# Patient Record
Sex: Female | Born: 1940
Health system: Southern US, Community
[De-identification: ages and names within clinical notes are randomized; demographics above are authoritative.]

## PROBLEM LIST (undated history)

## (undated) DIAGNOSIS — I10 Essential (primary) hypertension: Secondary | ICD-10-CM

## (undated) DIAGNOSIS — K922 Gastrointestinal hemorrhage, unspecified: Secondary | ICD-10-CM

## (undated) DIAGNOSIS — J189 Pneumonia, unspecified organism: Secondary | ICD-10-CM

## (undated) DIAGNOSIS — S42309A Unspecified fracture of shaft of humerus, unspecified arm, initial encounter for closed fracture: Secondary | ICD-10-CM

## (undated) DIAGNOSIS — I209 Angina pectoris, unspecified: Secondary | ICD-10-CM

## (undated) DIAGNOSIS — M1990 Unspecified osteoarthritis, unspecified site: Secondary | ICD-10-CM

## (undated) DIAGNOSIS — K449 Diaphragmatic hernia without obstruction or gangrene: Secondary | ICD-10-CM

## (undated) DIAGNOSIS — Z87442 Personal history of urinary calculi: Secondary | ICD-10-CM

## (undated) DIAGNOSIS — F419 Anxiety disorder, unspecified: Secondary | ICD-10-CM

## (undated) DIAGNOSIS — E039 Hypothyroidism, unspecified: Secondary | ICD-10-CM

## (undated) DIAGNOSIS — R7303 Prediabetes: Secondary | ICD-10-CM

## (undated) DIAGNOSIS — D649 Anemia, unspecified: Secondary | ICD-10-CM

## (undated) DIAGNOSIS — F32A Depression, unspecified: Secondary | ICD-10-CM

## (undated) DIAGNOSIS — E119 Type 2 diabetes mellitus without complications: Secondary | ICD-10-CM

## (undated) DIAGNOSIS — I2510 Atherosclerotic heart disease of native coronary artery without angina pectoris: Secondary | ICD-10-CM

## (undated) HISTORY — PX: EYE SURGERY: SHX253

## (undated) HISTORY — PX: TONSILLECTOMY: SUR1361

## (undated) HISTORY — PX: PARATHYROIDECTOMY: SHX19

## (undated) HISTORY — PX: ABDOMINAL HYSTERECTOMY: SHX81

---

## 1999-05-02 ENCOUNTER — Other Ambulatory Visit: Admission: RE | Admit: 1999-05-02 | Discharge: 1999-05-02 | Payer: Self-pay | Admitting: Obstetrics and Gynecology

## 2000-04-30 ENCOUNTER — Other Ambulatory Visit: Admission: RE | Admit: 2000-04-30 | Discharge: 2000-04-30 | Payer: Self-pay | Admitting: Obstetrics and Gynecology

## 2001-05-12 ENCOUNTER — Other Ambulatory Visit: Admission: RE | Admit: 2001-05-12 | Discharge: 2001-05-12 | Payer: Self-pay | Admitting: Obstetrics and Gynecology

## 2002-05-18 ENCOUNTER — Other Ambulatory Visit: Admission: RE | Admit: 2002-05-18 | Discharge: 2002-05-18 | Payer: Self-pay | Admitting: Obstetrics and Gynecology

## 2002-06-20 ENCOUNTER — Encounter: Payer: Self-pay | Admitting: Endocrinology

## 2002-06-20 ENCOUNTER — Ambulatory Visit (HOSPITAL_COMMUNITY): Admission: RE | Admit: 2002-06-20 | Discharge: 2002-06-20 | Payer: Self-pay | Admitting: Endocrinology

## 2003-07-06 ENCOUNTER — Other Ambulatory Visit: Admission: RE | Admit: 2003-07-06 | Discharge: 2003-07-06 | Payer: Self-pay | Admitting: Obstetrics and Gynecology

## 2003-12-31 ENCOUNTER — Ambulatory Visit (HOSPITAL_COMMUNITY): Admission: RE | Admit: 2003-12-31 | Discharge: 2003-12-31 | Payer: Self-pay | Admitting: Endocrinology

## 2004-01-17 ENCOUNTER — Ambulatory Visit (HOSPITAL_COMMUNITY): Admission: RE | Admit: 2004-01-17 | Discharge: 2004-01-17 | Payer: Self-pay | Admitting: Endocrinology

## 2004-03-13 ENCOUNTER — Encounter (INDEPENDENT_AMBULATORY_CARE_PROVIDER_SITE_OTHER): Payer: Self-pay | Admitting: *Deleted

## 2004-03-13 ENCOUNTER — Ambulatory Visit (HOSPITAL_COMMUNITY): Admission: RE | Admit: 2004-03-13 | Discharge: 2004-03-14 | Payer: Self-pay | Admitting: Surgery

## 2004-07-09 ENCOUNTER — Other Ambulatory Visit: Admission: RE | Admit: 2004-07-09 | Discharge: 2004-07-09 | Payer: Self-pay | Admitting: Obstetrics and Gynecology

## 2007-02-28 ENCOUNTER — Encounter: Admission: RE | Admit: 2007-02-28 | Discharge: 2007-02-28 | Payer: Self-pay | Admitting: Specialist

## 2007-07-18 ENCOUNTER — Other Ambulatory Visit: Admission: RE | Admit: 2007-07-18 | Discharge: 2007-07-18 | Payer: Self-pay | Admitting: Obstetrics and Gynecology

## 2008-05-15 ENCOUNTER — Encounter
Admission: RE | Admit: 2008-05-15 | Discharge: 2008-05-15 | Payer: Self-pay | Admitting: Physical Medicine and Rehabilitation

## 2010-12-19 NOTE — Op Note (Signed)
Monique [name], Monique [name]                       ACCOUNT NO.:  [id number]   MEDICAL RECORD NO.:  [id number]                   PATIENT TYPE:  OIB   LOCATION:  5713                                 FACILITY:  [facility]   PHYSICIAN:  Velora Heckler, M.D.                DATE OF BIRTH:  09-24-1940   DATE OF PROCEDURE:  DATE OF DISCHARGE:                                 OPERATIVE REPORT   DATE OF SURGERY:  March 13, 2004.   PREOPERATIVE DIAGNOSIS:  Primary hyperparathyroidism.   POSTOPERATIVE DIAGNOSIS:  Primary hyperparathyroidism.   PROCEDURE:  Minimally invasive parathyroidectomy (right inferior gland).   SURGEON:  Velora Heckler, MD.   ASSISTANT:  Anselm Pancoast. Zachery Dakins, MD.   ANESTHESIA:  Maren Beach, MD,  General.   ESTIMATED BLOOD LOSS:  Minimal.   PREPARATION:  Betadine.   COMPLICATIONS:  None.   INDICATIONS:  The patient is a 70 year old white female referred by Dr. Maryelizabeth Kaufmann with primary hyperparathyroidism.  The patient has had elevated intact  PTH level and serum calcium levels.  She has developed bone and joint pain.  She is on Actonel for osteoporosis.  She has elevated urine calcium levels.  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  MRI scan  confirms a 9-mm mass in the region of the thoracic inlet.  The patient now  comes to surgery for exploration.   The procedure is done in OR #16 at the [facility]  The  patient is brought to the operating room, placed in the supine position on  the operating room table.  Following the administration of general  anesthesia, the patient is positioned and then prepped and draped in the  usual strict aseptic fashion.  After ascertaining that an adequate level of  anesthesia had been obtained, an incision was made in the right inferior  neck.  Dissection was carried down through subcutaneous tissues and  platysma.  Hemostasis is obtained with the electrocautery.  Skin flaps are  developed  cephalad and caudad, and a Weitlaner retractor is placed for  exposure, strap muscles are incised in the midline and reflected laterally.  Inferior pole of the thyroid gland is identified.  The Neo-Probe is used for  detection of radioactivity; however, it does not appear to be terribly  directive in this instance.  Therefore, exploration is performed in the  tracheoesophageal groove.  A parathyroid adenoma is identified in the  thyrothymic tract.  The tract is divided superiorly.  It is then followed  beneath the right head of the clavicle into the anterior mediastinum.  It is  mobilized and transected with the electrocautery below the level of the  adenoma.  On excision, the parathyroid gland measures 1.9 x 0.9 x 0.9 cm in  size.  Frozen section biopsy confirms parathyroid tissue.   Good hemostasis is obtained in the right neck.  Surgicel is placed in the  area of resection.  Strap muscles are reapproximated in the midline with  interrupted 3-0 Vicryl sutures, the platysma closed with interrupted 3-0  Vicryl sutures.  The skin is anesthetized with local anesthetic.  The skin  is closed with a running 4-0 Vicryl subcuticular suture.  Wound is washed  and dried and Steri-Strips are applied.  Sterile dressings are applied.  The  patient is awakened from anesthesia and brought to the recovery room in good  condition.  The patient tolerated the procedure well.                                               Velora Heckler, M.D.    TMG/MEDQ  D:  03/13/2004  T:  03/13/2004  Job:  [id number]   cc:   Reather Littler, M.D.  [address], [address]  [place]  [place] [address]  Fax: [phone]   Maryelizabeth Rowan, M.D.  [facility]  [place], [place] [address]  Fax: [phone]

## 2011-04-02 ENCOUNTER — Other Ambulatory Visit: Payer: Self-pay | Admitting: Orthopedic Surgery

## 2011-04-02 ENCOUNTER — Ambulatory Visit
Admission: RE | Admit: 2011-04-02 | Discharge: 2011-04-02 | Disposition: A | Discharge status: Home / Self Care | Payer: Medicare Other | Source: Ambulatory Visit | Attending: Orthopedic Surgery | Admitting: Orthopedic Surgery

## 2011-04-02 DIAGNOSIS — T148XXA Other injury of unspecified body region, initial encounter: Secondary | ICD-10-CM

## 2011-06-29 ENCOUNTER — Encounter (HOSPITAL_COMMUNITY): Payer: Self-pay | Admitting: Pharmacy Technician

## 2011-06-30 ENCOUNTER — Other Ambulatory Visit: Payer: Self-pay | Admitting: Orthopedic Surgery

## 2011-06-30 NOTE — H&P (Signed)
Monique [name]  DOB: 08/19/1940  Date of Admission:  07/08/2011  Chief complaint:  Right Hip Pain  History of Present Illness The patient is a 70 year old female who comes in today for a preoperative History and Physical. The patient is scheduled for a right total hip arthroplasty to be performed by Dr. Frank V. Aluisio, MD at [facility] on 07/08/2011.  Allergies PENICILLIN. 02/17/2007  Intolerance  Lyrica - Did not help  Medication History Simvastatin (40MG Tablet, Oral at bedtime) Active. Lisinopril (20MG Tablet, Oral every morning) Active. ZyrTEC Allergy (10MG Tablet, Oral daily) Active. Norco (10-325MG Tablet, Oral every six hours, as needed) Active. Flector (1.3% Patch, Transdermal daily) Active. MiraLax ( Oral as needed) Active  Problem List/Past Medical Asthma Hypercholesterolemia Osteoarthritis Ulcer disease Osteoarthrosis shoulder Osteoarthritis Hip Hypertension  Past Surgical History Hysterectomy. complete (non-cancerous) Tonsillectomy Parathyroidectomy Colonoscpy Eyelid Surgery  Family History Cancer. mother Heart Disease. father Heart disease in female family member before age 55 Osteoporosis. Maternal Aunt.  Social History Alcohol use. current drinker; drinks beer, wine and hard liquor; only occasionally per week Children. 2 Current work status. working part time Living situation. live with spouse Marital status. married Tobacco use. never smoker  Review of Systems General:Not Present- Chills, Fever, Night Sweats, Fatigue, Weight Gain, Weight Loss and Memory Loss. Skin:Not Present- Hives, Itching, Rash, Eczema and Lesions. HEENT:Not Present- Tinnitus, Headache, Double Vision, Visual Loss, Hearing Loss and Dentures. Respiratory:Not Present- Shortness of breath with exertion, Shortness of breath at rest, Allergies, Coughing up blood and Chronic Cough. Cardiovascular:Not Present- Chest Pain,  Racing/skipping heartbeats, Difficulty Breathing Lying Down, Murmur, Swelling and Palpitations. Gastrointestinal:Not Present- Bloody Stool, Heartburn, Abdominal Pain, Vomiting, Nausea, Constipation, Diarrhea, Difficulty Swallowing, Jaundice and Loss of appetitie. Female Genitourinary:Present- Incontinence (Mild). Not Present- Blood in Urine, Urinary frequency, Weak urinary stream, Discharge, Flank Pain, Painful Urination, Urgency, Urinary Retention and Urinating at Night. Musculoskeletal:Not Present- Muscle Weakness, Muscle Pain, Joint Swelling, Joint Pain, Back Pain, Morning Stiffness and Spasms. Neurological:Not Present- Tremor, Dizziness, Blackout spells, Paralysis, Difficulty with balance and Weakness. Psychiatric:Not Present- Insomnia.  Vitals Weight: 167 lb Height: 67 in Body Surface Area: 1.89 m Body Mass Index: 26.16 kg/m Pulse: 88 (Regular) Resp.: 16 (Unlabored) BP: 126/82 (Sitting, Right Arm, Standard)  Physical Exam The physical exam findings are as follows:  General Mental Status - Alert, cooperative and good historian. General Appearance- pleasant. Not in acute distress. Orientation- Oriented X3. Build & Nutrition- Well nourished and Well developed.  Head and Neck Head- normocephalic, atraumatic . Neck Global Assessment- supple. no bruit auscultated on the right and no bruit auscultated on the left.  Eye Pupil- Bilateral- Regular and Round. Motion- Bilateral- EOMI.  Chest and Lung Exam Auscultation: Breath sounds:- clear at anterior chest wall and - clear at posterior chest wall. Adventitious sounds:- No Adventitious sounds.  Cardiovascular Auscultation:Rhythm- Regular rate and rhythm. Heart Sounds- S1 WNL and S2 WNL. Murmurs & Other Heart Sounds:Auscultation of the heart reveals - No Murmurs.  Abdomen Palpation/Percussion:Tenderness- Abdomen is non-tender to palpation. Rigidity (guarding)-   Abdomen is  soft. Auscultation:Auscultation of the abdomen reveals - Bowel sounds normal. Note: Slightly round, soft  Female Genitourinary Note: Not done, not pertinent to present illness  Musculoskeletal Note: Well-developed female, alert and oriented, in no apparent distress. She has a significantly antalgic gait pattern with a significant abductor lurch on the right. Right hip can be flexed to 90. No internal or external rotation. No abduction or adduction. Left hip has normal ROM. Right knee exam   is unremarkable. Pulses, sensation and motor are intact, both lower extremities.  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 She has excellent bone stock. She has a rather small acetabulum. She has protrusio, but it is not all the way into the pelvis.  Assessment & Plan Osteoarthritis Right Hip  Note: Plan is for a Right Total Hip Replacement by Dr. Aluisio. Plan is to go to home following surgey.  Drew Burlin Mcnair, PA-C   

## 2011-07-02 ENCOUNTER — Ambulatory Visit (HOSPITAL_COMMUNITY)
Admission: RE | Admit: 2011-07-02 | Discharge: 2011-07-02 | Disposition: A | Discharge status: Home / Self Care | Payer: Medicare Other | Source: Ambulatory Visit | Attending: Orthopedic Surgery | Admitting: Orthopedic Surgery

## 2011-07-02 ENCOUNTER — Encounter (HOSPITAL_COMMUNITY)
Admission: RE | Admit: 2011-07-02 | Discharge: 2011-07-02 | Disposition: A | Discharge status: Home / Self Care | Payer: Medicare Other | Source: Ambulatory Visit | Attending: Orthopedic Surgery | Admitting: Orthopedic Surgery

## 2011-07-02 ENCOUNTER — Encounter (HOSPITAL_COMMUNITY): Payer: Self-pay

## 2011-07-02 DIAGNOSIS — M25559 Pain in unspecified hip: Secondary | ICD-10-CM | POA: Insufficient documentation

## 2011-07-02 DIAGNOSIS — M1610 Unilateral primary osteoarthritis, unspecified hip: Secondary | ICD-10-CM | POA: Insufficient documentation

## 2011-07-02 DIAGNOSIS — Z01818 Encounter for other preprocedural examination: Secondary | ICD-10-CM | POA: Insufficient documentation

## 2011-07-02 DIAGNOSIS — Z01812 Encounter for preprocedural laboratory examination: Secondary | ICD-10-CM | POA: Insufficient documentation

## 2011-07-02 DIAGNOSIS — M169 Osteoarthritis of hip, unspecified: Secondary | ICD-10-CM | POA: Insufficient documentation

## 2011-07-02 DIAGNOSIS — M948X9 Other specified disorders of cartilage, unspecified sites: Secondary | ICD-10-CM | POA: Insufficient documentation

## 2011-07-02 HISTORY — DX: Anemia, unspecified: D64.9

## 2011-07-02 HISTORY — DX: Unspecified osteoarthritis, unspecified site: M19.90

## 2011-07-02 HISTORY — DX: Essential (primary) hypertension: I10

## 2011-07-02 HISTORY — DX: Pneumonia, unspecified organism: J18.9

## 2011-07-02 HISTORY — DX: Diaphragmatic hernia without obstruction or gangrene: K44.9

## 2011-07-02 LAB — APTT: aPTT: 32 seconds (ref 24–37)

## 2011-07-02 LAB — SURGICAL PCR SCREEN: MRSA, PCR: NEGATIVE

## 2011-07-02 MED ORDER — CHLORHEXIDINE GLUCONATE 4 % EX LIQD: 60.0000 mL | Freq: Once | CUTANEOUS | Status: DC

## 2011-07-02 NOTE — Pre-Procedure Instructions (Addendum)
Labs from 06/18/11 PCP and EKG on chart

## 2011-07-02 NOTE — Patient Instructions (Signed)
20 Monique [name]  07/02/2011   Your procedure is scheduled on: 07/08/11  Wednesday  Surgery [id number]  Report to [facility] at  0600 AM.  Call this number if you have problems the morning of surgery: [phone]              PST  [name] [id number]  Remember:   Do not eat food:After Midnight.  Tuesday night  May have clear liquids:until Midnight   Tuesday NIGHT.  Clear liquids include soda, tea, black coffee, apple or grape juice, broth.  Take these medicines the morning of surgery with A SIP OF WATER:  Norco if needed with sip water   Do not wear jewelry, make-up or nail polish.  Do not wear lotions, powders, or perfumes. You may wear deodorant.  Do not shave 48 hours prior to surgery.  Do not bring valuables to the hospital.  Contacts, dentures or bridgework may not be worn into surgery.  Leave suitcase in the car. After surgery it may be brought to your room.  For patients admitted to the hospital, checkout time is 11:00 AM the day of discharge.   Patients discharged the day of surgery will not be allowed to drive home.  Name and phone number of your driver: husband  Special Instructions: CHG Shower Use Special Wash: 1/2 bottle night before surgery and 1/2 bottle morning of surgery.Regular soap face or privates  No shaving x 48 hrs   Please read over the following fact sheets that you were given: MRSA Information

## 2011-07-07 MED ORDER — BUPIVACAINE 0.25 % ON-Q PUMP SINGLE CATH 300ML
300.0000 mL | INJECTION | Status: DC
  Filled 2011-07-07: qty 300

## 2011-07-07 NOTE — Anesthesia Preprocedure Evaluation (Addendum)
Anesthesia Evaluation  Patient identified by MRN, date of birth, ID band Patient awake    Reviewed: Allergy & Precautions, H&P , NPO status , Patient's Chart, lab work & pertinent test results  Airway Mallampati: II TM Distance: >3 FB Neck ROM: full    Dental No notable dental hx. (+) Teeth Intact and Dental Advisory Given   Pulmonary neg pulmonary ROS, asthma ,  Asthma very controlled clear to auscultation  Pulmonary exam normal       Cardiovascular Exercise Tolerance: Good hypertension, On Medications neg cardio ROS regular Normal    Neuro/Psych Negative Neurological ROS  Negative Psych ROS   GI/Hepatic negative GI ROS, Neg liver ROS, hiatal hernia,   Endo/Other  Negative Endocrine ROS  Renal/GU negative Renal ROS  Genitourinary negative   Musculoskeletal   Abdominal   Peds  Hematology negative hematology ROS (+)   Anesthesia Other Findings   Reproductive/Obstetrics negative OB ROS                          Anesthesia Physical Anesthesia Plan  ASA: III  Anesthesia Plan: General   Post-op Pain Management:    Induction: Intravenous  Airway Management Planned: Oral ETT  Additional Equipment:   Intra-op Plan:   Post-operative Plan: Extubation in OR  Informed Consent: I have reviewed the patients History and Physical, chart, labs and discussed the procedure including the risks, benefits and alternatives for the proposed anesthesia with the patient or authorized representative who has indicated his/her understanding and acceptance.   Dental Advisory Given  Plan Discussed with: CRNA and Surgeon  Anesthesia Plan Comments:        Anesthesia Quick Evaluation

## 2011-07-08 ENCOUNTER — Inpatient Hospital Stay (HOSPITAL_COMMUNITY): Payer: Medicare Other

## 2011-07-08 ENCOUNTER — Encounter (HOSPITAL_COMMUNITY)
Admission: RE | Disposition: A | Discharge status: Home Health | Payer: Self-pay | Source: Ambulatory Visit | Attending: Orthopedic Surgery

## 2011-07-08 ENCOUNTER — Encounter (HOSPITAL_COMMUNITY): Payer: Self-pay | Admitting: Anesthesiology

## 2011-07-08 ENCOUNTER — Encounter (HOSPITAL_COMMUNITY): Payer: Self-pay | Admitting: Orthopedic Surgery

## 2011-07-08 ENCOUNTER — Inpatient Hospital Stay (HOSPITAL_COMMUNITY)
Admission: RE | Admit: 2011-07-08 | Discharge: 2011-07-11 | DRG: 470 | Disposition: A | Discharge status: Home Health | Payer: Medicare Other | Source: Ambulatory Visit | Attending: Orthopedic Surgery | Admitting: Orthopedic Surgery

## 2011-07-08 ENCOUNTER — Inpatient Hospital Stay (HOSPITAL_COMMUNITY): Payer: Medicare Other | Admitting: Anesthesiology

## 2011-07-08 DIAGNOSIS — K449 Diaphragmatic hernia without obstruction or gangrene: Secondary | ICD-10-CM | POA: Diagnosis present

## 2011-07-08 DIAGNOSIS — M1611 Unilateral primary osteoarthritis, right hip: Secondary | ICD-10-CM | POA: Diagnosis present

## 2011-07-08 DIAGNOSIS — M169 Osteoarthritis of hip, unspecified: Principal | ICD-10-CM | POA: Diagnosis present

## 2011-07-08 DIAGNOSIS — I10 Essential (primary) hypertension: Secondary | ICD-10-CM | POA: Diagnosis present

## 2011-07-08 DIAGNOSIS — E876 Hypokalemia: Secondary | ICD-10-CM | POA: Diagnosis not present

## 2011-07-08 DIAGNOSIS — M1610 Unilateral primary osteoarthritis, unspecified hip: Principal | ICD-10-CM | POA: Diagnosis present

## 2011-07-08 HISTORY — PX: TOTAL HIP ARTHROPLASTY: SHX124

## 2011-07-08 LAB — ABO/RH: ABO/RH(D): A POS

## 2011-07-08 LAB — TYPE AND SCREEN

## 2011-07-08 SURGERY — ARTHROPLASTY, HIP, TOTAL,POSTERIOR APPROACH
Anesthesia: General | Site: Hip | Laterality: Right | Wound class: Clean

## 2011-07-08 MED ORDER — LACTATED RINGERS IV SOLN
INTRAVENOUS | Status: DC | PRN
  Administered 2011-07-08: 1000 mL
  Administered 2011-07-08: 09:00:00 via INTRAVENOUS

## 2011-07-08 MED ORDER — MENTHOL 3 MG MT LOZG: 1.0000 | LOZENGE | OROMUCOSAL | Status: DC | PRN

## 2011-07-08 MED ORDER — DOCUSATE SODIUM 100 MG PO CAPS
100.0000 mg | ORAL_CAPSULE | Freq: Two times a day (BID) | ORAL | Status: DC
  Administered 2011-07-09 – 2011-07-11 (×6): 100 mg via ORAL
  Filled 2011-07-08 (×8): qty 1

## 2011-07-08 MED ORDER — ALUM & MAG HYDROXIDE-SIMETH 200-200-20 MG/5ML PO SUSP
30.0000 mL | Freq: Four times a day (QID) | ORAL | Status: DC | PRN
  Administered 2011-07-08: 30 mL via ORAL
  Filled 2011-07-08: qty 30

## 2011-07-08 MED ORDER — SUCCINYLCHOLINE CHLORIDE 20 MG/ML IJ SOLN
INTRAMUSCULAR | Status: DC | PRN
  Administered 2011-07-08: 100 mg via INTRAVENOUS

## 2011-07-08 MED ORDER — FENTANYL CITRATE 0.05 MG/ML IJ SOLN
50.0000 ug | INTRAMUSCULAR | Status: DC | PRN
  Administered 2011-07-08: 25 ug via INTRAVENOUS

## 2011-07-08 MED ORDER — ONDANSETRON HCL 4 MG PO TABS
4.0000 mg | ORAL_TABLET | Freq: Four times a day (QID) | ORAL | Status: DC | PRN
  Administered 2011-07-08 – 2011-07-09 (×2): 4 mg via ORAL
  Filled 2011-07-08 (×2): qty 1

## 2011-07-08 MED ORDER — FENTANYL CITRATE 0.05 MG/ML IJ SOLN
INTRAMUSCULAR | Status: DC | PRN
Start: 2011-07-08 — End: 2011-07-08
  Administered 2011-07-08 (×3): 50 ug via INTRAVENOUS
  Administered 2011-07-08 (×3): 25 ug via INTRAVENOUS
  Administered 2011-07-08: 50 ug via INTRAVENOUS
  Administered 2011-07-08: 75 ug via INTRAVENOUS
  Administered 2011-07-08 (×2): 50 ug via INTRAVENOUS
  Administered 2011-07-08: 25 ug
  Administered 2011-07-08: 50 ug via INTRAVENOUS
  Administered 2011-07-08: 75 ug via INTRAVENOUS

## 2011-07-08 MED ORDER — MIDAZOLAM HCL 5 MG/5ML IJ SOLN
INTRAMUSCULAR | Status: DC | PRN
  Administered 2011-07-08: 1 mg via INTRAVENOUS

## 2011-07-08 MED ORDER — ACETAMINOPHEN 325 MG PO TABS: 650.0000 mg | ORAL_TABLET | Freq: Four times a day (QID) | ORAL | Status: DC | PRN

## 2011-07-08 MED ORDER — METOCLOPRAMIDE HCL 10 MG PO TABS: 5.0000 mg | ORAL_TABLET | Freq: Three times a day (TID) | ORAL | Status: DC | PRN

## 2011-07-08 MED ORDER — METOCLOPRAMIDE HCL 5 MG/ML IJ SOLN
5.0000 mg | Freq: Three times a day (TID) | INTRAMUSCULAR | Status: DC | PRN
  Administered 2011-07-08: 10 mg via INTRAVENOUS
  Filled 2011-07-08: qty 2

## 2011-07-08 MED ORDER — RIVAROXABAN 10 MG PO TABS
10.0000 mg | ORAL_TABLET | Freq: Every day | ORAL | Status: DC
  Administered 2011-07-09 – 2011-07-11 (×3): 10 mg via ORAL
  Filled 2011-07-08 (×3): qty 1

## 2011-07-08 MED ORDER — METOPROLOL TARTRATE 1 MG/ML IV SOLN
2.5000 mg | INTRAVENOUS | Status: AC
  Administered 2011-07-08: 2.5 mg via INTRAVENOUS

## 2011-07-08 MED ORDER — LIDOCAINE HCL (CARDIAC) 20 MG/ML IV SOLN
INTRAVENOUS | Status: DC | PRN
  Administered 2011-07-08: 20 mg via INTRAVENOUS

## 2011-07-08 MED ORDER — PROMETHAZINE HCL 25 MG/ML IJ SOLN: 6.2500 mg | INTRAMUSCULAR | Status: DC | PRN

## 2011-07-08 MED ORDER — METHOCARBAMOL 100 MG/ML IJ SOLN
500.0000 mg | Freq: Four times a day (QID) | INTRAVENOUS | Status: DC | PRN
  Administered 2011-07-08: 500 mg via INTRAVENOUS
  Filled 2011-07-08: qty 5

## 2011-07-08 MED ORDER — KETAMINE HCL 10 MG/ML IJ SOLN
INTRAMUSCULAR | Status: DC | PRN
  Administered 2011-07-08 (×4): 5 mg via INTRAVENOUS

## 2011-07-08 MED ORDER — PROPOFOL 10 MG/ML IV EMUL
INTRAVENOUS | Status: DC | PRN
  Administered 2011-07-08: 150 mg via INTRAVENOUS

## 2011-07-08 MED ORDER — NEOSTIGMINE METHYLSULFATE 1 MG/ML IJ SOLN
INTRAMUSCULAR | Status: DC | PRN
  Administered 2011-07-08: 3 mg via INTRAVENOUS

## 2011-07-08 MED ORDER — TEMAZEPAM 15 MG PO CAPS: 15.0000 mg | ORAL_CAPSULE | Freq: Every evening | ORAL | Status: DC | PRN

## 2011-07-08 MED ORDER — ESMOLOL HCL 10 MG/ML IV SOLN
INTRAVENOUS | Status: DC | PRN
  Administered 2011-07-08: 20 mg via INTRAVENOUS
  Administered 2011-07-08 (×2): 5 mg via INTRAVENOUS
  Administered 2011-07-08: 20 mg via INTRAVENOUS
  Administered 2011-07-08: 5 mg via INTRAVENOUS

## 2011-07-08 MED ORDER — HYDROMORPHONE HCL PF 1 MG/ML IJ SOLN
0.2500 mg | INTRAMUSCULAR | Status: DC | PRN
  Administered 2011-07-08 (×3): 0.5 mg via INTRAVENOUS

## 2011-07-08 MED ORDER — LORATADINE 10 MG PO TABS
10.0000 mg | ORAL_TABLET | Freq: Every day | ORAL | Status: DC
  Administered 2011-07-09 – 2011-07-11 (×3): 10 mg via ORAL
  Filled 2011-07-08 (×4): qty 1

## 2011-07-08 MED ORDER — ACETAMINOPHEN 10 MG/ML IV SOLN
INTRAVENOUS | Status: DC | PRN
  Administered 2011-07-08: 1000 mg via INTRAVENOUS

## 2011-07-08 MED ORDER — ACETAMINOPHEN 10 MG/ML IV SOLN
1000.0000 mg | Freq: Four times a day (QID) | INTRAVENOUS | Status: AC
  Administered 2011-07-08 – 2011-07-09 (×3): 1000 mg via INTRAVENOUS
  Filled 2011-07-08 (×3): qty 100

## 2011-07-08 MED ORDER — VANCOMYCIN HCL IN DEXTROSE 1-5 GM/200ML-% IV SOLN
1000.0000 mg | Freq: Two times a day (BID) | INTRAVENOUS | Status: AC
  Administered 2011-07-08: 1000 mg via INTRAVENOUS
  Filled 2011-07-08: qty 200

## 2011-07-08 MED ORDER — LACTATED RINGERS IV SOLN: INTRAVENOUS | Status: DC

## 2011-07-08 MED ORDER — METHOCARBAMOL 500 MG PO TABS
500.0000 mg | ORAL_TABLET | Freq: Four times a day (QID) | ORAL | Status: DC | PRN
  Administered 2011-07-09: 500 mg via ORAL
  Filled 2011-07-08: qty 1

## 2011-07-08 MED ORDER — HYDROMORPHONE HCL 2 MG PO TABS
2.0000 mg | ORAL_TABLET | ORAL | Status: DC | PRN
  Administered 2011-07-08: 2 mg via ORAL
  Administered 2011-07-09: 4 mg via ORAL
  Administered 2011-07-09: 2 mg via ORAL
  Administered 2011-07-09 (×2): 4 mg via ORAL
  Administered 2011-07-09: 2 mg via ORAL
  Administered 2011-07-10 – 2011-07-11 (×9): 4 mg via ORAL
  Filled 2011-07-08: qty 2
  Filled 2011-07-08: qty 1
  Filled 2011-07-08 (×2): qty 2
  Filled 2011-07-08: qty 1
  Filled 2011-07-08 (×4): qty 2
  Filled 2011-07-08: qty 1
  Filled 2011-07-08 (×6): qty 2

## 2011-07-08 MED ORDER — ACETAMINOPHEN 650 MG RE SUPP: 650.0000 mg | Freq: Four times a day (QID) | RECTAL | Status: DC | PRN

## 2011-07-08 MED ORDER — CISATRACURIUM BESYLATE 2 MG/ML IV SOLN
INTRAVENOUS | Status: DC | PRN
  Administered 2011-07-08: 5 mg via INTRAVENOUS

## 2011-07-08 MED ORDER — LACTATED RINGERS IV SOLN
INTRAVENOUS | Status: DC | PRN
  Administered 2011-07-08: 08:00:00 via INTRAVENOUS

## 2011-07-08 MED ORDER — DIPHENHYDRAMINE HCL 12.5 MG/5ML PO ELIX: 12.5000 mg | ORAL_SOLUTION | ORAL | Status: DC | PRN

## 2011-07-08 MED ORDER — SIMVASTATIN 40 MG PO TABS
40.0000 mg | ORAL_TABLET | Freq: Every day | ORAL | Status: DC
  Administered 2011-07-09 – 2011-07-10 (×2): 40 mg via ORAL
  Filled 2011-07-08 (×4): qty 1

## 2011-07-08 MED ORDER — SODIUM CHLORIDE 0.9 % IR SOLN
Status: DC | PRN
  Administered 2011-07-08: 1000 mL

## 2011-07-08 MED ORDER — HETASTARCH-ELECTROLYTES 6 % IV SOLN
INTRAVENOUS | Status: DC | PRN
  Administered 2011-07-08: 09:00:00 via INTRAVENOUS

## 2011-07-08 MED ORDER — MORPHINE SULFATE 2 MG/ML IJ SOLN
1.0000 mg | INTRAMUSCULAR | Status: DC | PRN
  Administered 2011-07-08 – 2011-07-09 (×4): 2 mg via INTRAVENOUS
  Filled 2011-07-08 (×4): qty 1

## 2011-07-08 MED ORDER — GLYCOPYRROLATE 0.2 MG/ML IJ SOLN
INTRAMUSCULAR | Status: DC | PRN
  Administered 2011-07-08: .4 mg via INTRAVENOUS

## 2011-07-08 MED ORDER — ONDANSETRON HCL 4 MG/2ML IJ SOLN
4.0000 mg | Freq: Four times a day (QID) | INTRAMUSCULAR | Status: DC | PRN
  Administered 2011-07-08: 4 mg via INTRAVENOUS
  Filled 2011-07-08: qty 2

## 2011-07-08 MED ORDER — SODIUM CHLORIDE 0.9 % IV SOLN
INTRAVENOUS | Status: DC
  Administered 2011-07-08 – 2011-07-09 (×4): via INTRAVENOUS

## 2011-07-08 MED ORDER — HYDROMORPHONE HCL PF 1 MG/ML IJ SOLN
0.2500 mg | INTRAMUSCULAR | Status: DC | PRN
  Administered 2011-07-08: 0.5 mg via INTRAVENOUS

## 2011-07-08 MED ORDER — VANCOMYCIN HCL IN DEXTROSE 1-5 GM/200ML-% IV SOLN
1000.0000 mg | Freq: Once | INTRAVENOUS | Status: AC
  Administered 2011-07-08: 1000 mg via INTRAVENOUS
  Filled 2011-07-08: qty 200

## 2011-07-08 MED ORDER — PHENOL 1.4 % MT LIQD: 1.0000 | OROMUCOSAL | Status: DC | PRN

## 2011-07-08 MED ORDER — BUPIVACAINE LIPOSOME 1.3 % IJ SUSP
20.0000 mL | INTRAMUSCULAR | Status: AC
  Administered 2011-07-08: 20 mL
  Filled 2011-07-08: qty 20

## 2011-07-08 SURGICAL SUPPLY — 50 items
BAG ZIPLOCK 12X15 (MISCELLANEOUS) ×2 IMPLANT
BIT DRILL 2.8X128 (BIT) ×2 IMPLANT
BLADE EXTENDED COATED 6.5IN (ELECTRODE) ×2 IMPLANT
BLADE SAW SAG 73X25 THK (BLADE) ×1
BLADE SAW SGTL 73X25 THK (BLADE) ×1 IMPLANT
CLOSURE STERI STRIP 1/2 X4 (GAUZE/BANDAGES/DRESSINGS) ×2 IMPLANT
CLOTH BEACON ORANGE TIMEOUT ST (SAFETY) ×2 IMPLANT
DRAPE INCISE IOBAN 66X45 STRL (DRAPES) ×2 IMPLANT
DRAPE ORTHO SPLIT 77X108 STRL (DRAPES) ×2
DRAPE POUCH INSTRU U-SHP 10X18 (DRAPES) ×2 IMPLANT
DRAPE SURG ORHT 6 SPLT 77X108 (DRAPES) ×2 IMPLANT
DRAPE U-SHAPE 47X51 STRL (DRAPES) ×2 IMPLANT
DRSG ADAPTIC 3X8 NADH LF (GAUZE/BANDAGES/DRESSINGS) ×2 IMPLANT
DRSG MEPILEX BORDER 4X4 (GAUZE/BANDAGES/DRESSINGS) ×2 IMPLANT
DRSG MEPILEX BORDER 4X8 (GAUZE/BANDAGES/DRESSINGS) ×2 IMPLANT
DURAPREP 26ML APPLICATOR (WOUND CARE) ×2 IMPLANT
ELECT REM PT RETURN 9FT ADLT (ELECTROSURGICAL) ×2
ELECTRODE REM PT RTRN 9FT ADLT (ELECTROSURGICAL) ×1 IMPLANT
EVACUATOR 1/8 PVC DRAIN (DRAIN) ×2 IMPLANT
FACESHIELD LNG OPTICON STERILE (SAFETY) ×8 IMPLANT
GLOVE BIO SURGEON STRL SZ7.5 (GLOVE) ×2 IMPLANT
GLOVE BIO SURGEON STRL SZ8 (GLOVE) ×2 IMPLANT
GLOVE BIOGEL PI IND STRL 8 (GLOVE) ×2 IMPLANT
GLOVE BIOGEL PI INDICATOR 8 (GLOVE) ×2
GOWN STRL NON-REIN LRG LVL3 (GOWN DISPOSABLE) ×2 IMPLANT
GOWN STRL REIN XL XLG (GOWN DISPOSABLE) ×2 IMPLANT
IMMOBILIZER KNEE 20 (SOFTGOODS) ×2
IMMOBILIZER KNEE 20 THIGH 36 (SOFTGOODS) ×1 IMPLANT
KIT BASIN OR (CUSTOM PROCEDURE TRAY) ×2 IMPLANT
MANIFOLD NEPTUNE II (INSTRUMENTS) ×2 IMPLANT
NDL SAFETY ECLIPSE 18X1.5 (NEEDLE) ×1 IMPLANT
NEEDLE HYPO 18GX1.5 SHARP (NEEDLE) ×1
NS IRRIG 1000ML POUR BTL (IV SOLUTION) ×2 IMPLANT
PACK TOTAL JOINT (CUSTOM PROCEDURE TRAY) ×2 IMPLANT
PASSER SUT SWANSON 36MM LOOP (INSTRUMENTS) ×2 IMPLANT
POSITIONER SURGICAL ARM (MISCELLANEOUS) ×2 IMPLANT
SPONGE GAUZE 4X4 12PLY (GAUZE/BANDAGES/DRESSINGS) ×2 IMPLANT
SPONGE GAUZE 4X4 FOR O.R. (GAUZE/BANDAGES/DRESSINGS) ×2 IMPLANT
STRIP CLOSURE SKIN 1/2X4 (GAUZE/BANDAGES/DRESSINGS) ×4 IMPLANT
SUT ETHIBOND NAB CT1 #1 30IN (SUTURE) ×4 IMPLANT
SUT MNCRL AB 4-0 PS2 18 (SUTURE) ×2 IMPLANT
SUT VIC AB 1 CT1 27 (SUTURE) ×3
SUT VIC AB 1 CT1 27XBRD ANTBC (SUTURE) ×3 IMPLANT
SUT VIC AB 2-0 CT1 27 (SUTURE) ×3
SUT VIC AB 2-0 CT1 TAPERPNT 27 (SUTURE) ×3 IMPLANT
SYR 50ML LL SCALE MARK (SYRINGE) ×2 IMPLANT
TOWEL OR 17X26 10 PK STRL BLUE (TOWEL DISPOSABLE) ×4 IMPLANT
TOWEL OR NON WOVEN STRL DISP B (DISPOSABLE) ×2 IMPLANT
TRAY FOLEY CATH 14FRSI W/METER (CATHETERS) ×2 IMPLANT
WATER STERILE IRR 1500ML POUR (IV SOLUTION) ×2 IMPLANT

## 2011-07-08 NOTE — Transfer of Care (Signed)
Immediate Anesthesia Transfer of Care Note  Patient: Monique [name]  Procedure(s) Performed:  TOTAL HIP ARTHROPLASTY  Patient Location: PACU  Anesthesia Type: General  Level of Consciousness: awake  Airway & Oxygen Therapy: Patient Spontanous Breathing and Patient connected to face mask  Post-op Assessment: Report given to PACU RN and Post -op Vital signs reviewed and stable  Post vital signs: Reviewed and stable  Complications: No apparent anesthesia complications

## 2011-07-08 NOTE — Op Note (Signed)
Pre-operative diagnosis- Osteoarthritis Right hip  Post-operative diagnosis- Osteoarthritis  Right hip  Procedure-  RightTotal Hip Arthroplasty  Surgeon- Monique Rankin. Malaya Cagley, MD  Assistant- Avel Peace, PA-C   Anesthesia  General  EBL- 400   Drain Hemovac   Complication- None  Condition-PACU - hemodynamically stable.   Brief Clinical Note-  Monique [name] is a 70 y.o. female with end stage arthritis of her right hip with progressively worsening pain and dysfunction. Pain occurs with activity and rest including pain at night. She has tried analgesics, protected weight bearing and rest without benefit. Pain is too severe to attempt physical therapy. Radiographs demonstrate bone on bone arthritis with subchondral cyst formation. She presents now for right THA.  Procedure in detail-   The patient is brought into the operating room and placed on the operating table. After successful administration of General  anesthesia, the patient is placed in the  Left lateral decubitus position with the  Right side up and held in place with the hip positioner. The lower extremity is isolated from the perineum with plastic drapes and time-out is performed by the surgical team. The lower extremity is then prepped and draped in the usual sterile fashion. A short posterolateral incision is made with a ten blade through the subcutaneous tissue to the level of the fascia lata which is incised in line with the skin incision. The sciatic nerve is palpated and protected and the short external rotators and capsule are isolated from the femur. The hip is then dislocated and the center of the femoral head is marked. A trial prosthesis is placed such that the trial head corresponds to the center of the patients' native femoral head. The resection level is marked on the femoral neck and the resection is made with an oscillating saw. The femoral head is removed and femoral retractors placed to gain access to the femoral  canal.      The canal finder is passed into the femoral canal and the canal is thoroughly irrigated with sterile saline to remove the fatty contents. Axial reaming is performed to 11.5  mm, proximal reaming to 16D  and the sleeve machined to a small. A 16D small trial sleeve is placed into the proximal femur.      The femur is then retracted anteriorly to gain acetabular exposure. Acetabular retractors are placed and the labrum and osteophytes are removed, Acetabular reaming is performed to 49  mm and a 50  mm Pinnacle acetabular shell is placed in anatomic position with excellent purchase. Additional dome screws were placed. An apex hole eliminator is placed and the permanent 32 mm neutral plus 4 Marathon liner is placed into the acetabular shell.      The trial femur is then placed into the femoral canal. The size is 16 x 11  stem with a 36 + 6  neck and a 32 + 0 head with the neck version matching  the patients' native anteversion. The hip is reduced with excellent stability with full extension and full external rotation, 70 degrees flexion with 40 degrees adduction and 90 degrees internal rotation and 90 degrees of flexion with 70 degrees of internal rotation. The operative leg is placed on top of the non-operative leg and the leg lengths are found to be equal. The trials are then removed and the permanent implant of the same size is impacted into the femoral canal. The ceramic femoral head of the same size as the trial is placed and the hip is  reduced with the same stability parameters. The operative leg is again placed on top of the non-operative leg and the leg lengths are found to be equal.      The wound is then copiously irrigated with saline solution and the capsule and short external rotators are re-attached to the femur through drill holes with Ethibond suture. The fascia lata is closed over a hemovac drain with #1 vicryl suture and the fascia lata, gluteal muscles and subcutaneous tissues are  injected with Exparel 20ml diluted with saline 50ml. The subcutaneous tissues are closed with #1 and2-0 vicryl and the subcuticular layer closed with running 4-0 Monocryl. The drain is hooked to suction, incision cleaned and dried, and steri-srips and a bulky sterile dressing applied. The limb is placed into a knee immobilizer and the patient is awakened and transported to recovery in stable condition.      Please note that a surgical assistant was a medical necessity for this procedure in order to perform it in a safe and expeditious manner. The assistant was necessary to provide retraction to the vital neurovascular structures and to retract and position the limb to allow for anatomic placement of the prosthetic components.  Monique Rankin Natale Thoma, MD    07/08/2011, 9:42 AM

## 2011-07-08 NOTE — Anesthesia Postprocedure Evaluation (Signed)
  Anesthesia Post-op Note  Patient: Monique [name]  Procedure(s) Performed:  TOTAL HIP ARTHROPLASTY  Patient Location: PACU  Anesthesia Type: General  Level of Consciousness: awake and alert   Airway and Oxygen Therapy: Patient Spontanous Breathing  Post-op Pain: mild  Post-op Assessment: Post-op Vital signs reviewed, Patient's Cardiovascular Status Stable, Respiratory Function Stable, Patent Airway and No signs of Nausea or vomiting  Post-op Vital Signs: stable  Complications: No apparent anesthesia complications

## 2011-07-08 NOTE — Progress Notes (Signed)
DR EWELL   CAME TO UNIT AFTER PAGE:  NOTIFIED OF HEART RATE  115-125  BP 175/70--NOT RELIEVED WITH PAIN MEDICATION.  PAIN RELIEF NOT ACHEIEVED WITH 2MG  DILAUDID AND 500 ROBAXIN-ORDERS RECEIVED.

## 2011-07-08 NOTE — Interval H&P Note (Signed)
History and Physical Interval Note:  07/08/2011 7:57 AM  Monique [name]  has presented today for surgery, with the diagnosis of Osteoarthritis Right Hip  The various methods of treatment have been discussed with the patient and family. After consideration of risks, benefits and other options for treatment, the patient has consented to  Procedure(s): TOTAL HIP ARTHROPLASTY as a surgical intervention .  The patients' history has been reviewed, patient examined, no change in status, stable for surgery.  I have reviewed the patients' chart and labs.  Questions were answered to the patient's satisfaction.     Loanne Drilling

## 2011-07-08 NOTE — H&P (View-Only) (Signed)
Monique [name]  DOB: 09-Aug-1940  Date of Admission:  07/08/2011  Chief complaint:  Right Hip Pain  History of Present Illness The patient is a 70 year old female who comes in today for a preoperative History and Physical. The patient is scheduled for a right total hip arthroplasty to be performed by Dr. Gus Rankin. Aluisio, MD at [facility] on 07/08/2011.  Allergies PENICILLIN. 02/17/2007  Intolerance  Lyrica - Did not help  Medication History Simvastatin (40MG  Tablet, Oral at bedtime) Active. Lisinopril (20MG  Tablet, Oral every morning) Active. ZyrTEC Allergy (10MG  Tablet, Oral daily) Active. Norco (10-325MG  Tablet, Oral every six hours, as needed) Active. Flector (1.3% Patch, Transdermal daily) Active. MiraLax ( Oral as needed) Active  Problem List/Past Medical Asthma Hypercholesterolemia Osteoarthritis Ulcer disease Osteoarthrosis shoulder Osteoarthritis Hip Hypertension  Past Surgical History Hysterectomy. complete (non-cancerous) Tonsillectomy Parathyroidectomy Colonoscpy Eyelid Surgery  Family History Cancer. mother Heart Disease. father Heart disease in female family member before age 23 Osteoporosis. Maternal Aunt.  Social History Alcohol use. current drinker; drinks beer, wine and hard liquor; only occasionally per week Children. 2 Current work status. working part time Living situation. live with spouse Marital status. married Tobacco use. never smoker  Review of Systems General:Not Present- Chills, Fever, Night Sweats, Fatigue, Weight Gain, Weight Loss and Memory Loss. Skin:Not Present- Hives, Itching, Rash, Eczema and Lesions. HEENT:Not Present- Tinnitus, Headache, Double Vision, Visual Loss, Hearing Loss and Dentures. Respiratory:Not Present- Shortness of breath with exertion, Shortness of breath at rest, Allergies, Coughing up blood and Chronic Cough. Cardiovascular:Not Present- Chest Pain,  Racing/skipping heartbeats, Difficulty Breathing Lying Down, Murmur, Swelling and Palpitations. Gastrointestinal:Not Present- Bloody Stool, Heartburn, Abdominal Pain, Vomiting, Nausea, Constipation, Diarrhea, Difficulty Swallowing, Jaundice and Loss of appetitie. Female Genitourinary:Present- Incontinence (Mild). Not Present- Blood in Urine, Urinary frequency, Weak urinary stream, Discharge, Flank Pain, Painful Urination, Urgency, Urinary Retention and Urinating at Night. Musculoskeletal:Not Present- Muscle Weakness, Muscle Pain, Joint Swelling, Joint Pain, Back Pain, Morning Stiffness and Spasms. Neurological:Not Present- Tremor, Dizziness, Blackout spells, Paralysis, Difficulty with balance and Weakness. Psychiatric:Not Present- Insomnia.  Vitals Weight: 167 lb Height: 67 in Body Surface Area: 1.89 m Body Mass Index: 26.16 kg/m Pulse: 88 (Regular) Resp.: 16 (Unlabored) BP: 126/82 (Sitting, Right Arm, Standard)  Physical Exam The physical exam findings are as follows:  General Mental Status - Alert, cooperative and good historian. General Appearance- pleasant. Not in acute distress. Orientation- Oriented X3. Build & Nutrition- Well nourished and Well developed.  Head and Neck Head- normocephalic, atraumatic . Neck Global Assessment- supple. no bruit auscultated on the right and no bruit auscultated on the left.  Eye Pupil- Bilateral- Regular and Round. Motion- Bilateral- EOMI.  Chest and Lung Exam Auscultation: Breath sounds:- clear at anterior chest wall and - clear at posterior chest wall. Adventitious sounds:- No Adventitious sounds.  Cardiovascular Auscultation:Rhythm- Regular rate and rhythm. Heart Sounds- S1 WNL and S2 WNL. Murmurs & Other Heart Sounds:Auscultation of the heart reveals - No Murmurs.  Abdomen Palpation/Percussion:Tenderness- Abdomen is non-tender to palpation. Rigidity (guarding)-   Abdomen is  soft. Auscultation:Auscultation of the abdomen reveals - Bowel sounds normal. Note: Slightly round, soft  Female Genitourinary Note: Not done, not pertinent to present illness  Musculoskeletal Note: Well-developed female, alert and oriented, in no apparent distress. She has a significantly antalgic gait pattern with a significant abductor lurch on the right. Right hip can be flexed to 90. No internal or external rotation. No abduction or adduction. Left hip has normal ROM. Right knee exam  is unremarkable. Pulses, sensation and motor are intact, both lower extremities.  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 She has excellent bone stock. She has a rather small acetabulum. She has protrusio, but it is not all the way into the pelvis.  Assessment & Plan Osteoarthritis Right Hip  Note: Plan is for a Right Total Hip Replacement by Dr. Lequita Halt. Plan is to go to home following surgey.  Avel Peace, PA-C

## 2011-07-09 ENCOUNTER — Encounter (HOSPITAL_COMMUNITY): Payer: Self-pay | Admitting: Orthopedic Surgery

## 2011-07-09 LAB — BASIC METABOLIC PANEL
BUN: 9 mg/dL (ref 6–23)
Chloride: 105 mEq/L (ref 96–112)
Creatinine, Ser: 0.55 mg/dL (ref 0.50–1.10)
Glucose, Bld: 145 mg/dL — ABNORMAL HIGH (ref 70–99)
Potassium: 3.9 mEq/L (ref 3.5–5.1)

## 2011-07-09 LAB — CBC
HCT: 31 % — ABNORMAL LOW (ref 36.0–46.0)
Hemoglobin: 10.2 g/dL — ABNORMAL LOW (ref 12.0–15.0)
MCV: 89.1 fL (ref 78.0–100.0)
WBC: 7.7 10*3/uL (ref 4.0–10.5)

## 2011-07-09 NOTE — Progress Notes (Signed)
Physical Therapy Evaluation Patient Details Name: Monique [name] MRN: [id number] DOB: June 02, 1941 Today's Date: 07/09/2011  PT Eval 2 8:25-9:00  Problem List:  Patient Active Problem List  Diagnoses  . Primary osteoarthritis of right hip    Past Medical History:  Past Medical History  Diagnosis Date  . Asthma   . Pneumonia   . Hiatal hernia   . Hypertension     hypercholesterolemia/  EKG on chart with clearance and note 06/18/11  Mazzocchi  . Anemia      mild as per PCP  . Arthritis     hip/ s/p recent shoulder fracture 8/12- RIGHT   Past Surgical History:  Past Surgical History  Procedure Date  . Parathyroidectomy     one lobe- benign per pt  . Abdominal hysterectomy   . Tonsillectomy   . Eye surgery     eyelid tuck bilateral    PT Assessment/Plan/Recommendation PT Assessment Clinical Impression Statement: Pt tolerated activity well for POD #1. Verbalizes good understanding of posterior hip precautions. Expect good progress.  PT Recommendation/Assessment: Patient will need skilled PT in the acute care venue PT Problem List: Decreased strength;Decreased activity tolerance;Decreased mobility;Decreased knowledge of use of DME;Decreased knowledge of precautions;Pain Barriers to Discharge: None PT Therapy Diagnosis : Difficulty walking;Acute pain PT Plan PT Frequency: 7X/week PT Treatment/Interventions: DME instruction;Gait training;Stair training;Functional mobility training;Therapeutic activities;Therapeutic exercise;Patient/family education PT Recommendation Recommendations for Other Services: OT consult Follow Up Recommendations: Home health PT Equipment Recommended: None recommended by PT PT Goals  Acute Rehab PT Goals PT Goal Formulation: With patient Time For Goal Achievement: 5 days Pt will go Supine/Side to Sit: with modified independence;with HOB 0 degrees PT Goal: Supine/Side to Sit - Progress: Progressing toward goal Pt will go Sit to Stand: with  modified independence PT Goal: Sit to Stand - Progress: Progressing toward goal Pt will Ambulate: >150 feet;with rolling walker;with modified independence PT Goal: Ambulate - Progress: Progressing toward goal Pt will Go Up / Down Stairs: 3-5 stairs;with min assist (with 2 rails) PT Goal: Up/Down Stairs - Progress: Not met Pt will Perform Home Exercise Program: with min assist PT Goal: Perform Home Exercise Program - Progress: Progressing toward goal  PT Evaluation Precautions/Restrictions  Precautions Precautions: Posterior Hip Precaution Booklet Issued: Yes (comment) Precaution Comments: instructed RN in posterior hip precautions & issued her a handout Required Braces or Orthoses: No Restrictions Weight Bearing Restrictions: Yes RLE Weight Bearing: Partial weight bearing RLE Partial Weight Bearing Percentage or Pounds: 25-50% Prior Functioning  Home Living Lives With: Spouse Receives Help From: Family Type of Home: House Home Layout: One level Home Access: Stairs to enter Entrance Stairs-Rails: Can reach both;Left;Right Entrance Stairs-Number of Steps: 4 Bathroom Toilet: Handicapped height Home Adaptive Equipment: Walker - rolling;Straight cane;Other (comment) (pt stated she has shower chair and grab bars in bathroom) Prior Function Level of Independence: Requires assistive device for independence (used SPC for ambulation PTA) Able to Take Stairs?: Yes Cognition Cognition Arousal/Alertness: Awake/alert Overall Cognitive Status: Appears within functional limits for tasks assessed Orientation Level: Oriented X4 Sensation/Coordination Sensation Light Touch: Appears Intact Extremity Assessment RUE Assessment RUE Assessment: Exceptions to [facility] RUE Strength RUE Overall Strength: Deficits;Due to premorbid status (shoulder fx August 2012) LUE Assessment LUE Assessment: Within Functional Limits RLE Assessment RLE Assessment: Exceptions to [facility] RLE Strength Right Hip Flexion:  2-/5 Right Hip ABduction: 2/5 Right Knee Extension: 3/5 LLE Assessment LLE Assessment: Within Functional Limits Mobility (including Balance) Bed Mobility Bed Mobility: Yes Supine to Sit: 2: Max assist  Supine to Sit Details (indicate cue type and reason): pt 50%, assist to elevate trunk and support RLE Sitting - Scoot to Edge of Bed: 4: Min assist Sitting - Scoot to Edge of Bed Details (indicate cue type and reason): pt 80%, assist for weight shift Transfers Transfers: Yes Sit to Stand: From bed;With upper extremity assist;3: Mod assist Sit to Stand Details (indicate cue type and reason): pt 60% Stand to Sit: 4: Min assist;With armrests;To chair/3-in-1;With upper extremity assist Stand to Sit Details: VCs hand placement, assist to control descent Ambulation/Gait Ambulation/Gait: Yes Ambulation/Gait Assistance: 4: Min assist Ambulation/Gait Assistance Details (indicate cue type and reason): pt 80%, assist to steer RW, VCs sequencing Ambulation Distance (Feet): 25 Feet Assistive device: Rolling walker Gait Pattern: Step-to pattern;Decreased weight shift to right Stairs: No Wheelchair Mobility Wheelchair Mobility: No  Posture/Postural Control Posture/Postural Control: No significant limitations Balance Balance Assessed: Yes Static Sitting Balance Static Sitting - Balance Support: Bilateral upper extremity supported;Feet supported Static Sitting - Level of Assistance: 6: Modified independent (Device/Increase time) Static Sitting - Comment/# of Minutes: 4 Exercise  Total Joint Exercises Ankle Circles/Pumps: AROM;Both;10 reps;Supine Quad Sets: AAROM;Right;5 reps;Seated Heel Slides: AAROM;Right;10 reps;Supine Hip ABduction/ADduction: AAROM;Right;10 reps;Supine Long Arc Quad: AROM;Right;5 reps;Seated End of Session PT - End of Session Equipment Utilized During Treatment: Gait belt Activity Tolerance: Patient limited by fatigue Patient left: in chair;with call bell in  reach Nurse Communication: Mobility status for transfers;Mobility status for ambulation;Weight bearing status (posterior hip precautions) General Behavior During Session: [facility] for tasks performed Cognition: [facility] for tasks performed  Tamala Ser 07/09/2011, 11:51 AM Tamala Ser PT 07/09/2011  [phone]

## 2011-07-09 NOTE — Progress Notes (Signed)
07/09/2011 Raynelle Bring BSN CCM [phone] CM spoke with patient and spouse. Plans are for patient to go back to her home where spouse and grandson will be caregivers. Pt states she has rolling walker and that bathroom is handicapped equipped with elevated toilet seat, bars and shower seat. Pt is requesting HHpt and HH aid. Wants [facility] for [facility] services

## 2011-07-09 NOTE — Progress Notes (Signed)
Subjective: 1 Day Post-Op Procedure(s) (LRB): TOTAL HIP ARTHROPLASTY (Right) Patient reports pain as mild and moderate.   Patient seen in rounds with Dr. Lequita Halt. Patient has complaints of rough night with pain but better this AM.    We will start therapy today. Plan is to go home after hospital stay.  Objective: Vital signs in last 24 hours: Temp:  [96.8 F (36 C)-99.1 F (37.3 C)] 98 F (36.7 C) (12/06 0600) Pulse Rate:  [71-125] 96  (12/06 0600) Resp:  [8-28] 18  (12/06 0600) BP: (120-190)/(56-93) 146/78 mmHg (12/06 0600) SpO2:  [95 %-100 %] 95 % (12/06 0600) Weight:  [77.11 kg (170 lb)] 170 lb (77.11 kg) (12/05 1358)  Intake/Output from previous day:  Intake/Output Summary (Last 24 hours) at 07/09/11 0741 Last data filed at 07/09/11 0600  Gross per 24 hour  Intake   6172 ml  Output   4250 ml  Net   1922 ml    Intake/Output this shift:    Labs: Results for orders placed during the hospital encounter of 07/08/11  TYPE AND SCREEN      Component Value Range   ABO/RH(D) A POS     Antibody Screen NEG     Sample Expiration 07/11/2011    ABO/RH      Component Value Range   ABO/RH(D) A POS    CBC      Component Value Range   WBC 7.7  4.0 - 10.5 (K/uL)   RBC 3.48 (*) 3.87 - 5.11 (MIL/uL)   Hemoglobin 10.2 (*) 12.0 - 15.0 (g/dL)   HCT 16.1 (*) 09.6 - 46.0 (%)   MCV 89.1  78.0 - 100.0 (fL)   MCH 29.3  26.0 - 34.0 (pg)   MCHC 32.9  30.0 - 36.0 (g/dL)   RDW 04.5  40.9 - 81.1 (%)   Platelets 176  150 - 400 (K/uL)  BASIC METABOLIC PANEL      Component Value Range   Sodium 138  135 - 145 (mEq/L)   Potassium 3.9  3.5 - 5.1 (mEq/L)   Chloride 105  96 - 112 (mEq/L)   CO2 27  19 - 32 (mEq/L)   Glucose, Bld 145 (*) 70 - 99 (mg/dL)   BUN 9  6 - 23 (mg/dL)   Creatinine, Ser 9.14  0.50 - 1.10 (mg/dL)   Calcium 8.6  8.4 - 78.2 (mg/dL)   GFR calc non Af Amer >90  >90 (mL/min)   GFR calc Af Amer >90  >90 (mL/min)    Exam - Neurovascular intact Sensation intact  distally Dressing - clean, dry Motor function intact - moving foot and toes well on exam.  Hemovac pulled without difficulty.  Assessment/Plan: 1 Day Post-Op Procedure(s) (LRB): TOTAL HIP ARTHROPLASTY (Right)  Past Medical History  Diagnosis Date  . Asthma   . Pneumonia   . Hiatal hernia   . Hypertension     hypercholesterolemia/  EKG on chart with clearance and note 06/18/11  Mazzocchi  . Anemia      mild as per PCP  . Arthritis     hip/ s/p recent shoulder fracture 8/12- RIGHT    Advance diet Up with therapy Discharge home with home health when met goals probably over the weekend.  DVT Prophylaxis - Xarelto  Protocol Partial-Weight Bearing 25-50% right Leg D/C Knee Immobilizer Hemovac Pulled Begin Therapy Hip Preacutions Keep foley until tomorrow. No vaccines.  Monique [name] 07/09/2011, 7:41 AM

## 2011-07-09 NOTE — Progress Notes (Signed)
Utilization review completed.  

## 2011-07-09 NOTE — Progress Notes (Signed)
Physical Therapy Treatment Patient Details Name: Monique [name] MRN: [id number] DOB: 24-Dec-1940 Today's Date: 07/09/2011  12:45-13:20 G, TE  PT Assessment/Plan  PT - Assessment/Plan Comments on Treatment Session: Pt progressing well with mobility. In supine R hip tends to internally rotate. RLE repositioned with pillows.Pain with hip ABDuction AAROM.Good progress expected. PT Plan: Discharge plan remains appropriate PT Frequency: 7X/week Follow Up Recommendations: Home health PT Equipment Recommended: None recommended by PT PT Goals  Acute Rehab PT Goals PT Goal Formulation: With patient Time For Goal Achievement: 5 days Pt will go Supine/Side to Sit: with modified independence;with HOB 0 degrees PT Goal: Supine/Side to Sit - Progress: Progressing toward goal Pt will go Sit to Stand: with modified independence PT Goal: Sit to Stand - Progress: Progressing toward goal Pt will Ambulate: >150 feet;with rolling walker;with modified independence PT Goal: Ambulate - Progress: Progressing toward goal Pt will Go Up / Down Stairs: 3-5 stairs;with min assist PT Goal: Up/Down Stairs - Progress: Not met Pt will Perform Home Exercise Program: with min assist PT Goal: Perform Home Exercise Program - Progress: Progressing toward goal  PT Treatment Precautions/Restrictions  Precautions Precautions: Posterior Hip Precaution Booklet Issued: Yes (comment) Precaution Comments: reviewed precautions with pt Required Braces or Orthoses: No Restrictions Weight Bearing Restrictions: Yes RLE Weight Bearing: Partial weight bearing RLE Partial Weight Bearing Percentage or Pounds: 25-50% Mobility (including Balance) Bed Mobility Bed Mobility: Yes Sit to Supine - Right: 3: Mod assist Sit to Supine - Right Details (indicate cue type and reason): pt 60%, assist for BLEs Transfers Transfers: Yes Sit to Stand: From chair/3-in-1;With upper extremity assist;3: Mod assist;With armrests Sit to Stand  Details (indicate cue type and reason): pt 65%, assist to achieve upright position, VCs hand placement Stand to Sit: 4: Min assist;To bed;With upper extremity assist Stand to Sit Details: VCs hand placement Ambulation/Gait Ambulation/Gait: Yes Ambulation/Gait Assistance: 4: Min assist Ambulation/Gait Assistance Details (indicate cue type and reason): min A to steer RW Ambulation Distance (Feet): 30 Feet Assistive device: Rolling walker Gait Pattern: Step-to pattern;Decreased weight shift to right;Decreased step length - right;Decreased step length - left Stairs: No Wheelchair Mobility Wheelchair Mobility: No  Posture/Postural Control Posture/Postural Control: No significant limitations Exercise  Total Joint Exercises Ankle Circles/Pumps: AROM;Both;10 reps;Supine Quad Sets: AROM;10 reps;Right;Supine Gluteal Sets: AROM;Both;5 reps;Supine Short Arc Quad: AROM;Right;10 reps;Supine Heel Slides: AAROM;Right;10 reps;Supine Hip ABduction/ADduction: AAROM;5 reps;Supine (limited by pain with Abduction) Straight Leg Raises: AAROM;Right;5 reps;Supine (limited by pain) End of Session PT - End of Session Equipment Utilized During Treatment: Gait belt Activity Tolerance: Patient limited by fatigue Patient left: in bed;in CPM;with family/visitor present Nurse Communication: Mobility status for transfers;Mobility status for ambulation General Behavior During Session: [facility] for tasks performed Cognition: [facility] for tasks performed  Tamala Ser 07/09/2011, 1:30 PM [phone]

## 2011-07-10 DIAGNOSIS — E876 Hypokalemia: Secondary | ICD-10-CM | POA: Diagnosis not present

## 2011-07-10 LAB — CBC
HCT: 28.5 % — ABNORMAL LOW (ref 36.0–46.0)
Hemoglobin: 9.5 g/dL — ABNORMAL LOW (ref 12.0–15.0)
MCH: 29.8 pg (ref 26.0–34.0)
MCHC: 33.3 g/dL (ref 30.0–36.0)
MCV: 89.3 fL (ref 78.0–100.0)

## 2011-07-10 LAB — BASIC METABOLIC PANEL
BUN: 10 mg/dL (ref 6–23)
Calcium: 8.2 mg/dL — ABNORMAL LOW (ref 8.4–10.5)
Creatinine, Ser: 0.52 mg/dL (ref 0.50–1.10)
GFR calc non Af Amer: >90 mL/min (ref >90)
Glucose, Bld: 131 mg/dL — ABNORMAL HIGH (ref 70–99)

## 2011-07-10 MED ORDER — POTASSIUM CHLORIDE CRYS ER 20 MEQ PO TBCR
40.0000 meq | EXTENDED_RELEASE_TABLET | Freq: Every day | ORAL | Status: DC
  Administered 2011-07-10 – 2011-07-11 (×2): 40 meq via ORAL
  Filled 2011-07-10 (×3): qty 2

## 2011-07-10 MED ORDER — METHOCARBAMOL 500 MG PO TABS: 500.0000 mg | ORAL_TABLET | Freq: Four times a day (QID) | ORAL | Status: AC | PRN

## 2011-07-10 MED ORDER — HYDROMORPHONE HCL 2 MG PO TABS: 2.0000 mg | ORAL_TABLET | ORAL | Status: AC | PRN

## 2011-07-10 MED ORDER — RIVAROXABAN 10 MG PO TABS: 10.0000 mg | ORAL_TABLET | Freq: Every day | ORAL | Status: DC

## 2011-07-10 NOTE — Progress Notes (Signed)
Subjective: 2 Days Post-Op Procedure(s) (LRB): TOTAL HIP ARTHROPLASTY (Right) Patient reports pain as mild.   Patient seen in rounds with Dr. Lequita Halt. Patient has complaints of some pain but better this morning.  Did walk some on day one and sore from that.  Plan is to go home and will probably be ready to go tomorrow if meets all goals.   Objective: Vital signs in last 24 hours: Temp:  [97.5 F (36.4 C)-99.2 F (37.3 C)] 99.2 F (37.3 C) (12/07 0600) Pulse Rate:  [107-121] 111  (12/07 0600) Resp:  [18-20] 18  (12/07 0600) BP: (127-152)/(70-84) 148/71 mmHg (12/07 0600) SpO2:  [95 %-99 %] 96 % (12/07 0600)  Intake/Output from previous day:  Intake/Output Summary (Last 24 hours) at 07/10/11 0744 Last data filed at 07/10/11 0700  Gross per 24 hour  Intake 1292.75 ml  Output   2550 ml  Net -1257.25 ml    Intake/Output this shift:    Labs: Results for orders placed during the hospital encounter of 07/08/11  TYPE AND SCREEN      Component Value Range   ABO/RH(D) A POS     Antibody Screen NEG     Sample Expiration 07/11/2011    ABO/RH      Component Value Range   ABO/RH(D) A POS    CBC      Component Value Range   WBC 7.7  4.0 - 10.5 (K/uL)   RBC 3.48 (*) 3.87 - 5.11 (MIL/uL)   Hemoglobin 10.2 (*) 12.0 - 15.0 (g/dL)   HCT 40.9 (*) 81.1 - 46.0 (%)   MCV 89.1  78.0 - 100.0 (fL)   MCH 29.3  26.0 - 34.0 (pg)   MCHC 32.9  30.0 - 36.0 (g/dL)   RDW 91.4  78.2 - 95.6 (%)   Platelets 176  150 - 400 (K/uL)  BASIC METABOLIC PANEL      Component Value Range   Sodium 138  135 - 145 (mEq/L)   Potassium 3.9  3.5 - 5.1 (mEq/L)   Chloride 105  96 - 112 (mEq/L)   CO2 27  19 - 32 (mEq/L)   Glucose, Bld 145 (*) 70 - 99 (mg/dL)   BUN 9  6 - 23 (mg/dL)   Creatinine, Ser 2.13  0.50 - 1.10 (mg/dL)   Calcium 8.6  8.4 - 08.6 (mg/dL)   GFR calc non Af Amer >90  >90 (mL/min)   GFR calc Af Amer >90  >90 (mL/min)  CBC      Component Value Range   WBC 9.5  4.0 - 10.5 (K/uL)   RBC 3.19 (*)  3.87 - 5.11 (MIL/uL)   Hemoglobin 9.5 (*) 12.0 - 15.0 (g/dL)   HCT 57.8 (*) 46.9 - 46.0 (%)   MCV 89.3  78.0 - 100.0 (fL)   MCH 29.8  26.0 - 34.0 (pg)   MCHC 33.3  30.0 - 36.0 (g/dL)   RDW 62.9  52.8 - 41.3 (%)   Platelets 174  150 - 400 (K/uL)  BASIC METABOLIC PANEL      Component Value Range   Sodium 135  135 - 145 (mEq/L)   Potassium 3.4 (*) 3.5 - 5.1 (mEq/L)   Chloride 104  96 - 112 (mEq/L)   CO2 23  19 - 32 (mEq/L)   Glucose, Bld 131 (*) 70 - 99 (mg/dL)   BUN 10  6 - 23 (mg/dL)   Creatinine, Ser 2.44  0.50 - 1.10 (mg/dL)   Calcium 8.2 (*) 8.4 -  10.5 (mg/dL)   GFR calc non Af Amer >90  >90 (mL/min)   GFR calc Af Amer >90  >90 (mL/min)    Exam - Neurovascular intact Sensation intact distally Dressing/Incision - clean, dry, no drainage Motor function intact - moving foot and toes well on exam.   Assessment/Plan: 2 Days Post-Op Procedure(s) (LRB): TOTAL HIP ARTHROPLASTY (Right)  Past Medical History  Diagnosis Date  . Asthma   . Pneumonia   . Hiatal hernia   . Hypertension     hypercholesterolemia/  EKG on chart with clearance and note 06/18/11  Mazzocchi  . Anemia      mild as per PCP  . Arthritis     hip/ s/p recent shoulder fracture 8/12- RIGHT  Hypokalemia - KDUR today and recheck BMET  Up with therapy Discharge home with home health when met goals  DVT Prophylaxis - Xarelto  Protocol Partial-Weight Bearing 25-50% right Leg  PERKINS, ALEXZANDREW 07/10/2011, 7:44 AM

## 2011-07-10 NOTE — Progress Notes (Addendum)
Occupational Therapy Evaluation Patient Details Name: Monique [name] MRN: [id number] DOB: 11-28-1940 Today's Date: 07/10/2011 1006 [id number] [place]  Problem List:  Patient Active Problem List  Diagnoses  . Primary osteoarthritis of right hip  . Postop Hypokalemia    Past Medical History:  Past Medical History  Diagnosis Date  . Asthma   . Pneumonia   . Hiatal hernia   . Hypertension     hypercholesterolemia/  EKG on chart with clearance and note 06/18/11  Mazzocchi  . Anemia      mild as per PCP  . Arthritis     hip/ s/p recent shoulder fracture 8/12- RIGHT   Past Surgical History:  Past Surgical History  Procedure Date  . Parathyroidectomy     one lobe- benign per pt  . Abdominal hysterectomy   . Tonsillectomy   . Eye surgery     eyelid tuck bilateral  . Total hip arthroplasty 07/08/2011    Procedure: TOTAL HIP ARTHROPLASTY;  Surgeon: Gus Rankin Aluisio;  Location: [facility] ORS;  Service: Orthopedics;  Laterality: Right;    OT Assessment/Plan/Recommendation OT Assessment Clinical Impression Statement: This 70 year old female is s/p R posterior THA with PWB and posterior THPs.  She will benefit from skilled OT to increase safety and independence with ADLs in acute with min guard to supervision goals in acute. OT Recommendation/Assessment: Patient will need skilled OT in the acute care venue OT Problem List: Decreased strength;Decreased activity tolerance;Decreased knowledge of precautions;Decreased knowledge of use of DME or AE OT Therapy Diagnosis : Generalized weakness OT Plan OT Frequency: Min 2X/week OT Treatment/Interventions: Self-care/ADL training;DME and/or AE instruction;Therapeutic activities;Patient/family education OT Recommendation Follow Up Recommendations: Home health OT Equipment Recommended: 3 in 1 bedside comode Individuals Consulted Consulted and Agree with Results and Recommendations: Patient OT Goals Acute Rehab OT Goals OT Goal Formulation: With  patient Time For Goal Achievement: 7 days ADL Goals Pt Will Perform Grooming: with supervision;Standing at sink ADL Goal: Grooming - Progress: Progressing toward goals Pt Will Perform Lower Body Bathing: with supervision;with adaptive equipment;Sit to stand from chair ADL Goal: Lower Body Bathing - Progress: Progressing toward goals Pt Will Perform Lower Body Dressing: with min assist;Sit to stand from chair;with adaptive equipment ADL Goal: Lower Body Dressing - Progress: Progressing toward goals Pt Will Transfer to Toilet: with supervision;Maintaining weight bearing status;Maintaining hip precautions (without cues) ADL Goal: Toilet Transfer - Progress: Progressing toward goals Pt Will Perform Toileting - Hygiene: with supervision;Standing at 3-in-1/toilet ADL Goal: Toileting - Hygiene - Progress: Progressing toward goals Pt Will Perform Tub/Shower Transfer: with min assist;Maintaining weight bearing status;Maintaining hip precautions;with cueing (comment type and amount) (with min cues for technique) ADL Goal: Tub/Shower Transfer - Progress: Progressing toward goals  OT Evaluation Precautions/Restrictions  Precautions Precautions: Posterior Hip Precaution Booklet Issued: Yes (comment) Precaution Comments: reviewed precautions with pt Required Braces or Orthoses: No Restrictions Weight Bearing Restrictions: Yes RLE Weight Bearing: Partial weight bearing RLE Partial Weight Bearing Percentage or Pounds: 25-50% Prior Functioning Home Living Bathroom Shower/Tub: Walk-in shower Bathroom Toilet: Handicapped height (comfort height; nothing next to it to push up from)   ADL ADL Eating/Feeding: Simulated;Set up Where Assessed - Eating/Feeding: Chair Grooming: Simulated;Set up Where Assessed - Grooming: Sitting, chair;Supported Location manager Bathing: Performed;Set up Where Assessed - Upper Body Bathing: Sitting at sink;Unsupported Lower Body Bathing: Performed;Minimal assistance Where  Assessed - Lower Body Bathing: Sit to stand from chair;Unsupported Upper Body Dressing: Performed;Set up Where Assessed - Upper Body Dressing: Sitting, chair;Unsupported Lower  Body Dressing: Performed;Moderate assistance (doffed sock with reacher; donned with sock aid) Where Assessed - Lower Body Dressing: Sit to stand from chair;Unsupported Toilet Transfer: Performed;Minimal assistance and min cues for internal rotation Toilet Transfer Method: Ambulating Toilet Transfer Equipment: Raised toilet seat with arms (or 3-in-1 over toilet) Toileting - Clothing Manipulation: Simulated;Minimal assistance Where Assessed - Glass blower/designer Manipulation: Standing Toileting - Hygiene: Simulated;Minimal assistance Where Assessed - Toileting Hygiene: Standing Tub/Shower Transfer: Other (comment) (educated pt on sequence; not ready to step over yet) Equipment Used: Reacher;Sock aid;Rolling walker ADL Comments: pt did well.  Initially inched right foot forward and hopped to keep weight off left; improved as session progressed.  Pt nervous about bed mobility as she had excrutiating pain yesterday. Did very well today.  Pt has strong internal rotation; cues to keep in neutral as much as possible Vision/Perception  Vision - History Patient Visual Report: No change from baseline Cognition Cognition Arousal/Alertness: Awake/alert Overall Cognitive Status: Appears within functional limits for tasks assessed Orientation Level: Oriented X4 Sensation/Coordination   Extremity Assessment RUE Assessment RUE Assessment: Within Functional Limits LUE Assessment LUE Assessment: Within Functional Limits (h/o L shoulder fx and R caught in car door; wfl for adls) Mobility  Bed Mobility Bed Mobility: Yes Supine to Sit: 3: Mod assist Supine to Sit Details (indicate cue type and reason): pt 60%, assist to advance & support RLE, and to elevate trunk Sitting - Scoot to Edge of Bed: 4: Min assist Sit to Supine - Right:  4: Min assist Sit to Supine - Right Details (indicate cue type and reason): min cues for technique and extra time Transfers Transfers: Yes Sit to Stand: 4: Min assist;From elevated surface;From chair/3-in-1 Sit to Stand Details (indicate cue type and reason): min cues to extend RLE and for hand placement Stand to Sit: 5: Supervision Stand to Sit Details: min cues to extend RLE and hand placement Exercises End of Session OT - End of Session Activity Tolerance: Patient tolerated treatment well Patient left: in bed;with call bell in reach General Behavior During Session: [facility] for tasks performed Cognition: [facility] for tasks performed   Nashon Erbes [phone] 07/10/2011, 11:40 AM

## 2011-07-10 NOTE — Progress Notes (Signed)
Physical Therapy Treatment Patient Details Name: Monique [name] MRN: [id number] DOB: 1941-04-19 Today's Date: 07/10/2011 13:20-14:00 G, TE, TA  PT Assessment/Plan  PT - Assessment/Plan Comments on Treatment Session: Fatigues quickly with walking, pt stated 2 of 3 hip precautions, reviewed precautions. More practice needed on stairs, pt. not yet confident with them.  PT Plan: Discharge plan remains appropriate PT Frequency: 7X/week Follow Up Recommendations: Home health PT Equipment Recommended: 3 in 1 bedside comode PT Goals  Acute Rehab PT Goals Time For Goal Achievement: 5 days Pt will go Supine/Side to Sit: with modified independence;with HOB 0 degrees PT Goal: Supine/Side to Sit - Progress: Progressing toward goal Pt will go Sit to Stand: with modified independence PT Goal: Sit to Stand - Progress: Progressing toward goal Pt will Ambulate: with rolling walker;with modified independence;51 - 150 feet PT Goal: Ambulate - Progress: Progressing toward goal Pt will Go Up / Down Stairs: 3-5 stairs;with min assist PT Goal: Up/Down Stairs - Progress: Progressing toward goal Pt will Perform Home Exercise Program: with min assist PT Goal: Perform Home Exercise Program - Progress: Progressing toward goal  PT Treatment Precautions/Restrictions  Precautions Precautions: Posterior Hip Precaution Booklet Issued: Yes (comment) Precaution Comments: reviewed precautions with pt Required Braces or Orthoses: No Restrictions Weight Bearing Restrictions: Yes RLE Weight Bearing: Partial weight bearing RLE Partial Weight Bearing Percentage or Pounds: 25-50% Mobility (including Balance) Bed Mobility Bed Mobility: Yes Supine to Sit: 3: Mod assist Supine to Sit Details (indicate cue type and reason): pt 65%, assist to elevate trunk & support RLE Sitting - Scoot to Edge of Bed: 5: Supervision Sit to Supine - Right: 4: Min assist Sit to Supine - Right Details (indicate cue type and reason): min  cues for technique and extra time Transfers Transfers: Yes Sit to Stand: 4: Min assist;From bed;With upper extremity assist Sit to Stand Details (indicate cue type and reason): pt 90%, assist for balance Stand to Sit: 5: Supervision;To chair/3-in-1;With armrests;With upper extremity assist Stand to Sit Details: min cues to extend RLE and hand placement Ambulation/Gait Ambulation/Gait: Yes Ambulation/Gait Assistance: 4: Min assist Ambulation/Gait Assistance Details (indicate cue type and reason): assist to steer RW, min A balance Ambulation Distance (Feet): 32 Feet Assistive device: Rolling walker Gait Pattern: Step-to pattern;Decreased weight shift to right;Decreased step length - right;Decreased step length - left Stairs: Yes Stairs Assistance: 3: Mod assist Stairs Assistance Details (indicate cue type and reason): mod A for balance, VCs sequencing Stair Management Technique: With walker;Backwards;No rails (Attempted stairs with 2 rails, pt more comf. with RW        ) Number of Stairs: 2     Exercise  Total Joint Exercises Heel Slides: AAROM;Right;10 reps;Supine Hip ABduction/ADduction: AAROM;Right;10 reps;Supine End of Session PT - End of Session Equipment Utilized During Treatment: Gait belt Activity Tolerance: Patient limited by fatigue (LLE fatigues quickly) Patient left: in chair;with call bell in reach;with family/visitor present General Behavior During Session: [facility] for tasks performed Cognition: [facility] for tasks performed  Tamala Ser 07/10/2011, 2:13 PM

## 2011-07-10 NOTE — Progress Notes (Signed)
Physical Therapy Treatment Patient Details Name: Monique [name] MRN: [id number] DOB: June 28, 1941 Today's Date: 07/10/2011 8:58-9:36 G, TE, TA  PT Assessment/Plan  PT - Assessment/Plan Comments on Treatment Session: Pt fatigued quickly with ambulation, distance limited by LLE muscle fatigue, possibly due to increased WBing on L. Pt verbalized 1 of 3 hip precautions. REviewed precautions. Less pain today.  PT Plan: Discharge plan remains appropriate PT Frequency: 7X/week Follow Up Recommendations: Home health PT Equipment Recommended: None recommended by PT PT Goals  Acute Rehab PT Goals PT Goal Formulation: With patient Time For Goal Achievement: 5 days Pt will go Supine/Side to Sit: with modified independence;with HOB 0 degrees PT Goal: Supine/Side to Sit - Progress: Progressing toward goal Pt will go Sit to Stand: with modified independence PT Goal: Sit to Stand - Progress: Progressing toward goal Pt will Ambulate: >150 feet;with rolling walker;with modified independence PT Goal: Ambulate - Progress: Progressing toward goal Pt will Go Up / Down Stairs: 3-5 stairs;with min assist PT Goal: Up/Down Stairs - Progress: Not met Pt will Perform Home Exercise Program: with min assist PT Goal: Perform Home Exercise Program - Progress: Progressing toward goal  PT Treatment Precautions/Restrictions  Precautions Precautions: Posterior Hip Precaution Booklet Issued: Yes (comment) Precaution Comments: reviewed precautions with pt Required Braces or Orthoses: No Restrictions Weight Bearing Restrictions: Yes RLE Weight Bearing: Partial weight bearing RLE Partial Weight Bearing Percentage or Pounds: 25-50% Mobility (including Balance) Bed Mobility Bed Mobility: Yes Supine to Sit: 3: Mod assist Supine to Sit Details (indicate cue type and reason): pt 60%, assist to advance & support RLE, and to elevate trunk Sitting - Scoot to Edge of Bed: 4: Min assist Transfers Transfers: Yes Sit to  Stand: From bed;With upper extremity assist;3: Mod assist Sit to Stand Details (indicate cue type and reason): pt 65%, assist to achieve upright position Stand to Sit: 5: Supervision;With upper extremity assist;To chair/3-in-1;With armrests Stand to Sit Details: VCs hand placement Ambulation/Gait Ambulation/Gait: Yes Ambulation/Gait Assistance: 4: Min assist Ambulation/Gait Assistance Details (indicate cue type and reason): min A to steer RW with turns Ambulation Distance (Feet): 28 Feet (distance limited by LLE fatigue) Assistive device: Rolling walker Gait Pattern: Step-to pattern;Decreased weight shift to right;Decreased step length - right;Decreased step length - left    Exercise  Total Joint Exercises Ankle Circles/Pumps: AROM;Both;10 reps;Supine Quad Sets: AROM;10 reps;Right;Supine Gluteal Sets: AROM;Both;5 reps;Supine Short Arc Quad: AROM;Right;10 reps;Supine Heel Slides: AAROM;Right;10 reps;Supine Hip ABduction/ADduction: AAROM;Right;10 reps;Seated Straight Leg Raises: AAROM;Right;Strengthening;10 reps;Seated Long Arc Quad: AROM;Right;10 reps;Seated End of Session PT - End of Session Equipment Utilized During Treatment: Gait belt Activity Tolerance: Patient limited by fatigue Patient left: in chair;with call bell in reach;with family/visitor present Nurse Communication: Mobility status for transfers;Mobility status for ambulation (redness noted BUEs, RN notified. Pt denied itching.) General Behavior During Session: [facility] for tasks performed Cognition: [facility] for tasks performed  Tamala Ser 07/10/2011, 9:47 AM Tamala Ser PT 07/10/2011  [phone]

## 2011-07-10 NOTE — Progress Notes (Signed)
Foley catheter removed, patient tolerated procedure with issue.

## 2011-07-11 LAB — BASIC METABOLIC PANEL
BUN: 14 mg/dL (ref 6–23)
Chloride: 106 mEq/L (ref 96–112)
Creatinine, Ser: 0.55 mg/dL (ref 0.50–1.10)
GFR calc Af Amer: >90 mL/min (ref >90)
Glucose, Bld: 129 mg/dL — ABNORMAL HIGH (ref 70–99)
Potassium: 3.8 mEq/L (ref 3.5–5.1)

## 2011-07-11 LAB — CBC
HCT: 26.4 % — ABNORMAL LOW (ref 36.0–46.0)
Hemoglobin: 8.8 g/dL — ABNORMAL LOW (ref 12.0–15.0)
MCV: 91 fL (ref 78.0–100.0)
RDW: 13.6 % (ref 11.5–15.5)
WBC: 8.5 10*3/uL (ref 4.0–10.5)

## 2011-07-11 NOTE — Plan of Care (Signed)
Problem: Consults Goal: Diagnosis- Total Joint Replacement Outcome: Adequate for Discharge   Right total hip

## 2011-07-11 NOTE — Progress Notes (Signed)
Physical Therapy Treatment Patient Details Name: Monique [name] MRN: [id number] DOB: 1940-11-21 Today's Date: 07/11/2011 8:59-9:42, gt2, ta PT Assessment/Plan  PT - Assessment/Plan Comments on Treatment Session: Pt appears slightly anxious re: stairs and WB.  Reviewed 25-50% WB with pt, as she tends to keep TTWB on R LE.  Pt felt much better about backwards RW stair technique.  Educated on how to incorporate hip precautions into mobility.  Discussed d/c home, car transfers, and family support.  Pt does need 3-in-1 BSC for home use. PT Plan: Discharge plan remains appropriate Follow Up Recommendations: Home health PT Equipment Recommended: 3 in 1 bedside comode PT Goals  Acute Rehab PT Goals PT Goal: Supine/Side to Sit - Progress: Progressing toward goal PT Goal: Sit to Stand - Progress: Progressing toward goal PT Goal: Ambulate - Progress: Progressing toward goal PT Goal: Up/Down Stairs - Progress: Progressing toward goal PT Goal: Perform Home Exercise Program - Progress: Progressing toward goal  PT Treatment Precautions/Restrictions  Precautions Precautions: Posterior Hip Precaution Booklet Issued: Yes (comment) Precaution Comments: reviewed precautions with pt Required Braces or Orthoses: No Restrictions Weight Bearing Restrictions: Yes RLE Weight Bearing: Partial weight bearing RLE Partial Weight Bearing Percentage or Pounds: 25-50% Mobility (including Balance) Bed Mobility Supine to Sit: 3: Mod assist Supine to Sit Details (indicate cue type and reason): A for R LE at initial part of transfer and then to help pull trunk up at end of transfer. Sitting - Scoot to Edge of Bed: 5: Supervision Transfers Sit to Stand: From bed;From chair/3-in-1;From toilet (MIN/GUARD) Sit to Stand Details (indicate cue type and reason): min/guard from bed, recliner, and toilet.  Cues 25% for hand placement Stand to Sit: 5: Supervision;To chair/3-in-1;With armrests;With upper extremity assist;To  toilet Stand to Sit Details: good recall of proper technique. Ambulation/Gait Ambulation/Gait Assistance: 4: Min assist (min/guard) Ambulation Distance (Feet): 25 Feet Assistive device: Rolling walker Gait Pattern: Step-to pattern;Decreased weight shift to right;Decreased step length - right;Decreased step length - left Stairs Assistance: 4: Min assist;3: Mod assist Stairs Assistance Details (indicate cue type and reason): MOD A for B rail technique and MIN A for backwards RW technique Stair Management Technique: With walker;Backwards;No rails;Two rails;Forwards (performed 2 different ways, safer with backwards RW techniqu) Number of Stairs: 2     Exercise    End of Session PT - End of Session Equipment Utilized During Treatment: Gait belt Activity Tolerance: Patient limited by fatigue Patient left: in chair;with call bell in reach Nurse Communication: Mobility status for transfers;Mobility status for ambulation General Behavior During Session: [facility] for tasks performed Cognition: [facility] for tasks performed  [facility] LUBECK 07/11/2011, 9:58 AM

## 2011-07-11 NOTE — Progress Notes (Signed)
Pt. Comfortable.PO meds controling pain. NV + le's.  Disch home. See chart for plan.

## 2011-07-11 NOTE — Progress Notes (Signed)
Patient to be discharged to home via private vehicle with son. Home health set up with Genevieve Norlander for PT, Home Health aide, DME will be delivered to home. Patient is stable, no complaints at present time, all questions answered, verbalized understanding.

## 2011-07-11 NOTE — Progress Notes (Signed)
Cm spoke with pt concerning d/c plannning. Per pt choice [facility] to provide [facility] services. Pt request 3N1. Genevieve Norlander to deliver DME to residence upon pt d/c. Roxy Manns Fatma Rutten,RN,BSN,CM [phone]

## 2011-07-17 NOTE — Discharge Summary (Signed)
Physician Discharge Summary   Patient ID: Monique [name] MRN: [id number] DOB/AGE: 12-19-1940 70 y.o.  Admit date: 07/08/2011 Discharge date: 07/17/2011  Primary Diagnosis: Osteoarthritis Right Hip  Admission Diagnoses: Past Medical History  Diagnosis Date  . Asthma   . Pneumonia   . Hiatal hernia   . Hypertension     hypercholesterolemia/  EKG on chart with clearance and note 06/18/11  Mazzocchi  . Anemia      mild as per PCP  . Arthritis     hip/ s/p recent shoulder fracture 8/12- RIGHT    Discharge Diagnoses:  Principal Problem:  *Primary osteoarthritis of right hip Active Problems:  Postop Hypokalemia   Procedure: Procedure(s) (LRB): TOTAL HIP ARTHROPLASTY (Right)   Consults: none  HPI: Monique [name] is a 70 y.o. female with end stage arthritis of her right hip with progressively worsening pain and dysfunction. Pain occurs with activity and rest including pain at night. She has tried analgesics, protected weight bearing and rest without benefit. Pain is too severe to attempt physical therapy. Radiographs demonstrate bone on bone arthritis with subchondral cyst formation. She presents now for right THA.  Laboratory Data:   Sodium    138 135 136   Potassium    3.9 3.4 3.8   Chloride    105 104 106   CO2    27 23 26    BUN    9 10 14    Creatinine, Ser    0.55 0.52 0.55   Calcium    8.6 8.2 8.3   GFR calc non Af Amer    90 mL/min">90 90 mL/min">90 90 mL/min">90   GFR calc Af Amer         Glucose, Bld    145 131 129    CBC    WBC    7.7 9.5 8.5   RBC    3.48 3.19 2.90   Hemoglobin    10.2 9.5 8.8   HCT    31.0 28.5 26.4   MCV    89.1 89.3 91.0   MCH    29.3 29.8 30.3   MCHC    32.9 33.3 33.3   RDW    13.3 13.4 13.6   Platelets    176 174 164    DIABETES    Glucose, Bld    145 131 129    X-Rays:Dg Hip Complete Right  07/02/2011  *RADIOLOGY REPORT*  Clinical Data: Pain, preop  RIGHT HIP - COMPLETE 2+ VIEW  Comparison: None.  Findings: Advanced  loss of articular cartilage in the right hip with bone on bone apposition superiorly.  There is subchondral sclerosis in the superior acetabulum and femoral head with associated subchondral cysts or geodes.  There is marginal spurring from the femoral head and acetabulum.  Negative for fracture, dislocation, or other acute bony abnormality.  IMPRESSION:  1.  Advanced right hip osteoarthritis.  Original Report Authenticated By: Osa Craver, M.D.   Dg Pelvis Portable  07/08/2011  *RADIOLOGY REPORT*  Clinical Data: Postop right hip.  PORTABLE RIGHT HIP - 1 VIEW,PORTABLE PELVIS  Comparison: 07/02/2011.  Findings: Post total right hip replacement which appears in satisfactory position on this single AP projection.  Surgical drain is in place.  Lucency of the proximal to mid right femoral shaft below the femoral stem component may represent a vascular groove.  Overlying splint slightly limits evaluation.  Attention to this on follow-up.  IMPRESSION: Post total right hip replacement which appears in satisfactory position  on this single AP projection.  Surgical drain is in place.  Lucency of the proximal to mid right femoral shaft below the femoral stem component may represent a vascular groove.  Overlying splint slightly limits evaluation.  Attention to this on follow-up.  Original Report Authenticated By: Fuller Canada, M.D.   Dg Hip Portable 1 View Right  07/08/2011  *RADIOLOGY REPORT*  Clinical Data: Postop right hip.  PORTABLE RIGHT HIP - 1 VIEW,PORTABLE PELVIS  Comparison: 07/02/2011.  Findings: Post total right hip replacement which appears in satisfactory position on this single AP projection.  Surgical drain is in place.  Lucency of the proximal to mid right femoral shaft below the femoral stem component may represent a vascular groove.  Overlying splint slightly limits evaluation.  Attention to this on follow-up.  IMPRESSION: Post total right hip replacement which appears in satisfactory position  on this single AP projection.  Surgical drain is in place.  Lucency of the proximal to mid right femoral shaft below the femoral stem component may represent a vascular groove.  Overlying splint slightly limits evaluation.  Attention to this on follow-up.  Original Report Authenticated By: Fuller Canada, M.D.    EKG:No orders found for this or any previous visit.   Hospital Course: Patient was admitted to [facility] and taken to the OR and underwent the above state procedure without complications.  Patient tolerated the procedure well and was later transferred to the recovery room and then to the orthopaedic floor for postoperative care.  They were given PO and IV analgesics for pain control following their surgery.  They were given 24 hours of postoperative antibiotics and started on DVT prophylaxis.   PT and OT were ordered for total joint protocol.  Discharge planning consulted to help with postop disposition and equipment needs.  Patient had a rough night on the evening of surgery and started to get up with therapy on day one after pain improved. Hemovac drain was pulled without difficulty.  Continued to progress with therapy into day two and continued to improve with mobility.  Dressing was changed on day two and the incision was healing well.  By day three, the patient had progressed with therapy, incision was healing well, patient was seen in rounds and was ready to go home.    Discharge Medications: Prior to Admission medications   Medication Sig Start Date End Date Taking? Authorizing Provider  cetirizine (ZYRTEC) 10 MG tablet Take 10 mg by mouth at bedtime.    Yes Historical Provider, MD  lisinopril (PRINIVIL,ZESTRIL) 20 MG tablet Take 20 mg by mouth every morning.    Yes Historical Provider, MD  simvastatin (ZOCOR) 40 MG tablet Take 40 mg by mouth at bedtime.    Yes Historical Provider, MD  HYDROmorphone (DILAUDID) 2 MG tablet Take 1-2 tablets (2-4 mg total) by mouth every 4  (four) hours as needed. 07/10/11 07/20/11  Oris Calmes, PA  methocarbamol (ROBAXIN) 500 MG tablet Take 1 tablet (500 mg total) by mouth every 6 (six) hours as needed. 07/10/11 07/20/11  Holli Rengel Julien Girt, PA  rivaroxaban (XARELTO) 10 MG TABS tablet Take 1 tablet (10 mg total) by mouth daily with breakfast. 07/10/11   Mardee Clune Julien Girt, PA    Diet: heart healthy  Activity:PWB   Follow-up:in 2 weeks  Disposition: Home  Discharged Condition: good   Discharge Orders    Future Orders Please Complete By Expires   Diet - low sodium heart healthy      Call MD /  Call 911      Comments:   If you experience chest pain or shortness of breath, CALL 911 and be transported to the hospital emergency room.  If you develope a fever above 101 F, pus (white drainage) or increased drainage or redness at the wound, or calf pain, call your surgeon's office.   Constipation Prevention      Comments:   Drink plenty of fluids.  Prune juice may be helpful.  You may use a stool softener, such as Colace (over the counter) 100 mg twice a day.  Use MiraLax (over the counter) for constipation as needed.   Increase activity slowly as tolerated      Weight Bearing as taught in Physical Therapy      Comments:   Use a walker or crutches as instructed.   Discharge instructions      Comments:   Pick up stool softner and laxative for home. Do not submerge incision under water. May shower. Continue to use ice for pain and swelling from surgery. Hip precautions.  Total Hip Protocol.   Driving restrictions      Comments:   No driving   Lifting restrictions      Comments:   No lifting   Follow the hip precautions as taught in Physical Therapy      Change dressing      Comments:   You may change your dressing daily with sterile 4 x 4 inch gauze dressing and paper tape.   TED hose      Comments:   Use stockings (TED hose) for 3 weeks on both leg(s).  You may remove them at night for sleeping.      Discharge Medication List as of 07/11/2011  1:43 PM    START taking these medications   Details  HYDROmorphone (DILAUDID) 2 MG tablet Take 1-2 tablets (2-4 mg total) by mouth every 4 (four) hours as needed., Starting 07/10/2011, Until Mon 07/20/11, Print    methocarbamol (ROBAXIN) 500 MG tablet Take 1 tablet (500 mg total) by mouth every 6 (six) hours as needed., Starting 07/10/2011, Until Mon 07/20/11, Print    rivaroxaban (XARELTO) 10 MG TABS tablet Take 1 tablet (10 mg total) by mouth daily with breakfast., Starting 07/10/2011, Until Discontinued, Print      CONTINUE these medications which have NOT CHANGED   Details  cetirizine (ZYRTEC) 10 MG tablet Take 10 mg by mouth at bedtime. , Until Discontinued, Historical Med    lisinopril (PRINIVIL,ZESTRIL) 20 MG tablet Take 20 mg by mouth every morning. , Until Discontinued, Historical Med    simvastatin (ZOCOR) 40 MG tablet Take 40 mg by mouth at bedtime. , Until Discontinued, Historical Med      STOP taking these medications     diclofenac (FLECTOR) 1.3 % PTCH      HYDROcodone-acetaminophen (NORCO) 10-325 MG per tablet        Follow-up Information    Follow up with ALUISIO,FRANK V. Make an appointment in 2 weeks.   Contact information:   [facility] [address] [place] [address] [phone]       Follow up with Genevieve Norlander. (Home Physical Therapy)    Contact information:   [phone]         Signed: Patrica Duel 07/17/2011, 10:14 PM

## 2011-09-23 ENCOUNTER — Other Ambulatory Visit: Payer: Self-pay | Admitting: Dermatology

## 2013-07-24 ENCOUNTER — Other Ambulatory Visit: Payer: Self-pay | Admitting: Physical Medicine and Rehabilitation

## 2013-08-08 ENCOUNTER — Ambulatory Visit
Admission: RE | Admit: 2013-08-08 | Discharge: 2013-08-08 | Disposition: A | Discharge status: Home / Self Care | Payer: Medicare Other | Source: Ambulatory Visit | Attending: Physical Medicine and Rehabilitation | Admitting: Physical Medicine and Rehabilitation

## 2013-08-08 ENCOUNTER — Other Ambulatory Visit: Payer: Self-pay | Admitting: Physical Medicine and Rehabilitation

## 2013-08-08 DIAGNOSIS — M25551 Pain in right hip: Secondary | ICD-10-CM

## 2014-09-13 ENCOUNTER — Other Ambulatory Visit: Payer: Self-pay | Admitting: Orthopaedic Surgery

## 2014-09-13 DIAGNOSIS — M4316 Spondylolisthesis, lumbar region: Secondary | ICD-10-CM

## 2014-10-01 ENCOUNTER — Ambulatory Visit
Admission: RE | Admit: 2014-10-01 | Discharge: 2014-10-01 | Disposition: A | Discharge status: Home / Self Care | Payer: Medicare Other | Source: Ambulatory Visit | Attending: Orthopaedic Surgery | Admitting: Orthopaedic Surgery

## 2014-10-01 DIAGNOSIS — M4316 Spondylolisthesis, lumbar region: Secondary | ICD-10-CM

## 2016-04-21 ENCOUNTER — Inpatient Hospital Stay (HOSPITAL_COMMUNITY)
Admission: EM | Admit: 2016-04-21 | Discharge: 2016-05-19 | DRG: 330 | Disposition: A | Discharge status: Home Health | Payer: Medicare Other | Attending: Surgery | Admitting: Surgery

## 2016-04-21 ENCOUNTER — Encounter (HOSPITAL_COMMUNITY): Payer: Self-pay | Admitting: Emergency Medicine

## 2016-04-21 ENCOUNTER — Other Ambulatory Visit: Payer: Self-pay | Admitting: Nurse Practitioner

## 2016-04-21 ENCOUNTER — Ambulatory Visit
Admission: RE | Admit: 2016-04-21 | Discharge: 2016-04-21 | Disposition: A | Discharge status: Home / Self Care | Payer: Medicare Other | Source: Ambulatory Visit | Attending: Nurse Practitioner | Admitting: Nurse Practitioner

## 2016-04-21 DIAGNOSIS — E440 Moderate protein-calorie malnutrition: Secondary | ICD-10-CM | POA: Diagnosis present

## 2016-04-21 DIAGNOSIS — Z22322 Carrier or suspected carrier of Methicillin resistant Staphylococcus aureus: Secondary | ICD-10-CM

## 2016-04-21 DIAGNOSIS — R262 Difficulty in walking, not elsewhere classified: Secondary | ICD-10-CM

## 2016-04-21 DIAGNOSIS — Z7982 Long term (current) use of aspirin: Secondary | ICD-10-CM | POA: Diagnosis not present

## 2016-04-21 DIAGNOSIS — Z96641 Presence of right artificial hip joint: Secondary | ICD-10-CM | POA: Diagnosis present

## 2016-04-21 DIAGNOSIS — Z452 Encounter for adjustment and management of vascular access device: Secondary | ICD-10-CM

## 2016-04-21 DIAGNOSIS — R1031 Right lower quadrant pain: Secondary | ICD-10-CM

## 2016-04-21 DIAGNOSIS — K5720 Diverticulitis of large intestine with perforation and abscess without bleeding: Secondary | ICD-10-CM | POA: Diagnosis present

## 2016-04-21 DIAGNOSIS — R110 Nausea: Secondary | ICD-10-CM

## 2016-04-21 DIAGNOSIS — K651 Peritoneal abscess: Secondary | ICD-10-CM

## 2016-04-21 DIAGNOSIS — K56609 Unspecified intestinal obstruction, unspecified as to partial versus complete obstruction: Secondary | ICD-10-CM

## 2016-04-21 DIAGNOSIS — Z7901 Long term (current) use of anticoagulants: Secondary | ICD-10-CM | POA: Diagnosis not present

## 2016-04-21 DIAGNOSIS — F1120 Opioid dependence, uncomplicated: Secondary | ICD-10-CM | POA: Diagnosis present

## 2016-04-21 DIAGNOSIS — K219 Gastro-esophageal reflux disease without esophagitis: Secondary | ICD-10-CM | POA: Diagnosis present

## 2016-04-21 DIAGNOSIS — I10 Essential (primary) hypertension: Secondary | ICD-10-CM | POA: Diagnosis present

## 2016-04-21 DIAGNOSIS — G8929 Other chronic pain: Secondary | ICD-10-CM | POA: Diagnosis present

## 2016-04-21 DIAGNOSIS — K567 Ileus, unspecified: Secondary | ICD-10-CM | POA: Diagnosis not present

## 2016-04-21 DIAGNOSIS — IMO0002 Reserved for concepts with insufficient information to code with codable children: Secondary | ICD-10-CM

## 2016-04-21 DIAGNOSIS — R198 Other specified symptoms and signs involving the digestive system and abdomen: Secondary | ICD-10-CM

## 2016-04-21 DIAGNOSIS — K449 Diaphragmatic hernia without obstruction or gangrene: Secondary | ICD-10-CM | POA: Diagnosis present

## 2016-04-21 LAB — CBC WITH DIFFERENTIAL/PLATELET
BASOS ABS: 0 10*3/uL (ref 0.0–0.1)
Basophils Relative: 0 %
EOS ABS: 0 10*3/uL (ref 0.0–0.7)
EOS PCT: 0 %
HCT: 45.5 % (ref 36.0–46.0)
Hemoglobin: 15.8 g/dL — ABNORMAL HIGH (ref 12.0–15.0)
LYMPHS PCT: 6 %
Lymphs Abs: 1.2 10*3/uL (ref 0.7–4.0)
MCH: 31.9 pg (ref 26.0–34.0)
MCHC: 34.7 g/dL (ref 30.0–36.0)
MCV: 91.7 fL (ref 78.0–100.0)
Monocytes Absolute: 1.3 10*3/uL — ABNORMAL HIGH (ref 0.1–1.0)
Monocytes Relative: 7 %
Neutro Abs: 16.9 10*3/uL — ABNORMAL HIGH (ref 1.7–7.7)
Neutrophils Relative %: 87 %
PLATELETS: 264 10*3/uL (ref 150–400)
RBC: 4.96 MIL/uL (ref 3.87–5.11)
RDW: 13.6 % (ref 11.5–15.5)
WBC: 19.5 10*3/uL — AB (ref 4.0–10.5)

## 2016-04-21 LAB — COMPREHENSIVE METABOLIC PANEL
ALT: 23 U/L (ref 14–54)
AST: 19 U/L (ref 15–41)
Albumin: 3.5 g/dL (ref 3.5–5.0)
Alkaline Phosphatase: 60 U/L (ref 38–126)
Anion gap: 12 (ref 5–15)
BUN: 31 mg/dL — ABNORMAL HIGH (ref 6–20)
CHLORIDE: 97 mmol/L — AB (ref 101–111)
CO2: 26 mmol/L (ref 22–32)
CREATININE: 0.81 mg/dL (ref 0.44–1.00)
Calcium: 9.4 mg/dL (ref 8.9–10.3)
GFR calc non Af Amer: >60 mL/min (ref >60)
Glucose, Bld: 155 mg/dL — ABNORMAL HIGH (ref 65–99)
POTASSIUM: 4.3 mmol/L (ref 3.5–5.1)
SODIUM: 135 mmol/L (ref 135–145)
TOTAL PROTEIN: 6.9 g/dL (ref 6.5–8.1)
Total Bilirubin: 1.2 mg/dL (ref 0.3–1.2)

## 2016-04-21 LAB — LIPASE, BLOOD: LIPASE: 19 U/L (ref 11–51)

## 2016-04-21 MED ORDER — CIPROFLOXACIN IN D5W 400 MG/200ML IV SOLN
400.0000 mg | Freq: Once | INTRAVENOUS | Status: AC
  Administered 2016-04-21: 400 mg via INTRAVENOUS
  Filled 2016-04-21: qty 200

## 2016-04-21 MED ORDER — HYDROMORPHONE HCL 1 MG/ML IJ SOLN
0.5000 mg | INTRAMUSCULAR | Status: DC | PRN
  Administered 2016-04-21 – 2016-04-28 (×47): 0.5 mg via INTRAVENOUS
  Filled 2016-04-21 (×47): qty 1

## 2016-04-21 MED ORDER — IOPAMIDOL (ISOVUE-300) INJECTION 61%
100.0000 mL | Freq: Once | INTRAVENOUS | Status: AC | PRN
  Administered 2016-04-21: 100 mL via INTRAVENOUS

## 2016-04-21 MED ORDER — SODIUM CHLORIDE 0.9 % IV BOLUS (SEPSIS)
1000.0000 mL | Freq: Once | INTRAVENOUS | Status: AC
  Administered 2016-04-21: 1000 mL via INTRAVENOUS

## 2016-04-21 MED ORDER — ONDANSETRON 4 MG PO TBDP
4.0000 mg | ORAL_TABLET | Freq: Four times a day (QID) | ORAL | Status: DC | PRN
  Administered 2016-04-28 – 2016-05-17 (×11): 4 mg via ORAL
  Filled 2016-04-21 (×12): qty 1

## 2016-04-21 MED ORDER — CLINDAMYCIN PHOSPHATE 600 MG/50ML IV SOLN: 600.0000 mg | Freq: Once | INTRAVENOUS | Status: DC

## 2016-04-21 MED ORDER — PIPERACILLIN-TAZOBACTAM 3.375 G IVPB
3.3750 g | Freq: Three times a day (TID) | INTRAVENOUS | Status: DC
  Administered 2016-04-21 – 2016-05-03 (×37): 3.375 g via INTRAVENOUS
  Filled 2016-04-21 (×39): qty 50

## 2016-04-21 MED ORDER — METRONIDAZOLE IN NACL 5-0.79 MG/ML-% IV SOLN
500.0000 mg | Freq: Once | INTRAVENOUS | Status: AC
  Administered 2016-04-21: 500 mg via INTRAVENOUS
  Filled 2016-04-21: qty 100

## 2016-04-21 MED ORDER — ONDANSETRON HCL 4 MG/2ML IJ SOLN
4.0000 mg | Freq: Four times a day (QID) | INTRAMUSCULAR | Status: DC | PRN
  Administered 2016-04-21 – 2016-05-14 (×21): 4 mg via INTRAVENOUS
  Filled 2016-04-21 (×22): qty 2

## 2016-04-21 MED ORDER — LISINOPRIL 20 MG PO TABS
20.0000 mg | ORAL_TABLET | Freq: Every day | ORAL | Status: DC
  Administered 2016-04-23 – 2016-04-25 (×3): 20 mg via ORAL
  Filled 2016-04-21 (×4): qty 1

## 2016-04-21 MED ORDER — KCL IN DEXTROSE-NACL 20-5-0.45 MEQ/L-%-% IV SOLN
INTRAVENOUS | Status: DC
  Administered 2016-04-21 – 2016-04-29 (×13): via INTRAVENOUS
  Administered 2016-04-30: 1000 mL via INTRAVENOUS
  Administered 2016-04-30: 01:00:00 via INTRAVENOUS
  Administered 2016-05-01: 1000 mL via INTRAVENOUS
  Administered 2016-05-02 – 2016-05-04 (×3): via INTRAVENOUS
  Filled 2016-04-21 (×28): qty 1000

## 2016-04-21 MED ORDER — CLINDAMYCIN PHOSPHATE 300 MG/50ML IV SOLN: 300.0000 mg | Freq: Once | INTRAVENOUS | Status: DC

## 2016-04-21 NOTE — ED Provider Notes (Signed)
[facility]-EMERGENCY DEPT Provider Note   CSN: [id number] Arrival date & time: 04/21/16  1604     History   Chief Complaint Chief Complaint  Patient presents with  . Abdominal Pain    abnormal CT    HPI RETAJ HILBUN is a 75 y.o. female.  Level V caveat for urgent need for intervention. Right-sided abdominal pain since Friday, getting worse. She went to her primary care doctor today. A CT scan of the abdomen was ordered. CT reveals a 4.2 x 2.7 cm complex fluid collection in the right midabdomen consistent with an abscess. There is also a small volume pneumoperitoneum suggesting a perforated viscus. No previous intra-abdominal abscess or perforation. Previous abdominal surgery includes an abdominal hysterectomy      Past Medical History:  Diagnosis Date  . Anemia     mild as per PCP  . Arthritis    hip/ s/p recent shoulder fracture 8/12- RIGHT  . Asthma   . Hiatal hernia   . Hypertension    hypercholesterolemia/  EKG on chart with clearance and note 06/18/11  Mazzocchi  . Pneumonia     Patient Active Problem List   Diagnosis Date Noted  . Postop Hypokalemia 07/10/2011  . 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 ABDOMINAL HYSTERECTOMY    . EYE SURGERY     eyelid tuck bilateral  . PARATHYROIDECTOMY     one lobe- benign per pt  . TONSILLECTOMY    . TOTAL HIP ARTHROPLASTY  07/08/2011   Procedure: TOTAL HIP ARTHROPLASTY;  Surgeon: Gus Rankin Aluisio;  Location: [facility];  Service: Orthopedics;  Laterality: Right;    OB History    No data available       Home Medications    Prior to Admission medications   Medication Sig Start Date End Date Taking? Authorizing Provider  cetirizine (ZYRTEC) 10 MG tablet Take 10 mg by mouth at bedtime.     Historical Provider, MD  lisinopril (PRINIVIL,ZESTRIL) 20 MG tablet Take 20 mg by mouth every morning.     Historical Provider, MD  rivaroxaban (XARELTO) 10 MG TABS  tablet Take 1 tablet (10 mg total) by mouth daily with breakfast. 07/10/11   Avel Peace, PA-C  simvastatin (ZOCOR) 40 MG tablet Take 40 mg by mouth at bedtime.     Historical Provider, MD    Family History History reviewed. No pertinent family history.  Social History Social History  Substance Use Topics  . Smoking status: Never Smoker  . Smokeless tobacco: Never Used  . Alcohol use Yes     Comment: socially- 2 x year     Allergies   Penicillins; Percocet [oxycodone-acetaminophen]; and Pregabalin   Review of Systems Review of Systems  Reason unable to perform ROS: Urgent need for intervention.     Physical Exam Updated Vital Signs BP 148/92 (BP Location: Right Arm)   Pulse 99   Resp 16   SpO2 97%   Physical Exam  Constitutional: She is oriented to person, place, and time.  Obese, pale  HENT:  Head: Normocephalic and atraumatic.  Eyes: Conjunctivae are normal.  Neck: Neck supple.  Cardiovascular: Normal rate and regular rhythm.   Pulmonary/Chest: Effort normal and breath sounds normal.  Abdominal:  Tender right mid anterior lateral abdomen.  Musculoskeletal: Normal range of motion.  Neurological: She is alert and oriented to person, place, and time.  Skin: Skin is warm and dry.  Psychiatric: She has  a normal mood and affect. Her behavior is normal.  Nursing note and vitals reviewed.    ED Treatments / Results  Labs (all labs ordered are listed, but only abnormal results are displayed) Labs Reviewed  CBC WITH DIFFERENTIAL/PLATELET  COMPREHENSIVE METABOLIC PANEL  LIPASE, BLOOD    EKG  EKG Interpretation None       Radiology Ct Abdomen Pelvis W Contrast  Result Date: 04/21/2016 CLINICAL DATA:  Abdominal fullness, bloating of the right upper and lower quadrants. Nausea. EXAM: CT ABDOMEN AND PELVIS WITH CONTRAST TECHNIQUE: Multidetector CT imaging of the abdomen and pelvis was performed using the standard protocol following bolus administration of  intravenous contrast. CONTRAST:  100mL ISOVUE-300 IOPAMIDOL (ISOVUE-300) INJECTION 61% COMPARISON:  None. FINDINGS: Lower chest: Lung bases are clear. Hepatobiliary: No focal hepatic mass.  Cholelithiasis. Pancreas: Unremarkable. No pancreatic ductal dilatation or surrounding inflammatory changes. Spleen: Normal in size without focal abnormality. Adrenals/Urinary Tract: Adrenal glands are unremarkable. No renal mass. 10 mm nonobstructing left renal calculus. 7 mm nonobstructing right lower pole renal calculus. No obstructive uropathy. Bladder is unremarkable. Stomach/Bowel: 4.2 x 2.7 cm complex fluid collection in the right mid abdomen with an air-fluid level most consistent with an abscess. Small volume pneumoperitoneum most consistent with perforated viscus. The complex fluid collection abuts a sigmoid diverticula without significant colonic wall thickening or surrounding inflammatory changes. Small bowel dilatation measuring up to 3.8 cm with a relative transition point in the right mid abdomen. Moderate amount of pelvic free fluid. Vascular/Lymphatic: Normal caliber abdominal aorta with atherosclerosis. No lymphadenopathy. Reproductive: Status post hysterectomy. No adnexal masses. Other: No abdominal wall hernia. Musculoskeletal: No lytic or sclerotic osseous lesion. Severe degenerative disc disease with disc height loss at L5-S1. Grade 1 anterolisthesis of L5 on S1 secondary to facet disease. Right total hip arthroplasty. Mild osteoarthritis of the left hip. IMPRESSION: 1. 4.2 x 2.7 cm complex fluid collection in the right mid abdomen with an air-fluid level most consistent with an abscess. Small volume pneumoperitoneum most consistent with perforated viscus. The complex fluid collection abuts a sigmoid diverticula without significant colonic wall thickening or surrounding inflammatory changes. Small bowel dilatation measuring up to 3.8 cm with a relative transition point in the right mid abdomen. Differential  considerations include perforated diverticulitis versus perforated enteritis with an abscess in the right mid abdomen resulting in small bowel obstruction. 2. Bilateral nonobstructing renal calculi. 3. Cholelithiasis. Critical Value/emergent results were called by telephone at the time of interpretation on 04/21/2016 at 3:45 pm to [facility] , who verbally acknowledged these results. Electronically Signed   By: Elige KoHetal  Patel   On: 04/21/2016 15:46    Procedures Procedures (including critical care time)  Medications Ordered in ED Medications  sodium chloride 0.9 % bolus 1,000 mL (not administered)  clindamycin (CLEOCIN) IVPB 300 mg (not administered)  clindamycin (CLEOCIN) IVPB 600 mg (not administered)     Initial Impression / Assessment and Plan / ED Course  I have reviewed the triage vital signs and the nursing notes.  Pertinent labs & imaging results that were available during my care of the patient were reviewed by me and considered in my medical decision making (see chart for details).  Clinical Course    Stat general surgery consult. IV clindamycin.  Final Clinical Impressions(s) / ED Diagnoses   Final diagnoses:  Abdominal abscess (HCC)  Perforated viscus    New Prescriptions New Prescriptions   No medications on file     Donnetta HutchingBrian Dirk Vanaman, MD 04/24/16 1709

## 2016-04-21 NOTE — H&P (Signed)
Chief Complaint:  Abdominal pain since Sunday night  History of Present Illness:  Monique [name] is an 75 y.o. female who had a busy weekend but began feeling bad Sunday night [place].  She has had a hip replacement and takes steroid injections per Dr. [place]risp [place] and she last received one 2 weeks ago.  She admits to constipation since she is on chronic pain meds.  She is followed by Dr. [place]had Badger and his NP ordered a CT today which showed a perforation/abscess in the right lower quadrant.    Past Medical History:  Diagnosis Date  . Anemia     mild as per PCP  . Arthritis    hip/ s/p recent shoulder fracture 8/12- RIGHT  . Asthma   . Hiatal hernia   . Hypertension    hypercholesterolemia/  EKG on chart with clearance and note 06/18/11  Mazzocchi  . Pneumonia     Past Surgical History:  Procedure Laterality Date  . ABDOMINAL HYSTERECTOMY    . EYE SURGERY     eyelid tuck bilateral  . PARATHYROIDECTOMY     one lobe- benign per pt  . TONSILLECTOMY    . TOTAL HIP ARTHROPLASTY  07/08/2011   Procedure: TOTAL HIP ARTHROPLASTY;  Surgeon: Gus RankinFrank V Aluisio;  Location: [facility];  Service: Orthopedics;  Laterality: Right;    Current Facility-Administered Medications  Medication Dose Route Frequency Provider Last Rate Last Dose  . ciprofloxacin (CIPRO) IVPB 400 mg  400 mg Intravenous Once Donnetta HutchingBrian Cook, MD      . metroNIDAZOLE (FLAGYL) IVPB 500 mg  500 mg Intravenous Once Donnetta HutchingBrian Cook, MD 100 mL/hr at 04/21/16 1727 500 mg at 04/21/16 1727   Current Outpatient Prescriptions  Medication Sig Dispense Refill  . aspirin EC 81 MG tablet Take 81 mg by mouth daily.    . cetirizine (ZYRTEC) 10 MG tablet Take 10 mg by mouth at bedtime.     Marland Kitchen. lisinopril (PRINIVIL,ZESTRIL) 20 MG tablet Take 20 mg by mouth every morning.     Marland Kitchen. oxyCODONE-acetaminophen (PERCOCET) 10-325 MG tablet Take 1 tablet by mouth every 6 (six) hours as needed for pain.    . rivaroxaban (XARELTO) 10  MG TABS tablet Take 1 tablet (10 mg total) by mouth daily with breakfast. (Patient taking differently: Take 10 mg by mouth at bedtime. ) 18 tablet 0  . simvastatin (ZOCOR) 40 MG tablet Take 40 mg by mouth at bedtime.      Penicillins; Percocet [oxycodone-acetaminophen]; and Pregabalin History reviewed. No pertinent family history. Social History:   reports that she has never smoked. She has never used smokeless tobacco. She reports that she drinks alcohol. She reports that she does not use drugs.   REVIEW OF SYSTEMS : Negative except for see problem list.    Physical Exam:   Blood pressure 148/92, pulse 99, resp. rate 16, SpO2 97 %. There is no height or weight on file to calculate BMI.  Gen:  [facility] W[facility] NAD  Neurological: Alert and oriented to person, place, and time. Motor and sensory function is grossly intact  Head: Normocephalic and atraumatic.  Eyes: Conjunctivae are normal. Pupils are equal, round, and reactive to light. No scleral icterus.  Neck: Normal range of motion. Neck supple. No tracheal deviation or thyromegaly present.  No bruits Cardiovascular:  SR without murmurs or gallops.  No carotid bruits Breast:  Not examined Respiratory: Effort normal.  No respiratory distress. No chest wall tenderness. Breath sounds  normal.  No wheezes, rales or rhonchi.  Abdomen:  Distended and tender to palpation.   GU:  Not examined  Pscyh: Normal mood and affect. Behavior is normal. Judgment and thought content normal.   LABORATORY RESULTS: Results for orders placed or performed during the hospital encounter of 04/21/16 (from the past 48 hour(s))  CBC with Differential     Status: Abnormal   Collection Time: 04/21/16  5:35 PM  Result Value Ref Range   WBC 19.5 (H) 4.0 - 10.5 K/uL   RBC 4.96 3.87 - 5.11 MIL/uL   Hemoglobin 15.8 (H) 12.0 - 15.0 g/dL   HCT 16.1 09.6 - 04.5 %   MCV 91.7 78.0 - 100.0 fL   MCH 31.9 26.0 - 34.0 pg   MCHC 34.7 30.0 - 36.0 g/dL   RDW 40.9 81.1 - 91.4 %    Platelets 264 150 - 400 K/uL   Neutrophils Relative % 87 %   Neutro Abs 16.9 (H) 1.7 - 7.7 K/uL   Lymphocytes Relative 6 %   Lymphs Abs 1.2 0.7 - 4.0 K/uL   Monocytes Relative 7 %   Monocytes Absolute 1.3 (H) 0.1 - 1.0 K/uL   Eosinophils Relative 0 %   Eosinophils Absolute 0.0 0.0 - 0.7 K/uL   Basophils Relative 0 %   Basophils Absolute 0.0 0.0 - 0.1 K/uL     RADIOLOGY RESULTS: Ct Abdomen Pelvis W Contrast  Result Date: 04/21/2016 CLINICAL DATA:  Abdominal fullness, bloating of the right upper and lower quadrants. Nausea. EXAM: CT ABDOMEN AND PELVIS WITH CONTRAST TECHNIQUE: Multidetector CT imaging of the abdomen and pelvis was performed using the standard protocol following bolus administration of intravenous contrast. CONTRAST:  ISOVUE-300 IOPAMIDOL (ISOVUE-300) INJECTION 61% COMPARISON:  None. FINDINGS: Lower chest: Lung bases are clear. Hepatobiliary: No focal hepatic mass.  Cholelithiasis. Pancreas: Unremarkable. No pancreatic ductal dilatation or surrounding inflammatory changes. Spleen: Normal in size without focal abnormality. Adrenals/Urinary Tract: Adrenal glands are unremarkable. No renal mass. 10 mm nonobstructing left renal calculus. 7 mm nonobstructing right lower pole renal calculus. No obstructive uropathy. Bladder is unremarkable. Stomach/Bowel: 4.2 x 2.7 cm complex fluid collection in the right mid abdomen with an air-fluid level most consistent with an abscess. Small volume pneumoperitoneum most consistent with perforated viscus. The complex fluid collection abuts a sigmoid diverticula without significant colonic wall thickening or surrounding inflammatory changes. Small bowel dilatation measuring up to 3.8 cm with a relative transition point in the right mid abdomen. Moderate amount of pelvic free fluid. Vascular/Lymphatic: Normal caliber abdominal aorta with atherosclerosis. No lymphadenopathy. Reproductive: Status post hysterectomy. No adnexal masses. Other: No abdominal  wall hernia. Musculoskeletal: No lytic or sclerotic osseous lesion. Severe degenerative disc disease with disc height loss at L5-S1. Grade 1 anterolisthesis of L5 on S1 secondary to facet disease. Right total hip arthroplasty. Mild osteoarthritis of the left hip. IMPRESSION: 1. 4.2 x 2.7 cm complex fluid collection in the right mid abdomen with an air-fluid level most consistent with an abscess. Small volume pneumoperitoneum most consistent with perforated viscus. The complex fluid collection abuts a sigmoid diverticula without significant colonic wall thickening or surrounding inflammatory changes. Small bowel dilatation measuring up to 3.8 cm with a relative transition point in the right mid abdomen. Differential considerations include perforated diverticulitis versus perforated enteritis with an abscess in the right mid abdomen resulting in small bowel obstruction. 2. Bilateral nonobstructing renal calculi. 3. Cholelithiasis. Critical Value/emergent results were called by telephone at the time of interpretation on 04/21/2016 at  3:45 pm to [facility] , who verbally acknowledged these results. Electronically Signed   By: Elige Ko   On: 04/21/2016 15:46    Problem List: Patient Active Problem List   Diagnosis Date Noted  . Postop Hypokalemia 07/10/2011  . Primary osteoarthritis of right hip 07/08/2011    Assessment & Plan: Perforation near a diverticulum of the colon vs possible small bowel perforation.  Suspect the former.  She hasn't a true allergy to penicillin as she noted this as a child and she thinks that she can take penicillin like drugs.  Will begin observation and Zosyn and hold Xarelto.      Matt B. Daphine Deutscher, MD, [facility] [phone] beeper [phone]  04/21/2016 5:56 PM

## 2016-04-21 NOTE — ED Triage Notes (Signed)
Pt c/o generalized abdominal pain, distention, diaphoresis. CT scan shows concern for possible abscess, possible perforation.

## 2016-04-22 ENCOUNTER — Inpatient Hospital Stay (HOSPITAL_COMMUNITY): Payer: Medicare Other

## 2016-04-22 LAB — BASIC METABOLIC PANEL
ANION GAP: 8 (ref 5–15)
BUN: 26 mg/dL — ABNORMAL HIGH (ref 6–20)
CHLORIDE: 101 mmol/L (ref 101–111)
CO2: 26 mmol/L (ref 22–32)
CREATININE: 0.68 mg/dL (ref 0.44–1.00)
Calcium: 9 mg/dL (ref 8.9–10.3)
GFR calc non Af Amer: >60 mL/min (ref >60)
Glucose, Bld: 139 mg/dL — ABNORMAL HIGH (ref 65–99)
POTASSIUM: 4.6 mmol/L (ref 3.5–5.1)
SODIUM: 135 mmol/L (ref 135–145)

## 2016-04-22 LAB — CBC
HEMATOCRIT: 42.9 % (ref 36.0–46.0)
HEMOGLOBIN: 14.3 g/dL (ref 12.0–15.0)
MCH: 31.1 pg (ref 26.0–34.0)
MCHC: 33.3 g/dL (ref 30.0–36.0)
MCV: 93.3 fL (ref 78.0–100.0)
Platelets: 284 10*3/uL (ref 150–400)
RBC: 4.6 MIL/uL (ref 3.87–5.11)
RDW: 13.8 % (ref 11.5–15.5)
WBC: 10.9 10*3/uL — AB (ref 4.0–10.5)

## 2016-04-22 MED ORDER — ENOXAPARIN SODIUM 40 MG/0.4ML ~~LOC~~ SOLN
40.0000 mg | SUBCUTANEOUS | Status: DC
  Administered 2016-04-22 – 2016-05-06 (×15): 40 mg via SUBCUTANEOUS
  Filled 2016-04-22 (×15): qty 0.4

## 2016-04-22 NOTE — Progress Notes (Addendum)
Subjective: Unchanged, no flatus/stool, no n/v  Objective: Vital signs in last 24 hours: Temp:  [98.1 F (36.7 C)-98.4 F (36.9 C)] 98.4 F (36.9 C) (09/20 0534) Pulse Rate:  [80-103] 99 (09/20 0534) Resp:  [14-21] 18 (09/20 0534) BP: (133-172)/(47-92) 134/66 (09/20 0534) SpO2:  [96 %-100 %] 96 % (09/20 0534) Weight:  [81.8 kg (180 lb 5.4 oz)] 81.8 kg (180 lb 5.4 oz) (09/19 2012)    Intake/Output from previous day: 09/19 0701 - 09/20 0700 In: 646.7 [I.V.:446.7; IV Piggyback:200] Out: -  Intake/Output this shift: No intake/output data recorded.  Resp: clear to auscultation bilaterally Cardio: regular rate and rhythm GI: mild tender right mid abdomen, no peritoneal signs, bs occasional, nondistended  Lab Results:   Recent Labs  04/21/16 1735  WBC 19.5*  HGB 15.8*  HCT 45.5  PLT 264   BMET  Recent Labs  04/21/16 1735  NA 135  K 4.3  CL 97*  CO2 26  GLUCOSE 155*  BUN 31*  CREATININE 0.81  CALCIUM 9.4   PT/INR No results for input(s): LABPROT, INR in the last 72 hours. ABG No results for input(s): PHART, HCO3 in the last 72 hours.  Invalid input(s): PCO2, PO2  Studies/Results: Ct Abdomen Pelvis W Contrast  Result Date: 04/21/2016 CLINICAL DATA:  Abdominal fullness, bloating of the right upper and lower quadrants. Nausea. EXAM: CT ABDOMEN AND PELVIS WITH CONTRAST TECHNIQUE: Multidetector CT imaging of the abdomen and pelvis was performed using the standard protocol following bolus administration of intravenous contrast. CONTRAST:  100mL ISOVUE-300 IOPAMIDOL (ISOVUE-300) INJECTION 61% COMPARISON:  None. FINDINGS: Lower chest: Lung bases are clear. Hepatobiliary: No focal hepatic mass.  Cholelithiasis. Pancreas: Unremarkable. No pancreatic ductal dilatation or surrounding inflammatory changes. Spleen: Normal in size without focal abnormality. Adrenals/Urinary Tract: Adrenal glands are unremarkable. No renal mass. 10 mm nonobstructing left renal calculus. 7 mm  nonobstructing right lower pole renal calculus. No obstructive uropathy. Bladder is unremarkable. Stomach/Bowel: 4.2 x 2.7 cm complex fluid collection in the right mid abdomen with an air-fluid level most consistent with an abscess. Small volume pneumoperitoneum most consistent with perforated viscus. The complex fluid collection abuts a sigmoid diverticula without significant colonic wall thickening or surrounding inflammatory changes. Small bowel dilatation measuring up to 3.8 cm with a relative transition point in the right mid abdomen. Moderate amount of pelvic free fluid. Vascular/Lymphatic: Normal caliber abdominal aorta with atherosclerosis. No lymphadenopathy. Reproductive: Status post hysterectomy. No adnexal masses. Other: No abdominal wall hernia. Musculoskeletal: No lytic or sclerotic osseous lesion. Severe degenerative disc disease with disc height loss at L5-S1. Grade 1 anterolisthesis of L5 on S1 secondary to facet disease. Right total hip arthroplasty. Mild osteoarthritis of the left hip. IMPRESSION: 1. 4.2 x 2.7 cm complex fluid collection in the right mid abdomen with an air-fluid level most consistent with an abscess. Small volume pneumoperitoneum most consistent with perforated viscus. The complex fluid collection abuts a sigmoid diverticula without significant colonic wall thickening or surrounding inflammatory changes. Small bowel dilatation measuring up to 3.8 cm with a relative transition point in the right mid abdomen. Differential considerations include perforated diverticulitis versus perforated enteritis with an abscess in the right mid abdomen resulting in small bowel obstruction. 2. Bilateral nonobstructing renal calculi. 3. Cholelithiasis. Critical Value/emergent results were called by telephone at the time of interpretation on 04/21/2016 at 3:45 pm to [facility] , who verbally acknowledged these results. Electronically Signed   By: Elige KoHetal  Patel   On: 04/21/2016 15:46  Anti-infectives: Anti-infectives    Start     Dose/Rate Route Frequency Ordered Stop   04/21/16 2015  piperacillin-tazobactam (ZOSYN) IVPB 3.375 g     3.375 g 12.5 mL/hr over 240 Minutes Intravenous Every 8 hours 04/21/16 2014     04/21/16 1700  ciprofloxacin (CIPRO) IVPB 400 mg     400 mg 200 mL/hr over 60 Minutes Intravenous  Once 04/21/16 1652 04/21/16 1952   04/21/16 1700  metroNIDAZOLE (FLAGYL) IVPB 500 mg     500 mg 100 mL/hr over 60 Minutes Intravenous  Once 04/21/16 1652 04/21/16 1834   04/21/16 1645  clindamycin (CLEOCIN) IVPB 300 mg  Status:  Discontinued     300 mg 100 mL/hr over 30 Minutes Intravenous  Once 04/21/16 1643 04/21/16 1651   04/21/16 1645  clindamycin (CLEOCIN) IVPB 600 mg  Status:  Discontinued     600 mg 100 mL/hr over 30 Minutes Intravenous  Once 04/21/16 1644 04/21/16 1651      Assessment/Plan: 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  I think reasonable to check films if obstructed will need ng, continue abx, may very well end up needing surgery for this but will try to manage conservatively for now   Addendum:  She has sbo on plain film. Will place ng, still doesn't appear ill on exam despite ct and plain films, will give her another 24 hours  Haizlee Henton 04/22/2016

## 2016-04-22 NOTE — Care Management Note (Signed)
Case Management Note  Patient Details  Name: Priscille Kluvernn T Chipps MRN: [id number] Date of Birth: 04/26/1941  Subjective/Objective:  75 yo admitted with Perforated viscus. For conservative management at this time.                   Action/Plan: From home with spouse. CM will continue to follow.  Expected Discharge Date:   (unknown)               Expected Discharge Plan:  Home/Self Care  In-House Referral:     Discharge planning Services  CM Consult  Post Acute Care Choice:    Choice offered to:     DME Arranged:    DME Agency:     HH Arranged:    HH Agency:     Status of Service:  In process, will continue to follow  If discussed at Long Length of Stay Meetings, dates discussed:    Additional CommentsBartholome Bill:  Demar Shad H, RN 04/22/2016, 12:55 PM  [phone]

## 2016-04-23 ENCOUNTER — Inpatient Hospital Stay (HOSPITAL_COMMUNITY): Payer: Medicare Other

## 2016-04-23 LAB — CBC
HEMATOCRIT: 39 % (ref 36.0–46.0)
HEMOGLOBIN: 13 g/dL (ref 12.0–15.0)
MCH: 31.3 pg (ref 26.0–34.0)
MCHC: 33.3 g/dL (ref 30.0–36.0)
MCV: 94 fL (ref 78.0–100.0)
Platelets: 246 10*3/uL (ref 150–400)
RBC: 4.15 MIL/uL (ref 3.87–5.11)
RDW: 13.9 % (ref 11.5–15.5)
WBC: 6.3 10*3/uL (ref 4.0–10.5)

## 2016-04-23 MED ORDER — FAMOTIDINE IN NACL 20-0.9 MG/50ML-% IV SOLN
20.0000 mg | Freq: Two times a day (BID) | INTRAVENOUS | Status: DC | PRN
  Administered 2016-04-23 – 2016-04-27 (×4): 20 mg via INTRAVENOUS
  Filled 2016-04-23 (×4): qty 50

## 2016-04-23 NOTE — Progress Notes (Signed)
Patient ID: Monique Monique [name] T Monique [name], female   DOB: 08/16/1940, 75 y.o.   MRN: [id number]  [facility]     Subjective: Feeling a little better this morning. Abdominal pain is less. Denies n/v. States that she does still feel bloated. Passing a little bit of gas.  Objective: Vital signs in last 24 hours: Temp:  [97.5 F (36.4 C)-99.3 F (37.4 C)] 98.4 F (36.9 C) (09/21 0557) Pulse Rate:  [84-105] 84 (09/21 0557) Resp:  [16-18] 16 (09/21 0557) BP: (132-181)/(68-84) 132/68 (09/21 0557) SpO2:  [95 %-100 %] 95 % (09/21 0557) Last BM Date: 04/21/16  Intake/Output from previous day: 09/20 0701 - 09/21 0700 In: 3644.7 [P.O.:1315; I.V.:2229.7; IV Piggyback:100] Out: 2850 [Emesis/NG output:2850] Intake/Output this shift: No intake/output data recorded.  PE: Gen:  Alert, NAD, pleasant Abd: Soft, mildly distended and minimally tender, hypoactive BS  NGT 2850cc output yesterday  Lab Results:   Recent Labs  04/22/16 0748 04/23/16 0427  WBC 10.9* 6.3  HGB 14.3 13.0  HCT 42.9 39.0  PLT 284 246   BMET  Recent Labs  04/21/16 1735 04/22/16 0748  NA 135 135  K 4.3 4.6  CL 97* 101  CO2 26 26  GLUCOSE 155* 139*  BUN 31* 26*  CREATININE 0.81 0.68  CALCIUM 9.4 9.0   PT/INR No results for input(s): LABPROT, INR in the last 72 hours. CMP     Component Value Date/Time   NA 135 04/22/2016 0748   K 4.6 04/22/2016 0748   CL 101 04/22/2016 0748   CO2 26 04/22/2016 0748   GLUCOSE 139 (H) 04/22/2016 0748   BUN 26 (H) 04/22/2016 0748   CREATININE 0.68 04/22/2016 0748   CALCIUM 9.0 04/22/2016 0748   PROT 6.9 04/21/2016 1735   ALBUMIN 3.5 04/21/2016 1735   AST 19 04/21/2016 1735   ALT 23 04/21/2016 1735   ALKPHOS 60 04/21/2016 1735   BILITOT 1.2 04/21/2016 1735   GFRNONAA >60 04/22/2016 0748   GFRAA >60 04/22/2016 0748   Lipase     Component Value Date/Time   LIPASE 19 04/21/2016 1735       Studies/Results: Dg Abd 1 View  Result Date:  04/23/2016 CLINICAL DATA:  Small-bowel obstruction EXAM: ABDOMEN - 1 VIEW COMPARISON:  04/22/2016 FINDINGS: Interval placement of NG tube with the tip in the second portion duodenum. Diffusely dilated small bowel loops slightly improved. Stool in the colon. Colon decompressed. Numerous calcified gallstones. Right renal stone. Left renal stone. IMPRESSION: NG tube in the duodenum. Small-bowel obstruction pattern with mild improvement since yesterday. Electronically Signed   By: Marlan Palauharles  Clark M.D.   On: 04/23/2016 08:04   Ct Abdomen Pelvis W Contrast  Result Date: 04/21/2016 CLINICAL DATA:  Abdominal fullness, bloating of the right upper and lower quadrants. Nausea. EXAM: CT ABDOMEN AND PELVIS WITH CONTRAST TECHNIQUE: Multidetector CT imaging of the abdomen and pelvis was performed using the standard protocol following bolus administration of intravenous contrast. CONTRAST:  100mL ISOVUE-300 IOPAMIDOL (ISOVUE-300) INJECTION 61% COMPARISON:  None. FINDINGS: Lower chest: Lung bases are clear. Hepatobiliary: No focal hepatic mass.  Cholelithiasis. Pancreas: Unremarkable. No pancreatic ductal dilatation or surrounding inflammatory changes. Spleen: Normal in size without focal abnormality. Adrenals/Urinary Tract: Adrenal glands are unremarkable. No renal mass. 10 mm nonobstructing left renal calculus. 7 mm nonobstructing right lower pole renal calculus. No obstructive uropathy. Bladder is unremarkable. Stomach/Bowel: 4.2 x 2.7 cm complex fluid collection in the right mid abdomen with an air-fluid level most consistent with an abscess.  Small volume pneumoperitoneum most consistent with perforated viscus. The complex fluid collection abuts a sigmoid diverticula without significant colonic wall thickening or surrounding inflammatory changes. Small bowel dilatation measuring up to 3.8 cm with a relative transition point in the right mid abdomen. Moderate amount of pelvic free fluid. Vascular/Lymphatic: Normal caliber  abdominal aorta with atherosclerosis. No lymphadenopathy. Reproductive: Status post hysterectomy. No adnexal masses. Other: No abdominal wall hernia. Musculoskeletal: No lytic or sclerotic osseous lesion. Severe degenerative disc disease with disc height loss at L5-S1. Grade 1 anterolisthesis of L5 on S1 secondary to facet disease. Right total hip arthroplasty. Mild osteoarthritis of the left hip. IMPRESSION: 1. 4.2 x 2.7 cm complex fluid collection in the right mid abdomen with an air-fluid level most consistent with an abscess. Small volume pneumoperitoneum most consistent with perforated viscus. The complex fluid collection abuts a sigmoid diverticula without significant colonic wall thickening or surrounding inflammatory changes. Small bowel dilatation measuring up to 3.8 cm with a relative transition point in the right mid abdomen. Differential considerations include perforated diverticulitis versus perforated enteritis with an abscess in the right mid abdomen resulting in small bowel obstruction. 2. Bilateral nonobstructing renal calculi. 3. Cholelithiasis. Critical Value/emergent results were called by telephone at the time of interpretation on 04/21/2016 at 3:45 pm to [facility] , who verbally acknowledged these results. Electronically Signed   By: Elige Ko   On: 04/21/2016 15:46   Dg Abd 2 Views  Result Date: 04/22/2016 CLINICAL DATA:  Abdominal distention, bloating, small-bowel obstruction EXAM: ABDOMEN - 2 VIEW COMPARISON:  CT abdomen pelvis of 04/21/2016 FINDINGS: Supine and erect views of the abdomen were obtained. There is persistent high-grade small bowel obstruction with only a small amount of air within the rectum. There is also free air noted which was described on the CT abdomen pelvis report from yesterday most likely indicating perforation. Fluid is noted within the stomach. Bilateral renal calculi noted and gallstones are present incidentally. Right hip replacement is noted.  IMPRESSION: 1. High-grade small bowel obstruction. 2. Free intraperitoneal air as described on yesterday's CT of the abdomen pelvis most likely indicating perforation of the bowel. 3. Bilateral renal calculi. Electronically Signed   By: Dwyane Dee M.D.   On: 04/22/2016 11:57    Anti-infectives: Anti-infectives    Start     Dose/Rate Route Frequency Ordered Stop   04/21/16 2015  piperacillin-tazobactam (ZOSYN) IVPB 3.375 g     3.375 g 12.5 mL/hr over 240 Minutes Intravenous Every 8 hours 04/21/16 2014     04/21/16 1700  ciprofloxacin (CIPRO) IVPB 400 mg     400 mg 200 mL/hr over 60 Minutes Intravenous  Once 04/21/16 1652 04/21/16 1952   04/21/16 1700  metroNIDAZOLE (FLAGYL) IVPB 500 mg     500 mg 100 mL/hr over 60 Minutes Intravenous  Once 04/21/16 1652 04/21/16 1834   04/21/16 1645  clindamycin (CLEOCIN) IVPB 300 mg  Status:  Discontinued     300 mg 100 mL/hr over 30 Minutes Intravenous  Once 04/21/16 1643 04/21/16 1651   04/21/16 1645  clindamycin (CLEOCIN) IVPB 600 mg  Status:  Discontinued     600 mg 100 mL/hr over 30 Minutes Intravenous  Once 04/21/16 1644 04/21/16 1651       Assessment/Plan Perf diverticular disease vs small bowel - XR today: Small-bowel obstruction pattern with mild improvement since yesterday - +flatus, no BM - NGT 2850cc output yesterday - WBC normal today 6.3, afebrile  ID - cipro 9/19>>9/19, flagyl 9/19>>9/19, zosyn 9/19>>  VTE - lovenox FEN - NPO, IVF  Plan - SBO resolving. Continue NGT. Recheck XR in a.m.   LOS: 2 days    Edson Snowball , [facility] Surgery 04/23/2016, 8:52 AM Pager: [phone] Consults: [phone] Mon-Fri 7:00 am-4:30 pm Sat-Sun 7:00 am-11:30 am

## 2016-04-24 ENCOUNTER — Inpatient Hospital Stay (HOSPITAL_COMMUNITY): Payer: Medicare Other

## 2016-04-24 LAB — CBC
HEMATOCRIT: 36.5 % (ref 36.0–46.0)
Hemoglobin: 12.1 g/dL (ref 12.0–15.0)
MCH: 31.3 pg (ref 26.0–34.0)
MCHC: 33.2 g/dL (ref 30.0–36.0)
MCV: 94.6 fL (ref 78.0–100.0)
Platelets: 230 10*3/uL (ref 150–400)
RBC: 3.86 MIL/uL — AB (ref 3.87–5.11)
RDW: 13.6 % (ref 11.5–15.5)
WBC: 5 10*3/uL (ref 4.0–10.5)

## 2016-04-24 NOTE — Progress Notes (Signed)
Subjective: She is feeling better, no pain.  She is having BM's now.  Complains she is hungry.    Objective: Vital signs in last 24 hours: Temp:  [97.4 F (36.3 C)-98.3 F (36.8 C)] 98 F (36.7 C) (09/22 0558) Pulse Rate:  [75-98] 75 (09/22 0558) Resp:  [16] 16 (09/22 0558) BP: (121-150)/(59-81) 144/76 (09/22 0558) SpO2:  [93 %-99 %] 97 % (09/22 0558) Last BM Date: 04/23/16 2600 IV fluids NPO NG 750 BM x 2 yesterday and 1 this AM   Afebrile, VSS WBC remains normal  ABD film  NG in duodenum, persistent SB dilatation  Intake/Output from previous day: 09/21 0701 - 09/22 0700 In: 2581.7 [I.V.:2131.7; NG/GT:350; IV Piggyback:100] Out: 1800 [Urine:1050; Emesis/NG output:750] Intake/Output this shift: No intake/output data recorded.  General appearance: alert, cooperative and no distress Resp: clear to auscultation bilaterally GI: soft, nontender to palpation in all quadrants, + BS and BM.  Lab Results:   Recent Labs  04/23/16 0427 04/24/16 0349  WBC 6.3 5.0  HGB 13.0 12.1  HCT 39.0 36.5  PLT 246 230    BMET  Recent Labs  04/21/16 1735 04/22/16 0748  NA 135 135  K 4.3 4.6  CL 97* 101  CO2 26 26  GLUCOSE 155* 139*  BUN 31* 26*  CREATININE 0.81 0.68  CALCIUM 9.4 9.0   PT/INR No results for input(s): LABPROT, INR in the last 72 hours.   Recent Labs Lab 04/21/16 1735  AST 19  ALT 23  ALKPHOS 60  BILITOT 1.2  PROT 6.9  ALBUMIN 3.5     Lipase     Component Value Date/Time   LIPASE 19 04/21/2016 1735     Studies/Results: Dg Abd 1 View  Result Date: 04/24/2016 CLINICAL DATA:  Small-bowel obstruction EXAM: ABDOMEN - 1 VIEW COMPARISON:  04/23/2016 FINDINGS: Nasogastric catheter remains in the second portion of the duodenum. There is persistent small bowel dilatation throughout the abdomen consistent with the given clinical history. No free air is seen. Multiple calcifications are noted in the right upper quadrant consistent with cholelithiasis.  Bilateral renal calculi are noted. IMPRESSION: Stable appearance of the abdomen with persistent small bowel dilatation. Bilateral renal calculi and cholelithiasis. Electronically Signed   By: Alcide Clever M.D.   On: 04/24/2016 07:39   Dg Abd 1 View  Result Date: 04/23/2016 CLINICAL DATA:  Small-bowel obstruction EXAM: ABDOMEN - 1 VIEW COMPARISON:  04/22/2016 FINDINGS: Interval placement of NG tube with the tip in the second portion duodenum. Diffusely dilated small bowel loops slightly improved. Stool in the colon. Colon decompressed. Numerous calcified gallstones. Right renal stone. Left renal stone. IMPRESSION: NG tube in the duodenum. Small-bowel obstruction pattern with mild improvement since yesterday. Electronically Signed   By: Marlan Palau M.D.   On: 04/23/2016 08:04   Dg Abd 2 Views  Result Date: 04/22/2016 CLINICAL DATA:  Abdominal distention, bloating, small-bowel obstruction EXAM: ABDOMEN - 2 VIEW COMPARISON:  CT abdomen pelvis of 04/21/2016 FINDINGS: Supine and erect views of the abdomen were obtained. There is persistent high-grade small bowel obstruction with only a small amount of air within the rectum. There is also free air noted which was described on the CT abdomen pelvis report from yesterday most likely indicating perforation. Fluid is noted within the stomach. Bilateral renal calculi noted and gallstones are present incidentally. Right hip replacement is noted. IMPRESSION: 1. High-grade small bowel obstruction. 2. Free intraperitoneal air as described on yesterday's CT of the abdomen pelvis most likely indicating perforation  of the bowel. 3. Bilateral renal calculi. Electronically Signed   By: Dwyane DeePaul  Barry M.D.   On: 04/22/2016 11:57   Prior to Admission medications   Medication Sig Start Date End Date Taking? Authorizing Provider  aspirin EC 81 MG tablet Take 81 mg by mouth daily.   Yes Historical Provider, MD  cetirizine (ZYRTEC) 10 MG tablet Take 10 mg by mouth at bedtime.     Yes Historical Provider, MD  lisinopril (PRINIVIL,ZESTRIL) 20 MG tablet Take 20 mg by mouth every morning.    Yes Historical Provider, MD  oxyCODONE-acetaminophen (PERCOCET) 10-325 MG tablet Take 1 tablet by mouth every 6 (six) hours as needed for pain.   Yes Historical Provider, MD  simvastatin (ZOCOR) 40 MG tablet Take 40 mg by mouth at bedtime.    Yes Historical Provider, MD    Medications: . enoxaparin (LOVENOX) injection  40 mg Subcutaneous Q24H  . lisinopril  20 mg Oral Daily  . piperacillin-tazobactam (ZOSYN)  IV  3.375 g Intravenous Q8H   . dextrose 5 % and 0.45 % NaCl with KCl 20 mEq/L 100 mL/hr at 04/24/16 0209    Assessment/Plan Perforated diverticular disease - afebrile, normal WBC, no pain SBO - _+BM x 3, no distension or discomfort FEN:NPO/IV fluids ID:  Day 4 Zosyn DVT: Lovenox/SCD  Plan:  Clamping trials and sips of clears from the floor.  Consider pulling NG later today or in AM if she does well.   LOS: 3 days    Juanito Gonyer 04/24/2016 [phone]

## 2016-04-25 ENCOUNTER — Inpatient Hospital Stay (HOSPITAL_COMMUNITY): Payer: Medicare Other

## 2016-04-25 DIAGNOSIS — K651 Peritoneal abscess: Secondary | ICD-10-CM

## 2016-04-25 NOTE — Progress Notes (Signed)
Subjective: Feels more bloated since ng clamped.  Having some BMs and passing a little gas.  Objective: Vital signs in last 24 hours: Temp:  [97.9 F (36.6 C)-98.4 F (36.9 C)] 98.4 F (36.9 C) (09/23 0543) Pulse Rate:  [94-98] 94 (09/23 0543) Resp:  [16-17] 17 (09/23 0543) BP: (143-160)/(77-84) 143/78 (09/23 0543) SpO2:  [97 %] 97 % (09/23 0543) Last BM Date: 04/24/16  Intake/Output from previous day: 09/22 0701 - 09/23 0700 In: 2581.2 [P.O.:1040; I.V.:1395.8; IV Piggyback:145.4] Out: 1350 [Urine:600; Emesis/NG output:750] Intake/Output this shift: No intake/output data recorded.  PE: General- In NAD Abdomen-soft and distended, hypoactive bowel sounds, no tenderness  Lab Results:   Recent Labs  04/23/16 0427 04/24/16 0349  WBC 6.3 5.0  HGB 13.0 12.1  HCT 39.0 36.5  PLT 246 230   BMET No results for input(s): NA, K, CL, CO2, GLUCOSE, BUN, CREATININE, CALCIUM in the last 72 hours. PT/INR No results for input(s): LABPROT, INR in the last 72 hours. Comprehensive Metabolic Panel:    Component Value Date/Time   NA 135 04/22/2016 0748   NA 135 04/21/2016 1735   K 4.6 04/22/2016 0748   K 4.3 04/21/2016 1735   CL 101 04/22/2016 0748   CL 97 (L) 04/21/2016 1735   CO2 26 04/22/2016 0748   CO2 26 04/21/2016 1735   BUN 26 (H) 04/22/2016 0748   BUN 31 (H) 04/21/2016 1735   CREATININE 0.68 04/22/2016 0748   CREATININE 0.81 04/21/2016 1735   GLUCOSE 139 (H) 04/22/2016 0748   GLUCOSE 155 (H) 04/21/2016 1735   CALCIUM 9.0 04/22/2016 0748   CALCIUM 9.4 04/21/2016 1735   AST 19 04/21/2016 1735   ALT 23 04/21/2016 1735   ALKPHOS 60 04/21/2016 1735   BILITOT 1.2 04/21/2016 1735   PROT 6.9 04/21/2016 1735   ALBUMIN 3.5 04/21/2016 1735     Studies/Results: Dg Abd 1 View  Result Date: 04/24/2016 CLINICAL DATA:  Small-bowel obstruction EXAM: ABDOMEN - 1 VIEW COMPARISON:  04/23/2016 FINDINGS: Nasogastric catheter remains in the second portion of the duodenum. There  is persistent small bowel dilatation throughout the abdomen consistent with the given clinical history. No free air is seen. Multiple calcifications are noted in the right upper quadrant consistent with cholelithiasis. Bilateral renal calculi are noted. IMPRESSION: Stable appearance of the abdomen with persistent small bowel dilatation. Bilateral renal calculi and cholelithiasis. Electronically Signed   By: Alcide Clever M.D.   On: 04/24/2016 07:39    Anti-infectives: Anti-infectives    Start     Dose/Rate Route Frequency Ordered Stop   04/21/16 2015  piperacillin-tazobactam (ZOSYN) IVPB 3.375 g     3.375 g 12.5 mL/hr over 240 Minutes Intravenous Every 8 hours 04/21/16 2014     04/21/16 1700  ciprofloxacin (CIPRO) IVPB 400 mg     400 mg 200 mL/hr over 60 Minutes Intravenous  Once 04/21/16 1652 04/21/16 1952   04/21/16 1700  metroNIDAZOLE (FLAGYL) IVPB 500 mg     500 mg 100 mL/hr over 60 Minutes Intravenous  Once 04/21/16 1652 04/21/16 1834   04/21/16 1645  clindamycin (CLEOCIN) IVPB 300 mg  Status:  Discontinued     300 mg 100 mL/hr over 30 Minutes Intravenous  Once 04/21/16 1643 04/21/16 1651   04/21/16 1645  clindamycin (CLEOCIN) IVPB 600 mg  Status:  Discontinued     600 mg 100 mL/hr over 30 Minutes Intravenous  Once 04/21/16 1644 04/21/16 1651      Assessment Diverticulitis with abscess and concomitant ileus  or sbo-more bloated this AM; WBC normal; no peritonitis    LOS: 4 days   Plan: Continue ng clamping trials.  Check abd x-rays.  Continue IV abxs.   Monique [name] Shela CommonsJ 04/25/2016

## 2016-04-26 ENCOUNTER — Inpatient Hospital Stay (HOSPITAL_COMMUNITY): Payer: Medicare Other

## 2016-04-26 ENCOUNTER — Encounter (HOSPITAL_COMMUNITY): Payer: Self-pay | Admitting: Surgery

## 2016-04-26 DIAGNOSIS — K56609 Unspecified intestinal obstruction, unspecified as to partial versus complete obstruction: Secondary | ICD-10-CM

## 2016-04-26 DIAGNOSIS — I10 Essential (primary) hypertension: Secondary | ICD-10-CM | POA: Diagnosis present

## 2016-04-26 MED ORDER — PHENOL 1.4 % MT LIQD
2.0000 | OROMUCOSAL | Status: DC | PRN
  Filled 2016-04-26: qty 177

## 2016-04-26 MED ORDER — HYDRALAZINE HCL 20 MG/ML IJ SOLN: 5.0000 mg | Freq: Four times a day (QID) | INTRAMUSCULAR | Status: DC | PRN

## 2016-04-26 MED ORDER — IOPAMIDOL (ISOVUE-300) INJECTION 61%
30.0000 mL | Freq: Once | INTRAVENOUS | Status: AC | PRN
  Administered 2016-04-26: 30 mL via ORAL

## 2016-04-26 MED ORDER — MENTHOL 3 MG MT LOZG
1.0000 | LOZENGE | OROMUCOSAL | Status: DC | PRN
  Filled 2016-04-26 (×2): qty 9

## 2016-04-26 MED ORDER — PHENOL 1.4 % MT LIQD: 1.0000 | OROMUCOSAL | Status: DC | PRN

## 2016-04-26 MED ORDER — BISACODYL 10 MG RE SUPP: 10.0000 mg | Freq: Two times a day (BID) | RECTAL | Status: DC | PRN

## 2016-04-26 MED ORDER — IOPAMIDOL (ISOVUE-300) INJECTION 61%
100.0000 mL | Freq: Once | INTRAVENOUS | Status: AC | PRN
  Administered 2016-04-26: 100 mL via INTRAVENOUS

## 2016-04-26 MED ORDER — METHOCARBAMOL 1000 MG/10ML IJ SOLN
1000.0000 mg | Freq: Four times a day (QID) | INTRAMUSCULAR | Status: DC | PRN
  Filled 2016-04-26: qty 10

## 2016-04-26 MED ORDER — ACETAMINOPHEN 650 MG RE SUPP
650.0000 mg | Freq: Four times a day (QID) | RECTAL | Status: DC | PRN
  Filled 2016-04-26: qty 1

## 2016-04-26 MED ORDER — MAGIC MOUTHWASH
15.0000 mL | Freq: Four times a day (QID) | ORAL | Status: DC | PRN
  Administered 2016-05-08 – 2016-05-18 (×7): 15 mL via ORAL
  Filled 2016-04-26 (×11): qty 15

## 2016-04-26 MED ORDER — LACTATED RINGERS IV BOLUS (SEPSIS): 1000.0000 mL | Freq: Three times a day (TID) | INTRAVENOUS | Status: DC | PRN

## 2016-04-26 MED ORDER — LIP MEDEX EX OINT
1.0000 "application " | TOPICAL_OINTMENT | Freq: Two times a day (BID) | CUTANEOUS | Status: DC
  Administered 2016-04-27 – 2016-05-19 (×40): 1 via TOPICAL
  Filled 2016-04-26 (×9): qty 7

## 2016-04-26 MED ORDER — DIPHENHYDRAMINE HCL 50 MG/ML IJ SOLN: 12.5000 mg | Freq: Four times a day (QID) | INTRAMUSCULAR | Status: DC | PRN

## 2016-04-26 MED ORDER — ENALAPRILAT 1.25 MG/ML IV SOLN
0.6250 mg | Freq: Four times a day (QID) | INTRAVENOUS | Status: DC | PRN
  Filled 2016-04-26: qty 1

## 2016-04-26 MED ORDER — METOPROLOL TARTRATE 5 MG/5ML IV SOLN: 5.0000 mg | Freq: Four times a day (QID) | INTRAVENOUS | Status: DC | PRN

## 2016-04-26 MED ORDER — PROCHLORPERAZINE EDISYLATE 5 MG/ML IJ SOLN
5.0000 mg | INTRAMUSCULAR | Status: DC | PRN
  Administered 2016-04-27: 10 mg via INTRAVENOUS
  Filled 2016-04-26: qty 2

## 2016-04-26 MED ORDER — ALUM & MAG HYDROXIDE-SIMETH 200-200-20 MG/5ML PO SUSP
30.0000 mL | Freq: Four times a day (QID) | ORAL | Status: DC | PRN
  Administered 2016-04-27 – 2016-05-16 (×14): 30 mL via ORAL
  Filled 2016-04-26 (×14): qty 30

## 2016-04-26 NOTE — Progress Notes (Addendum)
[facility]  [address]  [place], [place] [address] Phone: [phone] FAX: [phone]   Monique [name] [id number] Dec 05, 1940  CARE TEAM:  PCP: [name], FNP  Outpatient Care Team: Patient Care Team: [name], FNP as PCP - General (Nurse Practitioner)  Inpatient Treatment Team: Treatment Team: Attending Provider: Bishop Limbo, MD; Consulting Physician: Md Montez Morita, MD; Technician: Nicolette Bang, NT; Technician: Almyra Brace, NT; Registered Nurse: Shelly Rubenstein, RN (Inactive); Registered Nurse: Gerda Diss, RN; Technician: Algernon Huxley, NT  Problem List:   Active Problems:   Diverticulitis of sigmoid colon   Abscess of peritoneum (HCC)            Assessment  Abd abscess, presumed diverticular.  intraloop not amenable to perc drainage on initial eval.  PSBO as a result  Plan:  -IV Zosyn -repeat CT scan (last one 5d ago) to re-eval abscess ?better/worse.  Drainage now? -if abscess smaller & no full SBO, d/c NGT & adv to full liquids -HTN control - IV until ileus resolves -VTE prophylaxis- SCDs, etc -mobilize as tolerated to help recovery  Ardeth Sportsman, M.D., F.A.C.S. Gastrointestinal and Minimally Invasive Surgery [facility] [address] [place], [place] [address] [phone] Main / Paging   04/26/2016  Subjective:  Feels better Less pain Walking in room  Objective:  Vital signs:  Vitals:   04/25/16 0543 04/25/16 1324 04/25/16 2109 04/26/16 0511  BP: (!) 143/78 (!) 151/72 (!) 156/81 (!) 142/75  Pulse: 94 94 (!) 109 94  Resp: 17 16 16 16   Temp: 98.4 F (36.9 C) 98.3 F (36.8 C) 98.1 F (36.7 C) 97.9 F (36.6 C)  TempSrc: Oral Oral Oral Oral  SpO2: 97% 99% 99% 99%  Weight:      Height:        Last BM Date: 04/24/16  Intake/Output   Yesterday:  09/23 0701 - 09/24 0700 In: 4396.8 [P.O.:1250; I.V.:2901.7; IV  Piggyback:245.1] Out: 2050 [Urine:950; Emesis/NG output:1100] This shift:  No intake/output data recorded.  Bowel function:  Flatus: YES  BM:  YES  Drain: NGT thick bile   Physical Exam:  General: Pt awake/alert/oriented x4 in No acute distress Eyes: PERRL, normal EOM.  Sclera clear.  No icterus Neuro: CN II-XII intact w/o focal sensory/motor deficits. Lymph: No head/neck/groin lymphadenopathy Psych:  No delerium/psychosis/paranoia HENT: Normocephalic, Mucus membranes moist.  No thrush Neck: Supple, No tracheal deviation Chest: No chest wall pain w good excursion CV:  Pulses intact.  Regular rhythm MS: Normal AROM mjr joints.  No obvious deformity Abdomen: Somewhat firm. Obese  Mildy distended.  Nontender.  No evidence of peritonitis.  No incarcerated hernias. Ext:  SCDs BLE.  No mjr edema.  No cyanosis Skin: No petechiae / purpura  Results:   Labs: No results found for this or any previous visit (from the past 48 hour(s)).  Imaging / Studies: Dg Abd 2 Views  Result Date: 04/25/2016 CLINICAL DATA:  Follow-up small bowel obstruction EXAM: ABDOMEN - 2 VIEW COMPARISON:  04/24/2016 FINDINGS: Enteric tube terminates in the proximal duodenum. Dilated loops of small bowel in the mid abdomen, suggesting small bowel obstruction. No evidence of free air under the diaphragm on the upright view. Cholelithiasis. Right hip arthroplasty. IMPRESSION: Enteric tube terminates in the proximal duodenum. Dilated loops of small bowel in the mid abdomen, suggesting small bowel obstruction. No free air. Cholelithiasis. Electronically Signed   By: Roselie Awkward.D.  On: 04/25/2016 12:49    Medications / Allergies: per chart  Antibiotics: Anti-infectives    Start     Dose/Rate Route Frequency Ordered Stop   04/21/16 2015  piperacillin-tazobactam (ZOSYN) IVPB 3.375 g     3.375 g 12.5 mL/hr over 240 Minutes Intravenous Every 8 hours 04/21/16 2014     04/21/16 1700  ciprofloxacin (CIPRO) IVPB  400 mg     400 mg 200 mL/hr over 60 Minutes Intravenous  Once 04/21/16 1652 04/21/16 1952   04/21/16 1700  metroNIDAZOLE (FLAGYL) IVPB 500 mg     500 mg 100 mL/hr over 60 Minutes Intravenous  Once 04/21/16 1652 04/21/16 1834   04/21/16 1645  clindamycin (CLEOCIN) IVPB 300 mg  Status:  Discontinued     300 mg 100 mL/hr over 30 Minutes Intravenous  Once 04/21/16 1643 04/21/16 1651   04/21/16 1645  clindamycin (CLEOCIN) IVPB 600 mg  Status:  Discontinued     600 mg 100 mL/hr over 30 Minutes Intravenous  Once 04/21/16 1644 04/21/16 1651        Note: Portions of this report may have been transcribed using voice recognition software. Every effort was made to ensure accuracy; however, inadvertent computerized transcription errors may be present.   Any transcriptional errors that result from this process are unintentional.     Ardeth SportsmanSteven C. Seif Teichert, M.D., F.A.C.S. Gastrointestinal and Minimally Invasive Surgery [facility] [address] [place], [place] [address] [phone] Main / Paging   04/26/2016

## 2016-04-26 NOTE — Progress Notes (Signed)
Flushed NG with air and checked residual around 2030- it was less than 15cc of dark yellow gastric fluid. Around 2130, pt c/o some mild nausea. She states sometimes the dilaudid can cause her to be nauseous, she has this an hour prior. Gave pt IV zofran with resolution of symptoms. Will continue to monitor for persistent nausea or vomiting and will reconnect NG to suction if this occurs. Mick SellShannon Takari Lundahl RN.

## 2016-04-27 NOTE — Care Management Important Message (Signed)
Important Message  Patient Details  Name: Monique [name] T Birdsall MRN: [id number] Date of Birth: 09/24/1940   Medicare Important Message Given:  Yes    Haskell FlirtJamison, Vielka Klinedinst 04/27/2016, 9:04 AMImportant Message  Patient Details  Name: Monique [name] T Stalling MRN: [id number] Date of Birth: 02/08/1941   Medicare Important Message Given:  Yes    Haskell FlirtJamison, Keon Pender 04/27/2016, 9:04 AM

## 2016-04-27 NOTE — Progress Notes (Signed)
General Surgery [facility]  Assessment & Plan: Diverticulitis with perforation and abscess Small bowel obstruction  Discontinue NG tube today  Trial of clear liquid diet  Continue IV Zosyn  Velora Hecklerodd M. Analysia Dungee, MD, [facility] Office: [phone]    Subjective: Patient in bed, comfortable, denies nausea or emesis.  Denies pain.  NG clamped for 24 hours.  Having BM's and passing flatus.  Denies abdominal distension.  Objective: Vital signs in last 24 hours: Temp:  [98 F (36.7 C)-98.5 F (36.9 C)] 98.2 F (36.8 C) (09/25 0547) Pulse Rate:  [98-108] 105 (09/25 0547) Resp:  [14-16] 16 (09/25 0547) BP: (98-138)/(70-79) 138/76 (09/25 0547) SpO2:  [98 %-100 %] 98 % (09/25 0547) Last BM Date: 04/26/16  Intake/Output from previous day: 09/24 0701 - 09/25 0700 In: 1305 [P.O.:1305] Out: 250 [Urine:250] Intake/Output this shift: No intake/output data recorded.  Physical Exam: HEENT - sclerae clear, mucous membranes moist Neck - soft Chest - clear bilaterally Cor - RRR Abdomen - soft, non-tender; few BS present Ext - no edema, non-tender Neuro - alert & oriented, no focal deficits  Lab Results:  No results for input(s): WBC, HGB, HCT, PLT in the last 72 hours. BMET No results for input(s): NA, K, CL, CO2, GLUCOSE, BUN, CREATININE, CALCIUM in the last 72 hours. PT/INR No results for input(s): LABPROT, INR in the last 72 hours. Comprehensive Metabolic Panel:    Component Value Date/Time   NA 135 04/22/2016 0748   NA 135 04/21/2016 1735   K 4.6 04/22/2016 0748   K 4.3 04/21/2016 1735   CL 101 04/22/2016 0748   CL 97 (L) 04/21/2016 1735   CO2 26 04/22/2016 0748   CO2 26 04/21/2016 1735   BUN 26 (H) 04/22/2016 0748   BUN 31 (H) 04/21/2016 1735   CREATININE 0.68 04/22/2016 0748   CREATININE 0.81 04/21/2016 1735   GLUCOSE 139 (H) 04/22/2016 0748   GLUCOSE 155 (H) 04/21/2016 1735   CALCIUM 9.0 04/22/2016 0748   CALCIUM 9.4  04/21/2016 1735   AST 19 04/21/2016 1735   ALT 23 04/21/2016 1735   ALKPHOS 60 04/21/2016 1735   BILITOT 1.2 04/21/2016 1735   PROT 6.9 04/21/2016 1735   ALBUMIN 3.5 04/21/2016 1735    Studies/Results: Ct Abdomen Pelvis W Contrast  Result Date: 04/26/2016 CLINICAL DATA:  75 year old female with a history of abdominal pain since Tuesday. EXAM: CT ABDOMEN AND PELVIS WITH CONTRAST TECHNIQUE: Multidetector CT imaging of the abdomen and pelvis was performed using the standard protocol following bolus administration of intravenous contrast. CONTRAST:  30mL ISOVUE-300 IOPAMIDOL (ISOVUE-300) INJECTION 61%, 100mL ISOVUE-300 IOPAMIDOL (ISOVUE-300) INJECTION 61% COMPARISON:  CT 04/21/2016 FINDINGS: Lower chest: Lung bases clear. Unremarkable appearance of the heart. No pericardial fluid/ thickening. Hepatobiliary: Unremarkable liver. Multiple radiopaque gallstones within the gallbladder. No associated inflammatory changes. No ductal dilation. Pancreas: Attenuated pancreatic parenchyma with no associated inflammation. Spleen: Unremarkable spleen Adrenals/Urinary Tract: Unremarkable appearance of the bilateral adrenal glands. No right-sided hydronephrosis. There is either an nonobstructive stone in the inferior collecting system or early excretion of contrast. No left-sided hydronephrosis. Either early excretion of contrast or nonobstructive stone in the lower pole collecting system. Unremarkable course of the bilateral ureters. Stomach/Bowel: Gastric tube terminates post pyloric in the first portion the duodenum. Air-fluid levels and dilated small bowel loops within the mid abdomen and lower abdomen. The transition point is exactly at the point of the peritoneal abscess of the right lower quadrant, proximal to the terminal  ileum. The abscess does measure smaller on the current study, 3.4 cm in greatest diameter compared to 4.3 cm on the prior. Trace inflammation of the adjacent mesenteric. Colonic diverticula are  present at the sigmoid colon near the abscess Normal appendix. Relatively decompressed distal small bowel and colon. Colonic diverticula. Vascular/Lymphatic: Scattered calcifications of the abdominal aorta. Mesenteric vessels are patent. No aneurysm.  No periaortic fluid. Reproductive: Hysterectomy. Other: No abdominal wall hernia. Small gas foci of the abdominal wall likely secondary to injections. Musculoskeletal: No displaced fracture. Degenerative changes are worst at the L4-L5 and L5-S1 levels. Surgical changes of right hip arthroplasty. IMPRESSION: Persisting small bowel obstruction, with the transition point in the mid to distal ileum immediately at the site of inflammation/ abscess, most likely secondary to sigmoid diverticulitis/ micro perforation. Abscess in the right lower quadrant persists though does measure smaller than the comparison CT, previously 4.3 cm and now 3.4 cm. Atrophic pancreas parenchyma, often seen with longstanding diabetes. Aortic atherosclerosis. Cholelithiasis. Nonobstructive bilateral nephrolithiasis, similar to prior. Signed, Yvone Neu. Loreta Ave, DO [facility] Electronically Signed   By: Gilmer Mor D.O.   On: 04/26/2016 17:39   Dg Abd 2 Views  Result Date: 04/25/2016 CLINICAL DATA:  Follow-up small bowel obstruction EXAM: ABDOMEN - 2 VIEW COMPARISON:  04/24/2016 FINDINGS: Enteric tube terminates in the proximal duodenum. Dilated loops of small bowel in the mid abdomen, suggesting small bowel obstruction. No evidence of free air under the diaphragm on the upright view. Cholelithiasis. Right hip arthroplasty. IMPRESSION: Enteric tube terminates in the proximal duodenum. Dilated loops of small bowel in the mid abdomen, suggesting small bowel obstruction. No free air. Cholelithiasis. Electronically Signed   By: Charline Bills M.D.   On: 04/25/2016 12:49    Assessment & Plan:      Refer to beginning of this  note.   Chaise Mahabir M 04/27/2016  Patient ID: Monique [name], female   DOB: 09-10-1940, 75 y.o.   MRN: [id number]

## 2016-04-28 MED ORDER — HYDROMORPHONE HCL 1 MG/ML IJ SOLN
0.5000 mg | INTRAMUSCULAR | Status: DC | PRN
  Administered 2016-04-28 – 2016-04-29 (×4): 0.5 mg via INTRAVENOUS
  Filled 2016-04-28 (×4): qty 1

## 2016-04-28 MED ORDER — FAMOTIDINE 20 MG PO TABS
20.0000 mg | ORAL_TABLET | Freq: Two times a day (BID) | ORAL | Status: DC
  Administered 2016-04-28 (×2): 20 mg via ORAL
  Filled 2016-04-28 (×3): qty 1

## 2016-04-28 MED ORDER — PROMETHAZINE HCL 25 MG/ML IJ SOLN
12.5000 mg | Freq: Three times a day (TID) | INTRAMUSCULAR | Status: DC | PRN
  Administered 2016-04-28 – 2016-05-08 (×3): 12.5 mg via INTRAVENOUS
  Filled 2016-04-28 (×3): qty 1

## 2016-04-28 MED ORDER — HYDROCODONE-ACETAMINOPHEN 5-325 MG PO TABS
1.0000 | ORAL_TABLET | ORAL | Status: DC | PRN
  Administered 2016-04-28 – 2016-05-07 (×23): 2 via ORAL
  Filled 2016-04-28 (×23): qty 2

## 2016-04-28 NOTE — Progress Notes (Signed)
Pt with persistent nausea. Gets temporary relief from zofran. Requesting more med for nausea not too long after zofran administration. Dr. Ezzard StandingNewman made aware. New order given for phenergan. See MAR. Ofilia NeasVWilliams,rn.

## 2016-04-28 NOTE — Progress Notes (Signed)
General Surgery [facility]  Assessment & Plan: Diverticulitis with perforation and abscess Small bowel obstruction             Tolerated clear liquid diet yesterday, wants something more             Trial of full liquid diet today             Continue IV Zosyn  Begin po meds        Velora Hecklerodd [name]. Barkley Kratochvil, MD, [facility]       [facility]       Office: [phone]    Subjective: Patient in bed, no complaints.  Denies nausea or emesis.  Loose BM's.  Objective: Vital signs in last 24 hours: Temp:  [97.7 F (36.5 C)-98 F (36.7 C)] 98 F (36.7 C) (09/26 0520) Pulse Rate:  [84-94] 84 (09/26 0520) Resp:  [18] 18 (09/26 0520) BP: (131-163)/(83-94) 131/83 (09/26 0520) SpO2:  [97 %-100 %] 97 % (09/26 0520) Last BM Date: 04/27/16  Intake/Output from previous day: 09/25 0701 - 09/26 0700 In: 720 [P.O.:720] Out: 200 [Urine:200] Intake/Output this shift: No intake/output data recorded.  Physical Exam: HEENT - sclerae clear, mucous membranes moist Neck - soft Abdomen - soft, obese; active BS present; non-tender Ext - no edema, non-tender Neuro - alert & oriented, no focal deficits  Lab Results:  No results for input(s): WBC, HGB, HCT, PLT in the last 72 hours. BMET No results for input(s): NA, K, CL, CO2, GLUCOSE, BUN, CREATININE, CALCIUM in the last 72 hours. PT/INR No results for input(s): LABPROT, INR in the last 72 hours. Comprehensive Metabolic Panel:    Component Value Date/Time   NA 135 04/22/2016 0748   NA 135 04/21/2016 1735   K 4.6 04/22/2016 0748   K 4.3 04/21/2016 1735   CL 101 04/22/2016 0748   CL 97 (L) 04/21/2016 1735   CO2 26 04/22/2016 0748   CO2 26 04/21/2016 1735   BUN 26 (H) 04/22/2016 0748   BUN 31 (H) 04/21/2016 1735   CREATININE 0.68 04/22/2016 0748   CREATININE 0.81 04/21/2016 1735   GLUCOSE 139 (H) 04/22/2016 0748   GLUCOSE 155 (H) 04/21/2016 1735   CALCIUM 9.0 04/22/2016 0748   CALCIUM 9.4 04/21/2016 1735    AST 19 04/21/2016 1735   ALT 23 04/21/2016 1735   ALKPHOS 60 04/21/2016 1735   BILITOT 1.2 04/21/2016 1735   PROT 6.9 04/21/2016 1735   ALBUMIN 3.5 04/21/2016 1735    Studies/Results: Ct Abdomen Pelvis W Contrast  Result Date: 04/26/2016 CLINICAL DATA:  75 year old female with a history of abdominal pain since Tuesday. EXAM: CT ABDOMEN AND PELVIS WITH CONTRAST TECHNIQUE: Multidetector CT imaging of the abdomen and pelvis was performed using the standard protocol following bolus administration of intravenous contrast. CONTRAST:  30mL ISOVUE-300 IOPAMIDOL (ISOVUE-300) INJECTION 61%, 100mL ISOVUE-300 IOPAMIDOL (ISOVUE-300) INJECTION 61% COMPARISON:  CT 04/21/2016 FINDINGS: Lower chest: Lung bases clear. Unremarkable appearance of the heart. No pericardial fluid/ thickening. Hepatobiliary: Unremarkable liver. Multiple radiopaque gallstones within the gallbladder. No associated inflammatory changes. No ductal dilation. Pancreas: Attenuated pancreatic parenchyma with no associated inflammation. Spleen: Unremarkable spleen Adrenals/Urinary Tract: Unremarkable appearance of the bilateral adrenal glands. No right-sided hydronephrosis. There is either an nonobstructive stone in the inferior collecting system or early excretion of contrast. No left-sided hydronephrosis. Either early excretion of contrast or nonobstructive stone in the lower pole collecting system. Unremarkable course of the bilateral ureters. Stomach/Bowel: Gastric tube terminates post pyloric in  the first portion the duodenum. Air-fluid levels and dilated small bowel loops within the mid abdomen and lower abdomen. The transition point is exactly at the point of the peritoneal abscess of the right lower quadrant, proximal to the terminal ileum. The abscess does measure smaller on the current study, 3.4 cm in greatest diameter compared to 4.3 cm on the prior. Trace inflammation of the adjacent mesenteric. Colonic diverticula are present at the  sigmoid colon near the abscess Normal appendix. Relatively decompressed distal small bowel and colon. Colonic diverticula. Vascular/Lymphatic: Scattered calcifications of the abdominal aorta. Mesenteric vessels are patent. No aneurysm.  No periaortic fluid. Reproductive: Hysterectomy. Other: No abdominal wall hernia. Small gas foci of the abdominal wall likely secondary to injections. Musculoskeletal: No displaced fracture. Degenerative changes are worst at the L4-L5 and L5-S1 levels. Surgical changes of right hip arthroplasty. IMPRESSION: Persisting small bowel obstruction, with the transition point in the mid to distal ileum immediately at the site of inflammation/ abscess, most likely secondary to sigmoid diverticulitis/ micro perforation. Abscess in the right lower quadrant persists though does measure smaller than the comparison CT, previously 4.3 cm and now 3.4 cm. Atrophic pancreas parenchyma, often seen with longstanding diabetes. Aortic atherosclerosis. Cholelithiasis. Nonobstructive bilateral nephrolithiasis, similar to prior. Signed, Monique Neu. Loreta Ave, DO [facility] Electronically Signed   By: Gilmer Mor D.O.   On: 04/26/2016 17:39    Assessment & Plan:      Refer to beginning of this note.   Monique [name] 04/28/2016  Patient ID: Monique [name], female   DOB: 1940-08-20, 75 y.o.   MRN: [id number]

## 2016-04-29 ENCOUNTER — Inpatient Hospital Stay (HOSPITAL_COMMUNITY): Payer: Medicare Other

## 2016-04-29 LAB — BASIC METABOLIC PANEL
Anion gap: 10 (ref 5–15)
BUN: 11 mg/dL (ref 6–20)
CHLORIDE: 101 mmol/L (ref 101–111)
CO2: 27 mmol/L (ref 22–32)
CREATININE: 0.64 mg/dL (ref 0.44–1.00)
Calcium: 9.2 mg/dL (ref 8.9–10.3)
Glucose, Bld: 196 mg/dL — ABNORMAL HIGH (ref 65–99)
Potassium: 4 mmol/L (ref 3.5–5.1)
SODIUM: 138 mmol/L (ref 135–145)

## 2016-04-29 LAB — CBC WITH DIFFERENTIAL/PLATELET
BASOS PCT: 0 %
Basophils Absolute: 0 10*3/uL (ref 0.0–0.1)
EOS ABS: 0 10*3/uL (ref 0.0–0.7)
EOS PCT: 0 %
HCT: 39.2 % (ref 36.0–46.0)
HEMOGLOBIN: 13 g/dL (ref 12.0–15.0)
LYMPHS ABS: 1.1 10*3/uL (ref 0.7–4.0)
Lymphocytes Relative: 9 %
MCH: 31 pg (ref 26.0–34.0)
MCHC: 33.2 g/dL (ref 30.0–36.0)
MCV: 93.3 fL (ref 78.0–100.0)
MONOS PCT: 7 %
Monocytes Absolute: 0.8 10*3/uL (ref 0.1–1.0)
NEUTROS PCT: 84 %
Neutro Abs: 10.4 10*3/uL — ABNORMAL HIGH (ref 1.7–7.7)
PLATELETS: 276 10*3/uL (ref 150–400)
RBC: 4.2 MIL/uL (ref 3.87–5.11)
RDW: 13.8 % (ref 11.5–15.5)
WBC: 12.3 10*3/uL — AB (ref 4.0–10.5)

## 2016-04-29 MED ORDER — OXYCODONE-ACETAMINOPHEN 5-325 MG PO TABS: 1.0000 | ORAL_TABLET | ORAL | Status: DC | PRN

## 2016-04-29 MED ORDER — HYDROMORPHONE HCL 1 MG/ML IJ SOLN
0.5000 mg | INTRAMUSCULAR | Status: DC | PRN
  Administered 2016-04-29: 2 mg via INTRAVENOUS
  Administered 2016-04-29: 1 mg via INTRAVENOUS
  Administered 2016-04-29: 2 mg via INTRAVENOUS
  Administered 2016-04-29: 1 mg via INTRAVENOUS
  Administered 2016-04-29 – 2016-04-30 (×9): 2 mg via INTRAVENOUS
  Administered 2016-05-01: 1 mg via INTRAVENOUS
  Administered 2016-05-01 (×3): 2 mg via INTRAVENOUS
  Administered 2016-05-01 (×2): 1 mg via INTRAVENOUS
  Administered 2016-05-01 – 2016-05-02 (×2): 2 mg via INTRAVENOUS
  Administered 2016-05-02: 1 mg via INTRAVENOUS
  Administered 2016-05-02 (×6): 2 mg via INTRAVENOUS
  Administered 2016-05-03: 1 mg via INTRAVENOUS
  Filled 2016-04-29 (×8): qty 2
  Filled 2016-04-29 (×4): qty 1
  Filled 2016-04-29 (×2): qty 2
  Filled 2016-04-29: qty 1
  Filled 2016-04-29 (×14): qty 2

## 2016-04-29 NOTE — Progress Notes (Signed)
Initial Nutrition Assessment  DOCUMENTATION CODES:   Obesity unspecified  INTERVENTION:   If pt unable to have diet advanced, recommend initiation of nutrition support. See estimated needs below. RD and Intern will continue to monitor for diet advancement and tolerance. Will provide diet education once diet progresses.  NUTRITION DIAGNOSIS:   Inadequate oral intake related to nausea, vomiting as evidenced by per patient/family report, meal completion < 25%.  GOAL:   Patient will meet greater than or equal to 90% of their needs  MONITOR:   PO intake, Diet advancement, Labs, Weight trends, I & O's  REASON FOR ASSESSMENT:   LOS, Other (Comment) (Identified during rounds)  ASSESSMENT:   [age] y.o. female who had a busy weekend but began feeling bad Sunday night [place].  She has had a hip replacement and takes steroid injections [place] and she last received one 2 weeks ago.  She admits to constipation since she is on chronic pain meds.  She is followed by Dr. [place] Badger and his NP ordered a CT today which showed a perforation/abscess in the right lower quadrant.  Spoke with pt at bedside who reports poor appetite for past two days along with nausea and vomiting after transition from Clear Liquids to Full Liquids during hospital stay. Pt reports good appetite PTA (breakfast: crackers with peanut butter and cranberry juice; lunch: smoothie). Pt states she consumed 5-6 small meals per day PTA. Pt reports consuming a lot of fruit and food that was generally softer in texture.  The last time pt reports tolerating food was Friday PTA (04/17/16).  Pt reports some pain in esophagus upon eating PTA.  Pt denies any recent weight loss. Per chart, pt's weight hx is stable. Pt reports UBW as 175-180 #.  Pt requests diet education prior to discharge. Will follow up with pt once she is able to tolerate food.  Medications reviewed and include  Pepcid, PRN Phenergan, PRN Dulcolax, PRN Zofran. Dextrose 5% @ 75 mL/hr provides 306 kcal. Labs reviewed and include elevated glucose (196). NFPE: Exam performed. No fat depletion, no muscle depletion, and no edema noted.  Diet Order:  Diet full liquid Room service appropriate? Yes; Fluid consistency: Thin  Skin:  Reviewed, no issues  Last BM:  04/27/16  Height:   Ht Readings from Last 1 Encounters:  04/21/16 5\' 5"  (1.651 m)    Weight:   Wt Readings from Last 1 Encounters:  04/21/16 180 lb 5.4 oz (81.8 kg)    Ideal Body Weight:  57 kg  BMI:  Body mass index is 30.01 kg/m.  Estimated Nutritional Needs:   Kcal:  1800-2000  Protein:  95-105 grams  Fluid:  1.8-2.0 L/day  EDUCATION NEEDS:   No education needs identified at this time  Monique [name] Dietetic Intern Pager Number: [phone]

## 2016-04-29 NOTE — Progress Notes (Signed)
Film shows marked SB dilatation, stomach distension with fluid and air in the stomach.  I talked to her about it.  She does not want an NG.  She just ate a whole popsicle.  I told her any more nausea, vomiting or increased distension she will need the NG replaced.  She really needs it now, but I am giving her an opportunity to prove me wrong.  We discussed the risk of aspiration with ongoing nausea and vomiting.Marland Kitchen. Her husband had a similar issue so she is aware of this.  I have changed her back to ice chips and sips of clears from the floor. I am leaving her IV fluids at 75 ml/hr.  Will recheck labs and film in early AM.  She may need a repeat CT in the AM.

## 2016-04-29 NOTE — Progress Notes (Signed)
Patient's nausea and vomitting persisted through the afternoon.  #14 Fr NG tube inserted into R nare with immediate return of green gastric liquid at 1345.  Immediate return of 1200 cc of fluid--by end of day shift--2000cc had ben removed.  Patient with markedly less distention, pain and nausea.

## 2016-04-29 NOTE — Progress Notes (Signed)
  General Surgery [facility] [place]  04/29/2016  Assessment & Plan: Diverticulitis with perforation and abscess  Abscess smaller on last CT scan - not amenable to perc drainage  Continue IV Zosyn  Monitor WBC - increased to 12K this AM  Check CBC in AM 9/28 Small bowel obstruction  Developed nausea and emesis with full liquid diet yesterday  Will check AXR this AM  Increase IVF Narcotic dependence  Inadvertent stoppage of narcotics yesterday  Patient with chronic pain, followed in pain clinic  IV and PO narcotics re-started and available to patient  Discussed possible need for operative intervention if no sign of clinical resolution next 24-48 hours.        Velora Hecklerodd M. Lira Stephen, MD, [facility]       [facility]       Office: [phone]    Subjective: Nauseated, upset over stoppage of narcotics yesterday.  No flatus or BM this AM.  Objective: Vital signs in last 24 hours: Temp:  [97.6 F (36.4 C)-98.5 F (36.9 C)] 98.5 F (36.9 C) (09/27 0700) Pulse Rate:  [94-118] 101 (09/27 0700) Resp:  [16-20] 20 (09/27 0700) BP: (154-168)/(82-88) 161/88 (09/27 0700) SpO2:  [96 %-99 %] 96 % (09/27 0700) Last BM Date: 04/27/16  Intake/Output from previous day: 09/26 0701 - 09/27 0700 In: 1024.1 [P.O.:360; I.V.:664.1] Out: -  Intake/Output this shift: No intake/output data recorded.  Physical Exam: HEENT - sclerae clear, mucous membranes moist Neck - soft Abdomen - moderate distension, soft, mild diffuse tenderness; few BS present Ext - no edema, non-tender Neuro - alert & oriented, no focal deficits  Lab Results:   Recent Labs  04/29/16 0417  WBC 12.3*  HGB 13.0  HCT 39.2  PLT 276   BMET  Recent Labs  04/29/16 0417  NA 138  K 4.0  CL 101  CO2 27  GLUCOSE 196*  BUN 11  CREATININE 0.64  CALCIUM 9.2   PT/INR No results for input(s): LABPROT, INR in the last 72 hours. Comprehensive Metabolic Panel:    Component Value Date/Time   NA 138 04/29/2016 0417   NA 135 04/22/2016 0748   K 4.0 04/29/2016 0417   K 4.6 04/22/2016 0748   CL 101 04/29/2016 0417   CL 101 04/22/2016 0748   CO2 27 04/29/2016 0417   CO2 26 04/22/2016 0748   BUN 11 04/29/2016 0417   BUN 26 (H) 04/22/2016 0748   CREATININE 0.64 04/29/2016 0417   CREATININE 0.68 04/22/2016 0748   GLUCOSE 196 (H) 04/29/2016 0417   GLUCOSE 139 (H) 04/22/2016 0748   CALCIUM 9.2 04/29/2016 0417   CALCIUM 9.0 04/22/2016 0748   AST 19 04/21/2016 1735   ALT 23 04/21/2016 1735   ALKPHOS 60 04/21/2016 1735   BILITOT 1.2 04/21/2016 1735   PROT 6.9 04/21/2016 1735   ALBUMIN 3.5 04/21/2016 1735    Studies/Results: No results found.    Gari Hartsell M 04/29/2016  Patient ID: Monique [name], female   DOB: 05/07/1941, 75 y.o.   MRN: [id number]

## 2016-04-30 ENCOUNTER — Inpatient Hospital Stay (HOSPITAL_COMMUNITY): Payer: Medicare Other

## 2016-04-30 LAB — BASIC METABOLIC PANEL
Anion gap: 8 (ref 5–15)
BUN: 17 mg/dL (ref 6–20)
CALCIUM: 8.7 mg/dL — AB (ref 8.9–10.3)
CO2: 35 mmol/L — AB (ref 22–32)
CREATININE: 0.77 mg/dL (ref 0.44–1.00)
Chloride: 97 mmol/L — ABNORMAL LOW (ref 101–111)
GFR calc non Af Amer: >60 mL/min (ref >60)
GLUCOSE: 127 mg/dL — AB (ref 65–99)
Potassium: 3.8 mmol/L (ref 3.5–5.1)
Sodium: 140 mmol/L (ref 135–145)

## 2016-04-30 LAB — CBC
HEMATOCRIT: 37.1 % (ref 36.0–46.0)
Hemoglobin: 12.4 g/dL (ref 12.0–15.0)
MCH: 30.5 pg (ref 26.0–34.0)
MCHC: 33.4 g/dL (ref 30.0–36.0)
MCV: 91.4 fL (ref 78.0–100.0)
Platelets: 252 10*3/uL (ref 150–400)
RBC: 4.06 MIL/uL (ref 3.87–5.11)
RDW: 13.3 % (ref 11.5–15.5)
WBC: 8.4 10*3/uL (ref 4.0–10.5)

## 2016-04-30 MED ORDER — FAMOTIDINE IN NACL 20-0.9 MG/50ML-% IV SOLN
20.0000 mg | Freq: Two times a day (BID) | INTRAVENOUS | Status: DC
  Administered 2016-04-30 – 2016-05-03 (×8): 20 mg via INTRAVENOUS
  Filled 2016-04-30 (×9): qty 50

## 2016-04-30 NOTE — Plan of Care (Signed)
Problem: Activity: Goal: Risk for activity intolerance will decrease Outcome: Progressing Patient out of bed in the recliner chair. Encouraged to ambulate.

## 2016-04-30 NOTE — Progress Notes (Signed)
General Surgery [facility]  04/30/2016  Assessment & Plan: Diverticulitis with perforation and abscess             Continue IV Zosyn             Monitor WBC - decreased to 8.8K this AM             Check CBC in AM 9/29 Small bowel obstruction  NG tube in place and functioning well  AXR this AM improved with gas in colon  Encouraged ambulation in halls Narcotic dependence             IV Dilaudid with good pain control and no withdrawal symptoms  Discussed clinical course with patient and daughter Monique [name] this AM.        Velora Heckler, MD, [facility]       Office: [phone]    Subjective: Patient up at bedside having hair washed and fixed.  No complaints.  NG in place.  Taking ice chips and sips of water.  Objective: Vital signs in last 24 hours: Temp:  [94 F (34.4 C)-98.2 F (36.8 C)] 98.2 F (36.8 C) (09/28 0659) Pulse Rate:  [95-106] 106 (09/28 0659) Resp:  [20] 20 (09/28 0659) BP: (140-165)/(75-84) 140/81 (09/28 0659) SpO2:  [96 %-99 %] 99 % (09/28 0659) Last BM Date: 04/27/16  Intake/Output from previous day: 09/27 0701 - 09/28 0700 In: 200 [P.O.:200] Out: 3450 [Emesis/NG output:2100] Intake/Output this shift: No intake/output data recorded.  Physical Exam: HEENT - sclerae clear, mucous membranes moist Neck - soft Chest - clear bilaterally Cor - RRR Abdomen - softer, less distended; non-tender; few BS present Ext - no edema, non-tender Neuro - alert & oriented, no focal deficits  Lab Results:   Recent Labs  04/29/16 0417 04/30/16 0422  WBC 12.3* 8.4  HGB 13.0 12.4  HCT 39.2 37.1  PLT 276 252   BMET  Recent Labs  04/29/16 0417 04/30/16 0422  NA 138 140  K 4.0 3.8  CL 101 97*  CO2 27 35*  GLUCOSE 196* 127*  BUN 11 17  CREATININE 0.64 0.77  CALCIUM 9.2 8.7*   PT/INR No results for input(s): LABPROT, INR in the last 72 hours. Comprehensive Metabolic Panel:    Component Value  Date/Time   NA 140 04/30/2016 0422   NA 138 04/29/2016 0417   K 3.8 04/30/2016 0422   K 4.0 04/29/2016 0417   CL 97 (L) 04/30/2016 0422   CL 101 04/29/2016 0417   CO2 35 (H) 04/30/2016 0422   CO2 27 04/29/2016 0417   BUN 17 04/30/2016 0422   BUN 11 04/29/2016 0417   CREATININE 0.77 04/30/2016 0422   CREATININE 0.64 04/29/2016 0417   GLUCOSE 127 (H) 04/30/2016 0422   GLUCOSE 196 (H) 04/29/2016 0417   CALCIUM 8.7 (L) 04/30/2016 0422   CALCIUM 9.2 04/29/2016 0417   AST 19 04/21/2016 1735   ALT 23 04/21/2016 1735   ALKPHOS 60 04/21/2016 1735   BILITOT 1.2 04/21/2016 1735   PROT 6.9 04/21/2016 1735   ALBUMIN 3.5 04/21/2016 1735    Studies/Results: Dg Abd 1 View  Result Date: 04/30/2016 CLINICAL DATA:  Follow-up small bowel obstruction EXAM: ABDOMEN - 1 VIEW COMPARISON:  Yesterday FINDINGS: Nasogastric tube is in the stomach which has been decompressed. Dilated small bowel loops are normalizing. More gas is now seen within colon. Bilateral nephrolithiasis and cholelithiasis. No concerning gas collection or  mass effect. IMPRESSION: 1. Normalizing bowel gas pattern. 2. Nasogastric tube in good position. 3. Bilateral nephrolithiasis and cholelithiasis. Electronically Signed   By: Marnee SpringJonathon  Watts M.D.   On: 04/30/2016 08:58   Dg Abd 2 Views  Result Date: 04/29/2016 CLINICAL DATA:  Vomiting for the past 24 hours, decreased appetite and inability to keep food down for the past week. Recent small bowel obstruction EXAM: ABDOMEN - 2 VIEW COMPARISON:  CT scan of the abdomen pelvis of April 26, 2016 FINDINGS: The stomach is moderately distended with fluid and air. There are loops of moderately distended gas-filled small bowel predominantly to the right of midline. There is a relative paucity of colonic gas. There is some stool in the rectum. There are bilateral kidney stones. There are multiple tiny gallstones. 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 Density at the left lung base is compatible  with atelectasis. IMPRESSION: Mid to distal small bowel obstruction. Significant distention of the stomach with fluid and air. No evidence of perforation. Known gallstones and kidney stones. Left basilar atelectasis. Electronically Signed   By: David  SwazilandJordan M.D.   On: 04/29/2016 09:20      Monique [name] M 04/30/2016  Patient ID: Monique [name], female   DOB: 10/03/1940, [age] y.o.   MRN: [id number]

## 2016-05-01 LAB — CBC
HEMATOCRIT: 38.1 % (ref 36.0–46.0)
HEMOGLOBIN: 12.6 g/dL (ref 12.0–15.0)
MCH: 31 pg (ref 26.0–34.0)
MCHC: 33.1 g/dL (ref 30.0–36.0)
MCV: 93.6 fL (ref 78.0–100.0)
Platelets: 255 10*3/uL (ref 150–400)
RBC: 4.07 MIL/uL (ref 3.87–5.11)
RDW: 13.4 % (ref 11.5–15.5)
WBC: 8.3 10*3/uL (ref 4.0–10.5)

## 2016-05-01 NOTE — Progress Notes (Signed)
Patient ambulated this am,finished  one lap in the hallway.

## 2016-05-01 NOTE — Progress Notes (Signed)
General Surgery [facility]- [facility]  05/01/2016  Assessment & Plan: Diverticulitis with perforation and abscess Continue IV Zosyn WBC - decreased to 8.3K this AM Small bowel obstruction             NG tube in place - will give trial of clamping             Encouraged ambulation in halls Narcotic dependence IV Dilaudid with good pain control and no withdrawal symptoms         Velora Hecklerodd M. Anniyah Mood, MD, [facility]       [facility]       Office: [phone]    Subjective: Patient up in chair, working on computer.  No complaints.  No nausea.  Passing flatus, no BM.  Taking ice chips.  Objective: Vital signs in last 24 hours: Temp:  [98 F (36.7 C)-98.2 F (36.8 C)] 98.2 F (36.8 C) (09/29 0443) Pulse Rate:  [84-108] 100 (09/29 0443) Resp:  [16-20] 16 (09/29 0443) BP: (156-167)/(67-90) 156/67 (09/29 0443) SpO2:  [96 %-100 %] 96 % (09/29 0443) Last BM Date: 04/27/16  Intake/Output from previous day: 09/28 0701 - 09/29 0700 In: 800 [P.O.:150; I.V.:600; IV Piggyback:50] Out: 400 [Emesis/NG output:400] Intake/Output this shift: Total I/O In: -  Out: 100 [Urine:100]  Physical Exam: HEENT - sclerae clear, mucous membranes moist Neck - soft Chest - clear bilaterally Cor - RRR Abdomen - soft without distension; NG with bilious in cannister; non-tender Ext - no edema, non-tender Neuro - alert & oriented, no focal deficits  Lab Results:   Recent Labs  04/30/16 0422 05/01/16 0813  WBC 8.4 8.3  HGB 12.4 12.6  HCT 37.1 38.1  PLT 252 255   BMET  Recent Labs  04/29/16 0417 04/30/16 0422  NA 138 140  K 4.0 3.8  CL 101 97*  CO2 27 35*  GLUCOSE 196* 127*  BUN 11 17  CREATININE 0.64 0.77  CALCIUM 9.2 8.7*   PT/INR No results for input(s): LABPROT, INR in the last 72 hours. Comprehensive Metabolic Panel:    Component Value Date/Time   NA 140 04/30/2016 0422   NA 138 04/29/2016 0417   K 3.8  04/30/2016 0422   K 4.0 04/29/2016 0417   CL 97 (L) 04/30/2016 0422   CL 101 04/29/2016 0417   CO2 35 (H) 04/30/2016 0422   CO2 27 04/29/2016 0417   BUN 17 04/30/2016 0422   BUN 11 04/29/2016 0417   CREATININE 0.77 04/30/2016 0422   CREATININE 0.64 04/29/2016 0417   GLUCOSE 127 (H) 04/30/2016 0422   GLUCOSE 196 (H) 04/29/2016 0417   CALCIUM 8.7 (L) 04/30/2016 0422   CALCIUM 9.2 04/29/2016 0417   AST 19 04/21/2016 1735   ALT 23 04/21/2016 1735   ALKPHOS 60 04/21/2016 1735   BILITOT 1.2 04/21/2016 1735   PROT 6.9 04/21/2016 1735   ALBUMIN 3.5 04/21/2016 1735    Studies/Results: Dg Abd 1 View  Result Date: 04/30/2016 CLINICAL DATA:  Follow-up small bowel obstruction EXAM: ABDOMEN - 1 VIEW COMPARISON:  Yesterday FINDINGS: Nasogastric tube is in the stomach which has been decompressed. Dilated small bowel loops are normalizing. More gas is now seen within colon. Bilateral nephrolithiasis and cholelithiasis. No concerning gas collection or mass effect. IMPRESSION: 1. Normalizing bowel gas pattern. 2. Nasogastric tube in good position. 3. Bilateral nephrolithiasis and cholelithiasis. Electronically Signed   By: Marnee SpringJonathon  Watts M.D.   On: 04/30/2016 08:58      Leilene Diprima Judie PetitM 05/01/2016  Patient ID: [name], female   DOB: 1940/10/03, 75 y.o.   MRN: [id number]

## 2016-05-01 NOTE — Progress Notes (Signed)
NGT clamped per MD order.

## 2016-05-02 NOTE — Progress Notes (Signed)
  General Surgery [facility]  05/02/2016  Assessment & Plan: Diverticulitis with perforation and abscess Continue IV Zosyn Small bowel obstruction Tolerated clamping for 24 hours - will discontinue  Begin clear liquid diet Encouraged ambulation in halls Narcotic dependence IV Dilaudid with good pain control and no withdrawal symptoms        Velora Hecklerodd M. Nakina Spatz, MD, [facility]       [facility]       Office: [phone]    Subjective: Patient up in chair, on computer.  NG clamped, no nausea, no pain.  Objective: Vital signs in last 24 hours: Temp:  [98 F (36.7 C)-98.2 F (36.8 C)] 98.1 F (36.7 C) (09/30 0541) Pulse Rate:  [103-105] 105 (09/30 0541) Resp:  [16] 16 (09/30 0541) BP: (147-168)/(91-99) 168/99 (09/30 0541) SpO2:  [99 %] 99 % (09/30 0541) Last BM Date: 04/27/16  Intake/Output from previous day: 09/29 0701 - 09/30 0700 In: 600 [I.V.:600] Out: 830 [Urine:700; Emesis/NG output:130] Intake/Output this shift: Total I/O In: -  Out: 200 [Urine:200]  Physical Exam: HEENT - sclerae clear, mucous membranes moist Neck - soft Chest - clear bilaterally Cor - RRR Abdomen - soft, quiet, non-tender; NG aspirated with only small bilious output Ext - no edema, non-tender Neuro - alert & oriented, no focal deficits  Lab Results:   Recent Labs  04/30/16 0422 05/01/16 0813  WBC 8.4 8.3  HGB 12.4 12.6  HCT 37.1 38.1  PLT 252 255   BMET  Recent Labs  04/30/16 0422  NA 140  K 3.8  CL 97*  CO2 35*  GLUCOSE 127*  BUN 17  CREATININE 0.77  CALCIUM 8.7*   PT/INR No results for input(s): LABPROT, INR in the last 72 hours. Comprehensive Metabolic Panel:    Component Value Date/Time   NA 140 04/30/2016 0422   NA 138 04/29/2016 0417   K 3.8 04/30/2016 0422   K 4.0 04/29/2016 0417   CL 97 (L) 04/30/2016 0422   CL 101 04/29/2016 0417   CO2 35 (H) 04/30/2016 0422   CO2 27  04/29/2016 0417   BUN 17 04/30/2016 0422   BUN 11 04/29/2016 0417   CREATININE 0.77 04/30/2016 0422   CREATININE 0.64 04/29/2016 0417   GLUCOSE 127 (H) 04/30/2016 0422   GLUCOSE 196 (H) 04/29/2016 0417   CALCIUM 8.7 (L) 04/30/2016 0422   CALCIUM 9.2 04/29/2016 0417   AST 19 04/21/2016 1735   ALT 23 04/21/2016 1735   ALKPHOS 60 04/21/2016 1735   BILITOT 1.2 04/21/2016 1735   PROT 6.9 04/21/2016 1735   ALBUMIN 3.5 04/21/2016 1735    Studies/Results: No results found.    Brilee Port M 05/02/2016  Patient ID: Monique KluverAnn T Warehime, female   DOB: 02/13/1941, 75 y.o.   MRN: [id number]

## 2016-05-03 NOTE — Progress Notes (Signed)
  General Surgery [facility]  05/03/2016  Assessment & Plan: Diverticulitis with perforation and abscess Continue IV Zosyn  Check CBC with diff in AM 10/2 Small bowel obstruction  Tolerating clear liquids - advance to full liquids today Encouraged ambulation in halls Narcotic dependence IV Dilaudid with good pain control and no withdrawal symptoms        Monique Hecklerodd M. Jayme Mednick, MD, [facility] L[facility]       [facility]       Office: [phone]    Subjective: Patient in bed, slept well.  Denies nausea or emesis.  Denies abd pain.  No BM, but passing flatus.  Objective: Vital signs in last 24 hours: Temp:  [97.8 F (36.6 C)-98.5 F (36.9 C)] 97.8 F (36.6 C) (09/30 2150) Pulse Rate:  [97-105] 104 (10/01 0516) Resp:  [16] 16 (10/01 0516) BP: (174-185)/(96-114) 174/103 (10/01 0516) SpO2:  [100 %] 100 % (10/01 0516) Last BM Date: 04/27/16  Intake/Output from previous day: 09/30 0701 - 10/01 0700 In: 567.1 [I.V.:468.3; IV Piggyback:98.8] Out: 1700 [Urine:1700] Intake/Output this shift: No intake/output data recorded.  Physical Exam: HEENT - sclerae clear, mucous membranes moist Neck - soft Abdomen - soft without distension; BS present; non-tender Ext - no edema, non-tender Neuro - alert & oriented, no focal deficits  Lab Results:   Recent Labs  05/01/16 0813  WBC 8.3  HGB 12.6  HCT 38.1  PLT 255   BMET No results for input(s): NA, K, CL, CO2, GLUCOSE, BUN, CREATININE, CALCIUM in the last 72 hours. PT/INR No results for input(s): LABPROT, INR in the last 72 hours. Comprehensive Metabolic Panel:    Component Value Date/Time   NA 140 04/30/2016 0422   NA 138 04/29/2016 0417   K 3.8 04/30/2016 0422   K 4.0 04/29/2016 0417   CL 97 (L) 04/30/2016 0422   CL 101 04/29/2016 0417   CO2 35 (H) 04/30/2016 0422   CO2 27 04/29/2016 0417   BUN 17 04/30/2016 0422   BUN 11 04/29/2016 0417   CREATININE 0.77  04/30/2016 0422   CREATININE 0.64 04/29/2016 0417   GLUCOSE 127 (H) 04/30/2016 0422   GLUCOSE 196 (H) 04/29/2016 0417   CALCIUM 8.7 (L) 04/30/2016 0422   CALCIUM 9.2 04/29/2016 0417   AST 19 04/21/2016 1735   ALT 23 04/21/2016 1735   ALKPHOS 60 04/21/2016 1735   BILITOT 1.2 04/21/2016 1735   PROT 6.9 04/21/2016 1735   ALBUMIN 3.5 04/21/2016 1735    Studies/Results: No results found.    Sharalyn Lomba M 05/03/2016  Patient ID: Monique [name], female   DOB: 07/19/1941, 75 y.o.   MRN: [id number]

## 2016-05-04 LAB — CBC WITH DIFFERENTIAL/PLATELET
Basophils Absolute: 0 10*3/uL (ref 0.0–0.1)
Basophils Relative: 0 %
Eosinophils Absolute: 0.1 10*3/uL (ref 0.0–0.7)
Eosinophils Relative: 1 %
HEMATOCRIT: 36.5 % (ref 36.0–46.0)
Hemoglobin: 12 g/dL (ref 12.0–15.0)
LYMPHS PCT: 17 %
Lymphs Abs: 1.2 10*3/uL (ref 0.7–4.0)
MCH: 30.8 pg (ref 26.0–34.0)
MCHC: 32.9 g/dL (ref 30.0–36.0)
MCV: 93.6 fL (ref 78.0–100.0)
MONO ABS: 0.5 10*3/uL (ref 0.1–1.0)
MONOS PCT: 7 %
NEUTROS ABS: 5.5 10*3/uL (ref 1.7–7.7)
Neutrophils Relative %: 75 %
Platelets: 202 10*3/uL (ref 150–400)
RBC: 3.9 MIL/uL (ref 3.87–5.11)
RDW: 13.7 % (ref 11.5–15.5)
WBC: 7.3 10*3/uL (ref 4.0–10.5)

## 2016-05-04 MED ORDER — PANTOPRAZOLE SODIUM 40 MG PO TBEC
40.0000 mg | DELAYED_RELEASE_TABLET | Freq: Every day | ORAL | Status: DC
  Administered 2016-05-04 – 2016-05-06 (×3): 40 mg via ORAL
  Filled 2016-05-04 (×3): qty 1

## 2016-05-04 MED ORDER — BOOST / RESOURCE BREEZE PO LIQD
1.0000 | Freq: Three times a day (TID) | ORAL | Status: DC
  Administered 2016-05-04 – 2016-05-11 (×4): 1 via ORAL

## 2016-05-04 MED ORDER — FAMOTIDINE 20 MG PO TABS
20.0000 mg | ORAL_TABLET | Freq: Two times a day (BID) | ORAL | Status: DC
  Administered 2016-05-04 – 2016-05-15 (×23): 20 mg via ORAL
  Filled 2016-05-04 (×23): qty 1

## 2016-05-04 MED ORDER — AMOXICILLIN-POT CLAVULANATE 875-125 MG PO TABS
1.0000 | ORAL_TABLET | Freq: Two times a day (BID) | ORAL | Status: DC
  Administered 2016-05-04 – 2016-05-06 (×5): 1 via ORAL
  Filled 2016-05-04 (×5): qty 1

## 2016-05-04 NOTE — Progress Notes (Signed)
Nutrition Follow-up  DOCUMENTATION CODES:   Obesity unspecified  INTERVENTION:   -Provide Boost Breeze po TID, each supplement provides 250 kcal and 9 grams of protein -Encourage small, frequent meals -RD to continue to monitor for further diet advancement and educational needs  NUTRITION DIAGNOSIS:   Inadequate oral intake related to nausea, vomiting as evidenced by per patient/family report, meal completion < 25%.  Ongoing.  GOAL:   Patient will meet greater than or equal to 90% of their needs  Progressing.  MONITOR:   PO intake, Supplement acceptance, Diet advancement, Labs, Weight trends, I & O's  ASSESSMENT:   75 y.o. female who had a busy weekend but began feeling bad Sunday nig[place]ht [place]  She has had a hip replacement and takes steroid injections per [place]ris[place] and she last received one 2 weeks ago.  She admits to constipation since she is on chronic pain meds.  She is followed by Dr. [place]had Badger and his NP ordered a CT today which showed a perforation/abscess in the right lower quadrant.  Patient in room with no family present in room. Pt states she has been able to tolerate clears/full in the past 24 hours until this morning when she developed some nausea. The only thing she has consumed this morning was grits and vanilla pudding. Pt has been given Zofran and medication for heartburn. She wants to try a lunch tray and is willing to try Boost Breeze supplements in the afternoon if she tolerated her lunch tray. RD to order. Pt continues to be interested in diet education prior to discharge.  Labs reviewed. Medications: Zofran ODT every 6 hours PRN  Diet Order:  Diet full liquid Room service appropriate? Yes; Fluid consistency: Thin  Skin:  Reviewed, no issues  Last BM:  10/1  Height:   Ht Readings from Last 1 Encounters:  04/21/16 5\' 5"  (1.651 m)    Weight:   Wt Readings from Last 1 Encounters:  04/21/16 180 lb 5.4  oz (81.8 kg)    Ideal Body Weight:  57 kg  BMI:  Body mass index is 30.01 kg/m.  Estimated Nutritional Needs:   Kcal:  1800-2000  Protein:  95-105 grams  Fluid:  1.8-2.0 L/day  EDUCATION NEEDS:   No education needs identified at this time  Tilda FrancoLindsey Kanai Hilger, MS, RD, LDN Pager: [phone] After Hours Pager: [phone]

## 2016-05-04 NOTE — Progress Notes (Signed)
  General Surgery [facility]  05/04/2016  Assessment & Plan: Diverticulitis with perforation and abscess switch to PO abx, discussed with pharmacist re: PCN allergery.  He feels ok to start Augmentin  WBC normalized Small bowel obstruction  Tolerating full liquids but still having some nausea: will start PPI today, suspect nausea may be due to GERD  Cont fulls until nausea improves Encouraged ambulation in halls Narcotic dependence IV Dilaudid with good pain control and no withdrawal symptoms    Vanita PandaAlicia C Pete Merten, MD  Colorectal and General Surgery [facility]    Subjective: Patient in bed, slept well.  Having nausea, no emesis.  Denies abd pain.  Having BM's  Objective: Vital signs in last 24 hours: Temp:  [97.5 F (36.4 C)-98.4 F (36.9 C)] 97.9 F (36.6 C) (10/02 0555) Pulse Rate:  [80-99] 80 (10/02 0555) Resp:  [16] 16 (10/02 0555) BP: (149-166)/(90-95) 149/95 (10/02 0555) SpO2:  [98 %-100 %] 100 % (10/02 0555) Last BM Date: 05/03/16  Intake/Output from previous day: 10/01 0701 - 10/02 0700 In: 4898.6 [P.O.:2055; I.V.:2494.3; IV Piggyback:349.3] Out: 4900 [Urine:4900] Intake/Output this shift: Total I/O In: 200 [P.O.:200] Out: 100 [Urine:100]  Physical Exam: HEENT - sclerae clear, mucous membranes moist Neck - soft Abdomen - soft without distension; BS present; non-tender Ext - no edema, non-tender Neuro - alert & oriented, no focal deficits  Lab Results:   Recent Labs  05/04/16 0714  WBC 7.3  HGB 12.0  HCT 36.5  PLT 202   BMET No results for input(s): NA, K, CL, CO2, GLUCOSE, BUN, CREATININE, CALCIUM in the last 72 hours. PT/INR No results for input(s): LABPROT, INR in the last 72 hours. Comprehensive Metabolic Panel:    Component Value Date/Time   NA 140 04/30/2016 0422   NA 138 04/29/2016 0417   K 3.8 04/30/2016 0422   K 4.0 04/29/2016 0417   CL 97 (L) 04/30/2016 0422    CL 101 04/29/2016 0417   CO2 35 (H) 04/30/2016 0422   CO2 27 04/29/2016 0417   BUN 17 04/30/2016 0422   BUN 11 04/29/2016 0417   CREATININE 0.77 04/30/2016 0422   CREATININE 0.64 04/29/2016 0417   GLUCOSE 127 (H) 04/30/2016 0422   GLUCOSE 196 (H) 04/29/2016 0417   CALCIUM 8.7 (L) 04/30/2016 0422   CALCIUM 9.2 04/29/2016 0417   AST 19 04/21/2016 1735   ALT 23 04/21/2016 1735   ALKPHOS 60 04/21/2016 1735   BILITOT 1.2 04/21/2016 1735   PROT 6.9 04/21/2016 1735   ALBUMIN 3.5 04/21/2016 1735    Studies/Results: No results found.    Romie LeveeHOMAS, [name]. 05/04/2016  Patient ID: Monique [name], female   DOB: 05/22/1941, 75 y.o.   MRN: [id number]

## 2016-05-04 NOTE — Progress Notes (Signed)
Key Points: Use following P&T approved IV to PO  Description contains the criteria that are approved Note: Policy Excludes:  Esophagectomy patientsPHARMACIST - PHYSICIAN COMMUNICATION DR:   Gerrit FriendsGerkin CONCERNING: IV to Oral Route Change Policy  RECOMMENDATION: This patient is receiving Pepcid by the intravenous route.  Based on criteria approved by the Pharmacy and Therapeutics Committee, the intravenous medication(s) is/are being converted to the equivalent oral dose form(s).   DESCRIPTION: These criteria include:  The patient is eating (either orally or via tube) and/or has been taking other orally administered medications for a least 24 hours (no other oral meds necessary)  No IV access  The patient has no evidence of active gastrointestinal bleeding or impaired GI absorption (gastrectomy, short bowel, patient on TNA or NPO).  If you have questions about this conversion, please contact the Pharmacy Department  []   [phone]( [phone] )  [facility] []   [phone]( [phone] )  [facility] []   [phone]( [phone] )  [facility] []   [phone]( [phone] )  [facility] []   [phone]( [phone] )  [facility]   Otho BellowsGreen, Alannis Hsia L, [facility] 05/04/2016 9:35 AM

## 2016-05-05 ENCOUNTER — Inpatient Hospital Stay (HOSPITAL_COMMUNITY): Payer: Medicare Other

## 2016-05-05 NOTE — Progress Notes (Signed)
[facility] Progress Note     Subjective: Persistent nausea. Emesis this afternoon. + BMs that are soft and brown. +flatus. Endorses epigastric pain that has been present since admission, along with some crampy lower abdominal pain. Patient is ambulating at least twice daily.  Objective: Vital signs in last 24 hours: Temp:  [97.6 F (36.4 C)-98 F (36.7 C)] 97.6 F (36.4 C) (10/03 0537) Pulse Rate:  [93-96] 96 (10/03 0537) Resp:  [16] 16 (10/03 0537) BP: (133-168)/(77-80) 168/77 (10/03 0537) SpO2:  [98 %] 98 % (10/03 0537) Last BM Date: 05/04/16  Intake/Output from previous day: 10/02 0701 - 10/03 0700 In: 520 [P.O.:520] Out: 200 [Urine:200] Intake/Output this shift: Total I/O In: 250 [P.O.:250] Out: -   PE: Gen:  Alert, NAD, cooperative Card:  RRR, no M/G/R heard Pulm:  CTA, no W/R/R Abd: Soft, NT, mild distention, +BS Ext:  No erythema, edema, or tenderness Neuro: A&Ox3  Lab Results:   Recent Labs  05/04/16 0714  WBC 7.3  HGB 12.0  HCT 36.5  PLT 202   CMP     Component Value Date/Time   NA 140 04/30/2016 0422   K 3.8 04/30/2016 0422   CL 97 (L) 04/30/2016 0422   CO2 35 (H) 04/30/2016 0422   GLUCOSE 127 (H) 04/30/2016 0422   BUN 17 04/30/2016 0422   CREATININE 0.77 04/30/2016 0422   CALCIUM 8.7 (L) 04/30/2016 0422   PROT 6.9 04/21/2016 1735   ALBUMIN 3.5 04/21/2016 1735   AST 19 04/21/2016 1735   ALT 23 04/21/2016 1735   ALKPHOS 60 04/21/2016 1735   BILITOT 1.2 04/21/2016 1735   GFRNONAA >60 04/30/2016 0422   GFRAA >60 04/30/2016 0422   Lipase     Component Value Date/Time   LIPASE 19 04/21/2016 1735   Anti-infectives: Anti-infectives    Start     Dose/Rate Route Frequency Ordered Stop   05/04/16 1100  amoxicillin-clavulanate (AUGMENTIN) 875-125 MG per tablet 1 tablet     1 tablet Oral Every 12 hours 05/04/16 1015     04/21/16 2015  piperacillin-tazobactam (ZOSYN) IVPB 3.375 g  Status:  Discontinued     3.375 g 12.5 mL/hr  over 240 Minutes Intravenous Every 8 hours 04/21/16 2014 05/04/16 1015   04/21/16 1700  ciprofloxacin (CIPRO) IVPB 400 mg     400 mg 200 mL/hr over 60 Minutes Intravenous  Once 04/21/16 1652 04/21/16 1952   04/21/16 1700  metroNIDAZOLE (FLAGYL) IVPB 500 mg     500 mg 100 mL/hr over 60 Minutes Intravenous  Once 04/21/16 1652 04/21/16 1834   04/21/16 1645  clindamycin (CLEOCIN) IVPB 300 mg  Status:  Discontinued     300 mg 100 mL/hr over 30 Minutes Intravenous  Once 04/21/16 1643 04/21/16 1651   04/21/16 1645  clindamycin (CLEOCIN) IVPB 600 mg  Status:  Discontinued     600 mg 100 mL/hr over 30 Minutes Intravenous  Once 04/21/16 1644 04/21/16 1651     Assessment/Plan Diverticulitis with perforation and abscess - WBC normalized, now on PO Augmentin  SBO - persistent nausea, suspect exacerbated by GERD, started PPI 05/04/16 - full liquid diet - +flatus and bowel movements - Will check abdominal film, consider starting Reglan  Narcotic Dependence  - NORCO 10 mg, no withdrawal sxs   FEN: full liquids ID: Zosyn 9/20-10/1, Augmentin 10/2 >> DVT Proph: Lovenox, SCD's   LOS: 14 days    Adam PhenixElizabeth S Mikalia Fessel , [facility] 05/05/2016, 2:37 PM Pager: [phone] Consults: [phone]  Mon-Fri 7:00 am-4:30 pm Sat-Sun 7:00 am-11:30 am

## 2016-05-06 ENCOUNTER — Inpatient Hospital Stay (HOSPITAL_COMMUNITY): Payer: Medicare Other

## 2016-05-06 LAB — COMPREHENSIVE METABOLIC PANEL
ALK PHOS: 68 U/L (ref 38–126)
ALT: 29 U/L (ref 14–54)
AST: 18 U/L (ref 15–41)
Albumin: 3.5 g/dL (ref 3.5–5.0)
Anion gap: 7 (ref 5–15)
BILIRUBIN TOTAL: 0.8 mg/dL (ref 0.3–1.2)
BUN: 21 mg/dL — AB (ref 6–20)
CALCIUM: 8.9 mg/dL (ref 8.9–10.3)
CO2: 27 mmol/L (ref 22–32)
CREATININE: 0.79 mg/dL (ref 0.44–1.00)
Chloride: 105 mmol/L (ref 101–111)
GFR calc Af Amer: >60 mL/min (ref >60)
Glucose, Bld: 124 mg/dL — ABNORMAL HIGH (ref 65–99)
POTASSIUM: 3.9 mmol/L (ref 3.5–5.1)
Sodium: 139 mmol/L (ref 135–145)
TOTAL PROTEIN: 6.3 g/dL — AB (ref 6.5–8.1)

## 2016-05-06 LAB — PREALBUMIN: Prealbumin: 18.8 mg/dL (ref 18–38)

## 2016-05-06 MED ORDER — DEXTROSE 5 % IV SOLN
2.0000 g | INTRAVENOUS | Status: AC
  Administered 2016-05-07: 2 g via INTRAVENOUS
  Filled 2016-05-06: qty 2

## 2016-05-06 MED ORDER — POLYETHYLENE GLYCOL 3350 17 G PO PACK
60.0000 g | PACK | Freq: Once | ORAL | Status: AC
  Administered 2016-05-06: 60 g via ORAL
  Filled 2016-05-06: qty 4

## 2016-05-06 NOTE — Care Management Important Message (Signed)
Important Message  Patient Details  Name: Monique [name] MRN: [id number] Date of Birth: 07/17/1941   Medicare Important Message Given:  Yes    Haskell FlirtJamison, Kemonie Cutillo 05/06/2016, 10:17 AMImportant Message  Patient Details  Name: Monique [name] MRN: [id number] Date of Birth: 06/28/1941   Medicare Important Message Given:  Yes    Haskell FlirtJamison, Kavon Valenza 05/06/2016, 10:17 AM

## 2016-05-06 NOTE — Progress Notes (Signed)
[facility] Progress Note     Subjective: States Monique [name] feels better today than yesterday. Reports large volume liquid stool overnight. Tolerating PO without emesis this AM. Still with mild epigastric pain but denies any other abdominal pain.   Discussed the possibility of surgery, including possible colostomy, with the patient, based on her recurring obstructive symptoms. Monique [name] is agreeable to an operation, should it be her best opportunity to recover.  Objective: Vital signs in last 24 hours: Temp:  [97.8 F (36.6 C)-98.7 F (37.1 C)] 98.7 F (37.1 C) (10/04 0640) Pulse Rate:  [98-102] 100 (10/04 0640) Resp:  [17-18] 18 (10/04 0640) BP: (135-144)/(77-87) 135/77 (10/04 0640) SpO2:  [99 %-100 %] 100 % (10/04 0640) Last BM Date: 05/06/16  Intake/Output from previous day: 10/03 0701 - 10/04 0700 In: 690 [P.O.:690] Out: -  Intake/Output this shift: No intake/output data recorded.  PE: Gen:  Alert, NAD, cooperative Card:  RRR, no M/G/R heard Pulm:  CTA, no W/R/R Abd: Soft, NT, distention improved from yesterday, +BS Ext:  No erythema, edema, or tenderness Neuro: A&Ox3  Lab Results:   Recent Labs  05/04/16 0714  WBC 7.3  HGB 12.0  HCT 36.5  PLT 202   BMET No results for input(s): NA, K, CL, CO2, GLUCOSE, BUN, CREATININE, CALCIUM in the last 72 hours. PT/INR No results for input(s): LABPROT, INR in the last 72 hours. CMP     Component Value Date/Time   NA 140 04/30/2016 0422   K 3.8 04/30/2016 0422   CL 97 (L) 04/30/2016 0422   CO2 35 (H) 04/30/2016 0422   GLUCOSE 127 (H) 04/30/2016 0422   BUN 17 04/30/2016 0422   CREATININE 0.77 04/30/2016 0422   CALCIUM 8.7 (L) 04/30/2016 0422   PROT 6.9 04/21/2016 1735   ALBUMIN 3.5 04/21/2016 1735   AST 19 04/21/2016 1735   ALT 23 04/21/2016 1735   ALKPHOS 60 04/21/2016 1735   BILITOT 1.2 04/21/2016 1735   GFRNONAA >60 04/30/2016 0422   GFRAA >60 04/30/2016 0422   Lipase     Component Value Date/Time   LIPASE 19 04/21/2016 1735   Studies/Results: Dg Abd Portable 2v  Result Date: 05/05/2016 CLINICAL DATA:  Epigastric pain, lower abdominal cramping EXAM: PORTABLE ABDOMEN - 2 VIEW COMPARISON:  04/30/2016 FINDINGS: There is gaseous distension of the stomach. There is small bowel dilatation measuring up to 4.8 cm. There is air seen scattered within the colon and rectum. There are multiple small bowel air-fluid levels. There is no evidence of pneumoperitoneum, portal venous gas, or pneumatosis. There are no pathologic calcifications along the expected course of the ureters. The osseous structures are unremarkable. IMPRESSION: 1. Small bowel dilatation measuring up to 4.8 cm with multiple air-fluid levels concerning for small bowel obstruction. Electronically Signed   By: Elige Ko   On: 05/05/2016 16:54    Anti-infectives: Anti-infectives    Start     Dose/Rate Route Frequency Ordered Stop   05/04/16 1100  amoxicillin-clavulanate (AUGMENTIN) 875-125 MG per tablet 1 tablet     1 tablet Oral Every 12 hours 05/04/16 1015     04/21/16 2015  piperacillin-tazobactam (ZOSYN) IVPB 3.375 g  Status:  Discontinued     3.375 g 12.5 mL/hr over 240 Minutes Intravenous Every 8 hours 04/21/16 2014 05/04/16 1015   04/21/16 1700  ciprofloxacin (CIPRO) IVPB 400 mg     400 mg 200 mL/hr over 60 Minutes Intravenous  Once 04/21/16 1652 04/21/16 1952   04/21/16 1700  metroNIDAZOLE (FLAGYL) IVPB  500 mg     500 mg 100 mL/hr over 60 Minutes Intravenous  Once 04/21/16 1652 04/21/16 1834   04/21/16 1645  clindamycin (CLEOCIN) IVPB 300 mg  Status:  Discontinued     300 mg 100 mL/hr over 30 Minutes Intravenous  Once 04/21/16 1643 04/21/16 1651   04/21/16 1645  clindamycin (CLEOCIN) IVPB 600 mg  Status:  Discontinued     600 mg 100 mL/hr over 30 Minutes Intravenous  Once 04/21/16 1644 04/21/16 1651       Assessment/Plan Diverticulitis with perforation and abscess - WBC normalized, now on PO Augmentin  SBO -  persistent nausea and abd distention, despite scheduled antiemetics, PPI, and bowel function - improved this AM. - full liquid diet  - Abdominal film 10/3 significant for partial SBO - Repeat abdominal film this AM. Suspect the patient may need diverting colostomy. Will discuss with MD.  Narcotic Dependence  - NORCO 10 mg, no withdrawal sxs   FEN: full liquids ID: Zosyn 9/20-10/1, Augmentin 10/2 >> DVT Proph: Lovenox, SCD's   LOS: 15 days    Adam PhenixElizabeth S Janece Laidlaw , [facility] 05/06/2016, 7:33 AM Pager: [phone] Consults: [phone] Mon-Fri 7:00 am-4:30 pm Sat-Sun 7:00 am-11:30 am

## 2016-05-07 ENCOUNTER — Inpatient Hospital Stay (HOSPITAL_COMMUNITY): Payer: Medicare Other

## 2016-05-07 ENCOUNTER — Inpatient Hospital Stay (HOSPITAL_COMMUNITY): Payer: Medicare Other | Admitting: Anesthesiology

## 2016-05-07 ENCOUNTER — Encounter (HOSPITAL_COMMUNITY): Payer: Self-pay | Admitting: Anesthesiology

## 2016-05-07 ENCOUNTER — Encounter (HOSPITAL_COMMUNITY): Admission: EM | Disposition: A | Discharge status: Home Health | Payer: Self-pay | Source: Home / Self Care

## 2016-05-07 HISTORY — PX: COLECTOMY WITH COLOSTOMY CREATION/HARTMANN PROCEDURE: SHX6598

## 2016-05-07 HISTORY — PX: LAPAROTOMY: SHX154

## 2016-05-07 LAB — SURGICAL PCR SCREEN
MRSA, PCR: POSITIVE — AB
STAPHYLOCOCCUS AUREUS: POSITIVE — AB

## 2016-05-07 LAB — TYPE AND SCREEN
ABO/RH(D): A POS
Antibody Screen: NEGATIVE

## 2016-05-07 SURGERY — LAPAROTOMY, EXPLORATORY
Anesthesia: General | Site: Abdomen

## 2016-05-07 MED ORDER — HYDROMORPHONE HCL 1 MG/ML IJ SOLN
INTRAMUSCULAR | Status: AC
  Administered 2016-05-07: 0.5 mg via INTRAVENOUS
  Filled 2016-05-07: qty 1

## 2016-05-07 MED ORDER — HYDROMORPHONE 1 MG/ML IV SOLN
INTRAVENOUS | Status: AC
  Filled 2016-05-07: qty 25

## 2016-05-07 MED ORDER — FENTANYL CITRATE (PF) 100 MCG/2ML IJ SOLN
25.0000 ug | INTRAMUSCULAR | Status: DC | PRN
  Administered 2016-05-07: 25 ug via INTRAVENOUS
  Administered 2016-05-07: 50 ug via INTRAVENOUS
  Administered 2016-05-07: 25 ug via INTRAVENOUS

## 2016-05-07 MED ORDER — CHLORHEXIDINE GLUCONATE CLOTH 2 % EX PADS
6.0000 | MEDICATED_PAD | Freq: Every day | CUTANEOUS | Status: AC
  Administered 2016-05-07 – 2016-05-11 (×5): 6 via TOPICAL

## 2016-05-07 MED ORDER — ONDANSETRON HCL 4 MG/2ML IJ SOLN
INTRAMUSCULAR | Status: AC
  Administered 2016-05-07: 4 mg via INTRAVENOUS
  Filled 2016-05-07: qty 2

## 2016-05-07 MED ORDER — SUFENTANIL CITRATE 50 MCG/ML IV SOLN
INTRAVENOUS | Status: AC
  Filled 2016-05-07: qty 1

## 2016-05-07 MED ORDER — PROMETHAZINE HCL 25 MG/ML IJ SOLN
6.2500 mg | Freq: Once | INTRAMUSCULAR | Status: AC
  Administered 2016-05-07: 6.25 mg via INTRAVENOUS

## 2016-05-07 MED ORDER — MIDAZOLAM HCL 2 MG/2ML IJ SOLN
INTRAMUSCULAR | Status: AC
  Filled 2016-05-07: qty 2

## 2016-05-07 MED ORDER — ONDANSETRON HCL 4 MG/2ML IJ SOLN
4.0000 mg | Freq: Once | INTRAMUSCULAR | Status: AC
Start: 2016-05-07 — End: 2016-05-07
  Administered 2016-05-07: 4 mg via INTRAVENOUS

## 2016-05-07 MED ORDER — SODIUM CHLORIDE 0.9% FLUSH
10.0000 mL | Freq: Two times a day (BID) | INTRAVENOUS | Status: DC
  Administered 2016-05-08 – 2016-05-15 (×6): 10 mL

## 2016-05-07 MED ORDER — SODIUM CHLORIDE 0.9 % IJ SOLN
INTRAMUSCULAR | Status: AC
  Filled 2016-05-07: qty 10

## 2016-05-07 MED ORDER — MUPIROCIN 2 % EX OINT
1.0000 "application " | TOPICAL_OINTMENT | Freq: Two times a day (BID) | CUTANEOUS | Status: AC
  Administered 2016-05-07 – 2016-05-11 (×8): 1 via NASAL
  Filled 2016-05-07 (×3): qty 22

## 2016-05-07 MED ORDER — ACETAMINOPHEN 10 MG/ML IV SOLN
INTRAVENOUS | Status: AC
  Filled 2016-05-07: qty 100

## 2016-05-07 MED ORDER — SUCCINYLCHOLINE CHLORIDE 20 MG/ML IJ SOLN
INTRAMUSCULAR | Status: DC | PRN
  Administered 2016-05-07: 100 mg via INTRAVENOUS

## 2016-05-07 MED ORDER — SODIUM CHLORIDE 0.9% FLUSH
10.0000 mL | INTRAVENOUS | Status: DC | PRN
  Administered 2016-05-09 – 2016-05-14 (×5): 10 mL
  Administered 2016-05-15: 20 mL
  Filled 2016-05-07 (×6): qty 40

## 2016-05-07 MED ORDER — DEXAMETHASONE SODIUM PHOSPHATE 10 MG/ML IJ SOLN
INTRAMUSCULAR | Status: AC
  Filled 2016-05-07: qty 1

## 2016-05-07 MED ORDER — LABETALOL HCL 5 MG/ML IV SOLN
INTRAVENOUS | Status: DC | PRN
  Administered 2016-05-07 (×2): 5 mg via INTRAVENOUS

## 2016-05-07 MED ORDER — SUGAMMADEX SODIUM 500 MG/5ML IV SOLN
INTRAVENOUS | Status: AC
  Filled 2016-05-07: qty 5

## 2016-05-07 MED ORDER — LACTATED RINGERS IV SOLN
INTRAVENOUS | Status: DC | PRN
  Administered 2016-05-07 (×2): via INTRAVENOUS

## 2016-05-07 MED ORDER — SUFENTANIL CITRATE 50 MCG/ML IV SOLN
INTRAVENOUS | Status: DC | PRN
  Administered 2016-05-07: 10 ug via INTRAVENOUS
  Administered 2016-05-07: 5 ug via INTRAVENOUS
  Administered 2016-05-07 (×5): 10 ug via INTRAVENOUS
  Administered 2016-05-07 (×2): 15 ug via INTRAVENOUS
  Administered 2016-05-07: 5 ug via INTRAVENOUS

## 2016-05-07 MED ORDER — ENOXAPARIN SODIUM 40 MG/0.4ML ~~LOC~~ SOLN
40.0000 mg | SUBCUTANEOUS | Status: DC
  Administered 2016-05-08 – 2016-05-19 (×11): 40 mg via SUBCUTANEOUS
  Filled 2016-05-07 (×10): qty 0.4

## 2016-05-07 MED ORDER — FENTANYL CITRATE (PF) 100 MCG/2ML IJ SOLN
INTRAMUSCULAR | Status: AC
  Administered 2016-05-07: 25 ug via INTRAVENOUS
  Filled 2016-05-07: qty 2

## 2016-05-07 MED ORDER — PROMETHAZINE HCL 25 MG/ML IJ SOLN
INTRAMUSCULAR | Status: AC
Start: 2016-05-07 — End: 2016-05-07
  Administered 2016-05-07: 6.25 mg via INTRAVENOUS
  Filled 2016-05-07: qty 1

## 2016-05-07 MED ORDER — ROCURONIUM BROMIDE 100 MG/10ML IV SOLN
INTRAVENOUS | Status: DC | PRN
  Administered 2016-05-07: 45 mg via INTRAVENOUS
  Administered 2016-05-07: 5 mg via INTRAVENOUS
  Administered 2016-05-07: 10 mg via INTRAVENOUS

## 2016-05-07 MED ORDER — ACETAMINOPHEN 10 MG/ML IV SOLN
INTRAVENOUS | Status: DC | PRN
  Administered 2016-05-07: 1000 mg via INTRAVENOUS

## 2016-05-07 MED ORDER — SODIUM CHLORIDE 0.9% FLUSH: 9.0000 mL | INTRAVENOUS | Status: DC | PRN

## 2016-05-07 MED ORDER — CEFOTETAN DISODIUM-DEXTROSE 2-2.08 GM-% IV SOLR
INTRAVENOUS | Status: AC
  Filled 2016-05-07: qty 50

## 2016-05-07 MED ORDER — ONDANSETRON HCL 4 MG/2ML IJ SOLN
INTRAMUSCULAR | Status: AC
  Filled 2016-05-07: qty 2

## 2016-05-07 MED ORDER — KCL IN DEXTROSE-NACL 20-5-0.45 MEQ/L-%-% IV SOLN
INTRAVENOUS | Status: DC
  Administered 2016-05-07 – 2016-05-11 (×7): via INTRAVENOUS
  Administered 2016-05-12: 1000 mL via INTRAVENOUS
  Administered 2016-05-12 – 2016-05-15 (×3): via INTRAVENOUS
  Filled 2016-05-07 (×18): qty 1000

## 2016-05-07 MED ORDER — VENLAFAXINE HCL ER 150 MG PO CP24
150.0000 mg | ORAL_CAPSULE | Freq: Every day | ORAL | Status: DC
  Administered 2016-05-07 – 2016-05-18 (×12): 150 mg via ORAL
  Filled 2016-05-07 (×12): qty 1

## 2016-05-07 MED ORDER — LIDOCAINE HCL (CARDIAC) 20 MG/ML IV SOLN
INTRAVENOUS | Status: DC | PRN
  Administered 2016-05-07: 80 mg via INTRAVENOUS
  Administered 2016-05-07: 20 mg via INTRATRACHEAL

## 2016-05-07 MED ORDER — HYDROMORPHONE 1 MG/ML IV SOLN
INTRAVENOUS | Status: DC
  Administered 2016-05-07: 3 mg via INTRAVENOUS
  Administered 2016-05-07: 17:00:00 via INTRAVENOUS
  Administered 2016-05-08: 3.3 mg via INTRAVENOUS
  Administered 2016-05-08: 1.8 mg via INTRAVENOUS
  Administered 2016-05-08: 0.9 mg via INTRAVENOUS
  Administered 2016-05-08: 2.1 mg via INTRAVENOUS
  Administered 2016-05-08: 1.2 mg via INTRAVENOUS
  Administered 2016-05-08: 3.3 mg via INTRAVENOUS
  Administered 2016-05-09: 2.1 mg via INTRAVENOUS
  Administered 2016-05-09: 1.5 mg via INTRAVENOUS
  Administered 2016-05-09: 3 mg via INTRAVENOUS
  Administered 2016-05-09: 0.8 mg via INTRAVENOUS
  Administered 2016-05-09: 1.8 mg via INTRAVENOUS
  Administered 2016-05-09: 08:00:00 via INTRAVENOUS
  Administered 2016-05-10: 1.8 mg via INTRAVENOUS
  Administered 2016-05-10: 0.3 mg via INTRAVENOUS
  Administered 2016-05-10: 2.1 mg via INTRAVENOUS
  Administered 2016-05-10: 1.8 mg via INTRAVENOUS
  Administered 2016-05-10: 1.5 mg via INTRAVENOUS
  Administered 2016-05-10: 0.6 mg via INTRAVENOUS
  Administered 2016-05-11: 2.4 mg via INTRAVENOUS
  Administered 2016-05-11: 1.8 mg via INTRAVENOUS
  Administered 2016-05-11: 2.4 mg via INTRAVENOUS
  Administered 2016-05-11: 1.2 mg via INTRAVENOUS
  Administered 2016-05-11: 2.4 mg via INTRAVENOUS
  Administered 2016-05-11: 1.5 mg via INTRAVENOUS
  Administered 2016-05-11: 0.9 mg via INTRAVENOUS
  Administered 2016-05-12: 1.8 mg via INTRAVENOUS
  Administered 2016-05-12: 2.4 mg via INTRAVENOUS
  Administered 2016-05-12 (×2): 1.5 mg via INTRAVENOUS
  Administered 2016-05-12: 2.4 mg via INTRAVENOUS
  Administered 2016-05-13: 2.7 mg via INTRAVENOUS
  Administered 2016-05-13: 2.3 mg via INTRAVENOUS
  Administered 2016-05-13: 3.6 mg via INTRAVENOUS
  Administered 2016-05-13: 12:00:00 via INTRAVENOUS
  Administered 2016-05-13: 1.2 mg via INTRAVENOUS
  Administered 2016-05-14: 0.9 mg via INTRAVENOUS
  Administered 2016-05-14 (×2): 3 mg via INTRAVENOUS
  Administered 2016-05-14: 1.5 mg via INTRAVENOUS
  Administered 2016-05-14: 1.2 mg via INTRAVENOUS
  Administered 2016-05-14: 0.6 mg via INTRAVENOUS
  Administered 2016-05-15 (×2): 1.2 mg via INTRAVENOUS
  Administered 2016-05-15: 4.8 mg via INTRAVENOUS
  Filled 2016-05-07 (×3): qty 25

## 2016-05-07 MED ORDER — PROPOFOL 10 MG/ML IV BOLUS
INTRAVENOUS | Status: DC | PRN
  Administered 2016-05-07: 150 mg via INTRAVENOUS

## 2016-05-07 MED ORDER — PROPOFOL 10 MG/ML IV BOLUS
INTRAVENOUS | Status: AC
  Filled 2016-05-07: qty 20

## 2016-05-07 MED ORDER — SUGAMMADEX SODIUM 200 MG/2ML IV SOLN
INTRAVENOUS | Status: DC | PRN
  Administered 2016-05-07: 175 mg via INTRAVENOUS

## 2016-05-07 MED ORDER — 0.9 % SODIUM CHLORIDE (POUR BTL) OPTIME
TOPICAL | Status: DC | PRN
  Administered 2016-05-07: 1000 mL

## 2016-05-07 MED ORDER — LABETALOL HCL 5 MG/ML IV SOLN: 5.0000 mg | INTRAVENOUS | Status: DC | PRN

## 2016-05-07 MED ORDER — LACTATED RINGERS IV SOLN
INTRAVENOUS | Status: DC | PRN
  Administered 2016-05-07: 11:00:00 via INTRAVENOUS

## 2016-05-07 MED ORDER — HYDROMORPHONE HCL 1 MG/ML IJ SOLN
0.2500 mg | INTRAMUSCULAR | Status: DC | PRN
  Administered 2016-05-07 (×4): 0.5 mg via INTRAVENOUS

## 2016-05-07 MED ORDER — DEXTROSE 5 % IV SOLN
500.0000 mg | Freq: Three times a day (TID) | INTRAVENOUS | Status: DC | PRN
  Administered 2016-05-07: 500 mg via INTRAVENOUS
  Filled 2016-05-07: qty 550
  Filled 2016-05-07: qty 5

## 2016-05-07 MED ORDER — CEFAZOLIN SODIUM-DEXTROSE 2-4 GM/100ML-% IV SOLN
INTRAVENOUS | Status: AC
  Filled 2016-05-07: qty 100

## 2016-05-07 MED ORDER — NALOXONE HCL 0.4 MG/ML IJ SOLN: 0.4000 mg | INTRAMUSCULAR | Status: DC | PRN

## 2016-05-07 SURGICAL SUPPLY — 52 items
BLADE EXTENDED COATED 6.5IN (ELECTRODE) IMPLANT
CELLS DAT CNTRL 66122 CELL SVR (MISCELLANEOUS) ×2 IMPLANT
CHLORAPREP W/TINT 26ML (MISCELLANEOUS) IMPLANT
COUNTER NEEDLE 20 DBL MAG RED (NEEDLE) ×4 IMPLANT
COVER MAYO STAND STRL (DRAPES) ×12 IMPLANT
COVER SURGICAL LIGHT HANDLE (MISCELLANEOUS) IMPLANT
DRAIN CHANNEL 19F RND (DRAIN) IMPLANT
DRAPE LAPAROSCOPIC ABDOMINAL (DRAPES) ×4 IMPLANT
DRAPE SHEET LG 3/4 BI-LAMINATE (DRAPES) ×4 IMPLANT
DRSG OPSITE POSTOP 4X10 (GAUZE/BANDAGES/DRESSINGS) IMPLANT
DRSG OPSITE POSTOP 4X6 (GAUZE/BANDAGES/DRESSINGS) IMPLANT
DRSG OPSITE POSTOP 4X8 (GAUZE/BANDAGES/DRESSINGS) ×4 IMPLANT
DRSG TELFA 3X8 NADH (GAUZE/BANDAGES/DRESSINGS) ×4 IMPLANT
ELECT PENCIL ROCKER SW 15FT (MISCELLANEOUS) ×8 IMPLANT
ELECT REM PT RETURN 15FT ADLT (MISCELLANEOUS) ×4 IMPLANT
EVACUATOR SILICONE 100CC (DRAIN) IMPLANT
GAUZE SPONGE 4X4 12PLY STRL (GAUZE/BANDAGES/DRESSINGS) IMPLANT
GLOVE BIO SURGEON STRL SZ 6.5 (GLOVE) ×6 IMPLANT
GLOVE BIO SURGEONS STRL SZ 6.5 (GLOVE) ×2
GLOVE BIOGEL PI IND STRL 7.0 (GLOVE) ×4 IMPLANT
GLOVE BIOGEL PI INDICATOR 7.0 (GLOVE) ×4
GOWN STRL REUS W/TWL 2XL LVL3 (GOWN DISPOSABLE) ×8 IMPLANT
GOWN STRL REUS W/TWL XL LVL3 (GOWN DISPOSABLE) ×4 IMPLANT
HANDLE SUCTION POOLE (INSTRUMENTS) IMPLANT
LEGGING LITHOTOMY PAIR STRL (DRAPES) ×4 IMPLANT
PACK COLON (CUSTOM PROCEDURE TRAY) ×4 IMPLANT
PAD TELFA 2X3 NADH STRL (GAUZE/BANDAGES/DRESSINGS) ×8 IMPLANT
RETRACTOR WND ALEXIS 25 LRG (MISCELLANEOUS) IMPLANT
RTRCTR WOUND ALEXIS 18CM MED (MISCELLANEOUS) ×4
RTRCTR WOUND ALEXIS 25CM LRG (MISCELLANEOUS)
SEALER TISSUE X1 CVD JAW (INSTRUMENTS) ×4 IMPLANT
SPONGE LAP 18X18 X RAY DECT (DISPOSABLE) ×8 IMPLANT
STAPLER CUT CVD 40MM BLUE (STAPLE) ×4 IMPLANT
STAPLER CUT RELOAD BLUE (STAPLE) ×8 IMPLANT
STAPLER VISISTAT 35W (STAPLE) ×4 IMPLANT
SUCTION POOLE HANDLE (INSTRUMENTS)
SUT ETHILON 3 0 PS 1 (SUTURE) IMPLANT
SUT NOVA NAB GS-21 0 18 T12 DT (SUTURE) IMPLANT
SUT PDS AB 1 CTX 36 (SUTURE) ×8 IMPLANT
SUT PROLENE 2 0 KS (SUTURE) ×4 IMPLANT
SUT SILK 2 0 (SUTURE) ×2
SUT SILK 2 0 SH CR/8 (SUTURE) ×4 IMPLANT
SUT SILK 2-0 18XBRD TIE 12 (SUTURE) ×2 IMPLANT
SUT SILK 3 0 (SUTURE) ×2
SUT SILK 3 0 SH CR/8 (SUTURE) ×4 IMPLANT
SUT SILK 3-0 18XBRD TIE 12 (SUTURE) ×2 IMPLANT
SUT VIC AB 2-0 SH 18 (SUTURE) ×8 IMPLANT
SUT VIC AB 2-0 SH 27 (SUTURE) ×4
SUT VIC AB 2-0 SH 27X BRD (SUTURE) ×4 IMPLANT
TOWEL OR 17X26 10 PK STRL BLUE (TOWEL DISPOSABLE) ×4 IMPLANT
TOWEL OR NON WOVEN STRL DISP B (DISPOSABLE) ×4 IMPLANT
TRAY FOLEY W/METER SILVER 16FR (SET/KITS/TRAYS/PACK) ×4 IMPLANT

## 2016-05-07 NOTE — Anesthesia Procedure Notes (Signed)
Procedure Name: Intubation Date/Time: 05/07/2016 12:37 PM Performed by: Illene SilverEVANS, Kiasha Bellin E Pre-anesthesia Checklist: Patient identified, Emergency Drugs available, Suction available and Patient being monitored Patient Re-evaluated:Patient Re-evaluated prior to inductionOxygen Delivery Method: Circle system utilized Preoxygenation: Pre-oxygenation with 100% oxygen Intubation Type: IV induction Ventilation: Mask ventilation without difficulty Laryngoscope Size: Mac and 4 Grade View: Grade II Tube type: Oral Tube size: 7.5 mm Number of attempts: 1 Airway Equipment and Method: Stylet and Oral airway Placement Confirmation: ETT inserted through vocal cords under direct vision,  positive ETCO2 and breath sounds checked- equal and bilateral Secured at: 21 cm Tube secured with: Tape Dental Injury: Teeth and Oropharynx as per pre-operative assessment

## 2016-05-07 NOTE — Op Note (Signed)
04/21/2016 - 05/07/2016  3:29 PM  PATIENT:  Monique [name]  75 y.o. female  Patient Care Team: [name], FNP as PCP - General (Nurse Practitioner)  PRE-OPERATIVE DIAGNOSIS:  Perforated diverticulitis  POST-OPERATIVE DIAGNOSIS:  Perforated diverticulitis  PROCEDURE:  EXPLORATORY LAPAROTOMY, LYLSIS OF ADHSIONS, EVACATION OF PERITONEAL ABSCESS COLOSTOMY CREATION/HARTMANN PROCEDURE   Surgeon(s): Romie Levee, MD  ASSISTANT: Hosie Spangle PA-C   ANESTHESIA:   general  EBL: Total I/O In: 1000 [I.V.:1000] Out: 600 [Urine:450; Blood:150]  DRAINS: none   SPECIMEN:  Source of Specimen:  sigmoid colon  DISPOSITION OF SPECIMEN:  PATHOLOGY  COUNTS:  YES  PLAN OF CARE: Transfer back to the floor  PATIENT DISPOSITION:  PACU - hemodynamically stable.  INDICATION: 75 year old female who presents to the hospital with perforated diverticulitis and abscess. She has been hospitalized for over 2 weeks and has had a wax and wane response to medical treatment. It was decided that the most logical course of action would be to proceed to the OR for Hartman's procedure.   OR FINDINGS: Right lower quadrant abscess with adherent small bowel and sigmoid diverticulum  DESCRIPTION: the patient was identified in the preoperative holding area and taken to the OR where they were laid supine on the operating room table.  General anesthesia was induced without difficulty. SCDs were also noted to be in place prior to the initiation of anesthesia.  The patient was then prepped and draped in the usual sterile fashion.   A surgical timeout was performed indicating the correct patient, procedure, positioning and need for preoperative antibiotics.   I made a lower midline incision using a scalpel. Dissection was carried down through subcutaneous tissue using electrocautery. The fascia was incised at midline. The muscle was split and the peritoneum was entered bluntly. The remaining peritoneum  was divided using electrocautery. An Alexis wound protector was placed into the wound. The omentum and free small bowel were packed out of the way using lap sponges. There was significant pelvic adhesions due to prior surgery but these did not appear to be the cause of her obstruction. These were carefully dissected free using sharp Metzenbaum scissors. Once these were free, I was able to locate an abscess in the right lower quadrant. I carefully opened the wall of the abscess and drained the purulent material. It was approximately 4 x 2 cm in diameter. I freed all small bowel from the abscess cavity and relieve the obstruction by doing this. I ran the small bowel from the terminal ileum to the ligament of Treitz and there were no other signs of adhesions. The appendix was normal. The cecum and visualize colon also appeared normal except for the sigmoid colon which was locally inflamed at the area of the abscess. There was one piece of small bowel that had a large area of fibrinous rind. It appeared patent and uninjured and therefore I left this within the abdomen. They're worse multiple nodules noted around the area of the abscess cavity. These were sent for frozen pathologic evaluation. There was no signs of metastatic disease. There were felt to be inflammatory in nature. Term attention to the patient's sigmoid colon. The colon was freed from the lateral pelvic sidewall using electrocautery and blunt dissection. The left ureter was identified in its normal course traversing over the iliac artery. This and other retroperitoneal structures were freed from the colon mesentery. Once the entire sigmoid colon was mobilized, I divided at the rectosigmoid junction using a blue load contour stapler. The  descending colon was divided proximal to the area of inflammation also using a blue load contour stapler. I mobilized more up the lateral sidewall to allow for mobilization to the abdominal wall. An incision was made in  the skin using electrocautery. A core of subcutaneous tissue was taken out. The fascia overlying the rectus was divided in a cruciate manner. The rectus muscle was split. The peritoneum was divided. The remaining colon was brought out of this incision site.  The abdomen was then irrigated with normal saline. Hemostasis was good. The rectal stump was elevated out of the pelvis using 2 Prolene sutures tacked to the peritoneum on either sidewall.  I inspected the abdomen once again. There were no laps left inside the abdomen. Lap count was correct.  I closed the peritoneum over the left ureter as well to prevent adhesions to small bowel. I then removed the Alexis wound protector. We closed the posterior peritoneum using 2-0 Vicryl running sutures. The fascia was then closed using 2-0 PDS running sutures. The subcutaneous tissue was reapproximated using interrupted 2-0 Vicryl sutures. The skin was closed using staples. Telfa wicks were placed in between the staples to allow for drainage and a dressing was applied over the wound. The colostomy was then matured in standard Brook fashion using interrupted 2-0 Vicryl sutures. An ostomy appliance was applied. The patient tolerated procedure well. All counts were correct per operating room staff. The patient was sent to the postanesthesia care unit in stable condition.

## 2016-05-07 NOTE — Progress Notes (Signed)
Nutrition Follow-up  DOCUMENTATION CODES:   Obesity unspecified  INTERVENTION:   Recommend initiation of nutrition support post surgery if unable to advance diet. Pt is at malnutrition risk.  Recommend obtain current weight. Last weight recorded 9/19 (16 days ago). Need to assess weight status given poor PO intake x 2 weeks.  Continue Boost Breeze po TID, each supplement provides 250 kcal and 9 grams of protein upon diet advancement.  RD to continue to monitor for plan.  NUTRITION DIAGNOSIS:   Inadequate oral intake related to nausea, vomiting as evidenced by per patient/family report, meal completion < 25%.  Ongoing, NPO.  GOAL:   Patient will meet greater than or equal to 90% of their needs  Not meeting, NPO.  MONITOR:   PO intake, Supplement acceptance, Diet advancement, Labs, Weight trends, I & O's  ASSESSMENT:   75 y.o. female who had a busy weekend but began feeling bad Sunday night [place]  She has had a hip replacement and takes steroid injections per [place] and she last received one 2 weeks ago.  She admits to constipation since she is on chronic pain meds.  She is followed by Dr. [place]had Badger and his NP ordered a CT today which showed a perforation/abscess in the right lower quadrant.  Patient planned for surgery today, resection and colostomy. Pt was tolerating PO intake of full liquids yesterday, 100% completion. Recommend nutrition support initiation if pt unable to advance diet post surgery. Pt is at risk of malnutrition with poor PO intake (not including yesterday) x 15 days. Would like new weight to assess weight status after 16 days of poor PO intake.  Lab reviewed. Medications: Zofran -ODT every 6 hours PRN, Phenergan IV PRN  Diet Order:  Diet NPO time specified Except for: Sips with Meds  Skin:  Reviewed, no issues  Last BM:  10/4  Height:   Ht Readings from Last 1 Encounters:  04/21/16 5\' 5"  (1.651 m)     Weight:   Wt Readings from Last 1 Encounters:  04/21/16 180 lb 5.4 oz (81.8 kg)    Ideal Body Weight:  57 kg  BMI:  Body mass index is 30.01 kg/m.  Estimated Nutritional Needs:   Kcal:  1800-2000  Protein:  95-105 grams  Fluid:  1.8-2.0 L/day  EDUCATION NEEDS:   No education needs identified at this time  Tilda FrancoLindsey Genever Hentges, MS, RD, LDN Pager: [phone] After Hours Pager: [phone]

## 2016-05-07 NOTE — Progress Notes (Signed)
CRITICAL VALUE ALERT  Critical value received:  MRSA PCR Positive  Date of notification:  05/07/2016  Time of notification:  0320  Critical value read back:Yes.    Nurse who received alert:  Billy FischerLynn Amarachi Kotz, RN  MRSA PCR Positive protocol initiated. Patient education completed. Will report to oncoming shift.

## 2016-05-07 NOTE — Anesthesia Procedure Notes (Signed)
Central Venous Catheter Insertion Performed by: anesthesiologist 05/07/2016 12:50 PM Patient location: Pre-op. Preanesthetic checklist: patient identified, IV checked, site marked, risks and benefits discussed, surgical consent, monitors and equipment checked, pre-op evaluation, timeout performed and anesthesia consent Position: Trendelenburg Lidocaine 1% used for infiltration Landmarks identified and Seldinger technique used Catheter size: 8 Fr Central line was placed.Double lumen Procedure performed using ultrasound guided technique. Attempts: 1 Following insertion, dressing applied, line sutured and Biopatch. Post procedure assessment: blood return through all ports. Patient tolerated the procedure well with no immediate complications.

## 2016-05-07 NOTE — Anesthesia Preprocedure Evaluation (Addendum)
Anesthesia Evaluation  Patient identified by MRN, date of birth, ID band Patient awake    Reviewed: Allergy & Precautions, NPO status , Patient's Chart, lab work & pertinent test results  Airway Mallampati: I  TM Distance: >3 FB Neck ROM: Full    Dental  (+) Teeth Intact, Dental Advisory Given   Pulmonary asthma ,    breath sounds clear to auscultation       Cardiovascular hypertension, Pt. on medications  Rhythm:Regular Rate:Normal     Neuro/Psych  Neuromuscular disease negative psych ROS   GI/Hepatic Neg liver ROS, hiatal hernia,   Endo/Other  negative endocrine ROS  Renal/GU negative Renal ROS  negative genitourinary   Musculoskeletal  (+) Arthritis , Osteoarthritis,    Abdominal (+) + obese,   Peds negative pediatric ROS (+)  Hematology   Anesthesia Other Findings   Reproductive/Obstetrics negative OB ROS                            Lab Results  Component Value Date   WBC 7.3 05/04/2016   HGB 12.0 05/04/2016   HCT 36.5 05/04/2016   MCV 93.6 05/04/2016   PLT 202 05/04/2016   Lab Results  Component Value Date   CREATININE 0.79 05/06/2016   BUN 21 (H) 05/06/2016   NA 139 05/06/2016   K 3.9 05/06/2016   CL 105 05/06/2016   CO2 27 05/06/2016   Lab Results  Component Value Date   INR 1.02 07/02/2011   04/2016 EKG: normal sinus rhythm.  Anesthesia Physical Anesthesia Plan  ASA: II  Anesthesia Plan: General   Post-op Pain Management:    Induction: Intravenous  Airway Management Planned: Oral ETT  Additional Equipment:   Intra-op Plan: Utilization Of Total Body Hypothermia per surgeon request  Post-operative Plan: Extubation in OR  Informed Consent: I have reviewed the patients History and Physical, chart, labs and discussed the procedure including the risks, benefits and alternatives for the proposed anesthesia with the patient or authorized representative who has  indicated his/her understanding and acceptance.   Dental advisory given  Plan Discussed with: CRNA  Anesthesia Plan Comments:        Anesthesia Quick Evaluation

## 2016-05-07 NOTE — Transfer of Care (Signed)
Immediate Anesthesia Transfer of Care Note  Patient: Monique KluverAnn T [name]  Procedure(s) Performed: Procedure(s): EXPLORATORY LAPAROTOMY, LYLSIS OF ADHSIONS, EVACATION OF PERITONEAL ABSCESS (N/A) COLOSTOMY CREATION/HARTMANN PROCEDURE (N/A)  Patient Location: PACU  Anesthesia Type:General  Level of Consciousness: awake, alert  and oriented  Airway & Oxygen Therapy: Patient Spontanous Breathing and Patient connected to face mask oxygen  Post-op Assessment: Report given to RN and Post -op Vital signs reviewed and stable  Post vital signs: Reviewed and stable  Last Vitals:  Vitals:   05/06/16 2116 05/07/16 0518  BP: (!) 154/75 127/74  Pulse: 88 88  Resp: 20 18  Temp: 36.8 C 36.6 C    Last Pain:  Vitals:   05/07/16 0610  TempSrc:   PainSc: Asleep      Patients Stated Pain Goal: 2 (05/07/16 0510)  Complications: No apparent anesthesia complications

## 2016-05-07 NOTE — Progress Notes (Signed)
[facility] Progress Note  Day of Surgery  Subjective: Long discussion yesterday with family.  We have decided to proceed with surgery.  No further questions or compalints  Objective: Vital signs in last 24 hours: Temp:  [97.4 F (36.3 C)-98.2 F (36.8 C)] 97.8 F (36.6 C) (10/05 0518) Pulse Rate:  [87-88] 88 (10/05 0518) Resp:  [18-20] 18 (10/05 0518) BP: (127-156)/(74-86) 127/74 (10/05 0518) SpO2:  [99 %-100 %] 100 % (10/05 0518) Last BM Date: 05/06/16  Intake/Output from previous day: 10/04 0701 - 10/05 0700 In: 240 [P.O.:240] Out: 401 [Urine:401] Intake/Output this shift: No intake/output data recorded.  PE: Gen:  Alert, NAD, cooperative Card:  RRR, no M/G/R heard Pulm:  CTA, no W/R/R Abd: Soft, NT, distention Ext:  No erythema, edema, or tenderness Neuro: A&Ox3  Lab Results:  No results for input(s): WBC, HGB, HCT, PLT in the last 72 hours. BMET  Recent Labs  05/06/16 0731  NA 139  K 3.9  CL 105  CO2 27  GLUCOSE 124*  BUN 21*  CREATININE 0.79  CALCIUM 8.9   PT/INR No results for input(s): LABPROT, INR in the last 72 hours. CMP     Component Value Date/Time   NA 139 05/06/2016 0731   K 3.9 05/06/2016 0731   CL 105 05/06/2016 0731   CO2 27 05/06/2016 0731   GLUCOSE 124 (H) 05/06/2016 0731   BUN 21 (H) 05/06/2016 0731   CREATININE 0.79 05/06/2016 0731   CALCIUM 8.9 05/06/2016 0731   PROT 6.3 (L) 05/06/2016 0731   ALBUMIN 3.5 05/06/2016 0731   AST 18 05/06/2016 0731   ALT 29 05/06/2016 0731   ALKPHOS 68 05/06/2016 0731   BILITOT 0.8 05/06/2016 0731   GFRNONAA >60 05/06/2016 0731   GFRAA >60 05/06/2016 0731   Lipase     Component Value Date/Time   LIPASE 19 04/21/2016 1735   Studies/Results: Dg Abd Portable 1v  Result Date: 05/06/2016 CLINICAL DATA:  Follow-up small bowel obstruction. Upper abdominal discomfort. EXAM: PORTABLE ABDOMEN - 1 VIEW COMPARISON:  05/05/2016 FINDINGS: Continued small bowel distention, slightly  improved since prior study in the left abdomen. No free air organomegaly. Bilateral renal stones and gallstones all are noted. IMPRESSION: Continued small bowel obstruction pattern, improving slightly since prior study. Electronically Signed   By: Charlett NoseKevin  Dover M.D.   On: 05/06/2016 10:48   Dg Abd Portable 2v  Result Date: 05/05/2016 CLINICAL DATA:  Epigastric pain, lower abdominal cramping EXAM: PORTABLE ABDOMEN - 2 VIEW COMPARISON:  04/30/2016 FINDINGS: There is gaseous distension of the stomach. There is small bowel dilatation measuring up to 4.8 cm. There is air seen scattered within the colon and rectum. There are multiple small bowel air-fluid levels. There is no evidence of pneumoperitoneum, portal venous gas, or pneumatosis. There are no pathologic calcifications along the expected course of the ureters. The osseous structures are unremarkable. IMPRESSION: 1. Small bowel dilatation measuring up to 4.8 cm with multiple air-fluid levels concerning for small bowel obstruction. Electronically Signed   By: Elige KoHetal  Patel   On: 05/05/2016 16:54    Anti-infectives: Anti-infectives    Start     Dose/Rate Route Frequency Ordered Stop   05/07/16 0930  [MAR Hold]  cefoTEtan (CEFOTAN) 2 g in dextrose 5 % 50 mL IVPB     (MAR Hold since 05/07/16 1101)   2 g 100 mL/hr over 30 Minutes Intravenous On call to O.R. 05/06/16 1635 05/08/16 0559   05/04/16 1100  amoxicillin-clavulanate (AUGMENTIN) 875-125 MG  per tablet 1 tablet  Status:  Discontinued     1 tablet Oral Every 12 hours 05/04/16 1015 05/06/16 1635   04/21/16 2015  piperacillin-tazobactam (ZOSYN) IVPB 3.375 g  Status:  Discontinued     3.375 g 12.5 mL/hr over 240 Minutes Intravenous Every 8 hours 04/21/16 2014 05/04/16 1015   04/21/16 1700  ciprofloxacin (CIPRO) IVPB 400 mg     400 mg 200 mL/hr over 60 Minutes Intravenous  Once 04/21/16 1652 04/21/16 1952   04/21/16 1700  metroNIDAZOLE (FLAGYL) IVPB 500 mg     500 mg 100 mL/hr over 60 Minutes  Intravenous  Once 04/21/16 1652 04/21/16 1834   04/21/16 1645  clindamycin (CLEOCIN) IVPB 300 mg  Status:  Discontinued     300 mg 100 mL/hr over 30 Minutes Intravenous  Once 04/21/16 1643 04/21/16 1651   04/21/16 1645  clindamycin (CLEOCIN) IVPB 600 mg  Status:  Discontinued     600 mg 100 mL/hr over 30 Minutes Intravenous  Once 04/21/16 1644 04/21/16 1651       Assessment/Plan Diverticulitis with perforation and abscess - WBC normalized, now on PO Augmentin  SBO - persistent nausea and abd distention, despite scheduled antiemetics, PPI, and bowel function - inability to tolerate a liquid diet consistently - OR today  The surgery and anatomy were described to the patient as well as the risks of surgery and the possible complications.  These include: Bleeding, deep abdominal infections and possible wound complications such as hernia and infection, damage to adjacent structures, leak of surgical connections, which can lead to other surgeries and possibly an ostomy, possible need for other procedures, such as abscess drains in radiology, possible prolonged hospital stay, possible diarrhea from removal of part of the colon, possible constipation from narcotics, prolonged fatigue/weakness or appetite loss, possible early recurrence of of disease, possible complications of their medical problems such as heart disease or arrhythmias or lung problems, death (less than 1%). I believe the patient understands and wishes to proceed with the surgery.  Narcotic Dependence  - NORCO 10 mg, no withdrawal sxs   FEN: full liquids ID: Zosyn 9/20-10/1, Augmentin 10/2 >> DVT Proph: Lovenox, SCD's   LOS: 16 days    Vanita Panda, MD  Colorectal and General Surgery [facility]

## 2016-05-07 NOTE — Anesthesia Postprocedure Evaluation (Signed)
Anesthesia Post Note  Patient: Monique [name]  Procedure(s) Performed: Procedure(s) (LRB): EXPLORATORY LAPAROTOMY, LYLSIS OF ADHSIONS, EVACATION OF PERITONEAL ABSCESS (N/A) COLOSTOMY CREATION/HARTMANN PROCEDURE (N/A)  Patient location during evaluation: PACU Anesthesia Type: General Level of consciousness: awake and alert Pain management: pain level controlled Vital Signs Assessment: post-procedure vital signs reviewed and stable Respiratory status: spontaneous breathing, nonlabored ventilation, respiratory function stable and patient connected to nasal cannula oxygen Cardiovascular status: blood pressure returned to baseline and stable Postop Assessment: no signs of nausea or vomiting Anesthetic complications: no    Last Vitals:  Vitals:   05/07/16 1700 05/07/16 1719  BP: (!) 149/68 (!) 162/73  Pulse: 88 90  Resp: 14 16  Temp:  36.4 C    Last Pain:  Vitals:   05/07/16 1700  TempSrc:   PainSc: 8                  Shelton SilvasKevin D Rodell Marrs

## 2016-05-08 ENCOUNTER — Encounter (HOSPITAL_COMMUNITY): Payer: Self-pay | Admitting: General Surgery

## 2016-05-08 NOTE — Progress Notes (Signed)
1 Day Post-Op  Subjective: Doing ok,  Pain as expected.  NG functioning  Objective: Vital signs in last 24 hours: Temp:  [97.5 F (36.4 C)-99.2 F (37.3 C)] 98.2 F (36.8 C) (10/06 0533) Pulse Rate:  [85-112] 99 (10/06 0533) Resp:  [11-18] 14 (10/06 0533) BP: (127-183)/(62-93) 165/70 (10/06 0533) SpO2:  [91 %-100 %] 91 % (10/06 0533)   Intake/Output from previous day: 10/05 0701 - 10/06 0700 In: 3855 [I.V.:3800; IV Piggyback:55] Out: 1300 [Urine:1050; Emesis/NG output:100; Blood:150] Intake/Output this shift: No intake/output data recorded.   General appearance: alert and cooperative GI: soft, mildly distended Ostomy: pink, viable Incision: no significant drainage  Lab Results:  No results for input(s): WBC, HGB, HCT, PLT in the last 72 hours. BMET  Recent Labs  05/06/16 0731  NA 139  K 3.9  CL 105  CO2 27  GLUCOSE 124*  BUN 21*  CREATININE 0.79  CALCIUM 8.9   PT/INR No results for input(s): LABPROT, INR in the last 72 hours. ABG No results for input(s): PHART, HCO3 in the last 72 hours.  Invalid input(s): PCO2, PO2  MEDS, Scheduled . Chlorhexidine Gluconate Cloth  6 each Topical Q0600  . enoxaparin (LOVENOX) injection  40 mg Subcutaneous Q24H  . famotidine  20 mg Oral BID  . feeding supplement  1 Container Oral TID BM  . HYDROmorphone   Intravenous Q4H  . lip balm  1 application Topical BID  . mupirocin ointment  1 application Nasal BID  . pantoprazole  40 mg Oral Q1200  . sodium chloride flush  10-40 mL Intracatheter Q12H  . venlafaxine XR  150 mg Oral QHS    Studies/Results: Dg Chest 1 View  Result Date: 05/07/2016 CLINICAL DATA:  Central line placement. EXAM: CHEST 1 VIEW COMPARISON:  None. FINDINGS: Nasogastric tube enters the stomach. Right internal jugular central line has its tip in the SVC at the azygos level. No pneumothorax. Heart size is normal. Mediastinal shadows are normal. There is mild atelectasis or scarring at the lung bases. No  effusions. Chronic degenerative changes affect both shoulders. IMPRESSION: Nasogastric tube in the stomach. Right internal jugular central line in the SVC with its tip at the azygos level. No pneumothorax. Mild basilar atelectasis or scarring. Electronically Signed   By: Paulina FusiMark  Shogry M.D.   On: 05/07/2016 16:08   Dg Abd Portable 1v  Result Date: 05/06/2016 CLINICAL DATA:  Follow-up small bowel obstruction. Upper abdominal discomfort. EXAM: PORTABLE ABDOMEN - 1 VIEW COMPARISON:  05/05/2016 FINDINGS: Continued small bowel distention, slightly improved since prior study in the left abdomen. No free air organomegaly. Bilateral renal stones and gallstones all are noted. IMPRESSION: Continued small bowel obstruction pattern, improving slightly since prior study. Electronically Signed   By: Charlett NoseKevin  Dover M.D.   On: 05/06/2016 10:48    Assessment: s/p Procedure(s): EXPLORATORY LAPAROTOMY, LYLSIS OF ADHSIONS, EVACATION OF PERITONEAL ABSCESS COLOSTOMY CREATION/HARTMANN PROCEDURE Patient Active Problem List   Diagnosis Date Noted  . SBO (small bowel obstruction) 04/26/2016  . Hypertension   . Abscess of peritoneum (HCC) 04/25/2016  . Diverticulitis of sigmoid colon 04/21/2016  . Postop Hypokalemia 07/10/2011  . Primary osteoarthritis of right hip 07/08/2011    Expected post op course  Plan: Cont NG, NPO  Ambulate    LOS: 17 days     .Vanita PandaAlicia C Roslynn Holte, MD [facility] [place] [phone]   05/08/2016 7:37 AM

## 2016-05-08 NOTE — Consult Note (Signed)
WOC Nurse ostomy consult note Stoma type/location: LUQ colostomy Stomal assessment/size: 1 1/2  inches, raised, no mucocutaneous separation. Peristomal assessment: Intact with some slight bruising along lower border of stoma. Treatment options for stomal/peristomal skin: Skin barrier ring Output:  Small amount serosanginous Ostomy pouching: 1pc. From floor stock with skin barrier ring.  Education provided: [employer] Colostomy booklet.  Patient minimally involved in pouch change due to nausea and difficulty viewing stoma.  All of patient questions presented during pouch change answered to her expressed satisfaction. [place] in [employer] [place] program: No  WOC colleagues will follow up next week.  Additional [employer] pouch and ring left at bedside.  Discussed POC with patient and bedside nurse.   Thanks Helmut MusterSherry Juanda Luba MSN, RN, CNS-BC, [employer]

## 2016-05-09 LAB — BASIC METABOLIC PANEL
Anion gap: 4 — ABNORMAL LOW (ref 5–15)
BUN: 9 mg/dL (ref 6–20)
CALCIUM: 7.9 mg/dL — AB (ref 8.9–10.3)
CHLORIDE: 108 mmol/L (ref 101–111)
CO2: 25 mmol/L (ref 22–32)
CREATININE: 0.43 mg/dL — AB (ref 0.44–1.00)
GFR calc non Af Amer: >60 mL/min (ref >60)
Glucose, Bld: 151 mg/dL — ABNORMAL HIGH (ref 65–99)
Potassium: 4 mmol/L (ref 3.5–5.1)
SODIUM: 137 mmol/L (ref 135–145)

## 2016-05-09 LAB — CBC
HCT: 31.2 % — ABNORMAL LOW (ref 36.0–46.0)
HEMOGLOBIN: 10.1 g/dL — AB (ref 12.0–15.0)
MCH: 30.9 pg (ref 26.0–34.0)
MCHC: 32.4 g/dL (ref 30.0–36.0)
MCV: 95.4 fL (ref 78.0–100.0)
PLATELETS: 191 10*3/uL (ref 150–400)
RBC: 3.27 MIL/uL — ABNORMAL LOW (ref 3.87–5.11)
RDW: 14.5 % (ref 11.5–15.5)
WBC: 10.7 10*3/uL — ABNORMAL HIGH (ref 4.0–10.5)

## 2016-05-09 NOTE — Progress Notes (Signed)
2 Days Post-Op Hartman's  Subjective: Doing ok,  Pain better.  NG functioning (output less bilious)  Objective: Vital signs in last 24 hours: Temp:  [98 F (36.7 C)-99.1 F (37.3 C)] 98.8 F (37.1 C) (10/07 0522) Pulse Rate:  [91-103] 103 (10/07 0522) Resp:  [16-18] 16 (10/07 0741) BP: (138-159)/(63-73) 159/73 (10/07 0522) SpO2:  [92 %-98 %] 93 % (10/07 0741) Weight:  [78.3 kg (172 lb 9.6 oz)] 78.3 kg (172 lb 9.6 oz) (10/07 0522)   Intake/Output from previous day: 10/06 0701 - 10/07 0700 In: 2770 [P.O.:360; I.V.:2410] Out: 1350 [Urine:800; Emesis/NG output:550] Intake/Output this shift: No intake/output data recorded.   General appearance: alert and cooperative GI: soft, mildly distended Ostomy: pink, viable Incision: no significant drainage  Lab Results:   Recent Labs  05/09/16 0451  WBC 10.7*  HGB 10.1*  HCT 31.2*  PLT 191   BMET  Recent Labs  05/09/16 0451  NA 137  K 4.0  CL 108  CO2 25  GLUCOSE 151*  BUN 9  CREATININE 0.43*  CALCIUM 7.9*   PT/INR No results for input(s): LABPROT, INR in the last 72 hours. ABG No results for input(s): PHART, HCO3 in the last 72 hours.  Invalid input(s): PCO2, PO2  MEDS, Scheduled . Chlorhexidine Gluconate Cloth  6 each Topical Q0600  . enoxaparin (LOVENOX) injection  40 mg Subcutaneous Q24H  . famotidine  20 mg Oral BID  . feeding supplement  1 Container Oral TID BM  . HYDROmorphone   Intravenous Q4H  . lip balm  1 application Topical BID  . mupirocin ointment  1 application Nasal BID  . sodium chloride flush  10-40 mL Intracatheter Q12H  . venlafaxine XR  150 mg Oral QHS    Studies/Results: Dg Chest 1 View  Result Date: 05/07/2016 CLINICAL DATA:  Central line placement. EXAM: CHEST 1 VIEW COMPARISON:  None. FINDINGS: Nasogastric tube enters the stomach. Right internal jugular central line has its tip in the SVC at the azygos level. No pneumothorax. Heart size is normal. Mediastinal shadows are normal.  There is mild atelectasis or scarring at the lung bases. No effusions. Chronic degenerative changes affect both shoulders. IMPRESSION: Nasogastric tube in the stomach. Right internal jugular central line in the SVC with its tip at the azygos level. No pneumothorax. Mild basilar atelectasis or scarring. Electronically Signed   By: Paulina FusiMark  Shogry M.D.   On: 05/07/2016 16:08    Assessment: s/p Procedure(s): EXPLORATORY LAPAROTOMY, LYLSIS OF ADHSIONS, EVACATION OF PERITONEAL ABSCESS COLOSTOMY CREATION/HARTMANN PROCEDURE Patient Active Problem List   Diagnosis Date Noted  . SBO (small bowel obstruction) 04/26/2016  . Hypertension   . Abscess of peritoneum (HCC) 04/25/2016  . Diverticulitis of sigmoid colon 04/21/2016  . Postop Hypokalemia 07/10/2011  . Primary osteoarthritis of right hip 07/08/2011    Expected post op course  Plan: Cont NG, NPO  D/c foley Ambulate    LOS: 18 days     .Monique PandaAlicia C Starsha Morning, MD [facility], [place] [phone]   05/09/2016 9:50 AM

## 2016-05-10 LAB — BASIC METABOLIC PANEL
ANION GAP: 3 — AB (ref 5–15)
CALCIUM: 8 mg/dL — AB (ref 8.9–10.3)
CO2: 26 mmol/L (ref 22–32)
Chloride: 108 mmol/L (ref 101–111)
Creatinine, Ser: 0.39 mg/dL — ABNORMAL LOW (ref 0.44–1.00)
GFR calc Af Amer: >60 mL/min (ref >60)
GFR calc non Af Amer: >60 mL/min (ref >60)
GLUCOSE: 122 mg/dL — AB (ref 65–99)
Potassium: 3.9 mmol/L (ref 3.5–5.1)
Sodium: 137 mmol/L (ref 135–145)

## 2016-05-10 LAB — CBC
HEMATOCRIT: 29.1 % — AB (ref 36.0–46.0)
Hemoglobin: 9.5 g/dL — ABNORMAL LOW (ref 12.0–15.0)
MCH: 30.9 pg (ref 26.0–34.0)
MCHC: 32.6 g/dL (ref 30.0–36.0)
MCV: 94.8 fL (ref 78.0–100.0)
PLATELETS: 213 10*3/uL (ref 150–400)
RBC: 3.07 MIL/uL — ABNORMAL LOW (ref 3.87–5.11)
RDW: 14.1 % (ref 11.5–15.5)
WBC: 9 10*3/uL (ref 4.0–10.5)

## 2016-05-10 NOTE — Progress Notes (Signed)
3 Days Post-Op [facility]man's  Subjective: Doing ok,  Pain better.  NG functioning (output more bilious today)  Objective: Vital signs in last 24 hours: Temp:  [98.1 F (36.7 C)-98.7 F (37.1 C)] 98.1 F (36.7 C) (10/08 0624) Pulse Rate:  [95-101] 96 (10/08 0624) Resp:  [16-18] 18 (10/08 0624) BP: (149-176)/(67-89) 160/67 (10/08 0624) SpO2:  [94 %-99 %] 98 % (10/08 0624)   Intake/Output from previous day: 10/07 0701 - 10/08 0700 In: 2400 [I.V.:2400] Out: 1400 [Urine:700; Emesis/NG output:650; Stool:50] Intake/Output this shift: No intake/output data recorded.   General appearance: alert and cooperative GI: soft, mildly distended Ostomy: pink, viable Incision: no significant drainage  Lab Results:   Recent Labs  05/09/16 0451 05/10/16 0500  WBC 10.7* 9.0  HGB 10.1* 9.5*  HCT 31.2* 29.1*  PLT 191 213   BMET  Recent Labs  05/09/16 0451 05/10/16 0500  NA 137 137  K 4.0 3.9  CL 108 108  CO2 25 26  GLUCOSE 151* 122*  BUN 9 <5*  CREATININE 0.43* 0.39*  CALCIUM 7.9* 8.0*   PT/INR No results for input(s): LABPROT, INR in the last 72 hours. ABG No results for input(s): PHART, HCO3 in the last 72 hours.  Invalid input(s): PCO2, PO2  MEDS, Scheduled . Chlorhexidine Gluconate Cloth  6 each Topical Q0600  . enoxaparin (LOVENOX) injection  40 mg Subcutaneous Q24H  . famotidine  20 mg Oral BID  . feeding supplement  1 Container Oral TID BM  . HYDROmorphone   Intravenous Q4H  . lip balm  1 application Topical BID  . mupirocin ointment  1 application Nasal BID  . sodium chloride flush  10-40 mL Intracatheter Q12H  . venlafaxine XR  150 mg Oral QHS    Studies/Results: No results found.  Assessment: s/p Procedure(s): EXPLORATORY LAPAROTOMY, LYLSIS OF ADHSIONS, EVACATION OF PERITONEAL ABSCESS COLOSTOMY CREATION/HARTMANN PROCEDURE Patient Active Problem List   Diagnosis Date Noted  . SBO (small bowel obstruction) 04/26/2016  . Hypertension   . Abscess of  peritoneum (HCC) 04/25/2016  . Diverticulitis of sigmoid colon 04/21/2016  . Postop Hypokalemia 07/10/2011  . Primary osteoarthritis of right hip 07/08/2011    Expected post op course  Plan: Cont NG, NPO  Ambulate Await return of bowel function   LOS: 19 days     .Vanita PandaAlicia C Cieanna Stormes, MD [facility], [place] [phone]   05/10/2016 9:14 AM

## 2016-05-11 DIAGNOSIS — E440 Moderate protein-calorie malnutrition: Secondary | ICD-10-CM | POA: Insufficient documentation

## 2016-05-11 NOTE — Progress Notes (Signed)
Nutrition Follow-up  DOCUMENTATION CODES:   Non-severe (moderate) malnutrition in context of acute illness/injury, Obesity unspecified  INTERVENTION:   Recommend initiation of nutrition support post surgery if unable to advance diet. Pt now meets criteria for moderate malnutrition given 4% wt loss x 3 weeks and energy intake <75% x 7 days.  If nutrition support initiated or diet advanced will need to monitor for refeeding syndrome.  Monitor for diet advancement per surgery. RD to continue to monitor for plan.  NUTRITION DIAGNOSIS:   Inadequate oral intake related to nausea, vomiting as evidenced by per patient/family report, meal completion < 25%.  Ongoing.  GOAL:   Patient will meet greater than or equal to 90% of their needs  Not meeting. Has not met for 3 weeks now.  MONITOR:   PO intake, Supplement acceptance, Diet advancement, Labs, Weight trends, I & O's  ASSESSMENT:   75 y.o. female who had a busy weekend but began feeling bad Sunday night [place]  She has had a hip replacement and takes steroid injections per Dr. Primus Bravo in [place] and she last received one 2 weeks ago.  She admits to constipation since she is on chronic pain meds.  She is followed by Dr. Mali Badger and his NP ordered a CT today which showed a perforation/abscess in the right lower quadrant.  Pt currently NPO, has been unable to advance diet past full liquids (last on full liquids 10/4) since admission 3 weeks ago. Would recommend nutrition support given that patient has lost 8 lb since 9/19 (4% wt loss x 2 weeks and energy intake has been <75% of needs for more than 7 days). Pt now meets criteria for moderate malnutrition.   Patient just had NGT removed today. Pt will be at refeeding syndrome risk if diet is advanced or nutrition support is initiated.  Labs reviewed. Medications: D5 and .45% NaCl w/ KCl infusion at 100 ml/hr - provides 408 kcal , Zofran PRN  Diet  Order:  Diet NPO time specified Except for: Sips with Meds, Ice Chips  Skin:  Reviewed, no issues  Last BM:  10/4  Height:   Ht Readings from Last 1 Encounters:  04/21/16 '5\' 5"'  (1.651 m)    Weight:   Wt Readings from Last 1 Encounters:  05/09/16 172 lb 9.6 oz (78.3 kg)    Ideal Body Weight:  57 kg  BMI:  Body mass index is 28.72 kg/m.  Estimated Nutritional Needs:   Kcal:  1800-2000  Protein:  95-105 grams  Fluid:  1.8-2.0 L/day  EDUCATION NEEDS:   No education needs identified at this time  Clayton Bibles, MS, RD, LDN Pager: [phone] After Hours Pager: [phone]

## 2016-05-11 NOTE — Progress Notes (Signed)
4 Days Post-Op Hartman's  Subjective: No major events; had some bloody drainage from incision overnight. Pain well controlled. No Flatus/stool yet.  Objective: Vital signs in last 24 hours: Temp:  [98.3 F (36.8 C)-99.1 F (37.3 C)] 98.9 F (37.2 C) (10/09 0603) Pulse Rate:  [94-105] 103 (10/09 0603) Resp:  [16-18] 18 (10/09 0820) BP: (140-155)/(62-70) 155/70 (10/09 0603) SpO2:  [93 %-97 %] 96 % (10/09 0820) FiO2 (%):  [97 %] 97 % (10/08 1200)   Intake/Output from previous day: 10/08 0701 - 10/09 0700 In: 2650 [P.O.:240; I.V.:2410] Out: 500 [Emesis/NG output:500] Intake/Output this shift: No intake/output data recorded.   General appearance: alert and cooperative GI: soft, mildly distended Ostomy: pink, viable Incision: no significant drainage  Lab Results:   Recent Labs  05/09/16 0451 05/10/16 0500  WBC 10.7* 9.0  HGB 10.1* 9.5*  HCT 31.2* 29.1*  PLT 191 213   BMET  Recent Labs  05/09/16 0451 05/10/16 0500  NA 137 137  K 4.0 3.9  CL 108 108  CO2 25 26  GLUCOSE 151* 122*  BUN 9 <5*  CREATININE 0.43* 0.39*  CALCIUM 7.9* 8.0*   PT/INR No results for input(s): LABPROT, INR in the last 72 hours. ABG No results for input(s): PHART, HCO3 in the last 72 hours.  Invalid input(s): PCO2, PO2  MEDS, Scheduled . enoxaparin (LOVENOX) injection  40 mg Subcutaneous Q24H  . famotidine  20 mg Oral BID  . feeding supplement  1 Container Oral TID BM  . HYDROmorphone   Intravenous Q4H  . lip balm  1 application Topical BID  . mupirocin ointment  1 application Nasal BID  . sodium chloride flush  10-40 mL Intracatheter Q12H  . venlafaxine XR  150 mg Oral QHS    Studies/Results: No results found.  Assessment: s/p Procedure(s): EXPLORATORY LAPAROTOMY, LYLSIS OF ADHSIONS, EVACATION OF PERITONEAL ABSCESS COLOSTOMY CREATION/HARTMANN PROCEDURE Patient Active Problem List   Diagnosis Date Noted  . SBO (small bowel obstruction) 04/26/2016  . Hypertension   .  Abscess of peritoneum (HCC) 04/25/2016  . Diverticulitis of sigmoid colon 04/21/2016  . Postop Hypokalemia 07/10/2011  . Primary osteoarthritis of right hip 07/08/2011    Expected post op course  Plan: Ambulate Await return of bowel function, remove NG today (500 out but 240 PO) but keep NPO except sips/chips.    LOS: 20 days     Berna Buehelsea A Connor, MD [facility], [place] [phone]   05/11/2016 9:00 AM

## 2016-05-12 NOTE — Progress Notes (Signed)
5 Days Post-Op [facility]man's  Subjective: No major events; had some incisional pain while walking. Pain well controlled. No Flatus/stool yet. No nausea or emesis without Ng, tolerating sips.  Objective: Vital signs in last 24 hours: Temp:  [98 F (36.7 C)-99.1 F (37.3 C)] 98.6 F (37 C) (10/10 1059) Pulse Rate:  [91-104] 91 (10/10 1059) Resp:  [14-18] 14 (10/10 1059) BP: (120-137)/(54-69) 135/59 (10/10 1059) SpO2:  [95 %-99 %] 98 % (10/10 1059)   Intake/Output from previous day: 10/09 0701 - 10/10 0700 In: 2520 [P.O.:120; I.V.:2400] Out: 2700 [Urine:2700] Intake/Output this shift: Total I/O In: 60 [P.O.:60] Out: 300 [Urine:300]   General appearance: alert and cooperative GI: soft, mildly distended Ostomy: pink, viable Incision: no significant drainage  Lab Results:   Recent Labs  05/10/16 0500  WBC 9.0  HGB 9.5*  HCT 29.1*  PLT 213   BMET  Recent Labs  05/10/16 0500  NA 137  K 3.9  CL 108  CO2 26  GLUCOSE 122*  BUN <5*  CREATININE 0.39*  CALCIUM 8.0*   PT/INR No results for input(s): LABPROT, INR in the last 72 hours. ABG No results for input(s): PHART, HCO3 in the last 72 hours.  Invalid input(s): PCO2, PO2  MEDS, Scheduled . enoxaparin (LOVENOX) injection  40 mg Subcutaneous Q24H  . famotidine  20 mg Oral BID  . feeding supplement  1 Container Oral TID BM  . HYDROmorphone   Intravenous Q4H  . lip balm  1 application Topical BID  . sodium chloride flush  10-40 mL Intracatheter Q12H  . venlafaxine XR  150 mg Oral QHS    Studies/Results: No results found.  Assessment: s/p Procedure(s): EXPLORATORY LAPAROTOMY, LYLSIS OF ADHSIONS, EVACATION OF PERITONEAL ABSCESS COLOSTOMY CREATION/HARTMANN PROCEDURE Patient Active Problem List   Diagnosis Date Noted  . Malnutrition of moderate degree 05/11/2016  . SBO (small bowel obstruction) 04/26/2016  . Hypertension   . Abscess of peritoneum (HCC) 04/25/2016  . Diverticulitis of sigmoid colon  04/21/2016  . Postop Hypokalemia 07/10/2011  . Primary osteoarthritis of right hip 07/08/2011    Expected post op course  Plan: Ambulate Await return of bowel function, keep NPO except sips/chips.    LOS: 21 days     Berna Buehelsea A Malana Eberwein, MD [facility], [place] [phone]   05/12/2016 12:39 PM

## 2016-05-12 NOTE — Care Management Important Message (Signed)
Important Message  Patient Details IM Letter given to Suzanne/Case Manager to present to Patient Name: Monique [name] MRN: [id number] Date of Birth: 04/15/1941   Medicare Important Message Given:  Yes    Haskell FlirtJamison, Vijay Durflinger 05/12/2016, 10:17 AMImportant Message  Patient Details  Name: Monique [name] MRN: [id number] Date of Birth: 10/04/1940   Medicare Important Message Given:  Yes    Haskell FlirtJamison, Von Quintanar 05/12/2016, 10:17 AM

## 2016-05-13 NOTE — Progress Notes (Signed)
Patient ID: Monique [name], female   DOB: 1941/04/02, 75 y.o.   MRN: [id number]  [facility] Surgery Progress Note  6 Days Post-Op  Subjective: Feeling well this morning, still distended. States that she had a BM yesterday and felt less distended at the time, but this was not recorded and nurse did not get report of this. No stool or air in bag this morning. No n/v without NG tube.  Objective: Vital signs in last 24 hours: Temp:  [98 F (36.7 C)-98.8 F (37.1 C)] 98 F (36.7 C) (10/11 0613) Pulse Rate:  [85-93] 85 (10/11 0613) Resp:  [14-18] 15 (10/11 0613) BP: (131-143)/(57-73) 138/62 (10/11 0613) SpO2:  [95 %-100 %] 96 % (10/11 0613) FiO2 (%):  [35 %-39 %] 39 % (10/11 0427) Last BM Date: 05/06/16  Intake/Output from previous day: 10/10 0701 - 10/11 0700 In: 2153.3 [P.O.:180; I.V.:1973.3] Out: 2100 [Urine:2100] Intake/Output this shift: Total I/O In: -  Out: 500 [Urine:500]  PE: Gen:  Alert, NAD, pleasant Pulm:  Effort normal Abd: Soft, distended, present but hypoactive BS, incisions C/D/I with staples and wicks in place, ostomy pink with no output or air in bag  Lab Results:  No results for input(s): WBC, HGB, HCT, PLT in the last 72 hours. BMET No results for input(s): NA, K, CL, CO2, GLUCOSE, BUN, CREATININE, CALCIUM in the last 72 hours. PT/INR No results for input(s): LABPROT, INR in the last 72 hours. CMP     Component Value Date/Time   NA 137 05/10/2016 0500   K 3.9 05/10/2016 0500   CL 108 05/10/2016 0500   CO2 26 05/10/2016 0500   GLUCOSE 122 (H) 05/10/2016 0500   BUN <5 (L) 05/10/2016 0500   CREATININE 0.39 (L) 05/10/2016 0500   CALCIUM 8.0 (L) 05/10/2016 0500   PROT 6.3 (L) 05/06/2016 0731   ALBUMIN 3.5 05/06/2016 0731   AST 18 05/06/2016 0731   ALT 29 05/06/2016 0731   ALKPHOS 68 05/06/2016 0731   BILITOT 0.8 05/06/2016 0731   GFRNONAA >60 05/10/2016 0500   GFRAA >60 05/10/2016 0500   Lipase     Component Value Date/Time   LIPASE 19  04/21/2016 1735       Studies/Results: No results found.  Anti-infectives: Anti-infectives    Start     Dose/Rate Route Frequency Ordered Stop   05/07/16 0930  cefoTEtan (CEFOTAN) 2 g in dextrose 5 % 50 mL IVPB     2 g 100 mL/hr over 30 Minutes Intravenous On call to O.R. 05/06/16 1635 05/07/16 1240   05/04/16 1100  amoxicillin-clavulanate (AUGMENTIN) 875-125 MG per tablet 1 tablet  Status:  Discontinued     1 tablet Oral Every 12 hours 05/04/16 1015 05/06/16 1635   04/21/16 2015  piperacillin-tazobactam (ZOSYN) IVPB 3.375 g  Status:  Discontinued     3.375 g 12.5 mL/hr over 240 Minutes Intravenous Every 8 hours 04/21/16 2014 05/04/16 1015   04/21/16 1700  ciprofloxacin (CIPRO) IVPB 400 mg     400 mg 200 mL/hr over 60 Minutes Intravenous  Once 04/21/16 1652 04/21/16 1952   04/21/16 1700  metroNIDAZOLE (FLAGYL) IVPB 500 mg     500 mg 100 mL/hr over 60 Minutes Intravenous  Once 04/21/16 1652 04/21/16 1834   04/21/16 1645  clindamycin (CLEOCIN) IVPB 300 mg  Status:  Discontinued     300 mg 100 mL/hr over 30 Minutes Intravenous  Once 04/21/16 1643 04/21/16 1651   04/21/16 1645  clindamycin (CLEOCIN) IVPB 600 mg  Status:  Discontinued     600 mg 100 mL/hr over 30 Minutes Intravenous  Once 04/21/16 1644 04/21/16 1651       Assessment/Plan EXPLORATORY LAPAROTOMY, LYLSIS OF ADHSIONS, 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 Maisie Fushomas - POD 6 - patient states that she had a BM/flatus in ostomy bag yesterday and it was changed, but no flatus or BM this morning and she is still distended with hypoactive BS.  FEN - NPO VTE - SCDs, lovenox  Plan - possible BM yesterday (RN investigating) but patient still distended with hypoactive BS. Continue NPO except sips clears (water,broth...)Gustavus Bryant/ice chips. May need TPN tomorrow if ileus persists. Mobilize. Will consult PT. Wicks removed, staples to remain in place, daily dry dressing changes. Labs in AM. Once  no longer NPO will switch back from pepcid to protonix.   LOS: 22 days    Edson SnowballBROOKE A MILLER , [facility] 05/13/2016, 9:06 AM Pager: [phone] Consults: [phone] Mon-Fri 7:00 am-4:30 pm Sat-Sun 7:00 am-11:30 am

## 2016-05-13 NOTE — Evaluation (Signed)
Physical Therapy Evaluation Patient Details Name: Monique [name] MRN: [id number] DOB: 1941/07/01 Today's Date: 05/13/2016   History of Present Illness  75 yo female s/p exp lap, Hartmann's, evacuation of peritoneal abscess, colostomy 05/07/16.   Clinical Impression  On eval, pt required Min guard-Min assist for mobility. She walked ~500 feet. Pt reports she already walked x 4 today. Encouraged pt to ambulate on her own as she is able. Spoke with RN who was okay with this. Will follow pt and progress activity as tolerated.     Follow Up Recommendations No PT follow up    Equipment Recommendations  None recommended by PT    Recommendations for Other Services       Precautions / Restrictions Precautions Precautions: Fall Precaution Comments: abd surgery Restrictions Weight Bearing Restrictions: No      Mobility  Bed Mobility Overal bed mobility: Needs Assistance Bed Mobility: Rolling;Sidelying to Sit;Sit to Sidelying Rolling: Min guard Sidelying to sit: Min guard     Sit to sidelying: Min assist General bed mobility comments: Increased time. VCs safety, technique. Small amount of assist for LEs onto bed.   Transfers Overall transfer level: Needs assistance   Transfers: Sit to/from Stand Sit to Stand: Min guard         General transfer comment: close guard for safety  Ambulation/Gait Ambulation/Gait assistance: Min guard Ambulation Distance (Feet): 500 Feet Assistive device:  (IV pole) Gait Pattern/deviations: Step-through pattern;Decreased stride length     General Gait Details: close guard for safety. Slightly unsteady. Fluctuating O2 sats (85-94%) but pt toleratd distance well.  Stairs            Wheelchair Mobility    Modified Rankin (Stroke Patients Only)       Balance                                             Pertinent Vitals/Pain Pain Assessment: Faces Faces Pain Scale: Hurts even more Pain Location:  abdomen Pain Descriptors / Indicators: Sore Pain Intervention(s): Monitored during session    Home Living Family/patient expects to be discharged to:: Private residence Living Arrangements: Spouse/significant other   Type of Home: House Home Access: Ramped entrance     Home Layout: One level Home Equipment: Environmental consultant - 2 wheels      Prior Function Level of Independence: Independent               Hand Dominance        Extremity/Trunk Assessment   Upper Extremity Assessment: Generalized weakness           Lower Extremity Assessment: Generalized weakness      Cervical / Trunk Assessment: Normal  Communication   Communication: No difficulties  Cognition Arousal/Alertness: Awake/alert Behavior During Therapy: WFL for tasks assessed/performed Overall Cognitive Status: Within Functional Limits for tasks assessed                      General Comments      Exercises     Assessment/Plan    PT Assessment Patient needs continued PT services  PT Problem List Decreased mobility;Decreased activity tolerance;Decreased balance;Pain          PT Treatment Interventions Gait training;Functional mobility training;Therapeutic activities;Therapeutic exercise;Patient/family education    PT Goals (Current goals can be found in the Care Plan section)  Acute Rehab PT Goals Patient Stated  Goal: less pain. regain PLOF PT Goal Formulation: With patient Time For Goal Achievement: 05/27/16 Potential to Achieve Goals: Good    Frequency Min 3X/week   Barriers to discharge        Co-evaluation               End of Session   Activity Tolerance: Patient tolerated treatment well Patient left: in bed;with call bell/phone within reach           Time: 1504-1530 PT Time Calculation (min) (ACUTE ONLY): 26 min   Charges:   PT Evaluation $PT Eval Low Complexity: 1 Procedure PT Treatments $Gait Training: 8-22 mins   PT G Codes:        Rebeca AlertJannie Rowin Bayron,  MPT Pager: [phone]

## 2016-05-13 NOTE — Progress Notes (Signed)
Discharge planning, spoke with patient at beside. [facility]. Patient states her husband has 24 hr care at home that is private pay. She is unable to remember the name of the agency. She states that [facility] when she goes home. [phone]

## 2016-05-14 LAB — BASIC METABOLIC PANEL
Anion gap: 5 (ref 5–15)
CHLORIDE: 109 mmol/L (ref 101–111)
CO2: 28 mmol/L (ref 22–32)
CREATININE: 0.41 mg/dL — AB (ref 0.44–1.00)
Calcium: 8.4 mg/dL — ABNORMAL LOW (ref 8.9–10.3)
Glucose, Bld: 104 mg/dL — ABNORMAL HIGH (ref 65–99)
POTASSIUM: 3.8 mmol/L (ref 3.5–5.1)
SODIUM: 142 mmol/L (ref 135–145)

## 2016-05-14 LAB — CBC
HCT: 26.8 % — ABNORMAL LOW (ref 36.0–46.0)
HEMOGLOBIN: 8.7 g/dL — AB (ref 12.0–15.0)
MCH: 29.7 pg (ref 26.0–34.0)
MCHC: 32.5 g/dL (ref 30.0–36.0)
MCV: 91.5 fL (ref 78.0–100.0)
PLATELETS: 297 10*3/uL (ref 150–400)
RBC: 2.93 MIL/uL — AB (ref 3.87–5.11)
RDW: 14.3 % (ref 11.5–15.5)
WBC: 5.4 10*3/uL (ref 4.0–10.5)

## 2016-05-14 NOTE — Progress Notes (Signed)
Patient ID: Monique [name], female   DOB: 07/29/1941, 75 y.o.   MRN: [id number]  [facility]  7 Days Post-Op  Subjective: Has had some intermittent nausea but no vomiting. Tolerating sips of clears. Feels gas in her abdomen but no flatus or stool in ostomy bag. Ambulated much more yesterday. Noticed some swelling in her BLE with prolonged standing or sitting in chair without feet elevated.  Objective: Vital signs in last 24 hours: Temp:  [97.6 F (36.4 C)-98.7 F (37.1 C)] 98.5 F (36.9 C) (10/12 0431) Pulse Rate:  [87-104] 104 (10/12 0431) Resp:  [12-20] 16 (10/12 0800) BP: (124-151)/(65-83) 151/83 (10/12 0431) SpO2:  [93 %-99 %] 99 % (10/12 0800) FiO2 (%):  [36 %-39 %] 39 % (10/12 0426) Weight:  [172 lb 14.4 oz (78.4 kg)] 172 lb 14.4 oz (78.4 kg) (10/11 2047) Last BM Date: 05/06/16  Intake/Output from previous day: 10/11 0701 - 10/12 0700 In: 1806.2 [P.O.:60; I.V.:1746.2] Out: 1325 [Urine:1325] Intake/Output this shift: No intake/output data recorded.  PE: Gen:  Alert, NAD, pleasant Pulm:  Effort normal Abd: Soft, distended, present but hypoactive BS, incisions C/D/I with staples intact and mild surrounding erythema, ostomy pink with no output or air in bag  Lab Results:   Recent Labs  05/14/16 0330  WBC 5.4  HGB 8.7*  HCT 26.8*  PLT 297   BMET  Recent Labs  05/14/16 0330  NA 142  K 3.8  CL 109  CO2 28  GLUCOSE 104*  BUN <5*  CREATININE 0.41*  CALCIUM 8.4*   PT/INR No results for input(s): LABPROT, INR in the last 72 hours. CMP     Component Value Date/Time   NA 142 05/14/2016 0330   K 3.8 05/14/2016 0330   CL 109 05/14/2016 0330   CO2 28 05/14/2016 0330   GLUCOSE 104 (H) 05/14/2016 0330   BUN <5 (L) 05/14/2016 0330   CREATININE 0.41 (L) 05/14/2016 0330   CALCIUM 8.4 (L) 05/14/2016 0330   PROT 6.3 (L) 05/06/2016 0731   ALBUMIN 3.5 05/06/2016 0731   AST 18 05/06/2016 0731   ALT 29 05/06/2016 0731   ALKPHOS 68  05/06/2016 0731   BILITOT 0.8 05/06/2016 0731   GFRNONAA >60 05/14/2016 0330   GFRAA >60 05/14/2016 0330   Lipase     Component Value Date/Time   LIPASE 19 04/21/2016 1735       Studies/Results: No results found.  Anti-infectives: Anti-infectives    Start     Dose/Rate Route Frequency Ordered Stop   05/07/16 0930  cefoTEtan (CEFOTAN) 2 g in dextrose 5 % 50 mL IVPB     2 g 100 mL/hr over 30 Minutes Intravenous On call to O.R. 05/06/16 1635 05/07/16 1240   05/04/16 1100  amoxicillin-clavulanate (AUGMENTIN) 875-125 MG per tablet 1 tablet  Status:  Discontinued     1 tablet Oral Every 12 hours 05/04/16 1015 05/06/16 1635   04/21/16 2015  piperacillin-tazobactam (ZOSYN) IVPB 3.375 g  Status:  Discontinued     3.375 g 12.5 mL/hr over 240 Minutes Intravenous Every 8 hours 04/21/16 2014 05/04/16 1015   04/21/16 1700  ciprofloxacin (CIPRO) IVPB 400 mg     400 mg 200 mL/hr over 60 Minutes Intravenous  Once 04/21/16 1652 04/21/16 1952   04/21/16 1700  metroNIDAZOLE (FLAGYL) IVPB 500 mg     500 mg 100 mL/hr over 60 Minutes Intravenous  Once 04/21/16 1652 04/21/16 1834   04/21/16 1645  clindamycin (CLEOCIN) IVPB 300 mg  Status:  Discontinued     300 mg 100 mL/hr over 30 Minutes Intravenous  Once 04/21/16 1643 04/21/16 1651   04/21/16 1645  clindamycin (CLEOCIN) IVPB 600 mg  Status:  Discontinued     600 mg 100 mL/hr over 30 Minutes Intravenous  Once 04/21/16 1644 04/21/16 1651       Assessment/Plan EXPLORATORY LAPAROTOMY, LYLSIS OF ADHSIONS, 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 Maisie Fus - POD 7 - no stool or flatus in ostomy bag Chronic pain - takes percocet at home. Will keep on PCA until able to tolerate PO meds then will wean back to home pain medications. Patient ok with this plan.  FEN - Clears VTE - SCDs, lovenox  Plan - Trial advancing to clears. Decrease IVF. Mobilize, PT.    LOS: 23 days    Edson Snowball ,  [facility] Surgery 05/14/2016, 9:51 AM Pager: [phone] Consults: [phone] Mon-Fri 7:00 am-4:30 pm Sat-Sun 7:00 am-11:30 am

## 2016-05-15 ENCOUNTER — Inpatient Hospital Stay (HOSPITAL_COMMUNITY): Payer: Medicare Other

## 2016-05-15 LAB — MAGNESIUM: Magnesium: 1.7 mg/dL (ref 1.7–2.4)

## 2016-05-15 LAB — PHOSPHORUS: Phosphorus: 3.7 mg/dL (ref 2.5–4.6)

## 2016-05-15 MED ORDER — LORATADINE 10 MG PO TABS
10.0000 mg | ORAL_TABLET | Freq: Every day | ORAL | Status: DC
  Administered 2016-05-15 – 2016-05-19 (×5): 10 mg via ORAL
  Filled 2016-05-15 (×5): qty 1

## 2016-05-15 MED ORDER — OXYCODONE HCL 5 MG PO TABS
5.0000 mg | ORAL_TABLET | ORAL | Status: DC | PRN
  Administered 2016-05-15 – 2016-05-19 (×19): 10 mg via ORAL
  Filled 2016-05-15 (×19): qty 2

## 2016-05-15 MED ORDER — DIPHENHYDRAMINE HCL 50 MG/ML IJ SOLN: 12.5000 mg | Freq: Four times a day (QID) | INTRAMUSCULAR | Status: DC | PRN

## 2016-05-15 MED ORDER — VITAMIN C 500 MG PO TABS
500.0000 mg | ORAL_TABLET | Freq: Two times a day (BID) | ORAL | Status: DC
  Administered 2016-05-15 – 2016-05-19 (×9): 500 mg via ORAL
  Filled 2016-05-15 (×9): qty 1

## 2016-05-15 MED ORDER — ACETAMINOPHEN 500 MG PO TABS
1000.0000 mg | ORAL_TABLET | Freq: Three times a day (TID) | ORAL | Status: DC
  Administered 2016-05-15 – 2016-05-19 (×13): 1000 mg via ORAL
  Filled 2016-05-15 (×13): qty 2

## 2016-05-15 MED ORDER — SODIUM CHLORIDE 0.9% FLUSH: 3.0000 mL | INTRAVENOUS | Status: DC | PRN

## 2016-05-15 MED ORDER — PHENOL 1.4 % MT LIQD: 2.0000 | OROMUCOSAL | Status: DC | PRN

## 2016-05-15 MED ORDER — SODIUM CHLORIDE 0.9 % IV SOLN: 250.0000 mL | INTRAVENOUS | Status: DC | PRN

## 2016-05-15 MED ORDER — HYDROMORPHONE HCL 1 MG/ML IJ SOLN: 1.0000 mg | INTRAMUSCULAR | Status: DC | PRN

## 2016-05-15 MED ORDER — LISINOPRIL 20 MG PO TABS
20.0000 mg | ORAL_TABLET | Freq: Every day | ORAL | Status: DC
  Administered 2016-05-15 – 2016-05-19 (×5): 20 mg via ORAL
  Filled 2016-05-15 (×5): qty 1

## 2016-05-15 MED ORDER — IOPAMIDOL (ISOVUE-300) INJECTION 61%
100.0000 mL | Freq: Once | INTRAVENOUS | Status: AC | PRN
  Administered 2016-05-15: 100 mL via INTRAVENOUS

## 2016-05-15 MED ORDER — LACTATED RINGERS IV BOLUS (SEPSIS): 1000.0000 mL | Freq: Three times a day (TID) | INTRAVENOUS | Status: AC | PRN

## 2016-05-15 MED ORDER — ENSURE ENLIVE PO LIQD
237.0000 mL | Freq: Two times a day (BID) | ORAL | Status: DC
  Administered 2016-05-15 – 2016-05-16 (×2): 237 mL via ORAL

## 2016-05-15 MED ORDER — POLYETHYLENE GLYCOL 3350 17 G PO PACK
17.0000 g | PACK | Freq: Every day | ORAL | Status: DC
  Administered 2016-05-15 – 2016-05-16 (×2): 17 g via ORAL
  Filled 2016-05-15 (×2): qty 1

## 2016-05-15 MED ORDER — SODIUM CHLORIDE 0.9% FLUSH
3.0000 mL | Freq: Two times a day (BID) | INTRAVENOUS | Status: DC
  Administered 2016-05-15 – 2016-05-16 (×3): 3 mL via INTRAVENOUS

## 2016-05-15 MED ORDER — SIMVASTATIN 20 MG PO TABS
40.0000 mg | ORAL_TABLET | Freq: Every day | ORAL | Status: DC
  Administered 2016-05-15 – 2016-05-18 (×4): 40 mg via ORAL
  Filled 2016-05-15 (×5): qty 2

## 2016-05-15 MED ORDER — PRO-STAT SUGAR FREE PO LIQD
30.0000 mL | Freq: Three times a day (TID) | ORAL | Status: DC
  Administered 2016-05-15 (×2): 30 mL via ORAL
  Filled 2016-05-15 (×4): qty 30

## 2016-05-15 MED ORDER — ENSURE ENLIVE PO LIQD: 237.0000 mL | Freq: Two times a day (BID) | ORAL | Status: DC

## 2016-05-15 MED ORDER — PROCHLORPERAZINE EDISYLATE 5 MG/ML IJ SOLN: 5.0000 mg | INTRAMUSCULAR | Status: DC | PRN

## 2016-05-15 MED ORDER — OXYCODONE HCL 5 MG PO TABS: 5.0000 mg | ORAL_TABLET | ORAL | Status: DC | PRN

## 2016-05-15 MED ORDER — ASPIRIN EC 81 MG PO TBEC
81.0000 mg | DELAYED_RELEASE_TABLET | Freq: Every day | ORAL | Status: DC
  Administered 2016-05-15 – 2016-05-19 (×5): 81 mg via ORAL
  Filled 2016-05-15 (×5): qty 1

## 2016-05-15 NOTE — Progress Notes (Signed)
Physical Therapy Treatment Patient Details Name: Priscille Kluvernn T Peloso MRN: [id number] DOB: 07/14/1941 Today's Date: 05/15/2016    History of Present Illness 75 yo female s/p exp lap, Hartmann's, evacuation of peritoneal abscess, colostomy 05/07/16.     PT Comments    Pt ambulated in hallway and was assisted back to chair to prep for CT scan. Pt reported feeling no significant loss of strength or balance deficits since admission to [facility]. Pt felt agreeable to d/c from acute PT and reported that she would continue ambulating with nursing and family in hallway.  Follow Up Recommendations  No PT follow up     Equipment Recommendations  None recommended by PT    Recommendations for Other Services       Precautions / Restrictions Precautions Precautions: Fall Restrictions Weight Bearing Restrictions: No    Mobility  Bed Mobility               General bed mobility comments: pt in chair upon arrival  Transfers Overall transfer level: Needs assistance Equipment used: None Transfers: Sit to/from Stand Sit to Stand: Min guard         General transfer comment: guarding for safety; no verbal cues required for safe technique  Ambulation/Gait Ambulation/Gait assistance: Min guard Ambulation Distance (Feet): 500 Feet Assistive device: None Gait Pattern/deviations: Step-through pattern;Decreased stride length     General Gait Details: guarding for safety; pt reported weakness in hip with increased gait distance, recommended that she take her cane for long distances when discharged; SpO2 remained between 94-100% during gait; no unsteadiness or LOB episodes, slight hip drop 2 times throughout gait due to pt reported hip weakness   Stairs            Wheelchair Mobility    Modified Rankin (Stroke Patients Only)       Balance                                    Cognition Arousal/Alertness: Awake/alert Behavior During Therapy: WFL for tasks  assessed/performed Overall Cognitive Status: Within Functional Limits for tasks assessed                      Exercises      General Comments        Pertinent Vitals/Pain Pain Assessment: 0-10 Pain Score: 6  Pain Location: abdomen Pain Descriptors / Indicators: Cramping Pain Intervention(s): Limited activity within patient's tolerance;Monitored during session    Home Living                      Prior Function            PT Goals (current goals can now be found in the care plan section) Progress towards PT goals: Progressing toward goals    Frequency    Min 3X/week      PT Plan Current plan remains appropriate    Co-evaluation             End of Session   Activity Tolerance: Patient tolerated treatment well Patient left: in chair;with call bell/phone within reach     Time: 1010-1032 PT Time Calculation (min) (ACUTE ONLY): 22 min  Charges:  $Gait Training: 8-22 mins                    G Codes:      Karalee HeightRachel Jalaiya Oyster 05/15/2016, 12:20 PM Karalee Heightachel Keval Nam,  SPT

## 2016-05-15 NOTE — Progress Notes (Signed)
Nutrition Follow-up  DOCUMENTATION CODES:   Non-severe (moderate) malnutrition in context of acute illness/injury, Obesity unspecified  INTERVENTION:  Recommending Parenteral Nutrition per pharmacy - titrate to meet estimated nutrition needs.  Kcal: 1800-2000 Protein: 95-105 grams   Monitor magnesium, potassium, and phosphorus daily for at least 3 days, MD to replete as needed, as pt is at risk for refeeding syndrome given inadequate PO intake x 3 weeks (only NPO, Clear Liquid Diet, or Full Liquid Diet), new Moderate Acute Malnutrition.  Discontinued Boost Breeze.  Added Pro-Stat 30 ml TID, each supplement provides 100 kcal and 15 grams protein. Can mix with juice.   Diet advancement per surgery.  RD will continue to monitor and assess nutrition-related needs.  NUTRITION DIAGNOSIS:   Inadequate oral intake related to nausea, vomiting as evidenced by per patient/family report, meal completion < 25%.  Ongoing.  GOAL:   Patient will meet greater than or equal to 90% of their needs  Not met.  MONITOR:   PO intake, Supplement acceptance, Diet advancement, Labs, Weight trends, I & O's  REASON FOR ASSESSMENT:   LOS, Other (Comment) (Identified during rounds)    ASSESSMENT:   75 y.o. female who had a busy weekend but began feeling bad Sunday night [place].  She has had a hip replacement and takes steroid injections per Dr. Primus Bravo in [place] and she last received one 2 weeks ago.  She admits to constipation since she is on chronic pain meds.  She is followed by Dr. Mali Badger and his NP ordered a CT today which showed a perforation/abscess in the right lower quadrant.  Pt now POD8 s/p exploratory laparotomy, lysis of adhesions, evacuation of peritoneal abscess, colostomy creation. Plan is for CT scan today to evaluate why patient has had lack of output from colostomy. Patient advanced to CLD on 10/12 at 10:50AM. Still no flatus or stool in  colostomy bag.  Patient reports she has a poor appetite and is only able to finish 25-50% of clear liquid trays she has been receiving. She also does not like Boost Breeze and has not been drinking it. Denies nausea/emesis.   Weight trend: 172 lbs on 10/11 (-8 lbs since admission)  Meal Completion: 25-50% per patient. In the past 24 hrs she has had 140 kcal (8% minimum estimated kcal needs) and 1.5 grams protein (2% minimum estimated protein needs).   Medications reviewed and include: famotidine, D5-1/2NS with KCl 20 mEq/L @ 50 ml/hr (60 grams dextrose, 204 kcal daily).  Labs reviewed: Phosphorus and Magnesium WNL today. Chem profile on 10/12: Potassium WNL, BUN <5, Creatinine 0.41, Glucose 104.   Discussed plan with RN. Patient does have central access, just not a PICC. She has CVC Double Lumen Right Internal Jugular placed 10/05. RN recommends Pro-Stat since patient is refusing [employer] - agree this is a good option.  Also discussed plan with PA. Recommended TPN for patient and discussed patient's current access. Pharmacy is okay with providing TPN through current access, but will wait to order pending result of CT in setting of current IV fluid shortage.  Diet Order:  Diet clear liquid Room service appropriate? Yes; Fluid consistency: Thin  Skin:  Reviewed, no issues  Last BM:  10/4  Height:   Ht Readings from Last 1 Encounters:  04/21/16 _0  (1.651 m)    Weight:   Wt Readings from Last 1 Encounters:  05/13/16 172 lb 14.4 oz (78.4 kg)    Ideal Body Weight:  57 kg  BMI:  Body mass index is 28.77 kg/m.  Estimated Nutritional Needs:   Kcal:  1800-2000  Protein:  95-105 grams  Fluid:  1.8-2.0 L/day  EDUCATION NEEDS:   No education needs identified at this time  Willey Blade, MS, RD, LDN Pager: [phone] After Hours Pager: [phone]

## 2016-05-15 NOTE — Progress Notes (Signed)
Patient ID: Monique [name], female   DOB: August 29, 1940, 75 y.o.   MRN: [id number]  [facility] Surgery Progress Note  8 Days Post-Op  Subjective: Feeling ok this morning. Reports some abdominal pain and nausea, but this improves after she drinks fluids. No flatus or stool from colostomy.  Objective: Vital signs in last 24 hours: Temp:  [97.8 F (36.6 C)-98.7 F (37.1 C)] 98.7 F (37.1 C) (10/13 0557) Pulse Rate:  [85-101] 85 (10/13 0557) Resp:  [16-18] 16 (10/13 0557) BP: (128-152)/(63-78) 148/63 (10/13 0557) SpO2:  [96 %-100 %] 96 % (10/13 0557) Last BM Date: 05/06/16  Intake/Output from previous day: 10/12 0701 - 10/13 0700 In: 2039.6 [P.O.:780; I.V.:1259.6] Out: 725 [Urine:725] Intake/Output this shift: Total I/O In: -  Out: 300 [Urine:300]  PE: Gen: Alert, NAD, pleasant Pulm: Effort normal Abd: Soft, distended, hypoactive BS, incisions C/D/I with staples intact and mild surrounding erythema but no purulent drainage, ostomy pink with no output or air in bag   Lab Results:   Recent Labs  05/14/16 0330  WBC 5.4  HGB 8.7*  HCT 26.8*  PLT 297   BMET  Recent Labs  05/14/16 0330  NA 142  K 3.8  CL 109  CO2 28  GLUCOSE 104*  BUN <5*  CREATININE 0.41*  CALCIUM 8.4*   PT/INR No results for input(s): LABPROT, INR in the last 72 hours. CMP     Component Value Date/Time   NA 142 05/14/2016 0330   K 3.8 05/14/2016 0330   CL 109 05/14/2016 0330   CO2 28 05/14/2016 0330   GLUCOSE 104 (H) 05/14/2016 0330   BUN <5 (L) 05/14/2016 0330   CREATININE 0.41 (L) 05/14/2016 0330   CALCIUM 8.4 (L) 05/14/2016 0330   PROT 6.3 (L) 05/06/2016 0731   ALBUMIN 3.5 05/06/2016 0731   AST 18 05/06/2016 0731   ALT 29 05/06/2016 0731   ALKPHOS 68 05/06/2016 0731   BILITOT 0.8 05/06/2016 0731   GFRNONAA >60 05/14/2016 0330   GFRAA >60 05/14/2016 0330   Lipase     Component Value Date/Time   LIPASE 19 04/21/2016 1735       Studies/Results: No results  found.  Anti-infectives: Anti-infectives    Start     Dose/Rate Route Frequency Ordered Stop   05/07/16 0930  cefoTEtan (CEFOTAN) 2 g in dextrose 5 % 50 mL IVPB     2 g 100 mL/hr over 30 Minutes Intravenous On call to O.R. 05/06/16 1635 05/07/16 1240   05/04/16 1100  amoxicillin-clavulanate (AUGMENTIN) 875-125 MG per tablet 1 tablet  Status:  Discontinued     1 tablet Oral Every 12 hours 05/04/16 1015 05/06/16 1635   04/21/16 2015  piperacillin-tazobactam (ZOSYN) IVPB 3.375 g  Status:  Discontinued     3.375 g 12.5 mL/hr over 240 Minutes Intravenous Every 8 hours 04/21/16 2014 05/04/16 1015   04/21/16 1700  ciprofloxacin (CIPRO) IVPB 400 mg     400 mg 200 mL/hr over 60 Minutes Intravenous  Once 04/21/16 1652 04/21/16 1952   04/21/16 1700  metroNIDAZOLE (FLAGYL) IVPB 500 mg     500 mg 100 mL/hr over 60 Minutes Intravenous  Once 04/21/16 1652 04/21/16 1834   04/21/16 1645  clindamycin (CLEOCIN) IVPB 300 mg  Status:  Discontinued     300 mg 100 mL/hr over 30 Minutes Intravenous  Once 04/21/16 1643 04/21/16 1651   04/21/16 1645  clindamycin (CLEOCIN) IVPB 600 mg  Status:  Discontinued     600 mg 100  mL/hr over 30 Minutes Intravenous  Once 04/21/16 1644 04/21/16 1651       Assessment/Plan EXPLORATORY LAPAROTOMY, LYLSIS OF ADHSIONS, EVACATION OF PERITONEAL ABSCESS COLOSTOMY CREATION/HARTMANN PROCEDURE10/5/17 Dr. Maisie Fushomas - POD 8 - no stool or flatus in ostomy bag Chronic pain - takes percocet at home. Will keep on PCA until able to tolerate PO meds then will wean back to home pain medications. Patient ok with this plan.  FEN - Clears VTE - SCDs, lovenox  Plan - CT scan to eval lack of output from colostomy at 8 days postop. Mobilize, PT. Keep PICC for now in case TPN is needed.    LOS: 24 days    Edson SnowballBROOKE A MILLER , [facility] 05/15/2016, 8:04 AM Pager: [phone] Consults: [phone] Mon-Fri 7:00 am-4:30 pm Sat-Sun 7:00 am-11:30 am

## 2016-05-15 NOTE — Progress Notes (Signed)
Patient ID: Monique [name], female   DOB: 09/13/1940, 75 y.o.   MRN: [id number]  Reviewed CT scan with Dr. Michaell CowingGross and patient. No full obstruction or concern for abscess. Will advance diet to full liquids, start miralax, continue ambulation, d/c PCA and start weaning back towards home pain medications. Will wait on TPN and add Ensure for now.  [name] A MILLER

## 2016-05-16 LAB — CBC
HCT: 27.1 % — ABNORMAL LOW (ref 36.0–46.0)
Hemoglobin: 8.9 g/dL — ABNORMAL LOW (ref 12.0–15.0)
MCH: 30.1 pg (ref 26.0–34.0)
MCHC: 32.8 g/dL (ref 30.0–36.0)
MCV: 91.6 fL (ref 78.0–100.0)
PLATELETS: 291 10*3/uL (ref 150–400)
RBC: 2.96 MIL/uL — AB (ref 3.87–5.11)
RDW: 14.3 % (ref 11.5–15.5)
WBC: 5 10*3/uL (ref 4.0–10.5)

## 2016-05-16 LAB — BASIC METABOLIC PANEL
Anion gap: 6 (ref 5–15)
BUN: 7 mg/dL (ref 6–20)
CO2: 28 mmol/L (ref 22–32)
CREATININE: 0.4 mg/dL — AB (ref 0.44–1.00)
Calcium: 8.4 mg/dL — ABNORMAL LOW (ref 8.9–10.3)
Chloride: 108 mmol/L (ref 101–111)
Glucose, Bld: 92 mg/dL (ref 65–99)
POTASSIUM: 3.7 mmol/L (ref 3.5–5.1)
SODIUM: 142 mmol/L (ref 135–145)

## 2016-05-16 NOTE — Progress Notes (Signed)
9 Days Post-Op  Subjective: No complaints. No output from ostomy yet  Objective: Vital signs in last 24 hours: Temp:  [97.7 F (36.5 C)-98.2 F (36.8 C)] 97.9 F (36.6 C) (10/14 0626) Pulse Rate:  [76-101] 101 (10/14 0626) Resp:  [16-18] 18 (10/14 0626) BP: (119-162)/(50-77) 120/50 (10/14 0626) SpO2:  [99 %-100 %] 100 % (10/14 0626) Last BM Date: 05/06/16  Intake/Output from previous day: 10/13 0701 - 10/14 0700 In: 620 [P.O.:600; I.V.:20] Out: 2200 [Urine:2200] Intake/Output this shift: No intake/output data recorded.  Resp: clear to auscultation bilaterally Cardio: regular rate and rhythm GI: soft, minimal tenderness. ostomy pink but no output yet  Lab Results:   Recent Labs  05/14/16 0330 05/16/16 0427  WBC 5.4 5.0  HGB 8.7* 8.9*  HCT 26.8* 27.1*  PLT 297 291   BMET  Recent Labs  05/14/16 0330 05/16/16 0427  NA 142 142  K 3.8 3.7  CL 109 108  CO2 28 28  GLUCOSE 104* 92  BUN <5* 7  CREATININE 0.41* 0.40*  CALCIUM 8.4* 8.4*   PT/INR No results for input(s): LABPROT, INR in the last 72 hours. ABG No results for input(s): PHART, HCO3 in the last 72 hours.  Invalid input(s): PCO2, PO2  Studies/Results: Ct Abdomen Pelvis W Contrast  Result Date: 05/15/2016 CLINICAL DATA:  Status post lysis of adhesions and colostomy. No output from colostomy yet. EXAM: CT ABDOMEN AND PELVIS WITH CONTRAST TECHNIQUE: Multidetector CT imaging of the abdomen and pelvis was performed using the standard protocol following bolus administration of intravenous contrast. CONTRAST:  ISOVUE-300 IOPAMIDOL (ISOVUE-300) INJECTION 61% COMPARISON:  April 26, 2016 FINDINGS: Lower chest: No acute abnormality. Hepatobiliary: The gallbladder is mildly distended with cholelithiasis. No wall thickening or pericholecystic fluid. There is hepatic steatosis. The portal vein is normal in appearance. Pancreas: Unremarkable. No pancreatic ductal dilatation or surrounding inflammatory  changes. Spleen: Normal in size without focal abnormality. Adrenals/Urinary Tract: There is a nonobstructive stone in the lower right kidney. There is a stone in the left renal pelvis with no hydronephrosis, unchanged. No ureterectasis or ureteral stone. No suspicious renal masses. The adrenal glands are normal. Stomach/Bowel: The distal esophagus and stomach are normal. There is mild dilatation of the small bowel into the left mid abdomen. The transition point is present on and coronal image 64. Distal to this region, the small bowel is relatively decompressed. The patient is status post partial colonic resection. The rectal stump is normal in appearance. The colostomy is unremarkable. Colonic diverticuli are seen with no diverticulitis. There is gas in the colon and fecal loading in the ascending colon. The appendix is prominent in caliber measuring 8.3 mm with no adjacent stranding or wall thickening. Vascular/Lymphatic: Atherosclerotic changes seen in the non aneurysmal abdominal aorta. No significant adenopathy. Reproductive: The patient is status post hysterectomy. Other: A small amount of air is seen along the anterior incision on axial image 69, likely postoperative with no evidence of abscess in this region. A small amount of air is seen in the right pelvis on axial image 80, probably in the anterior aspect of the bladder from recent catheterization. There is some scatter free fluid in the abdomen, likely postoperative as well as increased attenuation in the fat in some locations, likely postoperative. There is a focus of air in the left pelvis on axial image 64 and coronal image 82, likely some minimal remaining postoperative air. Musculoskeletal: The patient is status post right hip replacement. Degenerative changes seen in the spine. Anterior  listhesis of L5 versus S1. IMPRESSION: 1. There is mild dilatation of the proximal small bowel to the mid ileum with a transition point consistent with a partial or  early small bowel obstruction. I suspect a partial obstruction. 2. A single focus of air in the left side of the pelvis is likely postoperative. Recommend attention on follow-up and clinical correlation. 3. Free fluid and stranding in the abdomen, likely postoperative. 4. The appendix measures 8.3 mm which is prominent. However, there is no wall thickening or adjacent stranding to suggest appendicitis. Recommend clinical correlation. 5. Cholelithiasis. The gallbladder is distended but there is no evidence of inflammation. These results will be called to the ordering clinician or representative by the Radiologist Assistant, and communication documented in the PACS or zVision Dashboard. Electronically Signed   By: Gerome Samavid  Williams III M.D   On: 05/15/2016 12:53    Anti-infectives: Anti-infectives    Start     Dose/Rate Route Frequency Ordered Stop   05/07/16 0930  cefoTEtan (CEFOTAN) 2 g in dextrose 5 % 50 mL IVPB     2 g 100 mL/hr over 30 Minutes Intravenous On call to O.R. 05/06/16 1635 05/07/16 1240   05/04/16 1100  amoxicillin-clavulanate (AUGMENTIN) 875-125 MG per tablet 1 tablet  Status:  Discontinued     1 tablet Oral Every 12 hours 05/04/16 1015 05/06/16 1635   04/21/16 2015  piperacillin-tazobactam (ZOSYN) IVPB 3.375 g  Status:  Discontinued     3.375 g 12.5 mL/hr over 240 Minutes Intravenous Every 8 hours 04/21/16 2014 05/04/16 1015   04/21/16 1700  ciprofloxacin (CIPRO) IVPB 400 mg     400 mg 200 mL/hr over 60 Minutes Intravenous  Once 04/21/16 1652 04/21/16 1952   04/21/16 1700  metroNIDAZOLE (FLAGYL) IVPB 500 mg     500 mg 100 mL/hr over 60 Minutes Intravenous  Once 04/21/16 1652 04/21/16 1834   04/21/16 1645  clindamycin (CLEOCIN) IVPB 300 mg  Status:  Discontinued     300 mg 100 mL/hr over 30 Minutes Intravenous  Once 04/21/16 1643 04/21/16 1651   04/21/16 1645  clindamycin (CLEOCIN) IVPB 600 mg  Status:  Discontinued     600 mg 100 mL/hr over 30 Minutes Intravenous  Once  04/21/16 1644 04/21/16 1651      Assessment/Plan: s/p Procedure(s): EXPLORATORY LAPAROTOMY, LYLSIS OF ADHSIONS, EVACATION OF PERITONEAL ABSCESS (N/A) COLOSTOMY CREATION/HARTMANN PROCEDURE (N/A) Continue fulls until ileus resolves  ambulate  LOS: 25 days    TOTH III,PAUL S 05/16/2016

## 2016-05-17 MED ORDER — POLYETHYLENE GLYCOL 3350 17 G PO PACK
17.0000 g | PACK | Freq: Two times a day (BID) | ORAL | Status: DC
  Administered 2016-05-17: 17 g via ORAL
  Filled 2016-05-17 (×4): qty 1

## 2016-05-17 MED ORDER — FUROSEMIDE 10 MG/ML IJ SOLN
40.0000 mg | Freq: Once | INTRAMUSCULAR | Status: AC
  Administered 2016-05-17: 40 mg via INTRAVENOUS
  Filled 2016-05-17: qty 4

## 2016-05-17 MED ORDER — HYDROMORPHONE HCL 1 MG/ML IJ SOLN: 1.0000 mg | INTRAMUSCULAR | Status: DC | PRN

## 2016-05-17 MED ORDER — PANTOPRAZOLE SODIUM 40 MG PO TBEC
40.0000 mg | DELAYED_RELEASE_TABLET | Freq: Two times a day (BID) | ORAL | Status: DC
  Administered 2016-05-17 – 2016-05-19 (×6): 40 mg via ORAL
  Filled 2016-05-17 (×6): qty 1

## 2016-05-17 MED ORDER — SACCHAROMYCES BOULARDII 250 MG PO CAPS
250.0000 mg | ORAL_CAPSULE | Freq: Two times a day (BID) | ORAL | Status: DC
  Administered 2016-05-17 – 2016-05-19 (×5): 250 mg via ORAL
  Filled 2016-05-17 (×5): qty 1

## 2016-05-17 MED ORDER — ALUM & MAG HYDROXIDE-SIMETH 200-200-20 MG/5ML PO SUSP: 30.0000 mL | Freq: Four times a day (QID) | ORAL | Status: DC | PRN

## 2016-05-17 MED ORDER — METHOCARBAMOL 500 MG PO TABS: 1000.0000 mg | ORAL_TABLET | Freq: Four times a day (QID) | ORAL | Status: DC | PRN

## 2016-05-17 NOTE — Progress Notes (Addendum)
[address] [place], [place] [address] Phone: [phone] FAX: [place] [phone] 1940-10-25  CARE TEAM:  PCP: Vicenta Aly, FNP  Outpatient Care Team: Patient Care Team: Vicenta Aly, FNP as PCP - General (Nurse Practitioner)  Inpatient Treatment Team: Treatment Team: Attending Provider: Nolon Nations, MD; Consulting Physician: Md Edison Pace, MD; Technician: Wonda Cheng, NT; Technician: Tenna Child, NT; Registered Nurse: Lolita Rieger, RN; Registered Nurse: Yolanda Bonine, RN; Technician: Sueanne Margarita, NT; Technician: Parks Ranger, NT; Registered Nurse: Danie Chandler, RN  Problem List:   Principal Problem:   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 She has been hospitalized for over 2 weeks and has had a wax and wane response to medical treatment. It was decided that the most logical course of action would be to proceed to the OR for Hartman's procedure.   OR FINDINGS: Right lower quadrant abscess with adherent small bowel and sigmoid diverticulum  Assessment  iLEUS RESOLVING  Plan:  Chronic pain - takes percocet at home. Restart PRN  Bowel regimen Adv to solids GERD - PPI Diuresis Central line d/c'd Contact precautions with MRSA colonization VTE - SCDs, lovenox mobilize as tolerated to help recovery  Adin Hector, M.D., F.A.C.S. Gastrointestinal and Minimally Invasive Surgery [facility] [address], [place], [place] [address] [phone] Main / Paging   05/17/2016  Subjective:  BMs in colostomy bag Legs swollen Pain controlled C/o heartburn - felt that PPI helped  Objective:  Vital signs:  Vitals:   05/16/16 0626 05/16/16 1300 05/16/16 2222 05/17/16 0517  BP: (!) 120/50 136/82 (!) 157/71 (!) 164/82  Pulse: (!) 101 92 92 98  Resp: '18 17 16 18  ' Temp: 97.9 F (36.6 C) 98.4 F (36.9 C) 97.9 F (36.6 C) 98.5 F (36.9 C)  TempSrc: Oral Oral Oral Oral  SpO2: 100% 98% 100% 98%  Weight:      Height:        Last BM Date: 05/16/16  Intake/Output   Yesterday:  10/14 0701 - 10/15 0700 In: 720 [P.O.:720] Out: 175 [Urine:150; Stool:25] This shift:  No intake/output data recorded.  Bowel function:  Flatus: YES  BM:  YES  Drain: (No drain)   Physical Exam:  General: Pt awake/alert/oriented x4 in No acute distress Eyes: PERRL, normal EOM.  Sclera clear.  No icterus Neuro: CN II-XII intact w/o focal sensory/motor deficits. Lymph: No head/neck/groin lymphadenopathy Psych:  No delerium/psychosis/paranoia HENT: Normocephalic, Mucus membranes moist.  No thrush Neck: Supple, No tracheal deviation Chest: No chest wall pain w good excursion CV:  Pulses intact.  Regular rhythm MS: Normal AROM mjr joints.  No obvious deformity Abdomen: Soft.  Nondistended.  Mildly tender at incisions only.  No evidence of peritonitis.  No incarcerated hernias. Colosomy LLQ with liquid stool & gas. Ext:  SCDs BLE.  3+ edema.  No cyanosis Skin: No petechiae / purpura  Results:   Labs: Results for orders placed or performed during the hospital encounter of 04/21/16 (from the past 48 hour(s))  Magnesium  Status: None   Collection Time: 05/15/16  9:30 AM  Result Value Ref Range   Magnesium 1.7 1.7 - 2.4 mg/dL  Phosphorus     Status: None   Collection Time: 05/15/16  9:30 AM  Result Value Ref Range   Phosphorus 3.7 2.5 - 4.6 mg/dL  CBC     Status: Abnormal    Collection Time: 05/16/16  4:27 AM  Result Value Ref Range   WBC 5.0 4.0 - 10.5 K/uL   RBC 2.96 (L) 3.87 - 5.11 MIL/uL   Hemoglobin 8.9 (L) 12.0 - 15.0 g/dL   HCT 27.1 (L) 36.0 - 46.0 %   MCV 91.6 78.0 - 100.0 fL   MCH 30.1 26.0 - 34.0 pg   MCHC 32.8 30.0 - 36.0 g/dL   RDW 14.3 11.5 - 15.5 %   Platelets 291 150 - 400 K/uL  Basic metabolic panel     Status: Abnormal   Collection Time: 05/16/16  4:27 AM  Result Value Ref Range   Sodium 142 135 - 145 mmol/L   Potassium 3.7 3.5 - 5.1 mmol/L   Chloride 108 101 - 111 mmol/L   CO2 28 22 - 32 mmol/L   Glucose, Bld 92 65 - 99 mg/dL   BUN 7 6 - 20 mg/dL   Creatinine, Ser 0.40 (L) 0.44 - 1.00 mg/dL   Calcium 8.4 (L) 8.9 - 10.3 mg/dL   GFR calc non Af Amer >60 >60 mL/min   GFR calc Af Amer >60 >60 mL/min    Comment: (NOTE) The eGFR has been calculated using the CKD EPI equation. This calculation has not been validated in all clinical situations. eGFR's persistently <60 mL/min signify possible Chronic Kidney Disease.    Anion gap 6 5 - 15    Imaging / Studies: Ct Abdomen Pelvis W Contrast  Result Date: 05/15/2016 CLINICAL DATA:  Status post lysis of adhesions and colostomy. No output from colostomy yet. EXAM: CT ABDOMEN AND PELVIS WITH CONTRAST TECHNIQUE: Multidetector CT imaging of the abdomen and pelvis was performed using the standard protocol following bolus administration of intravenous contrast. CONTRAST:  152m ISOVUE-300 IOPAMIDOL (ISOVUE-300) INJECTION 61% COMPARISON:  April 26, 2016 FINDINGS: Lower chest: No acute abnormality. Hepatobiliary: The gallbladder is mildly distended with cholelithiasis. No wall thickening or pericholecystic fluid. There is hepatic steatosis. The portal vein is normal in appearance. Pancreas: Unremarkable. No pancreatic ductal dilatation or surrounding inflammatory changes. Spleen: Normal in size without focal abnormality. Adrenals/Urinary Tract: There is a nonobstructive stone in the lower right  kidney. There is a stone in the left renal pelvis with no hydronephrosis, unchanged. No ureterectasis or ureteral stone. No suspicious renal masses. The adrenal glands are normal. Stomach/Bowel: The distal esophagus and stomach are normal. There is mild dilatation of the small bowel into the left mid abdomen. The transition point is present on and coronal image 64. Distal to this region, the small bowel is relatively decompressed. The patient is status post partial colonic resection. The rectal stump is normal in appearance. The colostomy is unremarkable. Colonic diverticuli are seen with no diverticulitis. There is gas in the colon and fecal loading in the ascending colon. The appendix is prominent in caliber measuring 8.3 mm with no adjacent stranding or wall thickening. Vascular/Lymphatic: Atherosclerotic changes seen in the non aneurysmal abdominal aorta. No significant adenopathy. Reproductive: The patient is status post hysterectomy. Other: A small amount of air is seen along the anterior incision on axial image 69, likely postoperative with no evidence of abscess  in this region. A small amount of air is seen in the right pelvis on axial image 80, probably in the anterior aspect of the bladder from recent catheterization. There is some scatter free fluid in the abdomen, likely postoperative as well as increased attenuation in the fat in some locations, likely postoperative. There is a focus of air in the left pelvis on axial image 64 and coronal image 82, likely some minimal remaining postoperative air. Musculoskeletal: The patient is status post right hip replacement. Degenerative changes seen in the spine. Anterior listhesis of L5 versus S1. IMPRESSION: 1. There is mild dilatation of the proximal small bowel to the mid ileum with a transition point consistent with a partial or early small bowel obstruction. I suspect a partial obstruction. 2. A single focus of air in the left side of the pelvis is likely  postoperative. Recommend attention on follow-up and clinical correlation. 3. Free fluid and stranding in the abdomen, likely postoperative. 4. The appendix measures 8.3 mm which is prominent. However, there is no wall thickening or adjacent stranding to suggest appendicitis. Recommend clinical correlation. 5. Cholelithiasis. The gallbladder is distended but there is no evidence of inflammation. These results will be called to the ordering clinician or representative by the Radiologist Assistant, and communication documented in the PACS or zVision Dashboard. Electronically Signed   By: Dorise Bullion III M.D   On: 05/15/2016 12:53    Medications / Allergies: per chart  Antibiotics: Anti-infectives    Start     Dose/Rate Route Frequency Ordered Stop   05/07/16 0930  cefoTEtan (CEFOTAN) 2 g in dextrose 5 % 50 mL IVPB     2 g 100 mL/hr over 30 Minutes Intravenous On call to O.R. 05/06/16 1635 05/07/16 1240   05/04/16 1100  amoxicillin-clavulanate (AUGMENTIN) 875-125 MG per tablet 1 tablet  Status:  Discontinued     1 tablet Oral Every 12 hours 05/04/16 1015 05/06/16 1635   04/21/16 2015  piperacillin-tazobactam (ZOSYN) IVPB 3.375 g  Status:  Discontinued     3.375 g 12.5 mL/hr over 240 Minutes Intravenous Every 8 hours 04/21/16 2014 05/04/16 1015   04/21/16 1700  ciprofloxacin (CIPRO) IVPB 400 mg     400 mg 200 mL/hr over 60 Minutes Intravenous  Once 04/21/16 1652 04/21/16 1952   04/21/16 1700  metroNIDAZOLE (FLAGYL) IVPB 500 mg     500 mg 100 mL/hr over 60 Minutes Intravenous  Once 04/21/16 1652 04/21/16 1834   04/21/16 1645  clindamycin (CLEOCIN) IVPB 300 mg  Status:  Discontinued     300 mg 100 mL/hr over 30 Minutes Intravenous  Once 04/21/16 1643 04/21/16 1651   04/21/16 1645  clindamycin (CLEOCIN) IVPB 600 mg  Status:  Discontinued     600 mg 100 mL/hr over 30 Minutes Intravenous  Once 04/21/16 1644 04/21/16 1651        Note: Portions of this report may have been transcribed using  voice recognition software. Every effort was made to ensure accuracy; however, inadvertent computerized transcription errors may be present.   Any transcriptional errors that result from this process are unintentional.     Adin Hector, M.D., F.A.C.S. Gastrointestinal and Minimally Invasive Surgery [facility] [address], [place], [place] [address] [phone] Main / Paging   05/17/2016

## 2016-05-18 LAB — BASIC METABOLIC PANEL
ANION GAP: 8 (ref 5–15)
BUN: 10 mg/dL (ref 6–20)
CALCIUM: 8.4 mg/dL — AB (ref 8.9–10.3)
CO2: 25 mmol/L (ref 22–32)
CREATININE: 0.59 mg/dL (ref 0.44–1.00)
Chloride: 106 mmol/L (ref 101–111)
GLUCOSE: 107 mg/dL — AB (ref 65–99)
Potassium: 3.4 mmol/L — ABNORMAL LOW (ref 3.5–5.1)
Sodium: 139 mmol/L (ref 135–145)

## 2016-05-18 LAB — CBC
HCT: 32.4 % — ABNORMAL LOW (ref 36.0–46.0)
HEMOGLOBIN: 10.6 g/dL — AB (ref 12.0–15.0)
MCH: 29.6 pg (ref 26.0–34.0)
MCHC: 32.7 g/dL (ref 30.0–36.0)
MCV: 90.5 fL (ref 78.0–100.0)
PLATELETS: 330 10*3/uL (ref 150–400)
RBC: 3.58 MIL/uL — ABNORMAL LOW (ref 3.87–5.11)
RDW: 14.2 % (ref 11.5–15.5)
WBC: 6.3 10*3/uL (ref 4.0–10.5)

## 2016-05-18 LAB — MAGNESIUM: MAGNESIUM: 1.9 mg/dL (ref 1.7–2.4)

## 2016-05-18 LAB — PREALBUMIN: PREALBUMIN: 10.9 mg/dL — AB (ref 18–38)

## 2016-05-18 MED ORDER — PREMIER PROTEIN SHAKE
11.0000 [oz_av] | Freq: Two times a day (BID) | ORAL | Status: DC
Start: 2016-05-18 — End: 2016-05-19
  Administered 2016-05-19 (×2): 11 [oz_av] via ORAL

## 2016-05-18 MED ORDER — FUROSEMIDE 20 MG PO TABS
20.0000 mg | ORAL_TABLET | Freq: Once | ORAL | Status: AC
  Administered 2016-05-18: 20 mg via ORAL
  Filled 2016-05-18: qty 1

## 2016-05-18 MED ORDER — PANTOPRAZOLE SODIUM 40 MG PO TBEC: 40.0000 mg | DELAYED_RELEASE_TABLET | Freq: Two times a day (BID) | ORAL | 0 refills | Status: AC

## 2016-05-18 MED ORDER — OXYCODONE HCL 5 MG PO TABS: 5.0000 mg | ORAL_TABLET | Freq: Four times a day (QID) | ORAL | 0 refills | Status: DC | PRN

## 2016-05-18 MED ORDER — POLYETHYLENE GLYCOL 3350 17 G PO PACK: 17.0000 g | PACK | Freq: Two times a day (BID) | ORAL | 0 refills | Status: DC

## 2016-05-18 NOTE — Progress Notes (Signed)
[facility] Progress Note  11 Days Post-Op  Subjective: Pain controlled on home medications, having bowel function, ambulating, tolerating PO.   Objective: Vital signs in last 24 hours: Temp:  [98.4 F (36.9 C)-98.7 F (37.1 C)] 98.6 F (37 C) (10/16 0552) Pulse Rate:  [87-106] 87 (10/16 0552) Resp:  [20] 20 (10/16 0552) BP: (115-139)/(46-65) 139/64 (10/16 0552) SpO2:  [98 %-99 %] 98 % (10/16 0552) Last BM Date: 05/17/16  Intake/Output from previous day: 10/15 0701 - 10/16 0700 In: -  Out: 575 [Urine:125; Stool:450] Intake/Output this shift: No intake/output data recorded.  PE: Gen:  Alert, NAD, pleasant Card:  RRR, no M/G/R  Pulm:  CTA, no W/R/R Abd: Soft, NT/ND, +BS, midline incision c/d/i w/ steri strips in place, ostomy pouch with soft brown stool Ext: pitting edema of BL lower extremities  Lab Results:   Recent Labs  05/16/16 0427 05/18/16 0437  WBC 5.0 6.3  HGB 8.9* 10.6*  HCT 27.1* 32.4*  PLT 291 330   BMET  Recent Labs  05/16/16 0427 05/18/16 0437  NA 142 139  K 3.7 3.4*  CL 108 106  CO2 28 25  GLUCOSE 92 107*  BUN 7 10  CREATININE 0.40* 0.59  CALCIUM 8.4* 8.4*   PT/INR No results for input(s): LABPROT, INR in the last 72 hours. CMP     Component Value Date/Time   NA 139 05/18/2016 0437   K 3.4 (L) 05/18/2016 0437   CL 106 05/18/2016 0437   CO2 25 05/18/2016 0437   GLUCOSE 107 (H) 05/18/2016 0437   BUN 10 05/18/2016 0437   CREATININE 0.59 05/18/2016 0437   CALCIUM 8.4 (L) 05/18/2016 0437   PROT 6.3 (L) 05/06/2016 0731   ALBUMIN 3.5 05/06/2016 0731   AST 18 05/06/2016 0731   ALT 29 05/06/2016 0731   ALKPHOS 68 05/06/2016 0731   BILITOT 0.8 05/06/2016 0731   GFRNONAA >60 05/18/2016 0437   GFRAA >60 05/18/2016 0437   Lipase     Component Value Date/Time   LIPASE 19 04/21/2016 1735   No results found.  Anti-infectives: Anti-infectives    Start     Dose/Rate Route Frequency Ordered Stop   05/07/16 0930   cefoTEtan (CEFOTAN) 2 g in dextrose 5 % 50 mL IVPB     2 g 100 mL/hr over 30 Minutes Intravenous On call to O.R. 05/06/16 1635 05/07/16 1240   05/04/16 1100  amoxicillin-clavulanate (AUGMENTIN) 875-125 MG per tablet 1 tablet  Status:  Discontinued     1 tablet Oral Every 12 hours 05/04/16 1015 05/06/16 1635   04/21/16 2015  piperacillin-tazobactam (ZOSYN) IVPB 3.375 g  Status:  Discontinued     3.375 g 12.5 mL/hr over 240 Minutes Intravenous Every 8 hours 04/21/16 2014 05/04/16 1015   04/21/16 1700  ciprofloxacin (CIPRO) IVPB 400 mg     400 mg 200 mL/hr over 60 Minutes Intravenous  Once 04/21/16 1652 04/21/16 1952   04/21/16 1700  metroNIDAZOLE (FLAGYL) IVPB 500 mg     500 mg 100 mL/hr over 60 Minutes Intravenous  Once 04/21/16 1652 04/21/16 1834   04/21/16 1645  clindamycin (CLEOCIN) IVPB 300 mg  Status:  Discontinued     300 mg 100 mL/hr over 30 Minutes Intravenous  Once 04/21/16 1643 04/21/16 1651   04/21/16 1645  clindamycin (CLEOCIN) IVPB 600 mg  Status:  Discontinued     600 mg 100 mL/hr over 30 Minutes Intravenous  Once 04/21/16 1644 04/21/16 1651     Assessment/Plan  EXPLORATORY LAPAROTOMY, LYLSIS OF ADHSIONS, EVACATION OF PERITONEAL ABSCESS COLOSTOMY CREATION/HARTMANN PROCEDURE10/5/17 Dr. Maisie Fus; POD#11 - indication: perforated diverticulitis - pain controlled on home meds - having gas and stool in ostomy pouch - tolerating PO without nausea/vomiting - ambulating   GERD - PPI Chronic pain - takes percocet at home. Re-started   FEN - soft diet VTE - SCDs, lovenox  Plan - lasix for edema, continue soft diet and ensure discharge tomorrow with [facility] for colostomy care F/U with Dr. Maisie Fus in 2 weeks.    LOS: 27 days    Adam Phenix , [facility] 05/18/2016, 11:28 AM Pager: [phone] Consults: [phone] Mon-Fri 7:00 am-4:30 pm Sat-Sun 7:00 am-11:30 am

## 2016-05-18 NOTE — Progress Notes (Signed)
Nutrition Follow-up  DOCUMENTATION CODES:   Non-severe (moderate) malnutrition in context of acute illness/injury, Obesity unspecified  INTERVENTION:   D/c Prostat -pt refusing d/t heartburn Will trial Premier Protein RD to follow-up prior to discharge  NUTRITION DIAGNOSIS:   Inadequate oral intake related to nausea, vomiting as evidenced by per patient/family report, meal completion < 25%.  Improved.  GOAL:   Patient will meet greater than or equal to 90% of their needs  Progressing.  MONITOR:   PO intake, Supplement acceptance, Diet advancement, Labs, Weight trends, I & O's  ASSESSMENT:   75 y.o. female who had a busy weekend but began feeling bad Sunday night [place]  She has had a hip replacement and takes steroid injections per Dr. Metta Clinesrisp in [place] and she last received one 2 weeks ago.  She admits to constipation since she is on chronic pain meds.  She is followed by Dr. Italyhad Badger and his NP ordered a CT today which showed a perforation/abscess in the right lower quadrant.  Patient tolerating soft diet, consuming ~50%. Pt refusing Prostat supplements d/t heartburn. RD to d/c order. Will trial Premier Protein. Will follow-up for tolerance and acceptance prior to discharge. Per surgery note, pt may discharge tomorrow 10/17.  Medications: Miralax packet BID, Florastor capsule BID, Vitamin C tablet BID, Labs reviewed: Low K,  Mg/Phos WNL  Diet Order:  DIET SOFT Room service appropriate? Yes; Fluid consistency: Thin  Skin:  Reviewed, no issues  Last BM:  10/15  Height:   Ht Readings from Last 1 Encounters:  04/21/16 5\' 5"  (1.651 m)    Weight:   Wt Readings from Last 1 Encounters:  05/13/16 172 lb 14.4 oz (78.4 kg)    Ideal Body Weight:  57 kg  BMI:  Body mass index is 28.77 kg/m.  Estimated Nutritional Needs:   Kcal:  1800-2000  Protein:  95-105 grams  Fluid:  1.8-2.0 L/day  EDUCATION NEEDS:   No education  needs identified at this time  Tilda FrancoLindsey Bobbye Reinitz, MS, RD, LDN Pager: [phone] After Hours Pager: [phone]

## 2016-05-18 NOTE — Discharge Instructions (Signed)
[facility]      [facility] °[phone] ° °OPEN ABDOMINAL SURGERY: POST OP INSTRUCTIONS ° °Always review your discharge instruction sheet given to you by the facility where your surgery was performed. ° °IF YOU HAVE DISABILITY OR FAMILY LEAVE FORMS, YOU MUST BRING THEM TO THE OFFICE FOR PROCESSING.  PLEASE DO NOT GIVE THEM TO YOUR DOCTOR. ° °1. A prescription for pain medication may be given to you upon discharge.  Take your pain medication as prescribed, if needed.  If narcotic pain medicine is not needed, then you may take acetaminophen (Tylenol) or ibuprofen (Advil) as needed. °2. Take your usually prescribed medications unless otherwise directed. °3. If you need a refill on your pain medication, please contact your pharmacy. They will contact our office to request authorization.  Prescriptions will not be filled after 5pm or on week-ends. °4. You should follow a light diet the first few days after arrival home, such as soup and crackers, pudding, etc.unless your doctor has advised otherwise. A high-fiber, low fat diet can be resumed as tolerated.   Be sure to include lots of fluids daily. Most patients will experience some swelling and bruising on the chest and neck area.  Ice packs will help.  Swelling and bruising can take several days to resolve °5. Most patients will experience some swelling and bruising in the area of the incision. Ice pack will help. Swelling and bruising can take several days to resolve..  °6. It is common to experience some constipation if taking pain medication after surgery.  Increasing fluid intake and taking a stool softener will usually help or prevent this problem from occurring.  A mild laxative (Milk of Magnesia or Miralax) should be taken according to package directions if there are no bowel movements after 48 hours. °7.  You may have steri-strips (small skin tapes) in place directly over the incision.  These strips should be left on the skin for 7-10 days.  If your  surgeon used skin glue on the incision, you may shower in 24 hours.  The glue will flake off over the next 2-3 weeks.  Any sutures or staples will be removed at the office during your follow-up visit. You may find that a light gauze bandage over your incision may keep your staples from being rubbed or pulled. You may shower and replace the bandage daily. °8. ACTIVITIES:  You may resume regular (light) daily activities beginning the next day--such as daily self-care, walking, climbing stairs--gradually increasing activities as tolerated.  You may have sexual intercourse when it is comfortable.  Refrain from any heavy lifting or straining until approved by your doctor. °a. You may drive when you no longer are taking prescription pain medication, you can comfortably wear a seatbelt, and you can safely maneuver your car and apply brakes °b. Return to Work: ___________________________________ °9. You should see your doctor in the office for a follow-up appointment approximately two weeks after your surgery.  Make sure that you call for this appointment within a day or two after you arrive home to insure a convenient appointment time. °OTHER INSTRUCTIONS:  °_____________________________________________________________ °_____________________________________________________________ ° °WHEN TO CALL YOUR DOCTOR: °1. Fever over 101.0 °2. Inability to urinate °3. Nausea and/or vomiting °4. Extreme swelling or bruising °5. Continued bleeding from incision. °6. Increased pain, redness, or drainage from the incision. °7. Difficulty swallowing or breathing °8. Muscle cramping or spasms. °9. Numbness or tingling in hands or feet or around lips. ° °The clinic staff is available to   answer your questions during regular business hours.  Please dont hesitate to call and ask to speak to one of the nurses if you have concerns.  For further questions, please visit [email or web address]  Colostomy Surgery, Adult, Care  After Refer to this sheet in the next few weeks. These instructions provide you with information on caring for yourself after your procedure. Your caregiver may also give you more specific instructions. Your treatment has been planned according to current medical practices, but problems sometimes occur. Call your caregiver if you have any problems or questions after your procedure. HOME CARE INSTRUCTIONS Rest. Give yourself time to adjust to having the external pouch. Give your surgical cuts (incisions) time to heal. Avoid strenuous activity, heavy lifting, and abdominal exercises for 3 weeks. After that, you may return to your normal activities. Take pain medicine and other medicines as directed. Your caregiver may recommend certain medicines for the relief of constipation or diarrhea. You may shower with or without the pouch. Wear any clothing that is comfortable. Follow your caregiver's instructions to protect the skin around the stoma. Change your pouch every 2 to 4 days, or as directed. Follow your caregiver's dietary instructions. Your caregiver will help you understand which foods may cause blockage, increase bowel movements, slow bowel movements, cause skin irritation, or cause gas. You may gradually resume most activities, including sexual activity, as directed. Ask your caregiver about becoming pregnant and about using birth control. Medicines may not be absorbed normally after your procedure. Always discuss your medicines with your caregiver before taking them. You may travel. Be sure to pack plenty of colostomy supplies. If you smoke, quit. Smoking slows the healing process. SEEK MEDICAL CARE IF: You are having trouble caring for your stoma or using the ostomy supplies (changing the pouch). You feel sick to your stomach (nauseous). You throw up (vomit). You notice bleeding, skin irritation, drainage, bulging, redness of the wounds, or pain around the anus or stoma. You notice a change  in the size or appearance of the stoma. You have abdominal pain, bloating, pressure, or cramping. Your stools do not become firmer. Your stool frequency is more or less often than expected. You have frequent diarrhea or watery stool. You are not making enough urine. This may be a sign of dehydration. You experience sexual dysfunction. You experience shortness of breath, fatigue, thirst, dry mouth, or unusual sensations in the limbs. You have other new symptoms. You have questions or concerns. SEEK IMMEDIATE MEDICAL CARE IF: Your abdominal pain does not go away or it becomes severe. You have a fever. You keep vomiting. Your stool is not draining through the stoma. You have an irregular heartbeat or chest pain. MAKE SURE YOU: Understand these instructions. Will watch your condition. Will get help right away if you are not doing well or get worse.   This information is not intended to replace advice given to you by your health care provider. Make sure you discuss any questions you have with your health care provider.   Document Released: 12/10/2010 Document Revised: 08/10/2014 Document Reviewed: 12/10/2010 Elsevier Interactive Patient Education [employer]2016 [employer] Inc.

## 2016-05-19 MED ORDER — FUROSEMIDE 20 MG PO TABS: 20.0000 mg | ORAL_TABLET | Freq: Every day | ORAL | 0 refills | Status: DC | PRN

## 2016-05-19 NOTE — Care Management Important Message (Signed)
Important Message  Patient Details  Name: Monique [name] MRN: [id number] Date of Birth: 01/15/1941   Medicare Important Message Given:  Yes    Haskell FlirtJamison, Gisselle Galvis 05/19/2016, 12:11 PMImportant Message  Patient Details  Name: Monique [name] MRN: [id number] Date of Birth: 06/27/1941   Medicare Important Message Given:  Yes    Haskell FlirtJamison, Deano Tomaszewski 05/19/2016, 12:11 PM

## 2016-05-19 NOTE — Consult Note (Signed)
WOC Nurse ostomy follow up Stoma type/location: LLQ Colostomy Stomal assessment/size: 1 and 1/2 inches round, red, budded, moist Peristomal assessment: circumferential "gulley" Treatment options for stomal/peristomal skin: skin barrier ring Output soft brown stool Ostomy pouching: 1pc.pouching system, skin barrier ring Education provided: Extended session for pouching system change and education.  Patient taught GI A&P, stoma characteristics, pouch characteristics.  She is independent in pouch emptying and in performing Lock and Roll closure.  She is asking appropriate questions and has my contact information for questions post discharge.  [facility] and [facility] of education. Enrolled patient in [facility] Secure Start Discharge program: Yes, today. WOC nursing team will follow, and will remain available to this patient, the nursing, surgical and medical teams.   Thanks, Ladona MowLaurie Bertina Guthridge, MSN, RN, GNP, Hans EdenCWOCN, CWON-AP, FAAN  Pager# [phone]

## 2016-05-26 NOTE — Discharge Summary (Signed)
[facility] Discharge Summary   Patient ID: Monique [name] MRN: [id number] DOB/AGE: [birth year] 75 y.o.  Admit date: 04/21/2016 Discharge date: 05/19/2016  Admitting Diagnosis: Perforated viscus   Discharge Diagnosis Patient Active Problem List   Diagnosis Date Noted  . Malnutrition of moderate degree 05/11/2016  . SBO (small bowel obstruction) 04/26/2016  . Hypertension   . Abscess of peritoneum (HCC) 04/25/2016  . 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 Postop Hypokalemia 07/10/2011  . Primary osteoarthritis of right hip 07/08/2011   Consultants WOC nurse - 05/08/16, 05/19/16  Imaging: 04/21/16 CT ABD/PELV W CONTRAST- 4.2 x 2.7 cm complex fluid collection in the right mid abdomen with an air-fluid level most consistent with an abscess. Small volume pneumoperitoneum most consistent with perforated viscus. The complex fluid collection abuts a sigmoid diverticula without significant colonic wall thickening or surrounding inflammatory changes. Small bowel dilatation measuring up to 3.8 cm with a relative transition point in the right mid abdomen. Moderate amount of pelvic free fluid.  04/22/16 DG Abd 2V - High-grade small bowel obstruction.  04/23/16 DG Abd 1V - NGT placement  04/26/16 CT ABD/PELV W/ CONTRAST - Persisting small bowel obstruction, with the transition point in the mid to distal ileum immediately at the site of inflammation/abscess, most likely secondary to sigmoid diverticulitis/microperforation. Abscess in the right lower quadrant persists though does measure smaller than the comparison CT, previously 4.3 cm and now 3.4 cm.  04/30/16 DG Abd 1V - NGT replaced, normalizing bowel gas pattern   05/05/16 DG Abd 2 V - small bowel dilatation with multiple air-fluid levels, concerning for SBO.  05/15/16 CT ABD/PELV W/ CONTRAST - partial small bowel obstruction; single focus of air on left side of  pelvis, likely post-operative. Free fluid and stranding in the abdomen, likely post-operative. Cholelithiasis without evidence of inflammation.  Procedures Dr. Romie Levee (05/07/16) - Exploratory Laparotomy, lysis of adhesions, evacuation of peritoneal abscess, colostomy creation/hartmann procedure   Hospital Course:  75 year-old female with a PMH  of chronic pain (on percocet at home) and hypertension who presented to [facility] with abdominal pain. CT scan performed by her one of her outpatient physicians was significant for RLQ perforation with abscess - differential diagnosis small bowel perforation versus perforation of colonic diverticulum. She was minimally tender on abdominal exam. She was admitted for IV antibiotics and observation. On hospital day #1 an abdominal film was significant for an SBO and a nasogastric tube was placed. On hospital day #3 she had return of bowel function, normal WBC count, and abdominal tenderness improving. On hospital day #6 her NG tube was removed and repeat CT scan showed the RLQ fluid collection to be decreasing. On hospital day #8 patients WBC count increased to 12,000 and the patient developed nausea and vomiting. Abdominal film significant for recurrence of small bowel obstruction and an NG tube was placed. Leukocytosis resolved with continued antibiotics and observation. NG tube was again removed and diet advanced as tolerated. Patient continued to have nausea, vomiting, and obstructive symptoms and the decision was made to proceed with surgery on 05/07/16 (hospital day #16) - exploratory laparotomy, LOA, evacuation of abscess, and creation of colostomy. The patient tolerated the procedure well and was transferred to the floor with her NG tube still in place. NG tube was removed on POD#4, the patient was having very little NG output but did not have return of bowel function yet. Patient had return of bowel function on POD#6 and was  started on clear liquid diet. CT scan  was ordered to evaluate lack of colostomy output and was reassuring for no postoperative abscess formation.  Diet was advanced as tolerated, IV pain medications weaned, patient mobilizing with PT, and on POD#12 the patient was stable for discharge with home health for colostomy care.   Physical Exam: General:  Alert, NAD, pleasant, comfortable Abd:  Soft, ND, +BS, incision c/d/i w/ steri-strips in place, colostomy pouch with flatus and soft brown stool.    Medication List    TAKE these medications   aspirin EC 81 MG tablet Take 81 mg by mouth daily.   cetirizine 10 MG tablet Commonly known as:  ZYRTEC Take 10 mg by mouth at bedtime.   furosemide 20 MG tablet Commonly known as:  LASIX Take 1 tablet (20 mg total) by mouth daily as needed for edema.   lisinopril 20 MG tablet Commonly known as:  PRINIVIL,ZESTRIL Take 20 mg by mouth every morning.   oxyCODONE 5 MG immediate release tablet Commonly known as:  Oxy IR/ROXICODONE Take 1 tablet (5 mg total) by mouth every 6 (six) hours as needed for moderate pain, severe pain or breakthrough pain.   oxyCODONE-acetaminophen 10-325 MG tablet Commonly known as:  PERCOCET Take 1 tablet by mouth every 6 (six) hours as needed for pain.   pantoprazole 40 MG tablet Commonly known as:  PROTONIX Take 1 tablet (40 mg total) by mouth 2 (two) times daily before a meal.   polyethylene glycol packet Commonly known as:  MIRALAX / GLYCOLAX Take 17 g by mouth 2 (two) times daily.   simvastatin 40 MG tablet Commonly known as:  ZOCOR Take 40 mg by mouth at bedtime.   venlafaxine XR 150 MG 24 hr capsule Commonly known as:  EFFEXOR-XR Take 150 mg by mouth at bedtime.        Follow-up Information    Vanita PandaHOMAS, ALICIA C., MD. Schedule an appointment as soon as possible for a visit in 2 week(s).   Specialty:  General Surgery Why:  for post-operative follow-up Contact information: [address] [place] [address] [phone]            Signed: Hosie SpangleElizabeth Newman Waren, [facility] 05/26/2016, 3:39 PM Pager: [phone] Consults: [phone] Mon-Fri 7:00 am-4:30 pm Sat-Sun 7:00 am-11:30 am

## 2016-06-03 ENCOUNTER — Emergency Department (HOSPITAL_COMMUNITY): Payer: Medicare Other

## 2016-06-03 ENCOUNTER — Encounter (HOSPITAL_COMMUNITY): Payer: Self-pay | Admitting: Emergency Medicine

## 2016-06-03 ENCOUNTER — Emergency Department (HOSPITAL_COMMUNITY)
Admission: EM | Admit: 2016-06-03 | Discharge: 2016-06-03 | Disposition: A | Discharge status: Home / Self Care | Payer: Medicare Other | Attending: Emergency Medicine | Admitting: Emergency Medicine

## 2016-06-03 DIAGNOSIS — Y939 Activity, unspecified: Secondary | ICD-10-CM | POA: Diagnosis not present

## 2016-06-03 DIAGNOSIS — Y999 Unspecified external cause status: Secondary | ICD-10-CM | POA: Diagnosis not present

## 2016-06-03 DIAGNOSIS — Z7982 Long term (current) use of aspirin: Secondary | ICD-10-CM | POA: Diagnosis not present

## 2016-06-03 DIAGNOSIS — S42291A Other displaced fracture of upper end of right humerus, initial encounter for closed fracture: Secondary | ICD-10-CM | POA: Diagnosis not present

## 2016-06-03 DIAGNOSIS — J45909 Unspecified asthma, uncomplicated: Secondary | ICD-10-CM | POA: Insufficient documentation

## 2016-06-03 DIAGNOSIS — Y9289 Other specified places as the place of occurrence of the external cause: Secondary | ICD-10-CM | POA: Insufficient documentation

## 2016-06-03 DIAGNOSIS — S4991XA Unspecified injury of right shoulder and upper arm, initial encounter: Secondary | ICD-10-CM | POA: Diagnosis present

## 2016-06-03 DIAGNOSIS — I10 Essential (primary) hypertension: Secondary | ICD-10-CM | POA: Diagnosis not present

## 2016-06-03 DIAGNOSIS — W1800XA Striking against unspecified object with subsequent fall, initial encounter: Secondary | ICD-10-CM | POA: Diagnosis not present

## 2016-06-03 DIAGNOSIS — Z79899 Other long term (current) drug therapy: Secondary | ICD-10-CM | POA: Insufficient documentation

## 2016-06-03 DIAGNOSIS — R51 Headache: Secondary | ICD-10-CM | POA: Diagnosis not present

## 2016-06-03 MED ORDER — MORPHINE SULFATE (PF) 2 MG/ML IV SOLN: 4.0000 mg | INTRAVENOUS | Status: DC | PRN

## 2016-06-03 MED ORDER — ONDANSETRON HCL 4 MG/2ML IJ SOLN: 4.0000 mg | Freq: Once | INTRAMUSCULAR | Status: DC

## 2016-06-03 MED ORDER — OXYCODONE-ACETAMINOPHEN 5-325 MG PO TABS
2.0000 | ORAL_TABLET | Freq: Once | ORAL | Status: AC
  Administered 2016-06-03: 2 via ORAL
  Filled 2016-06-03: qty 2

## 2016-06-03 NOTE — Progress Notes (Signed)
Pt d/c on 10/17 with [facility]  Patient stated during her las hospitalization that her husband has 24 hr care at home that is private pay. She is unable to remember the name of the agency. She states that they are starting PT at [facility] for [facility]  [facility] and admission x 1 in last 6 months  This encounter-  c/o missed curb outside of PCP office and fell on right side. Patient c/o right shoulder pain

## 2016-06-03 NOTE — Progress Notes (Signed)
Pt returned to her ED room #4 when female visitor at bedside  CM spoke with Darl PikesSusan of advanced prior to seeing pt to confirm she is [facility]  When CM spoke with pt and female visitor CM informed pt CM had [facility] with [facility] about her ED visit and need for possible increased services  Pt agreed that Home health PT, OT and Aide maybe beneficial if d/c home  [place] [facility]

## 2016-06-03 NOTE — ED Notes (Signed)
Bed: WA04 Expected date:  Expected time:  Means of arrival:  Comments: EMS-fall/head lac

## 2016-06-03 NOTE — Discharge Instructions (Signed)
Continue your home pain medicine regimen.  Wear immobilizer at all times.  For follow-up appointment, call [facility]

## 2016-06-03 NOTE — Progress Notes (Signed)
Pt in imaging when CM visited female visitor at bedside

## 2016-06-03 NOTE — Progress Notes (Signed)
06/03/2016 2000pm  EDCM [facility] home health [facility] with confirmation of receipt.

## 2016-06-03 NOTE — ED Triage Notes (Signed)
Per EMS Patient missed curb outside of PCP office and fell on right side. Patient c/o right shoulder pain. Patient given Fentanyl in route via 20g in left hand.  Patient has c-collar in place.

## 2016-06-03 NOTE — ED Notes (Signed)
Patient transported to X-ray 

## 2016-06-03 NOTE — ED Provider Notes (Signed)
[facility]-EMERGENCY DEPT Provider Note   CSN: [id number] Arrival date & time: 06/03/16  1545     History   Chief Complaint Chief Complaint  Patient presents with  . Fall  . Shoulder Pain    HPI Monique [name] is a 75 y.o. female. She presents for evaluation after a fall. She was entering her primary care physician's office for routine appointment. She stepped up onto a curb. She lost her balance and tried to step back down. She did her feet became tangled. She fell with her right arm abducted against her side. She struck her head, and right shoulder. She has previous right humeral neck fracture that did not require operative repair.  She did not have loss consciousness. She takes only aspirin, no additional anticoagulation. She states she has pain to the lateral neck but denies any midline neck pain. She has pain above her right eyebrow in the area of contusion. No thoracic, or lumbar pain. She did not stand until immobilized by paramedics. However she sat on the curb. No pain with movement of her legs. HPI  Past Medical History:  Diagnosis Date  . Anemia     mild as per PCP  . Arthritis    hip/ s/p recent shoulder fracture 8/12- RIGHT  . Asthma   . Hiatal hernia   . Hypertension    hypercholesterolemia/  EKG on chart with clearance and note 06/18/11  Mazzocchi  . Pneumonia     Patient Active Problem List   Diagnosis Date Noted  . Malnutrition of moderate degree 05/11/2016  . SBO (small bowel obstruction) 04/26/2016  . Hypertension   . Abscess of peritoneum (HCC) 04/25/2016  . 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 Postop Hypokalemia 07/10/2011  . 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 ABDOMINAL HYSTERECTOMY    . COLECTOMY WITH COLOSTOMY CREATION/HARTMANN PROCEDURE N/A 05/07/2016   Procedure: COLOSTOMY CREATION/HARTMANN PROCEDURE;  Surgeon:  Romie Levee, MD;  Location: [facility] ORS;  Service: General;  Laterality: N/A;  . EYE SURGERY     eyelid tuck bilateral  . LAPAROTOMY N/A 05/07/2016   Procedure: EXPLORATORY LAPAROTOMY, LYLSIS OF ADHSIONS, Pam Drown OF PERITONEAL ABSCESS;  Surgeon: Romie Levee, MD;  Location: [facility] ORS;  Service: General;  Laterality: N/A;  . PARATHYROIDECTOMY     one lobe- benign per pt  . TONSILLECTOMY    . TOTAL HIP ARTHROPLASTY  07/08/2011   Procedure: TOTAL HIP ARTHROPLASTY;  Surgeon: Gus Rankin Aluisio;  Location: [facility] ORS;  Service: Orthopedics;  Laterality: Right;    OB History    No data available       Home Medications    Prior to Admission medications   Medication Sig Start Date End Date Taking? Authorizing Provider  aspirin EC 81 MG tablet Take 81 mg by mouth daily.   Yes Historical Provider, MD  cetirizine (ZYRTEC) 10 MG tablet Take 10 mg by mouth at bedtime.    Yes Historical Provider, MD  furosemide (LASIX) 20 MG tablet Take 1 tablet (20 mg total) by mouth daily as needed for edema. Patient taking differently: Take 20 mg by mouth daily.  05/19/16  Yes Elizabeth S Simaan, PA-C  lisinopril (PRINIVIL,ZESTRIL) 20 MG tablet Take 20 mg by mouth every morning.    Yes Historical Provider, MD  nystatin (MYCOSTATIN) 100000 UNIT/ML suspension Take 5 mLs by mouth 2 (two) times daily as needed for irritation. thrush 05/29/16  Yes Historical Provider, MD  oxyCODONE (OXY IR/ROXICODONE) 5 MG immediate release tablet Take 1 tablet (5 mg total) by mouth every 6 (six) hours as needed for moderate pain, severe pain or breakthrough pain. 05/18/16  Yes Francine GravenElizabeth S Simaan, PA-C  oxyCODONE-acetaminophen (PERCOCET) 10-325 MG tablet Take 1 tablet by mouth every 4 (four) hours as needed for pain.    Yes Historical Provider, MD  pantoprazole (PROTONIX) 40 MG tablet Take 1 tablet (40 mg total) by mouth 2 (two) times daily before a meal. 05/18/16  Yes Francine GravenElizabeth S Simaan, PA-C  polyethylene glycol (MIRALAX / GLYCOLAX) packet Take  17 g by mouth 2 (two) times daily. Patient taking differently: Take 17 g by mouth daily.  05/18/16  Yes Francine GravenElizabeth S Simaan, PA-C  promethazine (PHENERGAN) 12.5 MG tablet Take 12.5 mg by mouth every 6 (six) hours as needed for nausea or vomiting. 05/29/16  Yes Historical Provider, MD  simvastatin (ZOCOR) 40 MG tablet Take 40 mg by mouth at bedtime.    Yes Historical Provider, MD  venlafaxine XR (EFFEXOR-XR) 75 MG 24 hr capsule Take 225 mg by mouth at bedtime.  05/27/16  Yes Historical Provider, MD  sulfamethoxazole-trimethoprim (BACTRIM DS,SEPTRA DS) 800-160 MG tablet Take 1 tablet by mouth 2 (two) times daily. 05/27/16 05/25/16   Historical Provider, MD    Family History No family history on file.  Social History Social History  Substance Use Topics  . Smoking status: Never Smoker  . Smokeless tobacco: Never Used  . Alcohol use Yes     Comment: socially- 2 x year     Allergies   Morphine and related and Pregabalin   Review of Systems Review of Systems  Constitutional: Negative for appetite change, chills, diaphoresis, fatigue and fever.  HENT: Negative for mouth sores, sore throat and trouble swallowing.   Eyes: Negative for visual disturbance.  Respiratory: Negative for cough, chest tightness, shortness of breath and wheezing.   Cardiovascular: Negative for chest pain.  Gastrointestinal: Negative for abdominal distention, abdominal pain, diarrhea, nausea and vomiting.  Endocrine: Negative for polydipsia, polyphagia and polyuria.  Genitourinary: Negative for dysuria, frequency and hematuria.  Musculoskeletal: Positive for arthralgias. Negative for gait problem.       Right shoulder pain  Skin: Negative for color change, pallor and rash.  Neurological: Positive for headaches. Negative for dizziness, syncope and light-headedness.  Hematological: Does not bruise/bleed easily.  Psychiatric/Behavioral: Negative for behavioral problems and confusion.     Physical Exam Updated  Vital Signs BP 165/84 (BP Location: Left Arm)   Pulse 99   Temp 97.6 F (36.4 C) (Oral)   Resp 18   Ht 5\' 5"  (1.651 m)   Wt 173 lb (78.5 kg)   SpO2 99%   BMI 28.79 kg/m   Physical Exam  Constitutional: She is oriented to person, place, and time. She appears well-developed and well-nourished. No distress. Cervical collar in place.  HENT:  Head: Normocephalic.    Eyes: Conjunctivae are normal. Pupils are equal, round, and reactive to light. No scleral icterus.  Neck: Normal range of motion. No thyromegaly present.    Cardiovascular: Normal rate and regular rhythm.  Exam reveals no gallop and no friction rub.   No murmur heard. Pulmonary/Chest: Effort normal and breath sounds normal. No respiratory distress. She has no wheezes. She has no rales.  Abdominal: Soft. Bowel sounds are normal. She exhibits no distension. There is no tenderness. There is no rebound.  Musculoskeletal: Normal range of motion.  Neurological: She is alert  and oriented to person, place, and time.  Skin: Skin is warm and dry. No rash noted.  Psychiatric: She has a normal mood and affect. Her behavior is normal.     ED Treatments / Results  Labs (all labs ordered are listed, but only abnormal results are displayed) Labs Reviewed - No data to display  EKG  EKG Interpretation None       Radiology Dg Chest 1 View  Result Date: 06/03/2016 CLINICAL DATA:  Fall.  Pain to right side. EXAM: CHEST 1 VIEW COMPARISON:  05/07/2016. FINDINGS: Decreased lung volumes. Asymmetric elevation of the right hemidiaphragm is again noted. No pleural effusion or edema. There are advanced degenerative changes involving both glenohumeral joints. No displaced rib fractures identified. IMPRESSION: 1. No acute findings.  No displaced rib fractures identified. 2. Asymmetric elevation of right hemidiaphragm is again noted. Electronically Signed   By: Signa Kell M.D.   On: 06/03/2016 16:48   Dg Shoulder Right  Result Date:  06/03/2016 CLINICAL DATA:  Fall EXAM: RIGHT SHOULDER - 2+ VIEW COMPARISON:  CT right shoulder 04/02/2011 FINDINGS: Fracture of the right humeral neck with mild displacement and mild angulation. Severe degenerative change in the glenohumeral joint with extensive spurring of the glenoid and humeral head as noted on the prior study. Narrowing of the acromial humeral distance suggesting rotator cuff injury. IMPRESSION: Acute fracture of the right humeral neck with angulation and displacement Severe degenerative change in the right shoulder joint. Electronically Signed   By: Marlan Palau M.D.   On: 06/03/2016 16:46   Ct Head Wo Contrast  Result Date: 06/03/2016 CLINICAL DATA:  Patient fell. Head pain. Neck pain. Loss of consciousness is not reported. EXAM: CT HEAD WITHOUT CONTRAST CT CERVICAL SPINE WITHOUT CONTRAST TECHNIQUE: Multidetector CT imaging of the head and cervical spine was performed following the standard protocol without intravenous contrast. Multiplanar CT image reconstructions of the cervical spine were also generated. COMPARISON:  None. FINDINGS: CT HEAD FINDINGS Brain: No evidence for acute infarction, hemorrhage, mass lesion, hydrocephalus, or extra-axial fluid. Mild atrophy. Hypoattenuation of white matter representing chronic microvascular ischemic change. Vascular: No hyperdense vessel or unexpected calcification. Skull: No skull fracture. There is a moderately large RIGHT frontal scalp hematoma. Sinuses/Orbits: The frontal, ethmoid, and maxillary sinuses are clear. There is layering fluid with foamy secretions in the RIGHT division sphenoid sinus, along with associated sclerosis of the surrounding bony margins, suggesting both acute and chronic components. Other: No mastoid fluid. CT CERVICAL SPINE FINDINGS Alignment: Straightening of the normal cervical lordosis but no subluxation. Skull base and vertebrae: No base of skull fracture, vertebral body fracture, or odontoid injury. Posterior  elements appear intact. Multilevel facet arthropathy. Soft tissues and spinal canal: No prevertebral fluid or swelling. No visible canal hematoma. Carotid atherosclerosis. Disc levels: Multilevel disc space narrowing, most pronounced at C4-5, C5-6, and C6-7. Upper chest: No pneumothorax. RIGHT medial apical pleural thickening, incompletely evaluated. Other: None. IMPRESSION: Nose fracture or intracranial hemorrhage. RIGHT frontal scalp hematoma without visible laceration or foreign body. Chronic and acute RIGHT sphenoid sinusitis. Cervical spondylosis.  No fracture or traumatic subluxation. Electronically Signed   By: Elsie Stain M.D.   On: 06/03/2016 17:05   Ct Cervical Spine Wo Contrast  Result Date: 06/03/2016 CLINICAL DATA:  Patient fell. Head pain. Neck pain. Loss of consciousness is not reported. EXAM: CT HEAD WITHOUT CONTRAST CT CERVICAL SPINE WITHOUT CONTRAST TECHNIQUE: Multidetector CT imaging of the head and cervical spine was performed following the standard protocol without intravenous  contrast. Multiplanar CT image reconstructions of the cervical spine were also generated. COMPARISON:  None. FINDINGS: CT HEAD FINDINGS Brain: No evidence for acute infarction, hemorrhage, mass lesion, hydrocephalus, or extra-axial fluid. Mild atrophy. Hypoattenuation of white matter representing chronic microvascular ischemic change. Vascular: No hyperdense vessel or unexpected calcification. Skull: No skull fracture. There is a moderately large RIGHT frontal scalp hematoma. Sinuses/Orbits: The frontal, ethmoid, and maxillary sinuses are clear. There is layering fluid with foamy secretions in the RIGHT division sphenoid sinus, along with associated sclerosis of the surrounding bony margins, suggesting both acute and chronic components. Other: No mastoid fluid. CT CERVICAL SPINE FINDINGS Alignment: Straightening of the normal cervical lordosis but no subluxation. Skull base and vertebrae: No base of skull fracture,  vertebral body fracture, or odontoid injury. Posterior elements appear intact. Multilevel facet arthropathy. Soft tissues and spinal canal: No prevertebral fluid or swelling. No visible canal hematoma. Carotid atherosclerosis. Disc levels: Multilevel disc space narrowing, most pronounced at C4-5, C5-6, and C6-7. Upper chest: No pneumothorax. RIGHT medial apical pleural thickening, incompletely evaluated. Other: None. IMPRESSION: Nose fracture or intracranial hemorrhage. RIGHT frontal scalp hematoma without visible laceration or foreign body. Chronic and acute RIGHT sphenoid sinusitis. Cervical spondylosis.  No fracture or traumatic subluxation. Electronically Signed   By: Elsie StainJohn T Curnes M.D.   On: 06/03/2016 17:05    Procedures Procedures (including critical care time)  Medications Ordered in ED Medications  ondansetron (ZOFRAN) injection 4 mg (not administered)  morphine 2 MG/ML injection 4 mg (not administered)  oxyCODONE-acetaminophen (PERCOCET/ROXICET) 5-325 MG per tablet 2 tablet (2 tablets Oral Given 06/03/16 1826)     Initial Impression / Assessment and Plan / ED Course  I have reviewed the triage vital signs and the nursing notes.  Pertinent labs & imaging results that were available during my care of the patient were reviewed by me and considered in my medical decision making (see chart for details).  Clinical Course    CT of the head and neck pending. Right shoulder and chest x-ray pending.  Final Clinical Impressions(s) / ED Diagnoses   Final diagnoses:  Other closed displaced fracture of proximal end of right humerus, initial encounter    Proximal humerus fracture noted. Remainder of studies negative. Please sling and immobilizer. After IV pain meds and sitting upright symptoms were much improved. Given first dose by mouth pain medicine. She takes 10 mg oxycodone at home was operatively from her recent abdominal surgery. She has adequate supply of this and denies need for any  additional home. [facility] for her previous fractures. She is discharged home in the care of her son in good condition.  New Prescriptions New Prescriptions   No medications on file     Rolland PorterMark Zarianna Dicarlo, MD 06/03/16 1839

## 2016-07-15 ENCOUNTER — Other Ambulatory Visit: Payer: Self-pay | Admitting: General Surgery

## 2016-07-15 NOTE — H&P (Signed)
History of Present Illness Monique [name](Makisha Marrin MD; 07/14/2016 12:07 PM) The patient is a 75 year old female who presents with diverticulitis. 75 year old female who was seen in the hospital for small bowel obstruction due to diverticular abscess. This did not resolve with time. She was taken to the operating room in early October for a Hartman's procedure and lysis of adhesions. She was discharged from the hospital approximately 10 days later. Since that time she was recovering with some difficulty. She unfortunately fell out of a car and broke her humerus. She will be in the splint for this for the next 6-8 weeks. She has been doing well at home. She is hopefully going to get her mobility back and her right arm after seeing her orthopedic doctor later this week. She is anticipating being healed from her shoulder by early January. She is having good ostomy function and denies any abdominal pain. She denies any nausea.   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 Hysterectomy (not due to cancer) - Complete Resection of Small Bowel  Diagnostic Studies History Monique [name](Charletha Dalpe, MD; 07/15/2016 2:54 PM) Colonoscopy never Mammogram within last year Pap Smear >5 years ago  Allergies Juanita Craver(Armen Glenn, CMA; 07/14/2016 11:55 AM) Morphine Derivatives anxiety Lyrica *ANTICONVULSANTS*  Medication History (Armen Glenn, CMA; 07/14/2016 11:55 AM) Aspirin (81MG  Tablet, Oral) Active. ZyrTEC (10MG  Tablet, Oral) Active. Furosemide (20MG  Tablet, Oral) Active. Lisinopril (20MG  Tablet, Oral) Active. OxyCODONE HCl (5MG  Tablet, Oral) Active. Pantoprazole Sodium (40MG  Tablet DR, Oral) Active. Polyethylene Glycol 3350 (Oral) Active. Simvastatin (40MG  Tablet, Oral) Active. Venlafaxine  HCl ER (75MG  Capsule ER 24HR, Oral) Active. Medications Reconciled  Social History Monique [name](Annleigh Knueppel, MD; 07/15/2016 2:54 PM) Alcohol use Occasional alcohol use. No caffeine use No drug use Tobacco use Never smoker.  Family History Monique [name](Trampus Mcquerry, MD; 07/15/2016 2:54 PM) Cancer Mother. Depression Mother. Heart Disease Father. Heart disease in female family member before age 76 Hypertension Father.  Pregnancy / Birth History Monique [name](Kailana Benninger, MD; 07/15/2016 2:54 PM) Age at menarche 10 years. Age of menopause 50-55 Contraceptive History Oral contraceptives. Gravida 2 Irregular periods Length (months) of breastfeeding 3-6 Maternal age 76-25 Para 2  Other Problems Monique [name](Cattleya Dobratz, MD; 07/15/2016 2:54 PM) Anxiety Disorder Arthritis Asthma Back Pain Depression Diverticulosis Gastric Ulcer Gastroesophageal Reflux Disease High blood pressure Hypercholesterolemia Thyroid Disease     Review of Systems Monique [name](Yosselyn Tax MD; 07/15/2016 2:54 PM) General Not Present- Appetite Loss, Chills, Fatigue, Fever, Night Sweats, Weight Gain and Weight Loss. Skin Not Present- Change in Wart/Mole, Dryness, Hives, Jaundice, New Lesions, Non-Healing Wounds, Rash and Ulcer. HEENT Present- Oral Ulcers, Seasonal Allergies and Wears glasses/contact lenses. Not Present- Earache, Hearing Loss, Hoarseness, Nose Bleed, Ringing in the Ears, Sinus Pain, Sore Throat, Visual Disturbances and Yellow Eyes. Respiratory Not Present- Bloody sputum, Chronic Cough, Difficulty Breathing, Snoring and Wheezing. Breast Not Present- Breast Mass, Breast Pain, Nipple Discharge and Skin Changes. Cardiovascular Present- Swelling of Extremities. Not Present- Chest Pain, Difficulty Breathing Lying Down, Leg Cramps, Palpitations, Rapid Heart Rate and Shortness of Breath. Gastrointestinal Present- Nausea. Not Present- Abdominal Pain, Bloating, Bloody Stool, Change in Bowel Habits, Chronic diarrhea,  Constipation, Difficulty Swallowing, Excessive gas, Gets full quickly at meals, Hemorrhoids, Indigestion, Rectal Pain and Vomiting. Female Genitourinary Not Present- Frequency, Nocturia, Painful Urination, Pelvic Pain and Urgency. Musculoskeletal Present- Back Pain, Joint Pain, Joint Stiffness, Muscle Weakness  and Swelling of Extremities. Not Present- Muscle Pain. Neurological Present- Weakness. Not Present- Decreased Memory, Fainting, Headaches, Numbness, Seizures, Tingling, Tremor and Trouble walking. Psychiatric Present- Anxiety. Not Present- Bipolar, Change in Sleep Pattern, Depression, Fearful and Frequent crying. Hematology Present- Blood Thinners and Easy Bruising. Not Present- Excessive bleeding, Gland problems, HIV and Persistent Infections.  Vitals (Armen Glenn CMA; 07/14/2016 11:54 AM) 07/14/2016 11:54 AM Weight: 180.25 lb Height: 65in Body Surface Area: 1.89 m Body Mass Index: 29.99 kg/m  Temp.: 97.68F  Pulse: 84 (Regular)  BP: 140/86 (Sitting, Left Arm, Standard)      Physical Exam Monique [name](Ayano Douthitt MD; 07/15/2016 2:55 PM)  General Mental Status-Alert. General Appearance-Consistent with stated age. Hydration-Well hydrated. Voice-Normal.  Chest and Lung Exam Chest and lung exam reveals -quiet, even and easy respiratory effort with no use of accessory muscles. Inspection Chest Wall - Normal. Back - normal.  Cardiovascular Cardiovascular examination reveals -normal heart sounds, regular rate and rhythm with no murmurs.  Abdomen Inspection Skin - Scar - Note: Incision: clean, dry, and intact. Palpation/Percussion Palpation and Percussion of the abdomen reveal - Soft, Non Tender, No Rebound tenderness and No Rigidity (guarding).    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 Her ostomy is functioning and she denies any nausea. She thinks she should be  done with her shoulder therapy by mid January. I have recommended a colonoscopy prior to surgical ostomy reversal. We will plan on doing this the day before surgery. We will then plan on doing a laparoscopic ostomy reversal. This was explained to her in detail. All questions were answered. The surgery and anatomy were described to the patient as well as the risks of surgery and the possible complications. These include: Bleeding, deep abdominal infections and possible wound complications such as hernia and infection, damage to adjacent structures, leak of surgical connections, which can lead to other surgeries and possibly an ostomy, possible need for other procedures, such as abscess drains in radiology, possible prolonged hospital stay, possible diarrhea from removal of part of the colon, possible constipation from narcotics, possible bowel, bladder or sexual dysfunction if having rectal surgery, prolonged fatigue/weakness or appetite loss, possible early recurrence of of disease, possible complications of their medical problems such as heart disease or arrhythmias or lung problems, death (less than 1%). I believe the patient understands and wishes to proceed with the surgery.

## 2016-08-17 ENCOUNTER — Other Ambulatory Visit: Payer: Self-pay | Admitting: Pain Medicine

## 2016-08-21 NOTE — Patient Instructions (Addendum)
Monique [name]  08/21/2016   Your procedure is scheduled on: Thursday 08/27/2016  [facility]  Entrance take [place]  elevators to 3rd floor to  Short Stay Center at  0530   AM.  Call this number if you have problems the morning of surgery [phone]   Remember: ONLY 1 PERSON MAY GO WITH YOU TO SHORT STAY TO GET  READY MORNING OF YOUR SURGERY.               FOLLOW BOWEL PREP INSTRUCTIONS FROM DR. THOMAS OFFICE ALONG WITH A CLEAR LIQUID DIET ALL DAY ON Wednesday 08/26/2016!    CLEAR LIQUID DIET   Foods Allowed                                                                     Foods Excluded  Coffee and tea, regular and decaf                             liquids that you cannot  Plain Jell-O in any flavor                                             see through such as: Fruit ices (not with fruit pulp)                                     milk, soups, orange juice  Iced Popsicles                                    All solid food Carbonated beverages, regular and diet                                    Cranberry, grape and apple juices Sports drinks like Gatorade Lightly seasoned clear broth or consume(fat free) Sugar, honey syrup  Sample Menu Breakfast                                Lunch                                     Supper Cranberry juice                    Beef broth                            Chicken broth Jell-O  Grape juice                           Apple juice Coffee or tea                        Jell-O                                      Popsicle                                                Coffee or tea                        Coffee or tea  _____________________________________________________________________    Do not eat food or drink liquids :After Midnight.     Take these medicines the morning of surgery with A SIP OF WATER: Percocet if needed                               You  may not have any metal on your body including hair pins and              piercings  Do not wear jewelry, make-up, lotions, powders or perfumes, deodorant             Do not wear nail polish.  Do not shave  48 hours prior to surgery.              Men may shave face and neck.   Do not bring valuables to the hospital. [facility] IS NOT             RESPONSIBLE   FOR VALUABLES.  Contacts, dentures or bridgework may not be worn into surgery.  Leave suitcase in the car. After surgery it may be brought to your room.                 Please read over the following fact sheets you were given: _____________________________________________________________________             [facility] - Preparing for Surgery Before surgery, you can play an important role.  Because skin is not sterile, your skin needs to be as free of germs as possible.  You can reduce the number of germs on your skin by washing with CHG (chlorahexidine gluconate) soap before surgery.  CHG is an antiseptic cleaner which kills germs and bonds with the skin to continue killing germs even after washing. Please DO NOT use if you have an allergy to CHG or antibacterial soaps.  If your skin becomes reddened/irritated stop using the CHG and inform your nurse when you arrive at Short Stay. Do not shave (including legs and underarms) for at least 48 hours prior to the first CHG shower.  You may shave your face/neck. Please follow these instructions carefully:  1.  Shower with CHG Soap the night before surgery and the  morning of Surgery.  2.  If you choose to wash your hair, wash your hair first as usual with your  normal  shampoo.  3.  After you shampoo, rinse your hair and body thoroughly  to remove the  shampoo.                           4.  Use CHG as you would any other liquid soap.  You can apply chg directly  to the skin and wash                       Gently with a scrungie or clean washcloth.  5.  Apply the CHG Soap to your body ONLY  FROM THE NECK DOWN.   Do not use on face/ open                           Wound or open sores. Avoid contact with eyes, ears mouth and genitals (private parts).                       Wash face,  Genitals (private parts) with your normal soap.             6.  Wash thoroughly, paying special attention to the area where your surgery  will be performed.  7.  Thoroughly rinse your body with warm water from the neck down.  8.  DO NOT shower/wash with your normal soap after using and rinsing off  the CHG Soap.                9.  Pat yourself dry with a clean towel.            10.  Wear clean pajamas.            11.  Place clean sheets on your bed the night of your first shower and do not  sleep with pets. Day of Surgery : Do not apply any lotions/deodorants the morning of surgery.  Please wear clean clothes to the hospital/surgery center.  FAILURE TO FOLLOW THESE INSTRUCTIONS MAY RESULT IN THE CANCELLATION OF YOUR SURGERY PATIENT SIGNATURE_________________________________  NURSE SIGNATURE__________________________________  ________________________________________________________________________

## 2016-08-24 ENCOUNTER — Encounter (HOSPITAL_COMMUNITY)
Admission: RE | Admit: 2016-08-24 | Discharge: 2016-08-24 | Disposition: A | Discharge status: Home / Self Care | Payer: Medicare Other | Source: Ambulatory Visit | Attending: General Surgery | Admitting: General Surgery

## 2016-08-24 ENCOUNTER — Encounter (HOSPITAL_COMMUNITY): Payer: Self-pay | Admitting: *Deleted

## 2016-08-24 DIAGNOSIS — Z01818 Encounter for other preprocedural examination: Secondary | ICD-10-CM

## 2016-08-24 HISTORY — DX: Unspecified fracture of shaft of humerus, unspecified arm, initial encounter for closed fracture: S42.309A

## 2016-08-24 LAB — CBC
HEMATOCRIT: 40.2 % (ref 36.0–46.0)
HEMOGLOBIN: 12.9 g/dL (ref 12.0–15.0)
MCH: 28.6 pg (ref 26.0–34.0)
MCHC: 32.1 g/dL (ref 30.0–36.0)
MCV: 89.1 fL (ref 78.0–100.0)
Platelets: 188 10*3/uL (ref 150–400)
RBC: 4.51 MIL/uL (ref 3.87–5.11)
RDW: 14.3 % (ref 11.5–15.5)
WBC: 8.4 10*3/uL (ref 4.0–10.5)

## 2016-08-24 LAB — SURGICAL PCR SCREEN
MRSA, PCR: NEGATIVE
STAPHYLOCOCCUS AUREUS: NEGATIVE

## 2016-08-24 LAB — BASIC METABOLIC PANEL
ANION GAP: 8 (ref 5–15)
BUN: 29 mg/dL — ABNORMAL HIGH (ref 6–20)
CALCIUM: 9.3 mg/dL (ref 8.9–10.3)
CHLORIDE: 106 mmol/L (ref 101–111)
CO2: 26 mmol/L (ref 22–32)
Creatinine, Ser: 0.43 mg/dL — ABNORMAL LOW (ref 0.44–1.00)
GFR calc non Af Amer: >60 mL/min (ref >60)
GLUCOSE: 90 mg/dL (ref 65–99)
Potassium: 4.3 mmol/L (ref 3.5–5.1)
Sodium: 140 mmol/L (ref 135–145)

## 2016-08-26 ENCOUNTER — Encounter (HOSPITAL_COMMUNITY)
Admission: RE | Disposition: A | Discharge status: Home / Self Care | Payer: Self-pay | Source: Ambulatory Visit | Attending: General Surgery

## 2016-08-26 ENCOUNTER — Encounter (HOSPITAL_COMMUNITY): Payer: Self-pay | Admitting: *Deleted

## 2016-08-26 ENCOUNTER — Ambulatory Visit (HOSPITAL_COMMUNITY)
Admission: RE | Admit: 2016-08-26 | Discharge: 2016-08-26 | Disposition: A | Discharge status: Home / Self Care | Payer: Medicare Other | Source: Ambulatory Visit | Attending: General Surgery | Admitting: General Surgery

## 2016-08-26 DIAGNOSIS — Z1211 Encounter for screening for malignant neoplasm of colon: Secondary | ICD-10-CM

## 2016-08-26 DIAGNOSIS — Z7982 Long term (current) use of aspirin: Secondary | ICD-10-CM | POA: Insufficient documentation

## 2016-08-26 DIAGNOSIS — Z79899 Other long term (current) drug therapy: Secondary | ICD-10-CM | POA: Insufficient documentation

## 2016-08-26 DIAGNOSIS — K5730 Diverticulosis of large intestine without perforation or abscess without bleeding: Secondary | ICD-10-CM | POA: Insufficient documentation

## 2016-08-26 HISTORY — PX: COLONOSCOPY: SHX5424

## 2016-08-26 LAB — HEMOGLOBIN A1C
HEMOGLOBIN A1C: 5.9 % — AB (ref 4.8–5.6)
Mean Plasma Glucose: 123 mg/dL

## 2016-08-26 SURGERY — COLONOSCOPY
Anesthesia: Moderate Sedation

## 2016-08-26 MED ORDER — MIDAZOLAM HCL 5 MG/ML IJ SOLN
INTRAMUSCULAR | Status: AC
Start: 2016-08-26 — End: 2016-08-26
  Filled 2016-08-26: qty 2

## 2016-08-26 MED ORDER — DIPHENHYDRAMINE HCL 50 MG/ML IJ SOLN
INTRAMUSCULAR | Status: AC
  Filled 2016-08-26: qty 1

## 2016-08-26 MED ORDER — FENTANYL CITRATE (PF) 100 MCG/2ML IJ SOLN
INTRAMUSCULAR | Status: AC
  Filled 2016-08-26: qty 2

## 2016-08-26 MED ORDER — FENTANYL CITRATE (PF) 100 MCG/2ML IJ SOLN
INTRAMUSCULAR | Status: DC | PRN
  Administered 2016-08-26 (×2): 25 ug via INTRAVENOUS

## 2016-08-26 MED ORDER — MIDAZOLAM HCL 5 MG/5ML IJ SOLN
INTRAMUSCULAR | Status: DC | PRN
  Administered 2016-08-26: 1 mg via INTRAVENOUS
  Administered 2016-08-26: 2 mg via INTRAVENOUS
  Administered 2016-08-26: 1 mg via INTRAVENOUS

## 2016-08-26 MED ORDER — SODIUM CHLORIDE 0.9 % IV SOLN
INTRAVENOUS | Status: DC
  Administered 2016-08-26: 500 mL via INTRAVENOUS

## 2016-08-26 NOTE — Op Note (Signed)
[facility] Patient Name: Monique [name] Procedure Date: 08/26/2016 MRN: [id number] Attending MD: Romie LeveeAlicia Demari Gales , MD Date of Birth: 11/25/1940 CSN: [id number] Age: 76 Admit Type: Outpatient Procedure:                Colonoscopy Indications:              Screening for colorectal malignant neoplasm Providers:                Romie LeveeAlicia Ziyonna Christner, MD, Anthony Saraniel Madden, RN, Arlee Muslimhris                            Chandler Tech., Technician Referring MD:              Medicines:                Fentanyl 50 micrograms IV, Midazolam 5 mg IV Complications:            No immediate complications. Estimated Blood Loss:     Estimated blood loss: none. Procedure:                Pre-Anesthesia Assessment:                           - Prior to the procedure, a History and Physical                            was performed, and patient medications and                            allergies were reviewed. The patient's tolerance of                            previous anesthesia was also reviewed. The risks                            and benefits of the procedure and the sedation                            options and risks were discussed with the patient.                            All questions were answered, and informed consent                            was obtained. Prior Anticoagulants: The patient has                            taken no previous anticoagulant or antiplatelet                            agents. ASA Grade Assessment: II - A patient with                            mild systemic disease. After reviewing the risks  and benefits, the patient was deemed in                            satisfactory condition to undergo the procedure.                           - Prior to the procedure, a History and Physical                            was performed, and patient medications, allergies                            and sensitivities were reviewed. The patient's               tolerance of previous anesthesia was reviewed.                           - The risks and benefits of the procedure and the                            sedation options and risks were discussed with the                            patient. All questions were answered and informed                            consent was obtained.                           - Patient identification and proposed procedure                            were verified prior to the procedure by the                            physician, the nurse and the technician. The                            procedure was verified in the procedure room.                           - The anesthesia plan was to use moderate                            sedation/analgesia (conscious sedation).                           - Sedation was administered by an endoscopy nurse.                            The sedation level attained was moderate.                           - The heart rate, respiratory rate, oxygen  saturations, blood pressure, adequacy of pulmonary                            ventilation, and response to care were monitored                            throughout the procedure.                           After obtaining informed consent, the colonoscope                            was passed under direct vision. Throughout the                            procedure, the patient's blood pressure, pulse, and                            oxygen saturations were monitored continuously. The                            EC-[id number] ([id number]) scope was introduced through                            the descending colostomy and advanced to the the                            cecum, identified by appendiceal orifice and                            ileocecal valve. The terminal ileum and the                            appendiceal orifice were photographed. Scope                            withdrawal time was 27 minutes.  The quality of the                            bowel preparation was adequate to identify polyps.                            The colonoscopy was performed without difficulty.                            The patient tolerated the procedure well. Scope In: 3:22:10 PM Scope Out: 4:03:20 PM Scope Withdrawal Time: 0 hours 27 minutes 5 seconds  Total Procedure Duration: 0 hours 41 minutes 10 seconds  Findings:      The perianal and digital rectal examinations were normal. Pertinent       negatives include normal sphincter tone.      A few small-mouthed diverticula were found in the descending colon.      The exam was otherwise without abnormality. Impression:               -  Diverticulosis in the descending colon.                           - The examination was otherwise normal.                           - No specimens collected. Moderate Sedation:      Moderate (conscious) sedation was administered by the endoscopy nurse       and supervised by the endoscopist. The following parameters were       monitored: oxygen saturation, heart rate, blood pressure, and response       to care. Recommendation:           - Discharge patient to home (ambulatory).                           - Clear liquid diet today.                           - Continue present medications.                           - Patient has a contact number available for                            emergencies. The signs and symptoms of potential                            delayed complications were discussed with the                            patient. Return to normal activities tomorrow.                            Written discharge instructions were provided to the                            patient.                           - Return to normal activities tomorrow.                           - Repeat colonoscopy in 10 years for screening                            purposes. Procedure Code(s):        --- Professional ---                            [phone], Colonoscopy through stoma; diagnostic,                            including collection of specimen(s) by brushing or                            washing, when performed (separate procedure) Diagnosis  Code(s):        --- Professional ---                           Z12.11, Encounter for screening for malignant                            neoplasm of colon                           K57.30, Diverticulosis of large intestine without                            perforation or abscess without bleeding CPT copyright 2016 American [employer]. All rights reserved. The codes documented in this report are preliminary and upon coder review may  be revised to meet current compliance requirements. Romie Levee, MD Romie Levee, MD 08/26/2016 4:11:47 PM This report has been signed electronically. Number of Addenda: 0

## 2016-08-26 NOTE — Progress Notes (Signed)
Patient came to recovery with an open stoma, daughter gave me materials to place a new colostomy bag onto the stoma. The area was measured and the new barrier was placed without skin showing and the bag was attached.

## 2016-08-26 NOTE — Anesthesia Preprocedure Evaluation (Signed)
Anesthesia Evaluation  Patient identified by MRN, date of birth, ID band Patient awake    Reviewed: Allergy & Precautions, NPO status , Patient's Chart, lab work & pertinent test results  Airway Mallampati: I  TM Distance: >3 FB Neck ROM: Full    Dental  (+) Teeth Intact, Dental Advisory Given   Pulmonary asthma , pneumonia,    breath sounds clear to auscultation       Cardiovascular hypertension, Pt. on medications  Rhythm:Regular Rate:Normal     Neuro/Psych  Neuromuscular disease negative psych ROS   GI/Hepatic Neg liver ROS, hiatal hernia,   Endo/Other  negative endocrine ROS  Renal/GU negative Renal ROS     Musculoskeletal  (+) Arthritis , Osteoarthritis,    Abdominal (+) + obese,   Peds  Hematology  (+) anemia ,   Anesthesia Other Findings   Reproductive/Obstetrics negative OB ROS                             Lab Results  Component Value Date   WBC 8.4 08/24/2016   HGB 12.9 08/24/2016   HCT 40.2 08/24/2016   MCV 89.1 08/24/2016   PLT 188 08/24/2016   Lab Results  Component Value Date   CREATININE 0.43 (L) 08/24/2016   BUN 29 (H) 08/24/2016   NA 140 08/24/2016   K 4.3 08/24/2016   CL 106 08/24/2016   CO2 26 08/24/2016   Lab Results  Component Value Date   INR 1.02 07/02/2011   04/2016 EKG: normal sinus rhythm.  Anesthesia Physical  Anesthesia Plan  ASA: II  Anesthesia Plan: General   Post-op Pain Management:    Induction: Intravenous  Airway Management Planned: Oral ETT  Additional Equipment:   Intra-op Plan:   Post-operative Plan: Extubation in OR  Informed Consent: I have reviewed the patients History and Physical, chart, labs and discussed the procedure including the risks, benefits and alternatives for the proposed anesthesia with the patient or authorized representative who has indicated his/her understanding and acceptance.   Dental advisory  given  Plan Discussed with: CRNA  Anesthesia Plan Comments:         Anesthesia Quick Evaluation

## 2016-08-26 NOTE — Discharge Instructions (Signed)
Post Colonoscopy Instructions ° °1. DIET: Follow a light bland diet the first 24 hours after arrival home, such as soup, liquids, crackers, etc.  Be sure to include lots of fluids daily.  Avoid fast food or heavy meals as your are more likely to get nauseated.   °2. You may have some mild rectal bleeding for the first few days after the procedure.  This should get less and less with time.  Resume any blood thinners 2 days after your procedure unless directed otherwise by your physician. °3. Take your usually prescribed home medications unless otherwise directed. °a. If you have any pain, it is helpful to get up and walk around, as it is usually from excess gas. °b. If this is not helpful, you can take an over-the-counter pain medication.  Choose one of the following that works best for you: °i. Naproxen (Aleve, etc)  Two 220mg tabs twice a day °ii. Ibuprofen (Advil, etc) Three 200mg tabs four times a day (every meal & bedtime) °iii. If you still have pain after using one of these, please call the office °4. It is normal to not have a bowel movement for 2-3 days after colonoscopy.   ° °5. ACTIVITIES as tolerated:   °6. You may resume regular (light) daily activities beginning the next day--such as daily self-care, walking, climbing stairs--gradually increasing activities as tolerated.  ° ° °WHEN TO CALL US [phone]: °1. Fever over 101.5 F (38.5 C)  °2. Severe abdominal or chest pain  °3. Large amount of rectal bleeding, passing multiple blood clots  °4. Dizziness or shortness of breath °5. Increasing nausea or vomiting ° ° The clinic staff is available to answer your questions during regular business hours (8:30am-5pm).  Please don’t hesitate to call and ask to speak to one of our nurses for clinical concerns.  ° If you have a medical emergency, go to the nearest emergency room or call 911. ° A surgeon from [facility] is always on call at the hospitals ° ° °[facility] °[address], [address], [place], [place]  [address] ? °MAIN: [phone] ? TOLL FREE: [phone] ?  °FAX [phone] °[email or web address] ° ° °

## 2016-08-26 NOTE — H&P (Signed)
The patient is a 76 year old female who presents with diverticulitis. 76 year old female who was hospitalized for small bowel obstruction due to diverticular abscess. This did not resolve with time. She was taken to the operating room in early October for a Hartman's procedure and lysis of adhesions. She was discharged from the hospital approximately 10 days later. Since that time she has recovered with some difficulty. She unfortunately fell out of a car and broke her humerus. She was in the splint for this for the 8 weeks. She had been doing well at home but then her husband passed away suddenly earlier this month. She is having good ostomy function and denies any abdominal pain. She denies any nausea.   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 Hysterectomy (not due to cancer) - Complete Resection of Small Bowel  Diagnostic Studies History Romie Levee(Sharone Picchi, MD; 07/15/2016 2:54 PM) Colonoscopy never Mammogram within last year Pap Smear >5 years ago  Allergies Juanita Craver(Armen Glenn, CMA; 07/14/2016 11:55 AM) Morphine Derivatives anxiety Lyrica *ANTICONVULSANTS*  Medication History (Armen Glenn, CMA; 07/14/2016 11:55 AM) Aspirin (81MG  Tablet, Oral) Active. ZyrTEC (10MG  Tablet, Oral) Active. Furosemide (20MG  Tablet, Oral) Active. Lisinopril (20MG  Tablet, Oral) Active. OxyCODONE HCl (5MG  Tablet, Oral) Active. Pantoprazole Sodium (40MG  Tablet DR, Oral) Active. Polyethylene Glycol 3350 (Oral) Active. Simvastatin (40MG  Tablet, Oral) Active. Venlafaxine HCl ER (75MG  Capsule ER 24HR, Oral) Active. Medications Reconciled  Social History Romie Levee(Armida Vickroy, MD; 07/15/2016 2:54 PM) Alcohol use Occasional alcohol use. No caffeine use No drug use Tobacco use Never  smoker.  Family History Romie Levee(Thresea Doble, MD; 07/15/2016 2:54 PM) Cancer Mother. Depression Mother. Heart Disease Father. Heart disease in female family member before age 77 Hypertension Father.  Pregnancy / Birth History Romie Levee(Iniko Robles, MD; 07/15/2016 2:54 PM) Age at menarche 10 years. Age of menopause 51-55 Contraceptive History Oral contraceptives. Gravida 2 Irregular periods Length (months) of breastfeeding 3-6 Maternal age 77-25 Para 2  Other Problems Romie Levee(Lavra Imler, MD; 07/15/2016 2:54 PM) Anxiety Disorder Arthritis Asthma Back Pain Depression Diverticulosis Gastric Ulcer Gastroesophageal Reflux Disease High blood pressure Hypercholesterolemia Thyroid Disease     Review of Systems  General Not Present- Appetite Loss, Chills, Fatigue, Fever, Night Sweats, Weight Gain and Weight Loss. Skin Not Present- Change in Wart/Mole, Dryness, Hives, Jaundice, New Lesions, Non-Healing Wounds, Rash and Ulcer. HEENT Present- Oral Ulcers, Seasonal Allergies and Wears glasses/contact lenses. Not Present- Earache, Hearing Loss, Hoarseness, Nose Bleed, Ringing in the Ears, Sinus Pain, Sore Throat, Visual Disturbances and Yellow Eyes. Respiratory Not Present- Bloody sputum, Chronic Cough, Difficulty Breathing, Snoring and Wheezing. Breast Not Present- Breast Mass, Breast Pain, Nipple Discharge and Skin Changes. Cardiovascular Present- Swelling of Extremities. Not Present- Chest Pain, Difficulty Breathing Lying Down, Leg Cramps, Palpitations, Rapid Heart Rate and Shortness of Breath. Gastrointestinal Present- Nausea. Not Present- Abdominal Pain, Bloating, Bloody Stool, Change in Bowel Habits, Chronic diarrhea, Constipation, Difficulty Swallowing, Excessive gas, Gets full quickly at meals, Hemorrhoids, Indigestion, Rectal Pain and Vomiting. Female Genitourinary Not Present- Frequency, Nocturia, Painful Urination, Pelvic Pain and Urgency. Musculoskeletal Present-  Back Pain, Joint Pain, Joint Stiffness, Muscle Weakness and Swelling of Extremities. Not Present- Muscle Pain. Neurological Present- Weakness. Not Present- Decreased Memory, Fainting, Headaches, Numbness, Seizures, Tingling, Tremor and Trouble walking. Psychiatric Present- Anxiety. Not Present- Bipolar, Change in Sleep Pattern, Depression, Fearful and Frequent crying. Hematology Present- Blood Thinners and  Easy Bruising. Not Present- Excessive bleeding, Gland problems, HIV and Persistent Infections.  BP (!) 160/87   Temp 98.3 F (36.8 C) (Oral)   Resp 15   SpO2 96%    Physical Exam   General Mental Status-Alert. General Appearance-Consistent with stated age. Hydration-Well hydrated. Voice-Normal.  Chest and Lung Exam Chest and lung exam reveals -quiet, even and easy respiratory effort with no use of accessory muscles. Inspection Chest Wall - Normal. Back - normal.  Cardiovascular Cardiovascular examination reveals -normal heart sounds, regular rate and rhythm with no murmurs.  Abdomen Inspection Skin - Scar - Note: Incision: clean, dry, and intact. Palpation/Percussion Palpation and Percussion of the abdomen reveal - Soft, Non Tender, No Rebound tenderness and No Rigidity (guarding). 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 Her ostomy is functioning and she denies any nausea. She is done with her shoulder therapy. I have recommended a colonoscopy prior to surgical ostomy reversal for screening purposes.  Risks if bleeding, perforation and missed pathology were discussed with pt along with potential cardiopulmonary complications from moderate sedation.   These are all minimal and do not outweigh the benefits of the procedure.  I believe she understands this and has agreed to proceed.

## 2016-08-27 ENCOUNTER — Inpatient Hospital Stay (HOSPITAL_COMMUNITY): Payer: Medicare Other | Admitting: Anesthesiology

## 2016-08-27 ENCOUNTER — Inpatient Hospital Stay (HOSPITAL_COMMUNITY)
Admission: RE | Admit: 2016-08-27 | Discharge: 2016-09-01 | DRG: 331 | Disposition: A | Discharge status: Home Health | Payer: Medicare Other | Source: Ambulatory Visit | Attending: General Surgery | Admitting: General Surgery

## 2016-08-27 ENCOUNTER — Encounter (HOSPITAL_COMMUNITY): Payer: Self-pay | Admitting: *Deleted

## 2016-08-27 ENCOUNTER — Encounter (HOSPITAL_COMMUNITY)
Admission: RE | Disposition: A | Discharge status: Home Health | Payer: Self-pay | Source: Ambulatory Visit | Attending: General Surgery

## 2016-08-27 DIAGNOSIS — J45909 Unspecified asthma, uncomplicated: Secondary | ICD-10-CM | POA: Diagnosis present

## 2016-08-27 DIAGNOSIS — Z7982 Long term (current) use of aspirin: Secondary | ICD-10-CM

## 2016-08-27 DIAGNOSIS — N736 Female pelvic peritoneal adhesions (postinfective): Secondary | ICD-10-CM | POA: Diagnosis present

## 2016-08-27 DIAGNOSIS — M1990 Unspecified osteoarthritis, unspecified site: Secondary | ICD-10-CM | POA: Diagnosis present

## 2016-08-27 DIAGNOSIS — Z6829 Body mass index (BMI) 29.0-29.9, adult: Secondary | ICD-10-CM

## 2016-08-27 DIAGNOSIS — Z933 Colostomy status: Secondary | ICD-10-CM

## 2016-08-27 DIAGNOSIS — K5730 Diverticulosis of large intestine without perforation or abscess without bleeding: Secondary | ICD-10-CM | POA: Diagnosis present

## 2016-08-27 DIAGNOSIS — I10 Essential (primary) hypertension: Secondary | ICD-10-CM | POA: Diagnosis present

## 2016-08-27 DIAGNOSIS — E669 Obesity, unspecified: Secondary | ICD-10-CM | POA: Diagnosis present

## 2016-08-27 DIAGNOSIS — Z90710 Acquired absence of both cervix and uterus: Secondary | ICD-10-CM

## 2016-08-27 DIAGNOSIS — Z433 Encounter for attention to colostomy: Principal | ICD-10-CM

## 2016-08-27 HISTORY — PX: COLOSTOMY TAKEDOWN: SHX5258

## 2016-08-27 SURGERY — CLOSURE, COLOSTOMY, LAPAROSCOPIC
Anesthesia: General

## 2016-08-27 MED ORDER — CEFOTETAN DISODIUM-DEXTROSE 2-2.08 GM-% IV SOLR
INTRAVENOUS | Status: AC
  Filled 2016-08-27: qty 50

## 2016-08-27 MED ORDER — ONDANSETRON HCL 4 MG PO TABS: 4.0000 mg | ORAL_TABLET | Freq: Four times a day (QID) | ORAL | Status: DC | PRN

## 2016-08-27 MED ORDER — BUPIVACAINE HCL (PF) 0.25 % IJ SOLN
INTRAMUSCULAR | Status: AC
  Filled 2016-08-27: qty 30

## 2016-08-27 MED ORDER — HYDROMORPHONE HCL 1 MG/ML IJ SOLN
INTRAMUSCULAR | Status: AC
  Filled 2016-08-27: qty 1

## 2016-08-27 MED ORDER — LABETALOL HCL 5 MG/ML IV SOLN
INTRAVENOUS | Status: AC
  Filled 2016-08-27: qty 4

## 2016-08-27 MED ORDER — SUCCINYLCHOLINE CHLORIDE 200 MG/10ML IV SOSY
PREFILLED_SYRINGE | INTRAVENOUS | Status: DC | PRN
  Administered 2016-08-27: 100 mg via INTRAVENOUS

## 2016-08-27 MED ORDER — FENTANYL CITRATE (PF) 100 MCG/2ML IJ SOLN
INTRAMUSCULAR | Status: AC
  Filled 2016-08-27: qty 2

## 2016-08-27 MED ORDER — SODIUM CHLORIDE 0.9 % IR SOLN
Status: DC | PRN
  Administered 2016-08-27: 1000 mL

## 2016-08-27 MED ORDER — PANTOPRAZOLE SODIUM 40 MG PO TBEC
40.0000 mg | DELAYED_RELEASE_TABLET | Freq: Two times a day (BID) | ORAL | Status: DC
  Administered 2016-08-27 – 2016-09-01 (×10): 40 mg via ORAL
  Filled 2016-08-27 (×10): qty 1

## 2016-08-27 MED ORDER — LACTATED RINGERS IV SOLN: INTRAVENOUS | Status: DC

## 2016-08-27 MED ORDER — DEXTROSE 5 % IV SOLN
2.0000 g | Freq: Two times a day (BID) | INTRAVENOUS | Status: AC
  Administered 2016-08-27: 2 g via INTRAVENOUS
  Filled 2016-08-27: qty 2

## 2016-08-27 MED ORDER — SUGAMMADEX SODIUM 200 MG/2ML IV SOLN
INTRAVENOUS | Status: AC
  Filled 2016-08-27: qty 2

## 2016-08-27 MED ORDER — ACETAMINOPHEN 500 MG PO TABS
1000.0000 mg | ORAL_TABLET | Freq: Four times a day (QID) | ORAL | Status: AC
  Administered 2016-08-27 – 2016-08-28 (×4): 1000 mg via ORAL
  Filled 2016-08-27 (×4): qty 2

## 2016-08-27 MED ORDER — DIPHENHYDRAMINE HCL 12.5 MG/5ML PO ELIX: 12.5000 mg | ORAL_SOLUTION | Freq: Four times a day (QID) | ORAL | Status: DC | PRN

## 2016-08-27 MED ORDER — HEPARIN SODIUM (PORCINE) 5000 UNIT/ML IJ SOLN
5000.0000 [IU] | Freq: Once | INTRAMUSCULAR | Status: AC
  Administered 2016-08-27: 5000 [IU] via SUBCUTANEOUS
  Filled 2016-08-27: qty 1

## 2016-08-27 MED ORDER — HYDROMORPHONE HCL 1 MG/ML IJ SOLN
0.2500 mg | INTRAMUSCULAR | Status: DC | PRN
  Administered 2016-08-27 (×4): 0.5 mg via INTRAVENOUS

## 2016-08-27 MED ORDER — LORATADINE 10 MG PO TABS
10.0000 mg | ORAL_TABLET | Freq: Every day | ORAL | Status: DC
  Administered 2016-08-27 – 2016-09-01 (×6): 10 mg via ORAL
  Filled 2016-08-27 (×7): qty 1

## 2016-08-27 MED ORDER — PROMETHAZINE HCL 25 MG/ML IJ SOLN: 6.2500 mg | INTRAMUSCULAR | Status: DC | PRN

## 2016-08-27 MED ORDER — MEPERIDINE HCL 50 MG/ML IJ SOLN: 6.2500 mg | INTRAMUSCULAR | Status: DC | PRN

## 2016-08-27 MED ORDER — ACETAMINOPHEN 500 MG PO TABS
1000.0000 mg | ORAL_TABLET | ORAL | Status: AC
  Administered 2016-08-27: 1000 mg via ORAL
  Filled 2016-08-27: qty 2

## 2016-08-27 MED ORDER — ROCURONIUM BROMIDE 50 MG/5ML IV SOSY
PREFILLED_SYRINGE | INTRAVENOUS | Status: AC
  Filled 2016-08-27: qty 5

## 2016-08-27 MED ORDER — ROCURONIUM BROMIDE 50 MG/5ML IV SOSY
PREFILLED_SYRINGE | INTRAVENOUS | Status: DC | PRN
  Administered 2016-08-27 (×5): 10 mg via INTRAVENOUS
  Administered 2016-08-27: 20 mg via INTRAVENOUS
  Administered 2016-08-27: 5 mg via INTRAVENOUS

## 2016-08-27 MED ORDER — DEXTROSE 5 % IV SOLN
2.0000 g | INTRAVENOUS | Status: AC
  Administered 2016-08-27: 2 g via INTRAVENOUS

## 2016-08-27 MED ORDER — LACTATED RINGERS IR SOLN
Status: DC | PRN
  Administered 2016-08-27: 3000 mL

## 2016-08-27 MED ORDER — ONDANSETRON HCL 4 MG/2ML IJ SOLN
INTRAMUSCULAR | Status: DC | PRN
  Administered 2016-08-27: 4 mg via INTRAVENOUS

## 2016-08-27 MED ORDER — SUGAMMADEX SODIUM 200 MG/2ML IV SOLN
INTRAVENOUS | Status: DC | PRN
  Administered 2016-08-27: 200 mg via INTRAVENOUS

## 2016-08-27 MED ORDER — DEXAMETHASONE SODIUM PHOSPHATE 10 MG/ML IJ SOLN
INTRAMUSCULAR | Status: AC
  Filled 2016-08-27: qty 1

## 2016-08-27 MED ORDER — LIDOCAINE 2% (20 MG/ML) 5 ML SYRINGE
INTRAMUSCULAR | Status: AC
  Filled 2016-08-27: qty 5

## 2016-08-27 MED ORDER — ONDANSETRON HCL 4 MG/2ML IJ SOLN: 4.0000 mg | Freq: Four times a day (QID) | INTRAMUSCULAR | Status: DC | PRN

## 2016-08-27 MED ORDER — KCL IN DEXTROSE-NACL 20-5-0.45 MEQ/L-%-% IV SOLN
INTRAVENOUS | Status: DC
  Administered 2016-08-27 – 2016-08-30 (×7): via INTRAVENOUS
  Filled 2016-08-27 (×9): qty 1000

## 2016-08-27 MED ORDER — LACTATED RINGERS IV SOLN
INTRAVENOUS | Status: DC | PRN
  Administered 2016-08-27 (×2): via INTRAVENOUS

## 2016-08-27 MED ORDER — ONDANSETRON HCL 4 MG/2ML IJ SOLN
INTRAMUSCULAR | Status: AC
  Filled 2016-08-27: qty 2

## 2016-08-27 MED ORDER — DEXAMETHASONE SODIUM PHOSPHATE 10 MG/ML IJ SOLN
INTRAMUSCULAR | Status: DC | PRN
  Administered 2016-08-27: 10 mg via INTRAVENOUS

## 2016-08-27 MED ORDER — HYDROMORPHONE HCL 2 MG/ML IJ SOLN
0.5000 mg | INTRAMUSCULAR | Status: DC | PRN
  Administered 2016-08-27 – 2016-08-31 (×22): 2 mg via INTRAVENOUS
  Filled 2016-08-27 (×22): qty 1

## 2016-08-27 MED ORDER — FENTANYL CITRATE (PF) 100 MCG/2ML IJ SOLN
INTRAMUSCULAR | Status: DC | PRN
  Administered 2016-08-27 (×2): 50 ug via INTRAVENOUS
  Administered 2016-08-27: 25 ug via INTRAVENOUS
  Administered 2016-08-27: 50 ug via INTRAVENOUS
  Administered 2016-08-27: 25 ug via INTRAVENOUS
  Administered 2016-08-27 (×2): 50 ug via INTRAVENOUS

## 2016-08-27 MED ORDER — PROPOFOL 10 MG/ML IV BOLUS
INTRAVENOUS | Status: DC | PRN
  Administered 2016-08-27: 120 mg via INTRAVENOUS

## 2016-08-27 MED ORDER — LABETALOL HCL 5 MG/ML IV SOLN
INTRAVENOUS | Status: DC | PRN
  Administered 2016-08-27: 5 mg via INTRAVENOUS

## 2016-08-27 MED ORDER — VENLAFAXINE HCL ER 75 MG PO CP24
225.0000 mg | ORAL_CAPSULE | Freq: Every day | ORAL | Status: DC
  Administered 2016-08-27 – 2016-08-31 (×5): 225 mg via ORAL
  Filled 2016-08-27 (×5): qty 1

## 2016-08-27 MED ORDER — DIPHENHYDRAMINE HCL 50 MG/ML IJ SOLN: 12.5000 mg | Freq: Four times a day (QID) | INTRAMUSCULAR | Status: DC | PRN

## 2016-08-27 MED ORDER — SUCCINYLCHOLINE CHLORIDE 200 MG/10ML IV SOSY
PREFILLED_SYRINGE | INTRAVENOUS | Status: AC
  Filled 2016-08-27: qty 10

## 2016-08-27 MED ORDER — PROPOFOL 10 MG/ML IV BOLUS
INTRAVENOUS | Status: AC
  Filled 2016-08-27: qty 20

## 2016-08-27 MED ORDER — LIP MEDEX EX OINT
TOPICAL_OINTMENT | CUTANEOUS | Status: AC
  Administered 2016-08-27: 15:00:00
  Filled 2016-08-27: qty 7

## 2016-08-27 MED ORDER — BUPIVACAINE HCL (PF) 0.25 % IJ SOLN
INTRAMUSCULAR | Status: DC | PRN
  Administered 2016-08-27: 10 mL

## 2016-08-27 MED ORDER — LISINOPRIL 20 MG PO TABS
40.0000 mg | ORAL_TABLET | Freq: Every day | ORAL | Status: DC
  Administered 2016-08-27 – 2016-09-01 (×6): 40 mg via ORAL
  Filled 2016-08-27 (×8): qty 2

## 2016-08-27 MED ORDER — ENOXAPARIN SODIUM 40 MG/0.4ML ~~LOC~~ SOLN
40.0000 mg | SUBCUTANEOUS | Status: DC
  Administered 2016-08-28 – 2016-09-01 (×5): 40 mg via SUBCUTANEOUS
  Filled 2016-08-27 (×5): qty 0.4

## 2016-08-27 MED ORDER — SIMVASTATIN 20 MG PO TABS
40.0000 mg | ORAL_TABLET | Freq: Every day | ORAL | Status: DC
  Administered 2016-08-27 – 2016-08-31 (×5): 40 mg via ORAL
  Filled 2016-08-27 (×5): qty 2

## 2016-08-27 MED ORDER — GABAPENTIN 300 MG PO CAPS
300.0000 mg | ORAL_CAPSULE | ORAL | Status: AC
  Administered 2016-08-27: 300 mg via ORAL
  Filled 2016-08-27: qty 1

## 2016-08-27 MED ORDER — HYDROMORPHONE HCL 2 MG/ML IJ SOLN
0.5000 mg | INTRAMUSCULAR | Status: DC | PRN
  Administered 2016-08-27: 1 mg via INTRAVENOUS
  Filled 2016-08-27: qty 1

## 2016-08-27 MED ORDER — ARTIFICIAL TEARS OP OINT
TOPICAL_OINTMENT | OPHTHALMIC | Status: AC
  Filled 2016-08-27: qty 3.5

## 2016-08-27 MED ORDER — ALVIMOPAN 12 MG PO CAPS
12.0000 mg | ORAL_CAPSULE | Freq: Two times a day (BID) | ORAL | Status: DC
  Administered 2016-08-28 – 2016-08-29 (×3): 12 mg via ORAL
  Filled 2016-08-27 (×3): qty 1

## 2016-08-27 MED ORDER — LIDOCAINE 2% (20 MG/ML) 5 ML SYRINGE
INTRAMUSCULAR | Status: DC | PRN
  Administered 2016-08-27: 60 mg via INTRAVENOUS

## 2016-08-27 MED ORDER — ALVIMOPAN 12 MG PO CAPS
12.0000 mg | ORAL_CAPSULE | Freq: Once | ORAL | Status: AC
  Administered 2016-08-27: 12 mg via ORAL
  Filled 2016-08-27: qty 1

## 2016-08-27 SURGICAL SUPPLY — 74 items
APPLIER CLIP 5 13 M/L LIGAMAX5 (MISCELLANEOUS)
BLADE EXTENDED COATED 6.5IN (ELECTRODE) IMPLANT
CABLE HIGH FREQUENCY MONO STRZ (ELECTRODE) ×3 IMPLANT
CELLS DAT CNTRL 66122 CELL SVR (MISCELLANEOUS) IMPLANT
CHLORAPREP W/TINT 26ML (MISCELLANEOUS) ×3 IMPLANT
CLIP APPLIE 5 13 M/L LIGAMAX5 (MISCELLANEOUS) IMPLANT
COUNTER NEEDLE 20 DBL MAG RED (NEEDLE) ×3 IMPLANT
COVER MAYO STAND STRL (DRAPES) ×9 IMPLANT
COVER SURGICAL LIGHT HANDLE (MISCELLANEOUS) IMPLANT
DECANTER SPIKE VIAL GLASS SM (MISCELLANEOUS) ×3 IMPLANT
DERMABOND ADVANCED (GAUZE/BANDAGES/DRESSINGS) ×2
DERMABOND ADVANCED .7 DNX12 (GAUZE/BANDAGES/DRESSINGS) ×1 IMPLANT
DRAIN CHANNEL 19F RND (DRAIN) IMPLANT
DRAPE LAPAROSCOPIC ABDOMINAL (DRAPES) ×3 IMPLANT
DRAPE SURG IRRIG POUCH 19X23 (DRAPES) ×3 IMPLANT
DRSG OPSITE POSTOP 4X10 (GAUZE/BANDAGES/DRESSINGS) IMPLANT
DRSG OPSITE POSTOP 4X6 (GAUZE/BANDAGES/DRESSINGS) ×3 IMPLANT
DRSG OPSITE POSTOP 4X8 (GAUZE/BANDAGES/DRESSINGS) IMPLANT
DRSG TELFA 3X8 NADH (GAUZE/BANDAGES/DRESSINGS) ×3 IMPLANT
ELECT PENCIL ROCKER SW 15FT (MISCELLANEOUS) ×6 IMPLANT
ELECT REM PT RETURN 15FT ADLT (MISCELLANEOUS) ×3 IMPLANT
EVACUATOR SILICONE 100CC (DRAIN) IMPLANT
GAUZE SPONGE 4X4 12PLY STRL (GAUZE/BANDAGES/DRESSINGS) ×3 IMPLANT
GLOVE BIO SURGEON STRL SZ 6.5 (GLOVE) ×12 IMPLANT
GLOVE BIO SURGEONS STRL SZ 6.5 (GLOVE) ×6
GLOVE BIOGEL PI IND STRL 7.0 (GLOVE) ×4 IMPLANT
GLOVE BIOGEL PI INDICATOR 7.0 (GLOVE) ×8
GOWN STRL REUS W/TWL 2XL LVL3 (GOWN DISPOSABLE) ×6 IMPLANT
GOWN STRL REUS W/TWL XL LVL3 (GOWN DISPOSABLE) ×12 IMPLANT
GRASPER ENDOPATH ANVIL 10MM (MISCELLANEOUS) IMPLANT
HOLDER FOLEY CATH W/STRAP (MISCELLANEOUS) ×3 IMPLANT
IRRIG SUCT STRYKERFLOW 2 WTIP (MISCELLANEOUS) ×3
IRRIGATION SUCT STRKRFLW 2 WTP (MISCELLANEOUS) ×1 IMPLANT
LEGGING LITHOTOMY PAIR STRL (DRAPES) IMPLANT
LUBRICANT JELLY K Y 4OZ (MISCELLANEOUS) ×3 IMPLANT
PACK COLON (CUSTOM PROCEDURE TRAY) ×3 IMPLANT
PAD POSITIONING PINK XL (MISCELLANEOUS) ×3 IMPLANT
PORT LAP GEL ALEXIS MED 5-9CM (MISCELLANEOUS) ×3 IMPLANT
POSITIONER SURGICAL ARM (MISCELLANEOUS) ×3 IMPLANT
RELOAD PROXIMATE 75MM BLUE (ENDOMECHANICALS) ×3 IMPLANT
RTRCTR WOUND ALEXIS 18CM MED (MISCELLANEOUS)
SCISSORS LAP 5X35 DISP (ENDOMECHANICALS) ×3 IMPLANT
SEALER TISSUE G2 STRG ARTC 35C (ENDOMECHANICALS) IMPLANT
SEALER TISSUE X1 CVD JAW (INSTRUMENTS) IMPLANT
SLEEVE XCEL OPT CAN 5 100 (ENDOMECHANICALS) ×3 IMPLANT
SPONGE DRAIN TRACH 4X4 STRL 2S (GAUZE/BANDAGES/DRESSINGS) IMPLANT
SPONGE LAP 18X18 X RAY DECT (DISPOSABLE) ×6 IMPLANT
STAPLER CIRC CVD 29MM 37CM (STAPLE) ×3 IMPLANT
STAPLER GUN LINEAR PROX 60 (STAPLE) ×3 IMPLANT
STAPLER PROXIMATE 75MM BLUE (STAPLE) ×3 IMPLANT
STAPLER VISISTAT 35W (STAPLE) IMPLANT
SUT ETHILON 2 0 PS N (SUTURE) IMPLANT
SUT NOVA NAB DX-16 0-1 5-0 T12 (SUTURE) ×6 IMPLANT
SUT NOVA NAB GS-21 0 18 T12 DT (SUTURE) IMPLANT
SUT PDS AB 1 CTX 36 (SUTURE) IMPLANT
SUT PDS AB 1 TP1 96 (SUTURE) IMPLANT
SUT PROLENE 2 0 KS (SUTURE) ×3 IMPLANT
SUT SILK 2 0 (SUTURE) ×2
SUT SILK 2 0 SH CR/8 (SUTURE) ×3 IMPLANT
SUT SILK 2-0 18XBRD TIE 12 (SUTURE) ×1 IMPLANT
SUT SILK 3 0 (SUTURE) ×2
SUT SILK 3 0 SH CR/8 (SUTURE) ×6 IMPLANT
SUT SILK 3-0 18XBRD TIE 12 (SUTURE) ×1 IMPLANT
SUT VIC AB 2-0 SH 18 (SUTURE) ×3 IMPLANT
SUT VIC AB 4-0 PS2 27 (SUTURE) ×3 IMPLANT
SYS LAPSCP GELPORT 120MM (MISCELLANEOUS)
SYSTEM LAPSCP GELPORT 120MM (MISCELLANEOUS) IMPLANT
TOWEL OR NON WOVEN STRL DISP B (DISPOSABLE) ×3 IMPLANT
TRAY FOLEY W/METER SILVER 16FR (SET/KITS/TRAYS/PACK) IMPLANT
TROCAR BLADELESS OPT 5 100 (ENDOMECHANICALS) ×3 IMPLANT
TROCAR XCEL BLUNT TIP 100MML (ENDOMECHANICALS) IMPLANT
TUBING CONNECTING 10 (TUBING) ×2 IMPLANT
TUBING CONNECTING 10' (TUBING) ×1
TUBING INSUF HEATED (TUBING) ×3 IMPLANT

## 2016-08-27 NOTE — Anesthesia Postprocedure Evaluation (Signed)
Anesthesia Post Note  Patient: Monique [name]  Procedure(s) Performed: Procedure(s) (LRB): LAPAROSCOPIC COLOSTOMY REVERSAL (N/A)  Patient location during evaluation: PACU Anesthesia Type: General Level of consciousness: sedated and patient cooperative Pain management: pain level controlled Vital Signs Assessment: post-procedure vital signs reviewed and stable Respiratory status: spontaneous breathing Cardiovascular status: stable Anesthetic complications: no       Last Vitals:  Vitals:   08/27/16 1621 08/27/16 1834  BP: (!) 172/85 (!) 144/78  Pulse: 98 (!) 108  Resp: 18 16  Temp: 36.7 C 36.7 C    Last Pain:  Vitals:   08/27/16 1834  TempSrc: Oral  PainSc:                  Lewie LoronJohn Kestrel Mis

## 2016-08-27 NOTE — Anesthesia Procedure Notes (Signed)
Procedure Name: Intubation Date/Time: 08/27/2016 7:27 AM Performed by: Dione Booze Pre-anesthesia Checklist: Emergency Drugs available, Suction available, Patient being monitored and Patient identified Patient Re-evaluated:Patient Re-evaluated prior to inductionOxygen Delivery Method: Circle system utilized Preoxygenation: Pre-oxygenation with 100% oxygen Intubation Type: IV induction Laryngoscope Size: Mac and 4 Grade View: Grade I Tube type: Oral Tube size: 7.5 mm Number of attempts: 1 Airway Equipment and Method: Stylet Placement Confirmation: ETT inserted through vocal cords under direct vision,  positive ETCO2 and breath sounds checked- equal and bilateral Secured at: 21 cm Tube secured with: Tape Dental Injury: Teeth and Oropharynx as per pre-operative assessment

## 2016-08-27 NOTE — Progress Notes (Signed)
[facility]  Patient Status: Active  [facility] is providing the following services: OT,PT,SN,HHA  If patient discharges after hours, please call [phone].   Monique [name] 08/27/2016, 2:59 PM

## 2016-08-27 NOTE — Progress Notes (Signed)
Spoke with Dr. Daphine DeutscherMartin in regard to concerns expressed by patient about having withdrawals from her regular pain medicine  she takes for arthritis.  He gave the order to increase amount and frequency of Dilaudid for pain.  I relayed the information given to me by Dr. Daphine DeutscherMartin that with her stomach in its current condition after surgery, it would not be beneficial for her to take the pills.  She agreed and is willing to try the Dilaudid instead.

## 2016-08-27 NOTE — Transfer of Care (Signed)
Immediate Anesthesia Transfer of Care Note  Patient: Monique [name]  Procedure(s) Performed: Procedure(s): LAPAROSCOPIC COLOSTOMY REVERSAL (N/A)  Patient Location: PACU  Anesthesia Type:General  Level of Consciousness: awake, alert , oriented and patient cooperative  Airway & Oxygen Therapy: Patient Spontanous Breathing and Patient connected to face mask oxygen  Post-op Assessment: Report given to RN and Post -op Vital signs reviewed and stable  Post vital signs: Reviewed and stable  Last Vitals:  Vitals:   08/27/16 0546  BP: (!) 155/82  Pulse: (!) 113  Resp: 16  Temp: 37.1 C    Last Pain:  Vitals:   08/27/16 0612  TempSrc:   PainSc: 0-No pain      Patients Stated Pain Goal: 4 (08/27/16 0612)  Complications: No apparent anesthesia complications

## 2016-08-27 NOTE — Op Note (Signed)
08/27/2016  11:22 AM  PATIENT:  Monique [name]  76 y.o. female  Patient Care Team: Elizabeth Palaueresa Anderson, FNP as PCP - General (Nurse Practitioner)  PRE-OPERATIVE DIAGNOSIS:  diverticulitis with colostomy  POST-OPERATIVE DIAGNOSIS:  diverticulitis with colostomy  PROCEDURE:  LAPAROSCOPIC CONVERTED TO OPEN COLOSTOMY REVERSAL SMALL BOWEL RESECTION    Surgeon(s): Romie LeveeAlicia Natasa Stigall, MD Darnell Levelodd Gerkin, MD  ASSISTANT: Dr Gerrit FriendsGerkin   ANESTHESIA:   local and general  EBL: 400ml  Total I/O In: 1000 [I.V.:1000] Out: 500 [Urine:100; Blood:400]  DRAINS: none   SPECIMEN:  Source of Specimen:  colostomy, small bowel  DISPOSITION OF SPECIMEN:  PATHOLOGY  COUNTS:  YES  PLAN OF CARE: Admit to inpatient   PATIENT DISPOSITION:  PACU - hemodynamically stable.  INDICATION: 76 y.o. F with h/o diverticulitis causing SBO.     OR FINDINGS: significant pelvic adhesions  DESCRIPTION: the patient was identified in the preoperative holding area and taken to the OR where they were laid supine on the operating room table.  General anesthesia was induced without difficulty. SCDs were also noted to be in place prior to the initiation of anesthesia.  A Foley was placed under sterile conditions. The patient was then placed in lithotomy position. The patient was then prepped and draped in the usual sterile fashion.   A surgical timeout was performed indicating the correct patient, procedure, positioning and need for preoperative antibiotics.   I began by accessing the abdomen and left upper quadrant using a varies needle. Entry was clean. The abdomen was insufflated. A 5 mm port was placed in the right upper quadrant. Visualization showed no injury at either entrance site. After this was completed additional 5 mm ports were placed and I began to take down the abdominal wall adhesions using sharp dissection. The omentum was swept towards the upper abdomen. I then began to lyse adhesions in the pelvis. There were  several loops of small bowel that were densely adherent to the pelvic sidewall and sacrum. I spent approximately 1 hour lysing adhesions in this region trying to get the small bowel out of the pelvis so that I could identify the rectal stump. Ultimately I was unable to safely remove the small bowel from the pelvis. I decided to reopen the lower midline incision. An Alexis wound protector was placed. The remaining adhesions were bluntly mobilized upwards. There was a piece of small bowel that was nonviable. This was resected using a 75 mm blue load GIA staplers.  The same stapler was used to create an anastomosis and a TA 60 mm blue load stapler was used to close the common enterotomy channel. The mesenteric defect was closed with a running 2-0 silk suture. An anti-extension 3-0 silk suture was placed at the crotch of the anastomosis. This was then placed back into the abdomen. The remainder of the small bowel was evaluated. There were no other sites of abnormality. At this point I packed the small bowel away from the pelvis and began to take down the colostomy. The rectal stump was identified prior to this and appeared patent. I then terminated attention to the patient's colostomy site which are previously been closed with a 2-0 silk suture. An incision was made around this using the cautery. Dissection was carried down around the colostomy site and the fascial junction was divided. The colostomy was freed from the abdominal wall fascia. I then divided the colon proximal at the level of the fascia using electrocautery over a pursestring device. A 2-0 Prolene pursestring was  inserted. A 29 mm EEA anvil was then inserted and the pursestring was tied tightly around this. I then mobilized the colon bluntly to allow for reach into the pelvis. After this was completed a 29 mm EEA stapler was easily inserted into the rectum and brought out through the rectal stump. An anastomosis was created without difficulty. There was  a small leak at the staple line that was clearly visible with insufflation. This was closed using 2 3-0 silk sutures area there was no further leak when retested with insufflation under water. Hemostasis was good. I evaluated the pelvic floor. The right left ureters appeared to be intact and away from our dissection planes. The omentum was then brought down into the pelvis. I inspected the small bowel once more. I closed a small serosal defect using interrupted 3-0 silk sutures. There were no further signs of abnormalities. The small bowel was then placed back into the abdomen. At this point we then switched to clean gowns, gloves, drapes and instruments. I closed the ostomy fascial defect using interrupted 0 Novafil sutures. I did the same to the midline incision. I then reapproximated the subcutaneous tissue of the midline incision using 2-0 Vicryl sutures. A running 4-0 Vicryl suture was used to close the skin of the midline incision as well as the port sites. I used a 2-0 Vicryl pursestring suture 2 to partially close the ostomy defect a Telfa sponge was placed in the middle of this and a dressing was applied. The patient was then awakened from anesthesia and sent to the postanesthesia care unit stable condition. All counts were correct per operating room staff.

## 2016-08-27 NOTE — Interval H&P Note (Signed)
History and Physical Interval Note:  08/27/2016 7:06 AM  Monique [name]  has presented today for surgery, with the diagnosis of diverticulitis with colostomy  The various methods of treatment have been discussed with the patient and family. After consideration of risks, benefits and other options for treatment, the patient has consented to  Procedure(s): LAPAROSCOPIC COLOSTOMY REVERSAL (N/A) as a surgical intervention .  The patient's history has been reviewed, patient examined, no change in status, stable for surgery.  I have reviewed the patient's chart and labs.  Questions were answered to the patient's satisfaction.   The surgery and anatomy were described to the patient as well as the risks of surgery and the possible complications.  These include: Bleeding, deep abdominal infections and possible wound complications such as hernia and infection, damage to adjacent structures, leak of surgical connections, which can lead to other surgeries and possibly an ostomy, possible need for other procedures, such as abscess drains in radiology, possible prolonged hospital stay, possible diarrhea from removal of part of the colon, possible constipation from narcotics, prolonged fatigue/weakness or appetite loss, possible early recurrence of of disease, possible complications of their medical problems such as heart disease or arrhythmias or lung problems, death (less than 1%). I believe the patient understands and wishes to proceed with the surgery.  Vanita PandaAlicia C Gabriell Casimir, MD  Colorectal and General Surgery [facility]

## 2016-08-27 NOTE — H&P (View-Only) (Signed)
The patient is a 76 year old female who presents with diverticulitis. 76 year old female who was hospitalized for small bowel obstruction due to diverticular abscess. This did not resolve with time. She was taken to the operating room in early October for a Hartman's procedure and lysis of adhesions. She was discharged from the hospital approximately 10 days later. Since that time she has recovered with some difficulty. She unfortunately fell out of a car and broke her humerus. She was in the splint for this for the 8 weeks. She had been doing well at home but then her husband passed away suddenly earlier this month. She is having good ostomy function and denies any abdominal pain. She denies any nausea.   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 Hysterectomy (not due to cancer) - Complete Resection of Small Bowel  Diagnostic Studies History Romie Levee(Karington Zarazua, MD; 07/15/2016 2:54 PM) Colonoscopy never Mammogram within last year Pap Smear >5 years ago  Allergies Juanita Craver(Armen Glenn, CMA; 07/14/2016 11:55 AM) Morphine Derivatives anxiety Lyrica *ANTICONVULSANTS*  Medication History (Armen Glenn, CMA; 07/14/2016 11:55 AM) Aspirin (81MG  Tablet, Oral) Active. ZyrTEC (10MG  Tablet, Oral) Active. Furosemide (20MG  Tablet, Oral) Active. Lisinopril (20MG  Tablet, Oral) Active. OxyCODONE HCl (5MG  Tablet, Oral) Active. Pantoprazole Sodium (40MG  Tablet DR, Oral) Active. Polyethylene Glycol 3350 (Oral) Active. Simvastatin (40MG  Tablet, Oral) Active. Venlafaxine HCl ER (75MG  Capsule ER 24HR, Oral) Active. Medications Reconciled  Social History Romie Levee(Rolando Hessling, MD; 07/15/2016 2:54 PM) Alcohol use Occasional alcohol use. No caffeine use No drug use Tobacco use Never  smoker.  Family History Romie Levee(Duey Liller, MD; 07/15/2016 2:54 PM) Cancer Mother. Depression Mother. Heart Disease Father. Heart disease in female family member before age 77 Hypertension Father.  Pregnancy / Birth History Romie Levee(Samuel Rittenhouse, MD; 07/15/2016 2:54 PM) Age at menarche 10 years. Age of menopause 51-55 Contraceptive History Oral contraceptives. Gravida 2 Irregular periods Length (months) of breastfeeding 3-6 Maternal age 77-25 Para 2  Other Problems Romie Levee(Draya Felker, MD; 07/15/2016 2:54 PM) Anxiety Disorder Arthritis Asthma Back Pain Depression Diverticulosis Gastric Ulcer Gastroesophageal Reflux Disease High blood pressure Hypercholesterolemia Thyroid Disease     Review of Systems  General Not Present- Appetite Loss, Chills, Fatigue, Fever, Night Sweats, Weight Gain and Weight Loss. Skin Not Present- Change in Wart/Mole, Dryness, Hives, Jaundice, New Lesions, Non-Healing Wounds, Rash and Ulcer. HEENT Present- Oral Ulcers, Seasonal Allergies and Wears glasses/contact lenses. Not Present- Earache, Hearing Loss, Hoarseness, Nose Bleed, Ringing in the Ears, Sinus Pain, Sore Throat, Visual Disturbances and Yellow Eyes. Respiratory Not Present- Bloody sputum, Chronic Cough, Difficulty Breathing, Snoring and Wheezing. Breast Not Present- Breast Mass, Breast Pain, Nipple Discharge and Skin Changes. Cardiovascular Present- Swelling of Extremities. Not Present- Chest Pain, Difficulty Breathing Lying Down, Leg Cramps, Palpitations, Rapid Heart Rate and Shortness of Breath. Gastrointestinal Present- Nausea. Not Present- Abdominal Pain, Bloating, Bloody Stool, Change in Bowel Habits, Chronic diarrhea, Constipation, Difficulty Swallowing, Excessive gas, Gets full quickly at meals, Hemorrhoids, Indigestion, Rectal Pain and Vomiting. Female Genitourinary Not Present- Frequency, Nocturia, Painful Urination, Pelvic Pain and Urgency. Musculoskeletal Present-  Back Pain, Joint Pain, Joint Stiffness, Muscle Weakness and Swelling of Extremities. Not Present- Muscle Pain. Neurological Present- Weakness. Not Present- Decreased Memory, Fainting, Headaches, Numbness, Seizures, Tingling, Tremor and Trouble walking. Psychiatric Present- Anxiety. Not Present- Bipolar, Change in Sleep Pattern, Depression, Fearful and Frequent crying. Hematology Present- Blood Thinners and  Easy Bruising. Not Present- Excessive bleeding, Gland problems, HIV and Persistent Infections.  BP (!) 160/87   Temp 98.3 F (36.8 C) (Oral)   Resp 15   SpO2 96%    Physical Exam   General Mental Status-Alert. General Appearance-Consistent with stated age. Hydration-Well hydrated. Voice-Normal.  Chest and Lung Exam Chest and lung exam reveals -quiet, even and easy respiratory effort with no use of accessory muscles. Inspection Chest Wall - Normal. Back - normal.  Cardiovascular Cardiovascular examination reveals -normal heart sounds, regular rate and rhythm with no murmurs.  Abdomen Inspection Skin - Scar - Note: Incision: clean, dry, and intact. Palpation/Percussion Palpation and Percussion of the abdomen reveal - Soft, Non Tender, No Rebound tenderness and No Rigidity (guarding). 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 Her ostomy is functioning and she denies any nausea. She is done with her shoulder therapy. I have recommended a colonoscopy prior to surgical ostomy reversal for screening purposes.  Risks if bleeding, perforation and missed pathology were discussed with pt along with potential cardiopulmonary complications from moderate sedation.   These are all minimal and do not outweigh the benefits of the procedure.  I believe she understands this and has agreed to proceed.

## 2016-08-28 LAB — BASIC METABOLIC PANEL
Anion gap: 6 (ref 5–15)
BUN: 18 mg/dL (ref 6–20)
CO2: 27 mmol/L (ref 22–32)
Calcium: 8.1 mg/dL — ABNORMAL LOW (ref 8.9–10.3)
Chloride: 106 mmol/L (ref 101–111)
Creatinine, Ser: 0.44 mg/dL (ref 0.44–1.00)
GFR calc Af Amer: >60 mL/min (ref >60)
GLUCOSE: 173 mg/dL — AB (ref 65–99)
POTASSIUM: 4.3 mmol/L (ref 3.5–5.1)
Sodium: 139 mmol/L (ref 135–145)

## 2016-08-28 LAB — CBC
HEMATOCRIT: 31.9 % — AB (ref 36.0–46.0)
Hemoglobin: 10.1 g/dL — ABNORMAL LOW (ref 12.0–15.0)
MCH: 28.4 pg (ref 26.0–34.0)
MCHC: 31.7 g/dL (ref 30.0–36.0)
MCV: 89.6 fL (ref 78.0–100.0)
Platelets: 152 10*3/uL (ref 150–400)
RBC: 3.56 MIL/uL — ABNORMAL LOW (ref 3.87–5.11)
RDW: 14.9 % (ref 11.5–15.5)
WBC: 9.4 10*3/uL (ref 4.0–10.5)

## 2016-08-28 MED ORDER — ACETAMINOPHEN 500 MG PO TABS
1000.0000 mg | ORAL_TABLET | Freq: Four times a day (QID) | ORAL | Status: DC
  Administered 2016-08-28 – 2016-09-01 (×12): 1000 mg via ORAL
  Filled 2016-08-28 (×14): qty 2

## 2016-08-28 NOTE — Progress Notes (Signed)
Notified Dr. Maisie Fushomas about the NG tube coming out.  She said ok to leave out and keep pt NPO with ice chips and sips of water with meds.

## 2016-08-28 NOTE — Progress Notes (Signed)
1 Day Post-Op Lap converted to open colostomy reversal with SBR Subjective: Feeling ok.  Pain controlled.    Objective: Vital signs in last 24 hours: Temp:  [97.8 F (36.6 C)-98.6 F (37 C)] 98.4 F (36.9 C) (01/26 0536) Pulse Rate:  [85-111] 94 (01/26 0536) Resp:  [14-19] 16 (01/26 0536) BP: (109-172)/(50-96) 115/53 (01/26 0536) SpO2:  [97 %-100 %] 98 % (01/26 0536)   Intake/Output from previous day: 01/25 0701 - 01/26 0700 In: 2675 [I.V.:2675] Out: 1905 [Urine:1255; Emesis/NG output:250; Blood:400] Intake/Output this shift: No intake/output data recorded.   General appearance: alert and cooperative GI: soft, non-distended JP: SS fluid Incision: no significant drainage  Lab Results:   Recent Labs  08/28/16 0457  WBC 9.4  HGB 10.1*  HCT 31.9*  PLT 152   BMET  Recent Labs  08/28/16 0457  NA 139  K 4.3  CL 106  CO2 27  GLUCOSE 173*  BUN 18  CREATININE 0.44  CALCIUM 8.1*   PT/INR No results for input(s): LABPROT, INR in the last 72 hours. ABG No results for input(s): PHART, HCO3 in the last 72 hours.  Invalid input(s): PCO2, PO2  MEDS, Scheduled . acetaminophen  1,000 mg Oral Q6H  . alvimopan  12 mg Oral BID  . enoxaparin (LOVENOX) injection  40 mg Subcutaneous Q24H  . lisinopril  40 mg Oral Daily  . loratadine  10 mg Oral Daily  . pantoprazole  40 mg Oral BID  . simvastatin  40 mg Oral QHS  . venlafaxine XR  225 mg Oral QHS    Studies/Results: No results found.  Assessment: s/p Procedure(s): LAPAROSCOPIC COLOSTOMY REVERSAL Patient Active Problem List   Diagnosis Date Noted  . Colostomy in place [facility](HCC) 08/27/2016  . Malnutrition of moderate degree 05/11/2016  . SBO (small bowel obstruction) 04/26/2016  . Hypertension   . Abscess of peritoneum (HCC) 04/25/2016  . 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 Postop Hypokalemia 07/10/2011  . Primary osteoarthritis of right hip  07/08/2011    Expected post op course  Plan: Cont NG, NPO  Ambulate Incentive spirometer   LOS: 1 day     .Vanita PandaAlicia C Caedyn Raygoza, MD [facility], [place] [phone]   08/28/2016 8:11 AM

## 2016-08-28 NOTE — Evaluation (Signed)
Physical Therapy Evaluation Patient Details Name: Monique [name] MRN: [id number] DOB: 1940-12-14 Today's Date: 08/28/2016   History of Present Illness  76 yo female s/p SB resection, lap converted to open colostomy reversal 08/27/16. 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 She walked ~1000 feet while pushing IV pole. Pt tolerated activity well with minimal pain. Upon sitting EOB, NG tube became dislodged from pt's nose-RN made aware. Recommend ambulation in hallway with nursing supervision. Will follow and continue to practice bed mobility and working towards functional independence in preparation for pt discharging home alone.     Follow Up Recommendations Home health PT; Intermittent Supervision/assist    Equipment Recommendations  None recommended by PT    Recommendations for Other Services       Precautions / Restrictions Precautions Precautions: Fall Precaution Comments: NPO. Colostomy, NG tube (came dislodged during session) Restrictions Weight Bearing Restrictions: No      Mobility  Bed Mobility Overal bed mobility: Needs Assistance Bed Mobility: Supine to Sit;Sit to Sidelying     Supine to sit: HOB elevated;Min assist   Sit to sidelying: Min assist General bed mobility comments: Handheld assist for trunk to upright. Assist for LEs back onto bed.   Transfers Overall transfer level: Needs assistance   Transfers: Sit to/from Stand Sit to Stand: Min guard         General transfer comment: close guard for safety.   Ambulation/Gait Ambulation/Gait assistance: Min guard Ambulation Distance (Feet): 1000 Feet Assistive device:  (IV pole) Gait Pattern/deviations: Step-through pattern;Decreased stride length     General Gait Details: close guard for safety.   Stairs            Wheelchair Mobility    Modified Rankin (Stroke Patients Only)       Balance                                              Pertinent Vitals/Pain Pain Assessment: 0-10 Pain Score: 3  Pain Location: abdomen Pain Descriptors / Indicators: Sore Pain Intervention(s): Monitored during session;Repositioned    Home Living Family/patient expects to be discharged to:: Private residence Living Arrangements: Alone   Type of Home: House Home Access: Ramped entrance     Home Layout: One level Home Equipment: Environmental consultant - 2 wheels;Bedside commode;Cane - single point      Prior Function Level of Independence: Independent               Hand Dominance        Extremity/Trunk Assessment   Upper Extremity Assessment Upper Extremity Assessment: Generalized weakness (Hx of R shoulder fx-soon to begin rehab per pt)    Lower Extremity Assessment Lower Extremity Assessment: Generalized weakness    Cervical / Trunk Assessment Cervical / Trunk Assessment: Normal  Communication   Communication: No difficulties  Cognition Arousal/Alertness: Awake/alert Behavior During Therapy: WFL for tasks assessed/performed Overall Cognitive Status: Within Functional Limits for tasks assessed                      General Comments      Exercises     Assessment/Plan    PT Assessment Patient needs continued PT services  PT Problem List Decreased mobility;Pain;Decreased strength          PT  Treatment Interventions Gait training;Therapeutic activities;Therapeutic exercise;Patient/family education;Functional mobility training    PT Goals (Current goals can be found in the Care Plan section)  Acute Rehab PT Goals Patient Stated Goal: home. regain PLOF. PT Goal Formulation: With patient Time For Goal Achievement: 09/11/16 Potential to Achieve Goals: Good    Frequency Min 2X/week   Barriers to discharge        Co-evaluation               End of Session   Activity Tolerance: Patient tolerated treatment well Patient left: in bed;with  call bell/phone within reach           Time: 1527-1558 PT Time Calculation (min) (ACUTE ONLY): 31 min   Charges:   PT Evaluation $PT Eval Low Complexity: 1 Procedure PT Treatments $Gait Training: 8-22 mins   PT G Codes:        Monique [name], MPT Pager: [phone]

## 2016-08-29 LAB — BASIC METABOLIC PANEL
ANION GAP: 7 (ref 5–15)
BUN: 14 mg/dL (ref 6–20)
CO2: 27 mmol/L (ref 22–32)
Calcium: 8.2 mg/dL — ABNORMAL LOW (ref 8.9–10.3)
Chloride: 106 mmol/L (ref 101–111)
Creatinine, Ser: 0.34 mg/dL — ABNORMAL LOW (ref 0.44–1.00)
GFR calc Af Amer: >60 mL/min (ref >60)
GLUCOSE: 116 mg/dL — AB (ref 65–99)
POTASSIUM: 4 mmol/L (ref 3.5–5.1)
Sodium: 140 mmol/L (ref 135–145)

## 2016-08-29 LAB — CBC
HEMATOCRIT: 30.5 % — AB (ref 36.0–46.0)
HEMOGLOBIN: 9.8 g/dL — AB (ref 12.0–15.0)
MCH: 29.2 pg (ref 26.0–34.0)
MCHC: 32.1 g/dL (ref 30.0–36.0)
MCV: 90.8 fL (ref 78.0–100.0)
Platelets: 140 10*3/uL — ABNORMAL LOW (ref 150–400)
RBC: 3.36 MIL/uL — ABNORMAL LOW (ref 3.87–5.11)
RDW: 14.9 % (ref 11.5–15.5)
WBC: 7.2 10*3/uL (ref 4.0–10.5)

## 2016-08-29 NOTE — Progress Notes (Signed)
Assessment Active Problems:   S/p open colostomy reversal and small bowel resection 08/27/16-no bowel function yet; tolerating ng tube being out.    Plan:  Wait for return of bowel function before starting diet.   LOS: 2 days     2 Days Post-Op  Subjective: Walking a lot no BM or flatus.  Objective: Vital signs in last 24 hours: Temp:  [98.1 F (36.7 C)-99 F (37.2 C)] 98.1 F (36.7 C) (01/27 0646) Pulse Rate:  [94-104] 104 (01/27 0646) Resp:  [16-18] 18 (01/27 0646) BP: (147-156)/(67-87) 156/83 (01/27 0646) SpO2:  [94 %-98 %] 95 % (01/27 0646) Last BM Date: 08/26/16  Intake/Output from previous day: 01/26 0701 - 01/27 0700 In: 1725 [I.V.:1725] Out: 850 [Urine:850] Intake/Output this shift: No intake/output data recorded.  PE: General- In NAD Abdomen-soft, midline wound clean, colostomy site with wick in and no erythema, few bowel sounds  Lab Results:   Recent Labs  08/28/16 0457 08/29/16 0529  WBC 9.4 7.2  HGB 10.1* 9.8*  HCT 31.9* 30.5*  PLT 152 140*   BMET  Recent Labs  08/28/16 0457 08/29/16 0529  NA 139 140  K 4.3 4.0  CL 106 106  CO2 27 27  GLUCOSE 173* 116*  BUN 18 14  CREATININE 0.44 0.34*  CALCIUM 8.1* 8.2*   PT/INR No results for input(s): LABPROT, INR in the last 72 hours. Comprehensive Metabolic Panel:    Component Value Date/Time   NA 140 08/29/2016 0529   NA 139 08/28/2016 0457   K 4.0 08/29/2016 0529   K 4.3 08/28/2016 0457   CL 106 08/29/2016 0529   CL 106 08/28/2016 0457   CO2 27 08/29/2016 0529   CO2 27 08/28/2016 0457   BUN 14 08/29/2016 0529   BUN 18 08/28/2016 0457   CREATININE 0.34 (L) 08/29/2016 0529   CREATININE 0.44 08/28/2016 0457   GLUCOSE 116 (H) 08/29/2016 0529   GLUCOSE 173 (H) 08/28/2016 0457   CALCIUM 8.2 (L) 08/29/2016 0529   CALCIUM 8.1 (L) 08/28/2016 0457   AST 18 05/06/2016 0731   AST 19 04/21/2016 1735   ALT 29 05/06/2016 0731   ALT 23 04/21/2016 1735   ALKPHOS 68 05/06/2016 0731   ALKPHOS 60  04/21/2016 1735   BILITOT 0.8 05/06/2016 0731   BILITOT 1.2 04/21/2016 1735   PROT 6.3 (L) 05/06/2016 0731   PROT 6.9 04/21/2016 1735   ALBUMIN 3.5 05/06/2016 0731   ALBUMIN 3.5 04/21/2016 1735     Studies/Results: No results found.  Anti-infectives: Anti-infectives    Start     Dose/Rate Route Frequency Ordered Stop   08/27/16 2000  cefoTEtan (CEFOTAN) 2 g in dextrose 5 % 50 mL IVPB     2 g 100 mL/hr over 30 Minutes Intravenous Every 12 hours 08/27/16 1410 08/27/16 2031   08/27/16 0544  cefoTEtan (CEFOTAN) 2 g in dextrose 5 % 50 mL IVPB     2 g 100 mL/hr over 30 Minutes Intravenous On call to O.R. 08/27/16 0544 08/27/16 0735       Killian Ress J 08/29/2016

## 2016-08-30 LAB — CBC
HCT: 30.4 % — ABNORMAL LOW (ref 36.0–46.0)
Hemoglobin: 9.8 g/dL — ABNORMAL LOW (ref 12.0–15.0)
MCH: 28.7 pg (ref 26.0–34.0)
MCHC: 32.2 g/dL (ref 30.0–36.0)
MCV: 88.9 fL (ref 78.0–100.0)
PLATELETS: 174 10*3/uL (ref 150–400)
RBC: 3.42 MIL/uL — AB (ref 3.87–5.11)
RDW: 14.6 % (ref 11.5–15.5)
WBC: 6.8 10*3/uL (ref 4.0–10.5)

## 2016-08-30 LAB — BASIC METABOLIC PANEL
Anion gap: 7 (ref 5–15)
BUN: 7 mg/dL (ref 6–20)
CHLORIDE: 106 mmol/L (ref 101–111)
CO2: 26 mmol/L (ref 22–32)
CREATININE: 0.34 mg/dL — AB (ref 0.44–1.00)
Calcium: 8.2 mg/dL — ABNORMAL LOW (ref 8.9–10.3)
Glucose, Bld: 101 mg/dL — ABNORMAL HIGH (ref 65–99)
Potassium: 4.1 mmol/L (ref 3.5–5.1)
SODIUM: 139 mmol/L (ref 135–145)

## 2016-08-30 NOTE — Progress Notes (Signed)
3 Days Post-Op  Subjective: Several bowel movements No nausea or vomiting  Objective: Vital signs in last 24 hours: Temp:  [97.8 F (36.6 C)-98.8 F (37.1 C)] 97.8 F (36.6 C) (01/28 0520) Pulse Rate:  [95-102] 98 (01/28 0520) Resp:  [18] 18 (01/28 0520) BP: (129-157)/(56-70) 129/65 (01/28 0520) SpO2:  [95 %-99 %] 98 % (01/28 0520) Last BM Date: 08/29/16  Intake/Output from previous day: 01/27 0701 - 01/28 0700 In: 1860 [P.O.:60; I.V.:1800] Out: -  Intake/Output this shift: No intake/output data recorded.  General appearance: alert, cooperative and no distress Resp: clear to auscultation bilaterally Cardio: regular rate and rhythm, S1, S2 normal, no murmur, click, rub or gallop GI: soft, minimally distended; active bowel sounds Incision c/d/i; Ostomy site - Penrose with some old bloody drainage  Lab Results:   Recent Labs  08/29/16 0529 08/30/16 0511  WBC 7.2 6.8  HGB 9.8* 9.8*  HCT 30.5* 30.4*  PLT 140* 174   BMET  Recent Labs  08/29/16 0529 08/30/16 0511  NA 140 139  K 4.0 4.1  CL 106 106  CO2 27 26  GLUCOSE 116* 101*  BUN 14 7  CREATININE 0.34* 0.34*  CALCIUM 8.2* 8.2*   PT/INR No results for input(s): LABPROT, INR in the last 72 hours. ABG No results for input(s): PHART, HCO3 in the last 72 hours.  Invalid input(s): PCO2, PO2  Studies/Results: No results found.  Anti-infectives: Anti-infectives    Start     Dose/Rate Route Frequency Ordered Stop   08/27/16 2000  cefoTEtan (CEFOTAN) 2 g in dextrose 5 % 50 mL IVPB     2 g 100 mL/hr over 30 Minutes Intravenous Every 12 hours 08/27/16 1410 08/27/16 2031   08/27/16 0544  cefoTEtan (CEFOTAN) 2 g in dextrose 5 % 50 mL IVPB     2 g 100 mL/hr over 30 Minutes Intravenous On call to O.R. 08/27/16 0544 08/27/16 0735      Assessment/Plan: S/p open colostomy reversal/ SB resection Recovered bowel function  Advance to clear liquids Encourage ambulation   LOS: 3 days    Ector Laurel  K. 08/30/2016

## 2016-08-31 MED ORDER — SODIUM CHLORIDE 0.9 % IV SOLN: 250.0000 mL | INTRAVENOUS | Status: DC | PRN

## 2016-08-31 MED ORDER — OXYCODONE-ACETAMINOPHEN 5-325 MG PO TABS
1.0000 | ORAL_TABLET | ORAL | Status: DC | PRN
  Administered 2016-08-31 – 2016-09-01 (×4): 1 via ORAL
  Filled 2016-08-31 (×5): qty 1

## 2016-08-31 MED ORDER — OXYCODONE-ACETAMINOPHEN 10-325 MG PO TABS: 1.0000 | ORAL_TABLET | ORAL | Status: DC | PRN

## 2016-08-31 MED ORDER — OXYCODONE HCL 5 MG PO TABS
5.0000 mg | ORAL_TABLET | ORAL | Status: DC | PRN
  Administered 2016-08-31 – 2016-09-01 (×6): 5 mg via ORAL
  Filled 2016-08-31 (×6): qty 1

## 2016-08-31 MED ORDER — SODIUM CHLORIDE 0.9% FLUSH
3.0000 mL | INTRAVENOUS | Status: DC | PRN
Start: 2016-08-31 — End: 2016-09-01

## 2016-08-31 MED ORDER — SODIUM CHLORIDE 0.9% FLUSH
3.0000 mL | Freq: Two times a day (BID) | INTRAVENOUS | Status: DC
Start: 2016-08-31 — End: 2016-09-01
  Administered 2016-08-31 – 2016-09-01 (×2): 3 mL via INTRAVENOUS

## 2016-08-31 NOTE — Progress Notes (Signed)
4 Days Post-Op Lap converted to open colostomy reversal with SBR Subjective: Feeling ok.  Pain controlled.  Having bowel function.  No nausea  Objective: Vital signs in last 24 hours: Temp:  [97.9 F (36.6 C)-98.3 F (36.8 C)] 97.9 F (36.6 C) (01/29 0444) Pulse Rate:  [101-104] 101 (01/29 0444) Resp:  [18] 18 (01/29 0444) BP: (135-160)/(67-79) 160/67 (01/29 0444) SpO2:  [97 %-98 %] 97 % (01/29 0444)   Intake/Output from previous day: 01/28 0701 - 01/29 0700 In: 2520 [P.O.:720; I.V.:1800] Out: 1750 [Urine:1750] Intake/Output this shift: No intake/output data recorded.   General appearance: alert and cooperative GI: soft, non-distended  Incision: no significant drainage  Lab Results:   Recent Labs  08/29/16 0529 08/30/16 0511  WBC 7.2 6.8  HGB 9.8* 9.8*  HCT 30.5* 30.4*  PLT 140* 174   BMET  Recent Labs  08/29/16 0529 08/30/16 0511  NA 140 139  K 4.0 4.1  CL 106 106  CO2 27 26  GLUCOSE 116* 101*  BUN 14 7  CREATININE 0.34* 0.34*  CALCIUM 8.2* 8.2*   PT/INR No results for input(s): LABPROT, INR in the last 72 hours. ABG No results for input(s): PHART, HCO3 in the last 72 hours.  Invalid input(s): PCO2, PO2  MEDS, Scheduled . acetaminophen  1,000 mg Oral Q6H  . enoxaparin (LOVENOX) injection  40 mg Subcutaneous Q24H  . lisinopril  40 mg Oral Daily  . loratadine  10 mg Oral Daily  . pantoprazole  40 mg Oral BID  . simvastatin  40 mg Oral QHS  . sodium chloride flush  3 mL Intravenous Q12H  . venlafaxine XR  225 mg Oral QHS    Studies/Results: No results found.  Assessment: s/p Procedure(s): LAPAROSCOPIC COLOSTOMY REVERSAL Patient Active Problem List   Diagnosis Date Noted  . Colostomy in place [facility](HCC) 08/27/2016  . Malnutrition of moderate degree 05/11/2016  . SBO (small bowel obstruction) 04/26/2016  . Hypertension   . Abscess of peritoneum (HCC) 04/25/2016  . 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 Postop Hypokalemia 07/10/2011  . Primary osteoarthritis of right hip 07/08/2011    Expected post op course  Plan: Advance diet to soft foods Ambulate Incentive spirometer Saline lock IVF's PO pain meds    LOS: 4 days     .Vanita PandaAlicia C Leveda Kendrix, MD [facility], [place] [phone]   08/31/2016 8:10 AM

## 2016-08-31 NOTE — Progress Notes (Signed)
Physical Therapy Treatment Patient Details Name: Monique [name] MRN: [id number] DOB: 11/22/1940 Today's Date: 08/31/2016    History of Present Illness 76 yo female s/p SB resection, lap converted to open colostomy reversal 08/27/16. 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  Recent ABD surgery and "bad" R shoulder, pt required 25% VC's on proper tech and strategy.  Assisted with amb in hallway, no AD needed.  Has a cane and walker at home if needed. Pt progressing well.     Follow Up Recommendations  Home health PT     Equipment Recommendations  None recommended by PT    Recommendations for Other Services       Precautions / Restrictions Precautions Precautions: Fall Precaution Comments: colostomy, bad R shoulder Restrictions Weight Bearing Restrictions: No    Mobility  Bed Mobility Overal bed mobility: Needs Assistance Bed Mobility: Supine to Sit;Sit to Sidelying;Sidelying to Sit (flat bed)   Sidelying to sit: Supervision;Min guard Supine to sit: Supervision;Min guard Sit to supine: Supervision;Min guard Sit to sidelying: Supervision;Min guard General bed mobility comments: 25% VC's on proper tech "log roll" and increase use of L UE.  Pt also has a bed side table on her L sge uses.    Transfers Overall transfer level: Needs assistance Equipment used: None Transfers: Sit to/from UGI CorporationStand;Stand Pivot Transfers Sit to Stand: Min guard Stand pivot transfers: Min guard       General transfer comment: close guard for safety.   Ambulation/Gait Ambulation/Gait assistance: Min guard Ambulation Distance (Feet): 350 Feet Assistive device: None Gait Pattern/deviations: Step-through pattern;Decreased stride length Gait velocity: decreased   General Gait Details: close guard for safety.   NO LOB   Stairs            Wheelchair Mobility     Modified Rankin (Stroke Patients Only)       Balance                                    Cognition Arousal/Alertness: Awake/alert Behavior During Therapy: WFL for tasks assessed/performed Overall Cognitive Status: Within Functional Limits for tasks assessed                      Exercises      General Comments        Pertinent Vitals/Pain Pain Assessment: Faces Faces Pain Scale: Hurts a little bit Pain Location: abdomen Pain Descriptors / Indicators: Sore Pain Intervention(s): Monitored during session    Home Living                      Prior Function            PT Goals (current goals can now be found in the care plan section) Progress towards PT goals: Progressing toward goals    Frequency    Min 2X/week      PT Plan Current plan remains appropriate    Co-evaluation             End of Session Equipment Utilized During Treatment: Gait belt Activity Tolerance: Patient tolerated treatment well Patient left: in bed;with call bell/phone within reach     Time: 1132-1159 PT Time Calculation (min) (ACUTE ONLY): 27 min  Charges:  $Gait Training: 8-22 mins $Therapeutic Activity: 8-22  mins                    G Codes:      Rica Koyanagi  PTA [facility]  Acute  Rehab Pager      [phone]

## 2016-09-01 MED ORDER — OXYCODONE-ACETAMINOPHEN 10-325 MG PO TABS: 1.0000 | ORAL_TABLET | ORAL | 0 refills | Status: DC | PRN

## 2016-09-01 NOTE — Discharge Summary (Signed)
Physician Discharge Summary  Patient ID: Monique [name] MRN: [id number] DOB/AGE: 77/09/1940 77 y.o.  Admit date: 08/27/2016 Discharge date: 09/01/2016  Admission Diagnoses: Monique  Discharge Diagnoses:  Active Problems:   Monique in place [facility](HCC)   Discharged Condition: good  Hospital Course: Pt was admitted to the floor.  Her diet was advanced as her bowel function returned.  She was discharged when ambulating without difficulty, tolerating a diet, urinating well and when her pain was controlled with po meds.    Consults: None  Significant Diagnostic Studies: labs: cbc, chemistry  Treatments: IV hydration  Discharge Exam: Blood pressure (!) 162/80, pulse 95, temperature 97.8 F (36.6 C), temperature source Oral, resp. rate 16, height 5' 5.5" (1.664 m), weight 81.2 kg (179 lb), SpO2 98 %. General appearance: alert and cooperative  Abd: soft, non-distended Incisions: clean, dry, intact  Disposition: 01-Home or Self Care   Allergies as of 09/01/2016      Reactions   Morphine And Related Anxiety   Pregabalin    anxiety      Medication List    TAKE these medications   aspirin EC 81 MG tablet Take 81 mg by mouth daily.   cetirizine 10 MG tablet Commonly known as:  ZYRTEC Take 10 mg by mouth at bedtime.   lisinopril 40 MG tablet Commonly known as:  PRINIVIL,ZESTRIL Take 40 mg by mouth daily.   oxyCODONE-acetaminophen 10-325 MG tablet Commonly known as:  PERCOCET Take 1 tablet by mouth every 4 (four) hours as needed for pain.   pantoprazole 40 MG tablet Commonly known as:  PROTONIX Take 1 tablet (40 mg total) by mouth 2 (two) times daily before a meal.   polyethylene glycol packet Commonly known as:  MIRALAX / GLYCOLAX Take 17 g by mouth 2 (two) times daily. What changed:  when to take this   simvastatin 40 MG tablet Commonly known as:  ZOCOR Take 40 mg by mouth at bedtime.   venlafaxine XR 75 MG 24 hr capsule Commonly known as:  EFFEXOR-XR Take  225 mg by mouth at bedtime.      Follow-up Information    Vanita PandaHOMAS, Caroleen Stoermer C., MD. Schedule an appointment as soon as possible for a visit in 2 week(s).   Specialty:  General Surgery Contact information: Monique [name]           Signed: Vanita PandaHOMAS, Zigmond Trela C. 09/01/2016, 8:34 AM

## 2016-09-01 NOTE — Progress Notes (Signed)
See  Shadow chart for complete AVS.  Discharge instructions given to patient along with prescription.  Questions answered

## 2016-09-01 NOTE — Progress Notes (Signed)
5 Days Post-Op Lap converted to open colostomy reversal with SBR Subjective: Feeling ok.  Pain controlled.  Having bowel function.  Tolerating solid diet and having bowel function  Objective: Vital signs in last 24 hours: Temp:  [97.8 F (36.6 C)-99.1 F (37.3 C)] 97.8 F (36.6 C) (01/30 0532) Pulse Rate:  [89-103] 95 (01/30 0532) Resp:  [16-18] 16 (01/30 0532) BP: (130-184)/(66-85) 162/80 (01/30 0532) SpO2:  [96 %-98 %] 98 % (01/30 0532)   Intake/Output from previous day: 01/29 0701 - 01/30 0700 In: 1080 [P.O.:1080] Out: 2000 [Urine:2000] Intake/Output this shift: No intake/output data recorded.   General appearance: alert and cooperative GI: soft, non-distended  Incision: no significant drainage  Lab Results:   Recent Labs  08/30/16 0511  WBC 6.8  HGB 9.8*  HCT 30.4*  PLT 174   BMET  Recent Labs  08/30/16 0511  NA 139  K 4.1  CL 106  CO2 26  GLUCOSE 101*  BUN 7  CREATININE 0.34*  CALCIUM 8.2*   PT/INR No results for input(s): LABPROT, INR in the last 72 hours. ABG No results for input(s): PHART, HCO3 in the last 72 hours.  Invalid input(s): PCO2, PO2  MEDS, Scheduled . acetaminophen  1,000 mg Oral Q6H  . enoxaparin (LOVENOX) injection  40 mg Subcutaneous Q24H  . lisinopril  40 mg Oral Daily  . loratadine  10 mg Oral Daily  . pantoprazole  40 mg Oral BID  . simvastatin  40 mg Oral QHS  . sodium chloride flush  3 mL Intravenous Q12H  . venlafaxine XR  225 mg Oral QHS    Studies/Results: No results found.  Assessment: s/p Procedure(s): LAPAROSCOPIC COLOSTOMY REVERSAL Patient Active Problem List   Diagnosis Date Noted  . Colostomy in place [facility](HCC) 08/27/2016  . Hypertension   . Primary osteoarthritis of right hip 07/08/2011    Expected post op course  Plan: Cont soft foods Ambulate Incentive spirometer PO pain meds D/C this afternoon or tom am at the latest depending on home care situation   LOS: 5 days     .Vanita PandaAlicia C Parker Sawatzky,  MD [facility], [place] [phone]   09/01/2016 8:36 AM

## 2016-09-02 LAB — TYPE AND SCREEN
ABO/RH(D): A POS
Antibody Screen: NEGATIVE

## 2016-10-07 ENCOUNTER — Other Ambulatory Visit: Payer: Self-pay | Admitting: Pain Medicine

## 2017-08-04 ENCOUNTER — Ambulatory Visit: Payer: Medicare Other | Admitting: Cardiovascular Disease

## 2017-08-11 ENCOUNTER — Ambulatory Visit: Payer: Medicare Other | Admitting: Cardiovascular Disease

## 2017-08-20 ENCOUNTER — Encounter: Payer: Self-pay | Admitting: Cardiovascular Disease

## 2017-08-20 ENCOUNTER — Ambulatory Visit (INDEPENDENT_AMBULATORY_CARE_PROVIDER_SITE_OTHER): Payer: Medicare Other | Admitting: Cardiovascular Disease

## 2017-08-20 ENCOUNTER — Encounter (INDEPENDENT_AMBULATORY_CARE_PROVIDER_SITE_OTHER): Payer: Self-pay

## 2017-08-20 VITALS — BP 130/80 | HR 100 | Ht 65.5 in | Wt 184.6 lb

## 2017-08-20 DIAGNOSIS — I10 Essential (primary) hypertension: Secondary | ICD-10-CM | POA: Diagnosis not present

## 2017-08-20 DIAGNOSIS — E785 Hyperlipidemia, unspecified: Secondary | ICD-10-CM | POA: Insufficient documentation

## 2017-08-20 DIAGNOSIS — R000 Tachycardia, unspecified: Secondary | ICD-10-CM | POA: Diagnosis not present

## 2017-08-20 NOTE — Assessment & Plan Note (Signed)
History of hyperlipidemia on statin therapy followed by her PCP. 

## 2017-08-20 NOTE — Assessment & Plan Note (Signed)
History of essential hypertension blood pressure is 130/80. She is on lisinopril. Continue current meds at current dosing.

## 2017-08-20 NOTE — Assessment & Plan Note (Signed)
Monique [name]  was referred by Elizabeth Palaueresa Anderson NP for asymptomatic sinus tachycardia. Her heart rate is 100. She is completely symptomatic from this. I am going to obtain a CBC and thyroid function test as well as a 2-D echo to further evaluate.

## 2017-08-20 NOTE — Patient Instructions (Signed)
Medication Instructions: Your physician recommends that you continue on your current medications as directed. Please refer to the Current Medication list given to you today.  Labwork: Your physician recommends that you return for lab work today: CBC, BMET   Testing/Procedures: Your physician has requested that you have an echocardiogram. Echocardiography is a painless test that uses sound waves to create images of your heart. It provides your doctor with information about the size and shape of your heart and how well your heart's chambers and valves are working. This procedure takes approximately one hour. There are no restrictions for this procedure.  Follow-Up: Your physician wants you to follow-up in: 1 year with Dr. Allyson SabalBerry. You will receive a reminder letter in the mail two months in advance. If you don't receive a letter, please call our office to schedule the follow-up appointment.  If you need a refill on your cardiac medications before your next appointment, please call your pharmacy.

## 2017-08-20 NOTE — Progress Notes (Signed)
08/20/2017 Monique [name]   09-May-1941  [id number]  Primary Physician [name], FNP Primary Cardiologist: Runell Gess MD Nicholes Calamity, [place]  HPI:  Monique [name] is a 77 y.o. moderately overweight widowed Caucasian female mother of 2, grandmother and 2 grandchildren's husband Monique [name] was a long-term patient of mine who died 2016/08/08. 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 She is referred by [name] nurse practitioner for a symptomatic sinus tachycardia. Her cardiac risk factors include treated hypertension and hyperlipidemia patient is a family history of heart disease with father who died of a myocardial function and age 35. She has never had a heart attack or stroke. She denies chest pain or shortness of breath. She does exercise and does yoga as well. She has reactive airways disease. She is noted to be tachycardic by her PCP was referred here for further evaluation.   Current Meds  Medication Sig  . aspirin EC 81 MG tablet Take 81 mg by mouth daily.  . cetirizine (ZYRTEC) 10 MG tablet Take 10 mg by mouth at bedtime.   Marland Kitchen lisinopril (PRINIVIL,ZESTRIL) 40 MG tablet Take 40 mg by mouth daily.  Marland Kitchen oxyCODONE-acetaminophen (PERCOCET) 10-325 MG tablet Take 1 tablet by mouth every 4 (four) hours as needed for pain. (Patient taking differently: Take 0.5 tablets by mouth every 8 (eight) hours as needed for pain. )  . pantoprazole (PROTONIX) 40 MG tablet Take 1 tablet (40 mg total) by mouth 2 (two) times daily before a meal.  . polyethylene glycol (MIRALAX / GLYCOLAX) packet Take 17 g by mouth 2 (two) times daily. (Patient taking differently: Take 17 g by mouth daily. )  . simvastatin (ZOCOR) 40 MG tablet Take 40 mg by mouth at bedtime.   Marland Kitchen venlafaxine XR (EFFEXOR-XR) 75 MG 24 hr capsule Take 225 mg by mouth at bedtime.      Allergies  Allergen Reactions  . Morphine And Related Anxiety  . Pregabalin     anxiety    Social History    Socioeconomic History  . Marital status: Widowed    Spouse name: Not on file  . Number of children: Not on file  . Years of education: Not on file  . Highest education level: Not on file  Social Needs  . Financial resource strain: Not on file  . Food insecurity - worry: Not on file  . Food insecurity - inability: Not on file  . Transportation needs - medical: Not on file  . Transportation needs - non-medical: Not on file  Occupational History  . Not on file  Tobacco Use  . Smoking status: Never Smoker  . Smokeless tobacco: Never Used  Substance and Sexual Activity  . Alcohol use: Yes    Comment: socially- 2 x year  . Drug use: No  . Sexual activity: Not on file  Other Topics Concern  . Not on file  Social History Narrative  . Not on file     Review of Systems: General: negative for chills, fever, night sweats or weight changes.  Cardiovascular: negative for chest pain, dyspnea on exertion, edema, orthopnea, palpitations, paroxysmal nocturnal dyspnea or shortness of breath Dermatological: negative for rash Respiratory: negative for cough or wheezing Urologic: negative for hematuria Abdominal: negative for nausea, vomiting, diarrhea, bright red blood per rectum, melena, or hematemesis Neurologic: negative for visual changes, syncope, or dizziness All other systems reviewed and are otherwise negative except as noted above.    Blood pressure 130/80,  pulse 100, height 5' 5.5" (1.664 m), weight 184 lb 9.6 oz (83.7 kg).  General appearance: alert and no distress Neck: no adenopathy, no carotid bruit, no JVD, supple, symmetrical, trachea midline and thyroid not enlarged, symmetric, no tenderness/mass/nodules Lungs: clear to auscultation bilaterally Heart: regular rate and rhythm, S1, S2 normal, no murmur, click, rub or gallop Extremities: extremities normal, atraumatic, no cyanosis or edema Pulses: 2+ and symmetric Skin: Skin color, texture, turgor normal. No rashes or  lesions Neurologic: Alert and oriented X 3, normal strength and tone. Normal symmetric reflexes. Normal coordination and gait  EKG sinus tachycardia 100 with nonspecific ST and T-wave changes.I  Personally reviewed this EKG.  ASSESSMENT AND PLAN:   Hypertension History of essential hypertension blood pressure is 130/80. She is on lisinopril. Continue current meds at current dosing.  Hyperlipidemia History of hyperlipidemia on statin therapy followed by her PCP  Sinus tachycardia Monique [name]  was referred by Elizabeth Palaueresa Anderson NP for asymptomatic sinus tachycardia. Her heart rate is 100. She is completely symptomatic from this. I am going to obtain a CBC and thyroid function test as well as a 2-D echo to further evaluate.      Runell GessJonathan J. Satoshi Kalas MD FACP,FACC,FAHA, [facility] 08/20/2017 12:06 PM

## 2017-08-21 LAB — BASIC METABOLIC PANEL
BUN / CREAT RATIO: 39 — AB (ref 12–28)
BUN: 30 mg/dL — AB (ref 8–27)
CO2: 21 mmol/L (ref 20–29)
CREATININE: 0.77 mg/dL (ref 0.57–1.00)
Calcium: 9.7 mg/dL (ref 8.7–10.3)
Chloride: 101 mmol/L (ref 96–106)
GFR calc Af Amer: 86 mL/min/{1.73_m2} (ref >59)
GFR, EST NON AFRICAN AMERICAN: 75 mL/min/{1.73_m2} (ref >59)
Glucose: 112 mg/dL — ABNORMAL HIGH (ref 65–99)
Potassium: 4.4 mmol/L (ref 3.5–5.2)
Sodium: 140 mmol/L (ref 134–144)

## 2017-08-21 LAB — CBC WITH DIFFERENTIAL/PLATELET
BASOS: 1 %
Basophils Absolute: 0.1 10*3/uL (ref 0.0–0.2)
EOS (ABSOLUTE): 0.2 10*3/uL (ref 0.0–0.4)
EOS: 3 %
HEMOGLOBIN: 12 g/dL (ref 11.1–15.9)
Hematocrit: 38.1 % (ref 34.0–46.6)
IMMATURE GRANULOCYTES: 0 %
Immature Grans (Abs): 0 10*3/uL (ref 0.0–0.1)
Lymphocytes Absolute: 2.1 10*3/uL (ref 0.7–3.1)
Lymphs: 41 %
MCH: 27.2 pg (ref 26.6–33.0)
MCHC: 31.5 g/dL (ref 31.5–35.7)
MCV: 86 fL (ref 79–97)
MONOCYTES: 7 %
Monocytes Absolute: 0.4 10*3/uL (ref 0.1–0.9)
NEUTROS PCT: 48 %
Neutrophils Absolute: 2.5 10*3/uL (ref 1.4–7.0)
Platelets: 211 10*3/uL (ref 150–379)
RBC: 4.41 x10E6/uL (ref 3.77–5.28)
RDW: 15.4 % (ref 12.3–15.4)
WBC: 5.2 10*3/uL (ref 3.4–10.8)

## 2017-09-01 ENCOUNTER — Ambulatory Visit (HOSPITAL_COMMUNITY): Payer: Medicare Other | Attending: Internal Medicine

## 2017-09-01 ENCOUNTER — Other Ambulatory Visit: Payer: Self-pay

## 2017-09-01 DIAGNOSIS — E785 Hyperlipidemia, unspecified: Secondary | ICD-10-CM | POA: Insufficient documentation

## 2017-09-01 DIAGNOSIS — I119 Hypertensive heart disease without heart failure: Secondary | ICD-10-CM | POA: Insufficient documentation

## 2017-09-01 DIAGNOSIS — R000 Tachycardia, unspecified: Secondary | ICD-10-CM | POA: Insufficient documentation

## 2017-09-01 LAB — ECHOCARDIOGRAM COMPLETE
CHL CUP TV REG PEAK VELOCITY: 223 cm/s
EERAT: 6.48
EWDT: 222 ms
FS: 27 % — AB (ref 28–44)
IV/PV OW: 0.87
LA diam end sys: 38 mm
LA diam index: 1.91 cm/m2
LA vol A4C: 41 ml
LA vol: 45.4 mL
LASIZE: 38 mm
LAVOLIN: 22.8 mL/m2
LV E/e'average: 6.48
LV PW d: 10.7 mm — AB (ref 0.6–1.1)
LV e' LATERAL: 10.4 cm/s
LVEEMED: 6.48
LVOT SV: 62 mL
LVOT VTI: 21.7 cm
LVOT area: 2.84 cm2
LVOT diameter: 19 mm
LVOT peak vel: 85.7 cm/s
Lateral S' vel: 12.3 cm/s
MV Dec: 222
MV pk A vel: 88.3 m/s
MVPKEVEL: 67.4 m/s
Peak grad: 223 mmHg
RV TAPSE: 16.7 mm
RV sys press: 23 mmHg
TDI e' lateral: 10.4
TDI e' medial: 4.57
TR max vel: 223 cm/s

## 2018-01-31 IMAGING — DX DG ABDOMEN 2V
2 series · 2 of 2 positions shown · non-contrast
Comparison: 04/24/2016

CLINICAL DATA: Follow-up small bowel obstruction

EXAM:
ABDOMEN - 2 VIEW

[abdomen erect]
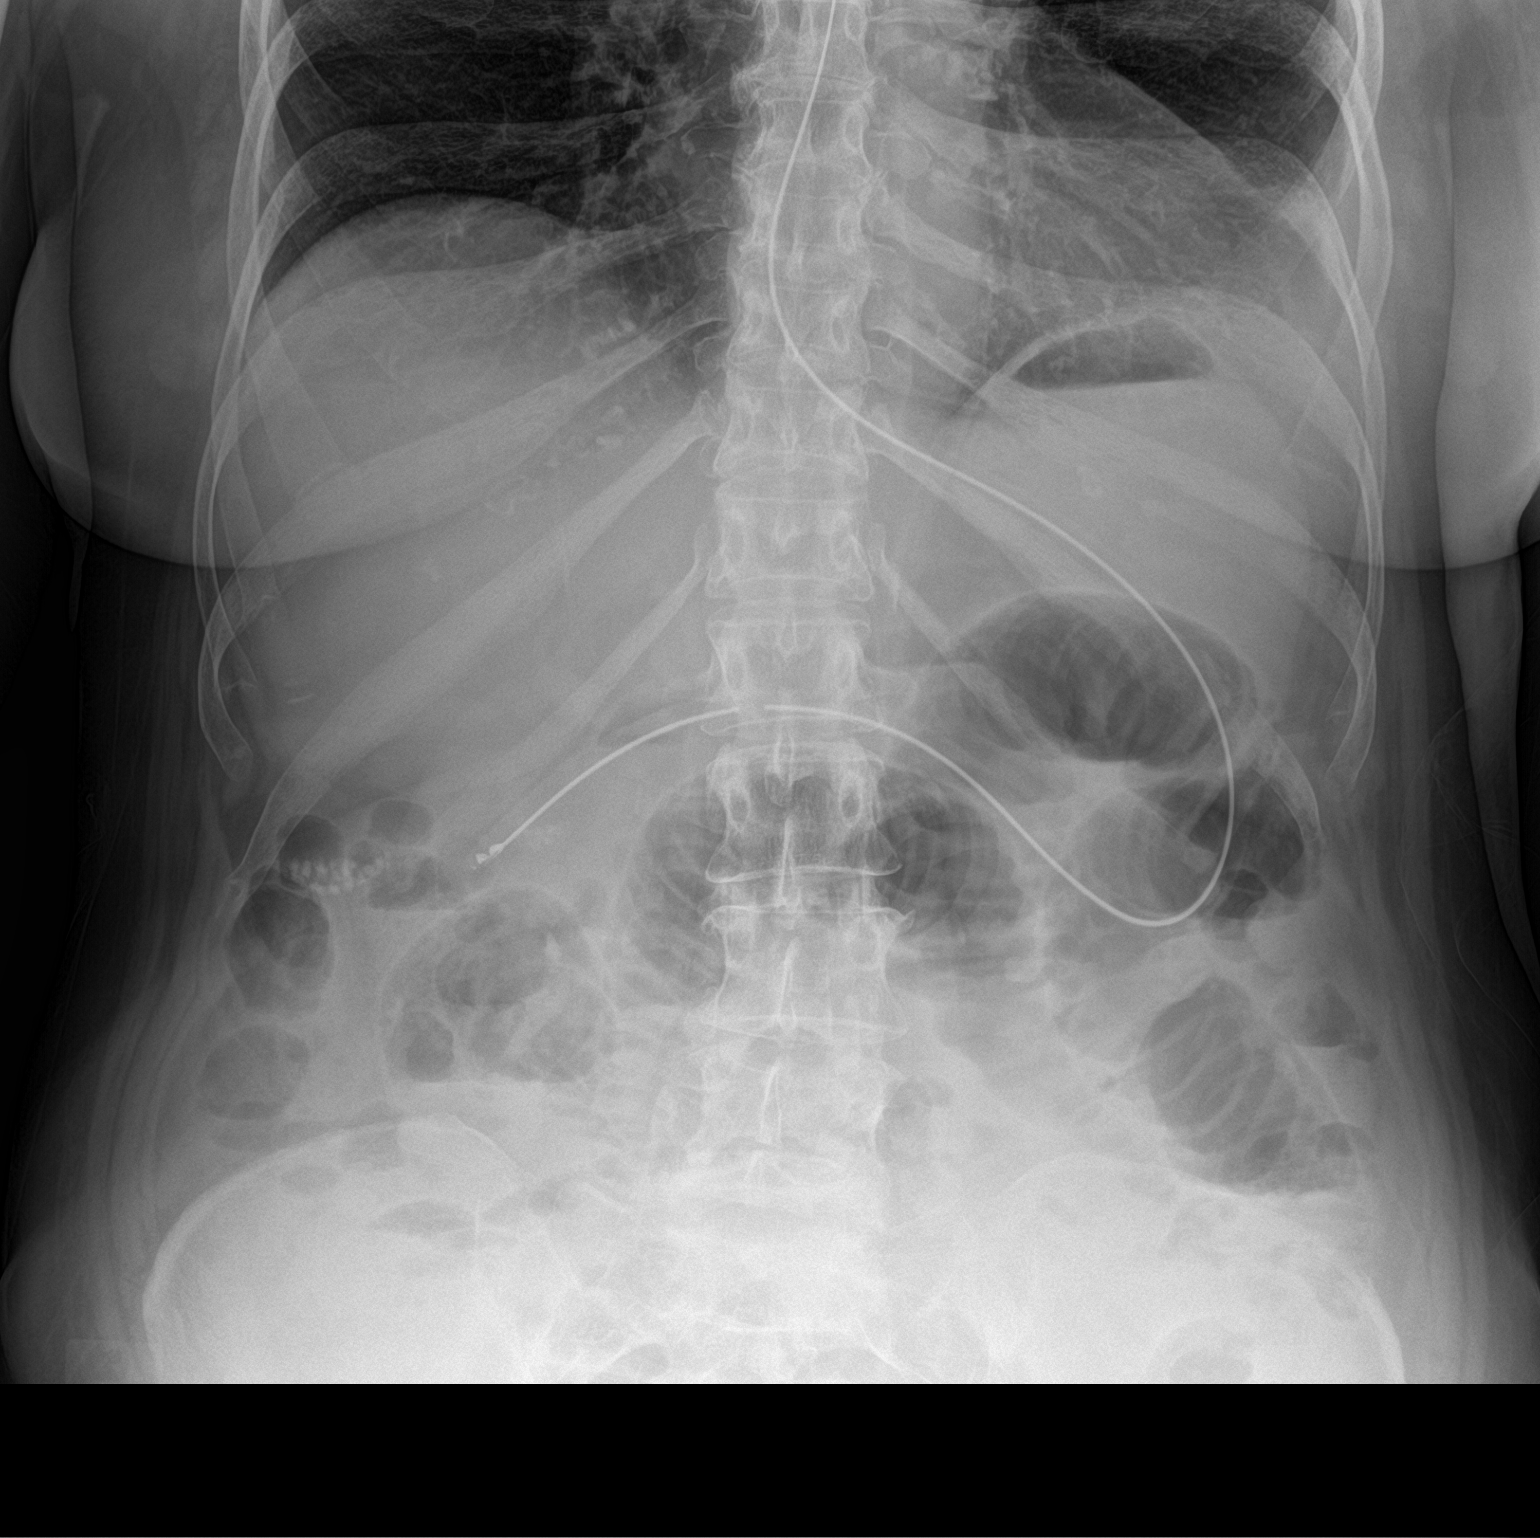

[abdomen supine]
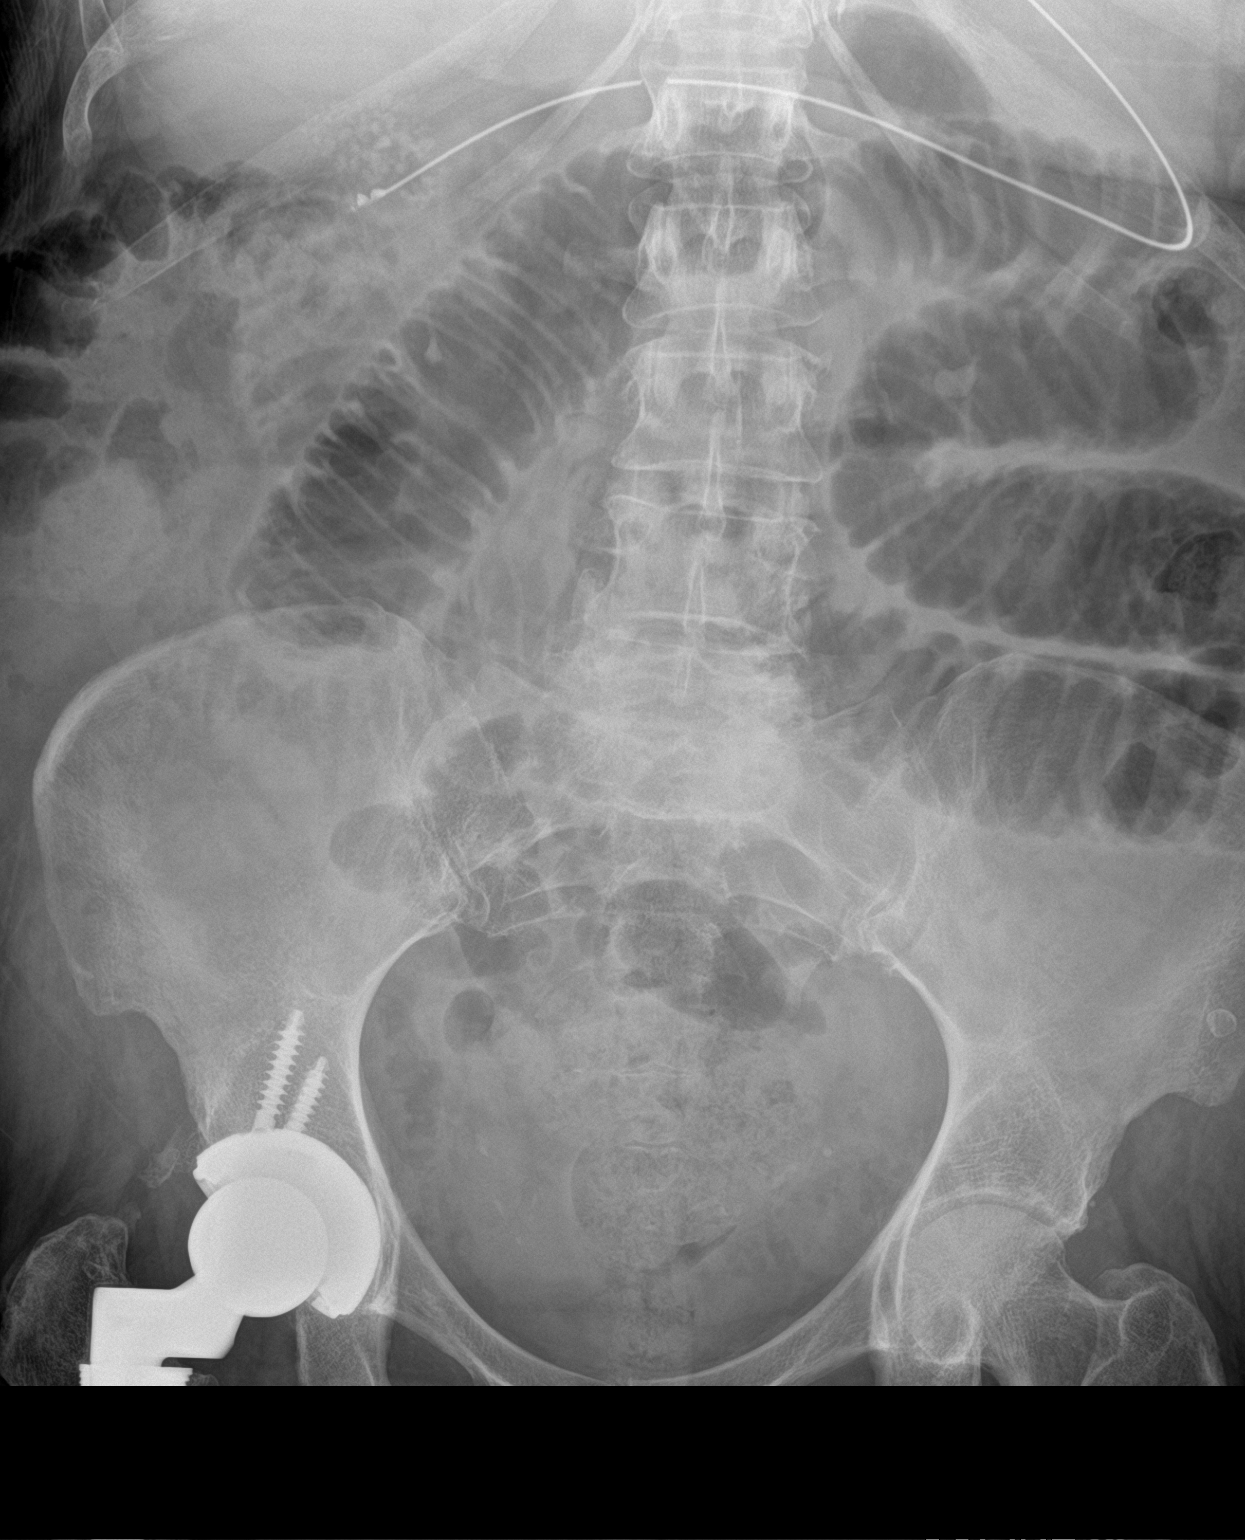

[2 of 2 positions shown; findings below may reference images not displayed]

FINDINGS: Enteric tube terminates in the proximal duodenum.

Dilated loops of small bowel in the mid abdomen, suggesting small
bowel obstruction.

No evidence of free air under the diaphragm on the upright view.

Cholelithiasis.

Right hip arthroplasty.
IMPRESSION: Enteric tube terminates in the proximal duodenum.

Dilated loops of small bowel in the mid abdomen, suggesting small
bowel obstruction. No free air.

Cholelithiasis.

## 2018-05-27 ENCOUNTER — Telehealth: Payer: Self-pay

## 2018-05-27 ENCOUNTER — Encounter: Payer: Self-pay | Admitting: Cardiovascular Disease

## 2018-05-27 ENCOUNTER — Ambulatory Visit (INDEPENDENT_AMBULATORY_CARE_PROVIDER_SITE_OTHER): Payer: Medicare Other | Admitting: Cardiovascular Disease

## 2018-05-27 VITALS — BP 118/72 | HR 92 | Ht 65.5 in | Wt 190.0 lb

## 2018-05-27 DIAGNOSIS — R079 Chest pain, unspecified: Secondary | ICD-10-CM | POA: Insufficient documentation

## 2018-05-27 DIAGNOSIS — E7800 Pure hypercholesterolemia, unspecified: Secondary | ICD-10-CM | POA: Diagnosis not present

## 2018-05-27 DIAGNOSIS — I208 Other forms of angina pectoris: Secondary | ICD-10-CM

## 2018-05-27 LAB — BASIC METABOLIC PANEL
BUN/Creatinine Ratio: 27 (ref 12–28)
BUN: 24 mg/dL (ref 8–27)
CALCIUM: 9.4 mg/dL (ref 8.7–10.3)
CHLORIDE: 102 mmol/L (ref 96–106)
CO2: 21 mmol/L (ref 20–29)
Creatinine, Ser: 0.89 mg/dL (ref 0.57–1.00)
GFR calc non Af Amer: 62 mL/min/{1.73_m2} (ref >59)
GFR, EST AFRICAN AMERICAN: 72 mL/min/{1.73_m2} (ref >59)
GLUCOSE: 98 mg/dL (ref 65–99)
POTASSIUM: 4.6 mmol/L (ref 3.5–5.2)
Sodium: 140 mmol/L (ref 134–144)

## 2018-05-27 LAB — CBC
HEMOGLOBIN: 8.7 g/dL — AB (ref 11.1–15.9)
Hematocrit: 29.8 % — ABNORMAL LOW (ref 34.0–46.6)
MCH: 21.4 pg — ABNORMAL LOW (ref 26.6–33.0)
MCHC: 29.2 g/dL — AB (ref 31.5–35.7)
MCV: 73 fL — ABNORMAL LOW (ref 79–97)
Platelets: 259 10*3/uL (ref 150–450)
RBC: 4.07 x10E6/uL (ref 3.77–5.28)
RDW: 17.9 % — ABNORMAL HIGH (ref 12.3–15.4)
WBC: 5.9 10*3/uL (ref 3.4–10.8)

## 2018-05-27 NOTE — Patient Instructions (Signed)
    [facility] CARDIOVASCULAR DIVISION [facility] [address] [place] [address] Dept: [phone] Loc: [phone]  Monique [name]  05/27/2018  You are scheduled for a Cardiac Catheterization on Monday, October 28 with Dr. [name].  1. Please arrive at the [facility] (Main Entrance A) at [facility]: [address] [place], [place] [address] at 7:30 AM (This time is two hours before your procedure to ensure your preparation). Free valet parking service is available.   Special note: Every effort is made to have your procedure done on time. Please understand that emergencies sometimes delay scheduled procedures.  2. Diet: Do not eat solid foods after midnight.  The patient may have clear liquids until 5am upon the day of the procedure.  3. Labs: You will need to have blood drawn TODAY  4. Medication instructions in preparation for your procedure:  RESTART ASPIRIN 81 MG ONCE DAILY  On the morning of your procedure, take your Aspirin and any morning medicines. You may use sips of water.  5. Plan for one night stay--bring personal belongings. 6. Bring a current list of your medications and current insurance cards. 7. You MUST have a responsible person to drive you home. 8. Someone MUST be with you the first 24 hours after you arrive home or your discharge will be delayed. 9. Please wear clothes that are easy to get on and off and wear slip-on shoes.  Thank you for allowing [place] to care for you!   -- [facility]

## 2018-05-27 NOTE — H&P (View-Only) (Signed)
05/27/2018 Monique [name]   09/26/1940  [id number]  Primary Physician [name], FNP Primary Cardiologist: Runell Gess MD Nicholes Calamity, [place]  HPI:  Monique [name] is a 77 y.o.  moderately overweight widowed Caucasian female mother of 2, grandmother and 2 grandchildren's husband [name] was a long-term patient of mine who died 05-Sep-2016. 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 She was referred by [name] nurse practitioner for a symptomatic sinus tachycardia.  I last saw her in the office 08/20/2017 . Her cardiac risk factors include treated hypertension and hyperlipidemia patient is a family history of heart disease with father who died of a myocardial function and age 12. She has never had a heart attack or stroke. She denies chest pain or shortness of breath. She does exercise and does yoga as well. She has reactive airways disease. She is noted to be tachycardic by her PCP was referred here for further evaluation.  Since I saw her back in January, over the last 6 months she is developed progressive exertional angina and dyspnea on exertion with left upper extremity radiation which is fairly reproducible.   Current Meds  Medication Sig  . cetirizine (ZYRTEC) 10 MG tablet Take 10 mg by mouth at bedtime.   Marland Kitchen lisinopril (PRINIVIL,ZESTRIL) 40 MG tablet Take 40 mg by mouth daily.  Marland Kitchen oxyCODONE-acetaminophen (PERCOCET) 7.5-325 MG tablet Take 1 tablet by mouth every 4 (four) hours as needed for severe pain.  . pantoprazole (PROTONIX) 40 MG tablet Take 1 tablet (40 mg total) by mouth 2 (two) times daily before a meal.  . polyethylene glycol (MIRALAX / GLYCOLAX) packet Take 17 g by mouth 2 (two) times daily. (Patient taking differently: Take 17 g by mouth daily. )  . simvastatin (ZOCOR) 40 MG tablet Take 40 mg by mouth at bedtime.   Marland Kitchen venlafaxine XR (EFFEXOR-XR) 75 MG 24 hr capsule Take 225 mg by mouth at bedtime.      Allergies  Allergen Reactions    . Morphine And Related Anxiety  . Pregabalin     anxiety    Social History   Socioeconomic History  . Marital status: Widowed    Spouse name: Not on file  . Number of children: Not on file  . Years of education: Not on file  . Highest education level: Not on file  Occupational History  . Not on file  Social Needs  . Financial resource strain: Not on file  . Food insecurity:    Worry: Not on file    Inability: Not on file  . Transportation needs:    Medical: Not on file    Non-medical: Not on file  Tobacco Use  . Smoking status: Never Smoker  . Smokeless tobacco: Never Used  Substance and Sexual Activity  . Alcohol use: Yes    Comment: socially- 2 x year  . Drug use: No  . Sexual activity: Not on file  Lifestyle  . Physical activity:    Days per week: Not on file    Minutes per session: Not on file  . Stress: Not on file  Relationships  . Social connections:    Talks on phone: Not on file    Gets together: Not on file    Attends religious service: Not on file    Active member of club or organization: Not on file    Attends meetings of clubs or organizations: Not on file    Relationship status: Not on  file  . Intimate partner violence:    Fear of current or ex partner: Not on file    Emotionally abused: Not on file    Physically abused: Not on file    Forced sexual activity: Not on file  Other Topics Concern  . Not on file  Social History Narrative  . Not on file     Review of Systems: General: negative for chills, fever, night sweats or weight changes.  Cardiovascular: negative for chest pain, dyspnea on exertion, edema, orthopnea, palpitations, paroxysmal nocturnal dyspnea or shortness of breath Dermatological: negative for rash Respiratory: negative for cough or wheezing Urologic: negative for hematuria Abdominal: negative for nausea, vomiting, diarrhea, bright red blood per rectum, melena, or hematemesis Neurologic: negative for visual changes,  syncope, or dizziness All other systems reviewed and are otherwise negative except as noted above.    Blood pressure 118/72, pulse 92, height 5' 5.5" (1.664 m), weight 190 lb (86.2 kg).  General appearance: alert and no distress Neck: no adenopathy, no carotid bruit, no JVD, supple, symmetrical, trachea midline and thyroid not enlarged, symmetric, no tenderness/mass/nodules Lungs: clear to auscultation bilaterally Heart: regular rate and rhythm, S1, S2 normal, no murmur, click, rub or gallop Extremities: extremities normal, atraumatic, no cyanosis or edema Pulses: 2+ and symmetric Skin: Skin color, texture, turgor normal. No rashes or lesions Neurologic: Alert and oriented X 3, normal strength and tone. Normal symmetric reflexes. Normal coordination and gait  EKG not performed today  ASSESSMENT AND PLAN:   Hypertension She of essential hypertension blood pressure measured today 118/72.  She is on lisinopril.  Continue current meds at current dosing.  Hyperlipidemia History of hyperlipidemia on statin therapy  Chest pain Ms. Eugene presents today for evaluation of progressive effort angina which she has had for the last 6 months.  It occurs fairly reproducibly when walking with substernal chest pain, dyspnea and left upper extremity radiation.  Risk factors include hypertension, hyperlipidemia and family history the father who died of myocardial infarctions at age 18.  Based on her symptoms description we have talked about proceeding directly to cardiac catheterization via the right radial approach. The patient understands that risks included but are not limited to stroke (1 in 1000), death (1 in 1000), kidney failure [usually temporary] (1 in 500), bleeding (1 in 200), allergic reaction [possibly serious] (1 in 200). The patient understands and agrees to proceed      Runell Gess MD [facility], [facility] 05/27/2018 11:13 AM

## 2018-05-27 NOTE — Assessment & Plan Note (Signed)
History of hyperlipidemia on statin therapy. 

## 2018-05-27 NOTE — Telephone Encounter (Signed)
   Primary Cardiologist: [name], MD  Chart reviewed as part of pre-operative protocol coverage. The patient was evaluated by Dr. Allyson Sabal today and plan for cardiac cath next week. Will provide clearance based on result.   I will route this recommendation to the requesting party via Epic fax function and remove from pre-op pool. Also route to Dr. Allyson Sabal.   Please call with questions.  [place], [place] 05/27/2018, 12:21 PM

## 2018-05-27 NOTE — Assessment & Plan Note (Signed)
She of essential hypertension blood pressure measured today 118/72.  She is on lisinopril.  Continue current meds at current dosing.

## 2018-05-27 NOTE — Assessment & Plan Note (Signed)
Ms. Caccamo presents today for evaluation of progressive effort angina which she has had for the last 6 months.  It occurs fairly reproducibly when walking with substernal chest pain, dyspnea and left upper extremity radiation.  Risk factors include hypertension, hyperlipidemia and family history the father who died of myocardial infarctions at age 78.  Based on her symptoms description we have talked about proceeding directly to cardiac catheterization via the right radial approach. The patient understands that risks included but are not limited to stroke (1 in 1000), death (1 in 1000), kidney failure [usually temporary] (1 in 500), bleeding (1 in 200), allergic reaction [possibly serious] (1 in 200). The patient understands and agrees to proceed

## 2018-05-27 NOTE — Progress Notes (Signed)
   05/27/2018 Monique [name]   09/09/1940  [id number]  Primary Physician Anderson, Teresa, FNP Primary Cardiologist: Tiajah Oyster J Ajahni Nay MD FACP, FACC, FAHA, FSCAI  HPI:  Monique [name] is a 77 y.o.  moderately overweight widowed Caucasian female mother of 2, grandmother and 2 grandchildren's husband Monique [name] was a long-term patient of mine who died 08/07/16. 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 She was referred by Teresa Anderson nurse practitioner for a symptomatic sinus tachycardia.  I last saw her in the office 08/20/2017 . Her cardiac risk factors include treated hypertension and hyperlipidemia patient is a family history of heart disease with father who died of a myocardial function and age 41. She has never had a heart attack or stroke. She denies chest pain or shortness of breath. She does exercise and does yoga as well. She has reactive airways disease. She is noted to be tachycardic by her PCP was referred here for further evaluation.  Since I saw her back in January, over the last 6 months she is developed progressive exertional angina and dyspnea on exertion with left upper extremity radiation which is fairly reproducible.   Current Meds  Medication Sig  . cetirizine (ZYRTEC) 10 MG tablet Take 10 mg by mouth at bedtime.   . lisinopril (PRINIVIL,ZESTRIL) 40 MG tablet Take 40 mg by mouth daily.  . oxyCODONE-acetaminophen (PERCOCET) 7.5-325 MG tablet Take 1 tablet by mouth every 4 (four) hours as needed for severe pain.  . pantoprazole (PROTONIX) 40 MG tablet Take 1 tablet (40 mg total) by mouth 2 (two) times daily before a meal.  . polyethylene glycol (MIRALAX / GLYCOLAX) packet Take 17 g by mouth 2 (two) times daily. (Patient taking differently: Take 17 g by mouth daily. )  . simvastatin (ZOCOR) 40 MG tablet Take 40 mg by mouth at bedtime.   . venlafaxine XR (EFFEXOR-XR) 75 MG 24 hr capsule Take 225 mg by mouth at bedtime.      Allergies  Allergen Reactions    . Morphine And Related Anxiety  . Pregabalin     anxiety    Social History   Socioeconomic History  . Marital status: Widowed    Spouse name: Not on file  . Number of children: Not on file  . Years of education: Not on file  . Highest education level: Not on file  Occupational History  . Not on file  Social Needs  . Financial resource strain: Not on file  . Food insecurity:    Worry: Not on file    Inability: Not on file  . Transportation needs:    Medical: Not on file    Non-medical: Not on file  Tobacco Use  . Smoking status: Never Smoker  . Smokeless tobacco: Never Used  Substance and Sexual Activity  . Alcohol use: Yes    Comment: socially- 2 x year  . Drug use: No  . Sexual activity: Not on file  Lifestyle  . Physical activity:    Days per week: Not on file    Minutes per session: Not on file  . Stress: Not on file  Relationships  . Social connections:    Talks on phone: Not on file    Gets together: Not on file    Attends religious service: Not on file    Active member of club or organization: Not on file    Attends meetings of clubs or organizations: Not on file    Relationship status: Not on   file  . Intimate partner violence:    Fear of current or ex partner: Not on file    Emotionally abused: Not on file    Physically abused: Not on file    Forced sexual activity: Not on file  Other Topics Concern  . Not on file  Social History Narrative  . Not on file     Review of Systems: General: negative for chills, fever, night sweats or weight changes.  Cardiovascular: negative for chest pain, dyspnea on exertion, edema, orthopnea, palpitations, paroxysmal nocturnal dyspnea or shortness of breath Dermatological: negative for rash Respiratory: negative for cough or wheezing Urologic: negative for hematuria Abdominal: negative for nausea, vomiting, diarrhea, bright red blood per rectum, melena, or hematemesis Neurologic: negative for visual changes,  syncope, or dizziness All other systems reviewed and are otherwise negative except as noted above.    Blood pressure 118/72, pulse 92, height 5' 5.5" (1.664 m), weight 190 lb (86.2 kg).  General appearance: alert and no distress Neck: no adenopathy, no carotid bruit, no JVD, supple, symmetrical, trachea midline and thyroid not enlarged, symmetric, no tenderness/mass/nodules Lungs: clear to auscultation bilaterally Heart: regular rate and rhythm, S1, S2 normal, no murmur, click, rub or gallop Extremities: extremities normal, atraumatic, no cyanosis or edema Pulses: 2+ and symmetric Skin: Skin color, texture, turgor normal. No rashes or lesions Neurologic: Alert and oriented X 3, normal strength and tone. Normal symmetric reflexes. Normal coordination and gait  EKG not performed today  ASSESSMENT AND PLAN:   Hypertension She of essential hypertension blood pressure measured today 118/72.  She is on lisinopril.  Continue current meds at current dosing.  Hyperlipidemia History of hyperlipidemia on statin therapy  Chest pain [name] presents today for evaluation of progressive effort angina which she has had for the last 6 months.  It occurs fairly reproducibly when walking with substernal chest pain, dyspnea and left upper extremity radiation.  Risk factors include hypertension, hyperlipidemia and family history the father who died of myocardial infarctions at age 41.  Based on her symptoms description we have talked about proceeding directly to cardiac catheterization via the right radial approach. The patient understands that risks included but are not limited to stroke (1 in 1000), death (1 in 1000), kidney failure [usually temporary] (1 in 500), bleeding (1 in 200), allergic reaction [possibly serious] (1 in 200). The patient understands and agrees to proceed      Tore Carreker J. Lamiya Naas MD FACP,FACC,FAHA, FSCAI 05/27/2018 11:13 AM 

## 2018-05-27 NOTE — Telephone Encounter (Signed)
   [facility] Pre-operative Risk Assessment    Request for surgical clearance:  1. What type of surgery is being performed? EGD   2. When is this surgery scheduled? 06-15-2018   3. What type of clearance is required (medical clearance vs. Pharmacy clearance to hold med vs. Both)? MEDICAL  4. Are there any medications that need to be held prior to surgery and how long?NOT LISTED   5. Practice name and name of physician performing surgery? [facility]   6. What is your office phone number [phone]    7.   What is your office fax number [phone]  8.   Anesthesia type (None, local, MAC, general) ? NONE LISTED   Waylan Rocher 05/27/2018, 10:57 AM  _________________________________________________________________   (provider comments below)

## 2018-05-30 ENCOUNTER — Encounter (HOSPITAL_COMMUNITY)
Admission: RE | Disposition: A | Discharge status: Home / Self Care | Payer: Self-pay | Source: Ambulatory Visit | Attending: Cardiovascular Disease

## 2018-05-30 ENCOUNTER — Other Ambulatory Visit: Payer: Self-pay

## 2018-05-30 ENCOUNTER — Ambulatory Visit (HOSPITAL_COMMUNITY)
Admission: RE | Admit: 2018-05-30 | Discharge: 2018-06-01 | Disposition: A | Discharge status: Home / Self Care | Payer: Medicare Other | Source: Ambulatory Visit | Attending: Cardiovascular Disease | Admitting: Cardiovascular Disease

## 2018-05-30 ENCOUNTER — Encounter (HOSPITAL_COMMUNITY): Payer: Self-pay | Admitting: Cardiovascular Disease

## 2018-05-30 DIAGNOSIS — E663 Overweight: Secondary | ICD-10-CM | POA: Diagnosis not present

## 2018-05-30 DIAGNOSIS — Z885 Allergy status to narcotic agent status: Secondary | ICD-10-CM | POA: Diagnosis not present

## 2018-05-30 DIAGNOSIS — R000 Tachycardia, unspecified: Secondary | ICD-10-CM | POA: Insufficient documentation

## 2018-05-30 DIAGNOSIS — I2510 Atherosclerotic heart disease of native coronary artery without angina pectoris: Secondary | ICD-10-CM | POA: Diagnosis present

## 2018-05-30 DIAGNOSIS — Z79899 Other long term (current) drug therapy: Secondary | ICD-10-CM | POA: Insufficient documentation

## 2018-05-30 DIAGNOSIS — E785 Hyperlipidemia, unspecified: Secondary | ICD-10-CM | POA: Diagnosis not present

## 2018-05-30 DIAGNOSIS — Z6831 Body mass index (BMI) 31.0-31.9, adult: Secondary | ICD-10-CM | POA: Insufficient documentation

## 2018-05-30 DIAGNOSIS — I2584 Coronary atherosclerosis due to calcified coronary lesion: Secondary | ICD-10-CM | POA: Insufficient documentation

## 2018-05-30 DIAGNOSIS — I10 Essential (primary) hypertension: Secondary | ICD-10-CM | POA: Diagnosis not present

## 2018-05-30 DIAGNOSIS — Z9861 Coronary angioplasty status: Secondary | ICD-10-CM

## 2018-05-30 DIAGNOSIS — E7800 Pure hypercholesterolemia, unspecified: Secondary | ICD-10-CM

## 2018-05-30 DIAGNOSIS — I25119 Atherosclerotic heart disease of native coronary artery with unspecified angina pectoris: Secondary | ICD-10-CM | POA: Diagnosis present

## 2018-05-30 DIAGNOSIS — I208 Other forms of angina pectoris: Secondary | ICD-10-CM

## 2018-05-30 DIAGNOSIS — Z23 Encounter for immunization: Secondary | ICD-10-CM | POA: Insufficient documentation

## 2018-05-30 DIAGNOSIS — Z955 Presence of coronary angioplasty implant and graft: Secondary | ICD-10-CM

## 2018-05-30 DIAGNOSIS — Z8249 Family history of ischemic heart disease and other diseases of the circulatory system: Secondary | ICD-10-CM | POA: Insufficient documentation

## 2018-05-30 HISTORY — PX: CORONARY STENT INTERVENTION: CATH118234

## 2018-05-30 HISTORY — DX: Gastrointestinal hemorrhage, unspecified: K92.2

## 2018-05-30 HISTORY — PX: LEFT HEART CATH AND CORONARY ANGIOGRAPHY: CATH118249

## 2018-05-30 HISTORY — DX: Atherosclerotic heart disease of native coronary artery without angina pectoris: I25.10

## 2018-05-30 LAB — POCT ACTIVATED CLOTTING TIME: Activated Clotting Time: 532 seconds

## 2018-05-30 SURGERY — LEFT HEART CATH AND CORONARY ANGIOGRAPHY
Anesthesia: LOCAL

## 2018-05-30 MED ORDER — SODIUM CHLORIDE 0.9 % WEIGHT BASED INFUSION: 1.0000 mL/kg/h | INTRAVENOUS | Status: DC

## 2018-05-30 MED ORDER — FAMOTIDINE IN NACL 20-0.9 MG/50ML-% IV SOLN
INTRAVENOUS | Status: AC | PRN
  Administered 2018-05-30: 20 mg via INTRAVENOUS

## 2018-05-30 MED ORDER — NITROGLYCERIN 1 MG/10 ML FOR IR/CATH LAB
INTRA_ARTERIAL | Status: AC
  Filled 2018-05-30: qty 10

## 2018-05-30 MED ORDER — HEPARIN SODIUM (PORCINE) 1000 UNIT/ML IJ SOLN
INTRAMUSCULAR | Status: DC | PRN
  Administered 2018-05-30: 4500 [IU] via INTRAVENOUS

## 2018-05-30 MED ORDER — LABETALOL HCL 5 MG/ML IV SOLN
10.0000 mg | INTRAVENOUS | Status: AC | PRN
  Administered 2018-05-30: 15:00:00 10 mg via INTRAVENOUS
  Filled 2018-05-30: qty 4

## 2018-05-30 MED ORDER — SODIUM CHLORIDE 0.9 % IV SOLN
INTRAVENOUS | Status: AC
  Administered 2018-05-30: 12:00:00 via INTRAVENOUS

## 2018-05-30 MED ORDER — VERAPAMIL HCL 2.5 MG/ML IV SOLN
INTRA_ARTERIAL | Status: DC | PRN
  Administered 2018-05-30: 7.5 mL via INTRA_ARTERIAL

## 2018-05-30 MED ORDER — NITROGLYCERIN 0.4 MG SL SUBL
SUBLINGUAL_TABLET | SUBLINGUAL | Status: AC
  Filled 2018-05-30: qty 1

## 2018-05-30 MED ORDER — SIMVASTATIN 20 MG PO TABS
40.0000 mg | ORAL_TABLET | Freq: Every day | ORAL | Status: DC
  Administered 2018-05-30: 21:00:00 40 mg via ORAL
  Filled 2018-05-30: qty 2

## 2018-05-30 MED ORDER — MIDAZOLAM HCL 2 MG/2ML IJ SOLN
INTRAMUSCULAR | Status: AC
  Filled 2018-05-30: qty 2

## 2018-05-30 MED ORDER — HEPARIN (PORCINE) IN NACL 1000-0.9 UT/500ML-% IV SOLN
INTRAVENOUS | Status: AC
  Filled 2018-05-30: qty 1000

## 2018-05-30 MED ORDER — VENLAFAXINE HCL ER 75 MG PO CP24
225.0000 mg | ORAL_CAPSULE | Freq: Every day | ORAL | Status: DC
  Administered 2018-05-30 – 2018-05-31 (×2): 225 mg via ORAL
  Filled 2018-05-30 (×2): qty 1

## 2018-05-30 MED ORDER — MIDAZOLAM HCL 2 MG/2ML IJ SOLN
INTRAMUSCULAR | Status: DC | PRN
  Administered 2018-05-30: 1 mg via INTRAVENOUS

## 2018-05-30 MED ORDER — SODIUM CHLORIDE 0.9% FLUSH
3.0000 mL | Freq: Two times a day (BID) | INTRAVENOUS | Status: DC
  Administered 2018-05-30 – 2018-05-31 (×3): 3 mL via INTRAVENOUS

## 2018-05-30 MED ORDER — ASPIRIN 81 MG PO CHEW: 81.0000 mg | CHEWABLE_TABLET | Freq: Every day | ORAL | Status: DC

## 2018-05-30 MED ORDER — CLOPIDOGREL BISULFATE 75 MG PO TABS
75.0000 mg | ORAL_TABLET | Freq: Every day | ORAL | Status: DC
  Administered 2018-05-31 – 2018-06-01 (×2): 75 mg via ORAL
  Filled 2018-05-30 (×2): qty 1

## 2018-05-30 MED ORDER — ASPIRIN 81 MG PO CHEW
81.0000 mg | CHEWABLE_TABLET | ORAL | Status: AC
  Administered 2018-05-30: 81 mg via ORAL
  Filled 2018-05-30: qty 1

## 2018-05-30 MED ORDER — PANTOPRAZOLE SODIUM 40 MG PO TBEC
40.0000 mg | DELAYED_RELEASE_TABLET | Freq: Two times a day (BID) | ORAL | Status: DC
  Administered 2018-05-30 – 2018-06-01 (×4): 40 mg via ORAL
  Filled 2018-05-30 (×4): qty 1

## 2018-05-30 MED ORDER — BIVALIRUDIN BOLUS VIA INFUSION - CUPID
INTRAVENOUS | Status: DC | PRN
  Administered 2018-05-30: 64.65 mg via INTRAVENOUS

## 2018-05-30 MED ORDER — HYDRALAZINE HCL 20 MG/ML IJ SOLN: 5.0000 mg | INTRAMUSCULAR | Status: AC | PRN

## 2018-05-30 MED ORDER — LISINOPRIL 40 MG PO TABS
40.0000 mg | ORAL_TABLET | Freq: Every day | ORAL | Status: DC
  Administered 2018-05-31 – 2018-06-01 (×2): 40 mg via ORAL
  Filled 2018-05-30 (×2): qty 1

## 2018-05-30 MED ORDER — SODIUM CHLORIDE 0.9 % WEIGHT BASED INFUSION
3.0000 mL/kg/h | INTRAVENOUS | Status: DC
  Administered 2018-05-30: 3 mL/kg/h via INTRAVENOUS

## 2018-05-30 MED ORDER — SODIUM CHLORIDE 0.9% FLUSH: 3.0000 mL | INTRAVENOUS | Status: DC | PRN

## 2018-05-30 MED ORDER — ONDANSETRON HCL 4 MG/2ML IJ SOLN
4.0000 mg | Freq: Four times a day (QID) | INTRAMUSCULAR | Status: DC | PRN
  Administered 2018-05-30: 4 mg via INTRAVENOUS
  Filled 2018-05-30: qty 2

## 2018-05-30 MED ORDER — CLOPIDOGREL BISULFATE 300 MG PO TABS
ORAL_TABLET | ORAL | Status: AC
  Filled 2018-05-30: qty 2

## 2018-05-30 MED ORDER — ANGIOPLASTY BOOK
Freq: Once | Status: AC
  Administered 2018-05-30: 1
  Filled 2018-05-30: qty 1

## 2018-05-30 MED ORDER — SODIUM CHLORIDE 0.9 % IV SOLN: 250.0000 mL | INTRAVENOUS | Status: DC | PRN

## 2018-05-30 MED ORDER — FAMOTIDINE IN NACL 20-0.9 MG/50ML-% IV SOLN
INTRAVENOUS | Status: AC
  Filled 2018-05-30: qty 50

## 2018-05-30 MED ORDER — CLOPIDOGREL BISULFATE 300 MG PO TABS
ORAL_TABLET | ORAL | Status: DC | PRN
  Administered 2018-05-30: 600 mg via ORAL

## 2018-05-30 MED ORDER — ASPIRIN EC 81 MG PO TBEC
81.0000 mg | DELAYED_RELEASE_TABLET | Freq: Every day | ORAL | Status: DC
  Administered 2018-05-31 – 2018-06-01 (×2): 81 mg via ORAL
  Filled 2018-05-30 (×2): qty 1

## 2018-05-30 MED ORDER — LIDOCAINE HCL (PF) 1 % IJ SOLN
INTRAMUSCULAR | Status: AC
  Filled 2018-05-30: qty 30

## 2018-05-30 MED ORDER — HEPARIN SODIUM (PORCINE) 1000 UNIT/ML IJ SOLN
INTRAMUSCULAR | Status: AC
  Filled 2018-05-30: qty 1

## 2018-05-30 MED ORDER — BIVALIRUDIN TRIFLUOROACETATE 250 MG IV SOLR
INTRAVENOUS | Status: AC
  Filled 2018-05-30: qty 250

## 2018-05-30 MED ORDER — SODIUM CHLORIDE 0.9% FLUSH: 3.0000 mL | Freq: Two times a day (BID) | INTRAVENOUS | Status: DC

## 2018-05-30 MED ORDER — SODIUM CHLORIDE 0.9 % IV SOLN
INTRAVENOUS | Status: AC | PRN
  Administered 2018-05-30: 1.75 mg/kg/h via INTRAVENOUS
  Administered 2018-05-30: 1.75 mg/kg/h

## 2018-05-30 MED ORDER — PNEUMOCOCCAL VAC POLYVALENT 25 MCG/0.5ML IJ INJ
0.5000 mL | INJECTION | INTRAMUSCULAR | Status: AC
  Administered 2018-05-31: 0.5 mL via INTRAMUSCULAR
  Filled 2018-05-30: qty 0.5

## 2018-05-30 MED ORDER — HEPARIN (PORCINE) IN NACL 1000-0.9 UT/500ML-% IV SOLN
INTRAVENOUS | Status: DC | PRN
  Administered 2018-05-30 (×2): 500 mL

## 2018-05-30 MED ORDER — IOHEXOL 350 MG/ML SOLN
INTRAVENOUS | Status: DC | PRN
  Administered 2018-05-30: 170 mL

## 2018-05-30 MED ORDER — ACETAMINOPHEN 325 MG PO TABS: 650.0000 mg | ORAL_TABLET | ORAL | Status: DC | PRN

## 2018-05-30 MED ORDER — OXYCODONE-ACETAMINOPHEN 7.5-325 MG PO TABS: 1.0000 | ORAL_TABLET | ORAL | Status: DC | PRN

## 2018-05-30 MED ORDER — VERAPAMIL HCL 2.5 MG/ML IV SOLN
INTRAVENOUS | Status: AC
  Filled 2018-05-30: qty 2

## 2018-05-30 MED ORDER — FENTANYL CITRATE (PF) 100 MCG/2ML IJ SOLN
INTRAMUSCULAR | Status: DC | PRN
  Administered 2018-05-30: 25 ug via INTRAVENOUS

## 2018-05-30 MED ORDER — ALUM & MAG HYDROXIDE-SIMETH 200-200-20 MG/5ML PO SUSP
30.0000 mL | ORAL | Status: DC | PRN
  Administered 2018-05-30 – 2018-05-31 (×2): 30 mL via ORAL
  Filled 2018-05-30 (×2): qty 30

## 2018-05-30 MED ORDER — FENTANYL CITRATE (PF) 100 MCG/2ML IJ SOLN
INTRAMUSCULAR | Status: AC
  Filled 2018-05-30: qty 2

## 2018-05-30 MED ORDER — SODIUM CHLORIDE 0.9 % IV SOLN
1.7500 mg/kg/h | INTRAVENOUS | Status: AC
  Filled 2018-05-30: qty 250

## 2018-05-30 MED ORDER — LIDOCAINE HCL (PF) 1 % IJ SOLN
INTRAMUSCULAR | Status: DC | PRN
  Administered 2018-05-30: 2 mL

## 2018-05-30 SURGICAL SUPPLY — 23 items
BALLN EMERGE MR 2.0X12 (BALLOONS) ×2
BALLN ~~LOC~~ EMERGE MR 2.5X12 (BALLOONS) ×2
BALLN ~~LOC~~ EUPHORA RX 2.5X8 (BALLOONS) ×2
BALLOON EMERGE MR 2.0X12 (BALLOONS) ×1 IMPLANT
BALLOON ~~LOC~~ EMERGE MR 2.5X12 (BALLOONS) ×1 IMPLANT
BALLOON ~~LOC~~ EUPHORA RX 2.5X8 (BALLOONS) ×1 IMPLANT
CATH INFINITI 5FR ANG PIGTAIL (CATHETERS) ×2 IMPLANT
CATH OPTITORQUE TIG 4.0 5F (CATHETERS) ×2 IMPLANT
CATH VISTA GUIDE 6FR XBLAD3.0 (CATHETERS) ×2 IMPLANT
CATH VISTA GUIDE 6FR XBLAD3.5 (CATHETERS) ×2 IMPLANT
DEVICE RAD COMP TR BAND LRG (VASCULAR PRODUCTS) ×2 IMPLANT
GLIDESHEATH SLEND A-KIT 6F 22G (SHEATH) ×2 IMPLANT
GUIDEWIRE INQWIRE 1.5J.035X260 (WIRE) ×1 IMPLANT
INQWIRE 1.5J .035X260CM (WIRE) ×2
KIT ENCORE 26 ADVANTAGE (KITS) ×2 IMPLANT
KIT HEART LEFT (KITS) ×2 IMPLANT
PACK CARDIAC CATHETERIZATION (CUSTOM PROCEDURE TRAY) ×2 IMPLANT
STENT SYNERGY DES 2.5X20 (Permanent Stent) ×2 IMPLANT
SYR MEDRAD MARK V 150ML (SYRINGE) ×2 IMPLANT
TRANSDUCER W/STOPCOCK (MISCELLANEOUS) ×2 IMPLANT
TUBING CIL FLEX 10 FLL-RA (TUBING) ×2 IMPLANT
WIRE ASAHI PROWATER 180CM (WIRE) ×2 IMPLANT
WIRE HI TORQ VERSACORE-J 145CM (WIRE) ×2 IMPLANT

## 2018-05-30 NOTE — Progress Notes (Signed)
TR BAND REMOVAL  LOCATION:    right radial  DEFLATED PER PROTOCOL:    Yes.    TIME BAND OFF / DRESSING APPLIED:    20:00   SITE UPON ARRIVAL:    Level 0  SITE AFTER BAND REMOVAL:    Level 0  CIRCULATION SENSATION AND MOVEMENT:    Within Normal Limits   Yes.    COMMENTS:   Post TR band instructions given. Pt tolerated well. 

## 2018-05-30 NOTE — Interval H&P Note (Signed)
Cath Lab Visit (complete for each Cath Lab visit)  Clinical Evaluation Leading to the Procedure:   ACS: Yes.    Non-ACS:    Anginal Classification: CCS III  Anti-ischemic medical therapy: Minimal Therapy (1 class of medications)  Non-Invasive Test Results: No non-invasive testing performed  Prior CABG: No previous CABG      History and Physical Interval Note:  05/30/2018 10:24 AM  Monique [name]  has presented today for surgery, with the diagnosis of EXCERTIONAL ANGINA  The various methods of treatment have been discussed with the patient and family. After consideration of risks, benefits and other options for treatment, the patient has consented to  Procedure(s): LEFT HEART CATH AND CORONARY ANGIOGRAPHY (N/A) as a surgical intervention .  The patient's history has been reviewed, patient examined, no change in status, stable for surgery.  I have reviewed the patient's chart and labs.  Questions were answered to the patient's satisfaction.     [name]

## 2018-05-31 DIAGNOSIS — D649 Anemia, unspecified: Secondary | ICD-10-CM

## 2018-05-31 DIAGNOSIS — I25110 Atherosclerotic heart disease of native coronary artery with unstable angina pectoris: Secondary | ICD-10-CM

## 2018-05-31 DIAGNOSIS — I10 Essential (primary) hypertension: Secondary | ICD-10-CM

## 2018-05-31 DIAGNOSIS — I2584 Coronary atherosclerosis due to calcified coronary lesion: Secondary | ICD-10-CM | POA: Diagnosis not present

## 2018-05-31 DIAGNOSIS — E785 Hyperlipidemia, unspecified: Secondary | ICD-10-CM | POA: Diagnosis not present

## 2018-05-31 DIAGNOSIS — I25119 Atherosclerotic heart disease of native coronary artery with unspecified angina pectoris: Secondary | ICD-10-CM | POA: Diagnosis not present

## 2018-05-31 LAB — BASIC METABOLIC PANEL
Anion gap: 6 (ref 5–15)
BUN: 16 mg/dL (ref 8–23)
CHLORIDE: 110 mmol/L (ref 98–111)
CO2: 25 mmol/L (ref 22–32)
Calcium: 8.7 mg/dL — ABNORMAL LOW (ref 8.9–10.3)
Creatinine, Ser: 0.73 mg/dL (ref 0.44–1.00)
GFR calc non Af Amer: >60 mL/min (ref >60)
Glucose, Bld: 133 mg/dL — ABNORMAL HIGH (ref 70–99)
POTASSIUM: 3.9 mmol/L (ref 3.5–5.1)
SODIUM: 141 mmol/L (ref 135–145)

## 2018-05-31 LAB — CBC
HEMATOCRIT: 28.1 % — AB (ref 36.0–46.0)
Hemoglobin: 7.8 g/dL — ABNORMAL LOW (ref 12.0–15.0)
MCH: 21.1 pg — ABNORMAL LOW (ref 26.0–34.0)
MCHC: 27.8 g/dL — ABNORMAL LOW (ref 30.0–36.0)
MCV: 75.9 fL — AB (ref 80.0–100.0)
NRBC: 0 % (ref 0.0–0.2)
Platelets: 202 10*3/uL (ref 150–400)
RBC: 3.7 MIL/uL — ABNORMAL LOW (ref 3.87–5.11)
RDW: 18.9 % — ABNORMAL HIGH (ref 11.5–15.5)
WBC: 5.3 10*3/uL (ref 4.0–10.5)

## 2018-05-31 LAB — MRSA PCR SCREENING: MRSA by PCR: NEGATIVE

## 2018-05-31 MED ORDER — ATORVASTATIN CALCIUM 80 MG PO TABS
80.0000 mg | ORAL_TABLET | Freq: Every day | ORAL | Status: DC
  Administered 2018-05-31: 17:00:00 80 mg via ORAL
  Filled 2018-05-31: qty 1

## 2018-05-31 MED ORDER — CARVEDILOL 3.125 MG PO TABS
3.1250 mg | ORAL_TABLET | Freq: Two times a day (BID) | ORAL | Status: DC
  Administered 2018-05-31 – 2018-06-01 (×3): 3.125 mg via ORAL
  Filled 2018-05-31 (×3): qty 1

## 2018-05-31 MED ORDER — LORATADINE 10 MG PO TABS
10.0000 mg | ORAL_TABLET | Freq: Every day | ORAL | Status: DC
  Administered 2018-05-31: 21:00:00 10 mg via ORAL
  Filled 2018-05-31: qty 1

## 2018-05-31 NOTE — Progress Notes (Addendum)
Progress Note  Patient Name: Monique [name] Date of Encounter: 05/31/2018  Primary Cardiologist: [name], MD  Subjective   Feeling well this morning.   Inpatient Medications    Scheduled Meds: . aspirin EC  81 mg Oral Daily  . clopidogrel  75 mg Oral Q breakfast  . lisinopril  40 mg Oral Daily  . pantoprazole  40 mg Oral BID AC  . pneumococcal 23 valent vaccine  0.5 mL Intramuscular Tomorrow-1000  . simvastatin  40 mg Oral QHS  . sodium chloride flush  3 mL Intravenous Q12H  . venlafaxine XR  225 mg Oral QHS   Continuous Infusions: . sodium chloride     PRN Meds: sodium chloride, acetaminophen, alum & mag hydroxide-simeth, ondansetron (ZOFRAN) IV, oxyCODONE-acetaminophen, sodium chloride flush   Vital Signs    Vitals:   05/30/18 1900 05/30/18 1943 05/30/18 2000 05/31/18 0439  BP: (!) 166/75 (!) 166/75  (!) 166/71  Pulse: 98 94 90 96  Resp: 17 14 15 14   Temp:  98.1 F (36.7 C)  97.6 F (36.4 C)  TempSrc:  Oral  Oral  SpO2: 98% 100% 100% 98%  Weight:    85.8 kg  Height:        Intake/Output Summary (Last 24 hours) at 05/31/2018 0806 Last data filed at 05/30/2018 2148 Gross per 24 hour  Intake 1248.9 ml  Output 1000 ml  Net 248.9 ml   Filed Weights   05/30/18 0752 05/31/18 0439  Weight: 86.2 kg 85.8 kg    Telemetry    SR - Personally Reviewed  ECG    SR with TWI in inferolateral leads (old) - Personally Reviewed  Physical Exam   General: Well developed, well nourished, female appearing in no acute distress. Head: Normocephalic, atraumatic.  Neck: Supple without bruits, JVD. Lungs:  Resp regular and unlabored, CTA. Heart: RRR, S1, S2, no S3, S4, or murmur; no rub. Abdomen: Soft, non-tender, non-distended with normoactive bowel sounds.  Extremities: No clubbing, cyanosis, edema. Distal pedal pulses are 2+ bilaterally. Right radial cath site stable.  Neuro: Alert and oriented X 3. Moves all extremities spontaneously. Psych: Normal  affect.  Labs    Chemistry Recent Labs  Lab 05/27/18 1132 05/31/18 0611  NA 140 141  K 4.6 3.9  CL 102 110  CO2 21 25  GLUCOSE 98 133*  BUN 24 16  CREATININE 0.89 0.73  CALCIUM 9.4 8.7*  GFRNONAA 62 >60  GFRAA 72 >60  ANIONGAP  --  6     Hematology Recent Labs  Lab 05/27/18 1132 05/31/18 0611  WBC 5.9 5.3  RBC 4.07 3.70*  HGB 8.7* 7.8*  HCT 29.8* 28.1*  MCV 73* 75.9*  MCH 21.4* 21.1*  MCHC 29.2* 27.8*  RDW 17.9* 18.9*  PLT 259 202    Cardiac EnzymesNo results for input(s): TROPONINI in the last 168 hours. No results for input(s): TROPIPOC in the last 168 hours.   BNPNo results for input(s): BNP, PROBNP in the last 168 hours.   DDimer No results for input(s): DDIMER in the last 168 hours.    Radiology    No results found.  Cardiac Studies   Cath: 05/30/18   Ost 1st Mrg to 1st Mrg lesion is 40% stenosed.  Ramus lesion is 80% stenosed.  Ost LAD to Prox LAD lesion is 90% stenosed.  A stent was successfully placed.  Post intervention, there is a 50% residual stenosis.  The left ventricular systolic function is normal.  LV end diastolic  pressure is normal.  The left ventricular ejection fraction is 55-65% by visual estimate.   IMPRESSION: PCI and drug-eluting stenting of a calcified segmental proximal LAD lesion with full expansion of the stent except in the proximal 25 to 30% with residual 50% stenosis.  She also has an 80% ramus branch stenosis and a proximally a 2 mm vessel which I did not intervene on either.  She has normal LV function.  I suspect her anginal symptoms will improve although she does have a suboptimal PCI result.  Angiomax will continue for 4 hours full dose.  The sheath was removed and a TR band was placed on the right wrist to achieve patent hemostasis.  The patient left the lab in stable condition.  She will be gently hydrated, discharged home in the morning on aspirin and Plavix.  [name]. MD, [facility]  Patient Profile      77 y.o. female with PMH of HTN, anemia and asthma who presented for outpatient cardiac cath noted above.   Assessment & Plan    1. CAD with unstable angina: Underwent cardiac noted above with successful PCI/DES x1 to the pLAD. Had full expansion of the stent except for the proximal portion with 50% stenosis. Also noted to have 80% ramus branch but did not intervene with plans for medical management. Normal LV function on cath. She was continued on angiomax for 4 hours post cath. Plan for DAPT with ASA/plavix for at least one year. Needs to work with cardiac rehab today.   2. HTN: blood pressures are elevated. Will add low dose coreg today and follow blood pressure. Continue lisinopril.   3. HL: On zocor at home. Denies any issues with statins in the past. Will switch to high dose.  -- check lipids in am  4. Anemia: Reports recently having a physical and FOBT that was heme +. Planned to see GI, but needed pre op clearance, which led to cath. PLan was for colonoscopy. Hgb baseline appears around 10 back in 2018. Hemoglobin 9.2 back in 05/06/18. Noted at 8.5 yesterday prior to cath and down to 7.8 today. No dyspnea or chest pain reported. Now on DAPT.  -- will recheck CBC in the am. Consider transfusion if further decline.   Signed, Laverda Page, NP  05/31/2018, 8:06 AM  Pager # [phone]   For questions or updates, please contact [facility] Please consult [email or web address] for contact info under Cardiology/STEMI.  I have personally seen and examined this patient. I agree with the assessment and plan as outlined above.  She was admitted following her cardiac cath/PCI. Dr. Allyson Sabal placed a drug eluting stent in her proximal LAD yesterday. She has had no chest pain overnight. She is on ASA/Plavix. She has known GI bleeding. Anemia is worsened today. BP is elevated. Will monitor another 24 hours due to anemia and hypertension. If further decline in H/H over next 24 hours, may need a transfusion  tomorrow. GI workup pending as an outpatient. Will add low dose Coreg today for better BP control. Continue Lisinopril.   Verne Carrow 05/31/2018 9:49 AM

## 2018-05-31 NOTE — Progress Notes (Signed)
CARDIAC REHAB PHASE I   PRE:  Rate/Rhythm: 102 ST  BP:  Sitting: 165/61      SaO2: 97 RA  MODE:  Ambulation: 400 ft 118 peak HR  POST:  Rate/Rhythm: 104 ST  BP:  Sitting: 188/68    SaO2: 95 RA   Pt ambulated 445ft in hallway assist of one with steady gait. Pt c/o some SOB, denied CP. Pt educated on importance of ASA, Plavix, statin and NTG. Stent card at bedside. Pt given heart healthy diet. Reviewed restrictions and exercise guidelines. Will refer to [facility] [place]. Will continue to follow  [id number] Reynold Bowen, RN BSN 05/31/2018 9:35 AM

## 2018-06-01 ENCOUNTER — Telehealth: Payer: Self-pay | Admitting: Cardiology

## 2018-06-01 DIAGNOSIS — I25110 Atherosclerotic heart disease of native coronary artery with unstable angina pectoris: Secondary | ICD-10-CM | POA: Diagnosis not present

## 2018-06-01 DIAGNOSIS — I25119 Atherosclerotic heart disease of native coronary artery with unspecified angina pectoris: Secondary | ICD-10-CM | POA: Diagnosis not present

## 2018-06-01 LAB — CBC
HCT: 29.9 % — ABNORMAL LOW (ref 36.0–46.0)
Hemoglobin: 8.1 g/dL — ABNORMAL LOW (ref 12.0–15.0)
MCH: 21 pg — AB (ref 26.0–34.0)
MCHC: 27.1 g/dL — ABNORMAL LOW (ref 30.0–36.0)
MCV: 77.7 fL — ABNORMAL LOW (ref 80.0–100.0)
Platelets: 196 10*3/uL (ref 150–400)
RBC: 3.85 MIL/uL — ABNORMAL LOW (ref 3.87–5.11)
RDW: 19.1 % — ABNORMAL HIGH (ref 11.5–15.5)
WBC: 5.6 10*3/uL (ref 4.0–10.5)
nRBC: 0 % (ref 0.0–0.2)

## 2018-06-01 LAB — LIPID PANEL
Cholesterol: 135 mg/dL (ref 0–200)
HDL: 49 mg/dL (ref >40)
LDL CALC: 56 mg/dL (ref 0–99)
Total CHOL/HDL Ratio: 2.8 RATIO
Triglycerides: 150 mg/dL — ABNORMAL HIGH (ref <150)
VLDL: 30 mg/dL (ref 0–40)

## 2018-06-01 MED ORDER — ATORVASTATIN CALCIUM 80 MG PO TABS: 80.0000 mg | ORAL_TABLET | Freq: Every day | ORAL | 2 refills | Status: DC

## 2018-06-01 MED ORDER — NITROGLYCERIN 0.4 MG SL SUBL: 0.4000 mg | SUBLINGUAL_TABLET | SUBLINGUAL | 2 refills | Status: DC | PRN

## 2018-06-01 MED ORDER — CARVEDILOL 3.125 MG PO TABS: 3.1250 mg | ORAL_TABLET | Freq: Two times a day (BID) | ORAL | 2 refills | Status: DC

## 2018-06-01 MED ORDER — CLOPIDOGREL BISULFATE 75 MG PO TABS: 75.0000 mg | ORAL_TABLET | Freq: Every day | ORAL | 2 refills | Status: DC

## 2018-06-01 MED FILL — ATORVASTATIN CALCIUM 80 MG: 80 | 30 days supply | Qty: 30 | Fill #0 | Status: TO

## 2018-06-01 MED FILL — CARVEDILOL 3.125 MG TABLET: 3.125 | 30 days supply | Qty: 60 | Fill #0 | Status: TO

## 2018-06-01 MED FILL — NITROGLYCERIN 0.4 MG TAB SL: 0.4 | 25 days supply | Qty: 25 | Fill #0

## 2018-06-01 MED FILL — CLOPIDOGREL 75 MG TABLET: 75 | 30 days supply | Qty: 30 | Fill #0 | Status: TO

## 2018-06-01 NOTE — Progress Notes (Signed)
Discharge instructions reviewed with the patient to include medications, Diet, follow up appointments, Activity and radial site care.  Patient voices understanding to teaching.  Patient has received medications from [facility].  To door via wheelchair. Home via POV with her daughter driving.

## 2018-06-01 NOTE — Telephone Encounter (Signed)
TOC Patient-  Please call Patient-  Pt has an appointment with Corine Shelter on 06-08-18.

## 2018-06-01 NOTE — Progress Notes (Signed)
CARDIAC REHAB PHASE I   PRE:  Rate/Rhythm: 95 SR  BP:  Sitting: 142/56      SaO2: 98 RA  MODE:  Ambulation: 600 ft 119 peak HR  POST:  Rate/Rhythm: 102 ST  BP:  Sitting: 156/66    SaO2: 97 RA   Pt ambulated 648ft in hallway standby assist with steady gait. Pt denies CP or SOB. Pt states she feels better today than yesterday. Pt denies any questions on yesterdays education. Pt referred to [facility] [place]. Pt for d/c today.  [id number] Reynold Bowen, RN BSN 06/01/2018 8:49 AM

## 2018-06-01 NOTE — Discharge Summary (Addendum)
Discharge Summary    Patient ID: Monique [name],  MRN: [id number], DOB/AGE: May 22, 1941 77 y.o.  Admit date: 05/30/2018 Discharge date: 06/01/2018  Primary Care Provider: [name] Primary Cardiologist: Dr. Allyson Sabal   Discharge Diagnoses    Active Problems:   CAD (coronary artery disease)   Hypertension   Hyperlipidemia   Allergies Allergies  Allergen Reactions  . Morphine And Related Anxiety  . Pregabalin     anxiety    Diagnostic Studies/Procedures    Cath: 05/30/18   Ost 1st Mrg to 1st Mrg lesion is 40% stenosed.  Ramus lesion is 80% stenosed.  Ost LAD to Prox LAD lesion is 90% stenosed.  A stent was successfully placed.  Post intervention, there is a 50% residual stenosis.  The left ventricular systolic function is normal.  LV end diastolic pressure is normal.  The left ventricular ejection fraction is 55-65% by visual estimate.   Monique [name] is a 77 y.o. female  IMPRESSION: PCI and drug-eluting stenting of a calcified segmental proximal LAD lesion with full expansion of the stent except in the proximal 25 to 30% with residual 50% stenosis.  She also has an 80% ramus branch stenosis and a proximally a 2 mm vessel which I did not intervene on either.  She has normal LV function.  I suspect her anginal symptoms will improve although she does have a suboptimal PCI result.  Angiomax will continue for 4 hours full dose.  The sheath was removed and a TR band was placed on the right wrist to achieve patent hemostasis.  The patient left the lab in stable condition.  She will be gently hydrated, discharged home in the morning on aspirin and Plavix.  [name]. MD, [facility] _____________   History of Present Illness     77 y.o. female with PMH of HTN, and HL who is followed by Dr. Allyson Sabal as an outpatient. 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 She was referred by [name] nurse practitioner for a symptomatic sinus  tachycardia. Her cardiac risk factors include treated hypertension and hyperlipidemia and has a family history of heart disease with father who died of a myocardial function and age 8. She had never had a heart attack or stroke. She denied chest pain or shortness of breath. She does exercise and does yoga as well. She has reactive airways disease.  Since she was last seen by Dr. Allyson Sabal back in January, over the last 6 months she had developed progressive exertional angina and dyspnea on exertion with left upper extremity radiation which is fairly reproducible. She was referred to GI as she was found to be anemic and had a + FOBT by PCP. But with her anginal symptoms, she was referred to cardiology for clearance prior to GI procedure. Given her progressive symptoms she was set up for outpatient cardiac cath.   Hospital Course     Underwent cardiac cath noted above with PCI/DES x1 to the pLAD. Had full expansion of the stent except for the proximal portion with 50% stenosis. Also noted to have 80% ramus branch but did not intervene with plans for medical management. Normal LV function on cath. She was continued on angiomax for 4 hours post cath. Plan for DAPT with ASA/plavix for at least one year. Hgb noted at 8.5 prior to cath, and dropped to 7.8 post. Denied any active bleeding. She was not symptomatic, but decision made to monitor overnight and follow H/H. Blood pressures were elevated,  and coreg added to her home lisinopril. She was switched to high dose statin from Zocor. LDL noted at 56. Worked well with cardiac rehab without recurrent chest pain. H/H was 8.1/29.9 the following morning.   General: Well developed, well nourished, female appearing in no acute distress. Head: Normocephalic, atraumatic.  Neck: Supple without bruits, JVD. Lungs:  Resp regular and unlabored, CTA. Heart: RRR, S1, S2, no S3, S4, or murmur; no rub. Abdomen: Soft, non-tender, non-distended with normoactive bowel sounds.    Extremities: No clubbing, cyanosis, edema. Distal pedal pulses are 2+ bilaterally. R radial cath site stable without bruising or hematoma Neuro: Alert and oriented X 3. Moves all extremities spontaneously. Psych: Normal affect.  Monique [name] was seen by Dr. Clifton James and determined stable for discharge home. Follow up in the office has been arranged. Medications are listed below.   _____________  Discharge Vitals Blood pressure 129/70, pulse 87, temperature 97.7 F (36.5 C), resp. rate 16, height 5' 5.5" (1.664 m), weight 86 kg, SpO2 97 %.  Filed Weights   05/30/18 0752 05/31/18 0439 06/01/18 0650  Weight: 86.2 kg 85.8 kg 86 kg    Labs & Radiologic Studies    CBC Recent Labs    05/31/18 0611 06/01/18 0345  WBC 5.3 5.6  HGB 7.8* 8.1*  HCT 28.1* 29.9*  MCV 75.9* 77.7*  PLT 202 196   Basic Metabolic Panel Recent Labs    91/47/82 0611  NA 141  K 3.9  CL 110  CO2 25  GLUCOSE 133*  BUN 16  CREATININE 0.73  CALCIUM 8.7*   Liver Function Tests No results for input(s): AST, ALT, ALKPHOS, BILITOT, PROT, ALBUMIN in the last 72 hours. No results for input(s): LIPASE, AMYLASE in the last 72 hours. Cardiac Enzymes No results for input(s): CKTOTAL, CKMB, CKMBINDEX, TROPONINI in the last 72 hours. BNP Invalid input(s): POCBNP D-Dimer No results for input(s): DDIMER in the last 72 hours. Hemoglobin A1C No results for input(s): HGBA1C in the last 72 hours. Fasting Lipid Panel Recent Labs    06/01/18 0345  CHOL 135  HDL 49  LDLCALC 56  TRIG 150*  CHOLHDL 2.8   Thyroid Function Tests No results for input(s): TSH, T4TOTAL, T3FREE, THYROIDAB in the last 72 hours.  Invalid input(s): FREET3 _____________  No results found. Disposition   Pt is being discharged home today in good condition.  Follow-up Plans & Appointments   Follow-up Information    Abelino Derrick, PA-C Follow up on 06/08/2018.   Specialties:  Cardiology, Radiology Why:  at 8:30am for your  follow up appt.  Contact information: [address] [place] [address] [phone]          Discharge Instructions    Amb Referral to Cardiac Rehabilitation   Complete by:  As directed    Diagnosis:  Coronary Stents   Call MD for:  redness, tenderness, or signs of infection (pain, swelling, redness, odor or green/yellow discharge around incision site)   Complete by:  As directed    Diet - low sodium heart healthy   Complete by:  As directed    Discharge instructions   Complete by:  As directed    Radial Site Care Refer to this sheet in the next few weeks. These instructions provide you with information on caring for yourself after your procedure. Your caregiver may also give you more specific instructions. Your treatment has been planned according to current medical practices, but problems sometimes occur. Call your caregiver  if you have any problems or questions after your procedure. HOME CARE INSTRUCTIONS You may shower the day after the procedure.Remove the bandage (dressing) and gently wash the site with plain soap and water.Gently pat the site dry.  Do not apply powder or lotion to the site.  Do not submerge the affected site in water for 3 to 5 days.  Inspect the site at least twice daily.  Do not flex or bend the affected arm for 24 hours.  No lifting over 5 pounds (2.3 kg) for 5 days after your procedure.  Do not drive home if you are discharged the same day of the procedure. Have someone else drive you.  You may drive 24 hours after the procedure unless otherwise instructed by your caregiver.  What to expect: Any bruising will usually fade within 1 to 2 weeks.  Blood that collects in the tissue (hematoma) may be painful to the touch. It should usually decrease in size and tenderness within 1 to 2 weeks.  SEEK IMMEDIATE MEDICAL CARE IF: You have unusual pain at the radial site.  You have redness, warmth, swelling, or pain at the radial site.  You have  drainage (other than a small amount of blood on the dressing).  You have chills.  You have a fever or persistent symptoms for more than 72 hours.  You have a fever and your symptoms suddenly get worse.  Your arm becomes pale, cool, tingly, or numb.  You have heavy bleeding from the site. Hold pressure on the site.   PLEASE DO NOT MISS ANY DOSES OF YOUR PLAVIX!!!!! Also keep a log of you blood pressures and bring back to your follow up appt. Please call the office with any questions.   Patients taking blood thinners should generally stay away from medicines like ibuprofen, Advil, Motrin, naproxen, and Aleve due to risk of stomach bleeding. You may take Tylenol as directed or talk to your primary doctor about alternatives.   Increase activity slowly   Complete by:  As directed      Discharge Medications     Medication List    STOP taking these medications   naproxen sodium 220 MG tablet Commonly known as:  ALEVE   simvastatin 40 MG tablet Commonly known as:  ZOCOR     TAKE these medications   aspirin EC 81 MG tablet Take 81 mg by mouth daily.   atorvastatin 80 MG tablet Commonly known as:  LIPITOR Take 1 tablet (80 mg total) by mouth daily at 6 PM.   carvedilol 3.125 MG tablet Commonly known as:  COREG Take 1 tablet (3.125 mg total) by mouth 2 (two) times daily with a meal.   cetirizine 10 MG tablet Commonly known as:  ZYRTEC Take 10 mg by mouth at bedtime.   clopidogrel 75 MG tablet Commonly known as:  PLAVIX Take 1 tablet (75 mg total) by mouth daily with breakfast.   lisinopril 40 MG tablet Commonly known as:  PRINIVIL,ZESTRIL Take 40 mg by mouth daily.   magnesium oxide 400 MG tablet Commonly known as:  MAG-OX Take 400 mg by mouth daily.   nitroGLYCERIN 0.4 MG SL tablet Commonly known as:  NITROSTAT Place 1 tablet (0.4 mg total) under the tongue every 5 (five) minutes as needed.   oxyCODONE-acetaminophen 7.5-325 MG tablet Commonly known as:   PERCOCET Take 1 tablet by mouth every 4 (four) hours.   pantoprazole 40 MG tablet Commonly known as:  PROTONIX Take 1 tablet (40 mg total)  by mouth 2 (two) times daily before a meal.   polyethylene glycol packet Commonly known as:  MIRALAX / GLYCOLAX Take 17 g by mouth 2 (two) times daily. What changed:  when to take this   venlafaxine XR 75 MG 24 hr capsule Commonly known as:  EFFEXOR-XR Take 225 mg by mouth at bedtime.   VITAMIN D PO Take 1 tablet by mouth daily.        Acute coronary syndrome (MI, NSTEMI, STEMI, etc) this admission?: No.    Outstanding Labs/Studies   FLP/LFTs in 6 weeks if tolerating statin. CBC at follow up appt.   Duration of Discharge Encounter   Greater than 30 minutes including physician time.  Signed, Laverda Page NP-C 06/01/2018, 8:25 AM   I have personally seen and examined this patient. I agree with the assessment and plan as outlined above.  She is doing well this am. NO chest pain. S/p LAD stent 2 days ago. H/H stable. Discharge home today on ASA and Plavix. Close follow up with CBC.   Verne Carrow 06/01/2018 8:58 AM

## 2018-06-01 NOTE — Telephone Encounter (Signed)
Patient discharged TODAY 06/01/18 First TOC attempt should be 10/31

## 2018-06-03 NOTE — Telephone Encounter (Signed)
Patient contacted regarding discharge from [facility] on 06/01/18.  Patient understands to follow up with provider Corine Shelter, PA-C on 06/08/18 at 8:30 AM at [facility]. Patient understands discharge instructions? Yes Patient understands medications and regiment? Yes Patient understands to bring all medications to this visit? Yes

## 2018-06-06 ENCOUNTER — Telehealth (HOSPITAL_COMMUNITY): Payer: Self-pay

## 2018-06-06 NOTE — Telephone Encounter (Signed)
Pt insurance is active and benefits verified through Medicare A/B. Co-pay $0.00, DED $185.00/$185.00 met, out of pocket $0.00/$0.00 met, co-insurance 20%. No pre-authorization. Passport, 06/06/18 _0 :39AM, REF# [id number]  2ndary insurance is active and benefits verified through [facility]. Co-pay $0.00, DED $0.00/$0.00 met, out of pocket $0.00/$0.00 met, co-insurance 0%. No pre-authorization. Passport, 06/06/18 _1 :41AM, REF# [id number]

## 2018-06-06 NOTE — Telephone Encounter (Signed)
Attempted to call patient in regards to Cardiac Rehab - LM on VM 

## 2018-06-08 ENCOUNTER — Ambulatory Visit (INDEPENDENT_AMBULATORY_CARE_PROVIDER_SITE_OTHER): Payer: Medicare Other | Admitting: Cardiology

## 2018-06-08 ENCOUNTER — Encounter: Payer: Self-pay | Admitting: Cardiology

## 2018-06-08 VITALS — BP 135/72 | HR 104 | Resp 16 | Ht 65.0 in | Wt 192.2 lb

## 2018-06-08 DIAGNOSIS — I2510 Atherosclerotic heart disease of native coronary artery without angina pectoris: Secondary | ICD-10-CM | POA: Diagnosis not present

## 2018-06-08 DIAGNOSIS — R238 Other skin changes: Secondary | ICD-10-CM

## 2018-06-08 DIAGNOSIS — D508 Other iron deficiency anemias: Secondary | ICD-10-CM

## 2018-06-08 DIAGNOSIS — D649 Anemia, unspecified: Secondary | ICD-10-CM | POA: Insufficient documentation

## 2018-06-08 DIAGNOSIS — I10 Essential (primary) hypertension: Secondary | ICD-10-CM

## 2018-06-08 DIAGNOSIS — E785 Hyperlipidemia, unspecified: Secondary | ICD-10-CM | POA: Diagnosis not present

## 2018-06-08 DIAGNOSIS — M7989 Other specified soft tissue disorders: Secondary | ICD-10-CM

## 2018-06-08 DIAGNOSIS — Z9861 Coronary angioplasty status: Secondary | ICD-10-CM

## 2018-06-08 LAB — CBC
Hematocrit: 27.9 % — ABNORMAL LOW (ref 34.0–46.6)
Hemoglobin: 8.5 g/dL — ABNORMAL LOW (ref 11.1–15.9)
MCH: 21.7 pg — ABNORMAL LOW (ref 26.6–33.0)
MCHC: 30.5 g/dL — ABNORMAL LOW (ref 31.5–35.7)
MCV: 71 fL — ABNORMAL LOW (ref 79–97)
Platelets: 235 10*3/uL (ref 150–450)
RBC: 3.92 x10E6/uL (ref 3.77–5.28)
RDW: 19.5 % — ABNORMAL HIGH (ref 12.3–15.4)
WBC: 6.2 10*3/uL (ref 3.4–10.8)

## 2018-06-08 LAB — IRON,TIBC AND FERRITIN PANEL
Ferritin: 10 ng/mL — ABNORMAL LOW (ref 15–150)
Iron Saturation: 6 % — CL (ref 15–55)
Iron: 24 ug/dL — ABNORMAL LOW (ref 27–139)
Total Iron Binding Capacity: 432 ug/dL (ref 250–450)
UIBC: 408 ug/dL — ABNORMAL HIGH (ref 118–369)

## 2018-06-08 MED ORDER — ISOSORBIDE MONONITRATE ER 30 MG PO TB24: 15.0000 mg | ORAL_TABLET | Freq: Every day | ORAL | 1 refills | Status: DC

## 2018-06-08 MED ORDER — FERROUS SULFATE 325 (65 FE) MG PO TABS: 325.0000 mg | ORAL_TABLET | Freq: Two times a day (BID) | ORAL | 2 refills | Status: DC

## 2018-06-08 NOTE — Assessment & Plan Note (Signed)
RUE edema, ecchymosis and warmth-r/o DVT

## 2018-06-08 NOTE — Patient Instructions (Signed)
Medication Instructions:  START Ferrous Sulfate Take 1 tablet twice a day START Imdur 15mg  Tablet comes in 30mg  Break 30mg  tablet in half and take half tablet once a day If you need a refill on your cardiac medications before your next appointment, please call your pharmacy.   Lab work: Your physician recommends that you return for lab work in: TODAY-CBC, FERRITIN LEVEL, TIBC, SERUM LEVEL If you have labs (blood work) drawn today and your tests are completely normal, you will receive your results only by: Marland Kitchen MyChart Message (if you have MyChart) OR . A paper copy in the mail If you have any lab test that is abnormal or we need to change your treatment, we will call you to review the results.  Testing/Procedures: Your physician has requested that you have a upper extremity venous duplex. This test is an ultrasound of the veins in the arms. It looks at venous blood flow that carries blood from the heart to the arms.  Allow thirty minutes for an Upper Venous exam. There are no restrictions or special instructions. [address]  Follow-Up: At [facility], you and your health needs are our priority.  As part of our continuing mission to provide you with exceptional heart care, we have created designated Provider Care Teams.  These Care Teams include your primary Cardiologist (physician) and Advanced Practice Providers (APPs -  Physician Assistants and Nurse Practitioners) who all work together to provide you with the care you need, when you need it. Your physician recommends that you schedule a follow-up appointment in: 3-4 Weeks with Dr Allyson Sabal ONLY.  Any Other Special Instructions Will Be Listed Below (If Applicable).

## 2018-06-08 NOTE — Assessment & Plan Note (Signed)
Controlled.  

## 2018-06-08 NOTE — Assessment & Plan Note (Signed)
C/P as an OP- cath 05/30/18 revealed pLAD disease treated with PCI/ DES (50% residual stenosis) NL LVF

## 2018-06-08 NOTE — Assessment & Plan Note (Signed)
LDL 56 Oct 2019

## 2018-06-08 NOTE — Progress Notes (Signed)
06/08/2018 Monique [name]   [birth year]  [id number]  Primary Physician [name], FNP Primary Cardiologist: Dr Allyson Sabal  HPI: Patient is a 77 year old female followed by Dr. Allyson Sabal.  Her husband was a longtime patient of Dr. Benay Spice.  Earlier this fall the patient developed exertional chest pain.  She saw her primary care provider about this.  She was also noted to be anemic with heme positive stool.  She was scheduled to see Dr. Loreta Ave for GI evaluation.  In the meantime she saw Dr. Gery Pray who is concerned about her symptoms and set her up for diagnostic catheterization.  This was done 05/30/2018.  Revealed a high-grade proximal LAD stenosis that was treated with PCI and drug-eluting stent placement.  She did have a 50% in-stent restenosis residual narrowing.  She also had an 80% small ramus intermedius which was to be treated medically.  Her LV function was normal.  Her hemoglobin when she left the hospital was 8.1.    She is in the office today for follow-up.  Since discharge she has noted some fatigue with exertion.  She admits she thought she would feel better.  She has not had chest pain like she had prior to her stent but she still has exertional symptoms.   Current Outpatient Medications  Medication Sig Dispense Refill  . aspirin EC 81 MG tablet Take 81 mg by mouth daily.    Marland Kitchen atorvastatin (LIPITOR) 80 MG tablet Take 1 tablet (80 mg total) by mouth daily at 6 PM. 90 tablet 2  . carvedilol (COREG) 3.125 MG tablet Take 1 tablet (3.125 mg total) by mouth 2 (two) times daily with a meal. 60 tablet 2  . cetirizine (ZYRTEC) 10 MG tablet Take 10 mg by mouth at bedtime.     . Cholecalciferol (VITAMIN D PO) Take 1 tablet by mouth daily.    . clopidogrel (PLAVIX) 75 MG tablet Take 1 tablet (75 mg total) by mouth daily with breakfast. 90 tablet 2  . lisinopril (PRINIVIL,ZESTRIL) 40 MG tablet Take 40 mg by mouth daily.    . magnesium oxide (MAG-OX) 400 MG tablet Take 400 mg by mouth daily.      . nitroGLYCERIN (NITROSTAT) 0.4 MG SL tablet Place 1 tablet (0.4 mg total) under the tongue every 5 (five) minutes as needed. 25 tablet 2  . oxyCODONE-acetaminophen (PERCOCET) 7.5-325 MG tablet Take 1 tablet by mouth every 4 (four) hours.     . pantoprazole (PROTONIX) 40 MG tablet Take 1 tablet (40 mg total) by mouth 2 (two) times daily before a meal. 30 tablet 0  . polyethylene glycol (MIRALAX / GLYCOLAX) packet Take 17 g by mouth 2 (two) times daily. (Patient taking differently: Take 17 g by mouth at bedtime. ) 14 each 0  . venlafaxine XR (EFFEXOR-XR) 75 MG 24 hr capsule Take 225 mg by mouth at bedtime.   0  . ferrous sulfate (FERROUSUL) 325 (65 FE) MG tablet Take 1 tablet (325 mg total) by mouth 2 (two) times daily with a meal. 180 tablet 2  . isosorbide mononitrate (IMDUR) 30 MG 24 hr tablet Take 0.5 tablets (15 mg total) by mouth daily. 90 tablet 1   No current facility-administered medications for this visit.     Allergies  Allergen Reactions  . Morphine And Related Anxiety  . Pregabalin     anxiety    Past Medical History:  Diagnosis Date  . Anemia     mild as per PCP  . Arthritis  hip/ s/p recent shoulder fracture 8/12- RIGHT  . Asthma   . Coronary artery disease 05/30/2018   LAD PCI/DES  . GI bleeding   . Hiatal hernia   . Humerus fracture    with wrist on right side  . Hypertension    hypercholesterolemia/  EKG on chart with clearance and note 06/18/11  [facility]  . Pneumonia     Social History   Socioeconomic History  . Marital status: Widowed    Spouse name: Not on file  . Number of children: Not on file  . Years of education: Not on file  . Highest education level: Not on file  Occupational History  . Not on file  Social Needs  . Financial resource strain: Not on file  . Food insecurity:    Worry: Not on file    Inability: Not on file  . Transportation needs:    Medical: Not on file    Non-medical: Not on file  Tobacco Use  . Smoking status:  Never Smoker  . Smokeless tobacco: Never Used  Substance and Sexual Activity  . Alcohol use: Yes    Comment: socially- 2 x year  . Drug use: No  . Sexual activity: Not on file  Lifestyle  . Physical activity:    Days per week: Not on file    Minutes per session: Not on file  . Stress: Not on file  Relationships  . Social connections:    Talks on phone: Not on file    Gets together: Not on file    Attends religious service: Not on file    Active member of club or organization: Not on file    Attends meetings of clubs or organizations: Not on file    Relationship status: Not on file  . Intimate partner violence:    Fear of current or ex partner: Not on file    Emotionally abused: Not on file    Physically abused: Not on file    Forced sexual activity: Not on file  Other Topics Concern  . Not on file  Social History Narrative  . Not on file     Family History  Problem Relation Age of Onset  . CAD Father 51       PCI     Review of Systems: General: negative for chills, fever, night sweats or weight changes.  Cardiovascular: negative for chest pain, dyspnea on exertion, edema, orthopnea, palpitations, paroxysmal nocturnal dyspnea or shortness of breath Dermatological: negative for rash Respiratory: negative for cough or wheezing Urologic: negative for hematuria Abdominal: negative for nausea, vomiting, diarrhea, bright red blood per rectum, melena, or hematemesis Neurologic: negative for visual changes, syncope, or dizziness All other systems reviewed and are otherwise negative except as noted above.    Blood pressure 135/72, pulse (!) 104, resp. rate 16, height 5\' 5"  (1.651 m), weight 192 lb 3.2 oz (87.2 kg), SpO2 96 %.  General appearance: alert, cooperative, no distress and mildly obese Neck: no carotid bruit and no JVD Lungs: clear to auscultation bilaterally Heart: regular rate and rhythm and HR 104 Extremities: she has some swelling, ecchymosis, and warmth to  her Rt forarm and upper arm. Her radial site is without hematoms Skin: pale, warm, dry Neurologic: Grossly normal  EKG NSR-96, TWI V2-V4  ASSESSMENT AND PLAN:   CAD S/P percutaneous coronary angioplasty C/P as an OP- cath 05/30/18 revealed pLAD disease treated with PCI/ DES (50% residual stenosis) NL LVF  Dyslipidemia, goal LDL below 70  LDL 56 Oct 2019  Essential hypertension Controlled  Anemia Etiology not yet determined- work up put on hold for cardiac evaluation.   Redness and swelling of upper arm RUE edema, ecchymosis and warmth-r/o DVT   PLAN  I suggested her symptoms may be secondary to her anemia. She is on BID PPI. I have ordered a CBC, Fe, TIBC, and ferrite and will add PO Iron. GI work will have to be delayed, will discuss timing with Dr Allyson Sabal. I have also added low dose Imdur to her current medications. She does have some swelling and warmth in her Rt arm- ? DVT- will check venous dopplers.  She should f/u with Dr Allyson Sabal in the next few weeks.   Corine Shelter PA-C 06/08/2018 9:18 AM

## 2018-06-08 NOTE — Assessment & Plan Note (Signed)
Etiology not yet determined- work up put on hold for cardiac evaluation.

## 2018-06-09 ENCOUNTER — Telehealth: Payer: Self-pay

## 2018-06-09 ENCOUNTER — Other Ambulatory Visit: Payer: Self-pay | Admitting: Cardiology

## 2018-06-09 DIAGNOSIS — R238 Other skin changes: Principal | ICD-10-CM

## 2018-06-09 DIAGNOSIS — M7989 Other specified soft tissue disorders: Secondary | ICD-10-CM

## 2018-06-09 NOTE — Telephone Encounter (Signed)
-----   Message from Abelino Derrick, [place] sent at 06/08/2018  1:31 PM EST ----- Please let Ms Dec know her CBC was stable- Hgb 8.5, slowly increasing but still anemic. Continue with Iron therapy as ordered.  Corine Shelter PA-C 06/08/2018 1:32 PM

## 2018-06-09 NOTE — Telephone Encounter (Signed)
Called patient, advised of results from [facility] and to continue iron medication. Patient verbalized understanding.

## 2018-06-13 ENCOUNTER — Ambulatory Visit (HOSPITAL_COMMUNITY)
Admission: RE | Admit: 2018-06-13 | Discharge: 2018-06-13 | Disposition: A | Discharge status: Home / Self Care | Payer: Medicare Other | Source: Ambulatory Visit | Attending: Cardiology | Admitting: Cardiology

## 2018-06-13 ENCOUNTER — Telehealth (HOSPITAL_COMMUNITY): Payer: Self-pay

## 2018-06-13 DIAGNOSIS — R238 Other skin changes: Secondary | ICD-10-CM | POA: Diagnosis not present

## 2018-06-13 DIAGNOSIS — M7989 Other specified soft tissue disorders: Secondary | ICD-10-CM | POA: Diagnosis present

## 2018-06-13 NOTE — Telephone Encounter (Signed)
Called and spoke with patient in regards to Cardiac Rehab - Scheduled orientation on 08/11/18 at 1:30pm. Pt will attend the 1:15pm exc class. Mailed packet.

## 2018-07-06 ENCOUNTER — Telehealth (HOSPITAL_COMMUNITY): Payer: Self-pay

## 2018-07-06 NOTE — Telephone Encounter (Signed)
Attempted to call pt in regards to her upcoming CR apportionment. LMTCB

## 2018-07-06 NOTE — Telephone Encounter (Signed)
Pt returned CR phone call and will come in for orientation 07/12/18 @ 8:30AM and will attend the 1:15PM exercise class.

## 2018-07-08 NOTE — Progress Notes (Signed)
Priscille Kluvernn T Houlton 77 y.o. female DOB 02/23/1941 MRN [id number]       Nutrition  No diagnosis found. Past Medical History:  Diagnosis Date  . Anemia     mild as per PCP  . Arthritis    hip/ s/p recent shoulder fracture 8/12- RIGHT  . Asthma   . Coronary artery disease 05/30/2018   LAD PCI/DES  . GI bleeding   . Hiatal hernia   . Humerus fracture    with wrist on right side  . Hypertension    hypercholesterolemia/  EKG on chart with clearance and note 06/18/11  [place]chi  . Pneumonia    Meds reviewed.     Current Outpatient Medications (Cardiovascular):  .  atorvastatin (LIPITOR) 80 MG tablet, Take 1 tablet (80 mg total) by mouth daily at 6 PM. .  carvedilol (COREG) 3.125 MG tablet, Take 1 tablet (3.125 mg total) by mouth 2 (two) times daily with a meal. .  isosorbide mononitrate (IMDUR) 30 MG 24 hr tablet, Take 0.5 tablets (15 mg total) by mouth daily. Marland Kitchen.  lisinopril (PRINIVIL,ZESTRIL) 40 MG tablet, Take 40 mg by mouth daily. .  nitroGLYCERIN (NITROSTAT) 0.4 MG SL tablet, Place 1 tablet (0.4 mg total) under the tongue every 5 (five) minutes as needed.  Current Outpatient Medications (Respiratory):  .  cetirizine (ZYRTEC) 10 MG tablet, Take 10 mg by mouth at bedtime.   Current Outpatient Medications (Analgesics):  .  aspirin EC 81 MG tablet, Take 81 mg by mouth daily. Marland Kitchen.  oxyCODONE-acetaminophen (PERCOCET) 7.5-325 MG tablet, Take 1 tablet by mouth every 4 (four) hours.   Current Outpatient Medications (Hematological):  .  clopidogrel (PLAVIX) 75 MG tablet, Take 1 tablet (75 mg total) by mouth daily with breakfast. .  ferrous sulfate (FERROUSUL) 325 (65 FE) MG tablet, Take 1 tablet (325 mg total) by mouth 2 (two) times daily with a meal.  Current Outpatient Medications (Other):  Marland Kitchen.  Cholecalciferol (VITAMIN D PO), Take 1 tablet by mouth daily. .  magnesium oxide (MAG-OX) 400 MG tablet, Take 400 mg by mouth daily. .  pantoprazole (PROTONIX) 40 MG tablet, Take 1 tablet (40 mg  total) by mouth 2 (two) times daily before a meal. .  polyethylene glycol (MIRALAX / GLYCOLAX) packet, Take 17 g by mouth 2 (two) times daily. (Patient taking differently: Take 17 g by mouth at bedtime. ) .  venlafaxine XR (EFFEXOR-XR) 75 MG 24 hr capsule, Take 225 mg by mouth at bedtime.    HT: Ht Readings from Last 1 Encounters:  06/08/18 5\' 5"  (1.651 m)    WT: Wt Readings from Last 5 Encounters:  06/08/18 192 lb 3.2 oz (87.2 kg)  06/01/18 189 lb 9.5 oz (86 kg)  05/27/18 190 lb (86.2 kg)  08/20/17 184 lb 9.6 oz (83.7 kg)  08/27/16 179 lb (81.2 kg)     BMI = 31.98 06/08/18   Current tobacco use? No       Labs:  Lipid Panel     Component Value Date/Time   CHOL 135 06/01/2018 0345   TRIG 150 (H) 06/01/2018 0345   HDL 49 06/01/2018 0345   CHOLHDL 2.8 06/01/2018 0345   VLDL 30 06/01/2018 0345   LDLCALC 56 06/01/2018 0345    Lab Results  Component Value Date   HGBA1C 5.9 (H) 08/24/2016   CBG (last 3)  No results for input(s): GLUCAP in the last 72 hours.  Nutrition Diagnosis ? Food-and nutrition-related knowledge deficit related to lack of exposure to information  as related to diagnosis of: ? CVD ? Pre-diabetes ? Overweight  related to excessive energy intake as evidenced by a  BMI 31.98  Nutrition Goal(s):  ? To be determined  Plan:  Pt to attend nutrition classes ? Nutrition I ? Nutrition II ? Portion Distortion  Will provide client-centered nutrition education as part of interdisciplinary care.   Monitor and evaluate progress toward nutrition goal with team.  Ross Marcus, MS, RD, LDN 07/08/2018 1:47 PM

## 2018-07-11 ENCOUNTER — Telehealth (HOSPITAL_COMMUNITY): Payer: Self-pay | Admitting: *Deleted

## 2018-07-11 ENCOUNTER — Telehealth: Payer: Self-pay | Admitting: Cardiovascular Disease

## 2018-07-11 NOTE — Telephone Encounter (Signed)
That's fine to go to cardiac rehab. Needs ROV

## 2018-07-11 NOTE — Telephone Encounter (Signed)
Will forward this note to cardiac rehab. Patient has an appointment Wednesday this week with dr berry.

## 2018-07-11 NOTE — Telephone Encounter (Signed)
Cardiac rehab Called. Patient has hgb  8.5  Wanted to know if patient can continue cardiac rehab with her anemia.    They will have patient not come to tomorrows class until further response from Dr Allyson SabalBerry.   patient has an appointment--07/13/18 CARDIAC REHAB aware will defer to Dr Allyson SabalBERRY

## 2018-07-12 ENCOUNTER — Encounter (HOSPITAL_COMMUNITY): Payer: Self-pay

## 2018-07-12 ENCOUNTER — Ambulatory Visit (HOSPITAL_COMMUNITY)
Admission: RE | Admit: 2018-07-12 | Discharge: 2018-07-12 | Disposition: A | Discharge status: Home / Self Care | Payer: Medicare Other | Source: Ambulatory Visit | Attending: Family Medicine | Admitting: Family Medicine

## 2018-07-12 ENCOUNTER — Encounter (HOSPITAL_COMMUNITY)
Admission: RE | Admit: 2018-07-12 | Discharge: 2018-07-12 | Disposition: A | Discharge status: Home / Self Care | Payer: Medicare Other | Source: Ambulatory Visit | Attending: Cardiovascular Disease | Admitting: Cardiovascular Disease

## 2018-07-12 VITALS — Ht 65.0 in | Wt 189.8 lb

## 2018-07-12 DIAGNOSIS — I2510 Atherosclerotic heart disease of native coronary artery without angina pectoris: Secondary | ICD-10-CM | POA: Diagnosis not present

## 2018-07-12 DIAGNOSIS — E785 Hyperlipidemia, unspecified: Secondary | ICD-10-CM | POA: Insufficient documentation

## 2018-07-12 DIAGNOSIS — Z955 Presence of coronary angioplasty implant and graft: Secondary | ICD-10-CM | POA: Diagnosis not present

## 2018-07-12 DIAGNOSIS — R9431 Abnormal electrocardiogram [ECG] [EKG]: Secondary | ICD-10-CM | POA: Insufficient documentation

## 2018-07-12 DIAGNOSIS — Z888 Allergy status to other drugs, medicaments and biological substances status: Secondary | ICD-10-CM | POA: Insufficient documentation

## 2018-07-12 DIAGNOSIS — R079 Chest pain, unspecified: Secondary | ICD-10-CM | POA: Insufficient documentation

## 2018-07-12 DIAGNOSIS — I10 Essential (primary) hypertension: Secondary | ICD-10-CM | POA: Diagnosis not present

## 2018-07-12 DIAGNOSIS — Z886 Allergy status to analgesic agent status: Secondary | ICD-10-CM | POA: Insufficient documentation

## 2018-07-12 NOTE — Progress Notes (Signed)
Cardiac Rehab Medication Review by a RN   Does the patient  feel that his/her medications are working for him/her?  yes  Has the patient been experiencing any side effects to the medications prescribed?  no  Does the patient measure his/her own blood pressure or blood glucose at home?  yes   Does the patient have any problems obtaining medications due to transportation or finances?   no  Understanding of regimen: good Understanding of indications: good Potential of compliance: good    RN comments:  Pt demonstrates good compliance and understanding of medication regimen. Pt unsure how to use home BP cuff. Pt instructed to bring with her to next scheduled rehab appointment.  Pt verbalized understanding. Deveron FurlongJoann Rion, RN, BSN Cardiac Pulmonary Rehab     Wynonia MustyJoann Hamilton Rion 07/12/2018 11:00 AM

## 2018-07-12 NOTE — Progress Notes (Signed)
OUTPATIENT CARDIAC REHAB  PMH:  05/28/2018 DES LAD   Primary Cardiologist:  Dr. Allyson SabalBerry  Pt arrived at cardiac rehab new patient orientation appointment in usual state of health. Pt lead II rhythm strip revealed Non specific ST changes approximately 1mm.  Pt denies chest pain, however does c/o nausea and headache, which she equates to not sleeping well last night.   Pt reports she did not sleep well. 12 lead EKG performed to rule out acute event. Pt tolerated 6 minute walk test without acute symptoms. pt reports nausea improved. ST segment depression 1-752mm Lead II with walk test. Monique [name], [facility] made aware.  No new orders received. Pt has previously scheduled appointment with Dr. Allyson SabalBerry tomorrow. Strips faxed to his office for review.  Will continue to monitor.  Monique FurlongJoann Bennie Scaff, RN, BSN Cardiac Pulmonary Rehab 07/12/18 11:27 AM

## 2018-07-12 NOTE — Progress Notes (Signed)
Cardiac Individual Treatment Plan  Patient Details  Name: Monique [name] MRN: [id number] Date of Birth: 1940/11/16 Referring Provider:   Flowsheet Row CARDIAC REHAB PHASE II ORIENTATION from 07/12/2018 in [facility]  Referring Provider  Dr. Gwenlyn Found      Initial Encounter Date:  Flowsheet Row CARDIAC REHAB PHASE II ORIENTATION from 07/12/2018 in [facility]  Date  07/12/18      Visit Diagnosis: 05/30/2018 Stented coronary artery  Patient's Home Medications on Admission:  Current Outpatient Medications:  .  aspirin EC 81 MG tablet, Take 81 mg by mouth daily., Disp: , Rfl:  .  atorvastatin (LIPITOR) 80 MG tablet, Take 1 tablet (80 mg total) by mouth daily at 6 PM., Disp: 90 tablet, Rfl: 2 .  carvedilol (COREG) 3.125 MG tablet, Take 1 tablet (3.125 mg total) by mouth 2 (two) times daily with a meal., Disp: 60 tablet, Rfl: 2 .  cetirizine (ZYRTEC) 10 MG tablet, Take 10 mg by mouth at bedtime. , Disp: , Rfl:  .  Cholecalciferol (VITAMIN D PO), Take 1 tablet by mouth daily., Disp: , Rfl:  .  clopidogrel (PLAVIX) 75 MG tablet, Take 1 tablet (75 mg total) by mouth daily with breakfast., Disp: 90 tablet, Rfl: 2 .  ferrous sulfate (FERROUSUL) 325 (65 FE) MG tablet, Take 1 tablet (325 mg total) by mouth 2 (two) times daily with a meal., Disp: 180 tablet, Rfl: 2 .  isosorbide mononitrate (IMDUR) 30 MG 24 hr tablet, Take 0.5 tablets (15 mg total) by mouth daily., Disp: 90 tablet, Rfl: 1 .  lisinopril (PRINIVIL,ZESTRIL) 40 MG tablet, Take 40 mg by mouth daily., Disp: , Rfl:  .  magnesium oxide (MAG-OX) 400 MG tablet, Take 400 mg by mouth daily., Disp: , Rfl:  .  nitroGLYCERIN (NITROSTAT) 0.4 MG SL tablet, Place 1 tablet (0.4 mg total) under the tongue every 5 (five) minutes as needed., Disp: 25 tablet, Rfl: 2 .  oxyCODONE-acetaminophen (PERCOCET) 7.5-325 MG tablet, Take 1 tablet by mouth every 4 (four) hours. , Disp: , Rfl:  .   pantoprazole (PROTONIX) 40 MG tablet, Take 1 tablet (40 mg total) by mouth 2 (two) times daily before a meal., Disp: 30 tablet, Rfl: 0 .  polyethylene glycol (MIRALAX / GLYCOLAX) packet, Take 17 g by mouth 2 (two) times daily. (Patient taking differently: Take 17 g by mouth at bedtime. ), Disp: 14 each, Rfl: 0 .  venlafaxine XR (EFFEXOR-XR) 75 MG 24 hr capsule, Take 225 mg by mouth at bedtime. , Disp: , Rfl: 0  Past Medical History: Past Medical History:  Diagnosis Date  . Anemia     mild as per PCP  . Arthritis    hip/ s/p recent shoulder fracture 8/12- RIGHT  . Asthma   . Coronary artery disease 05/30/2018   LAD PCI/DES  . GI bleeding   . Hiatal hernia   . Humerus fracture    with wrist on right side  . Hypertension    hypercholesterolemia/  EKG on chart with clearance and note 06/18/11  [facility]  . Pneumonia     Tobacco Use: Social History   Tobacco Use  Smoking Status Never Smoker  Smokeless Tobacco Never Used    Labs: Recent Review Flowsheet Data    Labs for ITP Cardiac and Pulmonary Rehab Latest Ref Rng & Units 08/24/2016 06/01/2018   Cholestrol 0 - 200 mg/dL - 135   LDLCALC 0 - 99 mg/dL - 56  HDL >40 mg/dL - 49   Trlycerides <150 mg/dL - 150(H)   Hemoglobin A1c 4.8 - 5.6 % 5.9(H) -      Capillary Blood Glucose: No results found for: GLUCAP   Exercise Target Goals: Exercise Program Goal: Individual exercise prescription set using results from initial 6 min walk test and THRR while considering  patient's activity barriers and safety.   Exercise Prescription Goal: Initial exercise prescription builds to 30-45 minutes a day of aerobic activity, 2-3 days per week.  Home exercise guidelines will be given to patient during program as part of exercise prescription that the participant will acknowledge.  Activity Barriers & Risk Stratification: Activity Barriers & Cardiac Risk Stratification - 07/12/18 1052    Activity Barriers & Cardiac Risk Stratification           Cardiac Risk Stratification  High           6 Minute Walk: 6 Minute Walk    6 Minute Walk    Row Name 07/12/18 1038   Phase  Initial   Distance  976 feet   Walk Time  6 minutes   # of Rest Breaks  1   MPH  1.9   METS  1.9   RPE  13   VO2 Peak  6.65   Symptoms  Yes (comment)   Comments  Knee and back pain   Resting HR  98 bpm   Resting BP  108/70   Resting Oxygen Saturation   94 %   Exercise Oxygen Saturation  during 6 min walk  94 %   Max Ex. HR  117 bpm   Max Ex. BP  142/72   2 Minute Post BP  120/70          Oxygen Initial Assessment:   Oxygen Re-Evaluation:   Oxygen Discharge (Final Oxygen Re-Evaluation):   Initial Exercise Prescription: Initial Exercise Prescription - 07/12/18 1000    Date of Initial Exercise RX and Referring Provider          Date  07/12/18    Referring Provider  Dr. Gwenlyn Found    Expected Discharge Date  10/21/18        NuStep          Level  2    SPM  75    Minutes  10    METs  1.8        Arm Ergometer          Level  1    Watts  15    Minutes  10    METs  1.93        Track          Laps  6    Minutes  10    METs  2        Prescription Details          Frequency (times per week)  3    Duration  Progress to 30 minutes of continuous aerobic without signs/symptoms of physical distress        Intensity          THRR 40-80% of Max Heartrate  57-114    Ratings of Perceived Exertion  11-13        Progression          Progression  Continue to progress workloads to maintain intensity without signs/symptoms of physical distress.        Art gallery manager  Prescription  Yes    Weight  2 lbs.     Reps  10-15           Perform Capillary Blood Glucose checks as needed.  Exercise Prescription Changes:   Exercise Comments:   Exercise Goals and Review: Exercise Goals    Exercise Goals    Row Name 07/12/18 1041   Increase Physical Activity  Yes   Intervention  Provide advice,  education, support and counseling about physical activity/exercise needs.;Develop an individualized exercise prescription for aerobic and resistive training based on initial evaluation findings, risk stratification, comorbidities and participant's personal goals.   Expected Outcomes  Short Term: Attend rehab on a regular basis to increase amount of physical activity.   Increase Strength and Stamina  Yes   Intervention  Provide advice, education, support and counseling about physical activity/exercise needs.;Develop an individualized exercise prescription for aerobic and resistive training based on initial evaluation findings, risk stratification, comorbidities and participant's personal goals.   Expected Outcomes  Short Term: Increase workloads from initial exercise prescription for resistance, speed, and METs.   Able to understand and use rate of perceived exertion (RPE) scale  Yes   Intervention  Provide education and explanation on how to use RPE scale   Expected Outcomes  Short Term: Able to use RPE daily in rehab to express subjective intensity level;Long Term:  Able to use RPE to guide intensity level when exercising independently   Knowledge and understanding of Target Heart Rate Range (THRR)  Yes   Intervention  Provide education and explanation of THRR including how the numbers were predicted and where they are located for reference   Expected Outcomes  Short Term: Able to state/look up THRR;Long Term: Able to use THRR to govern intensity when exercising independently;Short Term: Able to use daily as guideline for intensity in rehab   Able to check pulse independently  Yes   Intervention  Provide education and demonstration on how to check pulse in carotid and radial arteries.;Review the importance of being able to check your own pulse for safety during independent exercise   Expected Outcomes  Short Term: Able to explain why pulse checking is important during independent exercise;Long Term:  Able to check pulse independently and accurately   Understanding of Exercise Prescription  Yes   Intervention  Provide education, explanation, and written materials on patient's individual exercise prescription   Expected Outcomes  Short Term: Able to explain program exercise prescription;Long Term: Able to explain home exercise prescription to exercise independently          Exercise Goals Re-Evaluation :   Discharge Exercise Prescription (Final Exercise Prescription Changes):   Nutrition:  Target Goals: Understanding of nutrition guidelines, daily intake of sodium 1500mg , cholesterol 200mg , calories 30% from fat and 7% or less from saturated fats, daily to have 5 or more servings of fruits and vegetables.  Biometrics: Pre Biometrics - 07/12/18 1041    Pre Biometrics          Height  5\' 5"  (1.651 m)    Weight  86.1 kg    Waist Circumference  40 inches    Hip Circumference  43 inches    Waist to Hip Ratio  0.93 %    BMI (Calculated)  31.59    Triceps Skinfold  40 mm    % Body Fat  45.3 %    Grip Strength  18 kg    Flexibility  0 in    Single Leg Stand  2.97 seconds  Nutrition Therapy Plan and Nutrition Goals: Nutrition Therapy & Goals - 07/12/18 1026    Nutrition Therapy          Diet  heart healthy, diabetic        Personal Nutrition Goals          Nutrition Goal  Pt to identify and limit food sources of saturated fat, trans fat, refined carbohydrates and sodium    Personal Goal #2  Pt able to name foods that affect blood glucose.    Personal Goal #3  Pt to explore batch cooking, meal prepping, and new recipes     Personal Goal #4  Pt to identify food quantities necessary to achieve weight loss of 6-15 lbs. at graduation from cardiac rehab.        Intervention Plan          Intervention  Prescribe, educate and counsel regarding individualized specific dietary modifications aiming towards targeted core components such as weight, hypertension, lipid  management, diabetes, heart failure and other comorbidities.    Expected Outcomes  Short Term Goal: Understand basic principles of dietary content, such as calories, fat, sodium, cholesterol and nutrients.;Long Term Goal: Adherence to prescribed nutrition plan.           Nutrition Assessments: Nutrition Assessments - 07/12/18 1027    MEDFICTS Scores          Pre Score  13           Nutrition Goals Re-Evaluation:   Nutrition Goals Re-Evaluation:   Nutrition Goals Discharge (Final Nutrition Goals Re-Evaluation):   Psychosocial: Target Goals: Acknowledge presence or absence of significant depression and/or stress, maximize coping skills, provide positive support system. Participant is able to verbalize types and ability to use techniques and skills needed for reducing stress and depression.  Initial Review & Psychosocial Screening: Initial Psych Review & Screening - 07/12/18 1158    Initial Review          Current issues with  Current Depression        Family Dynamics          Good Support System?  Yes   family and hospice friends   Concerns  Recent loss of significant other    Comments  husband passed away         Barriers          Psychosocial barriers to participate in program  The patient should benefit from training in stress management and relaxation.        Screening Interventions          Interventions  Encouraged to exercise;To provide support and resources with identified psychosocial needs;Provide feedback about the scores to participant    Expected Outcomes  Short Term goal: Utilizing psychosocial counselor, staff and physician to assist with identification of specific Stressors or current issues interfering with healing process. Setting desired goal for each stressor or current issue identified.;Long Term Goal: Stressors or current issues are controlled or eliminated.;Short Term goal: Identification and review with participant of any Quality of Life or  Depression concerns found by scoring the questionnaire.;Long Term goal: The participant improves quality of Life and PHQ9 Scores as seen by post scores and/or verbalization of changes           Quality of Life Scores: Quality of Life - 07/12/18 1052    Quality of Life          Select  Quality of Life        Quality of  Life Scores          Health/Function Pre  10.18 %    Socioeconomic Pre  23.71 %    Psych/Spiritual Pre  12.29 %    Family Pre  22.5 %    GLOBAL Pre  15.14 %          Scores of 19 and below usually indicate a poorer quality of life in these areas.  A difference of  2-3 points is a clinically meaningful difference.  A difference of 2-3 points in the total score of the Quality of Life Index has been associated with significant improvement in overall quality of life, self-image, physical symptoms, and general health in studies assessing change in quality of life.  PHQ-9: Recent Review Flowsheet Data    There is no flowsheet data to display.     Interpretation of Total Score  Total Score Depression Severity:  1-4 = Minimal depression, 5-9 = Mild depression, 10-14 = Moderate depression, 15-19 = Moderately severe depression, 20-27 = Severe depression   Psychosocial Evaluation and Intervention:   Psychosocial Re-Evaluation:   Psychosocial Discharge (Final Psychosocial Re-Evaluation):   Vocational Rehabilitation: Provide vocational rehab assistance to qualifying candidates.   Vocational Rehab Evaluation & Intervention: Vocational Rehab - 07/12/18 1206    Initial Vocational Rehab Evaluation & Intervention          Assessment shows need for Vocational Rehabilitation  No           Education: Education Goals: Education classes will be provided on a weekly basis, covering required topics. Participant will state understanding/return demonstration of topics presented.  Learning Barriers/Preferences: Learning Barriers/Preferences - 07/12/18 1043    Learning  Barriers/Preferences          Learning Barriers  Sight    Learning Preferences  Skilled Demonstration;Pictoral;Video           Education Topics: Count Your Pulse:  -Group instruction provided by verbal instruction, demonstration, patient participation and written materials to support subject.  Instructors address importance of being able to find your pulse and how to count your pulse when at home without a heart monitor.  Patients get hands on experience counting their pulse with staff help and individually.   Heart Attack, Angina, and Risk Factor Modification:  -Group instruction provided by verbal instruction, video, and written materials to support subject.  Instructors address signs and symptoms of angina and heart attacks.    Also discuss risk factors for heart disease and how to make changes to improve heart health risk factors.   Functional Fitness:  -Group instruction provided by verbal instruction, demonstration, patient participation, and written materials to support subject.  Instructors address safety measures for doing things around the house.  Discuss how to get up and down off the floor, how to pick things up properly, how to safely get out of a chair without assistance, and balance training.   Meditation and Mindfulness:  -Group instruction provided by verbal instruction, patient participation, and written materials to support subject.  Instructor addresses importance of mindfulness and meditation practice to help reduce stress and improve awareness.  Instructor also leads participants through a meditation exercise.    Stretching for Flexibility and Mobility:  -Group instruction provided by verbal instruction, patient participation, and written materials to support subject.  Instructors lead participants through series of stretches that are designed to increase flexibility thus improving mobility.  These stretches are additional exercise for major muscle groups that are  typically performed during regular warm up and  cool down.   Hands Only CPR:  -Group verbal, video, and participation provides a basic overview of AHA guidelines for community CPR. Role-play of emergencies allow participants the opportunity to practice calling for help and chest compression technique with discussion of AED use.   Hypertension: -Group verbal and written instruction that provides a basic overview of hypertension including the most recent diagnostic guidelines, risk factor reduction with self-care instructions and medication management.    Nutrition I class: Heart Healthy Eating:  -Group instruction provided by PowerPoint slides, verbal discussion, and written materials to support subject matter. The instructor gives an explanation and review of the Therapeutic Lifestyle Changes diet recommendations, which includes a discussion on lipid goals, dietary fat, sodium, fiber, plant stanol/sterol esters, sugar, and the components of a well-balanced, healthy diet.   Nutrition II class: Lifestyle Skills:  -Group instruction provided by PowerPoint slides, verbal discussion, and written materials to support subject matter. The instructor gives an explanation and review of label reading, grocery shopping for heart health, heart healthy recipe modifications, and ways to make healthier choices when eating out.   Diabetes Question & Answer:  -Group instruction provided by PowerPoint slides, verbal discussion, and written materials to support subject matter. The instructor gives an explanation and review of diabetes co-morbidities, pre- and post-prandial blood glucose goals, pre-exercise blood glucose goals, signs, symptoms, and treatment of hypoglycemia and hyperglycemia, and foot care basics.   Diabetes Blitz:  -Group instruction provided by PowerPoint slides, verbal discussion, and written materials to support subject matter. The instructor gives an explanation and review of the physiology  behind type 1 and type 2 diabetes, diabetes medications and rational behind using different medications, pre- and post-prandial blood glucose recommendations and Hemoglobin A1c goals, diabetes diet, and exercise including blood glucose guidelines for exercising safely.    Portion Distortion:  -Group instruction provided by PowerPoint slides, verbal discussion, written materials, and food models to support subject matter. The instructor gives an explanation of serving size versus portion size, changes in portions sizes over the last 20 years, and what consists of a serving from each food group.   Stress Management:  -Group instruction provided by verbal instruction, video, and written materials to support subject matter.  Instructors review role of stress in heart disease and how to cope with stress positively.     Exercising on Your Own:  -Group instruction provided by verbal instruction, power point, and written materials to support subject.  Instructors discuss benefits of exercise, components of exercise, frequency and intensity of exercise, and end points for exercise.  Also discuss use of nitroglycerin and activating EMS.  Review options of places to exercise outside of rehab.  Review guidelines for sex with heart disease.   Cardiac Drugs I:  -Group instruction provided by verbal instruction and written materials to support subject.  Instructor reviews cardiac drug classes: antiplatelets, anticoagulants, beta blockers, and statins.  Instructor discusses reasons, side effects, and lifestyle considerations for each drug class.   Cardiac Drugs II:  -Group instruction provided by verbal instruction and written materials to support subject.  Instructor reviews cardiac drug classes: angiotensin converting enzyme inhibitors (ACE-I), angiotensin II receptor blockers (ARBs), nitrates, and calcium channel blockers.  Instructor discusses reasons, side effects, and lifestyle considerations for each drug  class.   Anatomy and Physiology of the Circulatory System:  Group verbal and written instruction and models provide basic cardiac anatomy and physiology, with the coronary electrical and arterial systems. Review of: AMI, Angina, Valve disease, Heart Failure, Peripheral  Artery Disease, Cardiac Arrhythmia, Pacemakers, and the ICD.   Other Education:  -Group or individual verbal, written, or video instructions that support the educational goals of the cardiac rehab program.   Holiday Eating Survival Tips:  -Group instruction provided by PowerPoint slides, verbal discussion, and written materials to support subject matter. The instructor gives patients tips, tricks, and techniques to help them not only survive but enjoy the holidays despite the onslaught of food that accompanies the holidays.   Knowledge Questionnaire Score: Knowledge Questionnaire Score - 07/12/18 0907    Knowledge Questionnaire Score          Pre Score  17/24           Core Components/Risk Factors/Patient Goals at Admission: Personal Goals and Risk Factors at Admission - 07/12/18 1045    Core Components/Risk Factors/Patient Goals on Admission           Weight Management  Yes;Obesity;Weight Maintenance;Weight Loss    Intervention  Weight Management: Develop a combined nutrition and exercise program designed to reach desired caloric intake, while maintaining appropriate intake of nutrient and fiber, sodium and fats, and appropriate energy expenditure required for the weight goal.;Weight Management: Provide education and appropriate resources to help participant work on and attain dietary goals.;Weight Management/Obesity: Establish reasonable short term and long term weight goals.;Obesity: Provide education and appropriate resources to help participant work on and attain dietary goals.    Admit Weight  189 lb 13.1 oz (86.1 kg)    Expected Outcomes  Short Term: Continue to assess and modify interventions until short term  weight is achieved;Long Term: Adherence to nutrition and physical activity/exercise program aimed toward attainment of established weight goal;Weight Maintenance: Understanding of the daily nutrition guidelines, which includes 25-35% calories from fat, 7% or less cal from saturated fats, less than 200mg  cholesterol, less than 1.5gm of sodium, & 5 or more servings of fruits and vegetables daily;Weight Loss: Understanding of general recommendations for a balanced deficit meal plan, which promotes 1-2 lb weight loss per week and includes a negative energy balance of [phone] kcal/d;Understanding recommendations for meals to include 15-35% energy as protein, 25-35% energy from fat, 35-60% energy from carbohydrates, less than 200mg  of dietary cholesterol, 20-35 gm of total fiber daily;Understanding of distribution of calorie intake throughout the day with the consumption of 4-5 meals/snacks    Hypertension  Yes    Intervention  Provide education on lifestyle modifcations including regular physical activity/exercise, weight management, moderate sodium restriction and increased consumption of fresh fruit, vegetables, and low fat dairy, alcohol moderation, and smoking cessation.;Monitor prescription use compliance.    Expected Outcomes  Long Term: Maintenance of blood pressure at goal levels.;Short Term: Continued assessment and intervention until BP is < 140/3290mm HG in hypertensive participants. < 130/4980mm HG in hypertensive participants with diabetes, heart failure or chronic kidney disease.    Lipids  Yes    Intervention  Provide education and support for participant on nutrition & aerobic/resistive exercise along with prescribed medications to achieve LDL 70mg , HDL >40mg .    Expected Outcomes  Short Term: Participant states understanding of desired cholesterol values and is compliant with medications prescribed. Participant is following exercise prescription and nutrition guidelines.;Long Term: Cholesterol  controlled with medications as prescribed, with individualized exercise RX and with personalized nutrition plan. Value goals: LDL < 70mg , HDL > 40 mg.    Stress  Yes    Intervention  Offer individual and/or small group education and counseling on adjustment to heart disease, stress management and health-related  lifestyle change. Teach and support self-help strategies.;Refer participants experiencing significant psychosocial distress to appropriate mental health specialists for further evaluation and treatment. When possible, include family members and significant others in education/counseling sessions.    Expected Outcomes  Short Term: Participant demonstrates changes in health-related behavior, relaxation and other stress management skills, ability to obtain effective social support, and compliance with psychotropic medications if prescribed.;Long Term: Emotional wellbeing is indicated by absence of clinically significant psychosocial distress or social isolation.           Core Components/Risk Factors/Patient Goals Review:    Core Components/Risk Factors/Patient Goals at Discharge (Final Review):    ITP Comments: ITP Comments    Row Name 07/12/18 0843   ITP Comments  Dr. Armanda Magic, Medical Director       Comments: Patient attended orientation from [phone]  to [phone]  to review rules and guidelines for program. Completed 6 minute walk test, Intitial ITP, and exercise prescription.  VSS. Telemetry-sinus rhythm, non specific ST changes.  Asymptomatic.  Deveron Furlong, RN, BSN Cardiac Pulmonary Rehab 07/12/18 12:10 PM

## 2018-07-12 NOTE — Progress Notes (Signed)
Monique [name] 77 y.o. female DOB: 02/01/1941 MRN: [id number]      Nutrition Note  1. 05/30/2018 Stented coronary artery    Past Medical History:  Diagnosis Date  . Anemia     mild as per PCP  . Arthritis    hip/ s/p recent shoulder fracture 8/12- RIGHT  . Asthma   . Coronary artery disease 05/30/2018   LAD PCI/DES  . GI bleeding   . Hiatal hernia   . Humerus fracture    with wrist on right side  . Hypertension    hypercholesterolemia/  EKG on chart with clearance and note 06/18/11  Mazzocchi  . Pneumonia    Meds reviewed.    Current Outpatient Medications (Cardiovascular):  .  atorvastatin (LIPITOR) 80 MG tablet, Take 1 tablet (80 mg total) by mouth daily at 6 PM. .  carvedilol (COREG) 3.125 MG tablet, Take 1 tablet (3.125 mg total) by mouth 2 (two) times daily with a meal. .  isosorbide mononitrate (IMDUR) 30 MG 24 hr tablet, Take 0.5 tablets (15 mg total) by mouth daily. Marland Kitchen.  lisinopril (PRINIVIL,ZESTRIL) 40 MG tablet, Take 40 mg by mouth daily. .  nitroGLYCERIN (NITROSTAT) 0.4 MG SL tablet, Place 1 tablet (0.4 mg total) under the tongue every 5 (five) minutes as needed.  Current Outpatient Medications (Respiratory):  .  cetirizine (ZYRTEC) 10 MG tablet, Take 10 mg by mouth at bedtime.   Current Outpatient Medications (Analgesics):  .  aspirin EC 81 MG tablet, Take 81 mg by mouth daily. Marland Kitchen.  oxyCODONE-acetaminophen (PERCOCET) 7.5-325 MG tablet, Take 1 tablet by mouth every 4 (four) hours.   Current Outpatient Medications (Hematological):  .  clopidogrel (PLAVIX) 75 MG tablet, Take 1 tablet (75 mg total) by mouth daily with breakfast. .  ferrous sulfate (FERROUSUL) 325 (65 FE) MG tablet, Take 1 tablet (325 mg total) by mouth 2 (two) times daily with a meal.  Current Outpatient Medications (Other):  Marland Kitchen.  Cholecalciferol (VITAMIN D PO), Take 1 tablet by mouth daily. .  magnesium oxide (MAG-OX) 400 MG tablet, Take 400 mg by mouth daily. .  pantoprazole (PROTONIX) 40 MG  tablet, Take 1 tablet (40 mg total) by mouth 2 (two) times daily before a meal. .  polyethylene glycol (MIRALAX / GLYCOLAX) packet, Take 17 g by mouth 2 (two) times daily. (Patient taking differently: Take 17 g by mouth at bedtime. ) .  venlafaxine XR (EFFEXOR-XR) 75 MG 24 hr capsule, Take 225 mg by mouth at bedtime.    HT: Ht Readings from Last 1 Encounters:  06/08/18 5\' 5"  (1.651 m)    WT: Wt Readings from Last 5 Encounters:  06/08/18 192 lb 3.2 oz (87.2 kg)  06/01/18 189 lb 9.5 oz (86 kg)  05/27/18 190 lb (86.2 kg)  08/20/17 184 lb 9.6 oz (83.7 kg)  08/27/16 179 lb (81.2 kg)     BMI 31.98 (06/08/18)  Current tobacco use? No  Labs:  Lipid Panel     Component Value Date/Time   CHOL 135 06/01/2018 0345   TRIG 150 (H) 06/01/2018 0345   HDL 49 06/01/2018 0345   CHOLHDL 2.8 06/01/2018 0345   VLDL 30 06/01/2018 0345   LDLCALC 56 06/01/2018 0345    Lab Results  Component Value Date   HGBA1C 5.9 (H) 08/24/2016   CBG (last 3)  No results for input(s): GLUCAP in the last 72 hours.  Nutrition Note Spoke with pt. Nutrition plan and goals reviewed with pt. Pt is following Step 2  of the Therapeutic Lifestyle Changes diet. Pt wants to lose wt. Pt has been trying to lose wt by changing portion size, decreasing number of meals eaten out, moderating her intake of sweets. Additional heart healthy weight loss tips reviewed (label reading, how to build a healthy plate, portion sizes, eating frequently across the day). Pt with possible prediabetes, last A1c indicates blood glucose elevated at 5.9 (08/24/16). Pt also had elevated Triglycerides in her lab results from 06/01/2018. This Clinical research associate reviewed the difference between simple and complex carbohydrates. Recommended pt replace any simple carbohydrates with complex carbs to help manage blood lipids and blood glucose levels. Per discussion, pt does not use canned/convenience foods often. Pt does not add salt to food. Pt does not eat out frequently.  To help with weight loss set goal with patient to explore meal prepping, batch cooking, and heart healthy recipes. Pt expressed understanding of the information reviewed. Pt aware of nutrition education classes offered and would like to attend nutrition classes.  Nutrition Diagnosis ? Food-and nutrition-related knowledge deficit related to lack of exposure to information as related to diagnosis of: ? CVD ? Pre-diabetes ? Obese  I = 30-34.9 related to excessive energy intake as evidenced by a bmi 31.98  Nutrition Intervention ? Pt's individual nutrition plan and goals reviewed with pt. ? Pt given handouts for: ? Nutrition I class ? Nutrition II class   pre-diabetes  Nutrition Goal(s):  ? Pt to identify and limit food sources of saturated fat, trans fat, refined carbohydrates and sodium ? Pt to identify food quantities necessary to achieve weight loss of 6-15 lbs. at graduation from cardiac rehab. Goal wt loss of 10-15 lb desired.  ? Pt able to name foods that affect blood glucose. ? Pt to explore batch cooking, meal prepping, and new recipes    Plan:  ? Pt to attend nutrition classes ? Nutrition I ? Nutrition II ? Portion Distortion ? Diabetes Blitz ? Diabetes Q & Ae determined ? Will provide client-centered nutrition education as part of interdisciplinary care ? Monitor and evaluate progress toward nutrition goal with team.   Ross Marcus, MS, RD, LDN 07/12/2018 10:18 AM

## 2018-07-13 ENCOUNTER — Ambulatory Visit (INDEPENDENT_AMBULATORY_CARE_PROVIDER_SITE_OTHER): Payer: Medicare Other | Admitting: Cardiovascular Disease

## 2018-07-13 ENCOUNTER — Encounter: Payer: Self-pay | Admitting: Cardiovascular Disease

## 2018-07-13 DIAGNOSIS — I2510 Atherosclerotic heart disease of native coronary artery without angina pectoris: Secondary | ICD-10-CM | POA: Diagnosis not present

## 2018-07-13 DIAGNOSIS — I10 Essential (primary) hypertension: Secondary | ICD-10-CM | POA: Diagnosis not present

## 2018-07-13 DIAGNOSIS — Z9861 Coronary angioplasty status: Secondary | ICD-10-CM | POA: Diagnosis not present

## 2018-07-13 DIAGNOSIS — E785 Hyperlipidemia, unspecified: Secondary | ICD-10-CM | POA: Diagnosis not present

## 2018-07-13 DIAGNOSIS — I208 Other forms of angina pectoris: Secondary | ICD-10-CM

## 2018-07-13 NOTE — Assessment & Plan Note (Signed)
History of CAD status post LAD stenting by myself 05/22/2018 with a 2.5 mm x 20 mm long Synergy drug-eluting stent postdilated with a 2.5 balloon.  There was 1 small area that was not optimally expanded and she was she was left with a residual 40 to 50% fairly focal stenosis.  She also had a 80% small ramus branch stenosis.  However, since the procedure her angina and dyspnea have markedly improved.  She is on dual antiplatelet therapy.

## 2018-07-13 NOTE — Assessment & Plan Note (Signed)
History of essential hypertension her blood pressure measured today 132/69.  She is on carvedilol and lisinopril.  Continue current meds at current dosing.

## 2018-07-13 NOTE — Progress Notes (Signed)
07/13/2018 Monique [name]   07/11/1941  [id number]  Primary Physician Elizabeth PalauAnderson, Teresa, FNP Primary Cardiologist: Runell GessJonathan J Anabeth Chilcott MD Nicholes CalamityFACP, FACC, FAHA, [place]  HPI:  Monique [name] is a 77 y.o.  moderately overweight widowed Caucasian female mother of 2, grandmother and 2 grandchildren's husband Monique [name] was a long-term patient of mine who died 08/07/16. 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 She was referred by Elizabeth Palaueresa Anderson nurse practitioner for a symptomatic sinus tachycardia.  I last saw her in the office 05/27/2018. Her cardiac risk factors include treated hypertension and hyperlipidemia patient is a family history of heart disease with father who died of a myocardial function and age 78. She has never had a heart attack or stroke. She denies chest pain or shortness of breath. She does exercise and does yoga as well. She has reactive airways disease. She is noted to be tachycardic by her PCP was referred here for further evaluation.  I saw her back on 05/27/2018 she was complaining of increasing dyspnea and exertional chest pain.  I performed radial diagnostic cath 05/22/2018 revealing high-grade calcified proximal LAD stenosis.  I stented her with a 2.5 mm x 20 mm long Synergy drug-eluting stent however there was a small area that would not completely expand which left her with a fairly focal 50% stenosis within the dilated segment.  She also had an 80% small ramus branch stenosis which was untreated.  Her angina has resolved her dyspnea has improved.  She was significantly anemic with a hemoglobin of 8.1 during her hospitalization which is improved with iron repletion up to the low 9 range.    Current Meds  Medication Sig  . aspirin EC 81 MG tablet Take 81 mg by mouth daily.  Marland Kitchen. atorvastatin (LIPITOR) 80 MG tablet Take 1 tablet (80 mg total) by mouth daily at 6 PM.  . carvedilol (COREG) 3.125 MG tablet Take 1 tablet (3.125 mg total) by mouth 2 (two) times daily  with a meal.  . cetirizine (ZYRTEC) 10 MG tablet Take 10 mg by mouth at bedtime.   . Cholecalciferol (VITAMIN D PO) Take 1 tablet by mouth daily.  . clopidogrel (PLAVIX) 75 MG tablet Take 1 tablet (75 mg total) by mouth daily with breakfast.  . ferrous sulfate (FERROUSUL) 325 (65 FE) MG tablet Take 1 tablet (325 mg total) by mouth 2 (two) times daily with a meal.  . isosorbide mononitrate (IMDUR) 30 MG 24 hr tablet Take 0.5 tablets (15 mg total) by mouth daily.  Marland Kitchen. lisinopril (PRINIVIL,ZESTRIL) 40 MG tablet Take 40 mg by mouth daily.  . magnesium oxide (MAG-OX) 400 MG tablet Take 400 mg by mouth daily.  . nitroGLYCERIN (NITROSTAT) 0.4 MG SL tablet Place 1 tablet (0.4 mg total) under the tongue every 5 (five) minutes as needed.  Marland Kitchen. oxyCODONE-acetaminophen (PERCOCET) 7.5-325 MG tablet Take 1 tablet by mouth every 4 (four) hours.   . pantoprazole (PROTONIX) 40 MG tablet Take 1 tablet (40 mg total) by mouth 2 (two) times daily before a meal.  . polyethylene glycol (MIRALAX / GLYCOLAX) packet Take 17 g by mouth 2 (two) times daily. (Patient taking differently: Take 17 g by mouth at bedtime. )  . venlafaxine XR (EFFEXOR-XR) 75 MG 24 hr capsule Take 225 mg by mouth at bedtime.      Allergies  Allergen Reactions  . Morphine And Related Anxiety  . Pregabalin     anxiety    Social History   Socioeconomic History  .  Marital status: Widowed    Spouse name: Not on file  . Number of children: Not on file  . Years of education: Not on file  . Highest education level: Not on file  Occupational History  . Not on file  Social Needs  . Financial resource strain: Not on file  . Food insecurity:    Worry: Not on file    Inability: Not on file  . Transportation needs:    Medical: Not on file    Non-medical: Not on file  Tobacco Use  . Smoking status: Never Smoker  . Smokeless tobacco: Never Used  Substance and Sexual Activity  . Alcohol use: Yes    Comment: socially- 2 x year  . Drug use: No    . Sexual activity: Not on file  Lifestyle  . Physical activity:    Days per week: Not on file    Minutes per session: Not on file  . Stress: Not on file  Relationships  . Social connections:    Talks on phone: Not on file    Gets together: Not on file    Attends religious service: Not on file    Active member of club or organization: Not on file    Attends meetings of clubs or organizations: Not on file    Relationship status: Not on file  . Intimate partner violence:    Fear of current or ex partner: Not on file    Emotionally abused: Not on file    Physically abused: Not on file    Forced sexual activity: Not on file  Other Topics Concern  . Not on file  Social History Narrative  . Not on file     Review of Systems: General: negative for chills, fever, night sweats or weight changes.  Cardiovascular: negative for chest pain, dyspnea on exertion, edema, orthopnea, palpitations, paroxysmal nocturnal dyspnea or shortness of breath Dermatological: negative for rash Respiratory: negative for cough or wheezing Urologic: negative for hematuria Abdominal: negative for nausea, vomiting, diarrhea, bright red blood per rectum, melena, or hematemesis Neurologic: negative for visual changes, syncope, or dizziness All other systems reviewed and are otherwise negative except as noted above.    Blood pressure 132/69, pulse 89, height 5' 5.5" (1.664 m), weight 188 lb (85.3 kg).  General appearance: alert and no distress Neck: no adenopathy, no carotid bruit, no JVD, supple, symmetrical, trachea midline and thyroid not enlarged, symmetric, no tenderness/mass/nodules Lungs: clear to auscultation bilaterally Heart: regular rate and rhythm, S1, S2 normal, no murmur, click, rub or gallop Extremities: extremities normal, atraumatic, no cyanosis or edema Pulses: 2+ and symmetric Skin: Skin color, texture, turgor normal. No rashes or lesions Neurologic: Alert and oriented X 3, normal strength  and tone. Normal symmetric reflexes. Normal coordination and gait  EKG not performed today  ASSESSMENT AND PLAN:   Essential hypertension History of essential hypertension her blood pressure measured today 132/69.  She is on carvedilol and lisinopril.  Continue current meds at current dosing.  Dyslipidemia, goal LDL below 70 History of dyslipidemia on statin therapy with recent lipid profile performed 06/01/2018 revealing total cholesterol 135, LDL 56 and HDL of 49.  CAD S/P percutaneous coronary angioplasty History of CAD status post LAD stenting by myself 05/22/2018 with a 2.5 mm x 20 mm long Synergy drug-eluting stent postdilated with a 2.5 balloon.  There was 1 small area that was not optimally expanded and she was she was left with a residual 40 to 50% fairly focal  stenosis.  She also had a 80% small ramus branch stenosis.  However, since the procedure her angina and dyspnea have markedly improved.  She is on dual antiplatelet therapy.      Runell Gess MD FACP,FACC,FAHA, [facility] 07/13/2018 4:14 PM

## 2018-07-13 NOTE — Patient Instructions (Signed)
Medication Instructions:  NO CHANGE If you need a refill on your cardiac medications before your next appointment, please call your pharmacy.   Lab work: NONE If you have labs (blood work) drawn today and your tests are completely normal, you will receive your results only by: Marland Kitchen. MyChart Message (if you have MyChart) OR . A paper copy in the mail If you have any lab test that is abnormal or we need to change your treatment, we will call you to review the results.  Testing/Procedures: NONE  Follow-Up: At [facility], you and your health needs are our priority.  As part of our continuing mission to provide you with exceptional heart care, we have created designated Provider Care Teams.  These Care Teams include your primary Cardiologist (physician) and Advanced Practice Providers (APPs -  Physician Assistants and Nurse Practitioners) who all work together to provide you with the care you need, when you need it. . You will need a follow up appointment in 6 months WITH LUKE KILROY PA AND 12 MONTHS WITH DR BERRY Please call our office 2 months in advance to schedule this appointment.

## 2018-07-13 NOTE — Assessment & Plan Note (Signed)
History of dyslipidemia on statin therapy with recent lipid profile performed 06/01/2018 revealing total cholesterol 135, LDL 56 and HDL of 49.

## 2018-07-18 ENCOUNTER — Encounter (HOSPITAL_COMMUNITY): Payer: Medicare Other

## 2018-07-20 ENCOUNTER — Encounter (HOSPITAL_COMMUNITY): Payer: Medicare Other

## 2018-07-22 ENCOUNTER — Encounter (HOSPITAL_COMMUNITY)
Admission: RE | Admit: 2018-07-22 | Discharge: 2018-07-22 | Disposition: A | Discharge status: Home / Self Care | Payer: Medicare Other | Source: Ambulatory Visit | Attending: Cardiovascular Disease | Admitting: Cardiovascular Disease

## 2018-07-22 ENCOUNTER — Encounter (HOSPITAL_COMMUNITY): Payer: Self-pay

## 2018-07-22 DIAGNOSIS — Z955 Presence of coronary angioplasty implant and graft: Secondary | ICD-10-CM

## 2018-07-22 NOTE — Progress Notes (Signed)
Daily Session Note  Patient Details  Name: Monique [name] MRN: [id number] Date of Birth: 1941/01/13 Referring Provider:   Flowsheet Row CARDIAC REHAB PHASE II ORIENTATION from 07/12/2018 in [place]  Referring Provider  Dr. Gwenlyn Found      Encounter Date: 07/22/2018  Check In: Session Check In - 07/22/18 1419    Check-In          Supervising physician immediately available to respond to emergencies  [facility] immediately available    Physician(s)  Dr. Karleen Hampshire    Location  [facility]    Staff Present  Seward Carol, MS, ACSM CEP, Exercise Physiologist;Maria Whitaker, RN, BSN;Joann Rion, RN, BSN    Medication changes reported      No    Fall or balance concerns reported     No    Tobacco Cessation  No Change    Warm-up and Cool-down  Performed as group-led Higher education careers adviser Performed  Yes    VAD Patient?  No    PAD/SET Patient?  No        Pain Assessment          Currently in Pain?  No/denies           Capillary Blood Glucose: No results found for this or any previous visit (from the past 24 hour(s)).    Social History   Tobacco Use  Smoking Status Never Smoker  Smokeless Tobacco Never Used    Goals Met:  Exercise tolerated well  Goals Unmet:  Not Applicable  Comments: Pt started cardiac rehab today.  Pt tolerated light exercise without difficulty. VSS, telemetry-sinus rhythm, asymptomatic.  Medication list reconciled. Pt denies barriers to medicaiton compliance.  PSYCHOSOCIAL ASSESSMENT:  PHQ-10. Pt with known depression describes depressive symptoms unimproved with current medication treatment.  Pt has counselor who recommends pt contact prescribing MD for medication adjustment.  I have communicated with pt I agree with this plan. Pt declines offer to ctacct MD on her behalf. Pt states she has upcoming previously scheduled appointment soon. Pt denies suicidal ideation or plan.  Pt has very  supportive friends, family and peers in support groups.   Will continue to follow. Pt oriented to exercise equipment and routine.    Understanding verbalized. Andi Hence, RN, BSN Cardiac Pulmonary Rehab    Dr. Fransico Him is Medical Director for Cardiac Rehab at [facility].

## 2018-07-25 ENCOUNTER — Encounter (HOSPITAL_COMMUNITY)
Admission: RE | Admit: 2018-07-25 | Discharge: 2018-07-25 | Disposition: A | Discharge status: Home / Self Care | Payer: Medicare Other | Source: Ambulatory Visit | Attending: Cardiovascular Disease | Admitting: Cardiovascular Disease

## 2018-07-25 DIAGNOSIS — Z955 Presence of coronary angioplasty implant and graft: Secondary | ICD-10-CM

## 2018-07-29 ENCOUNTER — Encounter (HOSPITAL_COMMUNITY)
Admission: RE | Admit: 2018-07-29 | Discharge: 2018-07-29 | Disposition: A | Discharge status: Home / Self Care | Payer: Medicare Other | Source: Ambulatory Visit | Attending: Cardiovascular Disease | Admitting: Cardiovascular Disease

## 2018-07-29 DIAGNOSIS — Z955 Presence of coronary angioplasty implant and graft: Secondary | ICD-10-CM | POA: Diagnosis not present

## 2018-08-01 ENCOUNTER — Encounter (HOSPITAL_COMMUNITY)
Admission: RE | Admit: 2018-08-01 | Discharge: 2018-08-01 | Disposition: A | Discharge status: Home / Self Care | Payer: Medicare Other | Source: Ambulatory Visit | Attending: Cardiovascular Disease | Admitting: Cardiovascular Disease

## 2018-08-01 DIAGNOSIS — Z955 Presence of coronary angioplasty implant and graft: Secondary | ICD-10-CM

## 2018-08-05 ENCOUNTER — Encounter (HOSPITAL_COMMUNITY)
Admission: RE | Admit: 2018-08-05 | Discharge: 2018-08-05 | Disposition: A | Discharge status: Home / Self Care | Payer: Medicare Other | Source: Ambulatory Visit | Attending: Cardiovascular Disease | Admitting: Cardiovascular Disease

## 2018-08-05 DIAGNOSIS — I10 Essential (primary) hypertension: Secondary | ICD-10-CM | POA: Insufficient documentation

## 2018-08-05 DIAGNOSIS — R079 Chest pain, unspecified: Secondary | ICD-10-CM | POA: Insufficient documentation

## 2018-08-05 DIAGNOSIS — R9431 Abnormal electrocardiogram [ECG] [EKG]: Secondary | ICD-10-CM | POA: Diagnosis not present

## 2018-08-05 DIAGNOSIS — Z886 Allergy status to analgesic agent status: Secondary | ICD-10-CM | POA: Insufficient documentation

## 2018-08-05 DIAGNOSIS — I2510 Atherosclerotic heart disease of native coronary artery without angina pectoris: Secondary | ICD-10-CM | POA: Diagnosis not present

## 2018-08-05 DIAGNOSIS — Z888 Allergy status to other drugs, medicaments and biological substances status: Secondary | ICD-10-CM | POA: Insufficient documentation

## 2018-08-05 DIAGNOSIS — Z955 Presence of coronary angioplasty implant and graft: Secondary | ICD-10-CM | POA: Diagnosis not present

## 2018-08-05 DIAGNOSIS — E785 Hyperlipidemia, unspecified: Secondary | ICD-10-CM | POA: Insufficient documentation

## 2018-08-08 ENCOUNTER — Encounter (HOSPITAL_COMMUNITY)
Admission: RE | Admit: 2018-08-08 | Discharge: 2018-08-08 | Disposition: A | Discharge status: Home / Self Care | Payer: Medicare Other | Source: Ambulatory Visit | Attending: Cardiovascular Disease | Admitting: Cardiovascular Disease

## 2018-08-08 DIAGNOSIS — Z955 Presence of coronary angioplasty implant and graft: Secondary | ICD-10-CM

## 2018-08-08 NOTE — Progress Notes (Signed)
Monique [name] 78 y.o. female Nutrition Note Spoke with pt. Nutrition plan and goals reviewed with pt. Pt is following heart healthy diet. Pt wants to lose wt. Pt has been trying to lose wt by changing portion size, decreasing number of meals eaten out, and decreasing her intake of sweets. Additional heart healthy, pre-diabetic weight loss tips reviewed (label reading, how to build a healthy plate, portion sizes, eating frequently across the day). Pt with possible prediabetes, last A1C elevated at 5.9. Reviewed the difference between simple and complex carbohydrates, and recommended pt replace any simple carbohydrates with complex carbs to help manage blood sugar levels. Reminded pt of goal set at orientation to explore meal prepping, batch cooking, and heart healthy recipes. Pt shared she has been snacking at night, will also wake up and eat a snack in the evening. Discussed with patient that it sounds like an evening habit she has developed. Recommended pt replace late night snacking with a pleasurable non-food related activity, such as a warm cup of herbal tea. Pt was open to this idea. Pt expressed understanding of the information reviewed. Pt aware of nutrition education classes offered and would like to attend nutrition classes.  Lab Results  Component Value Date   HGBA1C 5.9 (H) 08/24/2016    Wt Readings from Last 3 Encounters:  07/13/18 188 lb (85.3 kg)  07/12/18 189 lb 13.1 oz (86.1 kg)  06/08/18 192 lb 3.2 oz (87.2 kg)    Nutrition Diagnosis  Food-and nutrition-related knowledge deficit related to lack of exposure to information as related to diagnosis of: ? CVD ? Pre-diabetes  Obese  I = 30-34.9 related to excessive energy intake as evidenced by a bmi 31.98  Nutrition Intervention ? Pt's individual nutrition plan reviewed with pt. ? Benefits of continuing to adopt a Heart Healthy diet discussed when Medficts reviewed.   Goal(s)  Pt to identify and limit food sources of  saturated fat, trans fat, refined carbohydrates and sodium  Pt to identify food quantities necessary to achieve weight loss of 6-15 lbs. at graduation from cardiac rehab. Goal wt loss of 10-15 lb desired.   Pt able to name foods that affect blood glucose.  Pt to explore batch cooking, meal prepping, and new recipes   Plan:   Pt to attend nutrition classes ? Nutrition I ? Nutrition II ? Portion Distortion   Will provide client-centered nutrition education as part of interdisciplinary care  Monitor and evaluate progress toward nutrition goal with team.    Ross Marcus, MS, RD, LDN 08/08/2018 2:31 PM

## 2018-08-10 ENCOUNTER — Encounter (HOSPITAL_COMMUNITY)
Admission: RE | Admit: 2018-08-10 | Discharge: 2018-08-10 | Disposition: A | Discharge status: Home / Self Care | Payer: Medicare Other | Source: Ambulatory Visit | Attending: Cardiovascular Disease | Admitting: Cardiovascular Disease

## 2018-08-10 ENCOUNTER — Encounter (HOSPITAL_COMMUNITY): Payer: Self-pay

## 2018-08-10 DIAGNOSIS — Z955 Presence of coronary angioplasty implant and graft: Secondary | ICD-10-CM | POA: Diagnosis not present

## 2018-08-10 NOTE — Progress Notes (Signed)
Cardiac Individual Treatment Plan  Patient Details  Name: Monique [name] MRN: [id number] Date of Birth: 1940/11/16 Referring Provider:   Flowsheet Row CARDIAC REHAB PHASE II ORIENTATION from 07/12/2018 in [place]  Referring Provider  Dr. Gwenlyn Found      Initial Encounter Date:  Flowsheet Row CARDIAC REHAB PHASE II ORIENTATION from 07/12/2018 in [place]  Date  07/12/18      Visit Diagnosis: 05/30/2018 Stented coronary artery  Patient's Home Medications on Admission:  Current Outpatient Medications:  .  aspirin EC 81 MG tablet, Take 81 mg by mouth daily., Disp: , Rfl:  .  atorvastatin (LIPITOR) 80 MG tablet, Take 1 tablet (80 mg total) by mouth daily at 6 PM., Disp: 90 tablet, Rfl: 2 .  carvedilol (COREG) 3.125 MG tablet, Take 1 tablet (3.125 mg total) by mouth 2 (two) times daily with a meal., Disp: 60 tablet, Rfl: 2 .  cetirizine (ZYRTEC) 10 MG tablet, Take 10 mg by mouth at bedtime. , Disp: , Rfl:  .  Cholecalciferol (VITAMIN D PO), Take 1 tablet by mouth daily., Disp: , Rfl:  .  clopidogrel (PLAVIX) 75 MG tablet, Take 1 tablet (75 mg total) by mouth daily with breakfast., Disp: 90 tablet, Rfl: 2 .  ferrous sulfate (FERROUSUL) 325 (65 FE) MG tablet, Take 1 tablet (325 mg total) by mouth 2 (two) times daily with a meal., Disp: 180 tablet, Rfl: 2 .  isosorbide mononitrate (IMDUR) 30 MG 24 hr tablet, Take 0.5 tablets (15 mg total) by mouth daily., Disp: 90 tablet, Rfl: 1 .  lisinopril (PRINIVIL,ZESTRIL) 40 MG tablet, Take 40 mg by mouth daily., Disp: , Rfl:  .  magnesium oxide (MAG-OX) 400 MG tablet, Take 400 mg by mouth daily., Disp: , Rfl:  .  nitroGLYCERIN (NITROSTAT) 0.4 MG SL tablet, Place 1 tablet (0.4 mg total) under the tongue every 5 (five) minutes as needed., Disp: 25 tablet, Rfl: 2 .  oxyCODONE-acetaminophen (PERCOCET) 7.5-325 MG tablet, Take 1 tablet by mouth every 4 (four) hours. , Disp: , Rfl:  .   pantoprazole (PROTONIX) 40 MG tablet, Take 1 tablet (40 mg total) by mouth 2 (two) times daily before a meal., Disp: 30 tablet, Rfl: 0 .  polyethylene glycol (MIRALAX / GLYCOLAX) packet, Take 17 g by mouth 2 (two) times daily. (Patient taking differently: Take 17 g by mouth at bedtime. ), Disp: 14 each, Rfl: 0 .  venlafaxine XR (EFFEXOR-XR) 75 MG 24 hr capsule, Take 225 mg by mouth at bedtime. , Disp: , Rfl: 0  Past Medical History: Past Medical History:  Diagnosis Date  . Anemia     mild as per PCP  . Arthritis    hip/ s/p recent shoulder fracture 8/12- RIGHT  . Asthma   . Coronary artery disease 05/30/2018   LAD PCI/DES  . GI bleeding   . Hiatal hernia   . Humerus fracture    with wrist on right side  . Hypertension    hypercholesterolemia/  EKG on chart with clearance and note 06/18/11  [facility]  . Pneumonia     Tobacco Use: Social History   Tobacco Use  Smoking Status Never Smoker  Smokeless Tobacco Never Used    Labs: Recent Review Flowsheet Data    Labs for ITP Cardiac and Pulmonary Rehab Latest Ref Rng & Units 08/24/2016 06/01/2018   Cholestrol 0 - 200 mg/dL - 135   LDLCALC 0 - 99 mg/dL - 56  HDL >40 mg/dL - 49   Trlycerides <150 mg/dL - 150(H)   Hemoglobin A1c 4.8 - 5.6 % 5.9(H) -      Capillary Blood Glucose: No results found for: GLUCAP   Exercise Target Goals: Exercise Program Goal: Individual exercise prescription set using results from initial 6 min walk test and THRR while considering  patient's activity barriers and safety.   Exercise Prescription Goal: Initial exercise prescription builds to 30-45 minutes a day of aerobic activity, 2-3 days per week.  Home exercise guidelines will be given to patient during program as part of exercise prescription that the participant will acknowledge.  Activity Barriers & Risk Stratification: Activity Barriers & Cardiac Risk Stratification - 07/12/18 1052    Activity Barriers & Cardiac Risk Stratification           Cardiac Risk Stratification  High           6 Minute Walk: 6 Minute Walk    6 Minute Walk    Row Name 07/12/18 1038   Phase  Initial   Distance  976 feet   Walk Time  6 minutes   # of Rest Breaks  1   MPH  1.9   METS  1.9   RPE  13   VO2 Peak  6.65   Symptoms  Yes (comment)   Comments  Knee and back pain   Resting HR  98 bpm   Resting BP  108/70   Resting Oxygen Saturation   94 %   Exercise Oxygen Saturation  during 6 min walk  94 %   Max Ex. HR  117 bpm   Max Ex. BP  142/72   2 Minute Post BP  120/70          Oxygen Initial Assessment:   Oxygen Re-Evaluation:   Oxygen Discharge (Final Oxygen Re-Evaluation):   Initial Exercise Prescription: Initial Exercise Prescription - 07/12/18 1000    Date of Initial Exercise RX and Referring Provider          Date  07/12/18    Referring Provider  Dr. Gwenlyn Found    Expected Discharge Date  10/21/18        NuStep          Level  2    SPM  75    Minutes  10    METs  1.8        Arm Ergometer          Level  1    Watts  15    Minutes  10    METs  1.93        Track          Laps  6    Minutes  10    METs  2        Prescription Details          Frequency (times per week)  3    Duration  Progress to 30 minutes of continuous aerobic without signs/symptoms of physical distress        Intensity          THRR 40-80% of Max Heartrate  57-114    Ratings of Perceived Exertion  11-13        Progression          Progression  Continue to progress workloads to maintain intensity without signs/symptoms of physical distress.        Art gallery manager  Prescription  Yes    Weight  2 lbs.     Reps  10-15           Perform Capillary Blood Glucose checks as needed.  Exercise Prescription Changes: Exercise Prescription Changes    Response to Exercise    Row Name 08/02/18 1500 08/05/18 1400   Blood Pressure (Admit)  132/70  128/84   Blood Pressure (Exercise)  138/78  152/70    Blood Pressure (Exit)  120/62  112/70   Heart Rate (Admit)  99 bpm  99 bpm   Heart Rate (Exercise)  119 bpm  116 bpm   Heart Rate (Exit)  97 bpm  87 bpm   Rating of Perceived Exertion (Exercise)  13  12   Duration  Progress to 30 minutes of  aerobic without signs/symptoms of physical distress  Progress to 30 minutes of  aerobic without signs/symptoms of physical distress   Intensity  THRR unchanged  THRR unchanged       Progression    Row Name 08/02/18 1500 08/05/18 1400   Progression  Continue to progress workloads to maintain intensity without signs/symptoms of physical distress.  Continue to progress workloads to maintain intensity without signs/symptoms of physical distress.   Average METs  2.1  1.9       Resistance Training    Row Name 08/02/18 1500 08/05/18 1400   Training Prescription  Yes  Yes   Weight  2 lbs.   3 lbs.   Reps  10-15  10-15   Time  10 Minutes  [place] Name 08/02/18 1500 08/05/18 1400   Level  3  3   SPM  75  75   Minutes  10  10   METs  1.8  1.8       Track    Row Name 08/02/18 1500 08/05/18 1400   Laps  10  8   Minutes  10  10   METs  2.76  2.05       Home Exercise Plan    Row Name 08/02/18 1500 08/05/18 1400   Plans to continue exercise at  no documentation  Home (comment)   Frequency  no documentation  Add 1 additional day to program exercise sessions.   Initial Home Exercises Provided  no documentation  08/05/18          Exercise Comments: Exercise Comments    Row Name 08/02/18 1533 08/05/18 1456   Exercise Comments  Reviewed METs and goals with Pt.   Reviewed HEP with Pt. Encourgaed Pt to exercise at home.       Exercise Goals and Review: Exercise Goals    Exercise Goals    Row Name 07/12/18 1041   Increase Physical Activity  Yes   Intervention  Provide advice, education, support and counseling about physical activity/exercise needs.;Develop an individualized exercise prescription for aerobic and resistive  training based on initial evaluation findings, risk stratification, comorbidities and participant's personal goals.   Expected Outcomes  Short Term: Attend rehab on a regular basis to increase amount of physical activity.   Increase Strength and Stamina  Yes   Intervention  Provide advice, education, support and counseling about physical activity/exercise needs.;Develop an individualized exercise prescription for aerobic and resistive training based on initial evaluation findings, risk stratification, comorbidities and participant's personal goals.   Expected Outcomes  Short Term: Increase workloads from initial exercise prescription for resistance, speed, and METs.  Able to understand and use rate of perceived exertion (RPE) scale  Yes   Intervention  Provide education and explanation on how to use RPE scale   Expected Outcomes  Short Term: Able to use RPE daily in rehab to express subjective intensity level;Long Term:  Able to use RPE to guide intensity level when exercising independently   Knowledge and understanding of Target Heart Rate Range (THRR)  Yes   Intervention  Provide education and explanation of THRR including how the numbers were predicted and where they are located for reference   Expected Outcomes  Short Term: Able to state/look up THRR;Long Term: Able to use THRR to govern intensity when exercising independently;Short Term: Able to use daily as guideline for intensity in rehab   Able to check pulse independently  Yes   Intervention  Provide education and demonstration on how to check pulse in carotid and radial arteries.;Review the importance of being able to check your own pulse for safety during independent exercise   Expected Outcomes  Short Term: Able to explain why pulse checking is important during independent exercise;Long Term: Able to check pulse independently and accurately   Understanding of Exercise Prescription  Yes   Intervention  Provide education, explanation, and  written materials on patient's individual exercise prescription   Expected Outcomes  Short Term: Able to explain program exercise prescription;Long Term: Able to explain home exercise prescription to exercise independently          Exercise Goals Re-Evaluation : Exercise Goals Re-Evaluation    Exercise Goal Re-Evaluation    Row Name 08/02/18 1532 08/05/18 1454   Exercise Goals Review  Increase Physical Activity;Increase Strength and Stamina;Able to check pulse independently;Understanding of Exercise Prescription;Knowledge and understanding of Target Heart Rate Range (THRR);Able to understand and use rate of perceived exertion (RPE) scale  Increase Physical Activity;Increase Strength and Stamina;Able to check pulse independently;Understanding of Exercise Prescription;Knowledge and understanding of Target Heart Rate Range (THRR);Able to understand and use rate of perceived exertion (RPE) scale   Comments  Reviewed METs and goals with Pt. MET level is 2.1. Pt has some pain with exercise due to arthritis and deconditioning.   Reviewed HEP with Pt. Pt is not currently exercising at home. Encouraged Pt to walk 2-4 days for 30 minutes in addition to cardiac rehab.    Expected Outcomes  Will continue to monitor and progress Pt as tolerated.   Will continue to monitor and progress Pt as tolerated.           Discharge Exercise Prescription (Final Exercise Prescription Changes): Exercise Prescription Changes - 08/05/18 1400    Response to Exercise          Blood Pressure (Admit)  128/84    Blood Pressure (Exercise)  152/70    Blood Pressure (Exit)  112/70    Heart Rate (Admit)  99 bpm    Heart Rate (Exercise)  116 bpm    Heart Rate (Exit)  87 bpm    Rating of Perceived Exertion (Exercise)  12    Duration  Progress to 30 minutes of  aerobic without signs/symptoms of physical distress    Intensity  THRR unchanged        Progression          Progression  Continue to progress workloads to  maintain intensity without signs/symptoms of physical distress.    Average METs  1.9        Resistance Training          Training  Prescription  Yes    Weight  3 lbs.    Reps  10-15    Time  10 Minutes        NuStep          Level  3    SPM  75    Minutes  10    METs  1.8        Track          Laps  8    Minutes  10    METs  2.05        Home Exercise Plan          Plans to continue exercise at  Home (comment)    Frequency  Add 1 additional day to program exercise sessions.    Initial Home Exercises Provided  08/05/18           Nutrition:  Target Goals: Understanding of nutrition guidelines, daily intake of sodium <1552m, cholesterol <2022m calories 30% from fat and 7% or less from saturated fats, daily to have 5 or more servings of fruits and vegetables.  Biometrics: Pre Biometrics - 07/12/18 1041    Pre Biometrics          Height  _0  (1.651 m)    Weight  86.1 kg    Waist Circumference  40 inches    Hip Circumference  43 inches    Waist to Hip Ratio  0.93 %    BMI (Calculated)  31.59    Triceps Skinfold  40 mm    % Body Fat  45.3 %    Grip Strength  18 kg    Flexibility  0 in    Single Leg Stand  2.97 seconds            Nutrition Therapy Plan and Nutrition Goals: Nutrition Therapy & Goals - 07/12/18 1026    Nutrition Therapy          Diet  heart healthy, diabetic        Personal Nutrition Goals          Nutrition Goal  Pt to identify and limit food sources of saturated fat, trans fat, refined carbohydrates and sodium    Personal Goal #2  Pt able to name foods that affect blood glucose.    Personal Goal #3  Pt to explore batch cooking, meal prepping, and new recipes     Personal Goal #4  Pt to identify food quantities necessary to achieve weight loss of 6-15 lbs. at graduation from cardiac rehab.        Intervention Plan          Intervention  Prescribe, educate and counsel regarding individualized specific dietary modifications aiming  towards targeted core components such as weight, hypertension, lipid management, diabetes, heart failure and other comorbidities.    Expected Outcomes  Short Term Goal: Understand basic principles of dietary content, such as calories, fat, sodium, cholesterol and nutrients.;Long Term Goal: Adherence to prescribed nutrition plan.           Nutrition Assessments: Nutrition Assessments - 07/12/18 1027    MEDFICTS Scores          Pre Score  13           Nutrition Goals Re-Evaluation:   Nutrition Goals Re-Evaluation:   Nutrition Goals Discharge (Final Nutrition Goals Re-Evaluation):   Psychosocial: Target Goals: Acknowledge presence or absence of significant depression and/or stress, maximize coping skills, provide positive support system. Participant is able  to verbalize types and ability to use techniques and skills needed for reducing stress and depression.  Initial Review & Psychosocial Screening: Initial Psych Review & Screening - 07/12/18 1158    Initial Review          Current issues with  Current Depression        Family Dynamics          Good Support System?  Yes   family and hospice friends   Concerns  Recent loss of significant other    Comments  husband passed away         Barriers          Psychosocial barriers to participate in program  The patient should benefit from training in stress management and relaxation.        Screening Interventions          Interventions  Encouraged to exercise;To provide support and resources with identified psychosocial needs;Provide feedback about the scores to participant    Expected Outcomes  Short Term goal: Utilizing psychosocial counselor, staff and physician to assist with identification of specific Stressors or current issues interfering with healing process. Setting desired goal for each stressor or current issue identified.;Long Term Goal: Stressors or current issues are controlled or eliminated.;Short Term goal:  Identification and review with participant of any Quality of Life or Depression concerns found by scoring the questionnaire.;Long Term goal: The participant improves quality of Life and PHQ9 Scores as seen by post scores and/or verbalization of changes           Quality of Life Scores: Quality of Life - 08/08/18 1411    Quality of Life Scores          Health/Function Pre  10.18 %   pt notes improvement in symptoms however still c/o DOE affecting home care ADLs like bedmaking and dishwashing.    Socioeconomic Pre  23.71 %    Psych/Spiritual Pre  12.29 %   pt meets with Hospice group and individual therapy with positive results.   Family Pre  22.5 %    GLOBAL Pre  15.14 %   overall pt still coping with multiple stressors including personal health, family and grief.  pt accepting of offer to meet with Jeanella Craze. LM for his assistant to call to schedule pt.          Scores of 19 and below usually indicate a poorer quality of life in these areas.  A difference of  2-3 points is a clinically meaningful difference.  A difference of 2-3 points in the total score of the Quality of Life Index has been associated with significant improvement in overall quality of life, self-image, physical symptoms, and general health in studies assessing change in quality of life.  PHQ-9: Recent Review Flowsheet Data    Depression screen [facility] 2/9 07/22/2018   Decreased Interest 2   Down, Depressed, Hopeless 2   PHQ - 2 Score 4   Altered sleeping 1   Tired, decreased energy 2   Change in appetite 0   Feeling bad or failure about yourself  2   Trouble concentrating 0   Moving slowly or fidgety/restless 1   Suicidal thoughts 0   PHQ-9 Score 10   Difficult doing work/chores Very difficult     Interpretation of Total Score  Total Score Depression Severity:  1-4 = Minimal depression, 5-9 = Mild depression, 10-14 = Moderate depression, 15-19 = Moderately severe depression, 20-27 = Severe depression  Psychosocial Evaluation and Intervention: Psychosocial Evaluation - 07/22/18 1424    Psychosocial Evaluation & Interventions          Interventions  Stress management education;Encouraged to exercise with the program and follow exercise prescription;Relaxation education;Physician referral    Comments  pt with known depression and grief pattern demonstrates health related stress and anxiety. pt reports "no joy or motivation" from daily activities. pt does have support systems and coping skills. pt has been advised to contact PCP for medication managements.      Expected Outcomes  pt will exhibit improved outlook and coping skills.     Continue Psychosocial Services   Follow up required by staff           Psychosocial Re-Evaluation: Psychosocial Re-Evaluation    Psychosocial Re-Evaluation    [place] Name 07/25/18 1422 08/05/18 0741   Current issues with  Current Stress Concerns;History of Depression;Current Anxiety/Panic;Current Depression  Current Stress Concerns;History of Depression;Current Anxiety/Panic;Current Depression   Comments  pt arrived at cardiac rehab tearful.  pt grieving today.  pt states the holidays have been overwhelming for her as this is the first year since her husbands death she has decorated and celebrated.  anniversary of husbands death is in a few weeks.    pt has supportive friends and family.    pt arrived at cardiac rehab tearful.  pt grieving today.  pt states the holidays have been overwhelming for her as this is the first year since her husbands death she has decorated and celebrated.  anniversary of husbands death is in a few weeks.    pt has supportive friends and family.  pt appears more upbeat and positive post holidays.    Expected Outcomes  pt will exhibit normal grief pattern, improved outlook and coping skills.   pt will exhibit normal grief pattern, improved outlook and coping skills.    Interventions  Encouraged to attend Cardiac Rehabilitation for the  exercise  Encouraged to attend Cardiac Rehabilitation for the exercise   Continue Psychosocial Services   No Follow up required  No Follow up required       Initial Review    Row Name 07/25/18 1422 08/05/18 0741   Source of Stress Concerns  Chronic Illness;Family grief   no documentation          Psychosocial Discharge (Final Psychosocial Re-Evaluation): Psychosocial Re-Evaluation - 08/05/18 0741    Psychosocial Re-Evaluation          Current issues with  Current Stress Concerns;History of Depression;Current Anxiety/Panic;Current Depression    Comments  pt arrived at cardiac rehab tearful.  pt grieving today.  pt states the holidays have been overwhelming for her as this is the first year since her husbands death she has decorated and celebrated.  anniversary of husbands death is in a few weeks.    pt has supportive friends and family.  pt appears more upbeat and positive post holidays.     Expected Outcomes  pt will exhibit normal grief pattern, improved outlook and coping skills.     Interventions  Encouraged to attend Cardiac Rehabilitation for the exercise    Continue Psychosocial Services   No Follow up required           Vocational Rehabilitation: Provide vocational rehab assistance to qualifying candidates.   Vocational Rehab Evaluation & Intervention: Vocational Rehab - 07/12/18 1206    Initial Vocational Rehab Evaluation & Intervention          Assessment shows need for Vocational Rehabilitation  No           Education: Education Goals: Education classes will be provided on a weekly basis, covering required topics. Participant will state understanding/return demonstration of topics presented.  Learning Barriers/Preferences: Learning Barriers/Preferences - 07/12/18 1043    Learning Barriers/Preferences          Learning Barriers  Sight    Learning Preferences  Skilled Demonstration;Pictoral;Video           Education Topics: Count Your Pulse:  -Group  instruction provided by verbal instruction, demonstration, patient participation and written materials to support subject.  Instructors address importance of being able to find your pulse and how to count your pulse when at home without a heart monitor.  Patients get hands on experience counting their pulse with staff help and individually.   Heart Attack, Angina, and Risk Factor Modification:  -Group instruction provided by verbal instruction, video, and written materials to support subject.  Instructors address signs and symptoms of angina and heart attacks.    Also discuss risk factors for heart disease and how to make changes to improve heart health risk factors.   Functional Fitness:  -Group instruction provided by verbal instruction, demonstration, patient participation, and written materials to support subject.  Instructors address safety measures for doing things around the house.  Discuss how to get up and down off the floor, how to pick things up properly, how to safely get out of a chair without assistance, and balance training.   Meditation and Mindfulness:  -Group instruction provided by verbal instruction, patient participation, and written materials to support subject.  Instructor addresses importance of mindfulness and meditation practice to help reduce stress and improve awareness.  Instructor also leads participants through a meditation exercise.    Stretching for Flexibility and Mobility:  -Group instruction provided by verbal instruction, patient participation, and written materials to support subject.  Instructors lead participants through series of stretches that are designed to increase flexibility thus improving mobility.  These stretches are additional exercise for major muscle groups that are typically performed during regular warm up and cool down.   Hands Only CPR:  -Group verbal, video, and participation provides a basic overview of AHA guidelines for community CPR.  Role-play of emergencies allow participants the opportunity to practice calling for help and chest compression technique with discussion of AED use.   Hypertension: -Group verbal and written instruction that provides a basic overview of hypertension including the most recent diagnostic guidelines, risk factor reduction with self-care instructions and medication management. Flowsheet Row CARDIAC REHAB PHASE II EXERCISE from 08/05/2018 in [place]  Date  08/05/18  Instruction Review Code  2- Demonstrated Understanding       Nutrition I class: Heart Healthy Eating:  -Group instruction provided by PowerPoint slides, verbal discussion, and written materials to support subject matter. The instructor gives an explanation and review of the Therapeutic Lifestyle Changes diet recommendations, which includes a discussion on lipid goals, dietary fat, sodium, fiber, plant stanol/sterol esters, sugar, and the components of a well-balanced, healthy diet.   Nutrition II class: Lifestyle Skills:  -Group instruction provided by PowerPoint slides, verbal discussion, and written materials to support subject matter. The instructor gives an explanation and review of label reading, grocery shopping for heart health, heart healthy recipe modifications, and ways to make healthier choices when eating out.   Diabetes Question & Answer:  -Group instruction provided by PowerPoint slides, verbal discussion, and written materials to support subject matter. The  instructor gives an explanation and review of diabetes co-morbidities, pre- and post-prandial blood glucose goals, pre-exercise blood glucose goals, signs, symptoms, and treatment of hypoglycemia and hyperglycemia, and foot care basics.   Diabetes Blitz:  -Group instruction provided by PowerPoint slides, verbal discussion, and written materials to support subject matter. The instructor gives an explanation and review of the physiology  behind type 1 and type 2 diabetes, diabetes medications and rational behind using different medications, pre- and post-prandial blood glucose recommendations and Hemoglobin A1c goals, diabetes diet, and exercise including blood glucose guidelines for exercising safely.    Portion Distortion:  -Group instruction provided by PowerPoint slides, verbal discussion, written materials, and food models to support subject matter. The instructor gives an explanation of serving size versus portion size, changes in portions sizes over the last 20 years, and what consists of a serving from each food group.   Stress Management:  -Group instruction provided by verbal instruction, video, and written materials to support subject matter.  Instructors review role of stress in heart disease and how to cope with stress positively.     Exercising on Your Own:  -Group instruction provided by verbal instruction, power point, and written materials to support subject.  Instructors discuss benefits of exercise, components of exercise, frequency and intensity of exercise, and end points for exercise.  Also discuss use of nitroglycerin and activating EMS.  Review options of places to exercise outside of rehab.  Review guidelines for sex with heart disease.   Cardiac Drugs I:  -Group instruction provided by verbal instruction and written materials to support subject.  Instructor reviews cardiac drug classes: antiplatelets, anticoagulants, beta blockers, and statins.  Instructor discusses reasons, side effects, and lifestyle considerations for each drug class.   Cardiac Drugs II:  -Group instruction provided by verbal instruction and written materials to support subject.  Instructor reviews cardiac drug classes: angiotensin converting enzyme inhibitors (ACE-I), angiotensin II receptor blockers (ARBs), nitrates, and calcium channel blockers.  Instructor discusses reasons, side effects, and lifestyle considerations for each drug  class.   Anatomy and Physiology of the Circulatory System:  Group verbal and written instruction and models provide basic cardiac anatomy and physiology, with the coronary electrical and arterial systems. Review of: AMI, Angina, Valve disease, Heart Failure, Peripheral Artery Disease, Cardiac Arrhythmia, Pacemakers, and the ICD.   Other Education:  -Group or individual verbal, written, or video instructions that support the educational goals of the cardiac rehab program.   Holiday Eating Survival Tips:  -Group instruction provided by PowerPoint slides, verbal discussion, and written materials to support subject matter. The instructor gives patients tips, tricks, and techniques to help them not only survive but enjoy the holidays despite the onslaught of food that accompanies the holidays.   Knowledge Questionnaire Score: Knowledge Questionnaire Score - 07/12/18 0907    Knowledge Questionnaire Score          Pre Score  17/24           Core Components/Risk Factors/Patient Goals at Admission: Personal Goals and Risk Factors at Admission - 07/12/18 1045    Core Components/Risk Factors/Patient Goals on Admission           Weight Management  Yes;Obesity;Weight Maintenance;Weight Loss    Intervention  Weight Management: Develop a combined nutrition and exercise program designed to reach desired caloric intake, while maintaining appropriate intake of nutrient and fiber, sodium and fats, and appropriate energy expenditure required for the weight goal.;Weight Management: Provide education and appropriate resources to help participant  work on and attain dietary goals.;Weight Management/Obesity: Establish reasonable short term and long term weight goals.;Obesity: Provide education and appropriate resources to help participant work on and attain dietary goals.    Admit Weight  189 lb 13.1 oz (86.1 kg)    Expected Outcomes  Short Term: Continue to assess and modify interventions until short term  weight is achieved;Long Term: Adherence to nutrition and physical activity/exercise program aimed toward attainment of established weight goal;Weight Maintenance: Understanding of the daily nutrition guidelines, which includes 25-35% calories from fat, 7% or less cal from saturated fats, less than 211m cholesterol, less than 1.5gm of sodium, & 5 or more servings of fruits and vegetables daily;Weight Loss: Understanding of general recommendations for a balanced deficit meal plan, which promotes 1-2 lb weight loss per week and includes a negative energy balance of [phone] kcal/d;Understanding recommendations for meals to include 15-35% energy as protein, 25-35% energy from fat, 35-60% energy from carbohydrates, less than 2027mof dietary cholesterol, 20-35 gm of total fiber daily;Understanding of distribution of calorie intake throughout the day with the consumption of 4-5 meals/snacks    Hypertension  Yes    Intervention  Provide education on lifestyle modifcations including regular physical activity/exercise, weight management, moderate sodium restriction and increased consumption of fresh fruit, vegetables, and low fat dairy, alcohol moderation, and smoking cessation.;Monitor prescription use compliance.    Expected Outcomes  Long Term: Maintenance of blood pressure at goal levels.;Short Term: Continued assessment and intervention until BP is < 140/9076mG in hypertensive participants. < 130/50m73m in hypertensive participants with diabetes, heart failure or chronic kidney disease.    Lipids  Yes    Intervention  Provide education and support for participant on nutrition & aerobic/resistive exercise along with prescribed medications to achieve LDL <70mg79mL >40mg.47mExpected Outcomes  Short Term: Participant states understanding of desired cholesterol values and is compliant with medications prescribed. Participant is following exercise prescription and nutrition guidelines.;Long Term: Cholesterol  controlled with medications as prescribed, with individualized exercise RX and with personalized nutrition plan. Value goals: LDL < 70mg, 74m> 40 mg.    Stress  Yes    Intervention  Offer individual and/or small group education and counseling on adjustment to heart disease, stress management and health-related lifestyle change. Teach and support self-help strategies.;Refer participants experiencing significant psychosocial distress to appropriate mental health specialists for further evaluation and treatment. When possible, include family members and significant others in education/counseling sessions.    Expected Outcomes  Short Term: Participant demonstrates changes in health-related behavior, relaxation and other stress management skills, ability to obtain effective social support, and compliance with psychotropic medications if prescribed.;Long Term: Emotional wellbeing is indicated by absence of clinically significant psychosocial distress or social isolation.           Core Components/Risk Factors/Patient Goals Review:  Goals and Risk Factor Review    Core Components/Risk Factors/Patient Goals Review    Row Name 07/22/18 1435 08/05/18 0742   Personal Goals Review  Weight Management/Obesity;Hypertension;Lipids;Stress  Weight Management/Obesity;Hypertension;Lipids;Stress   Review  pt with multiple CAD RF demonstrates eagerness to participate in CR program. pt personal goals are to increase pace of activities with increased strength/stamina, resume normal activities and lose weight.    pt with multiple CAD RF demonstrates eagerness to participate in CR program. pt personal goals are to increase pace of activities with increased strength/stamina, resume normal activities and lose weight.  pt pleased to be adjusting to her new schedule.  pt is  not participating in [place].    Expected Outcomes  pt will participate in CR exercise, nutrition and lifestyle modification opportunities to decrease overall RF.     pt will participate in CR exercise, nutrition and lifestyle modification opportunities to decrease overall RF.            Core Components/Risk Factors/Patient Goals at Discharge (Final Review):  Goals and Risk Factor Review - 08/05/18 0742    Core Components/Risk Factors/Patient Goals Review          Personal Goals Review  Weight Management/Obesity;Hypertension;Lipids;Stress    Review  pt with multiple CAD RF demonstrates eagerness to participate in CR program. pt personal goals are to increase pace of activities with increased strength/stamina, resume normal activities and lose weight.  pt pleased to be adjusting to her new schedule.  pt is not participating in [facility].     Expected Outcomes  pt will participate in CR exercise, nutrition and lifestyle modification opportunities to decrease overall RF.             ITP Comments: ITP Comments    Row Name 07/12/18 5643 07/22/18 1422 08/10/18 1627   ITP Comments  Dr. Fransico Him, Medical Director   pt started group exercise. pt tolerated light activity without difficulty. pt oriented to exercise equipment and safety routine. pt verbalized understanding.   30 day ITP review. pt demonstrates willingness to participate in CR program.       Comments:

## 2018-08-11 ENCOUNTER — Ambulatory Visit (HOSPITAL_COMMUNITY): Payer: Medicare Other

## 2018-08-12 ENCOUNTER — Encounter (HOSPITAL_COMMUNITY)
Admission: RE | Admit: 2018-08-12 | Discharge: 2018-08-12 | Disposition: A | Discharge status: Home / Self Care | Payer: Medicare Other | Source: Ambulatory Visit | Attending: Cardiovascular Disease | Admitting: Cardiovascular Disease

## 2018-08-12 DIAGNOSIS — Z955 Presence of coronary angioplasty implant and graft: Secondary | ICD-10-CM | POA: Diagnosis not present

## 2018-08-15 ENCOUNTER — Ambulatory Visit (HOSPITAL_COMMUNITY): Payer: Medicare Other

## 2018-08-15 ENCOUNTER — Encounter (HOSPITAL_COMMUNITY)
Admission: RE | Admit: 2018-08-15 | Discharge: 2018-08-15 | Disposition: A | Discharge status: Home / Self Care | Payer: Medicare Other | Source: Ambulatory Visit | Attending: Cardiovascular Disease | Admitting: Cardiovascular Disease

## 2018-08-15 DIAGNOSIS — Z955 Presence of coronary angioplasty implant and graft: Secondary | ICD-10-CM

## 2018-08-17 ENCOUNTER — Encounter (HOSPITAL_COMMUNITY)
Admission: RE | Admit: 2018-08-17 | Discharge: 2018-08-17 | Disposition: A | Discharge status: Home / Self Care | Payer: Medicare Other | Source: Ambulatory Visit | Attending: Cardiovascular Disease | Admitting: Cardiovascular Disease

## 2018-08-17 ENCOUNTER — Ambulatory Visit (HOSPITAL_COMMUNITY): Payer: Medicare Other

## 2018-08-17 DIAGNOSIS — Z955 Presence of coronary angioplasty implant and graft: Secondary | ICD-10-CM

## 2018-08-19 ENCOUNTER — Encounter (HOSPITAL_COMMUNITY)
Admission: RE | Admit: 2018-08-19 | Discharge: 2018-08-19 | Disposition: A | Discharge status: Home / Self Care | Payer: Medicare Other | Source: Ambulatory Visit | Attending: Cardiovascular Disease | Admitting: Cardiovascular Disease

## 2018-08-19 ENCOUNTER — Ambulatory Visit (HOSPITAL_COMMUNITY): Payer: Medicare Other

## 2018-08-19 DIAGNOSIS — Z955 Presence of coronary angioplasty implant and graft: Secondary | ICD-10-CM | POA: Diagnosis not present

## 2018-08-22 ENCOUNTER — Ambulatory Visit (HOSPITAL_COMMUNITY): Payer: Medicare Other

## 2018-08-22 ENCOUNTER — Encounter (HOSPITAL_COMMUNITY)
Admission: RE | Admit: 2018-08-22 | Discharge: 2018-08-22 | Disposition: A | Discharge status: Home / Self Care | Payer: Medicare Other | Source: Ambulatory Visit | Attending: Cardiovascular Disease | Admitting: Cardiovascular Disease

## 2018-08-22 DIAGNOSIS — Z955 Presence of coronary angioplasty implant and graft: Secondary | ICD-10-CM

## 2018-08-24 ENCOUNTER — Ambulatory Visit (HOSPITAL_COMMUNITY): Payer: Medicare Other

## 2018-08-24 ENCOUNTER — Encounter (HOSPITAL_COMMUNITY)
Admission: RE | Admit: 2018-08-24 | Discharge: 2018-08-24 | Disposition: A | Discharge status: Home / Self Care | Payer: Medicare Other | Source: Ambulatory Visit | Attending: Cardiovascular Disease | Admitting: Cardiovascular Disease

## 2018-08-24 DIAGNOSIS — Z955 Presence of coronary angioplasty implant and graft: Secondary | ICD-10-CM | POA: Diagnosis not present

## 2018-08-26 ENCOUNTER — Ambulatory Visit (HOSPITAL_COMMUNITY): Payer: Medicare Other

## 2018-08-26 ENCOUNTER — Encounter (HOSPITAL_COMMUNITY)
Admission: RE | Admit: 2018-08-26 | Discharge: 2018-08-26 | Disposition: A | Discharge status: Home / Self Care | Payer: Medicare Other | Source: Ambulatory Visit | Attending: Cardiovascular Disease | Admitting: Cardiovascular Disease

## 2018-08-26 DIAGNOSIS — Z955 Presence of coronary angioplasty implant and graft: Secondary | ICD-10-CM | POA: Diagnosis not present

## 2018-08-29 ENCOUNTER — Ambulatory Visit (HOSPITAL_COMMUNITY): Payer: Medicare Other

## 2018-08-29 ENCOUNTER — Encounter (HOSPITAL_COMMUNITY)
Admission: RE | Admit: 2018-08-29 | Discharge: 2018-08-29 | Disposition: A | Discharge status: Home / Self Care | Payer: Medicare Other | Source: Ambulatory Visit | Attending: Cardiovascular Disease | Admitting: Cardiovascular Disease

## 2018-08-29 DIAGNOSIS — Z955 Presence of coronary angioplasty implant and graft: Secondary | ICD-10-CM | POA: Diagnosis not present

## 2018-08-31 ENCOUNTER — Ambulatory Visit (HOSPITAL_COMMUNITY): Payer: Medicare Other

## 2018-08-31 ENCOUNTER — Encounter (HOSPITAL_COMMUNITY)
Admission: RE | Admit: 2018-08-31 | Discharge: 2018-08-31 | Disposition: A | Discharge status: Home / Self Care | Payer: Medicare Other | Source: Ambulatory Visit | Attending: Cardiovascular Disease | Admitting: Cardiovascular Disease

## 2018-08-31 DIAGNOSIS — Z955 Presence of coronary angioplasty implant and graft: Secondary | ICD-10-CM | POA: Diagnosis not present

## 2018-09-01 ENCOUNTER — Other Ambulatory Visit: Payer: Self-pay | Admitting: Cardiology

## 2018-09-02 ENCOUNTER — Encounter (HOSPITAL_COMMUNITY)
Admission: RE | Admit: 2018-09-02 | Discharge: 2018-09-02 | Disposition: A | Discharge status: Home / Self Care | Payer: Medicare Other | Source: Ambulatory Visit | Attending: Cardiovascular Disease | Admitting: Cardiovascular Disease

## 2018-09-02 ENCOUNTER — Ambulatory Visit (HOSPITAL_COMMUNITY): Payer: Medicare Other

## 2018-09-02 DIAGNOSIS — Z955 Presence of coronary angioplasty implant and graft: Secondary | ICD-10-CM | POA: Diagnosis not present

## 2018-09-05 ENCOUNTER — Ambulatory Visit (HOSPITAL_COMMUNITY): Payer: Medicare Other

## 2018-09-05 ENCOUNTER — Encounter (HOSPITAL_COMMUNITY)
Admission: RE | Admit: 2018-09-05 | Discharge: 2018-09-05 | Disposition: A | Discharge status: Home / Self Care | Payer: Medicare Other | Source: Ambulatory Visit | Attending: Cardiovascular Disease | Admitting: Cardiovascular Disease

## 2018-09-05 DIAGNOSIS — Z955 Presence of coronary angioplasty implant and graft: Secondary | ICD-10-CM | POA: Diagnosis not present

## 2018-09-05 DIAGNOSIS — R079 Chest pain, unspecified: Secondary | ICD-10-CM | POA: Insufficient documentation

## 2018-09-05 DIAGNOSIS — I10 Essential (primary) hypertension: Secondary | ICD-10-CM | POA: Diagnosis not present

## 2018-09-05 DIAGNOSIS — Z888 Allergy status to other drugs, medicaments and biological substances status: Secondary | ICD-10-CM | POA: Insufficient documentation

## 2018-09-05 DIAGNOSIS — E785 Hyperlipidemia, unspecified: Secondary | ICD-10-CM | POA: Diagnosis not present

## 2018-09-05 DIAGNOSIS — Z886 Allergy status to analgesic agent status: Secondary | ICD-10-CM | POA: Insufficient documentation

## 2018-09-05 DIAGNOSIS — R9431 Abnormal electrocardiogram [ECG] [EKG]: Secondary | ICD-10-CM | POA: Insufficient documentation

## 2018-09-05 DIAGNOSIS — I2510 Atherosclerotic heart disease of native coronary artery without angina pectoris: Secondary | ICD-10-CM | POA: Insufficient documentation

## 2018-09-07 ENCOUNTER — Ambulatory Visit (HOSPITAL_COMMUNITY): Payer: Medicare Other

## 2018-09-07 ENCOUNTER — Encounter (HOSPITAL_COMMUNITY)
Admission: RE | Admit: 2018-09-07 | Discharge: 2018-09-07 | Disposition: A | Discharge status: Home / Self Care | Payer: Medicare Other | Source: Ambulatory Visit | Attending: Cardiovascular Disease | Admitting: Cardiovascular Disease

## 2018-09-07 DIAGNOSIS — Z955 Presence of coronary angioplasty implant and graft: Secondary | ICD-10-CM

## 2018-09-08 NOTE — Progress Notes (Signed)
Cardiac Individual Treatment Plan  Patient Details  Name: Monique [name] MRN: [id number] Date of Birth: 1941/05/24 Referring Provider:   Flowsheet Row CARDIAC REHAB PHASE II ORIENTATION from 07/12/2018 in [facility]  Referring Provider  Dr. Gwenlyn Found      Initial Encounter Date:  Flowsheet Row CARDIAC REHAB PHASE II ORIENTATION from 07/12/2018 in [facility]  Date  07/12/18      Visit Diagnosis: 05/30/2018 Stented coronary artery  Patient's Home Medications on Admission:  Current Outpatient Medications:  .  aspirin EC 81 MG tablet, Take 81 mg by mouth daily., Disp: , Rfl:  .  atorvastatin (LIPITOR) 80 MG tablet, Take 1 tablet (80 mg total) by mouth daily at 6 PM., Disp: 90 tablet, Rfl: 2 .  carvedilol (COREG) 3.125 MG tablet, TAKE 1 TABLET BY MOUTH TWICE DAILY WITH A MEAL, Disp: 60 tablet, Rfl: 11 .  cetirizine (ZYRTEC) 10 MG tablet, Take 10 mg by mouth at bedtime. , Disp: , Rfl:  .  Cholecalciferol (VITAMIN D PO), Take 1 tablet by mouth daily., Disp: , Rfl:  .  clopidogrel (PLAVIX) 75 MG tablet, Take 1 tablet (75 mg total) by mouth daily with breakfast., Disp: 90 tablet, Rfl: 2 .  ferrous sulfate (FERROUSUL) 325 (65 FE) MG tablet, Take 1 tablet (325 mg total) by mouth 2 (two) times daily with a meal., Disp: 180 tablet, Rfl: 2 .  isosorbide mononitrate (IMDUR) 30 MG 24 hr tablet, Take 0.5 tablets (15 mg total) by mouth daily., Disp: 90 tablet, Rfl: 1 .  lisinopril (PRINIVIL,ZESTRIL) 40 MG tablet, Take 40 mg by mouth daily., Disp: , Rfl:  .  magnesium oxide (MAG-OX) 400 MG tablet, Take 400 mg by mouth daily., Disp: , Rfl:  .  nitroGLYCERIN (NITROSTAT) 0.4 MG SL tablet, Place 1 tablet (0.4 mg total) under the tongue every 5 (five) minutes as needed., Disp: 25 tablet, Rfl: 2 .  oxyCODONE-acetaminophen (PERCOCET) 7.5-325 MG tablet, Take 1 tablet by mouth every 4 (four) hours. , Disp: , Rfl:  .  pantoprazole (PROTONIX) 40 MG  tablet, Take 1 tablet (40 mg total) by mouth 2 (two) times daily before a meal., Disp: 30 tablet, Rfl: 0 .  polyethylene glycol (MIRALAX / GLYCOLAX) packet, Take 17 g by mouth 2 (two) times daily. (Patient taking differently: Take 17 g by mouth at bedtime. ), Disp: 14 each, Rfl: 0 .  venlafaxine XR (EFFEXOR-XR) 75 MG 24 hr capsule, Take 225 mg by mouth at bedtime. , Disp: , Rfl: 0  Past Medical History: Past Medical History:  Diagnosis Date  . Anemia     mild as per PCP  . Arthritis    hip/ s/p recent shoulder fracture 8/12- RIGHT  . Asthma   . Coronary artery disease 05/30/2018   LAD PCI/DES  . GI bleeding   . Hiatal hernia   . Humerus fracture    with wrist on right side  . Hypertension    hypercholesterolemia/  EKG on chart with clearance and note 06/18/11  [facility]  . Pneumonia     Tobacco Use: Social History   Tobacco Use  Smoking Status Never Smoker  Smokeless Tobacco Never Used    Labs: Recent Review Flowsheet Data    Labs for ITP Cardiac and Pulmonary Rehab Latest Ref Rng & Units 08/24/2016 06/01/2018   Cholestrol 0 - 200 mg/dL - 135   LDLCALC 0 - 99 mg/dL - 56   HDL >40 mg/dL - 49  Trlycerides <150 mg/dL - 150(H)   Hemoglobin A1c 4.8 - 5.6 % 5.9(H) -      Capillary Blood Glucose: No results found for: GLUCAP   Exercise Target Goals: Exercise Program Goal: Individual exercise prescription set using results from initial 6 min walk test and THRR while considering  patient's activity barriers and safety.   Exercise Prescription Goal: Initial exercise prescription builds to 30-45 minutes a day of aerobic activity, 2-3 days per week.  Home exercise guidelines will be given to patient during program as part of exercise prescription that the participant will acknowledge.  Activity Barriers & Risk Stratification: Activity Barriers & Cardiac Risk Stratification - 07/12/18 1052    Activity Barriers & Cardiac Risk Stratification          Cardiac Risk  Stratification  High           6 Minute Walk: 6 Minute Walk    6 Minute Walk    Row Name 07/12/18 1038   Phase  Initial   Distance  976 feet   Walk Time  6 minutes   # of Rest Breaks  1   MPH  1.9   METS  1.9   RPE  13   VO2 Peak  6.65   Symptoms  Yes (comment)   Comments  Knee and back pain   Resting HR  98 bpm   Resting BP  108/70   Resting Oxygen Saturation   94 %   Exercise Oxygen Saturation  during 6 min walk  94 %   Max Ex. HR  117 bpm   Max Ex. BP  142/72   2 Minute Post BP  120/70          Oxygen Initial Assessment:   Oxygen Re-Evaluation:   Oxygen Discharge (Final Oxygen Re-Evaluation):   Initial Exercise Prescription: Initial Exercise Prescription - 07/12/18 1000    Date of Initial Exercise RX and Referring Provider          Date  07/12/18    Referring Provider  Dr. Gwenlyn Found    Expected Discharge Date  10/21/18        NuStep          Level  2    SPM  75    Minutes  10    METs  1.8        Arm Ergometer          Level  1    Watts  15    Minutes  10    METs  1.93        Track          Laps  6    Minutes  10    METs  2        Prescription Details          Frequency (times per week)  3    Duration  Progress to 30 minutes of continuous aerobic without signs/symptoms of physical distress        Intensity          THRR 40-80% of Max Heartrate  57-114    Ratings of Perceived Exertion  11-13        Progression          Progression  Continue to progress workloads to maintain intensity without signs/symptoms of physical distress.        Resistance Training          Training Prescription  Yes    Weight  2 lbs.     Reps  10-15           Perform Capillary Blood Glucose checks as needed.  Exercise Prescription Changes: Exercise Prescription Changes    Response to Exercise    Row Name 08/02/18 1500 08/05/18 1400 09/05/18 1500   Blood Pressure (Admit)  132/70  128/84  104/60   Blood Pressure (Exercise)  138/78  152/70   144/82   Blood Pressure (Exit)  120/62  112/70  120/80   Heart Rate (Admit)  99 bpm  99 bpm  98 bpm   Heart Rate (Exercise)  119 bpm  116 bpm  122 bpm   Heart Rate (Exit)  97 bpm  87 bpm  86 bpm   Rating of Perceived Exertion (Exercise)  '13  12  13   ' Symptoms  no documentation  no documentation  None   Duration  Progress to 30 minutes of  aerobic without signs/symptoms of physical distress  Progress to 30 minutes of  aerobic without signs/symptoms of physical distress  Progress to 30 minutes of  aerobic without signs/symptoms of physical distress   Intensity  THRR unchanged  THRR unchanged  THRR unchanged       Progression    Row Name 08/02/18 1500 08/05/18 1400 09/05/18 1500   Progression  Continue to progress workloads to maintain intensity without signs/symptoms of physical distress.  Continue to progress workloads to maintain intensity without signs/symptoms of physical distress.  Continue to progress workloads to maintain intensity without signs/symptoms of physical distress.   Average METs  2.1  1.9  2.7       Resistance Training    Row Name 08/02/18 1500 08/05/18 1400 09/05/18 1500   Training Prescription  Yes  Yes  Yes   Weight  2 lbs.   3 lbs.  3 lbs.   Reps  10-15  10-15  10-15   Time  10 Minutes  [place] Name 08/02/18 1500 08/05/18 1400 09/05/18 1500   MPH  no documentation  no documentation  1.9   Grade  no documentation  no documentation  0   Minutes  no documentation  no documentation  10   METs  no documentation  no documentation  2.38       NuStep    Row Name 08/02/18 1500 08/05/18 1400 09/05/18 1500   Level  '3  3  3   ' SPM  75  75  75   Minutes  '10  10  10   ' METs  1.8  1.8  3.6       Track    Row Name 08/02/18 1500 08/05/18 1400 09/05/18 1500   Laps  '10  8  10   ' Minutes  '10  10  10   ' METs  2.76  2.05  2.05       Home Exercise Plan    Row Name 08/02/18 1500 08/05/18 1400 09/05/18 1500   Plans to continue exercise  at  no documentation  Home (comment)  Home (comment)   Frequency  no documentation  Add 1 additional day to program exercise sessions.  Add 1 additional day to program exercise sessions.   Initial Home Exercises Provided  no documentation  08/05/18  08/05/18          Exercise Comments: Exercise Comments    Row Name 08/02/18 1533 08/05/18 1456 09/08/18 1054   Exercise Comments  Reviewed METs and goals with Pt.   Reviewed HEP with Pt. Encourgaed Pt to exercise at home.   Reviewed METs and goals with Pt. Pt is tolerating exercise well.       Exercise Goals and Review: Exercise Goals    Exercise Goals    Row Name 07/12/18 1041   Increase Physical Activity  Yes   Intervention  Provide advice, education, support and counseling about physical activity/exercise needs.;Develop an individualized exercise prescription for aerobic and resistive training based on initial evaluation findings, risk stratification, comorbidities and participant's personal goals.   Expected Outcomes  Short Term: Attend rehab on a regular basis to increase amount of physical activity.   Increase Strength and Stamina  Yes   Intervention  Provide advice, education, support and counseling about physical activity/exercise needs.;Develop an individualized exercise prescription for aerobic and resistive training based on initial evaluation findings, risk stratification, comorbidities and participant's personal goals.   Expected Outcomes  Short Term: Increase workloads from initial exercise prescription for resistance, speed, and METs.   Able to understand and use rate of perceived exertion (RPE) scale  Yes   Intervention  Provide education and explanation on how to use RPE scale   Expected Outcomes  Short Term: Able to use RPE daily in rehab to express subjective intensity level;Long Term:  Able to use RPE to guide intensity level when exercising independently   Knowledge and understanding of Target Heart Rate Range (THRR)  Yes    Intervention  Provide education and explanation of THRR including how the numbers were predicted and where they are located for reference   Expected Outcomes  Short Term: Able to state/look up THRR;Long Term: Able to use THRR to govern intensity when exercising independently;Short Term: Able to use daily as guideline for intensity in rehab   Able to check pulse independently  Yes   Intervention  Provide education and demonstration on how to check pulse in carotid and radial arteries.;Review the importance of being able to check your own pulse for safety during independent exercise   Expected Outcomes  Short Term: Able to explain why pulse checking is important during independent exercise;Long Term: Able to check pulse independently and accurately   Understanding of Exercise Prescription  Yes   Intervention  Provide education, explanation, and written materials on patient's individual exercise prescription   Expected Outcomes  Short Term: Able to explain program exercise prescription;Long Term: Able to explain home exercise prescription to exercise independently          Exercise Goals Re-Evaluation : Exercise Goals Re-Evaluation    Exercise Goal Re-Evaluation    Row Name 08/02/18 1532 08/05/18 1454 09/08/18 1046   Exercise Goals Review  Increase Physical Activity;Increase Strength and Stamina;Able to check pulse independently;Understanding of Exercise Prescription;Knowledge and understanding of Target Heart Rate Range (THRR);Able to understand and use rate of perceived exertion (RPE) scale  Increase Physical Activity;Increase Strength and Stamina;Able to check pulse independently;Understanding of Exercise Prescription;Knowledge and understanding of Target Heart Rate Range (THRR);Able to understand and use rate of perceived exertion (RPE) scale  Increase Physical Activity;Increase Strength and Stamina;Able to understand and use rate of perceived exertion (RPE) scale;Knowledge and understanding of  Target Heart Rate Range (THRR);Understanding of Exercise Prescription;Able to check pulse independently   Comments  Reviewed METs and goals with Pt. MET level is 2.1. Pt has some pain with exercise due to arthritis and deconditioning.   Reviewed HEP with Pt. Pt is not currently exercising at home. Encouraged Pt to walk  2-4 days for 30 minutes in addition to cardiac rehab.   Reviewed METs and goals with Pt. Pt has a MET level of 2.7 and continues to increase resistance and workloads gradually. Pt is not exercising at home currently. Encouraged Pt to walk 2-3 days per week for 30 minutes in addition to the program.    Expected Outcomes  Will continue to monitor and progress Pt as tolerated.   Will continue to monitor and progress Pt as tolerated.   Will continue to monitor and progress Pt as tolerated.           Discharge Exercise Prescription (Final Exercise Prescription Changes): Exercise Prescription Changes - 09/05/18 1500    Response to Exercise          Blood Pressure (Admit)  104/60    Blood Pressure (Exercise)  144/82    Blood Pressure (Exit)  120/80    Heart Rate (Admit)  98 bpm    Heart Rate (Exercise)  122 bpm    Heart Rate (Exit)  86 bpm    Rating of Perceived Exertion (Exercise)  13    Symptoms  None    Duration  Progress to 30 minutes of  aerobic without signs/symptoms of physical distress    Intensity  THRR unchanged        Progression          Progression  Continue to progress workloads to maintain intensity without signs/symptoms of physical distress.    Average METs  2.7        Resistance Training          Training Prescription  Yes    Weight  3 lbs.    Reps  10-15    Time  10 Minutes        Treadmill          MPH  1.9    Grade  0    Minutes  10    METs  2.38        NuStep          Level  3    SPM  75    Minutes  10    METs  3.6        Track          Laps  10    Minutes  10    METs  2.05        Home Exercise Plan          Plans to  continue exercise at  Home (comment)    Frequency  Add 1 additional day to program exercise sessions.    Initial Home Exercises Provided  08/05/18           Nutrition:  Target Goals: Understanding of nutrition guidelines, daily intake of sodium <1526m, cholesterol <2068m calories 30% from fat and 7% or less from saturated fats, daily to have 5 or more servings of fruits and vegetables.  Biometrics: Pre Biometrics - 07/12/18 1041    Pre Biometrics          Height  '5\' 5"'  (1.651 m)    Weight  86.1 kg    Waist Circumference  40 inches    Hip Circumference  43 inches    Waist to Hip Ratio  0.93 %    BMI (Calculated)  31.59    Triceps Skinfold  40 mm    % Body Fat  45.3 %    Grip Strength  18 kg    Flexibility  0 in    Single Leg Stand  2.97 seconds            Nutrition Therapy Plan and Nutrition Goals: Nutrition Therapy & Goals - 07/12/18 1026    Nutrition Therapy          Diet  heart healthy, diabetic        Personal Nutrition Goals          Nutrition Goal  Pt to identify and limit food sources of saturated fat, trans fat, refined carbohydrates and sodium    Personal Goal #2  Pt able to name foods that affect blood glucose.    Personal Goal #3  Pt to explore batch cooking, meal prepping, and new recipes     Personal Goal #4  Pt to identify food quantities necessary to achieve weight loss of 6-15 lbs. at graduation from cardiac rehab.        Intervention Plan          Intervention  Prescribe, educate and counsel regarding individualized specific dietary modifications aiming towards targeted core components such as weight, hypertension, lipid management, diabetes, heart failure and other comorbidities.    Expected Outcomes  Short Term Goal: Understand basic principles of dietary content, such as calories, fat, sodium, cholesterol and nutrients.;Long Term Goal: Adherence to prescribed nutrition plan.           Nutrition Assessments: Nutrition Assessments - 07/12/18  1027    MEDFICTS Scores          Pre Score  13           Nutrition Goals Re-Evaluation:   Nutrition Goals Re-Evaluation:   Nutrition Goals Discharge (Final Nutrition Goals Re-Evaluation):   Psychosocial: Target Goals: Acknowledge presence or absence of significant depression and/or stress, maximize coping skills, provide positive support system. Participant is able to verbalize types and ability to use techniques and skills needed for reducing stress and depression.  Initial Review & Psychosocial Screening: Initial Psych Review & Screening - 07/12/18 1158    Initial Review          Current issues with  Current Depression        Family Dynamics          Good Support System?  Yes   family and hospice friends   Concerns  Recent loss of significant other    Comments  husband passed away         Barriers          Psychosocial barriers to participate in program  The patient should benefit from training in stress management and relaxation.        Screening Interventions          Interventions  Encouraged to exercise;To provide support and resources with identified psychosocial needs;Provide feedback about the scores to participant    Expected Outcomes  Short Term goal: Utilizing psychosocial counselor, staff and physician to assist with identification of specific Stressors or current issues interfering with healing process. Setting desired goal for each stressor or current issue identified.;Long Term Goal: Stressors or current issues are controlled or eliminated.;Short Term goal: Identification and review with participant of any Quality of Life or Depression concerns found by scoring the questionnaire.;Long Term goal: The participant improves quality of Life and PHQ9 Scores as seen by post scores and/or verbalization of changes           Quality of Life Scores: Quality of Life - 08/08/18 1411    Quality of Life Scores  Health/Function Pre  10.18 %   pt notes  improvement in symptoms however still c/o DOE affecting home care ADLs like bedmaking and dishwashing.    Socioeconomic Pre  23.71 %    Psych/Spiritual Pre  12.29 %   pt meets with Hospice group and individual therapy with positive results.   Family Pre  22.5 %    GLOBAL Pre  15.14 %   overall pt still coping with multiple stressors including personal health, family and grief.  pt accepting of offer to meet with Jeanella Craze. LM for his assistant to call to schedule pt.          Scores of 19 and below usually indicate a poorer quality of life in these areas.  A difference of  2-3 points is a clinically meaningful difference.  A difference of 2-3 points in the total score of the Quality of Life Index has been associated with significant improvement in overall quality of life, self-image, physical symptoms, and general health in studies assessing change in quality of life.  PHQ-9: Recent Review Flowsheet Data    Depression screen [facility] 2/9 07/22/2018   Decreased Interest 2   Down, Depressed, Hopeless 2   PHQ - 2 Score 4   Altered sleeping 1   Tired, decreased energy 2   Change in appetite 0   Feeling bad or failure about yourself  2   Trouble concentrating 0   Moving slowly or fidgety/restless 1   Suicidal thoughts 0   PHQ-9 Score 10   Difficult doing work/chores Very difficult     Interpretation of Total Score  Total Score Depression Severity:  1-4 = Minimal depression, 5-9 = Mild depression, 10-14 = Moderate depression, 15-19 = Moderately severe depression, 20-27 = Severe depression   Psychosocial Evaluation and Intervention: Psychosocial Evaluation - 07/22/18 1424    Psychosocial Evaluation & Interventions          Interventions  Stress management education;Encouraged to exercise with the program and follow exercise prescription;Relaxation education;Physician referral    Comments  pt with known depression and grief pattern demonstrates health related stress and anxiety. pt  reports "no joy or motivation" from daily activities. pt does have support systems and coping skills. pt has been advised to contact PCP for medication managements.      Expected Outcomes  pt will exhibit improved outlook and coping skills.     Continue Psychosocial Services   Follow up required by staff           Psychosocial Re-Evaluation: Psychosocial Re-Evaluation    Psychosocial Re-Evaluation    [place] Name 07/25/18 1422 08/05/18 0741 08/15/18 1630 09/08/18 [id number]   Current issues with  Current Stress Concerns;History of Depression;Current Anxiety/Panic;Current Depression  Current Stress Concerns;History of Depression;Current Anxiety/Panic;Current Depression  Current Stress Concerns;History of Depression;Current Anxiety/Panic;Current Depression  Current Stress Concerns;History of Depression;Current Anxiety/Panic;Current Depression   Comments  pt arrived at cardiac rehab tearful.  pt grieving today.  pt states the holidays have been overwhelming for her as this is the first year since her husbands death she has decorated and celebrated.  anniversary of husbands death is in a few weeks.    pt has supportive friends and family.    pt arrived at cardiac rehab tearful.  pt grieving today.  pt states the holidays have been overwhelming for her as this is the first year since her husbands death she has decorated and celebrated.  anniversary of husbands death is in a few weeks.  pt has supportive friends and family.  pt appears more upbeat and positive post holidays.   appt scheduled with Jeanella Craze 08/19/2018 @ 2:30.  written and verbal information given. pt verbalized understanding.    pt demonstrates improved outlook and coping skills since meeting with Jeanella Craze. pt demonstrates improved self confidence with exercise and rehab participation. pt seeking positive relationships with peers at [facility].   Expected Outcomes  pt will exhibit normal grief pattern, improved outlook and coping skills.   pt will  exhibit normal grief pattern, improved outlook and coping skills.   pt will exhibit normal grief pattern, improved outlook and coping skills.   pt will exhibit normal grief pattern, improved outlook and coping skills.    Interventions  Encouraged to attend Cardiac Rehabilitation for the exercise  Encouraged to attend Cardiac Rehabilitation for the exercise  Encouraged to attend Cardiac Rehabilitation for the exercise  Encouraged to attend Cardiac Rehabilitation for the exercise   Continue Psychosocial Services   No Follow up required  No Follow up required  no documentation  No Follow up required       Initial Review    Row Name 07/25/18 1422 08/05/18 0741 08/15/18 1630 09/08/18 0738   Source of Stress Concerns  Chronic Illness;Family grief   no documentation  no documentation  Chronic Illness;Family          Psychosocial Discharge (Final Psychosocial Re-Evaluation): Psychosocial Re-Evaluation - 09/08/18 2841    Psychosocial Re-Evaluation          Current issues with  Current Stress Concerns;History of Depression;Current Anxiety/Panic;Current Depression    Comments  pt demonstrates improved outlook and coping skills since meeting with Jeanella Craze. pt demonstrates improved self confidence with exercise and rehab participation. pt seeking positive relationships with peers at [facility].    Expected Outcomes  pt will exhibit normal grief pattern, improved outlook and coping skills.     Interventions  Encouraged to attend Cardiac Rehabilitation for the exercise    Continue Psychosocial Services   No Follow up required        Initial Review          Source of Stress Concerns  Chronic Illness;Family           Vocational Rehabilitation: Provide vocational rehab assistance to qualifying candidates.   Vocational Rehab Evaluation & Intervention: Vocational Rehab - 07/12/18 1206    Initial Vocational Rehab Evaluation & Intervention          Assessment shows need for Vocational Rehabilitation  No            Education: Education Goals: Education classes will be provided on a weekly basis, covering required topics. Participant will state understanding/return demonstration of topics presented.  Learning Barriers/Preferences: Learning Barriers/Preferences - 07/12/18 1043    Learning Barriers/Preferences          Learning Barriers  Sight    Learning Preferences  Skilled Demonstration;Pictoral;Video           Education Topics: Count Your Pulse:  -Group instruction provided by verbal instruction, demonstration, patient participation and written materials to support subject.  Instructors address importance of being able to find your pulse and how to count your pulse when at home without a heart monitor.  Patients get hands on experience counting their pulse with staff help and individually.   Heart Attack, Angina, and Risk Factor Modification:  -Group instruction provided by verbal instruction, video, and written materials to support subject.  Instructors address signs and symptoms of angina  and heart attacks.    Also discuss risk factors for heart disease and how to make changes to improve heart health risk factors.   Functional Fitness:  -Group instruction provided by verbal instruction, demonstration, patient participation, and written materials to support subject.  Instructors address safety measures for doing things around the house.  Discuss how to get up and down off the floor, how to pick things up properly, how to safely get out of a chair without assistance, and balance training.   Meditation and Mindfulness:  -Group instruction provided by verbal instruction, patient participation, and written materials to support subject.  Instructor addresses importance of mindfulness and meditation practice to help reduce stress and improve awareness.  Instructor also leads participants through a meditation exercise.    Stretching for Flexibility and Mobility:  -Group instruction  provided by verbal instruction, patient participation, and written materials to support subject.  Instructors lead participants through series of stretches that are designed to increase flexibility thus improving mobility.  These stretches are additional exercise for major muscle groups that are typically performed during regular warm up and cool down.   Hands Only CPR:  -Group verbal, video, and participation provides a basic overview of AHA guidelines for community CPR. Role-play of emergencies allow participants the opportunity to practice calling for help and chest compression technique with discussion of AED use.   Hypertension: -Group verbal and written instruction that provides a basic overview of hypertension including the most recent diagnostic guidelines, risk factor reduction with self-care instructions and medication management. Flowsheet Row CARDIAC REHAB PHASE II EXERCISE from 09/07/2018 in [place]  Date  08/05/18  Instruction Review Code  2- Demonstrated Understanding       Nutrition I class: Heart Healthy Eating:  -Group instruction provided by PowerPoint slides, verbal discussion, and written materials to support subject matter. The instructor gives an explanation and review of the Therapeutic Lifestyle Changes diet recommendations, which includes a discussion on lipid goals, dietary fat, sodium, fiber, plant stanol/sterol esters, sugar, and the components of a well-balanced, healthy diet.   Nutrition II class: Lifestyle Skills:  -Group instruction provided by PowerPoint slides, verbal discussion, and written materials to support subject matter. The instructor gives an explanation and review of label reading, grocery shopping for heart health, heart healthy recipe modifications, and ways to make healthier choices when eating out.   Diabetes Question & Answer:  -Group instruction provided by PowerPoint slides, verbal discussion, and written  materials to support subject matter. The instructor gives an explanation and review of diabetes co-morbidities, pre- and post-prandial blood glucose goals, pre-exercise blood glucose goals, signs, symptoms, and treatment of hypoglycemia and hyperglycemia, and foot care basics.   Diabetes Blitz:  -Group instruction provided by PowerPoint slides, verbal discussion, and written materials to support subject matter. The instructor gives an explanation and review of the physiology behind type 1 and type 2 diabetes, diabetes medications and rational behind using different medications, pre- and post-prandial blood glucose recommendations and Hemoglobin A1c goals, diabetes diet, and exercise including blood glucose guidelines for exercising safely.    Portion Distortion:  -Group instruction provided by PowerPoint slides, verbal discussion, written materials, and food models to support subject matter. The instructor gives an explanation of serving size versus portion size, changes in portions sizes over the last 20 years, and what consists of a serving from each food group.   Stress Management:  -Group instruction provided by verbal instruction, video, and written materials to support subject  matter.  Instructors review role of stress in heart disease and how to cope with stress positively.     Exercising on Your Own:  -Group instruction provided by verbal instruction, power point, and written materials to support subject.  Instructors discuss benefits of exercise, components of exercise, frequency and intensity of exercise, and end points for exercise.  Also discuss use of nitroglycerin and activating EMS.  Review options of places to exercise outside of rehab.  Review guidelines for sex with heart disease.   Cardiac Drugs I:  -Group instruction provided by verbal instruction and written materials to support subject.  Instructor reviews cardiac drug classes: antiplatelets, anticoagulants, beta blockers,  and statins.  Instructor discusses reasons, side effects, and lifestyle considerations for each drug class.   Cardiac Drugs II:  -Group instruction provided by verbal instruction and written materials to support subject.  Instructor reviews cardiac drug classes: angiotensin converting enzyme inhibitors (ACE-I), angiotensin II receptor blockers (ARBs), nitrates, and calcium channel blockers.  Instructor discusses reasons, side effects, and lifestyle considerations for each drug class.   Anatomy and Physiology of the Circulatory System:  Group verbal and written instruction and models provide basic cardiac anatomy and physiology, with the coronary electrical and arterial systems. Review of: AMI, Angina, Valve disease, Heart Failure, Peripheral Artery Disease, Cardiac Arrhythmia, Pacemakers, and the ICD. Flowsheet Row CARDIAC REHAB PHASE II EXERCISE from 09/07/2018 in [place]  Date  09/07/18  Instruction Review Code  2- Demonstrated Understanding      Other Education:  -Group or individual verbal, written, or video instructions that support the educational goals of the cardiac rehab program.   Holiday Eating Survival Tips:  -Group instruction provided by PowerPoint slides, verbal discussion, and written materials to support subject matter. The instructor gives patients tips, tricks, and techniques to help them not only survive but enjoy the holidays despite the onslaught of food that accompanies the holidays.   Knowledge Questionnaire Score: Knowledge Questionnaire Score - 07/12/18 0907    Knowledge Questionnaire Score          Pre Score  17/24           Core Components/Risk Factors/Patient Goals at Admission: Personal Goals and Risk Factors at Admission - 07/12/18 1045    Core Components/Risk Factors/Patient Goals on Admission           Weight Management  Yes;Obesity;Weight Maintenance;Weight Loss    Intervention  Weight Management: Develop a  combined nutrition and exercise program designed to reach desired caloric intake, while maintaining appropriate intake of nutrient and fiber, sodium and fats, and appropriate energy expenditure required for the weight goal.;Weight Management: Provide education and appropriate resources to help participant work on and attain dietary goals.;Weight Management/Obesity: Establish reasonable short term and long term weight goals.;Obesity: Provide education and appropriate resources to help participant work on and attain dietary goals.    Admit Weight  189 lb 13.1 oz (86.1 kg)    Expected Outcomes  Short Term: Continue to assess and modify interventions until short term weight is achieved;Long Term: Adherence to nutrition and physical activity/exercise program aimed toward attainment of established weight goal;Weight Maintenance: Understanding of the daily nutrition guidelines, which includes 25-35% calories from fat, 7% or less cal from saturated fats, less than 226m cholesterol, less than 1.5gm of sodium, & 5 or more servings of fruits and vegetables daily;Weight Loss: Understanding of general recommendations for a balanced deficit meal plan, which promotes 1-2 lb weight loss per week and includes  a negative energy balance of [phone] kcal/d;Understanding recommendations for meals to include 15-35% energy as protein, 25-35% energy from fat, 35-60% energy from carbohydrates, less than 255m of dietary cholesterol, 20-35 gm of total fiber daily;Understanding of distribution of calorie intake throughout the day with the consumption of 4-5 meals/snacks    Hypertension  Yes    Intervention  Provide education on lifestyle modifcations including regular physical activity/exercise, weight management, moderate sodium restriction and increased consumption of fresh fruit, vegetables, and low fat dairy, alcohol moderation, and smoking cessation.;Monitor prescription use compliance.    Expected Outcomes  Long Term: Maintenance  of blood pressure at goal levels.;Short Term: Continued assessment and intervention until BP is < 140/961mHG in hypertensive participants. < 130/8033mG in hypertensive participants with diabetes, heart failure or chronic kidney disease.    Lipids  Yes    Intervention  Provide education and support for participant on nutrition & aerobic/resistive exercise along with prescribed medications to achieve LDL <49m74mDL >40mg17m Expected Outcomes  Short Term: Participant states understanding of desired cholesterol values and is compliant with medications prescribed. Participant is following exercise prescription and nutrition guidelines.;Long Term: Cholesterol controlled with medications as prescribed, with individualized exercise RX and with personalized nutrition plan. Value goals: LDL < 49mg,20m > 40 mg.    Stress  Yes    Intervention  Offer individual and/or small group education and counseling on adjustment to heart disease, stress management and health-related lifestyle change. Teach and support self-help strategies.;Refer participants experiencing significant psychosocial distress to appropriate mental health specialists for further evaluation and treatment. When possible, include family members and significant others in education/counseling sessions.    Expected Outcomes  Short Term: Participant demonstrates changes in health-related behavior, relaxation and other stress management skills, ability to obtain effective social support, and compliance with psychotropic medications if prescribed.;Long Term: Emotional wellbeing is indicated by absence of clinically significant psychosocial distress or social isolation.           Core Components/Risk Factors/Patient Goals Review:  Goals and Risk Factor Review    Core Components/Risk Factors/Patient Goals Review    Row Name 07/22/18 1435 08/05/18 0742 09/08/18 0805   Personal Goals Review  Weight Management/Obesity;Hypertension;Lipids;Stress  Weight  Management/Obesity;Hypertension;Lipids;Stress  Weight Management/Obesity;Hypertension;Lipids;Stress   Review  pt with multiple CAD RF demonstrates eagerness to participate in CR program. pt personal goals are to increase pace of activities with increased strength/stamina, resume normal activities and lose weight.    pt with multiple CAD RF demonstrates eagerness to participate in CR program. pt personal goals are to increase pace of activities with increased strength/stamina, resume normal activities and lose weight.  pt pleased to be adjusting to her new schedule.  pt is not participating in HEP.   pt with multiple CAD RF demonstrates eagerness to participate in CR program. pt personal goals are to increase pace of activities with increased strength/stamina, resume normal activities and lose weight.  pt feels she is physically improving as she is tolerating increased workloads.  Scheduled exercise helps.  pt is not participating in HEP.    Expected Outcomes  pt will participate in CR exercise, nutrition and lifestyle modification opportunities to decrease overall RF.    pt will participate in CR exercise, nutrition and lifestyle modification opportunities to decrease overall RF.    pt will participate in CR exercise, nutrition and lifestyle modification opportunities to decrease overall RF.            Core Components/Risk Factors/Patient Goals  at Discharge (Final Review):  Goals and Risk Factor Review - 09/08/18 0805    Core Components/Risk Factors/Patient Goals Review          Personal Goals Review  Weight Management/Obesity;Hypertension;Lipids;Stress    Review  pt with multiple CAD RF demonstrates eagerness to participate in CR program. pt personal goals are to increase pace of activities with increased strength/stamina, resume normal activities and lose weight.  pt feels she is physically improving as she is tolerating increased workloads.  Scheduled exercise helps.  pt is not participating in HEP.      Expected Outcomes  pt will participate in CR exercise, nutrition and lifestyle modification opportunities to decrease overall RF.             ITP Comments: ITP Comments    Row Name 07/12/18 0843 07/22/18 1422 08/10/18 1627 09/08/18 0738   ITP Comments  Dr. Fransico Him, Medical Director   pt started group exercise. pt tolerated light activity without difficulty. pt oriented to exercise equipment and safety routine. pt verbalized understanding.   30 day ITP review. pt demonstrates willingness to participate in CR program.   30 day ITP review. pt demonstrates willingness to participate in CR program.       Comments:

## 2018-09-09 ENCOUNTER — Encounter (HOSPITAL_COMMUNITY)
Admission: RE | Admit: 2018-09-09 | Discharge: 2018-09-09 | Disposition: A | Discharge status: Home / Self Care | Payer: Medicare Other | Source: Ambulatory Visit | Attending: Cardiovascular Disease | Admitting: Cardiovascular Disease

## 2018-09-09 ENCOUNTER — Ambulatory Visit (HOSPITAL_COMMUNITY): Payer: Medicare Other

## 2018-09-09 DIAGNOSIS — Z955 Presence of coronary angioplasty implant and graft: Secondary | ICD-10-CM | POA: Diagnosis not present

## 2018-09-12 ENCOUNTER — Telehealth: Payer: Self-pay

## 2018-09-12 ENCOUNTER — Ambulatory Visit (HOSPITAL_COMMUNITY): Payer: Medicare Other

## 2018-09-12 ENCOUNTER — Encounter (HOSPITAL_COMMUNITY)
Admission: RE | Admit: 2018-09-12 | Discharge: 2018-09-12 | Disposition: A | Discharge status: Home / Self Care | Payer: Medicare Other | Source: Ambulatory Visit | Attending: Cardiovascular Disease | Admitting: Cardiovascular Disease

## 2018-09-12 DIAGNOSIS — Z955 Presence of coronary angioplasty implant and graft: Secondary | ICD-10-CM | POA: Diagnosis not present

## 2018-09-12 NOTE — Telephone Encounter (Signed)
   [facility] Pre-operative Risk Assessment    Request for surgical clearance:  1. What type of surgery is being performed? Right eye cataract extraction w/intraocular lens implantation followed by Left eye   2. When is this surgery scheduled? 09-23-2018   3. What type of clearance is required (medical clearance vs. Pharmacy clearance to hold med vs. Both)? MEDICAL  4. Are there any medications that need to be held prior to surgery and how long? NONE  5. Practice name and name of physician performing surgery? [facility] ATTN:MICCHELLE  6. What is your office phone number [phone]    7.   What is your office fax number [phone]  8.   Anesthesia type (None, local, MAC, general) ? TOPICAL ANESTHESIA W/IV MEDICATION   Waylan Rocher 09/12/2018, 4:28 PM  _________________________________________________________________   (provider comments below)

## 2018-09-13 NOTE — Telephone Encounter (Signed)
Patient is on aspirin and plavix after the stent placement in Oct 2019, please check with surgeon as long as she does not need to come off the aspirin and plavix, she is cleared since eye surgery is considered a low risk surgery.

## 2018-09-14 ENCOUNTER — Ambulatory Visit (HOSPITAL_COMMUNITY): Payer: Medicare Other

## 2018-09-14 ENCOUNTER — Encounter (HOSPITAL_COMMUNITY)
Admission: RE | Admit: 2018-09-14 | Discharge: 2018-09-14 | Disposition: A | Discharge status: Home / Self Care | Payer: Medicare Other | Source: Ambulatory Visit | Attending: Cardiovascular Disease | Admitting: Cardiovascular Disease

## 2018-09-14 DIAGNOSIS — Z955 Presence of coronary angioplasty implant and graft: Secondary | ICD-10-CM | POA: Diagnosis not present

## 2018-09-14 NOTE — Telephone Encounter (Signed)
Patient does not need to hold ASA and Plavix. Note routed to [facility] via [place].

## 2018-09-16 ENCOUNTER — Ambulatory Visit (HOSPITAL_COMMUNITY): Payer: Medicare Other

## 2018-09-16 ENCOUNTER — Encounter (HOSPITAL_COMMUNITY)
Admission: RE | Admit: 2018-09-16 | Discharge: 2018-09-16 | Disposition: A | Discharge status: Home / Self Care | Payer: Medicare Other | Source: Ambulatory Visit | Attending: Cardiovascular Disease | Admitting: Cardiovascular Disease

## 2018-09-16 DIAGNOSIS — Z955 Presence of coronary angioplasty implant and graft: Secondary | ICD-10-CM

## 2018-09-19 ENCOUNTER — Ambulatory Visit (HOSPITAL_COMMUNITY): Payer: Medicare Other

## 2018-09-19 ENCOUNTER — Encounter (HOSPITAL_COMMUNITY)
Admission: RE | Admit: 2018-09-19 | Discharge: 2018-09-19 | Disposition: A | Discharge status: Home / Self Care | Payer: Medicare Other | Source: Ambulatory Visit | Attending: Cardiovascular Disease | Admitting: Cardiovascular Disease

## 2018-09-19 DIAGNOSIS — Z955 Presence of coronary angioplasty implant and graft: Secondary | ICD-10-CM

## 2018-09-21 ENCOUNTER — Ambulatory Visit (HOSPITAL_COMMUNITY): Payer: Medicare Other

## 2018-09-21 ENCOUNTER — Encounter (HOSPITAL_COMMUNITY)
Admission: RE | Admit: 2018-09-21 | Discharge: 2018-09-21 | Disposition: A | Discharge status: Home / Self Care | Payer: Medicare Other | Source: Ambulatory Visit | Attending: Cardiovascular Disease | Admitting: Cardiovascular Disease

## 2018-09-21 DIAGNOSIS — Z955 Presence of coronary angioplasty implant and graft: Secondary | ICD-10-CM | POA: Diagnosis not present

## 2018-09-23 ENCOUNTER — Ambulatory Visit (HOSPITAL_COMMUNITY): Payer: Medicare Other

## 2018-09-23 ENCOUNTER — Encounter (HOSPITAL_COMMUNITY): Payer: Medicare Other

## 2018-09-26 ENCOUNTER — Ambulatory Visit (HOSPITAL_COMMUNITY): Payer: Medicare Other

## 2018-09-26 ENCOUNTER — Encounter (HOSPITAL_COMMUNITY): Payer: Medicare Other

## 2018-09-28 ENCOUNTER — Encounter (HOSPITAL_COMMUNITY): Payer: Medicare Other

## 2018-09-28 ENCOUNTER — Ambulatory Visit (HOSPITAL_COMMUNITY): Payer: Medicare Other

## 2018-09-30 ENCOUNTER — Ambulatory Visit (HOSPITAL_COMMUNITY): Payer: Medicare Other

## 2018-09-30 ENCOUNTER — Encounter (HOSPITAL_COMMUNITY)
Admission: RE | Admit: 2018-09-30 | Discharge: 2018-09-30 | Disposition: A | Discharge status: Home / Self Care | Payer: Medicare Other | Source: Ambulatory Visit | Attending: Cardiovascular Disease | Admitting: Cardiovascular Disease

## 2018-09-30 DIAGNOSIS — Z955 Presence of coronary angioplasty implant and graft: Secondary | ICD-10-CM

## 2018-10-03 ENCOUNTER — Encounter (HOSPITAL_COMMUNITY)
Admission: RE | Admit: 2018-10-03 | Discharge: 2018-10-03 | Disposition: A | Discharge status: Home / Self Care | Payer: Medicare Other | Source: Ambulatory Visit | Attending: Cardiovascular Disease | Admitting: Cardiovascular Disease

## 2018-10-03 ENCOUNTER — Ambulatory Visit (HOSPITAL_COMMUNITY): Payer: Medicare Other

## 2018-10-03 DIAGNOSIS — R079 Chest pain, unspecified: Secondary | ICD-10-CM | POA: Diagnosis present

## 2018-10-03 DIAGNOSIS — Z886 Allergy status to analgesic agent status: Secondary | ICD-10-CM | POA: Insufficient documentation

## 2018-10-03 DIAGNOSIS — I10 Essential (primary) hypertension: Secondary | ICD-10-CM | POA: Diagnosis not present

## 2018-10-03 DIAGNOSIS — I2510 Atherosclerotic heart disease of native coronary artery without angina pectoris: Secondary | ICD-10-CM | POA: Diagnosis not present

## 2018-10-03 DIAGNOSIS — E785 Hyperlipidemia, unspecified: Secondary | ICD-10-CM | POA: Insufficient documentation

## 2018-10-03 DIAGNOSIS — Z888 Allergy status to other drugs, medicaments and biological substances status: Secondary | ICD-10-CM | POA: Diagnosis not present

## 2018-10-03 DIAGNOSIS — R9431 Abnormal electrocardiogram [ECG] [EKG]: Secondary | ICD-10-CM | POA: Insufficient documentation

## 2018-10-03 DIAGNOSIS — Z955 Presence of coronary angioplasty implant and graft: Secondary | ICD-10-CM | POA: Diagnosis not present

## 2018-10-05 ENCOUNTER — Ambulatory Visit (HOSPITAL_COMMUNITY): Payer: Medicare Other

## 2018-10-05 ENCOUNTER — Encounter (HOSPITAL_COMMUNITY)
Admission: RE | Admit: 2018-10-05 | Discharge: 2018-10-05 | Disposition: A | Discharge status: Home / Self Care | Payer: Medicare Other | Source: Ambulatory Visit | Attending: Cardiovascular Disease | Admitting: Cardiovascular Disease

## 2018-10-05 DIAGNOSIS — Z955 Presence of coronary angioplasty implant and graft: Secondary | ICD-10-CM

## 2018-10-05 NOTE — Progress Notes (Signed)
Cardiac Individual Treatment Plan  Patient Details  Name: Monique [name] MRN: [id number] Date of Birth: 1941/03/31 Referring Provider:   Flowsheet Row CARDIAC REHAB PHASE II ORIENTATION from 07/12/2018 in [place]  Referring Provider  Dr. Gwenlyn Found      Initial Encounter Date:  Flowsheet Row CARDIAC REHAB PHASE II ORIENTATION from 07/12/2018 in [place]  Date  07/12/18      Visit Diagnosis: 05/30/2018 Stented coronary artery  Patient's Home Medications on Admission:  Current Outpatient Medications:  .  aspirin EC 81 MG tablet, Take 81 mg by mouth daily., Disp: , Rfl:  .  atorvastatin (LIPITOR) 80 MG tablet, Take 1 tablet (80 mg total) by mouth daily at 6 PM., Disp: 90 tablet, Rfl: 2 .  carvedilol (COREG) 3.125 MG tablet, TAKE 1 TABLET BY MOUTH TWICE DAILY WITH A MEAL, Disp: 60 tablet, Rfl: 11 .  cetirizine (ZYRTEC) 10 MG tablet, Take 10 mg by mouth at bedtime. , Disp: , Rfl:  .  Cholecalciferol (VITAMIN D PO), Take 1 tablet by mouth daily., Disp: , Rfl:  .  clopidogrel (PLAVIX) 75 MG tablet, Take 1 tablet (75 mg total) by mouth daily with breakfast., Disp: 90 tablet, Rfl: 2 .  ferrous sulfate (FERROUSUL) 325 (65 FE) MG tablet, Take 1 tablet (325 mg total) by mouth 2 (two) times daily with a meal., Disp: 180 tablet, Rfl: 2 .  isosorbide mononitrate (IMDUR) 30 MG 24 hr tablet, Take 0.5 tablets (15 mg total) by mouth daily., Disp: 90 tablet, Rfl: 1 .  lisinopril (PRINIVIL,ZESTRIL) 40 MG tablet, Take 40 mg by mouth daily., Disp: , Rfl:  .  magnesium oxide (MAG-OX) 400 MG tablet, Take 400 mg by mouth daily., Disp: , Rfl:  .  nitroGLYCERIN (NITROSTAT) 0.4 MG SL tablet, Place 1 tablet (0.4 mg total) under the tongue every 5 (five) minutes as needed., Disp: 25 tablet, Rfl: 2 .  oxyCODONE-acetaminophen (PERCOCET) 7.5-325 MG tablet, Take 1 tablet by mouth every 4 (four) hours. , Disp: , Rfl:  .  pantoprazole (PROTONIX) 40 MG  tablet, Take 1 tablet (40 mg total) by mouth 2 (two) times daily before a meal., Disp: 30 tablet, Rfl: 0 .  polyethylene glycol (MIRALAX / GLYCOLAX) packet, Take 17 g by mouth 2 (two) times daily. (Patient taking differently: Take 17 g by mouth at bedtime. ), Disp: 14 each, Rfl: 0 .  venlafaxine XR (EFFEXOR-XR) 75 MG 24 hr capsule, Take 225 mg by mouth at bedtime. , Disp: , Rfl: 0  Past Medical History: Past Medical History:  Diagnosis Date  . Anemia     mild as per PCP  . Arthritis    hip/ s/p recent shoulder fracture 8/12- RIGHT  . Asthma   . Coronary artery disease 05/30/2018   LAD PCI/DES  . GI bleeding   . Hiatal hernia   . Humerus fracture    with wrist on right side  . Hypertension    hypercholesterolemia/  EKG on chart with clearance and note 06/18/11  [facility]  . Pneumonia     Tobacco Use: Social History   Tobacco Use  Smoking Status Never Smoker  Smokeless Tobacco Never Used    Labs: Recent Review Flowsheet Data    Labs for ITP Cardiac and Pulmonary Rehab Latest Ref Rng & Units 08/24/2016 06/01/2018   Cholestrol 0 - 200 mg/dL - 135   LDLCALC 0 - 99 mg/dL - 56   HDL >40 mg/dL - 49  Trlycerides <150 mg/dL - 150(H)   Hemoglobin A1c 4.8 - 5.6 % 5.9(H) -      Capillary Blood Glucose: No results found for: GLUCAP   Exercise Target Goals: Exercise Program Goal: Individual exercise prescription set using results from initial 6 min walk test and THRR while considering  patient's activity barriers and safety.   Exercise Prescription Goal: Initial exercise prescription builds to 30-45 minutes a day of aerobic activity, 2-3 days per week.  Home exercise guidelines will be given to patient during program as part of exercise prescription that the participant will acknowledge.  Activity Barriers & Risk Stratification: Activity Barriers & Cardiac Risk Stratification - 07/12/18 1052    Activity Barriers & Cardiac Risk Stratification          Cardiac Risk  Stratification  High           6 Minute Walk: 6 Minute Walk    6 Minute Walk    Row Name 07/12/18 1038   Phase  Initial   Distance  976 feet   Walk Time  6 minutes   # of Rest Breaks  1   MPH  1.9   METS  1.9   RPE  13   VO2 Peak  6.65   Symptoms  Yes (comment)   Comments  Knee and back pain   Resting HR  98 bpm   Resting BP  108/70   Resting Oxygen Saturation   94 %   Exercise Oxygen Saturation  during 6 min walk  94 %   Max Ex. HR  117 bpm   Max Ex. BP  142/72   2 Minute Post BP  120/70          Oxygen Initial Assessment:   Oxygen Re-Evaluation:   Oxygen Discharge (Final Oxygen Re-Evaluation):   Initial Exercise Prescription: Initial Exercise Prescription - 07/12/18 1000    Date of Initial Exercise RX and Referring Provider          Date  07/12/18    Referring Provider  Dr. Gwenlyn Found    Expected Discharge Date  10/21/18        NuStep          Level  2    SPM  75    Minutes  10    METs  1.8        Arm Ergometer          Level  1    Watts  15    Minutes  10    METs  1.93        Track          Laps  6    Minutes  10    METs  2        Prescription Details          Frequency (times per week)  3    Duration  Progress to 30 minutes of continuous aerobic without signs/symptoms of physical distress        Intensity          THRR 40-80% of Max Heartrate  57-114    Ratings of Perceived Exertion  11-13        Progression          Progression  Continue to progress workloads to maintain intensity without signs/symptoms of physical distress.        Resistance Training          Training Prescription  Yes    Weight  2 lbs.     Reps  10-15           Perform Capillary Blood Glucose checks as needed.  Exercise Prescription Changes: Exercise Prescription Changes    Response to Exercise    Row Name 08/02/18 1500 08/05/18 1400 09/05/18 1500 09/27/18 1400   Blood Pressure (Admit)  132/70  128/84  104/60  102/60   Blood Pressure  (Exercise)  138/78  152/70  144/82  128/62   Blood Pressure (Exit)  120/62  112/70  120/80  100/60   Heart Rate (Admit)  99 bpm  99 bpm  98 bpm  98 bpm   Heart Rate (Exercise)  119 bpm  116 bpm  122 bpm  114 bpm   Heart Rate (Exit)  97 bpm  87 bpm  86 bpm  83 bpm   Rating of Perceived Exertion (Exercise)  '13  12  13  12   ' Symptoms  no documentation  no documentation  None  None   Duration  Progress to 30 minutes of  aerobic without signs/symptoms of physical distress  Progress to 30 minutes of  aerobic without signs/symptoms of physical distress  Progress to 30 minutes of  aerobic without signs/symptoms of physical distress  Progress to 30 minutes of  aerobic without signs/symptoms of physical distress   Intensity  THRR unchanged  THRR unchanged  THRR unchanged  THRR unchanged       Progression    Row Name 08/02/18 1500 08/05/18 1400 09/05/18 1500 09/27/18 1400   Progression  Continue to progress workloads to maintain intensity without signs/symptoms of physical distress.  Continue to progress workloads to maintain intensity without signs/symptoms of physical distress.  Continue to progress workloads to maintain intensity without signs/symptoms of physical distress.  Continue to progress workloads to maintain intensity without signs/symptoms of physical distress.   Average METs  2.1  1.9  2.7  2.5       Resistance Training    Row Name 08/02/18 1500 08/05/18 1400 09/05/18 1500 09/27/18 1400   Training Prescription  Yes  Yes  Yes  No   Weight  2 lbs.   3 lbs.  3 lbs.  no documentation   Reps  10-15  10-15  10-15  no documentation   Time  10 Minutes  10 Minutes  10 Minutes  no documentation       Treadmill    Row Name 08/02/18 1500 08/05/18 1400 09/05/18 1500 09/27/18 1400   MPH  no documentation  no documentation  1.9  1.9   Grade  no documentation  no documentation  0  0   Minutes  no documentation  no documentation  10  10   METs  no documentation  no documentation  2.38  2.45        NuStep    Row Name 08/02/18 1500 08/05/18 1400 09/05/18 1500 09/27/18 1400   Level  '3  3  3  3   ' SPM  75  75  75  75   Minutes  '10  10  10  10   ' METs  1.8  1.8  3.6  2.3       Track    Row Name 08/02/18 1500 08/05/18 1400 09/05/18 1500 09/27/18 1400   Laps  '10  8  10  10   ' Minutes  '10  10  10  10   ' METs  2.76  2.05  2.05  2.74  [place] Name 08/02/18 1500 08/05/18 1400 09/05/18 1500 09/27/18 1400   Plans to continue exercise at  no documentation  Home (comment)  Home (comment)  Home (comment)   Frequency  no documentation  Add 1 additional day to program exercise sessions.  Add 1 additional day to program exercise sessions.  Add 1 additional day to program exercise sessions.   Initial Home Exercises Provided  no documentation  08/05/18  08/05/18  08/05/18          Exercise Comments: Exercise Comments    Row Name 08/02/18 1533 08/05/18 1456 09/08/18 1054 10/04/18 1122   Exercise Comments  Reviewed METs and goals with Pt.   Reviewed HEP with Pt. Encourgaed Pt to exercise at home.   Reviewed METs and goals with Pt. Pt is tolerating exercise well.   Reviewed METs and goals with Pt. Pt is tolerating exercise well.       Exercise Goals and Review: Exercise Goals    Exercise Goals    Row Name 07/12/18 1041   Increase Physical Activity  Yes   Intervention  Provide advice, education, support and counseling about physical activity/exercise needs.;Develop an individualized exercise prescription for aerobic and resistive training based on initial evaluation findings, risk stratification, comorbidities and participant's personal goals.   Expected Outcomes  Short Term: Attend rehab on a regular basis to increase amount of physical activity.   Increase Strength and Stamina  Yes   Intervention  Provide advice, education, support and counseling about physical activity/exercise needs.;Develop an individualized exercise prescription for aerobic and resistive training based  on initial evaluation findings, risk stratification, comorbidities and participant's personal goals.   Expected Outcomes  Short Term: Increase workloads from initial exercise prescription for resistance, speed, and METs.   Able to understand and use rate of perceived exertion (RPE) scale  Yes   Intervention  Provide education and explanation on how to use RPE scale   Expected Outcomes  Short Term: Able to use RPE daily in rehab to express subjective intensity level;Long Term:  Able to use RPE to guide intensity level when exercising independently   Knowledge and understanding of Target Heart Rate Range (THRR)  Yes   Intervention  Provide education and explanation of THRR including how the numbers were predicted and where they are located for reference   Expected Outcomes  Short Term: Able to state/look up THRR;Long Term: Able to use THRR to govern intensity when exercising independently;Short Term: Able to use daily as guideline for intensity in rehab   Able to check pulse independently  Yes   Intervention  Provide education and demonstration on how to check pulse in carotid and radial arteries.;Review the importance of being able to check your own pulse for safety during independent exercise   Expected Outcomes  Short Term: Able to explain why pulse checking is important during independent exercise;Long Term: Able to check pulse independently and accurately   Understanding of Exercise Prescription  Yes   Intervention  Provide education, explanation, and written materials on patient's individual exercise prescription   Expected Outcomes  Short Term: Able to explain program exercise prescription;Long Term: Able to explain home exercise prescription to exercise independently          Exercise Goals Re-Evaluation : Exercise Goals Re-Evaluation    Exercise Goal Re-Evaluation    Row Name 08/02/18 1532 08/05/18 1454 09/08/18 1046 10/04/18 1121   Exercise Goals Review  Increase Physical  Activity;Increase Strength and Stamina;Able to check  pulse independently;Understanding of Exercise Prescription;Knowledge and understanding of Target Heart Rate Range (THRR);Able to understand and use rate of perceived exertion (RPE) scale  Increase Physical Activity;Increase Strength and Stamina;Able to check pulse independently;Understanding of Exercise Prescription;Knowledge and understanding of Target Heart Rate Range (THRR);Able to understand and use rate of perceived exertion (RPE) scale  Increase Physical Activity;Increase Strength and Stamina;Able to understand and use rate of perceived exertion (RPE) scale;Knowledge and understanding of Target Heart Rate Range (THRR);Understanding of Exercise Prescription;Able to check pulse independently  Increase Physical Activity;Increase Strength and Stamina;Able to understand and use rate of perceived exertion (RPE) scale;Knowledge and understanding of Target Heart Rate Range (THRR);Understanding of Exercise Prescription;Able to check pulse independently   Comments  Reviewed METs and goals with Pt. MET level is 2.1. Pt has some pain with exercise due to arthritis and deconditioning.   Reviewed HEP with Pt. Pt is not currently exercising at home. Encouraged Pt to walk 2-4 days for 30 minutes in addition to cardiac rehab.   Reviewed METs and goals with Pt. Pt has a MET level of 2.7 and continues to increase resistance and workloads gradually. Pt is not exercising at home currently. Encouraged Pt to walk 2-3 days per week for 30 minutes in addition to the program.   Reviewed METs and goals with Pt. Pt has a MET level of 2.5. Pt is walking at home 30 minutes a day 2-4 days per week.    Expected Outcomes  Will continue to monitor and progress Pt as tolerated.   Will continue to monitor and progress Pt as tolerated.   Will continue to monitor and progress Pt as tolerated.   Will continue to monitor and progress Pt as tolerated.           Discharge Exercise  Prescription (Final Exercise Prescription Changes): Exercise Prescription Changes - 09/27/18 1400    Response to Exercise          Blood Pressure (Admit)  102/60    Blood Pressure (Exercise)  128/62    Blood Pressure (Exit)  100/60    Heart Rate (Admit)  98 bpm    Heart Rate (Exercise)  114 bpm    Heart Rate (Exit)  83 bpm    Rating of Perceived Exertion (Exercise)  12    Symptoms  None    Duration  Progress to 30 minutes of  aerobic without signs/symptoms of physical distress    Intensity  THRR unchanged        Progression          Progression  Continue to progress workloads to maintain intensity without signs/symptoms of physical distress.    Average METs  2.5        Resistance Training          Training Prescription  No        Treadmill          MPH  1.9    Grade  0    Minutes  10    METs  2.45        NuStep          Level  3    SPM  75    Minutes  10    METs  2.3        Track          Laps  10    Minutes  10    METs  2.74        Home Exercise Plan  Plans to continue exercise at  Home (comment)    Frequency  Add 1 additional day to program exercise sessions.    Initial Home Exercises Provided  08/05/18           Nutrition:  Target Goals: Understanding of nutrition guidelines, daily intake of sodium <1559m, cholesterol <2012m calories 30% from fat and 7% or less from saturated fats, daily to have 5 or more servings of fruits and vegetables.  Biometrics: Pre Biometrics - 07/12/18 1041    Pre Biometrics          Height  '5\' 5"'  (1.651 m)    Weight  86.1 kg    Waist Circumference  40 inches    Hip Circumference  43 inches    Waist to Hip Ratio  0.93 %    BMI (Calculated)  31.59    Triceps Skinfold  40 mm    % Body Fat  45.3 %    Grip Strength  18 kg    Flexibility  0 in    Single Leg Stand  2.97 seconds            Nutrition Therapy Plan and Nutrition Goals: Nutrition Therapy & Goals - 07/12/18 1026    Nutrition Therapy           Diet  heart healthy, diabetic        Personal Nutrition Goals          Nutrition Goal  Pt to identify and limit food sources of saturated fat, trans fat, refined carbohydrates and sodium    Personal Goal #2  Pt able to name foods that affect blood glucose.    Personal Goal #3  Pt to explore batch cooking, meal prepping, and new recipes     Personal Goal #4  Pt to identify food quantities necessary to achieve weight loss of 6-15 lbs. at graduation from cardiac rehab.        Intervention Plan          Intervention  Prescribe, educate and counsel regarding individualized specific dietary modifications aiming towards targeted core components such as weight, hypertension, lipid management, diabetes, heart failure and other comorbidities.    Expected Outcomes  Short Term Goal: Understand basic principles of dietary content, such as calories, fat, sodium, cholesterol and nutrients.;Long Term Goal: Adherence to prescribed nutrition plan.           Nutrition Assessments: Nutrition Assessments - 07/12/18 1027    MEDFICTS Scores          Pre Score  13           Nutrition Goals Re-Evaluation:   Nutrition Goals Re-Evaluation:   Nutrition Goals Discharge (Final Nutrition Goals Re-Evaluation):   Psychosocial: Target Goals: Acknowledge presence or absence of significant depression and/or stress, maximize coping skills, provide positive support system. Participant is able to verbalize types and ability to use techniques and skills needed for reducing stress and depression.  Initial Review & Psychosocial Screening: Initial Psych Review & Screening - 07/12/18 1158    Initial Review          Current issues with  Current Depression        Family Dynamics          Good Support System?  Yes   family and hospice friends   Concerns  Recent loss of significant other    Comments  husband passed away         Barriers  Psychosocial barriers to participate in program  The  patient should benefit from training in stress management and relaxation.        Screening Interventions          Interventions  Encouraged to exercise;To provide support and resources with identified psychosocial needs;Provide feedback about the scores to participant    Expected Outcomes  Short Term goal: Utilizing psychosocial counselor, staff and physician to assist with identification of specific Stressors or current issues interfering with healing process. Setting desired goal for each stressor or current issue identified.;Long Term Goal: Stressors or current issues are controlled or eliminated.;Short Term goal: Identification and review with participant of any Quality of Life or Depression concerns found by scoring the questionnaire.;Long Term goal: The participant improves quality of Life and PHQ9 Scores as seen by post scores and/or verbalization of changes           Quality of Life Scores: Quality of Life - 08/08/18 1411    Quality of Life Scores          Health/Function Pre  10.18 %   pt notes improvement in symptoms however still c/o DOE affecting home care ADLs like bedmaking and dishwashing.    Socioeconomic Pre  23.71 %    Psych/Spiritual Pre  12.29 %   pt meets with Hospice group and individual therapy with positive results.   Family Pre  22.5 %    GLOBAL Pre  15.14 %   overall pt still coping with multiple stressors including personal health, family and grief.  pt accepting of offer to meet with Jeanella Craze. LM for his assistant to call to schedule pt.          Scores of 19 and below usually indicate a poorer quality of life in these areas.  A difference of  2-3 points is a clinically meaningful difference.  A difference of 2-3 points in the total score of the Quality of Life Index has been associated with significant improvement in overall quality of life, self-image, physical symptoms, and general health in studies assessing change in quality of life.  PHQ-9: Recent  Review Flowsheet Data    Depression screen [facility] 2/9 07/22/2018   Decreased Interest 2   Down, Depressed, Hopeless 2   PHQ - 2 Score 4   Altered sleeping 1   Tired, decreased energy 2   Change in appetite 0   Feeling bad or failure about yourself  2   Trouble concentrating 0   Moving slowly or fidgety/restless 1   Suicidal thoughts 0   PHQ-9 Score 10   Difficult doing work/chores Very difficult     Interpretation of Total Score  Total Score Depression Severity:  1-4 = Minimal depression, 5-9 = Mild depression, 10-14 = Moderate depression, 15-19 = Moderately severe depression, 20-27 = Severe depression   Psychosocial Evaluation and Intervention: Psychosocial Evaluation - 07/22/18 1424    Psychosocial Evaluation & Interventions          Interventions  Stress management education;Encouraged to exercise with the program and follow exercise prescription;Relaxation education;Physician referral    Comments  pt with known depression and grief pattern demonstrates health related stress and anxiety. pt reports "no joy or motivation" from daily activities. pt does have support systems and coping skills. pt has been advised to contact PCP for medication managements.      Expected Outcomes  pt will exhibit improved outlook and coping skills.     Continue Psychosocial Services   Follow up required  by staff           Psychosocial Re-Evaluation: Psychosocial Re-Evaluation    Psychosocial Re-Evaluation    Row Name 07/25/18 1422 08/05/18 0741 08/15/18 1630 09/08/18 0738 10/03/18 1429   Current issues with  Current Stress Concerns;History of Depression;Current Anxiety/Panic;Current Depression  Current Stress Concerns;History of Depression;Current Anxiety/Panic;Current Depression  Current Stress Concerns;History of Depression;Current Anxiety/Panic;Current Depression  Current Stress Concerns;History of Depression;Current Anxiety/Panic;Current Depression  Current Stress Concerns;History of  Depression;Current Anxiety/Panic;Current Depression   Comments  pt arrived at cardiac rehab tearful.  pt grieving today.  pt states the holidays have been overwhelming for her as this is the first year since her husbands death she has decorated and celebrated.  anniversary of husbands death is in a few weeks.    pt has supportive friends and family.    pt arrived at cardiac rehab tearful.  pt grieving today.  pt states the holidays have been overwhelming for her as this is the first year since her husbands death she has decorated and celebrated.  anniversary of husbands death is in a few weeks.    pt has supportive friends and family.  pt appears more upbeat and positive post holidays.   appt scheduled with Jeanella Craze 08/19/2018 @ 2:30.  written and verbal information given. pt verbalized understanding.    pt demonstrates improved outlook and coping skills since meeting with Jeanella Craze. pt demonstrates improved self confidence with exercise and rehab participation. pt seeking positive relationships with peers at [facility].  pt demonstrates improved outlook and coping skills since meeting with Jeanella Craze. pt demonstrates improved self confidence with exercise and rehab participation. pt seeking positive relationships with peers at [facility].   Expected Outcomes  pt will exhibit normal grief pattern, improved outlook and coping skills.   pt will exhibit normal grief pattern, improved outlook and coping skills.   pt will exhibit normal grief pattern, improved outlook and coping skills.   pt will exhibit normal grief pattern, improved outlook and coping skills.   pt will exhibit normal grief pattern, improved outlook and coping skills.    Interventions  Encouraged to attend Cardiac Rehabilitation for the exercise  Encouraged to attend Cardiac Rehabilitation for the exercise  Encouraged to attend Cardiac Rehabilitation for the exercise  Encouraged to attend Cardiac Rehabilitation for the exercise  Encouraged to attend Cardiac  Rehabilitation for the exercise   Continue Psychosocial Services   No Follow up required  No Follow up required  no documentation  No Follow up required  No Follow up required       Initial Review    Row Name 07/25/18 1422 08/05/18 0741 08/15/18 1630 09/08/18 0738 10/03/18 1429   Source of Stress Concerns  Chronic Illness;Family grief   no documentation  no documentation  Chronic Illness;Family  Chronic Illness;Family          Psychosocial Discharge (Final Psychosocial Re-Evaluation): Psychosocial Re-Evaluation - 10/03/18 1429    Psychosocial Re-Evaluation          Current issues with  Current Stress Concerns;History of Depression;Current Anxiety/Panic;Current Depression    Comments  pt demonstrates improved outlook and coping skills since meeting with Jeanella Craze. pt demonstrates improved self confidence with exercise and rehab participation. pt seeking positive relationships with peers at [facility].    Expected Outcomes  pt will exhibit normal grief pattern, improved outlook and coping skills.     Interventions  Encouraged to attend Cardiac Rehabilitation for the exercise    Continue Psychosocial Services  No Follow up required        Initial Review          Source of Stress Concerns  Chronic Illness;Family           Vocational Rehabilitation: Provide vocational rehab assistance to qualifying candidates.   Vocational Rehab Evaluation & Intervention: Vocational Rehab - 07/12/18 1206    Initial Vocational Rehab Evaluation & Intervention          Assessment shows need for Vocational Rehabilitation  No           Education: Education Goals: Education classes will be provided on a weekly basis, covering required topics. Participant will state understanding/return demonstration of topics presented.  Learning Barriers/Preferences: Learning Barriers/Preferences - 07/12/18 1043    Learning Barriers/Preferences          Learning Barriers  Sight    Learning Preferences  Skilled  Demonstration;Pictoral;Video           Education Topics: Count Your Pulse:  -Group instruction provided by verbal instruction, demonstration, patient participation and written materials to support subject.  Instructors address importance of being able to find your pulse and how to count your pulse when at home without a heart monitor.  Patients get hands on experience counting their pulse with staff help and individually.   Heart Attack, Angina, and Risk Factor Modification:  -Group instruction provided by verbal instruction, video, and written materials to support subject.  Instructors address signs and symptoms of angina and heart attacks.    Also discuss risk factors for heart disease and how to make changes to improve heart health risk factors.   Functional Fitness:  -Group instruction provided by verbal instruction, demonstration, patient participation, and written materials to support subject.  Instructors address safety measures for doing things around the house.  Discuss how to get up and down off the floor, how to pick things up properly, how to safely get out of a chair without assistance, and balance training.   Meditation and Mindfulness:  -Group instruction provided by verbal instruction, patient participation, and written materials to support subject.  Instructor addresses importance of mindfulness and meditation practice to help reduce stress and improve awareness.  Instructor also leads participants through a meditation exercise.    Stretching for Flexibility and Mobility:  -Group instruction provided by verbal instruction, patient participation, and written materials to support subject.  Instructors lead participants through series of stretches that are designed to increase flexibility thus improving mobility.  These stretches are additional exercise for major muscle groups that are typically performed during regular warm up and cool down.   Hands Only CPR:  -Group  verbal, video, and participation provides a basic overview of AHA guidelines for community CPR. Role-play of emergencies allow participants the opportunity to practice calling for help and chest compression technique with discussion of AED use.   Hypertension: -Group verbal and written instruction that provides a basic overview of hypertension including the most recent diagnostic guidelines, risk factor reduction with self-care instructions and medication management. Flowsheet Row CARDIAC REHAB PHASE II EXERCISE from 09/07/2018 in [facility]  Date  08/05/18  Instruction Review Code  2- Demonstrated Understanding       Nutrition I class: Heart Healthy Eating:  -Group instruction provided by PowerPoint slides, verbal discussion, and written materials to support subject matter. The instructor gives an explanation and review of the Therapeutic Lifestyle Changes diet recommendations, which includes a discussion on lipid goals, dietary fat, sodium, fiber, plant  stanol/sterol esters, sugar, and the components of a well-balanced, healthy diet.   Nutrition II class: Lifestyle Skills:  -Group instruction provided by PowerPoint slides, verbal discussion, and written materials to support subject matter. The instructor gives an explanation and review of label reading, grocery shopping for heart health, heart healthy recipe modifications, and ways to make healthier choices when eating out.   Diabetes Question & Answer:  -Group instruction provided by PowerPoint slides, verbal discussion, and written materials to support subject matter. The instructor gives an explanation and review of diabetes co-morbidities, pre- and post-prandial blood glucose goals, pre-exercise blood glucose goals, signs, symptoms, and treatment of hypoglycemia and hyperglycemia, and foot care basics.   Diabetes Blitz:  -Group instruction provided by PowerPoint slides, verbal discussion, and written  materials to support subject matter. The instructor gives an explanation and review of the physiology behind type 1 and type 2 diabetes, diabetes medications and rational behind using different medications, pre- and post-prandial blood glucose recommendations and Hemoglobin A1c goals, diabetes diet, and exercise including blood glucose guidelines for exercising safely.    Portion Distortion:  -Group instruction provided by PowerPoint slides, verbal discussion, written materials, and food models to support subject matter. The instructor gives an explanation of serving size versus portion size, changes in portions sizes over the last 20 years, and what consists of a serving from each food group.   Stress Management:  -Group instruction provided by verbal instruction, video, and written materials to support subject matter.  Instructors review role of stress in heart disease and how to cope with stress positively.     Exercising on Your Own:  -Group instruction provided by verbal instruction, power point, and written materials to support subject.  Instructors discuss benefits of exercise, components of exercise, frequency and intensity of exercise, and end points for exercise.  Also discuss use of nitroglycerin and activating EMS.  Review options of places to exercise outside of rehab.  Review guidelines for sex with heart disease.   Cardiac Drugs I:  -Group instruction provided by verbal instruction and written materials to support subject.  Instructor reviews cardiac drug classes: antiplatelets, anticoagulants, beta blockers, and statins.  Instructor discusses reasons, side effects, and lifestyle considerations for each drug class.   Cardiac Drugs II:  -Group instruction provided by verbal instruction and written materials to support subject.  Instructor reviews cardiac drug classes: angiotensin converting enzyme inhibitors (ACE-I), angiotensin II receptor blockers (ARBs), nitrates, and calcium  channel blockers.  Instructor discusses reasons, side effects, and lifestyle considerations for each drug class.   Anatomy and Physiology of the Circulatory System:  Group verbal and written instruction and models provide basic cardiac anatomy and physiology, with the coronary electrical and arterial systems. Review of: AMI, Angina, Valve disease, Heart Failure, Peripheral Artery Disease, Cardiac Arrhythmia, Pacemakers, and the ICD. Flowsheet Row CARDIAC REHAB PHASE II EXERCISE from 09/07/2018 in [facility]  Date  09/07/18  Instruction Review Code  2- Demonstrated Understanding      Other Education:  -Group or individual verbal, written, or video instructions that support the educational goals of the cardiac rehab program.   Holiday Eating Survival Tips:  -Group instruction provided by PowerPoint slides, verbal discussion, and written materials to support subject matter. The instructor gives patients tips, tricks, and techniques to help them not only survive but enjoy the holidays despite the onslaught of food that accompanies the holidays.   Knowledge Questionnaire Score: Knowledge Questionnaire Score - 07/12/18 6979  Knowledge Questionnaire Score          Pre Score  17/24           Core Components/Risk Factors/Patient Goals at Admission: Personal Goals and Risk Factors at Admission - 07/12/18 1045    Core Components/Risk Factors/Patient Goals on Admission           Weight Management  Yes;Obesity;Weight Maintenance;Weight Loss    Intervention  Weight Management: Develop a combined nutrition and exercise program designed to reach desired caloric intake, while maintaining appropriate intake of nutrient and fiber, sodium and fats, and appropriate energy expenditure required for the weight goal.;Weight Management: Provide education and appropriate resources to help participant work on and attain dietary goals.;Weight Management/Obesity: Establish  reasonable short term and long term weight goals.;Obesity: Provide education and appropriate resources to help participant work on and attain dietary goals.    Admit Weight  189 lb 13.1 oz (86.1 kg)    Expected Outcomes  Short Term: Continue to assess and modify interventions until short term weight is achieved;Long Term: Adherence to nutrition and physical activity/exercise program aimed toward attainment of established weight goal;Weight Maintenance: Understanding of the daily nutrition guidelines, which includes 25-35% calories from fat, 7% or less cal from saturated fats, less than 273m cholesterol, less than 1.5gm of sodium, & 5 or more servings of fruits and vegetables daily;Weight Loss: Understanding of general recommendations for a balanced deficit meal plan, which promotes 1-2 lb weight loss per week and includes a negative energy balance of [id number] kcal/d;Understanding recommendations for meals to include 15-35% energy as protein, 25-35% energy from fat, 35-60% energy from carbohydrates, less than 2070mof dietary cholesterol, 20-35 gm of total fiber daily;Understanding of distribution of calorie intake throughout the day with the consumption of 4-5 meals/snacks    Hypertension  Yes    Intervention  Provide education on lifestyle modifcations including regular physical activity/exercise, weight management, moderate sodium restriction and increased consumption of fresh fruit, vegetables, and low fat dairy, alcohol moderation, and smoking cessation.;Monitor prescription use compliance.    Expected Outcomes  Long Term: Maintenance of blood pressure at goal levels.;Short Term: Continued assessment and intervention until BP is < 140/9011mG in hypertensive participants. < 130/31m97m in hypertensive participants with diabetes, heart failure or chronic kidney disease.    Lipids  Yes    Intervention  Provide education and support for participant on nutrition & aerobic/resistive exercise along with  prescribed medications to achieve LDL <70mg52mL >40mg.45mExpected Outcomes  Short Term: Participant states understanding of desired cholesterol values and is compliant with medications prescribed. Participant is following exercise prescription and nutrition guidelines.;Long Term: Cholesterol controlled with medications as prescribed, with individualized exercise RX and with personalized nutrition plan. Value goals: LDL < 70mg, 15m> 40 mg.    Stress  Yes    Intervention  Offer individual and/or small group education and counseling on adjustment to heart disease, stress management and health-related lifestyle change. Teach and support self-help strategies.;Refer participants experiencing significant psychosocial distress to appropriate mental health specialists for further evaluation and treatment. When possible, include family members and significant others in education/counseling sessions.    Expected Outcomes  Short Term: Participant demonstrates changes in health-related behavior, relaxation and other stress management skills, ability to obtain effective social support, and compliance with psychotropic medications if prescribed.;Long Term: Emotional wellbeing is indicated by absence of clinically significant psychosocial distress or social isolation.           Core Components/Risk Factors/Patient  Goals Review:  Goals and Risk Factor Review    Core Components/Risk Factors/Patient Goals Review    Row Name 07/22/18 1435 08/05/18 0742 09/08/18 0805 10/03/18 1429   Personal Goals Review  Weight Management/Obesity;Hypertension;Lipids;Stress  Weight Management/Obesity;Hypertension;Lipids;Stress  Weight Management/Obesity;Hypertension;Lipids;Stress  Weight Management/Obesity;Hypertension;Lipids;Stress   Review  pt with multiple CAD RF demonstrates eagerness to participate in CR program. pt personal goals are to increase pace of activities with increased strength/stamina, resume normal activities and lose  weight.    pt with multiple CAD RF demonstrates eagerness to participate in CR program. pt personal goals are to increase pace of activities with increased strength/stamina, resume normal activities and lose weight.  pt pleased to be adjusting to her new schedule.  pt is not participating in HEP.   pt with multiple CAD RF demonstrates eagerness to participate in CR program. pt personal goals are to increase pace of activities with increased strength/stamina, resume normal activities and lose weight.  pt feels she is physically improving as she is tolerating increased workloads.  Scheduled exercise helps.  pt is not participating in HEP.   pt with multiple CAD RF demonstrates eagerness to participate in CR program. pt personal goals are to increase pace of activities with increased strength/stamina, resume normal activities and lose weight.  pt feels she is physically improving as she is tolerating increased workloads.  Scheduled exercise helps., pt verbalizes enjoyment from CR actvities.    Expected Outcomes  pt will participate in CR exercise, nutrition and lifestyle modification opportunities to decrease overall RF.    pt will participate in CR exercise, nutrition and lifestyle modification opportunities to decrease overall RF.    pt will participate in CR exercise, nutrition and lifestyle modification opportunities to decrease overall RF.    pt will participate in CR exercise, nutrition and lifestyle modification opportunities to decrease overall RF.            Core Components/Risk Factors/Patient Goals at Discharge (Final Review):  Goals and Risk Factor Review - 10/03/18 1429    Core Components/Risk Factors/Patient Goals Review          Personal Goals Review  Weight Management/Obesity;Hypertension;Lipids;Stress    Review  pt with multiple CAD RF demonstrates eagerness to participate in CR program. pt personal goals are to increase pace of activities with increased strength/stamina, resume normal  activities and lose weight.  pt feels she is physically improving as she is tolerating increased workloads.  Scheduled exercise helps., pt verbalizes enjoyment from CR actvities.     Expected Outcomes  pt will participate in CR exercise, nutrition and lifestyle modification opportunities to decrease overall RF.             ITP Comments: ITP Comments    Row Name 07/12/18 0843 07/22/18 1422 08/10/18 1627 09/08/18 0738 10/03/18 1429   ITP Comments  Dr. Fransico Him, Medical Director   pt started group exercise. pt tolerated light activity without difficulty. pt oriented to exercise equipment and safety routine. pt verbalized understanding.   30 day ITP review. pt demonstrates willingness to participate in CR program.   30 day ITP review. pt demonstrates willingness to participate in CR program.   30 day ITP review. pt demonstrates willingness to participate in CR program.       Comments:

## 2018-10-07 ENCOUNTER — Ambulatory Visit (HOSPITAL_COMMUNITY): Payer: Medicare Other

## 2018-10-07 ENCOUNTER — Encounter (HOSPITAL_COMMUNITY): Payer: Medicare Other

## 2018-10-10 ENCOUNTER — Ambulatory Visit (HOSPITAL_COMMUNITY): Payer: Medicare Other

## 2018-10-10 ENCOUNTER — Encounter (HOSPITAL_COMMUNITY): Payer: Medicare Other

## 2018-10-12 ENCOUNTER — Encounter (HOSPITAL_COMMUNITY): Payer: Medicare Other

## 2018-10-12 ENCOUNTER — Ambulatory Visit (HOSPITAL_COMMUNITY): Payer: Medicare Other

## 2018-10-14 ENCOUNTER — Ambulatory Visit (HOSPITAL_COMMUNITY): Payer: Medicare Other

## 2018-10-14 ENCOUNTER — Encounter (HOSPITAL_COMMUNITY): Payer: Medicare Other

## 2018-10-17 ENCOUNTER — Telehealth (HOSPITAL_COMMUNITY): Payer: Self-pay | Admitting: Cardiac Rehabilitation

## 2018-10-17 ENCOUNTER — Ambulatory Visit (HOSPITAL_COMMUNITY): Payer: Medicare Other

## 2018-10-17 ENCOUNTER — Encounter (HOSPITAL_COMMUNITY): Payer: Medicare Other

## 2018-10-17 NOTE — Telephone Encounter (Signed)
Phone call to inform pt of Cardiac Rehab Phase II departmental closing for 2 weeks.  Pt verbalized understanding. Deveron Furlong, RN, BSN Cardiac Pulmonary Rehab

## 2018-10-19 ENCOUNTER — Ambulatory Visit (HOSPITAL_COMMUNITY): Payer: Medicare Other

## 2018-10-19 ENCOUNTER — Encounter (HOSPITAL_COMMUNITY): Payer: Medicare Other

## 2018-10-21 ENCOUNTER — Ambulatory Visit (HOSPITAL_COMMUNITY): Payer: Medicare Other

## 2018-10-24 ENCOUNTER — Ambulatory Visit (HOSPITAL_COMMUNITY): Payer: Medicare Other

## 2018-10-24 ENCOUNTER — Telehealth (HOSPITAL_COMMUNITY): Payer: Self-pay | Admitting: Cardiac Rehabilitation

## 2018-10-24 NOTE — Telephone Encounter (Signed)
Phone call to pt to advise of Outpatient Cardiac Rehab Departmental closing for Covid-19 precautions.  Pt verbalized understanding.

## 2018-10-24 NOTE — Addendum Note (Signed)
Encounter addended by: Robyne Peers, RN on: 10/24/2018 2:41 PM  Actions taken: Flowsheet data copied forward, Visit Navigator Flowsheet section accepted

## 2018-10-25 ENCOUNTER — Encounter (HOSPITAL_COMMUNITY): Payer: Self-pay | Admitting: Cardiac Rehabilitation

## 2018-10-25 DIAGNOSIS — Z955 Presence of coronary angioplasty implant and graft: Secondary | ICD-10-CM

## 2018-10-25 NOTE — Addendum Note (Signed)
Encounter addended by: Enid Skeens, RD on: 10/25/2018 2:12 PM  Actions taken: Flowsheet data copied forward, Visit Navigator Flowsheet section accepted

## 2018-10-25 NOTE — Progress Notes (Signed)
Discharge Progress Report  Patient Details  Name: Monique [name] MRN: [id number] Date of Birth: November 28, 1940 Referring Provider:   Flowsheet Row CARDIAC REHAB PHASE II ORIENTATION from 07/12/2018 in [place]  Referring Provider  Dr. Gwenlyn Found       Number of Visits: 29   Reason for Discharge:  Patient has met program and personal goals.  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 Gwenlyn Found    Expected Discharge Date  10/21/18        NuStep          Level  2    SPM  75    Minutes  10    METs  1.8        Arm Ergometer          Level  1    Watts  15    Minutes  10    METs  1.93        Track          Laps  6    Minutes  10    METs  2        Prescription Details          Frequency (times per week)  3    Duration  Progress to 30 minutes of continuous aerobic without signs/symptoms of physical distress        Intensity          THRR 40-80% of Max Heartrate  57-114    Ratings of Perceived Exertion  11-13        Progression          Progression  Continue to progress workloads to maintain intensity without signs/symptoms of physical distress.        Resistance Training          Training Prescription  Yes    Weight  2 lbs.     Reps  10-15           Discharge Exercise Prescription (Final Exercise Prescription Changes): Exercise Prescription Changes - 10/05/18 0925    Response to Exercise          Blood Pressure (Admit)  122/70    Blood Pressure (Exercise)  120/70    Blood Pressure (Exit)  120/70    Heart Rate (Admit)  99 bpm    Heart Rate (Exercise)  110 bpm    Heart Rate (Exit)  80 bpm    Rating of  Perceived Exertion (Exercise)  11    Symptoms  None    Duration  Progress to 30 minutes of  aerobic without signs/symptoms of physical distress    Intensity  THRR unchanged        Progression          Progression  Continue to progress workloads to maintain intensity without signs/symptoms of physical distress.    Average METs  2.5        Resistance Training          Training Prescription  No        Treadmill  MPH  2    Grade  0    Minutes  10    METs  2.53        NuStep          Level  3    SPM  75    Minutes  10    METs  2.4        Track          Laps  10    Minutes  10    METs  2.74        Home Exercise Plan          Plans to continue exercise at  Home (comment)    Frequency  Add 1 additional day to program exercise sessions.    Initial Home Exercises Provided  08/05/18           Functional Capacity: 6 Minute Walk    6 Minute Walk    Row Name 07/12/18 1038   Phase  Initial   Distance  976 feet   Walk Time  6 minutes   # of Rest Breaks  1   MPH  1.9   METS  1.9   RPE  13   VO2 Peak  6.65   Symptoms  Yes (comment)   Comments  Knee and back pain   Resting HR  98 bpm   Resting BP  108/70   Resting Oxygen Saturation   94 %   Exercise Oxygen Saturation  during 6 min walk  94 %   Max Ex. HR  117 bpm   Max Ex. BP  142/72   2 Minute Post BP  120/70          Psychological, QOL, Others - Outcomes: PHQ 2/9: Depression screen PHQ 2/9 07/22/2018  Decreased Interest 2  Down, Depressed, Hopeless 2  PHQ - 2 Score 4  Altered sleeping 1  Tired, decreased energy 2  Change in appetite 0  Feeling bad or failure about yourself  2  Trouble concentrating 0  Moving slowly or fidgety/restless 1  Suicidal thoughts 0  PHQ-9 Score 10  Difficult doing work/chores Very difficult    Quality of Life: Quality of Life - 08/08/18 1411    Quality of Life Scores          Health/Function Pre  10.18 %   pt notes improvement in symptoms however still  c/o DOE affecting home care ADLs like bedmaking and dishwashing.    Socioeconomic Pre  23.71 %    Psych/Spiritual Pre  12.29 %   pt meets with Hospice group and individual therapy with positive results.   Family Pre  22.5 %    GLOBAL Pre  15.14 %   overall pt still coping with multiple stressors including personal health, family and grief.  pt accepting of offer to meet with Jeanella Craze. LM for his assistant to call to schedule pt.           Personal Goals: Goals established at orientation with interventions provided to work toward goal. Personal Goals and Risk Factors at Admission - 07/12/18 1045    Core Components/Risk Factors/Patient Goals on Admission           Weight Management  Yes;Obesity;Weight Maintenance;Weight Loss    Intervention  Weight Management: Develop a combined nutrition and exercise program designed to reach desired caloric intake, while maintaining appropriate intake of nutrient and fiber, sodium and fats, and appropriate energy expenditure required for the weight goal.;Weight Management:  Provide education and appropriate resources to help participant work on and attain dietary goals.;Weight Management/Obesity: Establish reasonable short term and long term weight goals.;Obesity: Provide education and appropriate resources to help participant work on and attain dietary goals.    Admit Weight  189 lb 13.1 oz (86.1 kg)    Expected Outcomes  Short Term: Continue to assess and modify interventions until short term weight is achieved;Long Term: Adherence to nutrition and physical activity/exercise program aimed toward attainment of established weight goal;Weight Maintenance: Understanding of the daily nutrition guidelines, which includes 25-35% calories from fat, 7% or less cal from saturated fats, less than 266m cholesterol, less than 1.5gm of sodium, & 5 or more servings of fruits and vegetables daily;Weight Loss: Understanding of general recommendations for a balanced deficit  meal plan, which promotes 1-2 lb weight loss per week and includes a negative energy balance of [phone] kcal/d;Understanding recommendations for meals to include 15-35% energy as protein, 25-35% energy from fat, 35-60% energy from carbohydrates, less than 2029mof dietary cholesterol, 20-35 gm of total fiber daily;Understanding of distribution of calorie intake throughout the day with the consumption of 4-5 meals/snacks    Hypertension  Yes    Intervention  Provide education on lifestyle modifcations including regular physical activity/exercise, weight management, moderate sodium restriction and increased consumption of fresh fruit, vegetables, and low fat dairy, alcohol moderation, and smoking cessation.;Monitor prescription use compliance.    Expected Outcomes  Long Term: Maintenance of blood pressure at goal levels.;Short Term: Continued assessment and intervention until BP is < 140/9039mG in hypertensive participants. < 130/37m81m in hypertensive participants with diabetes, heart failure or chronic kidney disease.    Lipids  Yes    Intervention  Provide education and support for participant on nutrition & aerobic/resistive exercise along with prescribed medications to achieve LDL <70mg64mL >40mg.89mExpected Outcomes  Short Term: Participant states understanding of desired cholesterol values and is compliant with medications prescribed. Participant is following exercise prescription and nutrition guidelines.;Long Term: Cholesterol controlled with medications as prescribed, with individualized exercise RX and with personalized nutrition plan. Value goals: LDL < 70mg, 16m> 40 mg.    Stress  Yes    Intervention  Offer individual and/or small group education and counseling on adjustment to heart disease, stress management and health-related lifestyle change. Teach and support self-help strategies.;Refer participants experiencing significant psychosocial distress to appropriate mental health specialists  for further evaluation and treatment. When possible, include family members and significant others in education/counseling sessions.    Expected Outcomes  Short Term: Participant demonstrates changes in health-related behavior, relaxation and other stress management skills, ability to obtain effective social support, and compliance with psychotropic medications if prescribed.;Long Term: Emotional wellbeing is indicated by absence of clinically significant psychosocial distress or social isolation.            Personal Goals Discharge: Goals and Risk Factor Review    Core Components/Risk Factors/Patient Goals Review    Row Name 07/22/18 1435 08/05/18 0742 09/08/18 0805 10/03/18 1429 10/19/18 1157   Personal Goals Review  Weight Management/Obesity;Hypertension;Lipids;Stress  Weight Management/Obesity;Hypertension;Lipids;Stress  Weight Management/Obesity;Hypertension;Lipids;Stress  Weight Management/Obesity;Hypertension;Lipids;Stress  Weight Management/Obesity;Hypertension;Lipids;Stress   Review  pt with multiple CAD RF demonstrates eagerness to participate in CR program. pt personal goals are to increase pace of activities with increased strength/stamina, resume normal activities and lose weight.    pt with multiple CAD RF demonstrates eagerness to participate in CR program. pt personal goals are to increase pace of activities with  increased strength/stamina, resume normal activities and lose weight.  pt pleased to be adjusting to her new schedule.  pt is not participating in HEP.   pt with multiple CAD RF demonstrates eagerness to participate in CR program. pt personal goals are to increase pace of activities with increased strength/stamina, resume normal activities and lose weight.  pt feels she is physically improving as she is tolerating increased workloads.  Scheduled exercise helps.  pt is not participating in HEP.   pt with multiple CAD RF demonstrates eagerness to participate in CR program. pt  personal goals are to increase pace of activities with increased strength/stamina, resume normal activities and lose weight.  pt feels she is physically improving as she is tolerating increased workloads.  Scheduled exercise helps., pt verbalizes enjoyment from CR actvities.   pt with multiple CAD RF demonstrates eagerness to participate in CR program. pt personal goals are to increase pace of activities with increased strength/stamina, resume normal activities and lose weight.  pt feels she is physically improving as she is tolerating increased workloads.  Scheduled exercise helps., pt verbalizes enjoyment from CR actvities. pt with recent absences from bilateral cataract surgery.  pt currently on hold for departmental closing for COVID 19 precautions.    Expected Outcomes  pt will participate in CR exercise, nutrition and lifestyle modification opportunities to decrease overall RF.    pt will participate in CR exercise, nutrition and lifestyle modification opportunities to decrease overall RF.    pt will participate in CR exercise, nutrition and lifestyle modification opportunities to decrease overall RF.    pt will participate in CR exercise, nutrition and lifestyle modification opportunities to decrease overall RF.    pt will participate in CR exercise, nutrition and lifestyle modification opportunities to decrease overall RF.         Core Components/Risk Factors/Patient Goals Review    Row Name 10/24/18 1440   Personal Goals Review  Weight Management/Obesity;Hypertension;Lipids;Stress   Review  pt with multiple CAD RF demonstrates eagerness to participate in CR program. pt personal goals are to increase pace of activities with increased strength/stamina, resume normal activities and lose weight.  pt feels she is physically improving as she is tolerating increased workloads.  Scheduled exercise helps., pt verbalizes enjoyment from CR actvities. pt with recent absences from bilateral cataract surgery.  pt  currently on hold for departmental closing for COVID 19 precautions.    Expected Outcomes  pt will participate in CR exercise, nutrition and lifestyle modification opportunities to decrease overall RF.            Exercise Goals and Review: Exercise Goals    Exercise Goals    Row Name 07/12/18 1041   Increase Physical Activity  Yes   Intervention  Provide advice, education, support and counseling about physical activity/exercise needs.;Develop an individualized exercise prescription for aerobic and resistive training based on initial evaluation findings, risk stratification, comorbidities and participant's personal goals.   Expected Outcomes  Short Term: Attend rehab on a regular basis to increase amount of physical activity.   Increase Strength and Stamina  Yes   Intervention  Provide advice, education, support and counseling about physical activity/exercise needs.;Develop an individualized exercise prescription for aerobic and resistive training based on initial evaluation findings, risk stratification, comorbidities and participant's personal goals.   Expected Outcomes  Short Term: Increase workloads from initial exercise prescription for resistance, speed, and METs.   Able to understand and use rate of perceived exertion (RPE) scale  Yes  Intervention  Provide education and explanation on how to use RPE scale   Expected Outcomes  Short Term: Able to use RPE daily in rehab to express subjective intensity level;Long Term:  Able to use RPE to guide intensity level when exercising independently   Knowledge and understanding of Target Heart Rate Range (THRR)  Yes   Intervention  Provide education and explanation of THRR including how the numbers were predicted and where they are located for reference   Expected Outcomes  Short Term: Able to state/look up THRR;Long Term: Able to use THRR to govern intensity when exercising independently;Short Term: Able to use daily as guideline for intensity in  rehab   Able to check pulse independently  Yes   Intervention  Provide education and demonstration on how to check pulse in carotid and radial arteries.;Review the importance of being able to check your own pulse for safety during independent exercise   Expected Outcomes  Short Term: Able to explain why pulse checking is important during independent exercise;Long Term: Able to check pulse independently and accurately   Understanding of Exercise Prescription  Yes   Intervention  Provide education, explanation, and written materials on patient's individual exercise prescription   Expected Outcomes  Short Term: Able to explain program exercise prescription;Long Term: Able to explain home exercise prescription to exercise independently          Exercise Goals Re-Evaluation: Exercise Goals Re-Evaluation    Exercise Goal Re-Evaluation    Row Name 08/02/18 1532 08/05/18 1454 09/08/18 1046 10/04/18 1121 10/21/18 0926   Exercise Goals Review  Increase Physical Activity;Increase Strength and Stamina;Able to check pulse independently;Understanding of Exercise Prescription;Knowledge and understanding of Target Heart Rate Range (THRR);Able to understand and use rate of perceived exertion (RPE) scale  Increase Physical Activity;Increase Strength and Stamina;Able to check pulse independently;Understanding of Exercise Prescription;Knowledge and understanding of Target Heart Rate Range (THRR);Able to understand and use rate of perceived exertion (RPE) scale  Increase Physical Activity;Increase Strength and Stamina;Able to understand and use rate of perceived exertion (RPE) scale;Knowledge and understanding of Target Heart Rate Range (THRR);Understanding of Exercise Prescription;Able to check pulse independently  Increase Physical Activity;Increase Strength and Stamina;Able to understand and use rate of perceived exertion (RPE) scale;Knowledge and understanding of Target Heart Rate Range (THRR);Understanding of  Exercise Prescription;Able to check pulse independently  Increase Physical Activity;Increase Strength and Stamina;Able to understand and use rate of perceived exertion (RPE) scale;Knowledge and understanding of Target Heart Rate Range (THRR);Understanding of Exercise Prescription;Able to check pulse independently   Comments  Reviewed METs and goals with Pt. MET level is 2.1. Pt has some pain with exercise due to arthritis and deconditioning.   Reviewed HEP with Pt. Pt is not currently exercising at home. Encouraged Pt to walk 2-4 days for 30 minutes in addition to cardiac rehab.   Reviewed METs and goals with Pt. Pt has a MET level of 2.7 and continues to increase resistance and workloads gradually. Pt is not exercising at home currently. Encouraged Pt to walk 2-3 days per week for 30 minutes in addition to the program.   Reviewed METs and goals with Pt. Pt has a MET level of 2.5. Pt is walking at home 30 minutes a day 2-4 days per week.   Unable to review METs and goals with Pt due to closure of Cardiac Rehab for [facility]. Pt MET level is 2.5. Pt is progressing well.    Expected Outcomes  Will continue to monitor and progress Pt as tolerated.  Will continue to monitor and progress Pt as tolerated.   Will continue to monitor and progress Pt as tolerated.   Will continue to monitor and progress Pt as tolerated.   Will continue to monitor and progress Pt as tolerated.           Nutrition & Weight - Outcomes: Pre Biometrics - 07/12/18 1041    Pre Biometrics          Height  '5\' 5"'  (1.651 m)    Weight  189 lb 13.1 oz (86.1 kg)    Waist Circumference  40 inches    Hip Circumference  43 inches    Waist to Hip Ratio  0.93 %    BMI (Calculated)  31.59    Triceps Skinfold  40 mm    % Body Fat  45.3 %    Grip Strength  18 kg    Flexibility  0 in    Single Leg Stand  2.97 seconds            Nutrition: Nutrition Therapy & Goals - 10/25/18 1412    Nutrition Therapy          Diet  heart healthy,  diabetic        Personal Nutrition Goals          Nutrition Goal  Pt to identify and limit food sources of saturated fat, trans fat, refined carbohydrates and sodium    Personal Goal #2  Pt able to name foods that affect blood glucose.    Personal Goal #3  Pt to explore batch cooking, meal prepping, and new recipes     Personal Goal #4  Pt to identify food quantities necessary to achieve weight loss of 6-15 lbs. at graduation from cardiac rehab.        Intervention Plan          Intervention  Prescribe, educate and counsel regarding individualized specific dietary modifications aiming towards targeted core components such as weight, hypertension, lipid management, diabetes, heart failure and other comorbidities.    Expected Outcomes  Short Term Goal: Understand basic principles of dietary content, such as calories, fat, sodium, cholesterol and nutrients.;Long Term Goal: Adherence to prescribed nutrition plan.           Nutrition Discharge: Nutrition Assessments - 10/25/18 1409    MEDFICTS Scores          Pre Score  13    Post Score  --   pt did not complete post nutrition assessment          Education Questionnaire Score: Knowledge Questionnaire Score - 07/12/18 0907    Knowledge Questionnaire Score          Pre Score  17/24           Goals reviewed with patient; copy given to patient.  Anderson, Therapist, sports, BSN Cardiac Pulmonary Rehab

## 2018-10-26 ENCOUNTER — Ambulatory Visit (HOSPITAL_COMMUNITY): Payer: Medicare Other

## 2018-10-28 ENCOUNTER — Ambulatory Visit (HOSPITAL_COMMUNITY): Payer: Medicare Other

## 2018-10-31 ENCOUNTER — Ambulatory Visit (HOSPITAL_COMMUNITY): Payer: Medicare Other

## 2018-11-02 ENCOUNTER — Ambulatory Visit (HOSPITAL_COMMUNITY): Payer: Medicare Other

## 2018-11-04 ENCOUNTER — Encounter: Payer: Self-pay | Admitting: Internal Medicine

## 2018-11-04 ENCOUNTER — Ambulatory Visit (HOSPITAL_COMMUNITY): Payer: Medicare Other

## 2018-11-04 ENCOUNTER — Telehealth: Payer: Self-pay | Admitting: Cardiovascular Disease

## 2018-11-04 ENCOUNTER — Telehealth (INDEPENDENT_AMBULATORY_CARE_PROVIDER_SITE_OTHER): Payer: Medicare Other | Admitting: Internal Medicine

## 2018-11-04 VITALS — HR 114 | Ht 65.0 in | Wt 180.0 lb

## 2018-11-04 DIAGNOSIS — Z9861 Coronary angioplasty status: Secondary | ICD-10-CM

## 2018-11-04 DIAGNOSIS — I2510 Atherosclerotic heart disease of native coronary artery without angina pectoris: Secondary | ICD-10-CM

## 2018-11-04 DIAGNOSIS — I208 Other forms of angina pectoris: Secondary | ICD-10-CM

## 2018-11-04 DIAGNOSIS — I10 Essential (primary) hypertension: Secondary | ICD-10-CM | POA: Diagnosis not present

## 2018-11-04 DIAGNOSIS — D508 Other iron deficiency anemias: Secondary | ICD-10-CM

## 2018-11-04 DIAGNOSIS — E785 Hyperlipidemia, unspecified: Secondary | ICD-10-CM

## 2018-11-04 DIAGNOSIS — R000 Tachycardia, unspecified: Secondary | ICD-10-CM

## 2018-11-04 NOTE — Telephone Encounter (Signed)
Spoke with pt who states for the past 2 weeks she's been experiencing on and off chest pain that's similar to when she had her stent placed. She states symptoms usually occur when she is in high humidity or outside walking around. She states when she is in her home walking around, she is fine. Pt report she has had 2 episodes at night to where she was woken up with chest pain. She states she has had to take Nitro 5 times over the course of 2 weeks with 3 times being this week. Spoke with Dr. Jacques Navy (DOD) who recommend a virtual visit. Pt added to MD scheduled today 11/04/18 at 3 pm. Pt agreeable and consented.

## 2018-11-04 NOTE — Progress Notes (Addendum)
Virtual Visit via Video Note    Evaluation Performed:  Follow-up visit  This visit type was conducted due to national recommendations for restrictions regarding the COVID-19 Pandemic (e.g. social distancing).  This format is felt to be most appropriate for this patient at this time.  All issues noted in this document were discussed and addressed.  No physical exam was performed (except for noted visual exam findings with Video Visits).  Please refer to the patient's chart (MyChart message for video visits and phone note for telephone visits) for the patient's consent to telehealth for [facility].  Date:  11/04/2018   ID:  Monique KluverAnn T Farias, DOB 03/12/1941, MRN [id number]  Patient Location:  [address] [place] [id number]  [place]   [place]  PCP:  Elizabeth PalauAnderson, Teresa, FNP  Cardiologist:  Nanetta BattyJonathan Berry, MD  Electrophysiologist:  None   Chief Complaint:  Chest pain with activity  History of Present Illness:    Monique [name] is a 78 y.o. female who presents via audio/video conferencing for a telehealth visit today.   Monique [name] is a primary patient of Dr. York RamJonathan Barry, and calls in today with 2 weeks of increasing chest pain with activity.  She notes that on humid days when she walks down her driveway and back she will notice a substernal chest discomfort that radiates to both arms.  This will improve when she stops and rests, and in the past 2 weeks she has had to take nitroglycerin for these episodes.  In addition she has woken up 2 nights including last night with chest discomfort.  She denies increasing sensation of heartburn and takes pantoprazole.  She rates her chest discomfort as a 3-4 out of 10, and notes that this chest pain is similar to the chest pain that she had prior to her coronary angiography with LAD stent in October 2019 with Dr. Allyson SabalBerry.  During stent placement, she notes 10 out of 10 chest pain with stent deployment, and feels as if  this discomfort that she is having now is similar to that pain but on a much lesser scale.  Of note the discomfort only occurs when she is walking outside in hot humid conditions.  She ambulates around her house during our conversation and has no reproducible chest discomfort.  She notes she is able to complete her activities of daily living around the house without chest discomfort or shortness of breath.  She has a history of asthma and has an inhaler.  She has used her rescue inhaler during some of the episodes of chest discomfort, and while it will not completely relieve her chest discomfort, it will reduce it somewhat.  Otherwise she has been taking nitroglycerin for her episodes of chest pain, usually taking 1 nitro, but sometimes having to take 2 for full relief.  When she sits quietly and breathes comfortably her chest discomfort will go away.  She recently completed cardiac rehab and had no difficulties or chest pain during cardiac rehab.  She will occasionally have mild sinus tachycardia, at which point she would be instructed to rest and her heart rate would come down.  She had normal blood pressures with cardiac rehab and notes that her usual blood pressure with cardiac rehab would be something around 115/80.  She has had anemia in the past and continues to have occult blood when checked at her primary care provider's office.  She notes no frank melena or hematochezia.  Her hemoglobin last checked approximately  a month ago was around 12 g/dL per the patient's recollection.  She recalls having a very stressful past 2 years, with the loss of her husband of 50 years in January 2018.  She notes that her health has taken a decline since that time, and she is still speaking to a therapist regarding her emotions surrounding this.  She tells me she does not feel that she has significant depression, but at times has features of a decreased mood.  Surrounding many of the issues in the past 2 years she has  had significant life stressors, but tells me if anything in the recent past she has had the least amount of stress compared to other times in the past 2 years.  She is currently on venlafaxine for mood disorder.  Her medical therapy includes aspirin 81 mg daily and Plavix 75 mg daily.  No major bleeding episodes have been documented.  She takes carvedilol 3.125 mg twice daily, Imdur 15 mg daily, and atorvastatin 80 mg daily.  She takes iron supplementation for anemia.  She also takes lisinopril 40 mg daily for hypertension.  The patient denies dyspnea at rest or with exertion, palpitations, PND, orthopnea, or leg swelling. Denies syncope or presyncope. Denies dizziness or lightheadedness.  The patient does not have symptoms concerning for COVID-19 infection (fever, chills, cough, or new shortness of breath).    Prior CV studies:   The following studies were reviewed today:  Echocardiogram 09/01/2017 demonstrating ejection fraction 55-60%, grade 1 diastolic dysfunction, indeterminate filling pressures, normal RVSP.  Coronary angiogram with PCI of ostial to proximal LAD with a Synergy stent.  Residual disease in the ramus 80% stenosed, and postintervention stenosis of 50% in the LAD.  Moderate disease in the first marginal branch 40%.  Past Medical History:  Diagnosis Date   Anemia     mild as per PCP   Arthritis    hip/ s/p recent shoulder fracture 8/12- RIGHT   Asthma    Coronary artery disease 05/30/2018   LAD PCI/DES   GI bleeding    Hiatal hernia    Humerus fracture    with wrist on right side   Hypertension    hypercholesterolemia/  EKG on chart with clearance and note 06/18/11  Mazzocchi   Pneumonia    Past Surgical History:  Procedure Laterality Date   ABDOMINAL HYSTERECTOMY     COLECTOMY WITH COLOSTOMY CREATION/HARTMANN PROCEDURE N/A 05/07/2016   Procedure: COLOSTOMY CREATION/HARTMANN PROCEDURE;  Surgeon: Romie Levee, MD;  Location: [facility];  Service: General;   Laterality: N/A;   COLONOSCOPY N/A 08/26/2016   Procedure: COLONOSCOPY;  Surgeon: Romie Levee, MD;  Location: [facility];  Service: Endoscopy;  Laterality: N/A;   COLOSTOMY TAKEDOWN N/A 08/27/2016   Procedure: LAPAROSCOPIC COLOSTOMY REVERSAL;  Surgeon: Romie Levee, MD;  Location: [facility];  Service: General;  Laterality: N/A;   CORONARY STENT INTERVENTION N/A 05/30/2018   Procedure: CORONARY STENT INTERVENTION;  Surgeon: Runell Gess, MD;  Location: [facility];  Service: Cardiovascular;  Laterality: N/A;   EYE SURGERY     eyelid tuck bilateral   LAPAROTOMY N/A 05/07/2016   Procedure: EXPLORATORY LAPAROTOMY, LYLSIS OF ADHSIONS, Pam Drown OF PERITONEAL ABSCESS;  Surgeon: Romie Levee, MD;  Location: [facility];  Service: General;  Laterality: N/A;   LEFT HEART CATH AND CORONARY ANGIOGRAPHY N/A 05/30/2018   Procedure: LEFT HEART CATH AND CORONARY ANGIOGRAPHY;  Surgeon: Runell Gess, MD;  Location: [facility];  Service: Cardiovascular;  Laterality: N/A;  PARATHYROIDECTOMY     one lobe- benign per pt   TONSILLECTOMY     TOTAL HIP ARTHROPLASTY  07/08/2011   Procedure: TOTAL HIP ARTHROPLASTY;  Surgeon: Gus Rankin Aluisio;  Location: [facility];  Service: Orthopedics;  Laterality: Right;     Current Meds  Medication Sig   aspirin EC 81 MG tablet Take 81 mg by mouth daily.   atorvastatin (LIPITOR) 80 MG tablet Take 1 tablet (80 mg total) by mouth daily at 6 PM.   carvedilol (COREG) 3.125 MG tablet TAKE 1 TABLET BY MOUTH TWICE DAILY WITH A MEAL   cetirizine (ZYRTEC) 10 MG tablet Take 10 mg by mouth at bedtime.    Cholecalciferol (VITAMIN D PO) Take 1 tablet by mouth daily.   clopidogrel (PLAVIX) 75 MG tablet Take 1 tablet (75 mg total) by mouth daily with breakfast.   ferrous sulfate (FERROUSUL) 325 (65 FE) MG tablet Take 1 tablet (325 mg total) by mouth 2 (two) times daily with a meal.   isosorbide mononitrate (IMDUR) 30 MG 24 hr tablet Take 0.5 tablets (15 mg  total) by mouth daily.   lisinopril (PRINIVIL,ZESTRIL) 40 MG tablet Take 40 mg by mouth daily.   magnesium oxide (MAG-OX) 400 MG tablet Take 400 mg by mouth daily.   nitroGLYCERIN (NITROSTAT) 0.4 MG SL tablet Place 1 tablet (0.4 mg total) under the tongue every 5 (five) minutes as needed.   oxyCODONE-acetaminophen (PERCOCET) 7.5-325 MG tablet Take 1 tablet by mouth every 4 (four) hours.    pantoprazole (PROTONIX) 40 MG tablet Take 1 tablet (40 mg total) by mouth 2 (two) times daily before a meal.   polyethylene glycol (MIRALAX / GLYCOLAX) packet Take 17 g by mouth 2 (two) times daily. (Patient taking differently: Take 17 g by mouth at bedtime. )   venlafaxine XR (EFFEXOR-XR) 75 MG 24 hr capsule Take 225 mg by mouth at bedtime.      Allergies:   Morphine and related and Pregabalin   Social History   Tobacco Use   Smoking status: Never Smoker   Smokeless tobacco: Never Used  Substance Use Topics   Alcohol use: Yes    Comment: socially- 2 x year   Drug use: No     Family Hx: The patient's family history includes CAD (age of onset: 37) in her father.  ROS:   Please see the history of present illness.     All other systems reviewed and are negative.   Labs/Other Tests and Data Reviewed:    Recent Labs: 05/31/2018: BUN 16; Creatinine, Ser 0.73; Potassium 3.9; Sodium 141 06/08/2018: Hemoglobin 8.5; Platelets 235   Recent Lipid Panel Lab Results  Component Value Date/Time   CHOL 135 06/01/2018 03:45 AM   TRIG 150 (H) 06/01/2018 03:45 AM   HDL 49 06/01/2018 03:45 AM   CHOLHDL 2.8 06/01/2018 03:45 AM   LDLCALC 56 06/01/2018 03:45 AM    Wt Readings from Last 3 Encounters:  11/04/18 180 lb (81.6 kg)  07/13/18 188 lb (85.3 kg)  07/12/18 189 lb 13.1 oz (86.1 kg)     Objective:    Vital Signs:  Pulse (!) 114    Ht 5\' 5"  (1.651 m)    Wt 180 lb (81.6 kg)    BMI 29.95 kg/m   She was unable to obtain her blood pressure at home. Well nourished, well developed female  in no acute distress. Cardiopulmonary: No increased work of breathing, ambulates easily without dyspnea or chest pain Neuropsych: Alert and oriented x3,  normal mood and affect  ASSESSMENT & PLAN:    1. Exertional angina (HCC)   2. Essential hypertension   3. Dyslipidemia, goal LDL below 70   4. CAD S/P percutaneous coronary angioplasty   5. Other iron deficiency anemia   6. Sinus tachycardia    We discussed the red flag symptoms for which to present to the emergency department or call 911, including severe unrelenting substernal chest pain unresponsive to nitroglycerin or worsening shortness of breath, or any urgent concerning symptoms.  She understands these and does not feel she is having any of these at this time.  I have asked her if she would like an in person visit due to concerns over her health, and she says no at this time.  She is appreciative for the telehealth interaction.  I suspect that her symptoms are coming from the residual coronary artery disease that was felt to be best managed medically.  She has faithfully taken her aspirin and Plavix without interruption, as far as she can recall.  We discussed up titration of antianginal therapy as an initial step and monitoring of symptoms.  I have offered to her either up titration of her Coreg or Imdur.  We discussed the risks, benefits, and side effects of these 2 medication therapies.  We participated in shared decision making and determined that we will increase the dose of Imdur to 30 mg daily which she takes at night.  She will report to [place] on Monday or Tuesday how this has impacted her chest pain.  If spaced at an interval, she is welcome to continue taking sublingual nitroglycerin for episodes of chest discomfort.  She is to return to her usual 15 mg dose if she experiences worsening dizziness lightheadedness, presyncope or syncope.  If Imdur is not well-tolerated (she experiences quite a bit of nausea with her Imdur and has to take  it with food) then I would next recommend up titration of carvedilol.  She is agreeable to this plan and understands to contact us.  Depending on her symptoms, we will determine a follow-up plan and arrange follow-up with her primary cardiologist Dr. Gery Pray.  It was a pleasure to speak to Mrs. Monique [name] today.  I have answered her questions to the best my ability.   COVID-19 Education: The signs and symptoms of COVID-19 were discussed with the patient and how to seek care for testing (follow up with PCP or arrange E-visit).  The importance of social distancing was discussed today.  Patient Risk:   After full review of this patient's clinical status, I feel that they are at least moderate risk at this time.  Time:   Today, I have spent 30 minutes with the patient with telehealth technology discussing exertional angina, medication therapy, red flag symptoms, and other medical conditions..     Medication Adjustments/Labs and Tests Ordered: Current medicines are reviewed at length with the patient today.  Concerns regarding medicines are outlined above.  Tests Ordered: No orders of the defined types were placed in this encounter.  Medication Changes: No orders of the defined types were placed in this encounter.   Disposition:  Follow up in 3 day(s)  Signed, Parke Poisson, MD  11/04/2018 6:10 PM    [facility]     Medication Instructions:   Increase imdur to 30 mg daily over the weekend and call Monday or Tuesday with update on symptoms.  If she does not tolerate imdur 30 daily, can  return to 15 mg daily and we can consider increasing carvediolol dose.    If you need a refill on your cardiac medications before your next appointment, please call your pharmacy.   Lab work:  not needed   If you have labs (blood work) drawn today and your tests are completely normal, you will receive your results only by:  MyChart Message (if you have MyChart)  OR  A paper copy in the mail If you have any lab test that is abnormal or we need to change your treatment, we will call you to review the results.  Testing/Procedures:  Not needed  Follow-Up: At [facility], you and your health needs are our priority.  As part of our continuing mission to provide you with exceptional heart care, we have created designated Provider Care Teams.  These Care Teams include your primary Cardiologist (physician) and Advanced Practice Providers (APPs -  Physician Assistants and Nurse Practitioners) who all work together to provide you with the care you need, when you need it.  F/u to be determined base on med titration and symptoms.     Any Other Special Instructions Will Be Listed Below (If Applicable).  Reviewed red flag symptoms requiring urgent call or presentation to ED including increasing chest pain, chest pain at rest, or unrelenting shortness of breath.

## 2018-11-04 NOTE — Telephone Encounter (Signed)
New Message   Pt c/o of Chest Pain: STAT if CP now or developed within 24 hours  1. Are you having CP right now? No, but if she goes outside and walk, she gets SOB   2. Are you experiencing any other symptoms (ex. SOB, nausea, vomiting, sweating)? Some SOB  3. How long have you been experiencing CP? About 2 weeks   4. Is your CP continuous or coming and going? Continuous   5. Have you taken Nitroglycerin?  Yes, 5 in the last 3 or 4 days

## 2018-11-04 NOTE — Patient Instructions (Addendum)
    Medication Instructions:   Increase imdur to 30 mg daily over the weekend and call Monday or Tuesday with update on symptoms.  If she does not tolerate imdur 30 daily, can return to 15 mg daily and we can consider increasing carvediolol dose.    If you need a refill on your cardiac medications before your next appointment, please call your pharmacy.   Lab work:  not needed   If you have labs (blood work) drawn today and your tests are completely normal, you will receive your results only by: Marland Kitchen MyChart Message (if you have MyChart) OR . A paper copy in the mail If you have any lab test that is abnormal or we need to change your treatment, we will call you to review the results.  Testing/Procedures:  Not needed  Follow-Up: At [facility], you and your health needs are our priority.  As part of our continuing mission to provide you with exceptional heart care, we have created designated Provider Care Teams.  These Care Teams include your primary Cardiologist (physician) and Advanced Practice Providers (APPs -  Physician Assistants and Nurse Practitioners) who all work together to provide you with the care you need, when you need it.  F/u to be determined base on med titration and symptoms.     Any Other Special Instructions Will Be Listed Below (If Applicable).  Reviewed red flag symptoms requiring urgent call or presentation to ED including increasing chest pain, chest pain at rest, or unrelenting shortness of breath.

## 2018-11-05 NOTE — Progress Notes (Signed)
Thx for talking to Monique [name].  JJB

## 2018-11-07 ENCOUNTER — Ambulatory Visit (HOSPITAL_COMMUNITY): Payer: Medicare Other

## 2018-11-08 ENCOUNTER — Telehealth: Payer: Self-pay | Admitting: Internal Medicine

## 2018-11-08 NOTE — Telephone Encounter (Signed)
Patient was called, states that since increasing the isosorbide from 15mg  to 30mg , patient does not seem to have relief. She has been having to take Nitroglycerin to ease the pain (last dose early this morning at 2:30-3:00. She states it does help the pain, but then it comes back.   She states you had mentioned about starting a new medication, but she is unsure of what that is currently.   Patient unable to check BP but did give a HR of 86 at this time.   Advised patient I would route a message to Dr.Acharya to have her make recommendations.

## 2018-11-08 NOTE — Telephone Encounter (Signed)
I would recommend uptitrating carvedilol dose. Increase to coreg 6.25 mg BID. Can continue taking imdur 30 mg daily if no lightheadedness, dizziness or other concerning symptoms.   I am forwarding this exchange to her primary cardiologist Dr. Allyson Sabal. I would be happy to call her and discuss as needed, let me know how I can help.   Please have her call us by Thursday-Friday and inform us how she is doing.

## 2018-11-08 NOTE — Telephone Encounter (Signed)
Called patient, advised of message of Dr.Acharya.  Patient verbalized understanding of increased Carvedilol, and to call on Thursday/Friday.

## 2018-11-08 NOTE — Telephone Encounter (Signed)
  Monique [name] had a virtual visit with Dr Jacques Navy on 11/04/18 in which she discussed that she had been having chest pain for the past couple weeks. Dr Jacques Navy changed her Isosorbide to 30mg  to see if that would help. Patient states that she has continued to have chest pains and has had to take her nitro numerous times, sometimes she has to take two, but it does seem to help. Monique [name] would like to know what to do next since the chest pain continues.

## 2018-11-09 ENCOUNTER — Ambulatory Visit (HOSPITAL_COMMUNITY): Payer: Medicare Other

## 2018-11-10 ENCOUNTER — Telehealth: Payer: Self-pay | Admitting: Internal Medicine

## 2018-11-10 NOTE — Telephone Encounter (Signed)
Follow up:    Patient calling to report how she feeling.

## 2018-11-10 NOTE — Telephone Encounter (Signed)
Pt states she was calling back for an update. She report the first two night she felt fine, but has since starting having the pain again at night. Will route to MD

## 2018-11-10 NOTE — Telephone Encounter (Signed)
See previous encounter

## 2018-11-10 NOTE — Telephone Encounter (Addendum)
I called the patient at 2:43 pm and spoke for 10 minutes. She had two good nights after increasing carvedilol dose, and has had no negative side effects. She had 3 episodes of nitro responsive CP last night, took one nitro for 2 of those episodes, and CP resolved in 2-3 minutes. She has noticed that her exertional chest pain has decreased with our medication changes made previously (increasing imdur to 30 mg and carvediolol to 6.25 mg BID).  Per her preference, she is going to uptitrate her Imdur to 45 mg daily today and tomorrow. If she has symptoms of hypotension, she will decrease back to 30mg  and call us by end of day tomorrow.  If she does well with 45 mg Imdur, she can try Imdur 60 mg over the weekend, and again back down on dose to best tolerated dose if she has hypotensive symptoms. We discussed these today.   She will plan to call Monday with symptom update. If still having chest pain, we can uptitrate carvediolol further. She is happy with this plan and would like to stay out of the office if possible. She knows to call 911 if she has persistent CP that is not responsive to nitro. We participated in shared decision making on medication changes.   We can also consider adding amlodipine at some point, but it would be best if a blood pressure can be obtained before adding an additional medication.

## 2018-11-11 ENCOUNTER — Ambulatory Visit (HOSPITAL_COMMUNITY): Payer: Medicare Other

## 2018-11-11 NOTE — Telephone Encounter (Signed)
Gaya, thanks for checking on Monique [name].  She is a very nice lady.  She unfortunately has proximal LAD disease which was stented and had a small area that would not yield most likely related to dense circumferential calcium.  I went up with a noncompliant balloon up to 25 atm without benefit.  I think this is probably the root cause of her symptoms.  She is at risk for "in-stent restenosis as well.  If she continues to have angina she may require repeat catheterization and consideration of LIMA graft to the LAD. 

## 2018-11-11 NOTE — Telephone Encounter (Signed)
Left message on pt phone stating Dr. Allyson Sabal would like pt to be scheduled for virtual visit in the next week or so. Requested that pt call office on Monday to set up an appt.   Note: max number of pts for week of 4/13 is 14. Pt can be placed in appt time with 30 min window

## 2018-11-12 ENCOUNTER — Telehealth: Payer: Self-pay | Admitting: Internal Medicine

## 2018-11-12 NOTE — Telephone Encounter (Signed)
The patient called in to the on-call answering service, and I was paged as the on-call physician.  Fortunately I am familiar with the patient and we have spoken several times over the past week.  I promptly returned her call at 4:18 PM and we spoke for 10 minutes.  As per our previous plan she had increased her Imdur to 45 mg for 04/23/2019 on 11/11/2018.  Yesterday evening as she was lying down to go to sleep, she experienced severe chest pain.  She took 2 nitro and had significant reduction in chest pain but it was not completely gone.  At that point she took her dose of Imdur.  She mistakenly took 2-1/2 tablets instead of 1-1/2 tablets, but had no adverse effects of a dose of 75 mg.  This relieved her chest pain until approximately 2-3 AM, when she again awoke with chest discomfort.  She took nitro with relief.  She was out of her nitroglycerin tablets and call her pharmacy this morning to refill her supply.  We discussed my concern with her chest pain continuing despite medication titration.  I have spoken to Dr. Allyson Sabal her primary cardiologist and interventional cardiologist that she may require repeat cardiac catheterization and consideration for intervention on the proximal LAD lesion, percutaneous or surgical.  I have reviewed with the patient that if she takes 3 nitroglycerin without complete relief of her chest pain she is to present right away to the emergency department by EMS.  I have explained what will occur if she presents to the ER (ECG, troponins, likely initiation of heparin, and hospitalization with plan for coronary angiography for unstable angina).  We will in the meantime uptitrate her isosorbide mononitrate to 60 mg daily.  She understands that I am the on-call physician tomorrow and she is welcome to contact me with questions or concerns.  I have recommended that she call Dr. Allyson Sabal on Monday (she was contacted on Friday but was unable to return the call), and arrange an appointment in  the office Monday or Tuesday with the DOD or Dr. Allyson Sabal.

## 2018-11-14 ENCOUNTER — Telehealth: Payer: Self-pay | Admitting: Cardiovascular Disease

## 2018-11-14 ENCOUNTER — Ambulatory Visit (HOSPITAL_COMMUNITY): Payer: Medicare Other

## 2018-11-14 ENCOUNTER — Telehealth: Payer: Self-pay

## 2018-11-14 NOTE — Telephone Encounter (Signed)
Smart phone/MyChart/pre reg complete. 11-14-18 ST The patient consents to telephone visit. 11-14-18

## 2018-11-14 NOTE — Telephone Encounter (Signed)
Pt agreeable with virtual appointment time of 2:30p on 4/14. Pt added to schedule

## 2018-11-15 ENCOUNTER — Telehealth: Payer: Self-pay

## 2018-11-15 ENCOUNTER — Telehealth (INDEPENDENT_AMBULATORY_CARE_PROVIDER_SITE_OTHER): Payer: Medicare Other | Admitting: Cardiovascular Disease

## 2018-11-15 ENCOUNTER — Other Ambulatory Visit: Payer: Self-pay

## 2018-11-15 VITALS — HR 97

## 2018-11-15 DIAGNOSIS — I10 Essential (primary) hypertension: Secondary | ICD-10-CM

## 2018-11-15 DIAGNOSIS — I2510 Atherosclerotic heart disease of native coronary artery without angina pectoris: Secondary | ICD-10-CM | POA: Diagnosis not present

## 2018-11-15 DIAGNOSIS — Z01812 Encounter for preprocedural laboratory examination: Secondary | ICD-10-CM | POA: Diagnosis not present

## 2018-11-15 DIAGNOSIS — E785 Hyperlipidemia, unspecified: Secondary | ICD-10-CM

## 2018-11-15 DIAGNOSIS — Z9861 Coronary angioplasty status: Secondary | ICD-10-CM

## 2018-11-15 NOTE — Telephone Encounter (Signed)
Virtual Visit Pre-Appointment Phone Call  Steps For Call:  Confirm consent - "In the setting of the current Covid19 crisis, you are scheduled for a (phone or video) visit with your provider on (date) at (time).  Just as we do with many in-office visits, in order for you to participate in this visit, we must obtain consent.  If you'd like, I can send this to your mychart (if signed up) or email for you to review.  Otherwise, I can obtain your verbal consent now.  All virtual visits are billed to your insurance company just like a normal visit would be.  By agreeing to a virtual visit, we'd like you to understand that the technology does not allow for your provider to perform an examination, and thus may limit your provider's ability to fully assess your condition.  Finally, though the technology is pretty good, we cannot assure that it will always work on either your or our end, and in the setting of a video visit, we may have to convert it to a phone-only visit.  In either situation, we cannot ensure that we have a secure connection.  Are you willing to proceed?" STAFF: Did the patient verbally acknowledge consent to telehealth visit? YES 1. Confirm the BEST phone number to call the day of the visit:   2. Give patient instructions for WebEx/MyChart download to smartphone as below or Doximity/Doxy.me if video visit (depending on what platform provider is using)  3. Advise patient to be prepared with any vital sign or heart rhythm information, their current medicines, and a piece of paper and pen handy for any instructions they may receive the day of their visit  4. Inform patient they will receive a phone call 15 minutes prior to their appointment time (may be from unknown caller ID) so they should be prepared to answer  5. Confirm that appointment type is correct in Epic appointment notes (video vs telephone)     TELEPHONE CALL NOTE  Monique [name] has been deemed a candidate for a  follow-up tele-health visit to limit community exposure during the Covid-19 pandemic. I spoke with the patient via phone to ensure availability of phone/video source, confirm preferred email & phone number, and discuss instructions and expectations.  I reminded Monique [name] to be prepared with any vital sign and/or heart rhythm information that could potentially be obtained via home monitoring, at the time of her visit. I reminded Monique [name] to expect a phone call at the time of her visit if her visit.  Harlow Asa, RN 11/15/2018 2:34 PM   DOWNLOADING THE WEBEX APP TO SMARTPHONE  - If Apple, ask patient to go to App Store and type in WebEx in the search bar. Download [employer], the blue/green circle. If Android, go to [employer] and type in [employer] in the search bar. The app is free but as with any other app downloads, their phone may require them to verify saved payment information or Apple/Android password.  - The patient does NOT have to create an account. - On the day of the visit, the assist will walk the patient through joining the meeting with the meeting number/password.  DOWNLOADING THE MYCHART APP TO SMARTPHONE  - If Apple, go to [employer] and type in MyChart in the search bar and download the app. If Android, ask patient to go to [employer] and type in [place] in the search bar and download the app.  The app is free but as with any other app downloads, their phone may require them to verify saved payment information or Apple/Android password.  - The patient will need to then log into the app with their MyChart username and password, and select [facility] as their healthcare provider to link the account. When it is time for your visit, go to the MyChart app, find appointments, and click Begin Video Visit. Be sure to Select Allow for your device to access the Microphone and Camera for your visit. You will then be connected, and your provider will be  with you shortly.  **If they have any issues connecting, or need assistance please contact MyChart service desk [phone])**  **If using a computer, in order to ensure the best quality for your visit they will need to use either of the following Internet Browsers: Microsoft Marshall, or Google Chrome**  FULL LENGTH CONSENT FOR TELE-HEALTH VISIT   I hereby voluntarily request, consent and authorize [facility] and its employed or contracted physicians, physician assistants, nurse practitioners or other licensed health care professionals (the Practitioner), to provide me with telemedicine health care services (the Services") as deemed necessary by the treating Practitioner. I acknowledge and consent to receive the Services by the Practitioner via telemedicine. I understand that the telemedicine visit will involve communicating with the Practitioner through live audiovisual communication technology and the disclosure of certain medical information by electronic transmission. I acknowledge that I have been given the opportunity to request an in-person assessment or other available alternative prior to the telemedicine visit and am voluntarily participating in the telemedicine visit.  I understand that I have the right to withhold or withdraw my consent to the use of telemedicine in the course of my care at any time, without affecting my right to future care or treatment, and that the Practitioner or I may terminate the telemedicine visit at any time. I understand that I have the right to inspect all information obtained and/or recorded in the course of the telemedicine visit and may receive copies of available information for a reasonable fee.  I understand that some of the potential risks of receiving the Services via telemedicine include:   Delay or interruption in medical evaluation due to technological equipment failure or disruption;  Information transmitted may not be sufficient (e.g.  poor resolution of images) to allow for appropriate medical decision making by the Practitioner; and/or   In rare instances, security protocols could fail, causing a breach of personal health information.  Furthermore, I acknowledge that it is my responsibility to provide information about my medical history, conditions and care that is complete and accurate to the best of my ability. I acknowledge that Practitioner's advice, recommendations, and/or decision may be based on factors not within their control, such as incomplete or inaccurate data provided by me or distortions of diagnostic images or specimens that may result from electronic transmissions. I understand that the practice of medicine is not an exact science and that Practitioner makes no warranties or guarantees regarding treatment outcomes. I acknowledge that I will receive a copy of this consent concurrently upon execution via email to the email address I last provided but may also request a printed copy by calling the office of [facility].    I understand that my insurance will be billed for this visit.   I have read or had this consent read to me.  I understand the contents of this consent, which adequately explains the benefits and risks  of the Services being provided via telemedicine.   I have been provided ample opportunity to ask questions regarding this consent and the Services and have had my questions answered to my satisfaction.  I give my informed consent for the services to be provided through the use of telemedicine in my medical care  By participating in this telemedicine visit I agree to the above.

## 2018-11-15 NOTE — Telephone Encounter (Signed)
Patient and/or DPR-approved person aware of AVS instructions regarding lab work and upcoming procedure scheduled for 4/16 and verbalized understanding. AVS made available for pt in MyChart. Pt was able to retrieve and review with nurse

## 2018-11-15 NOTE — H&P (View-Only) (Signed)
 Virtual Visit via Video Note   This visit type was conducted due to national recommendations for restrictions regarding the COVID-19 Pandemic (e.g. social distancing) in an effort to limit this patient's exposure and mitigate transmission in our community.  Due to her co-morbid illnesses, this patient is at least at moderate risk for complications without adequate follow up.  This format is felt to be most appropriate for this patient at this time.  All issues noted in this document were discussed and addressed.  A limited physical exam was performed with this format.  Please refer to the patient's chart for her consent to telehealth for [facility].   Evaluation Performed:  Follow-up visit  Date:  11/15/2018   ID:  Monique [name], DOB 06/30/1941, MRN [id number]  Patient Location: Home  Provider Location: Home  PCP:  Anderson, Teresa, FNP  Cardiologist:  Destine Ambroise, MD  Electrophysiologist:  None   Chief Complaint: Chest pain  History of Present Illness:    Monique [name] is a 78y.o.  moderately overweight widowed Caucasian female mother of 2, grandmother and 2 grandchildren's husband Herbert was a long-term patient of mine who died 08/07/16. 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 Shewasreferred by Teresa Anderson nurse practitioner for a symptomatic sinus tachycardia.I last saw her in the office  07/13/2018.. Hercardiac risk factors include treated hypertension and hyperlipidemia patient is a family history of heart disease with father who died of a myocardial function and age 41. She has never had a heart attack or stroke. She denies chest pain or shortness of breath. She does exercise and does yoga as well. She has reactive airways disease. She is noted to be tachycardic by her PCP was referred here for further evaluation.  When I saw her in the office 05/27/2018 she was complaining of increasing dyspnea and exertional chest pain.  I performed radial  diagnostic cath 05/22/2018 revealing high-grade calcified proximal LAD stenosis.  I stented her with a 2.5 mm x 20 mm long Synergy drug-eluting stent however there was a small area that would not completely expand which left her with a fairly focal 50% stenosis within the dilated segment.  She also had an 80% small ramus branch stenosis which was untreated.  Her angina has resolved her dyspnea has improved.  She was significantly anemic with a hemoglobin of 8.1 during her hospitalization which is improved with iron repletion up to the low 9 range.  She had done well until earlier this month when she started developing crescendo angina.  She was seen by Dr. Chhabria on 11/04/2018 who adjusted her medications and increased her long-acting oral nitrate.  She continues to have effort angina which is fairly reproducible as well as some unstable symptoms at night.  I suspect her proximal LAD stent has restenosed and that her best option would be CABG.  She is on aspirin and Plavix.  I am arranging for her to undergo diagnostic coronary angiography by myself this coming Thursday.  The patient does not have symptoms concerning for COVID-19 infection (fever, chills, cough, or new shortness of breath).    Past Medical History:  Diagnosis Date  . Anemia     mild as per PCP  . Arthritis    hip/ s/p recent shoulder fracture 8/12- RIGHT  . Asthma   . Coronary artery disease 05/30/2018   LAD PCI/DES  . GI bleeding   . Hiatal hernia   . Humerus fracture    with wrist on right   side  . Hypertension    hypercholesterolemia/  EKG on chart with clearance and note 06/18/11  Mazzocchi  . Pneumonia    Past Surgical History:  Procedure Laterality Date  . ABDOMINAL HYSTERECTOMY    . COLECTOMY WITH COLOSTOMY CREATION/HARTMANN PROCEDURE N/A 05/07/2016   Procedure: COLOSTOMY CREATION/HARTMANN PROCEDURE;  Surgeon: Romie Levee, MD;  Location: [facility];  Service: General;  Laterality: N/A;  . COLONOSCOPY N/A 08/26/2016    Procedure: COLONOSCOPY;  Surgeon: Romie Levee, MD;  Location: [facility];  Service: Endoscopy;  Laterality: N/A;  . COLOSTOMY TAKEDOWN N/A 08/27/2016   Procedure: LAPAROSCOPIC COLOSTOMY REVERSAL;  Surgeon: Romie Levee, MD;  Location: [facility];  Service: General;  Laterality: N/A;  . CORONARY STENT INTERVENTION N/A 05/30/2018   Procedure: CORONARY STENT INTERVENTION;  Surgeon: Runell Gess, MD;  Location: [facility];  Service: Cardiovascular;  Laterality: N/A;  . EYE SURGERY     eyelid tuck bilateral  . LAPAROTOMY N/A 05/07/2016   Procedure: EXPLORATORY LAPAROTOMY, LYLSIS OF ADHSIONS, EVACATION OF PERITONEAL ABSCESS;  Surgeon: Romie Levee, MD;  Location: [facility];  Service: General;  Laterality: N/A;  . LEFT HEART CATH AND CORONARY ANGIOGRAPHY N/A 05/30/2018   Procedure: LEFT HEART CATH AND CORONARY ANGIOGRAPHY;  Surgeon: Runell Gess, MD;  Location: [facility];  Service: Cardiovascular;  Laterality: N/A;  . PARATHYROIDECTOMY     one lobe- benign per pt  . TONSILLECTOMY    . TOTAL HIP ARTHROPLASTY  07/08/2011   Procedure: TOTAL HIP ARTHROPLASTY;  Surgeon: Gus Rankin Aluisio;  Location: [facility];  Service: Orthopedics;  Laterality: Right;     Current Meds  Medication Sig  . clopidogrel (PLAVIX) 75 MG tablet Take 1 tablet (75 mg total) by mouth daily with breakfast.  . isosorbide mononitrate (IMDUR) 30 MG 24 hr tablet Take 0.5 tablets (15 mg total) by mouth daily.     Allergies:   Morphine and related and Pregabalin   Social History   Tobacco Use  . Smoking status: Never Smoker  . Smokeless tobacco: Never Used  Substance Use Topics  . Alcohol use: Yes    Comment: socially- 2 x year  . Drug use: No     Family Hx: The patient's family history includes CAD (age of onset: 71) in her father.  ROS:   Please see the history of present illness.     All other systems reviewed and are negative.   Prior CV studies:   The following studies were reviewed today:   None  Labs/Other Tests and Data Reviewed:    EKG:  No ECG reviewed.  Recent Labs: 05/31/2018: BUN 16; Creatinine, Ser 0.73; Potassium 3.9; Sodium 141 06/08/2018: Hemoglobin 8.5; Platelets 235   Recent Lipid Panel Lab Results  Component Value Date/Time   CHOL 135 06/01/2018 03:45 AM   TRIG 150 (H) 06/01/2018 03:45 AM   HDL 49 06/01/2018 03:45 AM   CHOLHDL 2.8 06/01/2018 03:45 AM   LDLCALC 56 06/01/2018 03:45 AM    Wt Readings from Last 3 Encounters:  11/04/18 180 lb (81.6 kg)  07/13/18 188 lb (85.3 kg)  07/12/18 189 lb 13.1 oz (86.1 kg)     Objective:    Vital Signs:  Pulse 97    A physical exam was not performed today since this was a telemedicine virtual video visit.  ASSESSMENT & PLAN:    1. Coronary artery disease- history of CAD status post proximal LAD PCI drug-eluting stenting with a synergy drug-eluting stent (2.5 mm  x 20 mm).  This was suboptimally expanded with a 40 to 50% fairly focal proximal stenosis within the stent secondary to calcification and failure to yield using a fairly focal high-pressure balloon.  She had residual 80% small ramus branch stenosis and mild first obtuse marginal branch stenosis.  She has had recurrent symptoms earlier this month which are crescendo and fairly predictable with exertion as well as some nocturnal symptoms as well.  She saw Dr. Jacques NavyAcharya who adjusted her long-acting oral nitrate although she continues to have symptoms.  She is on dual antiplatelet therapy including aspirin Plavix.  She will need left heart cath via right radial approach electively as an outpatient this Thursday.  COVID-19 Education: The signs and symptoms of COVID-19 were discussed with the patient and how to seek care for testing (follow up with PCP or arrange E-visit).  The importance of social distancing was discussed today.  Time:   Today, I have spent 7 minutes with the patient with telehealth technology discussing the above problems.     Medication  Adjustments/Labs and Tests Ordered: Current medicines are reviewed at length with the patient today.  Concerns regarding medicines are outlined above.   Tests Ordered: No orders of the defined types were placed in this encounter.   Medication Changes: No orders of the defined types were placed in this encounter.   Disposition:  Follow up Schedule left heart cath/right radial approach Thursday 4/16  Signed, Nanetta BattyJonathan Stephano Arrants, MD  11/15/2018 2:42 PM    [facility]

## 2018-11-15 NOTE — Progress Notes (Signed)
Virtual Visit via Video Note   This visit type was conducted due to national recommendations for restrictions regarding the COVID-19 Pandemic (e.g. social distancing) in an effort to limit this patient's exposure and mitigate transmission in our community.  Due to her co-morbid illnesses, this patient is at least at moderate risk for complications without adequate follow up.  This format is felt to be most appropriate for this patient at this time.  All issues noted in this document were discussed and addressed.  A limited physical exam was performed with this format.  Please refer to the patient's chart for her consent to telehealth for [facility].   Evaluation Performed:  Follow-up visit  Date:  11/15/2018   ID:  Monique [name], DOB 01/23/1941, MRN [id number]  Patient Location: Home  Provider Location: Home  PCP:  [name], FNP  Cardiologist:  [name], MD  Electrophysiologist:  None   Chief Complaint: Chest pain  History of Present Illness:    Monique [name] is a 78y.o.  moderately overweight widowed Caucasian female mother of 2, grandmother and 2 grandchildren's husband Monique [name] was a long-term patient of mine who died 2016-09-06. 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 Shewasreferred by [name] nurse practitioner for a symptomatic sinus tachycardia.I last saw her in the office  07/13/2018.Marland Kitchen Hercardiac risk factors include treated hypertension and hyperlipidemia patient is a family history of heart disease with father who died of a myocardial function and age 24. She has never had a heart attack or stroke. She denies chest pain or shortness of breath. She does exercise and does yoga as well. She has reactive airways disease. She is noted to be tachycardic by her PCP was referred here for further evaluation.  When I saw her in the office 05/27/2018 she was complaining of increasing dyspnea and exertional chest pain.  I performed radial  diagnostic cath 05/22/2018 revealing high-grade calcified proximal LAD stenosis.  I stented her with a 2.5 mm x 20 mm long Synergy drug-eluting stent however there was a small area that would not completely expand which left her with a fairly focal 50% stenosis within the dilated segment.  She also had an 80% small ramus branch stenosis which was untreated.  Her angina has resolved her dyspnea has improved.  She was significantly anemic with a hemoglobin of 8.1 during her hospitalization which is improved with iron repletion up to the low 9 range.  She had done well until earlier this month when she started developing crescendo angina.  She was seen by Dr. Doreene Adas on 11/04/2018 who adjusted her medications and increased her long-acting oral nitrate.  She continues to have effort angina which is fairly reproducible as well as some unstable symptoms at night.  I suspect her proximal LAD stent has restenosed and that her best option would be CABG.  She is on aspirin and Plavix.  I am arranging for her to undergo diagnostic coronary angiography by myself this coming Thursday.  The patient does not have symptoms concerning for COVID-19 infection (fever, chills, cough, or new shortness of breath).    Past Medical History:  Diagnosis Date  . Anemia     mild as per PCP  . Arthritis    hip/ s/p recent shoulder fracture 8/12- RIGHT  . Asthma   . Coronary artery disease 05/30/2018   LAD PCI/DES  . GI bleeding   . Hiatal hernia   . Humerus fracture    with wrist on right  side  . Hypertension    hypercholesterolemia/  EKG on chart with clearance and note 06/18/11  Mazzocchi  . Pneumonia    Past Surgical History:  Procedure Laterality Date  . ABDOMINAL HYSTERECTOMY    . COLECTOMY WITH COLOSTOMY CREATION/HARTMANN PROCEDURE N/A 05/07/2016   Procedure: COLOSTOMY CREATION/HARTMANN PROCEDURE;  Surgeon: Romie Levee, MD;  Location: [facility];  Service: General;  Laterality: N/A;  . COLONOSCOPY N/A 08/26/2016    Procedure: COLONOSCOPY;  Surgeon: Romie Levee, MD;  Location: [facility]OS[facility];  Service: Endoscopy;  Laterality: N/A;  . COLOSTOMY TAKEDOWN N/A 08/27/2016   Procedure: LAPAROSCOPIC COLOSTOMY REVERSAL;  Surgeon: Romie Levee, MD;  Location: [facility];  Service: General;  Laterality: N/A;  . CORONARY STENT INTERVENTION N/A 05/30/2018   Procedure: CORONARY STENT INTERVENTION;  Surgeon: Runell Gess, MD;  Location: [facility] INVAS[facility] CV [facility];  Service: Cardiovascular;  Laterality: N/A;  . EYE SURGERY     eyelid tuck bilateral  . LAPAROTOMY N/A 05/07/2016   Procedure: EXPLORATORY LAPAROTOMY, LYLSIS OF ADHSIONS, EVACATION OF PERITONEAL ABSCESS;  Surgeon: Romie Levee, MD;  Location: [facility];  Service: General;  Laterality: N/A;  . LEFT HEART CATH AND CORONARY ANGIOGRAPHY N/A 05/30/2018   Procedure: LEFT HEART CATH AND CORONARY ANGIOGRAPHY;  Surgeon: Runell Gess, MD;  Location: [facility] CV [facility];  Service: Cardiovascular;  Laterality: N/A;  . PARATHYROIDECTOMY     one lobe- benign per pt  . TONSILLECTOMY    . TOTAL HIP ARTHROPLASTY  07/08/2011   Procedure: TOTAL HIP ARTHROPLASTY;  Surgeon: Gus Rankin Aluisio;  Location: [facility];  Service: Orthopedics;  Laterality: Right;     Current Meds  Medication Sig  . clopidogrel (PLAVIX) 75 MG tablet Take 1 tablet (75 mg total) by mouth daily with breakfast.  . isosorbide mononitrate (IMDUR) 30 MG 24 hr tablet Take 0.5 tablets (15 mg total) by mouth daily.     Allergies:   Morphine and related and Pregabalin   Social History   Tobacco Use  . Smoking status: Never Smoker  . Smokeless tobacco: Never Used  Substance Use Topics  . Alcohol use: Yes    Comment: socially- 2 x year  . Drug use: No     Family Hx: The patient's family history includes CAD (age of onset: 71) in her father.  ROS:   Please see the history of present illness.     All other systems reviewed and are negative.   Prior CV studies:   The following studies were reviewed today:   None  Labs/Other Tests and Data Reviewed:    EKG:  No ECG reviewed.  Recent Labs: 05/31/2018: BUN 16; Creatinine, Ser 0.73; Potassium 3.9; Sodium 141 06/08/2018: Hemoglobin 8.5; Platelets 235   Recent Lipid Panel Lab Results  Component Value Date/Time   CHOL 135 06/01/2018 03:45 AM   TRIG 150 (H) 06/01/2018 03:45 AM   HDL 49 06/01/2018 03:45 AM   CHOLHDL 2.8 06/01/2018 03:45 AM   LDLCALC 56 06/01/2018 03:45 AM    Wt Readings from Last 3 Encounters:  11/04/18 180 lb (81.6 kg)  07/13/18 188 lb (85.3 kg)  07/12/18 189 lb 13.1 oz (86.1 kg)     Objective:    Vital Signs:  Pulse 97    A physical exam was not performed today since this was a telemedicine virtual video visit.  ASSESSMENT & PLAN:    1. Coronary artery disease- history of CAD status post proximal LAD PCI drug-eluting stenting with a synergy drug-eluting stent (2.5 mm  x 20 mm).  This was suboptimally expanded with a 40 to 50% fairly focal proximal stenosis within the stent secondary to calcification and failure to yield using a fairly focal high-pressure balloon.  She had residual 80% small ramus branch stenosis and mild first obtuse marginal branch stenosis.  She has had recurrent symptoms earlier this month which are crescendo and fairly predictable with exertion as well as some nocturnal symptoms as well.  She saw Dr. Jacques NavyAcharya who adjusted her long-acting oral nitrate although she continues to have symptoms.  She is on dual antiplatelet therapy including aspirin Plavix.  She will need left heart cath via right radial approach electively as an outpatient this Thursday.  COVID-19 Education: The signs and symptoms of COVID-19 were discussed with the patient and how to seek care for testing (follow up with PCP or arrange E-visit).  The importance of social distancing was discussed today.  Time:   Today, I have spent 7 minutes with the patient with telehealth technology discussing the above problems.     Medication  Adjustments/Labs and Tests Ordered: Current medicines are reviewed at length with the patient today.  Concerns regarding medicines are outlined above.   Tests Ordered: No orders of the defined types were placed in this encounter.   Medication Changes: No orders of the defined types were placed in this encounter.   Disposition:  Follow up Schedule left heart cath/right radial approach Thursday 4/16  Signed, Nanetta BattyJonathan Jacqeline Broers, MD  11/15/2018 2:42 PM    [facility]

## 2018-11-15 NOTE — Patient Instructions (Signed)
    [facility] CARDIOVASCULAR DIVISION [facility] NORTHLINE [address] [place] [address] [place] [address] Dept: [phone] Loc: [phone]  Monique [name]  11/15/2018  You are scheduled for a Cardiac Catheterization on Thursday, April 16 with Dr. [name].  1. Please arrive at the [facility] (Main Entrance A) at [facility]: [address] [place], [place] [address] at 8:00 AM (This time is two hours before your procedure to ensure your preparation). Free valet parking service is available.   Special note: Every effort is made to have your procedure done on time. Please understand that emergencies sometimes delay scheduled procedures.  2. Diet: Do not eat solid foods after midnight.  The patient may have clear liquids until 5am upon the day of the procedure.  3. Labs: You will need to have blood drawn TOMORROW 11/16/2018 (CBC, BMP)  4. Medication instructions in preparation for your procedure  On the morning of your procedure, take your Plavix/Clopidogrel and any morning medicines NOT listed above.  You may use sips of water.  5. Plan for one night stay--bring personal belongings. 6. Bring a current list of your medications and current insurance cards. 7. You MUST have a responsible person to drive you home. 8. Someone MUST be with you the first 24 hours after you arrive home or your discharge will be delayed. 9. Please wear clothes that are easy to get on and off and wear slip-on shoes.  Thank you for allowing [place] to care for you!   --  Invasive Cardiovascular services

## 2018-11-16 ENCOUNTER — Telehealth: Payer: Self-pay | Admitting: *Deleted

## 2018-11-16 ENCOUNTER — Ambulatory Visit (HOSPITAL_COMMUNITY): Payer: Medicare Other

## 2018-11-16 ENCOUNTER — Other Ambulatory Visit: Payer: Self-pay

## 2018-11-16 NOTE — Telephone Encounter (Addendum)
Pt contacted pre-catheterization scheduled at [facility] for: Thursday April 16,2020 10 AM Verified arrival time and place: [facility] Main Entrance A at:8 AM  No solid food after midnight prior to cath, clear liquids until 5 AM day of procedure. Contrast allergy:no Verified no diabetes medications.  AM meds can be  taken pre-cath with sip of water including: ASA 81 mg Clopidogrel 75 mg  Confirmed patient has responsible person to drive home post procedure and observe 24 hours after arriving home: yes  Pt states she will go to [facility] lab today around 11:30 AM for pre procedure BMP/CBC-both are ordered STAT.     Cardiac Questionnaire:   ____________   COVID-19 Pre-Screening Questions:  . Do you currently have a fever? no . Have you recently travelled on a cruise, internationally, or to [place], [place], [place], [place], [place], or [place], [place] [employer]) ? no . Have you been in contact with someone that is currently pending confirmation of Covid19 testing or has been confirmed to have the Covid19 virus?  no . Are you currently experiencing fatigue or cough? No . Are you currently experiencing new or worsening shortness of breath at rest or with minimal activity? Yes-associated with chest pain-see Dr Allyson Sabal office note 11/15/18 . Have you been in contact with someone that was recently sick with fever/cough/fatigue? no  Pt advised due to Covid-19 pandemic, [facility] is restricting visitors and only patients should present for check-in prior to their procedure. People will not be allowed to enter [facility] with the patient. At this time [facility] is not allowing visitors to all [facility] campuses.

## 2018-11-17 ENCOUNTER — Other Ambulatory Visit: Payer: Self-pay

## 2018-11-17 ENCOUNTER — Encounter (HOSPITAL_COMMUNITY): Payer: Self-pay | Admitting: Cardiovascular Disease

## 2018-11-17 ENCOUNTER — Other Ambulatory Visit (HOSPITAL_COMMUNITY): Payer: Medicare Other

## 2018-11-17 ENCOUNTER — Inpatient Hospital Stay (HOSPITAL_COMMUNITY)
Admission: RE | Disposition: A | Discharge status: Home Health | Payer: Self-pay | Source: Home / Self Care | Attending: Surgery

## 2018-11-17 ENCOUNTER — Inpatient Hospital Stay (HOSPITAL_COMMUNITY): Payer: Medicare Other

## 2018-11-17 ENCOUNTER — Inpatient Hospital Stay (HOSPITAL_COMMUNITY)
Admission: RE | Admit: 2018-11-17 | Discharge: 2018-11-27 | DRG: 233 | Disposition: A | Discharge status: Home Health | Payer: Medicare Other | Attending: Cardiovascular Disease | Admitting: Cardiovascular Disease

## 2018-11-17 DIAGNOSIS — Z6830 Body mass index (BMI) 30.0-30.9, adult: Secondary | ICD-10-CM

## 2018-11-17 DIAGNOSIS — I25110 Atherosclerotic heart disease of native coronary artery with unstable angina pectoris: Principal | ICD-10-CM | POA: Diagnosis present

## 2018-11-17 DIAGNOSIS — T82855A Stenosis of coronary artery stent, initial encounter: Secondary | ICD-10-CM | POA: Diagnosis present

## 2018-11-17 DIAGNOSIS — Z90710 Acquired absence of both cervix and uterus: Secondary | ICD-10-CM | POA: Diagnosis not present

## 2018-11-17 DIAGNOSIS — E669 Obesity, unspecified: Secondary | ICD-10-CM | POA: Diagnosis present

## 2018-11-17 DIAGNOSIS — D62 Acute posthemorrhagic anemia: Secondary | ICD-10-CM | POA: Diagnosis not present

## 2018-11-17 DIAGNOSIS — Z79899 Other long term (current) drug therapy: Secondary | ICD-10-CM

## 2018-11-17 DIAGNOSIS — I5031 Acute diastolic (congestive) heart failure: Secondary | ICD-10-CM | POA: Diagnosis not present

## 2018-11-17 DIAGNOSIS — E785 Hyperlipidemia, unspecified: Secondary | ICD-10-CM | POA: Diagnosis present

## 2018-11-17 DIAGNOSIS — E782 Mixed hyperlipidemia: Secondary | ICD-10-CM | POA: Diagnosis not present

## 2018-11-17 DIAGNOSIS — Z955 Presence of coronary angioplasty implant and graft: Secondary | ICD-10-CM | POA: Diagnosis not present

## 2018-11-17 DIAGNOSIS — Z8249 Family history of ischemic heart disease and other diseases of the circulatory system: Secondary | ICD-10-CM

## 2018-11-17 DIAGNOSIS — Y831 Surgical operation with implant of artificial internal device as the cause of abnormal reaction of the patient, or of later complication, without mention of misadventure at the time of the procedure: Secondary | ICD-10-CM | POA: Diagnosis present

## 2018-11-17 DIAGNOSIS — Z96641 Presence of right artificial hip joint: Secondary | ICD-10-CM | POA: Diagnosis present

## 2018-11-17 DIAGNOSIS — E119 Type 2 diabetes mellitus without complications: Secondary | ICD-10-CM | POA: Diagnosis present

## 2018-11-17 DIAGNOSIS — I2510 Atherosclerotic heart disease of native coronary artery without angina pectoris: Secondary | ICD-10-CM

## 2018-11-17 DIAGNOSIS — I10 Essential (primary) hypertension: Secondary | ICD-10-CM | POA: Diagnosis not present

## 2018-11-17 DIAGNOSIS — I110 Hypertensive heart disease with heart failure: Secondary | ICD-10-CM | POA: Diagnosis present

## 2018-11-17 DIAGNOSIS — D696 Thrombocytopenia, unspecified: Secondary | ICD-10-CM | POA: Diagnosis not present

## 2018-11-17 DIAGNOSIS — M1990 Unspecified osteoarthritis, unspecified site: Secondary | ICD-10-CM | POA: Diagnosis present

## 2018-11-17 DIAGNOSIS — Z01811 Encounter for preprocedural respiratory examination: Secondary | ICD-10-CM

## 2018-11-17 DIAGNOSIS — Z9861 Coronary angioplasty status: Secondary | ICD-10-CM

## 2018-11-17 DIAGNOSIS — Z09 Encounter for follow-up examination after completed treatment for conditions other than malignant neoplasm: Secondary | ICD-10-CM

## 2018-11-17 DIAGNOSIS — Z7902 Long term (current) use of antithrombotics/antiplatelets: Secondary | ICD-10-CM

## 2018-11-17 DIAGNOSIS — Z01810 Encounter for preprocedural cardiovascular examination: Secondary | ICD-10-CM | POA: Diagnosis not present

## 2018-11-17 DIAGNOSIS — R079 Chest pain, unspecified: Secondary | ICD-10-CM | POA: Diagnosis present

## 2018-11-17 DIAGNOSIS — J45909 Unspecified asthma, uncomplicated: Secondary | ICD-10-CM | POA: Diagnosis present

## 2018-11-17 DIAGNOSIS — I200 Unstable angina: Secondary | ICD-10-CM | POA: Diagnosis not present

## 2018-11-17 DIAGNOSIS — Z951 Presence of aortocoronary bypass graft: Secondary | ICD-10-CM

## 2018-11-17 DIAGNOSIS — Z885 Allergy status to narcotic agent status: Secondary | ICD-10-CM

## 2018-11-17 DIAGNOSIS — Z888 Allergy status to other drugs, medicaments and biological substances status: Secondary | ICD-10-CM | POA: Diagnosis not present

## 2018-11-17 DIAGNOSIS — F419 Anxiety disorder, unspecified: Secondary | ICD-10-CM | POA: Diagnosis present

## 2018-11-17 HISTORY — PX: LEFT HEART CATH AND CORONARY ANGIOGRAPHY: CATH118249

## 2018-11-17 LAB — CBC
HCT: 41.5 % (ref 36.0–46.0)
Hemoglobin: 13.8 g/dL (ref 12.0–15.0)
MCH: 31.2 pg (ref 26.0–34.0)
MCHC: 33.3 g/dL (ref 30.0–36.0)
MCV: 93.7 fL (ref 80.0–100.0)
Platelets: 178 10*3/uL (ref 150–400)
RBC: 4.43 MIL/uL (ref 3.87–5.11)
RDW: 13.6 % (ref 11.5–15.5)
WBC: 7.5 10*3/uL (ref 4.0–10.5)
nRBC: 0 % (ref 0.0–0.2)

## 2018-11-17 LAB — BASIC METABOLIC PANEL
Anion gap: 11 (ref 5–15)
BUN: 27 mg/dL — ABNORMAL HIGH (ref 8–23)
CO2: 26 mmol/L (ref 22–32)
Calcium: 9.6 mg/dL (ref 8.9–10.3)
Chloride: 104 mmol/L (ref 98–111)
Creatinine, Ser: 0.68 mg/dL (ref 0.44–1.00)
GFR calc Af Amer: >60 mL/min (ref >60)
GFR calc non Af Amer: >60 mL/min (ref >60)
Glucose, Bld: 114 mg/dL — ABNORMAL HIGH (ref 70–99)
Potassium: 4 mmol/L (ref 3.5–5.1)
Sodium: 141 mmol/L (ref 135–145)

## 2018-11-17 LAB — ECHOCARDIOGRAM COMPLETE
Height: 65 in
Weight: 2880 oz

## 2018-11-17 LAB — SURGICAL PCR SCREEN
MRSA, PCR: NEGATIVE
Staphylococcus aureus: NEGATIVE

## 2018-11-17 SURGERY — LEFT HEART CATH AND CORONARY ANGIOGRAPHY
Anesthesia: LOCAL

## 2018-11-17 MED ORDER — HEPARIN (PORCINE) IN NACL 1000-0.9 UT/500ML-% IV SOLN
INTRAVENOUS | Status: DC | PRN
  Administered 2018-11-17 (×2): 500 mL

## 2018-11-17 MED ORDER — SODIUM CHLORIDE 0.9 % WEIGHT BASED INFUSION
3.0000 mL/kg/h | INTRAVENOUS | Status: DC
  Administered 2018-11-17: 3 mL/kg/h via INTRAVENOUS

## 2018-11-17 MED ORDER — SODIUM CHLORIDE 0.9% FLUSH: 3.0000 mL | INTRAVENOUS | Status: DC | PRN

## 2018-11-17 MED ORDER — ASPIRIN EC 81 MG PO TBEC
81.0000 mg | DELAYED_RELEASE_TABLET | Freq: Every day | ORAL | Status: DC
  Administered 2018-11-19: 81 mg via ORAL
  Filled 2018-11-17: qty 1

## 2018-11-17 MED ORDER — POLYETHYLENE GLYCOL 3350 17 G PO PACK
17.0000 g | PACK | Freq: Two times a day (BID) | ORAL | Status: DC
  Administered 2018-11-17 (×2): 17 g via ORAL
  Filled 2018-11-17 (×8): qty 1

## 2018-11-17 MED ORDER — SODIUM CHLORIDE 0.9 % IV SOLN: 250.0000 mL | INTRAVENOUS | Status: DC | PRN

## 2018-11-17 MED ORDER — LIDOCAINE HCL (PF) 1 % IJ SOLN
INTRAMUSCULAR | Status: AC
  Filled 2018-11-17: qty 30

## 2018-11-17 MED ORDER — VERAPAMIL HCL 2.5 MG/ML IV SOLN
INTRA_ARTERIAL | Status: DC | PRN
  Administered 2018-11-17: 15 mL via INTRA_ARTERIAL

## 2018-11-17 MED ORDER — LISINOPRIL 40 MG PO TABS
40.0000 mg | ORAL_TABLET | Freq: Every day | ORAL | Status: DC
  Administered 2018-11-18 – 2018-11-20 (×3): 40 mg via ORAL
  Filled 2018-11-17 (×4): qty 1

## 2018-11-17 MED ORDER — CARVEDILOL 3.125 MG PO TABS
3.1250 mg | ORAL_TABLET | Freq: Two times a day (BID) | ORAL | Status: DC
  Administered 2018-11-17 – 2018-11-18 (×2): 3.125 mg via ORAL
  Filled 2018-11-17 (×2): qty 1

## 2018-11-17 MED ORDER — HEPARIN (PORCINE) IN NACL 1000-0.9 UT/500ML-% IV SOLN
INTRAVENOUS | Status: AC
  Filled 2018-11-17: qty 1000

## 2018-11-17 MED ORDER — CLOPIDOGREL BISULFATE 75 MG PO TABS: 75.0000 mg | ORAL_TABLET | ORAL | Status: DC

## 2018-11-17 MED ORDER — MIDAZOLAM HCL 2 MG/2ML IJ SOLN
INTRAMUSCULAR | Status: AC
  Filled 2018-11-17: qty 2

## 2018-11-17 MED ORDER — ASPIRIN 81 MG PO CHEW: 81.0000 mg | CHEWABLE_TABLET | ORAL | Status: DC

## 2018-11-17 MED ORDER — PANTOPRAZOLE SODIUM 40 MG PO TBEC
40.0000 mg | DELAYED_RELEASE_TABLET | Freq: Two times a day (BID) | ORAL | Status: DC
  Administered 2018-11-17 – 2018-11-21 (×8): 40 mg via ORAL
  Filled 2018-11-17 (×8): qty 1

## 2018-11-17 MED ORDER — MUPIROCIN 2 % EX OINT
1.0000 "application " | TOPICAL_OINTMENT | Freq: Two times a day (BID) | CUTANEOUS | Status: DC
  Administered 2018-11-17 – 2018-11-20 (×6): 1 via NASAL
  Filled 2018-11-17 (×2): qty 22

## 2018-11-17 MED ORDER — ONDANSETRON HCL 4 MG/2ML IJ SOLN: 4.0000 mg | Freq: Four times a day (QID) | INTRAMUSCULAR | Status: DC | PRN

## 2018-11-17 MED ORDER — NITROGLYCERIN 0.4 MG SL SUBL
SUBLINGUAL_TABLET | SUBLINGUAL | Status: DC | PRN
  Administered 2018-11-17: .4 mg via SUBLINGUAL

## 2018-11-17 MED ORDER — ASPIRIN 81 MG PO CHEW
81.0000 mg | CHEWABLE_TABLET | Freq: Every day | ORAL | Status: DC
  Administered 2018-11-18 – 2018-11-20 (×3): 81 mg via ORAL
  Filled 2018-11-17 (×4): qty 1

## 2018-11-17 MED ORDER — SODIUM CHLORIDE 0.9 % IV SOLN: INTRAVENOUS | Status: AC

## 2018-11-17 MED ORDER — NITROGLYCERIN 1 MG/10 ML FOR IR/CATH LAB
INTRA_ARTERIAL | Status: AC
  Filled 2018-11-17: qty 10

## 2018-11-17 MED ORDER — HEPARIN SODIUM (PORCINE) 1000 UNIT/ML IJ SOLN
INTRAMUSCULAR | Status: DC | PRN
  Administered 2018-11-17: 4000 [IU] via INTRAVENOUS

## 2018-11-17 MED ORDER — HEPARIN SODIUM (PORCINE) 1000 UNIT/ML IJ SOLN
INTRAMUSCULAR | Status: AC
  Filled 2018-11-17: qty 1

## 2018-11-17 MED ORDER — ALBUTEROL SULFATE (2.5 MG/3ML) 0.083% IN NEBU: 2.5000 mg | INHALATION_SOLUTION | RESPIRATORY_TRACT | Status: DC | PRN

## 2018-11-17 MED ORDER — VERAPAMIL HCL 2.5 MG/ML IV SOLN
INTRAVENOUS | Status: AC
  Filled 2018-11-17: qty 2

## 2018-11-17 MED ORDER — ACETAMINOPHEN 325 MG PO TABS
650.0000 mg | ORAL_TABLET | ORAL | Status: DC | PRN
  Administered 2018-11-20: 650 mg via ORAL
  Filled 2018-11-17: qty 2

## 2018-11-17 MED ORDER — VENLAFAXINE HCL ER 75 MG PO CP24
225.0000 mg | ORAL_CAPSULE | Freq: Every day | ORAL | Status: DC
  Administered 2018-11-17 – 2018-11-20 (×4): 225 mg via ORAL
  Filled 2018-11-17 (×5): qty 1

## 2018-11-17 MED ORDER — LABETALOL HCL 5 MG/ML IV SOLN: 10.0000 mg | INTRAVENOUS | Status: AC | PRN

## 2018-11-17 MED ORDER — ATORVASTATIN CALCIUM 80 MG PO TABS
80.0000 mg | ORAL_TABLET | Freq: Every day | ORAL | Status: DC
  Administered 2018-11-18 – 2018-11-26 (×8): 80 mg via ORAL
  Filled 2018-11-17 (×9): qty 1

## 2018-11-17 MED ORDER — SODIUM CHLORIDE 0.9% FLUSH
3.0000 mL | Freq: Two times a day (BID) | INTRAVENOUS | Status: DC
  Administered 2018-11-17 – 2018-11-20 (×4): 3 mL via INTRAVENOUS

## 2018-11-17 MED ORDER — NITROGLYCERIN 0.4 MG SL SUBL
0.4000 mg | SUBLINGUAL_TABLET | SUBLINGUAL | Status: DC | PRN
  Administered 2018-11-17 – 2018-11-21 (×3): 0.4 mg via SUBLINGUAL
  Filled 2018-11-17 (×3): qty 1

## 2018-11-17 MED ORDER — FERROUS SULFATE 325 (65 FE) MG PO TABS
325.0000 mg | ORAL_TABLET | Freq: Two times a day (BID) | ORAL | Status: DC
  Administered 2018-11-17 – 2018-11-20 (×7): 325 mg via ORAL
  Filled 2018-11-17 (×7): qty 1

## 2018-11-17 MED ORDER — SODIUM CHLORIDE 0.9% FLUSH: 3.0000 mL | Freq: Two times a day (BID) | INTRAVENOUS | Status: DC

## 2018-11-17 MED ORDER — FENTANYL CITRATE (PF) 100 MCG/2ML IJ SOLN
INTRAMUSCULAR | Status: DC | PRN
  Administered 2018-11-17: 25 ug via INTRAVENOUS

## 2018-11-17 MED ORDER — MIDAZOLAM HCL 2 MG/2ML IJ SOLN
INTRAMUSCULAR | Status: DC | PRN
  Administered 2018-11-17: 1 mg via INTRAVENOUS

## 2018-11-17 MED ORDER — NITROGLYCERIN 2 % TD OINT
0.5000 [in_us] | TOPICAL_OINTMENT | Freq: Four times a day (QID) | TRANSDERMAL | Status: DC
  Administered 2018-11-17 – 2018-11-18 (×3): 0.5 [in_us] via TOPICAL
  Filled 2018-11-17: qty 30

## 2018-11-17 MED ORDER — SODIUM CHLORIDE 0.9 % IV SOLN
250.0000 mL | INTRAVENOUS | Status: DC | PRN
  Administered 2018-11-21: 13:00:00 via INTRAVENOUS

## 2018-11-17 MED ORDER — HYDRALAZINE HCL 20 MG/ML IJ SOLN: 10.0000 mg | INTRAMUSCULAR | Status: AC | PRN

## 2018-11-17 MED ORDER — IOHEXOL 350 MG/ML SOLN
INTRAVENOUS | Status: DC | PRN
  Administered 2018-11-17: 30 mL via INTRA_ARTERIAL

## 2018-11-17 MED ORDER — FENTANYL CITRATE (PF) 100 MCG/2ML IJ SOLN
INTRAMUSCULAR | Status: AC
  Filled 2018-11-17: qty 2

## 2018-11-17 MED ORDER — SODIUM CHLORIDE 0.9 % WEIGHT BASED INFUSION
1.0000 mL/kg/h | INTRAVENOUS | Status: DC
  Administered 2018-11-17: 1 mL/kg/h via INTRAVENOUS

## 2018-11-17 MED ORDER — NITROGLYCERIN 0.4 MG SL SUBL
SUBLINGUAL_TABLET | SUBLINGUAL | Status: AC
  Filled 2018-11-17: qty 1

## 2018-11-17 SURGICAL SUPPLY — 10 items
CATH OPTITORQUE TIG 4.0 5F (CATHETERS) ×1 IMPLANT
COVER DOME SNAP 22 D (MISCELLANEOUS) ×1 IMPLANT
GLIDESHEATH SLEND A-KIT 6F 22G (SHEATH) ×1 IMPLANT
GUIDEWIRE INQWIRE 1.5J.035X260 (WIRE) IMPLANT
INQWIRE 1.5J .035X260CM (WIRE) ×2
KIT HEART LEFT (KITS) ×2 IMPLANT
PACK CARDIAC CATHETERIZATION (CUSTOM PROCEDURE TRAY) ×2 IMPLANT
TRANSDUCER W/STOPCOCK (MISCELLANEOUS) ×2 IMPLANT
TUBING CIL FLEX 10 FLL-RA (TUBING) ×2 IMPLANT
WIRE HI TORQ VERSACORE-J 145CM (WIRE) ×1 IMPLANT

## 2018-11-17 NOTE — Progress Notes (Signed)
  Echocardiogram 2D Echocardiogram has been performed.  Monique [name] 11/17/2018, 9:56 AM

## 2018-11-17 NOTE — Consult Note (Signed)
[address]       [place] [address]             [phone]      Cardiothoracic Surgery Consultation  Reason for Consult: Severe 2-vessel coronary artery disease with unstable angina Referring Physician: Dr. Marcello Fennel is an 78 y.o. female.  HPI:   The patient is a 78 year old woman with history of hypertension, hyperlipidemia, and coronary artery disease status post PCI of the proximal LAD with a drug-eluting stent in October 2019.  She had started having exertional substernal chest discomfort in August 2019 and was referred to Dr. Allyson Sabal by her PCP.  She underwent diagnostic cardiac catheterization on 05/22/2018 showing a high-grade proximal LAD stenosis that was treated with a drug-eluting stent.  There is also 80% stenosis of a intermediate branch that was untreated.  She said that her symptoms resolved after the cardiac catheterization and she felt fairly well until about 6 weeks ago when she developed recurrent exertional substernal chest discomfort as well as shortness of breath.  The symptoms have progressed and are now occurring daily and waking her up from sleep.  It is gotten the point where just walking across the room will call substernal chest discomfort.  She underwent cardiac catheterization today which showed a 95% in-stent restenosis within the proximal LAD stent.  It was noted that there was an area of incomplete reexpansion of the initial stent which is the likely reason for restenosis.  She also has 95% proximal stenosis of a moderate sized ramus branch and a 95% stenosis of a moderate sized obtuse marginal branch.  The right coronary artery has no significant disease.  2D echocardiogram has been done and shows normal left ventricular systolic function with no significant valvular abnormality.  The patient said she had slight chest pain after the catheterization today and was given sublingual nitroglycerin.  Past Medical History:   Diagnosis Date  . Anemia     mild as per PCP  . Arthritis    hip/ s/p recent shoulder fracture 8/12- RIGHT  . Asthma   . Coronary artery disease 05/30/2018   LAD PCI/DES  . GI bleeding   . Hiatal hernia   . Humerus fracture    with wrist on right side  . Hypertension    hypercholesterolemia/  EKG on chart with clearance and note 06/18/11  Mazzocchi  . Pneumonia     Past Surgical History:  Procedure Laterality Date  . ABDOMINAL HYSTERECTOMY    . COLECTOMY WITH COLOSTOMY CREATION/HARTMANN PROCEDURE N/A 05/07/2016   Procedure: COLOSTOMY CREATION/HARTMANN PROCEDURE;  Surgeon: Romie Levee, MD;  Location: [facility];  Service: General;  Laterality: N/A;  . COLONOSCOPY N/A 08/26/2016   Procedure: COLONOSCOPY;  Surgeon: Romie Levee, MD;  Location: [facility];  Service: Endoscopy;  Laterality: N/A;  . COLOSTOMY TAKEDOWN N/A 08/27/2016   Procedure: LAPAROSCOPIC COLOSTOMY REVERSAL;  Surgeon: Romie Levee, MD;  Location: [facility];  Service: General;  Laterality: N/A;  . CORONARY STENT INTERVENTION N/A 05/30/2018   Procedure: CORONARY STENT INTERVENTION;  Surgeon: Runell Gess, MD;  Location: [facility];  Service: Cardiovascular;  Laterality: N/A;  . EYE SURGERY     eyelid tuck bilateral  . LAPAROTOMY N/A 05/07/2016   Procedure: EXPLORATORY LAPAROTOMY, LYLSIS OF ADHSIONS, EVACATION OF PERITONEAL ABSCESS;  Surgeon: Romie Levee, MD;  Location: [facility];  Service: General;  Laterality: N/A;  . LEFT HEART CATH AND CORONARY ANGIOGRAPHY N/A 05/30/2018  Procedure: LEFT HEART CATH AND CORONARY ANGIOGRAPHY;  Surgeon: Runell GessBerry, Jonathan J, MD;  Location: [facility];  Service: Cardiovascular;  Laterality: N/A;  . LEFT HEART CATH AND CORONARY ANGIOGRAPHY N/A 11/17/2018   Procedure: LEFT HEART CATH AND CORONARY ANGIOGRAPHY;  Surgeon: Runell GessBerry, Jonathan J, MD;  Location: [facility];  Service: Cardiovascular;  Laterality: N/A;  . PARATHYROIDECTOMY     one lobe- benign per pt  .  TONSILLECTOMY    . TOTAL HIP ARTHROPLASTY  07/08/2011   Procedure: TOTAL HIP ARTHROPLASTY;  Surgeon: Gus RankinFrank V Aluisio;  Location: [facility];  Service: Orthopedics;  Laterality: Right;    Family History  Problem Relation Age of Onset  . CAD Father 6740       PCI    Social History:  reports that she has never smoked. She has never used smokeless tobacco. She reports current alcohol use. She reports that she does not use drugs.  Allergies:  Allergies  Allergen Reactions  . Penicillins Other (See Comments)    Unknown reaction as a child. Tolerated Zosyn 2017.  Marland Kitchen. Morphine And Related Anxiety  . Pregabalin Anxiety    Medications:  I have reviewed the patient's current medications. Prior to Admission:  Medications Prior to Admission  Medication Sig Dispense Refill Last Dose  . aspirin EC 81 MG tablet Take 81 mg by mouth daily.   11/17/2018 at 0430  . atorvastatin (LIPITOR) 80 MG tablet Take 1 tablet (80 mg total) by mouth daily at 6 PM. 90 tablet 2 11/17/2018 at 0430  . carvedilol (COREG) 3.125 MG tablet TAKE 1 TABLET BY MOUTH TWICE DAILY WITH A MEAL (Patient taking differently: Take 3.125 mg by mouth 2 (two) times daily with a meal. ) 60 tablet 11 11/17/2018 at 0430  . cetirizine (ZYRTEC) 10 MG tablet Take 10 mg by mouth at bedtime.    11/16/2018 at 2130  . Cholecalciferol (VITAMIN D PO) Take 1,000 Units by mouth daily.    11/16/2018 at 1030  . clopidogrel (PLAVIX) 75 MG tablet Take 1 tablet (75 mg total) by mouth daily with breakfast. 90 tablet 2 11/17/2018 at 0430  . ferrous sulfate (FERROUSUL) 325 (65 FE) MG tablet Take 1 tablet (325 mg total) by mouth 2 (two) times daily with a meal. 180 tablet 2 11/17/2018 at 0430  . isosorbide mononitrate (IMDUR) 30 MG 24 hr tablet Take 0.5 tablets (15 mg total) by mouth daily. (Patient taking differently: Take 60 mg by mouth daily. ) 90 tablet 1 11/17/2018 at 0430  . lisinopril (PRINIVIL,ZESTRIL) 40 MG tablet Take 40 mg by mouth daily.   11/17/2018 at 0430  .  MAGNESIUM OXIDE PO Take 125 mg by mouth daily.    11/17/2018 at 0430  . nitroGLYCERIN (NITROSTAT) 0.4 MG SL tablet Place 1 tablet (0.4 mg total) under the tongue every 5 (five) minutes as needed. 25 tablet 2 11/16/2018 at 1530  . oxyCODONE-acetaminophen (PERCOCET) 10-325 MG tablet Take 1 tablet by mouth 4 (four) times daily.    11/17/2018 at 0430  . pantoprazole (PROTONIX) 40 MG tablet Take 1 tablet (40 mg total) by mouth 2 (two) times daily before a meal. 30 tablet 0 11/17/2018 at 0430  . polyethylene glycol (MIRALAX / GLYCOLAX) packet Take 17 g by mouth 2 (two) times daily. (Patient taking differently: Take 17 g by mouth at bedtime as needed for moderate constipation. ) 14 each 0 Past Week at Unknown time  . venlafaxine XR (EFFEXOR-XR) 75 MG 24 hr capsule Take  225 mg by mouth at bedtime.   11/16/2018 at 2130  . VENTOLIN HFA 108 (90 Base) MCG/ACT inhaler Inhale 2 puffs into the lungs every 4 (four) hours as needed for wheezing or shortness of breath.    Past Month at Unknown time   Scheduled: . aspirin  81 mg Oral Daily  . [START ON 11/19/2018] aspirin EC  81 mg Oral Daily  . atorvastatin  80 mg Oral q1800  . carvedilol  3.125 mg Oral BID WC  . ferrous sulfate  325 mg Oral BID WC  . [START ON 11/18/2018] lisinopril  40 mg Oral Daily  . mupirocin ointment  1 application Nasal BID  . pantoprazole  40 mg Oral BID AC  . polyethylene glycol  17 g Oral BID  . sodium chloride flush  3 mL Intravenous Q12H  . venlafaxine XR  225 mg Oral QHS   Continuous: . sodium chloride     JWL:KHVFMB chloride, acetaminophen, albuterol, hydrALAZINE, labetalol, nitroGLYCERIN, ondansetron (ZOFRAN) IV, sodium chloride flush  Results for orders placed or performed during the hospital encounter of 11/17/18 (from the past 48 hour(s))  CBC     Status: None   Collection Time: 11/17/18  6:10 AM  Result Value Ref Range   WBC 7.5 4.0 - 10.5 K/uL   RBC 4.43 3.87 - 5.11 MIL/uL   Hemoglobin 13.8 12.0 - 15.0 g/dL   HCT 34.0  37.0 - 96.4 %   MCV 93.7 80.0 - 100.0 fL   MCH 31.2 26.0 - 34.0 pg   MCHC 33.3 30.0 - 36.0 g/dL   RDW 38.3 81.8 - 40.3 %   Platelets 178 150 - 400 K/uL   nRBC 0.0 0.0 - 0.2 %    Comment: Performed at [facility], [address] [place], [place] [address]  Basic metabolic panel     Status: Abnormal   Collection Time: 11/17/18  6:10 AM  Result Value Ref Range   Sodium 141 135 - 145 mmol/L   Potassium 4.0 3.5 - 5.1 mmol/L   Chloride 104 98 - 111 mmol/L   CO2 26 22 - 32 mmol/L   Glucose, Bld 114 (H) 70 - 99 mg/dL   BUN 27 (H) 8 - 23 mg/dL   Creatinine, Ser 0.67 0.44 - 1.00 mg/dL   Calcium 9.6 8.9 - 70.3 mg/dL   GFR calc non Af Amer >60 >60 mL/min   GFR calc Af Amer >60 >60 mL/min   Anion gap 11 5 - 15    Comment: Performed at [facility], [address], [place], [place] [address]    No results found.  Review of Systems  Constitutional: Positive for malaise/fatigue. Negative for chills, fever and weight loss.  HENT: Negative.   Eyes: Negative.   Respiratory: Positive for shortness of breath.   Cardiovascular: Positive for chest pain. Negative for palpitations, orthopnea, leg swelling and PND.  Genitourinary: Negative.   Musculoskeletal: Negative.   Skin: Negative.   Neurological: Negative.  Negative for dizziness and focal weakness.  Endo/Heme/Allergies: Negative.   Psychiatric/Behavioral: Negative.    Blood pressure 140/73, pulse 83, temperature 97.9 F (36.6 C), temperature source Oral, resp. rate 19, height 5\' 5"  (1.651 m), weight 83.1 kg, SpO2 94 %. Physical Exam  Constitutional: She is oriented to person, place, and time. She appears well-developed and well-nourished. No distress.  HENT:  Head: Normocephalic and atraumatic.  Mouth/Throat: Oropharynx is clear and moist.  Eyes: Pupils are equal, round, and reactive to  light. Conjunctivae and EOM are normal.  Neck: Normal range of motion. Neck supple. No JVD present.  Cardiovascular: Normal rate, regular  rhythm, normal heart sounds and intact distal pulses.  No murmur heard. Respiratory: Effort normal and breath sounds normal. No respiratory distress. She has no rales.  GI: Soft. There is no abdominal tenderness.  Musculoskeletal:        General: No edema.  Lymphadenopathy:    She has no cervical adenopathy.  Neurological: She is alert and oriented to person, place, and time.  Skin: Skin is warm and dry.  Psychiatric: She has a normal mood and affect.   Physicians   Panel Physicians Referring Physician Case Authorizing Physician  Runell Gess, MD (Primary)    Procedures   LEFT HEART CATH AND CORONARY ANGIOGRAPHY  Conclusion     Ost LAD to Prox LAD lesion is 95% stenosed.  Lat Ramus lesion is 90% stenosed.  Ramus lesion is 90% stenosed.   EVI MCCOMB is a 78 y.o. female    [id number] LOCATION:  FACILITY: [facility]  PHYSICIAN: [name], M.D. 1941-05-24   DATE OF PROCEDURE:  11/17/2018  DATE OF DISCHARGE:     CARDIAC CATHETERIZATION     History obtained from chart review.Melissaann T Appenzelleris [phone].o.moderately overweight widowed Caucasian female mother of 2, grandmother and 2 grandchildren's husband [name] was a long-term patient of mine who died August 27, 2016. 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 Shewasreferred by [name] nurse practitioner for a symptomatic sinus tachycardia.I last saw her in the office12/06/2018.Marland Kitchen Hercardiac risk factors include treated hypertension and hyperlipidemia patient is a family history of heart disease with father who died of a myocardial function and age 22. She has never had a heart attack or stroke. She denies chest pain or shortness of breath. She does exercise and does yoga as well. She has reactive airways disease. She is noted to be tachycardic by her PCP was referred here for further evaluation.  When I saw her in the office10/25/2019 she was complaining of increasing dyspnea  and exertional chest pain. I performed radial diagnostic cath 05/22/2018 revealing high-grade calcified proximal LAD stenosis. I stented her with a 2.5 mm x 20 mm long Synergy drug-eluting stent however there was a small area that would not completely expand which left her with a fairly focal 50% stenosis within the dilated segment. She also had an 80% small ramus branch stenosis which was untreated. Her angina has resolved her dyspnea has improved. She was significantly anemic with a hemoglobin of 8.1 during her hospitalization which is improved with iron repletion up to the low 9 range.  She had done well until earlier this month when she started developing crescendo angina. She was seen by Dr. Doreene Adas on 11/04/2018 who adjusted her medications and increased her long-acting oral nitrate. She continues to have effort angina which is fairly reproducible as well as some unstable symptoms at night. I suspect her proximal LAD stent has restenosed and that her best option would be CABG. She is on aspirin and Plavix. I am arranging for her to undergo diagnostic coronary angiography by myself this coming Thursday.   IMPRESSION: Ms. Wojtaszek has aggressive proximal LAD in-stent restenosis not unexpected given the failure to expand completely during her initial procedure 7 months ago.  In addition, she has high-grade disease in a ramus branch and a high first marginal branch.  Her dominant right coronary artery is free of disease and she had normal LV function at the  time of her last procedure.  She has had accelerated angina.  At this point, I believe the best option is CABG.  She has been on aspirin Plavix.  She will need Plavix washout in the hospital since she has had accelerated symptoms.  The sheath was removed and a TR band was placed on the right wrist to achieve patent hemostasis.  The patient left lab in stable condition.  I have notified her daughter of Ms. Joost status (Terrill Tiburcio Pea).   [name]. MD, [facility] 11/17/2018 8:26 AM      Recommendations   Antiplatelet/Anticoag Recommend Aspirin 81mg  daily for moderate CAD.  Indications   Unstable angina (HCC) [I20.0 (ICD-10-CM)]  Procedural Details   Technical Details PROCEDURE DESCRIPTION:   The patient was brought to the second floor  Cardiac cath lab in the postabsorptive state.  She was premedicated with IV Versed and fentanyl.  Her right wristwas prepped and shaved in usual sterile fashion. Xylocaine 1% was used for local anesthesia. A 6 French sheath was inserted into the right radial artery using standard Seldinger technique. The patient received 4000 units  of heparin intravenously.  A 5 [place] TIG catheter was used for selective coronary angiography.  Left ventriculography was not performed.  Isovue dye was used for the entirety of the case.  Retrograde aortic pressure was monitored during the case.  Radial cocktail was administered via the SideArm sheath. Estimated blood loss <50 mL.   During this procedure medications were administered to achieve and maintain moderate conscious sedation while the patient's heart rate, blood pressure, and oxygen saturation were continuously monitored and I was present face-to-face 100% of this time.  Medications  (Filter: Administrations occurring from 11/17/18 0725 to 11/17/18 1610)  Medication Rate/Dose/Volume Action  Date Time   Heparin (Porcine) in NaCl 1000-0.9 UT/500ML-% SOLN (mL) 500 mL Given 11/17/18 0728   Total dose as of 11/17/18 1433 500 mL Given 0728   1,000 mL        fentaNYL (SUBLIMAZE) injection (mcg) 25 mcg Given 11/17/18 0738   Total dose as of 11/17/18 1433        25 mcg        midazolam (VERSED) injection (mg) 1 mg Given 11/17/18 0739   Total dose as of 11/17/18 1433        1 mg        Radial Cocktail (Verapamil 2.5 mg, NTG, Lidocaine) (mL) 15 mL Canceled Entry 11/17/18 0752   Total dose as of 11/17/18 1433 15 mL Given 0753   15 mL         heparin injection (Units) 4,000 Units Given 11/17/18 0756   Total dose as of 11/17/18 1433        4,000 Units        iohexol (OMNIPAQUE) 350 MG/ML injection (mL) 30 mL Given 11/17/18 0805   Total dose as of 11/17/18 1433        30 mL        nitroGLYCERIN (NITROSTAT) SL tablet (mg) 0.4 mg Given 11/17/18 0812   Total dose as of 11/17/18 1433        0.4 mg        Sedation Time   Sedation Time Physician-1: 24 minutes 13 seconds  Coronary Findings   Diagnostic  Dominance: Right  Left Anterior Descending  Ost LAD to Prox LAD lesion 95% stenosed  Ost LAD to Prox LAD lesion is 95% stenosed. The lesion is calcified. The lesion was previously treated using  a drug eluting stent between 6-12 months ago. Previously placed stent displays restenosis.  Ramus Intermedius  Ramus lesion 90% stenosed  Ramus lesion is 90% stenosed.  Lateral Ramus Intermedius  Lat Ramus lesion 90% stenosed  Lat Ramus lesion is 90% stenosed.  Intervention   No interventions have been documented.  Coronary Diagrams   Diagnostic  Dominance: Right    Intervention    Assessment/Plan:  This 78 year old woman has high-grade proximal LAD restenosis within a prior drug-eluting stent most likely secondary to focal incomplete reexpansion within the stent.  She also has high-grade stenosis of a moderate sized ramus and a moderate sized obtuse marginal branch.  She has unstable anginal symptoms.  I agree that coronary bypass graft surgery is the best treatment without further ischemia and infarction and improve her quality of life and prognosis.  She has been on Plavix since her stent and that was discontinued as of today.  She will require a several day period of Plavix washout and I will plan on proceeding with coronary bypass graft surgery on Monday. I discussed the operative procedure with the patient and family including alternatives, benefits and risks; including but not limited to bleeding, blood transfusion, infection,  stroke, myocardial infarction, graft failure, heart block requiring a permanent pacemaker, organ dysfunction, and death.  [name] understands and agrees to proceed. I called and talked with her son, Lorin Picket, to explain everything to him and he is in agreement.  I spent 40 minutes performing this consultation and > 50% of this time was spent face to face counseling and coordinating the care of this patient's severe two-vessel coronary disease.    Alleen Borne 11/17/2018, 2:23 PM

## 2018-11-17 NOTE — Progress Notes (Signed)
TCTS consulted for CABG evaluation. °

## 2018-11-17 NOTE — Interval H&P Note (Signed)
Cath Lab Visit (complete for each Cath Lab visit)  Clinical Evaluation Leading to the Procedure:   ACS: No.  Non-ACS:    Anginal Classification: CCS II  Anti-ischemic medical therapy: Maximal Therapy (2 or more classes of medications)  Non-Invasive Test Results: No non-invasive testing performed  Prior CABG: No previous CABG      History and Physical Interval Note:  11/17/2018 7:34 AM  Monique [name]  has presented today for surgery, with the diagnosis of angina.  The various methods of treatment have been discussed with the patient and family. After consideration of risks, benefits and other options for treatment, the patient has consented to  Procedure(s): LEFT HEART CATH AND CORONARY ANGIOGRAPHY (N/A) as a surgical intervention.  The patient's history has been reviewed, patient examined, no change in status, stable for surgery.  I have reviewed the patient's chart and labs.  Questions were answered to the patient's satisfaction.     Monique [name]

## 2018-11-18 ENCOUNTER — Inpatient Hospital Stay (HOSPITAL_COMMUNITY): Payer: Medicare Other

## 2018-11-18 DIAGNOSIS — Z01810 Encounter for preprocedural cardiovascular examination: Secondary | ICD-10-CM

## 2018-11-18 DIAGNOSIS — I10 Essential (primary) hypertension: Secondary | ICD-10-CM

## 2018-11-18 DIAGNOSIS — I200 Unstable angina: Secondary | ICD-10-CM

## 2018-11-18 DIAGNOSIS — E782 Mixed hyperlipidemia: Secondary | ICD-10-CM

## 2018-11-18 LAB — HEMOGLOBIN A1C
Hgb A1c MFr Bld: 6.1 % — ABNORMAL HIGH (ref 4.8–5.6)
Mean Plasma Glucose: 128.37 mg/dL

## 2018-11-18 LAB — CBC
HCT: 36.6 % (ref 36.0–46.0)
HCT: 37.8 % (ref 36.0–46.0)
Hemoglobin: 12.2 g/dL (ref 12.0–15.0)
Hemoglobin: 12.4 g/dL (ref 12.0–15.0)
MCH: 31 pg (ref 26.0–34.0)
MCH: 30.2 pg (ref 26.0–34.0)
MCHC: 33.3 g/dL (ref 30.0–36.0)
MCHC: 32.8 g/dL (ref 30.0–36.0)
MCV: 92.9 fL (ref 80.0–100.0)
MCV: 92 fL (ref 80.0–100.0)
Platelets: 146 10*3/uL — ABNORMAL LOW (ref 150–400)
Platelets: 153 10*3/uL (ref 150–400)
RBC: 3.94 MIL/uL (ref 3.87–5.11)
RBC: 4.11 MIL/uL (ref 3.87–5.11)
RDW: 13.8 % (ref 11.5–15.5)
RDW: 13.9 % (ref 11.5–15.5)
WBC: 5.9 10*3/uL (ref 4.0–10.5)
WBC: 6.2 10*3/uL (ref 4.0–10.5)
nRBC: 0 % (ref 0.0–0.2)
nRBC: 0 % (ref 0.0–0.2)

## 2018-11-18 LAB — BASIC METABOLIC PANEL
Anion gap: 5 (ref 5–15)
BUN: 16 mg/dL (ref 8–23)
CO2: 30 mmol/L (ref 22–32)
Calcium: 9.2 mg/dL (ref 8.9–10.3)
Chloride: 107 mmol/L (ref 98–111)
Creatinine, Ser: 0.55 mg/dL (ref 0.44–1.00)
GFR calc Af Amer: >60 mL/min (ref >60)
GFR calc non Af Amer: >60 mL/min (ref >60)
Glucose, Bld: 147 mg/dL — ABNORMAL HIGH (ref 70–99)
Potassium: 3.9 mmol/L (ref 3.5–5.1)
Sodium: 142 mmol/L (ref 135–145)

## 2018-11-18 LAB — HEPARIN LEVEL (UNFRACTIONATED): Heparin Unfractionated: <0.1 IU/mL — ABNORMAL LOW (ref 0.30–0.70)

## 2018-11-18 MED ORDER — HEPARIN BOLUS VIA INFUSION
2000.0000 [IU] | Freq: Once | INTRAVENOUS | Status: AC
  Administered 2018-11-18: 2000 [IU] via INTRAVENOUS
  Filled 2018-11-18: qty 2000

## 2018-11-18 MED ORDER — NITROGLYCERIN IN D5W 200-5 MCG/ML-% IV SOLN
2.0000 ug/min | INTRAVENOUS | Status: DC
  Administered 2018-11-18: 5 ug/min via INTRAVENOUS
  Administered 2018-11-20: 20 ug/min via INTRAVENOUS
  Administered 2018-11-20: 15 ug/min via INTRAVENOUS
  Administered 2018-11-20: 40 ug/min via INTRAVENOUS
  Administered 2018-11-20: 35 ug/min via INTRAVENOUS
  Administered 2018-11-20: 25 ug/min via INTRAVENOUS
  Administered 2018-11-20: 30 ug/min via INTRAVENOUS
  Filled 2018-11-18 (×2): qty 250

## 2018-11-18 MED ORDER — HEPARIN (PORCINE) 25000 UT/250ML-% IV SOLN
950.0000 [IU]/h | INTRAVENOUS | Status: DC
  Administered 2018-11-18: 1050 [IU]/h via INTRAVENOUS
  Administered 2018-11-19: 1150 [IU]/h via INTRAVENOUS
  Filled 2018-11-18 (×3): qty 250

## 2018-11-18 MED ORDER — CARVEDILOL 6.25 MG PO TABS
6.2500 mg | ORAL_TABLET | Freq: Two times a day (BID) | ORAL | Status: DC
  Administered 2018-11-18 – 2018-11-20 (×5): 6.25 mg via ORAL
  Filled 2018-11-18 (×5): qty 1

## 2018-11-18 MED ORDER — KETOTIFEN FUMARATE 0.025 % OP SOLN
1.0000 [drp] | Freq: Two times a day (BID) | OPHTHALMIC | Status: DC
  Administered 2018-11-18 – 2018-11-27 (×18): 1 [drp] via OPHTHALMIC
  Filled 2018-11-18 (×2): qty 5

## 2018-11-18 MED FILL — Nitroglycerin IV Soln 100 MCG/ML in D5W: INTRA_ARTERIAL | Qty: 10 | Status: AC

## 2018-11-18 NOTE — Progress Notes (Addendum)
Progress Note  Patient Name: Monique [name] T Ferraris Date of Encounter: 11/18/2018  Primary Cardiologist: Nanetta BattyJonathan Berry, MD   Subjective   She is having on and off chest pain that are mostly relieved by nitro patch.  Inpatient Medications    Scheduled Meds: . aspirin  81 mg Oral Daily  . [START ON 11/19/2018] aspirin EC  81 mg Oral Daily  . atorvastatin  80 mg Oral q1800  . carvedilol  3.125 mg Oral BID WC  . ferrous sulfate  325 mg Oral BID WC  . lisinopril  40 mg Oral Daily  . mupirocin ointment  1 application Nasal BID  . nitroGLYCERIN  0.5 inch Topical Q6H  . pantoprazole  40 mg Oral BID AC  . polyethylene glycol  17 g Oral BID  . sodium chloride flush  3 mL Intravenous Q12H  . venlafaxine XR  225 mg Oral QHS   Continuous Infusions: . sodium chloride     PRN Meds: sodium chloride, acetaminophen, albuterol, nitroGLYCERIN, ondansetron (ZOFRAN) IV, sodium chloride flush   Vital Signs    Vitals:   11/17/18 1210 11/17/18 2100 11/18/18 0611 11/18/18 0801  BP: 140/73 (!) 142/81 (!) 146/76 (!) 170/77  Pulse: 83 89 74 84  Resp: 19     Temp: 97.9 F (36.6 C) 98.4 F (36.9 C) 98 F (36.7 C)   TempSrc: Oral Oral Oral   SpO2: 94% 100% 95%   Weight:   83.5 kg   Height:        Intake/Output Summary (Last 24 hours) at 11/18/2018 1031 Last data filed at 11/17/2018 1535 Gross per 24 hour  Intake 0 ml  Output -  Net 0 ml   Last 3 Weights 11/18/2018 11/17/2018 11/17/2018  Weight (lbs) 184 lb 183 lb 4.8 oz 180 lb  Weight (kg) 83.462 kg 83.144 kg 81.647 kg      Telemetry    SR - Personally Reviewed  ECG    No new tracing- Personally Reviewed  Physical Exam   GEN: No acute distress.   Neck: No JVD Cardiac: RRR, no murmurs, rubs, or gallops.  Respiratory: Clear to auscultation bilaterally. GI: Soft, nontender, non-distended  MS: No edema; No deformity. Neuro:  Nonfocal  Psych: Normal affect   Labs    Chemistry Recent Labs  Lab 11/17/18 0610 11/18/18 0342   NA 141 142  K 4.0 3.9  CL 104 107  CO2 26 30  GLUCOSE 114* 147*  BUN 27* 16  CREATININE 0.68 0.55  CALCIUM 9.6 9.2  GFRNONAA >60 >60  GFRAA >60 >60  ANIONGAP 11 5     Hematology Recent Labs  Lab 11/17/18 0610 11/18/18 0342  WBC 7.5 5.9  RBC 4.43 3.94  HGB 13.8 12.2  HCT 41.5 36.6  MCV 93.7 92.9  MCH 31.2 31.0  MCHC 33.3 33.3  RDW 13.6 13.8  PLT 178 146*    Cardiac EnzymesNo results for input(s): TROPONINI in the last 168 hours. No results for input(s): TROPIPOC in the last 168 hours.   BNPNo results for input(s): BNP, PROBNP in the last 168 hours.   DDimer No results for input(s): DDIMER in the last 168 hours.   Radiology    No results found.  Cardiac Studies   Cardiac catheterization 11/17/2018   Ost LAD to Prox LAD lesion is 95% stenosed.  Lat Ramus lesion is 90% stenosed.  Ramus lesion is 90% stenosed.   Echocardiogram 11/17/2018 1. The left ventricle has hyperdynamic systolic function, with an ejection  fraction of >65%. The cavity size was normal. There is mild concentric left ventricular hypertrophy. Left ventricular diastolic Doppler parameters are consistent with impaired  relaxation. No evidence of left ventricular regional wall motion abnormalities.  2. The right ventricle has normal systolic function. The cavity was normal. There is no increase in right ventricular wall thickness.  3. The aortic valve is tricuspid. Mild sclerosis of the aortic valve. Aortic valve regurgitation was not assessed by color flow Doppler.  4. The interatrial septum appears to be lipomatous.  Patient Profile     78 y.o. female   Assessment & Plan    1.  CAD -status post cath yesterday that showed has high-grade proximal LAD restenosis within a prior drug-eluting stent most likely secondary to focal incomplete reexpansion within the stent.  She also has high-grade stenosis of a moderate sized ramus and a moderate sized obtuse marginal branch.  She has unstable anginal  symptoms. -Evaluated by Dr. Virgel Gess and bypass surgery scheduled for Monday November 21, 2018 -LVEF is preserved on echocardiogram and there are no significant valvular abnormalities. -She is having on and off chest pain, I will start nitroglycerin drip that can be titrated - I would start on heparin drip  2.    Hypertension -I would increase carvedilol to 6.25 mg p.o. twice daily  3.  Hyperlipidemia -Continue high-dose statin   For questions or updates, please contact [facility] Please consult [email or web address] for contact info under        Signed, Tobias Alexander, MD  11/18/2018, 10:31 AM

## 2018-11-18 NOTE — Progress Notes (Signed)
Pre-CABG Dopplers completed. Refer to "CV Proc" under chart review to view preliminary results.  11/18/2018 11:53 AM Gertie Fey, MHA, RVT, RDCS, RDMS

## 2018-11-18 NOTE — Progress Notes (Signed)
Patient called out with complaints of 7/10 chest pain. I came into room. She stated that she was having mid sternal chest pain with radiation to her arms. Blood pressure 137/57. I got an EKG, increased nitro gtt to 3cc/hr with relief of her chest pain completely after 5 minutes after the increase. I notified Debby Bud, patients nurse of situation. Plan to call MD. Patient made aware to call if chest pain returns

## 2018-11-18 NOTE — Progress Notes (Signed)
ANTICOAGULATION CONSULT NOTE - Initial Consult  Pharmacy Consult for Heparin  Indication:  ACS, chest pain  Allergies  Allergen Reactions  . Penicillins Other (See Comments)    Unknown reaction as a child. Tolerated Zosyn 2017.  Marland Kitchen Morphine And Related Anxiety  . Pregabalin Anxiety    Patient Measurements: Height: 5\' 5"  (165.1 cm) Weight: 184 lb (83.5 kg) IBW/kg (Calculated) : 57 Heparin Dosing Weight: 75 kg  Vital Signs: Temp: 98 F (36.7 C) (04/17 0611) Temp Source: Oral (04/17 0611) BP: 150/80 (04/17 1105) Pulse Rate: 84 (04/17 0801)  Labs: Recent Labs    11/17/18 0610 11/18/18 0342  HGB 13.8 12.2  HCT 41.5 36.6  PLT 178 146*  CREATININE 0.68 0.55    Estimated Creatinine Clearance: 60.9 mL/min (by C-G formula based on SCr of 0.55 mg/dL).   Medical History: Past Medical History:  Diagnosis Date  . Anemia     mild as per PCP  . Arthritis    hip/ s/p recent shoulder fracture 8/12- RIGHT  . Asthma   . Coronary artery disease 05/30/2018   LAD PCI/DES  . GI bleeding   . Hiatal hernia   . Humerus fracture    with wrist on right side  . Hypertension    hypercholesterolemia/  EKG on chart with clearance and note 06/18/11  Mazzocchi  . Pneumonia     Medications:  Medications Prior to Admission  Medication Sig Dispense Refill Last Dose  . aspirin EC 81 MG tablet Take 81 mg by mouth daily.   11/17/2018 at 0430  . atorvastatin (LIPITOR) 80 MG tablet Take 1 tablet (80 mg total) by mouth daily at 6 PM. 90 tablet 2 11/17/2018 at 0430  . carvedilol (COREG) 3.125 MG tablet TAKE 1 TABLET BY MOUTH TWICE DAILY WITH A MEAL (Patient taking differently: Take 3.125 mg by mouth 2 (two) times daily with a meal. ) 60 tablet 11 11/17/2018 at 0430  . cetirizine (ZYRTEC) 10 MG tablet Take 10 mg by mouth at bedtime.    11/16/2018 at 2130  . Cholecalciferol (VITAMIN D PO) Take 1,000 Units by mouth daily.    11/16/2018 at 1030  . clopidogrel (PLAVIX) 75 MG tablet Take 1 tablet (75 mg  total) by mouth daily with breakfast. 90 tablet 2 11/17/2018 at 0430  . ferrous sulfate (FERROUSUL) 325 (65 FE) MG tablet Take 1 tablet (325 mg total) by mouth 2 (two) times daily with a meal. 180 tablet 2 11/17/2018 at 0430  . isosorbide mononitrate (IMDUR) 30 MG 24 hr tablet Take 0.5 tablets (15 mg total) by mouth daily. (Patient taking differently: Take 60 mg by mouth daily. ) 90 tablet 1 11/17/2018 at 0430  . lisinopril (PRINIVIL,ZESTRIL) 40 MG tablet Take 40 mg by mouth daily.   11/17/2018 at 0430  . MAGNESIUM OXIDE PO Take 125 mg by mouth daily.    11/17/2018 at 0430  . nitroGLYCERIN (NITROSTAT) 0.4 MG SL tablet Place 1 tablet (0.4 mg total) under the tongue every 5 (five) minutes as needed. 25 tablet 2 11/16/2018 at 1530  . oxyCODONE-acetaminophen (PERCOCET) 10-325 MG tablet Take 1 tablet by mouth 4 (four) times daily.    11/17/2018 at 0430  . pantoprazole (PROTONIX) 40 MG tablet Take 1 tablet (40 mg total) by mouth 2 (two) times daily before a meal. 30 tablet 0 11/17/2018 at 0430  . polyethylene glycol (MIRALAX / GLYCOLAX) packet Take 17 g by mouth 2 (two) times daily. (Patient taking differently: Take 17 g by mouth at  bedtime as needed for moderate constipation. ) 14 each 0 Past Week at Unknown time  . venlafaxine XR (EFFEXOR-XR) 75 MG 24 hr capsule Take 225 mg by mouth at bedtime.   11/16/2018 at 2130  . VENTOLIN HFA 108 (90 Base) MCG/ACT inhaler Inhale 2 puffs into the lungs every 4 (four) hours as needed for wheezing or shortness of breath.    Past Month at Unknown time    Assessment: 78 y.o female with h/o CAD s/p PCI, DES to proximal LAD 05/2018.  Now S/p cardiac cath on 11/17/18 >revealed severe 2vCAD, including LAD restenosis.  TCTS consulted plan for CABG 11/21/18.  Today patient is having off and on chest pain 4/17, thus cardiologist has order to start IV heparin.  She was not on anticoagulation PTA.   Hgb 12.2, pltc 178>146k. Renal function normal, SCr <1.0.   Goal of Therapy:  Heparin  level 0.3-0.7 units/ml Monitor platelets by anticoagulation protocol: Yes   Plan:  Start IV heparin drip at 1050 units/hr, no bolus due recent cardiac cath.  Check heparin level in 8 hours Daily HL, CBC  Thank you for allowing pharmacy to be part of this patients care team.  Noah Delaineuth Mendell Bontempo, RPh Clinical Pharmacist Pager: [phone] Please check AMION for all [facility] phone numbers After 10:00 PM, call Main Pharmacy [phone] 11/18/2018,11:31 AM

## 2018-11-18 NOTE — Progress Notes (Signed)
ANTICOAGULATION CONSULT NOTE   Pharmacy Consult for Heparin  Indication:  ACS, chest pain  Allergies  Allergen Reactions  . Penicillins Other (See Comments)    UNSPECIFIED REACTION  Unknown reaction as a child.  Tolerated Zosyn 2017.  Marland Kitchen Morphine And Related Anxiety  . Pregabalin Anxiety    Patient Measurements: Height: 5\' 5"  (165.1 cm) Weight: 184 lb (83.5 kg) IBW/kg (Calculated) : 57 Heparin Dosing Weight: 75 kg  Vital Signs: Temp: 98 F (36.7 C) (04/17 1415) Temp Source: Oral (04/17 1415) BP: 137/57 (04/17 1747) Pulse Rate: 84 (04/17 1640)  Labs: Recent Labs    11/17/18 0610 11/18/18 0342 11/18/18 2006  HGB 13.8 12.2 12.4  HCT 41.5 36.6 37.8  PLT 178 146* 153  HEPARINUNFRC  --   --  <0.10*  CREATININE 0.68 0.55  --     Estimated Creatinine Clearance: 60.9 mL/min (by C-G formula based on SCr of 0.55 mg/dL).   Medical History: Past Medical History:  Diagnosis Date  . Anemia     mild as per PCP  . Arthritis    hip/ s/p recent shoulder fracture 8/12- RIGHT  . Asthma   . Coronary artery disease 05/30/2018   LAD PCI/DES  . GI bleeding   . Hiatal hernia   . Humerus fracture    with wrist on right side  . Hypertension    hypercholesterolemia/  EKG on chart with clearance and note 06/18/11  Mazzocchi  . Pneumonia     Assessment: 78 y.o female with h/o CAD s/p PCI, DES to proximal LAD 05/2018.  Now S/p cardiac cath on 11/17/18 >revealed severe 2vCAD, including LAD restenosis.  TCTS consulted plan for CABG 11/21/18.  Today patient is having off and on chest pain 4/17, thus cardiologist has order to start IV heparin.  She was not on anticoagulation PTA.   Initial heparin level undetectable on 1050 units/hr. Repeat CBC stable. No bleeding or IV issues noted. Planning CABG Monday.  Goal of Therapy:  Heparin level 0.3-0.7 units/ml Monitor platelets by anticoagulation protocol: Yes   Plan:  Bolus heparin 2000 units x1 then increase rate to 1300/hr Daily HL,  CBC CABG on 4/20  Thank you for allowing pharmacy to be part of this patients care team.  Sheppard Coil PharmD., BCPS Clinical Pharmacist 11/18/2018 8:56 PM

## 2018-11-18 NOTE — Progress Notes (Signed)
1 Day Post-Op Procedure(s) (LRB): LEFT HEART CATH AND CORONARY ANGIOGRAPHY (N/A) Subjective: Had some chest pain last night towards the end of NTG patch dose and some today while walking. Now on NTG drip at 5 and heparin with no further pain.  Objective: Vital signs in last 24 hours: Temp:  [98 F (36.7 C)-98.4 F (36.9 C)] 98 F (36.7 C) (04/17 0611) Pulse Rate:  [74-89] 84 (04/17 0801) Cardiac Rhythm: Normal sinus rhythm (04/17 0701) BP: (142-170)/(76-81) 150/80 (04/17 1105) SpO2:  [95 %-100 %] 95 % (04/17 0611) Weight:  [83.5 kg] 83.5 kg (04/17 0611)  Hemodynamic parameters for last 24 hours:    Intake/Output from previous day: No intake/output data recorded. Intake/Output this shift: No intake/output data recorded.  General appearance: alert and cooperative Heart: regular rate and rhythm, S1, S2 normal, no murmur, click, rub or gallop Lungs: clear to auscultation bilaterally  Lab Results: Recent Labs    11/17/18 0610 11/18/18 0342  WBC 7.5 5.9  HGB 13.8 12.2  HCT 41.5 36.6  PLT 178 146*   BMET:  Recent Labs    11/17/18 0610 11/18/18 0342  NA 141 142  K 4.0 3.9  CL 104 107  CO2 26 30  GLUCOSE 114* 147*  BUN 27* 16  CREATININE 0.68 0.55  CALCIUM 9.6 9.2    PT/INR: No results for input(s): LABPROT, INR in the last 72 hours. ABG No results found for: PHART, HCO3, TCO2, ACIDBASEDEF, O2SAT CBG (last 3)  No results for input(s): GLUCAP in the last 72 hours.  Assessment/Plan:  Severe multivessel CAD. Carotid dopplers ok. Plan CABG Monday am after Plavix washout. Patient related that she has taken Oxycodone 5 mg 4 x per day for arthritis and had some withdrawal when it was stopped after her colon surgery. She says she did not have any pain relief with morphine or fentanyl. Told her I would keep all of that in mind after her surgery Monday. She has no further questions.   LOS: 1 day    Alleen Borne 11/18/2018

## 2018-11-18 NOTE — Progress Notes (Addendum)
Cardiac Rehab Advisory Cardiac Rehab Phase I is not seeing pts face to face at this time due to Covid 19 restrictions. Ambulation is occurring through nursing, PT, and mobility teams. We will help facilitate that process as needed. We are calling pts in their rooms and discussing education. We will then deliver education materials to pts RN for delivery to pt.  [id number] Discussed with pt the importance of IS and walking after surgery. Pt stated she is familiar with IS as her husband used one in the past. RN to give IS and have pt demonstrate correctly. Discussed sternal precautions and staying in the tube. Discussed that pt will need someone available first week home and she stated she had made arrangements. Encouraged her to watch pre op video. Pt given OHS booklet, care guide and in the tube handout and I wrote down how to view pre op video. Took materials upstairs and staff gave to pt. Pt voiced understanding. Wants to be referred back to CRP 2 when appropriate.  Will follow up after surgery. Did not encourage walking prior to surgery as pt stated having CP off and on.  Luetta Nutting RN BSN 11/18/2018 10:54 AM

## 2018-11-19 LAB — CBC
HCT: 36 % (ref 36.0–46.0)
Hemoglobin: 12 g/dL (ref 12.0–15.0)
MCH: 30.9 pg (ref 26.0–34.0)
MCHC: 33.3 g/dL (ref 30.0–36.0)
MCV: 92.8 fL (ref 80.0–100.0)
Platelets: 142 10*3/uL — ABNORMAL LOW (ref 150–400)
RBC: 3.88 MIL/uL (ref 3.87–5.11)
RDW: 13.9 % (ref 11.5–15.5)
WBC: 6.3 10*3/uL (ref 4.0–10.5)
nRBC: 0 % (ref 0.0–0.2)

## 2018-11-19 LAB — HEPARIN LEVEL (UNFRACTIONATED)
Heparin Unfractionated: 0.9 IU/mL — ABNORMAL HIGH (ref 0.30–0.70)
Heparin Unfractionated: 0.88 IU/mL — ABNORMAL HIGH (ref 0.30–0.70)
Heparin Unfractionated: 0.6 IU/mL (ref 0.30–0.70)

## 2018-11-19 MED ORDER — PHENYLEPHRINE HCL-NACL 20-0.9 MG/250ML-% IV SOLN
30.0000 ug/min | INTRAVENOUS | Status: AC
  Administered 2018-11-21: 5 ug/min via INTRAVENOUS
  Filled 2018-11-19: qty 250

## 2018-11-19 MED ORDER — NITROGLYCERIN IN D5W 200-5 MCG/ML-% IV SOLN
2.0000 ug/min | INTRAVENOUS | Status: DC
  Filled 2018-11-19: qty 250

## 2018-11-19 MED ORDER — EPINEPHRINE PF 1 MG/ML IJ SOLN
0.0000 ug/min | INTRAVENOUS | Status: DC
  Filled 2018-11-19: qty 4

## 2018-11-19 MED ORDER — VANCOMYCIN HCL 10 G IV SOLR
1250.0000 mg | INTRAVENOUS | Status: AC
  Administered 2018-11-21: 1250 mg via INTRAVENOUS
  Filled 2018-11-19: qty 1250

## 2018-11-19 MED ORDER — POTASSIUM CHLORIDE 2 MEQ/ML IV SOLN
80.0000 meq | INTRAVENOUS | Status: DC
  Filled 2018-11-19: qty 40

## 2018-11-19 MED ORDER — DEXMEDETOMIDINE HCL IN NACL 400 MCG/100ML IV SOLN
0.1000 ug/kg/h | INTRAVENOUS | Status: AC
  Administered 2018-11-21: 0.7 ug/kg/h via INTRAVENOUS
  Filled 2018-11-19: qty 100

## 2018-11-19 MED ORDER — MAGNESIUM SULFATE 50 % IJ SOLN
40.0000 meq | INTRAMUSCULAR | Status: DC
  Filled 2018-11-19: qty 9.85

## 2018-11-19 MED ORDER — TRANEXAMIC ACID 1000 MG/10ML IV SOLN
1.5000 mg/kg/h | INTRAVENOUS | Status: AC
  Administered 2018-11-21: 1.5 mg/kg/h via INTRAVENOUS
  Filled 2018-11-19: qty 25

## 2018-11-19 MED ORDER — SODIUM CHLORIDE 0.9 % IV SOLN
INTRAVENOUS | Status: DC
  Filled 2018-11-19: qty 30

## 2018-11-19 MED ORDER — PLASMA-LYTE 148 IV SOLN
INTRAVENOUS | Status: DC
  Filled 2018-11-19: qty 2.5

## 2018-11-19 MED ORDER — DOPAMINE-DEXTROSE 3.2-5 MG/ML-% IV SOLN
0.0000 ug/kg/min | INTRAVENOUS | Status: DC
  Filled 2018-11-19: qty 250

## 2018-11-19 MED ORDER — MILRINONE LACTATE IN DEXTROSE 20-5 MG/100ML-% IV SOLN
0.3000 ug/kg/min | INTRAVENOUS | Status: DC
  Filled 2018-11-19: qty 100

## 2018-11-19 MED ORDER — TRANEXAMIC ACID (OHS) BOLUS VIA INFUSION
15.0000 mg/kg | INTRAVENOUS | Status: AC
  Administered 2018-11-21: 1251 mg via INTRAVENOUS
  Filled 2018-11-19: qty 1251

## 2018-11-19 MED ORDER — TRANEXAMIC ACID (OHS) PUMP PRIME SOLUTION
2.0000 mg/kg | INTRAVENOUS | Status: DC
  Filled 2018-11-19: qty 1.67

## 2018-11-19 MED ORDER — LEVOFLOXACIN IN D5W 500 MG/100ML IV SOLN
500.0000 mg | INTRAVENOUS | Status: DC
  Filled 2018-11-19: qty 100

## 2018-11-19 MED ORDER — INSULIN REGULAR(HUMAN) IN NACL 100-0.9 UT/100ML-% IV SOLN
INTRAVENOUS | Status: AC
  Administered 2018-11-21: .8 [IU]/h via INTRAVENOUS
  Filled 2018-11-19: qty 100

## 2018-11-19 NOTE — Progress Notes (Signed)
ANTICOAGULATION CONSULT NOTE   Pharmacy Consult for Heparin  Indication:  ACS, chest pain  Allergies  Allergen Reactions  . Penicillins Other (See Comments)    UNSPECIFIED REACTION  Unknown reaction as a child.  Tolerated Zosyn 2017.  Marland Kitchen Morphine And Related Anxiety  . Pregabalin Anxiety    Patient Measurements: Height: 5\' 5"  (165.1 cm) Weight: 183 lb 13.8 oz (83.4 kg) IBW/kg (Calculated) : 57 Heparin Dosing Weight: 75 kg  Vital Signs: Temp: 98.8 F (37.1 C) (04/18 0606) Temp Source: Oral (04/18 0606) BP: 164/79 (04/18 0606) Pulse Rate: 72 (04/18 0606)  Labs: Recent Labs    11/17/18 0610 11/18/18 0342 11/18/18 2006 11/19/18 0439 11/19/18 1322  HGB 13.8 12.2 12.4 12.0  --   HCT 41.5 36.6 37.8 36.0  --   PLT 178 146* 153 142*  --   HEPARINUNFRC  --   --  <0.10* 0.90* 0.88*  CREATININE 0.68 0.55  --   --   --     Estimated Creatinine Clearance: 60.9 mL/min (by C-G formula based on SCr of 0.55 mg/dL).   Medical History: Past Medical History:  Diagnosis Date  . Anemia     mild as per PCP  . Arthritis    hip/ s/p recent shoulder fracture 8/12- RIGHT  . Asthma   . Coronary artery disease 05/30/2018   LAD PCI/DES  . GI bleeding   . Hiatal hernia   . Humerus fracture    with wrist on right side  . Hypertension    hypercholesterolemia/  EKG on chart with clearance and note 06/18/11  Mazzocchi  . Pneumonia     Assessment: 78 y.o female with h/o CAD s/p PCI, DES to proximal LAD 05/2018.  Now S/p cardiac cath on 11/17/18 >revealed severe 2vCAD, including LAD restenosis.  TCTS consulted plan for CABG 11/21/18.  Today patient is having off and on chest pain 4/17, thus cardiologist has order to start IV heparin.  She was not on anticoagulation PTA.   Heparin level 0.88 despite rate reduction earlier this morning.  No bleeding reported, CBC stable.  Goal of Therapy:  Heparin level 0.3-0.7 units/ml Monitor platelets by anticoagulation protocol: Yes   Plan:   Decrease heparin to 1000 units/hr Check heparin level 8 hours after rate change Daily heparin level and CBC. CABG on 4/20  Thank you for allowing pharmacy to be part of this patients care team.  Reece Leader, Colon Flattery, [facility] Clinical Pharmacist Phone [phone]  11/19/2018 2:39 PM

## 2018-11-19 NOTE — Progress Notes (Signed)
Progress Note  Patient Name: Monique [name] Date of Encounter: 11/19/2018  Primary Cardiologist: Nanetta BattyJonathan Berry, MD   Subjective   No angina on iv nitro and heparin   Inpatient Medications    Scheduled Meds: . aspirin  81 mg Oral Daily  . aspirin EC  81 mg Oral Daily  . atorvastatin  80 mg Oral q1800  . carvedilol  6.25 mg Oral BID WC  . ferrous sulfate  325 mg Oral BID WC  . ketotifen  1 drop Both Eyes BID  . lisinopril  40 mg Oral Daily  . mupirocin ointment  1 application Nasal BID  . pantoprazole  40 mg Oral BID AC  . polyethylene glycol  17 g Oral BID  . sodium chloride flush  3 mL Intravenous Q12H  . venlafaxine XR  225 mg Oral QHS   Continuous Infusions: . sodium chloride    . heparin 1,150 Units/hr (11/19/18 0554)  . nitroGLYCERIN 10 mcg/min (11/18/18 1747)   PRN Meds: sodium chloride, acetaminophen, albuterol, nitroGLYCERIN, ondansetron (ZOFRAN) IV, sodium chloride flush   Vital Signs    Vitals:   11/18/18 1640 11/18/18 1747 11/18/18 2100 11/19/18 0606  BP: (!) 158/72 (!) 137/57 (!) 153/64 (!) 164/79  Pulse: 84  76 72  Resp:   18 16  Temp:   97.8 F (36.6 C) 98.8 F (37.1 C)  TempSrc:   Oral Oral  SpO2:   99% 96%  Weight:    83.4 kg  Height:        Intake/Output Summary (Last 24 hours) at 11/19/2018 [id number] Last data filed at 11/18/2018 1804 Gross per 24 hour  Intake 727 ml  Output -  Net 727 ml   Last 3 Weights 11/19/2018 11/18/2018 11/17/2018  Weight (lbs) 183 lb 13.8 oz 184 lb 183 lb 4.8 oz  Weight (kg) 83.4 kg 83.462 kg 83.144 kg      Telemetry    SR - Personally Reviewed  ECG    No new tracing- Personally Reviewed  Physical Exam   GEN: No acute distress.   Neck: No JVD Cardiac: RRR, no murmurs, rubs, or gallops.  Respiratory: Clear to auscultation bilaterally. GI: Soft, nontender, non-distended  MS: No edema; No deformity. Neuro:  Nonfocal  Psych: Normal affect   Labs    Chemistry Recent Labs  Lab 11/17/18 0610  11/18/18 0342  NA 141 142  K 4.0 3.9  CL 104 107  CO2 26 30  GLUCOSE 114* 147*  BUN 27* 16  CREATININE 0.68 0.55  CALCIUM 9.6 9.2  GFRNONAA >60 >60  GFRAA >60 >60  ANIONGAP 11 5     Hematology Recent Labs  Lab 11/18/18 0342 11/18/18 2006 11/19/18 0439  WBC 5.9 6.2 6.3  RBC 3.94 4.11 3.88  HGB 12.2 12.4 12.0  HCT 36.6 37.8 36.0  MCV 92.9 92.0 92.8  MCH 31.0 30.2 30.9  MCHC 33.3 32.8 33.3  RDW 13.8 13.9 13.9  PLT 146* 153 142*    Cardiac EnzymesNo results for input(s): TROPONINI in the last 168 hours. No results for input(s): TROPIPOC in the last 168 hours.   BNPNo results for input(s): BNP, PROBNP in the last 168 hours.   DDimer No results for input(s): DDIMER in the last 168 hours.   Radiology    Pre CABG dopplers no significant ICA stenosis ABI;s ok   Cardiac Studies   Cardiac catheterization 11/17/2018   Ost LAD to Prox LAD lesion is 95% stenosed.  Lat Ramus lesion is  90% stenosed.  Ramus lesion is 90% stenosed.   Echocardiogram 11/17/2018 1. The left ventricle has hyperdynamic systolic function, with an ejection fraction of >65%. The cavity size was normal. There is mild concentric left ventricular hypertrophy. Left ventricular diastolic Doppler parameters are consistent with impaired  relaxation. No evidence of left ventricular regional wall motion abnormalities.  2. The right ventricle has normal systolic function. The cavity was normal. There is no increase in right ventricular wall thickness.  3. The aortic valve is tricuspid. Mild sclerosis of the aortic valve. Aortic valve regurgitation was not assessed by color flow Doppler.  4. The interatrial septum appears to be lipomatous.  Patient Profile     78 y.o. female   Assessment & Plan    1.  CAD -status post cath with surgical disease in LAD/D1/OM EF preserved Post cath angina on iv nitro and heparin For CABG with Dr Laneta Simmers Monday All questions answered coreg dose increased yesterday   2.   Hyperlipidemia -Continue high-dose statin   For questions or updates, please contact [facility] Please consult [email or web address] for contact info under        Signed, Charlton Haws, MD  11/19/2018, 8:22 AM

## 2018-11-19 NOTE — Progress Notes (Signed)
ANTICOAGULATION CONSULT NOTE   Pharmacy Consult for Heparin  Indication:  ACS, chest pain  Allergies  Allergen Reactions  . Penicillins Other (See Comments)    UNSPECIFIED REACTION  Unknown reaction as a child.  Tolerated Zosyn 2017.  Marland Kitchen Morphine And Related Anxiety  . Pregabalin Anxiety    Patient Measurements: Height: 5\' 5"  (165.1 cm) Weight: 183 lb 13.8 oz (83.4 kg) IBW/kg (Calculated) : 57 Heparin Dosing Weight: 75 kg  Vital Signs: Temp: 98.1 F (36.7 C) (04/18 2206) Temp Source: Oral (04/18 2206) BP: 176/67 (04/18 2206) Pulse Rate: 71 (04/18 2206)  Labs: Recent Labs    11/17/18 0610 11/18/18 0342  11/18/18 2006 11/19/18 0439 11/19/18 1322 11/19/18 2244  HGB 13.8 12.2  --  12.4 12.0  --   --   HCT 41.5 36.6  --  37.8 36.0  --   --   PLT 178 146*  --  153 142*  --   --   HEPARINUNFRC  --   --    < > <0.10* 0.90* 0.88* 0.60  CREATININE 0.68 0.55  --   --   --   --   --    < > = values in this interval not displayed.    Estimated Creatinine Clearance: 60.9 mL/min (by C-G formula based on SCr of 0.55 mg/dL).   Medical History: Past Medical History:  Diagnosis Date  . Anemia     mild as per PCP  . Arthritis    hip/ s/p recent shoulder fracture 8/12- RIGHT  . Asthma   . Coronary artery disease 05/30/2018   LAD PCI/DES  . GI bleeding   . Hiatal hernia   . Humerus fracture    with wrist on right side  . Hypertension    hypercholesterolemia/  EKG on chart with clearance and note 06/18/11  Mazzocchi  . Pneumonia     Assessment: 78 y.o female with h/o CAD s/p PCI, DES to proximal LAD 05/2018.  Now S/p cardiac cath on 11/17/18 >revealed severe 2vCAD, including LAD restenosis.  TCTS consulted plan for CABG 11/21/18.  Today patient is having off and on chest pain 4/17, thus cardiologist has order to start IV heparin.  She was not on anticoagulation PTA.   Heparin level now 0.6 units/ml  Goal of Therapy:  Heparin level 0.3-0.7 units/ml Monitor platelets by  anticoagulation protocol: Yes   Plan:  Continue heparin at 1000 units/hr Daily heparin level and CBC. CABG on 4/20  Thank you for allowing pharmacy to be part of this patients care team.  Talbert Cage, PharmD

## 2018-11-19 NOTE — Progress Notes (Signed)
ANTICOAGULATION CONSULT NOTE   Pharmacy Consult for Heparin  Indication:  ACS, chest pain  Allergies  Allergen Reactions  . Penicillins Other (See Comments)    UNSPECIFIED REACTION  Unknown reaction as a child.  Tolerated Zosyn 2017.  Marland Kitchen Morphine And Related Anxiety  . Pregabalin Anxiety    Patient Measurements: Height: 5\' 5"  (165.1 cm) Weight: 184 lb (83.5 kg) IBW/kg (Calculated) : 57 Heparin Dosing Weight: 75 kg  Vital Signs: Temp: 97.8 F (36.6 C) (04/17 2100) Temp Source: Oral (04/17 2100) BP: 153/64 (04/17 2100) Pulse Rate: 76 (04/17 2100)  Labs: Recent Labs    11/17/18 0610 11/18/18 0342 11/18/18 2006 11/19/18 0439  HGB 13.8 12.2 12.4 12.0  HCT 41.5 36.6 37.8 36.0  PLT 178 146* 153 142*  HEPARINUNFRC  --   --  <0.10* 0.90*  CREATININE 0.68 0.55  --   --     Estimated Creatinine Clearance: 60.9 mL/min (by C-G formula based on SCr of 0.55 mg/dL).   Medical History: Past Medical History:  Diagnosis Date  . Anemia     mild as per PCP  . Arthritis    hip/ s/p recent shoulder fracture 8/12- RIGHT  . Asthma   . Coronary artery disease 05/30/2018   LAD PCI/DES  . GI bleeding   . Hiatal hernia   . Humerus fracture    with wrist on right side  . Hypertension    hypercholesterolemia/  EKG on chart with clearance and note 06/18/11  Mazzocchi  . Pneumonia     Assessment: 78 y.o female with h/o CAD s/p PCI, DES to proximal LAD 05/2018.  Now S/p cardiac cath on 11/17/18 >revealed severe 2vCAD, including LAD restenosis.  TCTS consulted plan for CABG 11/21/18.  Today patient is having off and on chest pain 4/17, thus cardiologist has order to start IV heparin.  She was not on anticoagulation PTA.   Heparin level this am 0.9 units/ml.  No bleeding reported  Goal of Therapy:  Heparin level 0.3-0.7 units/ml Monitor platelets by anticoagulation protocol: Yes   Plan:  Decrease heparin to 1150 units/hr Check heparin level 6-8 hours after rate change CABG on  4/20  Thank you for allowing pharmacy to be part of this patients care team.  Talbert Cage, PharmD Clinical Pharmacist 11/19/2018 5:54 AM

## 2018-11-20 ENCOUNTER — Inpatient Hospital Stay (HOSPITAL_COMMUNITY): Payer: Medicare Other

## 2018-11-20 LAB — URINALYSIS, ROUTINE W REFLEX MICROSCOPIC
Bilirubin Urine: NEGATIVE
Glucose, UA: NEGATIVE mg/dL
Hgb urine dipstick: NEGATIVE
Ketones, ur: NEGATIVE mg/dL
Leukocytes,Ua: NEGATIVE
Nitrite: NEGATIVE
Protein, ur: NEGATIVE mg/dL
Specific Gravity, Urine: 1.013 (ref 1.005–1.030)
pH: 5 (ref 5.0–8.0)

## 2018-11-20 LAB — CBC
HCT: 38.5 % (ref 36.0–46.0)
HCT: 36.8 % (ref 36.0–46.0)
Hemoglobin: 12.6 g/dL (ref 12.0–15.0)
Hemoglobin: 11.7 g/dL — ABNORMAL LOW (ref 12.0–15.0)
MCH: 30.9 pg (ref 26.0–34.0)
MCH: 29.7 pg (ref 26.0–34.0)
MCHC: 32.7 g/dL (ref 30.0–36.0)
MCHC: 31.8 g/dL (ref 30.0–36.0)
MCV: 94.4 fL (ref 80.0–100.0)
MCV: 93.4 fL (ref 80.0–100.0)
Platelets: 165 10*3/uL (ref 150–400)
Platelets: 159 10*3/uL (ref 150–400)
RBC: 4.08 MIL/uL (ref 3.87–5.11)
RBC: 3.94 MIL/uL (ref 3.87–5.11)
RDW: 14 % (ref 11.5–15.5)
RDW: 13.9 % (ref 11.5–15.5)
WBC: 6.8 10*3/uL (ref 4.0–10.5)
WBC: 6.9 10*3/uL (ref 4.0–10.5)
nRBC: 0 % (ref 0.0–0.2)
nRBC: 0 % (ref 0.0–0.2)

## 2018-11-20 LAB — BLOOD GAS, ARTERIAL
Acid-Base Excess: 1.8 mmol/L (ref 0.0–2.0)
Bicarbonate: 26.2 mmol/L (ref 20.0–28.0)
Drawn by: 511911
O2 Content: 2 L/min
O2 Saturation: 97.1 %
Patient temperature: 98.6
pCO2 arterial: 43.9 mmHg (ref 32.0–48.0)
pH, Arterial: 7.394 (ref 7.350–7.450)
pO2, Arterial: 99.6 mmHg (ref 83.0–108.0)

## 2018-11-20 LAB — PLATELET INHIBITION P2Y12: Platelet Function  P2Y12: 271 [PRU] (ref 182–335)

## 2018-11-20 LAB — COMPREHENSIVE METABOLIC PANEL
ALT: 27 U/L (ref 0–44)
AST: 21 U/L (ref 15–41)
Albumin: 3.5 g/dL (ref 3.5–5.0)
Alkaline Phosphatase: 67 U/L (ref 38–126)
Anion gap: 6 (ref 5–15)
BUN: 18 mg/dL (ref 8–23)
CO2: 29 mmol/L (ref 22–32)
Calcium: 8.8 mg/dL — ABNORMAL LOW (ref 8.9–10.3)
Chloride: 102 mmol/L (ref 98–111)
Creatinine, Ser: 0.65 mg/dL (ref 0.44–1.00)
GFR calc Af Amer: >60 mL/min (ref >60)
GFR calc non Af Amer: >60 mL/min (ref >60)
Glucose, Bld: 135 mg/dL — ABNORMAL HIGH (ref 70–99)
Potassium: 3.8 mmol/L (ref 3.5–5.1)
Sodium: 137 mmol/L (ref 135–145)
Total Bilirubin: 1.1 mg/dL (ref 0.3–1.2)
Total Protein: 5.6 g/dL — ABNORMAL LOW (ref 6.5–8.1)

## 2018-11-20 LAB — PROTIME-INR
INR: 1.1 (ref 0.8–1.2)
Prothrombin Time: 13.7 seconds (ref 11.4–15.2)

## 2018-11-20 LAB — APTT: aPTT: 126 seconds — ABNORMAL HIGH (ref 24–36)

## 2018-11-20 LAB — HEPARIN LEVEL (UNFRACTIONATED): Heparin Unfractionated: 0.63 IU/mL (ref 0.30–0.70)

## 2018-11-20 MED ORDER — SODIUM CHLORIDE 0.9 % IV SOLN
1.5000 g | INTRAVENOUS | Status: AC
  Administered 2018-11-21: 1.5 g via INTRAVENOUS
  Filled 2018-11-20: qty 1.5

## 2018-11-20 MED ORDER — CHLORHEXIDINE GLUCONATE CLOTH 2 % EX PADS
6.0000 | MEDICATED_PAD | Freq: Once | CUTANEOUS | Status: AC
  Administered 2018-11-21: 6 via TOPICAL

## 2018-11-20 MED ORDER — DIAZEPAM 2 MG PO TABS
2.0000 mg | ORAL_TABLET | Freq: Once | ORAL | Status: AC
  Administered 2018-11-21: 2 mg via ORAL
  Filled 2018-11-20: qty 1

## 2018-11-20 MED ORDER — TEMAZEPAM 7.5 MG PO CAPS: 15.0000 mg | ORAL_CAPSULE | Freq: Once | ORAL | Status: DC | PRN

## 2018-11-20 MED ORDER — CHLORHEXIDINE GLUCONATE CLOTH 2 % EX PADS
6.0000 | MEDICATED_PAD | Freq: Once | CUTANEOUS | Status: AC
  Administered 2018-11-20: 21:00:00 6 via TOPICAL

## 2018-11-20 MED ORDER — METOPROLOL TARTRATE 12.5 MG HALF TABLET
12.5000 mg | ORAL_TABLET | Freq: Once | ORAL | Status: AC
  Administered 2018-11-20: 12.5 mg via ORAL
  Filled 2018-11-20: qty 1

## 2018-11-20 MED ORDER — CHLORHEXIDINE GLUCONATE 0.12 % MT SOLN
15.0000 mL | Freq: Once | OROMUCOSAL | Status: AC
  Administered 2018-11-21: 15 mL via OROMUCOSAL
  Filled 2018-11-20: qty 15

## 2018-11-20 MED ORDER — SODIUM CHLORIDE 0.9 % IV SOLN
750.0000 mg | INTRAVENOUS | Status: DC
  Filled 2018-11-20: qty 750

## 2018-11-20 MED ORDER — BISACODYL 5 MG PO TBEC
5.0000 mg | DELAYED_RELEASE_TABLET | Freq: Once | ORAL | Status: AC
  Administered 2018-11-20: 5 mg via ORAL
  Filled 2018-11-20: qty 1

## 2018-11-20 NOTE — Progress Notes (Signed)
Called to see secondary to chest pain.  Pt says she was just sitting on the side of the bed and developed severe chest "pressure" no associated diaphoresis.  Symptoms lasted about 10 minutes and responded to increased IV NTG.  She is currently pain free.  B/P 128/74- HR 85.  I will give her one dose of metoprolol 12.5 now x 1.   Corine Shelter PA-C 11/20/2018 2:57 PM

## 2018-11-20 NOTE — Progress Notes (Signed)
Pt states 0/10 pain. BP 127/96, HR 89. 2L O2 remains on patient. Pt resting in bed with no signs of distress at this time.

## 2018-11-20 NOTE — Progress Notes (Signed)
ANTICOAGULATION CONSULT NOTE   Pharmacy Consult for Heparin  Indication:  ACS, chest pain  Allergies  Allergen Reactions  . Penicillins Other (See Comments)    UNSPECIFIED REACTION  Unknown reaction as a child.  Tolerated Zosyn 2017.  Marland Kitchen Morphine And Related Anxiety  . Pregabalin Anxiety    Patient Measurements: Height: 5\' 5"  (165.1 cm) Weight: 183 lb 3.2 oz (83.1 kg) IBW/kg (Calculated) : 57 Heparin Dosing Weight: 75 kg  Vital Signs: Temp: 97.8 F (36.6 C) (04/19 0640) Temp Source: Oral (04/19 0640) BP: 117/62 (04/19 0640) Pulse Rate: 79 (04/19 0640)  Labs: Recent Labs    11/18/18 0342  11/18/18 2006 11/19/18 0439 11/19/18 1322 11/19/18 2244 11/20/18 0419  HGB 12.2  --  12.4 12.0  --   --  12.6  HCT 36.6  --  37.8 36.0  --   --  38.5  PLT 146*  --  153 142*  --   --  165  HEPARINUNFRC  --    < > <0.10* 0.90* 0.88* 0.60 0.63  CREATININE 0.55  --   --   --   --   --   --    < > = values in this interval not displayed.    Estimated Creatinine Clearance: 60.7 mL/min (by C-G formula based on SCr of 0.55 mg/dL).   Medical History: Past Medical History:  Diagnosis Date  . Anemia     mild as per PCP  . Arthritis    hip/ s/p recent shoulder fracture 8/12- RIGHT  . Asthma   . Coronary artery disease 05/30/2018   LAD PCI/DES  . GI bleeding   . Hiatal hernia   . Humerus fracture    with wrist on right side  . Hypertension    hypercholesterolemia/  EKG on chart with clearance and note 06/18/11  Mazzocchi  . Pneumonia     Assessment: 78 y.o female with h/o CAD s/p PCI, DES to proximal LAD 05/2018.  Now S/p cardiac cath on 11/17/18 >revealed severe 2vCAD, including LAD restenosis.  TCTS consulted plan for CABG 11/21/18.  Today patient is having off and on chest pain 4/17, thus cardiologist has order to start IV heparin.  She was not on anticoagulation PTA.   Heparin level now 0.63 units/ml - within goal range, but at upper end.  Goal of Therapy:  Heparin level  0.3-0.7 units/ml Monitor platelets by anticoagulation protocol: Yes   Plan:  Decrease heparin gtt slightly to 950 units/hr. Daily heparin level and CBC. CABG on 4/20  Thank you for allowing pharmacy to be part of this patients care team.  Reece Leader, Colon Flattery, [facility] Clinical Pharmacist Phone [phone]  11/20/2018 9:05 AM

## 2018-11-20 NOTE — Progress Notes (Addendum)
Pt complain of chest pain 9/10. BP 166/89, HR 98. EKG performed, 2L O2 applied, increased nitro gtt. Notified cardiology Mayford Knife, MD). Will continue to monitor.

## 2018-11-20 NOTE — Progress Notes (Signed)
Progress Note  Patient Name: Monique [name] Date of Encounter: 11/20/2018  Primary Cardiologist: Nanetta BattyJonathan Berry, MD   Subjective   No angina on iv nitro and heparin   Inpatient Medications    Scheduled Meds: . aspirin  81 mg Oral Daily  . aspirin EC  81 mg Oral Daily  . atorvastatin  80 mg Oral q1800  . carvedilol  6.25 mg Oral BID WC  . ferrous sulfate  325 mg Oral BID WC  . [START ON 11/21/2018] heparin-papaverine-plasmalyte irrigation   Irrigation To OR  . [START ON 11/21/2018] insulin   Intravenous To OR  . ketotifen  1 drop Both Eyes BID  . lisinopril  40 mg Oral Daily  . [START ON 11/21/2018] magnesium sulfate  40 mEq Other To OR  . mupirocin ointment  1 application Nasal BID  . pantoprazole  40 mg Oral BID AC  . [START ON 11/21/2018] phenylephrine  30-200 mcg/min Intravenous To OR  . polyethylene glycol  17 g Oral BID  . [START ON 11/21/2018] potassium chloride  80 mEq Other To OR  . sodium chloride flush  3 mL Intravenous Q12H  . [START ON 11/21/2018] tranexamic acid  15 mg/kg Intravenous To OR  . [START ON 11/21/2018] tranexamic acid  2 mg/kg Intracatheter To OR  . venlafaxine XR  225 mg Oral QHS   Continuous Infusions: . sodium chloride    . [START ON 11/21/2018] dexmedetomidine    . [START ON 11/21/2018] DOPamine    . [START ON 11/21/2018] epinephrine    . [START ON 11/21/2018] heparin 30,000 units/NS 1000 mL solution for CELLSAVER    . heparin 1,000 Units/hr (11/20/18 0400)  . [START ON 11/21/2018] levofloxacin (LEVAQUIN) IV    . [START ON 11/21/2018] milrinone    . nitroGLYCERIN 15 mcg/min (11/20/18 0400)  . [START ON 11/21/2018] nitroGLYCERIN    . [START ON 11/21/2018] tranexamic acid (CYKLOKAPRON) infusion ([facility])    . [START ON 11/21/2018] vancomycin     PRN Meds: sodium chloride, acetaminophen, albuterol, nitroGLYCERIN, ondansetron (ZOFRAN) IV, sodium chloride flush   Vital Signs    Vitals:   11/19/18 0606 11/19/18 1459 11/19/18 2206 11/20/18 0640  BP: (!)  164/79 (!) 152/69 (!) 176/67 117/62  Pulse: 72 79 71 79  Resp: 16 18 18 18   Temp: 98.8 F (37.1 C) 98.3 F (36.8 C) 98.1 F (36.7 C) 97.8 F (36.6 C)  TempSrc: Oral Oral Oral Oral  SpO2: 96% 97% 97% 95%  Weight: 83.4 kg   83.1 kg  Height:        Intake/Output Summary (Last 24 hours) at 11/20/2018 0804 Last data filed at 11/20/2018 0400 Gross per 24 hour  Intake 754.76 ml  Output -  Net 754.76 ml   Last 3 Weights 11/20/2018 11/19/2018 11/18/2018  Weight (lbs) 183 lb 3.2 oz 183 lb 13.8 oz 184 lb  Weight (kg) 83.099 kg 83.4 kg 83.462 kg      Telemetry    SR - Personally Reviewed  ECG    No new tracing- Personally Reviewed  Physical Exam   GEN: No acute distress.   Neck: No JVD Cardiac: RRR, no murmurs, rubs, or gallops.  Respiratory: Clear to auscultation bilaterally. GI: Soft, nontender, non-distended  MS: No edema; No deformity. Neuro:  Nonfocal  Psych: Normal affect   Labs    Chemistry Recent Labs  Lab 11/17/18 0610 11/18/18 0342  NA 141 142  K 4.0 3.9  CL 104 107  CO2 26 30  GLUCOSE 114* 147*  BUN 27* 16  CREATININE 0.68 0.55  CALCIUM 9.6 9.2  GFRNONAA >60 >60  GFRAA >60 >60  ANIONGAP 11 5     Hematology Recent Labs  Lab 11/18/18 2006 11/19/18 0439 11/20/18 0419  WBC 6.2 6.3 6.8  RBC 4.11 3.88 4.08  HGB 12.4 12.0 12.6  HCT 37.8 36.0 38.5  MCV 92.0 92.8 94.4  MCH 30.2 30.9 30.9  MCHC 32.8 33.3 32.7  RDW 13.9 13.9 14.0  PLT 153 142* 165    Cardiac EnzymesNo results for input(s): TROPONINI in the last 168 hours. No results for input(s): TROPIPOC in the last 168 hours.   BNPNo results for input(s): BNP, PROBNP in the last 168 hours.   DDimer No results for input(s): DDIMER in the last 168 hours.   Radiology    Pre CABG dopplers no significant ICA stenosis ABI;s ok   Cardiac Studies   Cardiac catheterization 11/17/2018   Ost LAD to Prox LAD lesion is 95% stenosed.  Lat Ramus lesion is 90% stenosed.  Ramus lesion is 90%  stenosed.   Echocardiogram 11/17/2018 1. The left ventricle has hyperdynamic systolic function, with an ejection fraction of >65%. The cavity size was normal. There is mild concentric left ventricular hypertrophy. Left ventricular diastolic Doppler parameters are consistent with impaired  relaxation. No evidence of left ventricular regional wall motion abnormalities.  2. The right ventricle has normal systolic function. The cavity was normal. There is no increase in right ventricular wall thickness.  3. The aortic valve is tricuspid. Mild sclerosis of the aortic valve. Aortic valve regurgitation was not assessed by color flow Doppler.  4. The interatrial septum appears to be lipomatous.  Patient Profile     78 y.o. female CAD previous stent to LAD 05/22/18 with incomplete expansion and recurrent angina for CABG Monday   Assessment & Plan    1.  CAD -status post cath with surgical disease in LAD/D1/OM EF preserved Post cath angina on iv nitro and heparin For CABG with Dr Laneta Simmers Monday All questions answered coreg dose increased yesterday Pre CABG dopplers normal   2.  Hyperlipidemia -Continue high-dose statin   For questions or updates, please contact [facility] Please consult [email or web address] for contact info under        Signed, Charlton Haws, MD  11/20/2018, 8:04 AM

## 2018-11-21 ENCOUNTER — Inpatient Hospital Stay (HOSPITAL_COMMUNITY): Payer: Medicare Other

## 2018-11-21 ENCOUNTER — Inpatient Hospital Stay (HOSPITAL_COMMUNITY): Payer: Medicare Other | Admitting: Certified Registered Nurse Anesthetist

## 2018-11-21 ENCOUNTER — Inpatient Hospital Stay (HOSPITAL_COMMUNITY)
Admission: RE | Disposition: A | Discharge status: Home Health | Payer: Self-pay | Source: Home / Self Care | Attending: Surgery

## 2018-11-21 DIAGNOSIS — I25110 Atherosclerotic heart disease of native coronary artery with unstable angina pectoris: Secondary | ICD-10-CM

## 2018-11-21 DIAGNOSIS — Z951 Presence of aortocoronary bypass graft: Secondary | ICD-10-CM

## 2018-11-21 HISTORY — PX: CORONARY ARTERY BYPASS GRAFT: SHX141

## 2018-11-21 HISTORY — PX: TEE WITHOUT CARDIOVERSION: SHX5443

## 2018-11-21 LAB — CBC
HCT: 29 % — ABNORMAL LOW (ref 36.0–46.0)
HCT: 28.9 % — ABNORMAL LOW (ref 36.0–46.0)
HCT: 29.2 % — ABNORMAL LOW (ref 36.0–46.0)
Hemoglobin: 9.7 g/dL — ABNORMAL LOW (ref 12.0–15.0)
Hemoglobin: 9.1 g/dL — ABNORMAL LOW (ref 12.0–15.0)
Hemoglobin: 9.4 g/dL — ABNORMAL LOW (ref 12.0–15.0)
MCH: 31.4 pg (ref 26.0–34.0)
MCH: 30.1 pg (ref 26.0–34.0)
MCH: 30.2 pg (ref 26.0–34.0)
MCHC: 33.4 g/dL (ref 30.0–36.0)
MCHC: 31.5 g/dL (ref 30.0–36.0)
MCHC: 32.2 g/dL (ref 30.0–36.0)
MCV: 93.9 fL (ref 80.0–100.0)
MCV: 95.7 fL (ref 80.0–100.0)
MCV: 93.9 fL (ref 80.0–100.0)
Platelets: 73 10*3/uL — ABNORMAL LOW (ref 150–400)
Platelets: 72 10*3/uL — ABNORMAL LOW (ref 150–400)
Platelets: 90 10*3/uL — ABNORMAL LOW (ref 150–400)
RBC: 3.09 MIL/uL — ABNORMAL LOW (ref 3.87–5.11)
RBC: 3.02 MIL/uL — ABNORMAL LOW (ref 3.87–5.11)
RBC: 3.11 MIL/uL — ABNORMAL LOW (ref 3.87–5.11)
RDW: 14.2 % (ref 11.5–15.5)
RDW: 14.6 % (ref 11.5–15.5)
RDW: 14.8 % (ref 11.5–15.5)
WBC: 6.6 10*3/uL (ref 4.0–10.5)
WBC: 9.9 10*3/uL (ref 4.0–10.5)
WBC: 8.1 10*3/uL (ref 4.0–10.5)
nRBC: 0 % (ref 0.0–0.2)
nRBC: 0 % (ref 0.0–0.2)
nRBC: 0 % (ref 0.0–0.2)

## 2018-11-21 LAB — POCT I-STAT 7, (LYTES, BLD GAS, ICA,H+H)
Acid-Base Excess: 3 mmol/L — ABNORMAL HIGH (ref 0.0–2.0)
Acid-Base Excess: 3 mmol/L — ABNORMAL HIGH (ref 0.0–2.0)
Acid-Base Excess: 1 mmol/L (ref 0.0–2.0)
Acid-base deficit: 1 mmol/L (ref 0.0–2.0)
Acid-base deficit: 1 mmol/L (ref 0.0–2.0)
Acid-base deficit: 1 mmol/L (ref 0.0–2.0)
Acid-base deficit: 2 mmol/L (ref 0.0–2.0)
Acid-base deficit: 2 mmol/L (ref 0.0–2.0)
Bicarbonate: 24.3 mmol/L (ref 20.0–28.0)
Bicarbonate: 24.8 mmol/L (ref 20.0–28.0)
Bicarbonate: 24.9 mmol/L (ref 20.0–28.0)
Bicarbonate: 25.1 mmol/L (ref 20.0–28.0)
Bicarbonate: 25.1 mmol/L (ref 20.0–28.0)
Bicarbonate: 26.4 mmol/L (ref 20.0–28.0)
Bicarbonate: 27.7 mmol/L (ref 20.0–28.0)
Bicarbonate: 30.5 mmol/L — ABNORMAL HIGH (ref 20.0–28.0)
Calcium, Ion: 0.84 mmol/L — CL (ref 1.15–1.40)
Calcium, Ion: 1.08 mmol/L — ABNORMAL LOW (ref 1.15–1.40)
Calcium, Ion: 1.09 mmol/L — ABNORMAL LOW (ref 1.15–1.40)
Calcium, Ion: 1.14 mmol/L — ABNORMAL LOW (ref 1.15–1.40)
Calcium, Ion: 1.15 mmol/L (ref 1.15–1.40)
Calcium, Ion: 1.17 mmol/L (ref 1.15–1.40)
Calcium, Ion: 1.17 mmol/L (ref 1.15–1.40)
Calcium, Ion: 1.28 mmol/L (ref 1.15–1.40)
HCT: 21 % — ABNORMAL LOW (ref 36.0–46.0)
HCT: 25 % — ABNORMAL LOW (ref 36.0–46.0)
HCT: 26 % — ABNORMAL LOW (ref 36.0–46.0)
HCT: 26 % — ABNORMAL LOW (ref 36.0–46.0)
HCT: 26 % — ABNORMAL LOW (ref 36.0–46.0)
HCT: 28 % — ABNORMAL LOW (ref 36.0–46.0)
HCT: 29 % — ABNORMAL LOW (ref 36.0–46.0)
HCT: 35 % — ABNORMAL LOW (ref 36.0–46.0)
Hemoglobin: 11.9 g/dL — ABNORMAL LOW (ref 12.0–15.0)
Hemoglobin: 7.1 g/dL — ABNORMAL LOW (ref 12.0–15.0)
Hemoglobin: 8.5 g/dL — ABNORMAL LOW (ref 12.0–15.0)
Hemoglobin: 8.8 g/dL — ABNORMAL LOW (ref 12.0–15.0)
Hemoglobin: 8.8 g/dL — ABNORMAL LOW (ref 12.0–15.0)
Hemoglobin: 8.8 g/dL — ABNORMAL LOW (ref 12.0–15.0)
Hemoglobin: 9.5 g/dL — ABNORMAL LOW (ref 12.0–15.0)
Hemoglobin: 9.9 g/dL — ABNORMAL LOW (ref 12.0–15.0)
O2 Saturation: 100 %
O2 Saturation: 100 %
O2 Saturation: 100 %
O2 Saturation: 100 %
O2 Saturation: 97 %
O2 Saturation: 98 %
O2 Saturation: 99 %
O2 Saturation: 99 %
Patient temperature: 35.7
Patient temperature: 35.9
Patient temperature: 37.3
Patient temperature: 37.3
Patient temperature: 37.3
Potassium: 4 mmol/L (ref 3.5–5.1)
Potassium: 4 mmol/L (ref 3.5–5.1)
Potassium: 4.1 mmol/L (ref 3.5–5.1)
Potassium: 4.1 mmol/L (ref 3.5–5.1)
Potassium: 4.3 mmol/L (ref 3.5–5.1)
Potassium: 4.4 mmol/L (ref 3.5–5.1)
Potassium: 4.5 mmol/L (ref 3.5–5.1)
Potassium: 6.1 mmol/L — ABNORMAL HIGH (ref 3.5–5.1)
Sodium: 140 mmol/L (ref 135–145)
Sodium: 144 mmol/L (ref 135–145)
Sodium: 144 mmol/L (ref 135–145)
Sodium: 144 mmol/L (ref 135–145)
Sodium: 146 mmol/L — ABNORMAL HIGH (ref 135–145)
Sodium: 148 mmol/L — ABNORMAL HIGH (ref 135–145)
Sodium: 148 mmol/L — ABNORMAL HIGH (ref 135–145)
Sodium: 148 mmol/L — ABNORMAL HIGH (ref 135–145)
TCO2: 26 mmol/L (ref 22–32)
TCO2: 26 mmol/L (ref 22–32)
TCO2: 26 mmol/L (ref 22–32)
TCO2: 26 mmol/L (ref 22–32)
TCO2: 27 mmol/L (ref 22–32)
TCO2: 28 mmol/L (ref 22–32)
TCO2: 29 mmol/L (ref 22–32)
TCO2: 32 mmol/L (ref 22–32)
pCO2 arterial: 39.5 mmHg (ref 32.0–48.0)
pCO2 arterial: 44.7 mmHg (ref 32.0–48.0)
pCO2 arterial: 45.4 mmHg (ref 32.0–48.0)
pCO2 arterial: 45.8 mmHg (ref 32.0–48.0)
pCO2 arterial: 47.6 mmHg (ref 32.0–48.0)
pCO2 arterial: 48.9 mmHg — ABNORMAL HIGH (ref 32.0–48.0)
pCO2 arterial: 50 mmHg — ABNORMAL HIGH (ref 32.0–48.0)
pCO2 arterial: 56.7 mmHg — ABNORMAL HIGH (ref 32.0–48.0)
pH, Arterial: 7.302 — ABNORMAL LOW (ref 7.350–7.450)
pH, Arterial: 7.314 — ABNORMAL LOW (ref 7.350–7.450)
pH, Arterial: 7.328 — ABNORMAL LOW (ref 7.350–7.450)
pH, Arterial: 7.335 — ABNORMAL LOW (ref 7.350–7.450)
pH, Arterial: 7.338 — ABNORMAL LOW (ref 7.350–7.450)
pH, Arterial: 7.345 — ABNORMAL LOW (ref 7.350–7.450)
pH, Arterial: 7.379 (ref 7.350–7.450)
pH, Arterial: 7.454 — ABNORMAL HIGH (ref 7.350–7.450)
pO2, Arterial: 104 mmHg (ref 83.0–108.0)
pO2, Arterial: 114 mmHg — ABNORMAL HIGH (ref 83.0–108.0)
pO2, Arterial: 157 mmHg — ABNORMAL HIGH (ref 83.0–108.0)
pO2, Arterial: 158 mmHg — ABNORMAL HIGH (ref 83.0–108.0)
pO2, Arterial: 181 mmHg — ABNORMAL HIGH (ref 83.0–108.0)
pO2, Arterial: 185 mmHg — ABNORMAL HIGH (ref 83.0–108.0)
pO2, Arterial: 389 mmHg — ABNORMAL HIGH (ref 83.0–108.0)
pO2, Arterial: 401 mmHg — ABNORMAL HIGH (ref 83.0–108.0)

## 2018-11-21 LAB — BASIC METABOLIC PANEL
Anion gap: 5 (ref 5–15)
BUN: 13 mg/dL (ref 8–23)
CO2: 21 mmol/L — ABNORMAL LOW (ref 22–32)
Calcium: 7.3 mg/dL — ABNORMAL LOW (ref 8.9–10.3)
Chloride: 113 mmol/L — ABNORMAL HIGH (ref 98–111)
Creatinine, Ser: 0.6 mg/dL (ref 0.44–1.00)
GFR calc Af Amer: >60 mL/min (ref >60)
GFR calc non Af Amer: >60 mL/min (ref >60)
Glucose, Bld: 182 mg/dL — ABNORMAL HIGH (ref 70–99)
Potassium: 5.5 mmol/L — ABNORMAL HIGH (ref 3.5–5.1)
Sodium: 139 mmol/L (ref 135–145)

## 2018-11-21 LAB — ECHO INTRAOPERATIVE TEE
Height: 65 in
Weight: 2923.2 oz

## 2018-11-21 LAB — POCT I-STAT 4, (NA,K, GLUC, HGB,HCT)
Glucose, Bld: 100 mg/dL — ABNORMAL HIGH (ref 70–99)
Glucose, Bld: 105 mg/dL — ABNORMAL HIGH (ref 70–99)
Glucose, Bld: 132 mg/dL — ABNORMAL HIGH (ref 70–99)
Glucose, Bld: 84 mg/dL (ref 70–99)
Glucose, Bld: 134 mg/dL — ABNORMAL HIGH (ref 70–99)
Glucose, Bld: 89 mg/dL (ref 70–99)
HCT: 35 % — ABNORMAL LOW (ref 36.0–46.0)
HCT: 30 % — ABNORMAL LOW (ref 36.0–46.0)
HCT: 21 % — ABNORMAL LOW (ref 36.0–46.0)
HCT: 25 % — ABNORMAL LOW (ref 36.0–46.0)
HCT: 25 % — ABNORMAL LOW (ref 36.0–46.0)
HCT: 28 % — ABNORMAL LOW (ref 36.0–46.0)
Hemoglobin: 11.9 g/dL — ABNORMAL LOW (ref 12.0–15.0)
Hemoglobin: 10.2 g/dL — ABNORMAL LOW (ref 12.0–15.0)
Hemoglobin: 7.1 g/dL — ABNORMAL LOW (ref 12.0–15.0)
Hemoglobin: 8.5 g/dL — ABNORMAL LOW (ref 12.0–15.0)
Hemoglobin: 8.5 g/dL — ABNORMAL LOW (ref 12.0–15.0)
Hemoglobin: 9.5 g/dL — ABNORMAL LOW (ref 12.0–15.0)
Potassium: 4.3 mmol/L (ref 3.5–5.1)
Potassium: 4 mmol/L (ref 3.5–5.1)
Potassium: 4 mmol/L (ref 3.5–5.1)
Potassium: 5.4 mmol/L — ABNORMAL HIGH (ref 3.5–5.1)
Potassium: 5.4 mmol/L — ABNORMAL HIGH (ref 3.5–5.1)
Potassium: 3.9 mmol/L (ref 3.5–5.1)
Sodium: 140 mmol/L (ref 135–145)
Sodium: 143 mmol/L (ref 135–145)
Sodium: 138 mmol/L (ref 135–145)
Sodium: 144 mmol/L (ref 135–145)
Sodium: 145 mmol/L (ref 135–145)
Sodium: 148 mmol/L — ABNORMAL HIGH (ref 135–145)

## 2018-11-21 LAB — HEMOGLOBIN AND HEMATOCRIT, BLOOD
HCT: 29 % — ABNORMAL LOW (ref 36.0–46.0)
Hemoglobin: 9.4 g/dL — ABNORMAL LOW (ref 12.0–15.0)

## 2018-11-21 LAB — PREPARE RBC (CROSSMATCH)

## 2018-11-21 LAB — PROTIME-INR
INR: 1.7 — ABNORMAL HIGH (ref 0.8–1.2)
Prothrombin Time: 19.4 seconds — ABNORMAL HIGH (ref 11.4–15.2)

## 2018-11-21 LAB — CREATININE, SERUM
Creatinine, Ser: 0.71 mg/dL (ref 0.44–1.00)
GFR calc Af Amer: >60 mL/min (ref >60)
GFR calc non Af Amer: >60 mL/min (ref >60)

## 2018-11-21 LAB — PLATELET COUNT: Platelets: 116 10*3/uL — ABNORMAL LOW (ref 150–400)

## 2018-11-21 LAB — ABO/RH: ABO/RH(D): A POS

## 2018-11-21 LAB — MAGNESIUM
Magnesium: 2.9 mg/dL — ABNORMAL HIGH (ref 1.7–2.4)
Magnesium: 2.9 mg/dL — ABNORMAL HIGH (ref 1.7–2.4)

## 2018-11-21 LAB — APTT: aPTT: 31 seconds (ref 24–36)

## 2018-11-21 SURGERY — CORONARY ARTERY BYPASS GRAFTING (CABG)
Anesthesia: General | Site: Chest

## 2018-11-21 MED ORDER — PHENYLEPHRINE 40 MCG/ML (10ML) SYRINGE FOR IV PUSH (FOR BLOOD PRESSURE SUPPORT)
PREFILLED_SYRINGE | INTRAVENOUS | Status: AC
  Filled 2018-11-21: qty 10

## 2018-11-21 MED ORDER — ROCURONIUM BROMIDE 50 MG/5ML IV SOSY
PREFILLED_SYRINGE | INTRAVENOUS | Status: AC
  Filled 2018-11-21: qty 10

## 2018-11-21 MED ORDER — ARTIFICIAL TEARS OPHTHALMIC OINT
TOPICAL_OINTMENT | OPHTHALMIC | Status: AC
  Filled 2018-11-21: qty 3.5

## 2018-11-21 MED ORDER — FENTANYL CITRATE (PF) 250 MCG/5ML IJ SOLN
INTRAMUSCULAR | Status: DC | PRN
  Administered 2018-11-21: 250 ug via INTRAVENOUS
  Administered 2018-11-21: 150 ug via INTRAVENOUS
  Administered 2018-11-21: 300 ug via INTRAVENOUS
  Administered 2018-11-21: 200 ug via INTRAVENOUS
  Administered 2018-11-21 (×3): 250 ug via INTRAVENOUS
  Administered 2018-11-21: 100 ug via INTRAVENOUS
  Administered 2018-11-21: 200 ug via INTRAVENOUS
  Administered 2018-11-21: 50 ug via INTRAVENOUS

## 2018-11-21 MED ORDER — 0.9 % SODIUM CHLORIDE (POUR BTL) OPTIME
TOPICAL | Status: DC | PRN
  Administered 2018-11-21: 6000 mL

## 2018-11-21 MED ORDER — ALBUMIN HUMAN 5 % IV SOLN
INTRAVENOUS | Status: DC | PRN
  Administered 2018-11-21 (×2): via INTRAVENOUS

## 2018-11-21 MED ORDER — THROMBIN 20000 UNITS EX SOLR
CUTANEOUS | Status: DC | PRN
  Administered 2018-11-21: 20000 [IU] via TOPICAL

## 2018-11-21 MED ORDER — LIDOCAINE 2% (20 MG/ML) 5 ML SYRINGE
INTRAMUSCULAR | Status: AC
  Filled 2018-11-21: qty 5

## 2018-11-21 MED ORDER — ACETAMINOPHEN 500 MG PO TABS
1000.0000 mg | ORAL_TABLET | Freq: Four times a day (QID) | ORAL | Status: AC
  Administered 2018-11-22 – 2018-11-26 (×19): 1000 mg via ORAL
  Filled 2018-11-21 (×20): qty 2

## 2018-11-21 MED ORDER — SODIUM CHLORIDE 0.9% FLUSH: 3.0000 mL | INTRAVENOUS | Status: DC | PRN

## 2018-11-21 MED ORDER — METOPROLOL TARTRATE 12.5 MG HALF TABLET: 12.5000 mg | ORAL_TABLET | Freq: Two times a day (BID) | ORAL | Status: DC

## 2018-11-21 MED ORDER — THROMBIN 20000 UNITS EX SOLR
OROMUCOSAL | Status: DC | PRN
  Administered 2018-11-21 (×3): 4 mL via TOPICAL

## 2018-11-21 MED ORDER — MAGNESIUM SULFATE 4 GM/100ML IV SOLN
4.0000 g | Freq: Once | INTRAVENOUS | Status: AC
  Administered 2018-11-21: 4 g via INTRAVENOUS
  Filled 2018-11-21: qty 100

## 2018-11-21 MED ORDER — ACETAMINOPHEN 650 MG RE SUPP
650.0000 mg | Freq: Once | RECTAL | Status: AC
  Administered 2018-11-21: 650 mg via RECTAL
  Filled 2018-11-21: qty 1

## 2018-11-21 MED ORDER — ONDANSETRON HCL 4 MG/2ML IJ SOLN
4.0000 mg | Freq: Four times a day (QID) | INTRAMUSCULAR | Status: DC | PRN
  Administered 2018-11-25 – 2018-11-26 (×3): 4 mg via INTRAVENOUS
  Filled 2018-11-21 (×3): qty 2

## 2018-11-21 MED ORDER — SODIUM CHLORIDE 0.9 % IV SOLN: INTRAVENOUS | Status: DC

## 2018-11-21 MED ORDER — ACETAMINOPHEN 160 MG/5ML PO SOLN: 650.0000 mg | Freq: Once | ORAL | Status: AC

## 2018-11-21 MED ORDER — VANCOMYCIN HCL IN DEXTROSE 1-5 GM/200ML-% IV SOLN
1000.0000 mg | Freq: Once | INTRAVENOUS | Status: AC
  Administered 2018-11-21: 1000 mg via INTRAVENOUS
  Filled 2018-11-21: qty 200

## 2018-11-21 MED ORDER — TRAMADOL HCL 50 MG PO TABS: 50.0000 mg | ORAL_TABLET | ORAL | Status: DC | PRN

## 2018-11-21 MED ORDER — HEMOSTATIC AGENTS (NO CHARGE) OPTIME
TOPICAL | Status: DC | PRN
  Administered 2018-11-21: 1 via TOPICAL

## 2018-11-21 MED ORDER — FENTANYL CITRATE (PF) 250 MCG/5ML IJ SOLN
INTRAMUSCULAR | Status: AC
  Filled 2018-11-21: qty 10

## 2018-11-21 MED ORDER — MIDAZOLAM HCL 2 MG/2ML IJ SOLN: 2.0000 mg | INTRAMUSCULAR | Status: DC | PRN

## 2018-11-21 MED ORDER — SODIUM CHLORIDE 0.9% FLUSH
3.0000 mL | Freq: Two times a day (BID) | INTRAVENOUS | Status: DC
  Administered 2018-11-22 – 2018-11-26 (×9): 3 mL via INTRAVENOUS

## 2018-11-21 MED ORDER — PLASMA-LYTE 148 IV SOLN
INTRAVENOUS | Status: DC | PRN
  Administered 2018-11-21: 500 mL via INTRAVASCULAR

## 2018-11-21 MED ORDER — BISACODYL 10 MG RE SUPP: 10.0000 mg | Freq: Every day | RECTAL | Status: DC

## 2018-11-21 MED ORDER — ORAL CARE MOUTH RINSE
15.0000 mL | OROMUCOSAL | Status: DC
  Administered 2018-11-21 (×3): 15 mL via OROMUCOSAL

## 2018-11-21 MED ORDER — BISACODYL 5 MG PO TBEC
10.0000 mg | DELAYED_RELEASE_TABLET | Freq: Every day | ORAL | Status: DC
  Administered 2018-11-22 – 2018-11-23 (×2): 10 mg via ORAL
  Filled 2018-11-21 (×3): qty 2

## 2018-11-21 MED ORDER — NOREPINEPHRINE 4 MG/250ML-% IV SOLN
0.0000 ug/min | INTRAVENOUS | Status: DC
  Filled 2018-11-21: qty 250

## 2018-11-21 MED ORDER — MORPHINE SULFATE (PF) 2 MG/ML IV SOLN
1.0000 mg | INTRAVENOUS | Status: DC | PRN
  Administered 2018-11-21: 23:00:00 2 mg via INTRAVENOUS
  Administered 2018-11-22: 4 mg via INTRAVENOUS
  Filled 2018-11-21: qty 2
  Filled 2018-11-21: qty 1

## 2018-11-21 MED ORDER — PHENYLEPHRINE HCL-NACL 20-0.9 MG/250ML-% IV SOLN: 0.0000 ug/min | INTRAVENOUS | Status: DC

## 2018-11-21 MED ORDER — LACTATED RINGERS IV SOLN
INTRAVENOUS | Status: DC | PRN
  Administered 2018-11-21 (×3): via INTRAVENOUS

## 2018-11-21 MED ORDER — HEPARIN SODIUM (PORCINE) 1000 UNIT/ML IJ SOLN
INTRAMUSCULAR | Status: DC | PRN
  Administered 2018-11-21: 29000 [IU] via INTRAVENOUS

## 2018-11-21 MED ORDER — PANTOPRAZOLE SODIUM 40 MG PO TBEC
40.0000 mg | DELAYED_RELEASE_TABLET | Freq: Every day | ORAL | Status: DC
  Administered 2018-11-23 – 2018-11-27 (×5): 40 mg via ORAL
  Filled 2018-11-21 (×5): qty 1

## 2018-11-21 MED ORDER — PROPOFOL 10 MG/ML IV BOLUS
INTRAVENOUS | Status: DC | PRN
  Administered 2018-11-21: 50 mg via INTRAVENOUS

## 2018-11-21 MED ORDER — THROMBIN (RECOMBINANT) 20000 UNITS EX SOLR
CUTANEOUS | Status: AC
  Filled 2018-11-21: qty 20000

## 2018-11-21 MED ORDER — SODIUM CHLORIDE 0.9 % IV SOLN
INTRAVENOUS | Status: DC | PRN
  Administered 2018-11-21: 750 mg via INTRAVENOUS

## 2018-11-21 MED ORDER — ROCURONIUM BROMIDE 10 MG/ML (PF) SYRINGE
PREFILLED_SYRINGE | INTRAVENOUS | Status: DC | PRN
  Administered 2018-11-21: 100 mg via INTRAVENOUS
  Administered 2018-11-21: 50 mg via INTRAVENOUS
  Administered 2018-11-21: 100 mg via INTRAVENOUS

## 2018-11-21 MED ORDER — PHENYLEPHRINE 40 MCG/ML (10ML) SYRINGE FOR IV PUSH (FOR BLOOD PRESSURE SUPPORT)
PREFILLED_SYRINGE | INTRAVENOUS | Status: DC | PRN
  Administered 2018-11-21: 80 ug via INTRAVENOUS
  Administered 2018-11-21 (×3): 40 ug via INTRAVENOUS

## 2018-11-21 MED ORDER — CHLORHEXIDINE GLUCONATE 0.12 % MT SOLN
15.0000 mL | OROMUCOSAL | Status: AC
  Administered 2018-11-21: 15 mL via OROMUCOSAL

## 2018-11-21 MED ORDER — ASPIRIN 81 MG PO CHEW
324.0000 mg | CHEWABLE_TABLET | Freq: Every day | ORAL | Status: DC
  Filled 2018-11-21 (×2): qty 4

## 2018-11-21 MED ORDER — CHLORHEXIDINE GLUCONATE CLOTH 2 % EX PADS
6.0000 | MEDICATED_PAD | Freq: Every day | CUTANEOUS | Status: DC
  Administered 2018-11-21 – 2018-11-24 (×4): 6 via TOPICAL

## 2018-11-21 MED ORDER — PHENYLEPHRINE 40 MCG/ML (10ML) SYRINGE FOR IV PUSH (FOR BLOOD PRESSURE SUPPORT)
PREFILLED_SYRINGE | INTRAVENOUS | Status: AC
  Filled 2018-11-21: qty 20

## 2018-11-21 MED ORDER — SUCCINYLCHOLINE CHLORIDE 200 MG/10ML IV SOSY
PREFILLED_SYRINGE | INTRAVENOUS | Status: AC
  Filled 2018-11-21: qty 10

## 2018-11-21 MED ORDER — DEXMEDETOMIDINE HCL IN NACL 200 MCG/50ML IV SOLN: 0.0000 ug/kg/h | INTRAVENOUS | Status: DC

## 2018-11-21 MED ORDER — INSULIN REGULAR(HUMAN) IN NACL 100-0.9 UT/100ML-% IV SOLN: INTRAVENOUS | Status: DC

## 2018-11-21 MED ORDER — METOPROLOL TARTRATE 5 MG/5ML IV SOLN
2.5000 mg | INTRAVENOUS | Status: DC | PRN
  Administered 2018-11-21: 2.5 mg via INTRAVENOUS
  Administered 2018-11-22 (×2): 5 mg via INTRAVENOUS
  Filled 2018-11-21 (×3): qty 5

## 2018-11-21 MED ORDER — PROTAMINE SULFATE 10 MG/ML IV SOLN
INTRAVENOUS | Status: DC | PRN
  Administered 2018-11-21: 290 mg via INTRAVENOUS

## 2018-11-21 MED ORDER — CHLORHEXIDINE GLUCONATE 0.12% ORAL RINSE (MEDLINE KIT)
15.0000 mL | Freq: Two times a day (BID) | OROMUCOSAL | Status: DC
  Administered 2018-11-21 – 2018-11-25 (×6): 15 mL via OROMUCOSAL

## 2018-11-21 MED ORDER — LACTATED RINGERS IV SOLN: INTRAVENOUS | Status: DC

## 2018-11-21 MED ORDER — FENTANYL CITRATE (PF) 250 MCG/5ML IJ SOLN
INTRAMUSCULAR | Status: AC
  Filled 2018-11-21: qty 30

## 2018-11-21 MED ORDER — METHYLPREDNISOLONE SODIUM SUCC 125 MG IJ SOLR
60.0000 mg | Freq: Once | INTRAMUSCULAR | Status: AC
  Administered 2018-11-21: 60 mg via INTRAVENOUS
  Filled 2018-11-21: qty 2

## 2018-11-21 MED ORDER — INSULIN REGULAR BOLUS VIA INFUSION
0.0000 [IU] | Freq: Three times a day (TID) | INTRAVENOUS | Status: DC
  Filled 2018-11-21: qty 10

## 2018-11-21 MED ORDER — NITROGLYCERIN IN D5W 200-5 MCG/ML-% IV SOLN: 0.0000 ug/min | INTRAVENOUS | Status: DC

## 2018-11-21 MED ORDER — FAMOTIDINE IN NACL 20-0.9 MG/50ML-% IV SOLN
20.0000 mg | Freq: Two times a day (BID) | INTRAVENOUS | Status: AC
  Administered 2018-11-21 (×2): 20 mg via INTRAVENOUS
  Filled 2018-11-21 (×2): qty 50

## 2018-11-21 MED ORDER — ACETAMINOPHEN 160 MG/5ML PO SOLN: 1000.0000 mg | Freq: Four times a day (QID) | ORAL | Status: AC

## 2018-11-21 MED ORDER — METOPROLOL TARTRATE 25 MG/10 ML ORAL SUSPENSION: 12.5000 mg | Freq: Two times a day (BID) | ORAL | Status: DC

## 2018-11-21 MED ORDER — SODIUM CHLORIDE 0.45 % IV SOLN
INTRAVENOUS | Status: DC | PRN
  Administered 2018-11-21: 14:00:00 via INTRAVENOUS
  Administered 2018-11-23: 10 mL/h via INTRAVENOUS

## 2018-11-21 MED ORDER — THROMBIN 20000 UNITS EX SOLR: CUTANEOUS | Status: DC | PRN

## 2018-11-21 MED ORDER — ASPIRIN EC 325 MG PO TBEC
325.0000 mg | DELAYED_RELEASE_TABLET | Freq: Every day | ORAL | Status: DC
  Administered 2018-11-22 – 2018-11-27 (×6): 325 mg via ORAL
  Filled 2018-11-21 (×6): qty 1

## 2018-11-21 MED ORDER — DOCUSATE SODIUM 100 MG PO CAPS
200.0000 mg | ORAL_CAPSULE | Freq: Every day | ORAL | Status: DC
  Administered 2018-11-22 – 2018-11-25 (×4): 200 mg via ORAL
  Filled 2018-11-21 (×4): qty 2

## 2018-11-21 MED ORDER — POTASSIUM CHLORIDE 10 MEQ/50ML IV SOLN
10.0000 meq | INTRAVENOUS | Status: AC
  Administered 2018-11-21 (×2): 10 meq via INTRAVENOUS

## 2018-11-21 MED ORDER — SODIUM CHLORIDE 0.9 % IV SOLN
1.5000 g | Freq: Two times a day (BID) | INTRAVENOUS | Status: AC
  Administered 2018-11-21 – 2018-11-23 (×4): 1.5 g via INTRAVENOUS
  Filled 2018-11-21 (×4): qty 1.5

## 2018-11-21 MED ORDER — PROPOFOL 10 MG/ML IV BOLUS
INTRAVENOUS | Status: AC
  Filled 2018-11-21: qty 20

## 2018-11-21 MED ORDER — NOREPINEPHRINE 4 MG/250ML-% IV SOLN
0.0000 ug/min | INTRAVENOUS | Status: DC
  Administered 2018-11-21: 2 ug/min via INTRAVENOUS
  Filled 2018-11-21: qty 250

## 2018-11-21 MED ORDER — ARTIFICIAL TEARS OPHTHALMIC OINT
TOPICAL_OINTMENT | OPHTHALMIC | Status: DC | PRN
  Administered 2018-11-21: 1 via OPHTHALMIC

## 2018-11-21 MED ORDER — SUCCINYLCHOLINE CHLORIDE 20 MG/ML IJ SOLN
INTRAMUSCULAR | Status: DC | PRN
  Administered 2018-11-21: 80 mg via INTRAVENOUS

## 2018-11-21 MED ORDER — ALBUMIN HUMAN 5 % IV SOLN
250.0000 mL | INTRAVENOUS | Status: AC | PRN
  Administered 2018-11-21 (×4): 12.5 g via INTRAVENOUS
  Filled 2018-11-21 (×2): qty 500

## 2018-11-21 MED ORDER — LACTATED RINGERS IV SOLN
500.0000 mL | Freq: Once | INTRAVENOUS | Status: AC | PRN
  Administered 2018-11-21: 500 mL via INTRAVENOUS

## 2018-11-21 MED ORDER — MIDAZOLAM HCL (PF) 10 MG/2ML IJ SOLN
INTRAMUSCULAR | Status: AC
  Filled 2018-11-21: qty 2

## 2018-11-21 MED ORDER — SODIUM CHLORIDE 0.9 % IV SOLN: 250.0000 mL | INTRAVENOUS | Status: DC

## 2018-11-21 MED ORDER — OXYCODONE HCL 5 MG PO TABS
5.0000 mg | ORAL_TABLET | ORAL | Status: DC | PRN
  Administered 2018-11-22: 5 mg via ORAL
  Administered 2018-11-22 (×2): 10 mg via ORAL
  Administered 2018-11-22 (×2): 5 mg via ORAL
  Administered 2018-11-23 (×4): 10 mg via ORAL
  Administered 2018-11-23: 5 mg via ORAL
  Administered 2018-11-24 – 2018-11-25 (×7): 10 mg via ORAL
  Administered 2018-11-25: 5 mg via ORAL
  Administered 2018-11-25 – 2018-11-27 (×16): 10 mg via ORAL
  Filled 2018-11-21: qty 1
  Filled 2018-11-21 (×3): qty 2
  Filled 2018-11-21: qty 1
  Filled 2018-11-21 (×8): qty 2
  Filled 2018-11-21: qty 1
  Filled 2018-11-21 (×20): qty 2

## 2018-11-21 MED ORDER — MIDAZOLAM HCL 5 MG/5ML IJ SOLN
INTRAMUSCULAR | Status: DC | PRN
  Administered 2018-11-21: 5 mg via INTRAVENOUS
  Administered 2018-11-21: 3 mg via INTRAVENOUS
  Administered 2018-11-21 (×2): 1 mg via INTRAVENOUS

## 2018-11-21 MED FILL — Magnesium Sulfate Inj 50%: INTRAMUSCULAR | Qty: 10 | Status: AC

## 2018-11-21 MED FILL — Potassium Chloride Inj 2 mEq/ML: INTRAVENOUS | Qty: 40 | Status: AC

## 2018-11-21 MED FILL — Heparin Sodium (Porcine) Inj 1000 Unit/ML: INTRAMUSCULAR | Qty: 30 | Status: AC

## 2018-11-21 SURGICAL SUPPLY — 103 items
BAG DECANTER FOR FLEXI CONT (MISCELLANEOUS) ×4 IMPLANT
BANDAGE ACE 4X5 VEL STRL LF (GAUZE/BANDAGES/DRESSINGS) ×4 IMPLANT
BANDAGE ACE 6X5 VEL STRL LF (GAUZE/BANDAGES/DRESSINGS) ×4 IMPLANT
BANDAGE ELASTIC 4 VELCRO ST LF (GAUZE/BANDAGES/DRESSINGS) ×4 IMPLANT
BANDAGE ELASTIC 6 VELCRO ST LF (GAUZE/BANDAGES/DRESSINGS) ×4 IMPLANT
BASKET HEART  (ORDER IN 25'S) (MISCELLANEOUS) ×1
BASKET HEART (ORDER IN 25'S) (MISCELLANEOUS) ×1
BASKET HEART (ORDER IN 25S) (MISCELLANEOUS) ×2 IMPLANT
BLADE STERNUM SYSTEM 6 (BLADE) ×4 IMPLANT
BNDG GAUZE ELAST 4 BULKY (GAUZE/BANDAGES/DRESSINGS) ×4 IMPLANT
CANISTER SUCT 3000ML PPV (MISCELLANEOUS) ×4 IMPLANT
CATH ROBINSON RED A/P 18FR (CATHETERS) ×8 IMPLANT
CATH THORACIC 28FR (CATHETERS) ×4 IMPLANT
CATH THORACIC 36FR (CATHETERS) ×4 IMPLANT
CATH THORACIC 36FR RT ANG (CATHETERS) ×4 IMPLANT
CLIP VESOCCLUDE MED 24/CT (CLIP) IMPLANT
CLIP VESOCCLUDE SM WIDE 24/CT (CLIP) ×4 IMPLANT
COVER WAND RF STERILE (DRAPES) ×4 IMPLANT
CRADLE DONUT ADULT HEAD (MISCELLANEOUS) ×4 IMPLANT
DRAPE CARDIOVASCULAR INCISE (DRAPES) ×2
DRAPE SLUSH/WARMER DISC (DRAPES) ×4 IMPLANT
DRAPE SRG 135X102X78XABS (DRAPES) ×2 IMPLANT
DRSG COVADERM 4X14 (GAUZE/BANDAGES/DRESSINGS) ×4 IMPLANT
ELECT CAUTERY BLADE 6.4 (BLADE) ×4 IMPLANT
ELECT REM PT RETURN 9FT ADLT (ELECTROSURGICAL) ×8
ELECTRODE REM PT RTRN 9FT ADLT (ELECTROSURGICAL) ×4 IMPLANT
FELT TEFLON 1X6 (MISCELLANEOUS) ×8 IMPLANT
GAUZE SPONGE 4X4 12PLY STRL (GAUZE/BANDAGES/DRESSINGS) ×8 IMPLANT
GLOVE BIO SURGEON STRL SZ 6 (GLOVE) IMPLANT
GLOVE BIO SURGEON STRL SZ 6.5 (GLOVE) ×18 IMPLANT
GLOVE BIO SURGEON STRL SZ7 (GLOVE) IMPLANT
GLOVE BIO SURGEON STRL SZ7.5 (GLOVE) ×8 IMPLANT
GLOVE BIO SURGEONS STRL SZ 6.5 (GLOVE) ×6
GLOVE BIOGEL PI IND STRL 6 (GLOVE) ×2 IMPLANT
GLOVE BIOGEL PI IND STRL 6.5 (GLOVE) IMPLANT
GLOVE BIOGEL PI IND STRL 7.0 (GLOVE) IMPLANT
GLOVE BIOGEL PI INDICATOR 6 (GLOVE) ×2
GLOVE BIOGEL PI INDICATOR 6.5 (GLOVE)
GLOVE BIOGEL PI INDICATOR 7.0 (GLOVE)
GLOVE EUDERMIC 7 POWDERFREE (GLOVE) ×8 IMPLANT
GLOVE ORTHO TXT STRL SZ7.5 (GLOVE) IMPLANT
GOWN STRL REUS W/ TWL LRG LVL3 (GOWN DISPOSABLE) ×16 IMPLANT
GOWN STRL REUS W/ TWL XL LVL3 (GOWN DISPOSABLE) ×2 IMPLANT
GOWN STRL REUS W/TWL LRG LVL3 (GOWN DISPOSABLE) ×16
GOWN STRL REUS W/TWL XL LVL3 (GOWN DISPOSABLE) ×2
HEMOSTAT POWDER SURGIFOAM 1G (HEMOSTASIS) ×12 IMPLANT
HEMOSTAT SURGICEL 2X14 (HEMOSTASIS) ×4 IMPLANT
INSERT FOGARTY 61MM (MISCELLANEOUS) IMPLANT
INSERT FOGARTY XLG (MISCELLANEOUS) IMPLANT
KIT BASIN OR (CUSTOM PROCEDURE TRAY) ×4 IMPLANT
KIT CATH CPB BARTLE (MISCELLANEOUS) ×4 IMPLANT
KIT SUCTION CATH 14FR (SUCTIONS) ×4 IMPLANT
KIT TURNOVER KIT B (KITS) ×4 IMPLANT
KIT VASOVIEW HEMOPRO 2 VH 4000 (KITS) ×4 IMPLANT
NS IRRIG 1000ML POUR BTL (IV SOLUTION) ×24 IMPLANT
PACK E OPEN HEART (SUTURE) ×4 IMPLANT
PACK OPEN HEART (CUSTOM PROCEDURE TRAY) ×4 IMPLANT
PAD ARMBOARD 7.5X6 YLW CONV (MISCELLANEOUS) ×8 IMPLANT
PAD ELECT DEFIB RADIOL ZOLL (MISCELLANEOUS) ×4 IMPLANT
PENCIL BUTTON HOLSTER BLD 10FT (ELECTRODE) ×4 IMPLANT
PUNCH AORTIC ROTATE 4.0MM (MISCELLANEOUS) IMPLANT
PUNCH AORTIC ROTATE 4.5MM 8IN (MISCELLANEOUS) ×4 IMPLANT
PUNCH AORTIC ROTATE 5MM 8IN (MISCELLANEOUS) IMPLANT
SET CARDIOPLEGIA MPS 5001102 (MISCELLANEOUS) ×4 IMPLANT
SPONGE INTESTINAL PEANUT (DISPOSABLE) IMPLANT
SPONGE LAP 18X18 RF (DISPOSABLE) IMPLANT
SPONGE LAP 18X18 X RAY DECT (DISPOSABLE) ×8 IMPLANT
SPONGE LAP 4X18 RFD (DISPOSABLE) ×8 IMPLANT
SUT BONE WAX W31G (SUTURE) ×4 IMPLANT
SUT MNCRL AB 4-0 PS2 18 (SUTURE) IMPLANT
SUT PROLENE 3 0 SH DA (SUTURE) IMPLANT
SUT PROLENE 3 0 SH1 36 (SUTURE) ×8 IMPLANT
SUT PROLENE 4 0 RB 1 (SUTURE)
SUT PROLENE 4 0 SH DA (SUTURE) IMPLANT
SUT PROLENE 4-0 RB1 .5 CRCL 36 (SUTURE) IMPLANT
SUT PROLENE 5 0 C 1 36 (SUTURE) IMPLANT
SUT PROLENE 6 0 C 1 30 (SUTURE) ×4 IMPLANT
SUT PROLENE 7 0 BV 1 (SUTURE) IMPLANT
SUT PROLENE 7 0 BV1 MDA (SUTURE) ×8 IMPLANT
SUT PROLENE 8 0 BV175 6 (SUTURE) ×4 IMPLANT
SUT SILK  1 MH (SUTURE)
SUT SILK 1 MH (SUTURE) IMPLANT
SUT SILK 2 0 SH (SUTURE) IMPLANT
SUT STEEL STERNAL CCS#1 18IN (SUTURE) ×8 IMPLANT
SUT STEEL SZ 6 DBL 3X14 BALL (SUTURE) IMPLANT
SUT VIC AB 1 CTX 36 (SUTURE) ×6
SUT VIC AB 1 CTX36XBRD ANBCTR (SUTURE) ×6 IMPLANT
SUT VIC AB 2-0 CT1 27 (SUTURE) ×4
SUT VIC AB 2-0 CT1 TAPERPNT 27 (SUTURE) ×4 IMPLANT
SUT VIC AB 2-0 CTX 27 (SUTURE) IMPLANT
SUT VIC AB 3-0 SH 27 (SUTURE)
SUT VIC AB 3-0 SH 27X BRD (SUTURE) IMPLANT
SUT VIC AB 3-0 X1 27 (SUTURE) ×12 IMPLANT
SUT VICRYL 4-0 PS2 18IN ABS (SUTURE) IMPLANT
SYSTEM SAHARA CHEST DRAIN ATS (WOUND CARE) ×4 IMPLANT
TAPE CLOTH SURG 4X10 WHT LF (GAUZE/BANDAGES/DRESSINGS) ×4 IMPLANT
TAPE PAPER 2X10 WHT MICROPORE (GAUZE/BANDAGES/DRESSINGS) ×4 IMPLANT
TOWEL GREEN STERILE (TOWEL DISPOSABLE) ×4 IMPLANT
TOWEL GREEN STERILE FF (TOWEL DISPOSABLE) ×4 IMPLANT
TRAY FOLEY SLVR 16FR TEMP STAT (SET/KITS/TRAYS/PACK) ×4 IMPLANT
TUBING LAP HI FLOW INSUFFLATIO (TUBING) ×4 IMPLANT
UNDERPAD 30X30 (UNDERPADS AND DIAPERS) ×4 IMPLANT
WATER STERILE IRR 1000ML POUR (IV SOLUTION) ×8 IMPLANT

## 2018-11-21 NOTE — Progress Notes (Signed)
RT NOTE: RT attempted rapid wean protocol. Patient noticeably tired and attempted VC and NIF but was unable to obtain needed measurements for extubation at this time. Patient is back on SIMV/PRVC/PSV and RT will attempt rapid wean protocol again soon. RT will continue to monitor.

## 2018-11-21 NOTE — Progress Notes (Signed)
      [address]       [place] [address]             [phone]      S/p CABG x 3  Intubated, sedated  BP (!) 107/53   Pulse 85   Temp (!) 97.5 F (36.4 C)   Resp 14   Ht 5\' 5"  (1.651 m)   Wt 82.9 kg   SpO2 100%   BMI 30.40 kg/m  35/15 Ci= 2.2 on levophed at 2 mcg/min   Intake/Output Summary (Last 24 hours) at 11/21/2018 1652 Last data filed at 11/21/2018 1600 Gross per 24 hour  Intake 4713.98 ml  Output 4335 ml  Net 378.98 ml   K= 5.5, creatinine 0.6 Hct= 29 PLT 72 K but no bleeding- follow  Viviann Spare C. Dorris Fetch, MD [facility] [phone]

## 2018-11-21 NOTE — Anesthesia Procedure Notes (Signed)
Procedure Name: Intubation Date/Time: 11/21/2018 8:08 AM Performed by: Rosiland Oz, CRNA Pre-anesthesia Checklist: Patient identified, Emergency Drugs available, Suction available, Patient being monitored and Timeout performed Patient Re-evaluated:Patient Re-evaluated prior to induction Oxygen Delivery Method: Circle system utilized Preoxygenation: Pre-oxygenation with 100% oxygen Induction Type: IV induction and Rapid sequence Laryngoscope Size: Miller and 3 Grade View: Grade I Tube type: Oral Tube size: 7.5 mm Number of attempts: 1 Airway Equipment and Method: Stylet Placement Confirmation: ETT inserted through vocal cords under direct vision,  positive ETCO2 and breath sounds checked- equal and bilateral Secured at: 23 cm Tube secured with: Tape Dental Injury: Teeth and Oropharynx as per pre-operative assessment

## 2018-11-21 NOTE — Progress Notes (Signed)
Spoke to Dr. Dorris Fetch, per respiratory, patient did not have a cuff leak during weaning trail. He ordered to extubate.

## 2018-11-21 NOTE — Procedures (Signed)
Extubation Procedure Note  Patient Details:   Name: Monique [name] DOB: [birth year] MRN: [id number]   Airway Documentation:  Airway 7.5 mm (Active)  Secured at (cm) 22 cm 11/21/2018 10:16 PM  Measured From Lips 11/21/2018 10:16 PM  Secured Location Right 11/21/2018 10:16 PM  Secured By Caron Presume Tape 11/21/2018 10:16 PM  Cuff Pressure (cm H2O) 28 cm H2O 11/21/2018  7:51 PM  Site Condition Dry 11/21/2018 10:16 PM   Vent end date: (not recorded) Vent end time: (not recorded)   Evaluation  O2 sats: stable throughout Complications: No apparent complications Patient did tolerate procedure well. Bilateral Breath Sounds: Diminished   Yes   Extubated per MD orders patient did not have cuff leak, vitals stable throughout procedure. No stridor noted, bilateral diminished breathe sounds noted. Vital capacity and NIF within protocol.  Patient placed on 4L nasal cannula.   Sheilia Reznick R Jylan Loeza 11/21/2018, 11:05 PM

## 2018-11-21 NOTE — Anesthesia Preprocedure Evaluation (Addendum)
Anesthesia Evaluation  Patient identified by MRN, date of birth, ID band Patient awake    Reviewed: Allergy & Precautions, NPO status , Patient's Chart, lab work & pertinent test results, reviewed documented beta blocker date and time   Airway Mallampati: II  TM Distance: >3 FB Neck ROM: Full    Dental no notable dental hx.    Pulmonary asthma ,    Pulmonary exam normal breath sounds clear to auscultation       Cardiovascular hypertension, Pt. on home beta blockers and Pt. on medications + angina + CAD  Normal cardiovascular exam Rhythm:Regular Rate:Normal  ECG: NSR, rate 91  ECHO:  1. The left ventricle has hyperdynamic systolic function, with an ejection fraction of >65%. The cavity size was normal. There is mild concentric left ventricular hypertrophy. Left ventricular diastolic Doppler parameters are consistent with impaired  relaxation. No evidence of left ventricular regional wall motion abnormalities. 2. The right ventricle has normal systolic function. The cavity was normal. There is no increase in right ventricular wall thickness. 3. The aortic valve is tricuspid. Mild sclerosis of the aortic valve. Aortic valve regurgitation was not assessed by color flow Doppler. 4. The interatrial septum appears to be lipomatous.  CATH: Ost LAD to Prox LAD lesion is 95% stenosed. Lat Ramus lesion is 90% stenosed. Ramus lesion is 90% stenosed.   Neuro/Psych PSYCHIATRIC DISORDERS negative neurological ROS     GI/Hepatic Neg liver ROS, hiatal hernia,   Endo/Other  negative endocrine ROS  Renal/GU negative Renal ROS     Musculoskeletal negative musculoskeletal ROS (+) HLD   Abdominal (+) + obese,   Peds  Hematology negative hematology ROS (+)   Anesthesia Other Findings CAD  Reproductive/Obstetrics                            Anesthesia Physical Anesthesia Plan  ASA: IV  Anesthesia Plan:  General   Post-op Pain Management:    Induction: Intravenous  PONV Risk Score and Plan: 3 and Ondansetron, Dexamethasone, Midazolam and Treatment may vary due to age or medical condition  Airway Management Planned: Oral ETT  Additional Equipment: Arterial line, CVP, PA Cath, TEE and Ultrasound Guidance Line Placement  Intra-op Plan:   Post-operative Plan: Post-operative intubation/ventilation  Informed Consent: I have reviewed the patients History and Physical, chart, labs and discussed the procedure including the risks, benefits and alternatives for the proposed anesthesia with the patient or authorized representative who has indicated his/her understanding and acceptance.     Dental advisory given  Plan Discussed with: CRNA  Anesthesia Plan Comments:         Anesthesia Quick Evaluation

## 2018-11-21 NOTE — Progress Notes (Addendum)
  Echocardiogram Echocardiogram Transesophageal has been performed.  Chia Rock L Androw 11/21/2018, 8:32 AM

## 2018-11-21 NOTE — Progress Notes (Signed)
RT NOTE: attempted rapid wean protocol, patient was checked for a cuff leak and she was negative for a leak. RN advised, awaiting call back from MD.  Will attempt rapid wean again.

## 2018-11-21 NOTE — Progress Notes (Signed)
Patient ID: Monique [name], female   DOB: [birth year], 78 y.o.   MRN: [id number] TCTS:  She had chest pressure yesterday sitting on side of bed relieved with increased NTG and had some brief chest pain this am. Plan to proceed with CABG this am.

## 2018-11-21 NOTE — Anesthesia Procedure Notes (Signed)
Arterial Line Insertion Start/End4/20/2020 6:45 AM, 11/21/2018 7:00 AM Performed by: Rosiland Oz, CRNA, CRNA  Preanesthetic checklist: patient identified, IV checked, site marked, risks and benefits discussed, surgical consent, monitors and equipment checked, pre-op evaluation, timeout performed and anesthesia consent Lidocaine 1% used for infiltration Left, radial was placed Catheter size: 20 G Hand hygiene performed , maximum sterile barriers used  and Seldinger technique used  Attempts: 1 Procedure performed without using ultrasound guided technique. Following insertion, dressing applied and Biopatch. Post procedure assessment: normal and unchanged  Patient tolerated the procedure well with no immediate complications.

## 2018-11-21 NOTE — Brief Op Note (Signed)
11/17/2018 - 11/21/2018  12:54 PM  PATIENT:  Monique [name]  78 y.o. female  PRE-OPERATIVE DIAGNOSIS:  CAD  POST-OPERATIVE DIAGNOSIS:  CAD  PROCEDURE:  Procedure(s): CORONARY ARTERY BYPASS GRAFTING (CABG) x Three , using left internal mammary artery and right leg greater saphenous vein harvested endoscopically (N/A) TRANSESOPHAGEAL ECHOCARDIOGRAM (TEE) (N/A)  SURGEON:  Surgeon(s) and Role:    * Bartle, Payton Doughty, MD - Primary  PHYSICIAN ASSISTANT: WAYNE GOLD PA-C  ANESTHESIA:   general  EBL:  800 mL   BLOOD ADMINISTERED:250 CC PRBC  DRAINS: ROUTINE PLEURAL AND PERICARDIAL CHEST DRAINS   LOCAL MEDICATIONS USED:  NONE  SPECIMEN:  No Specimen  DISPOSITION OF SPECIMEN:  N/A  COUNTS:  YES  TOURNIQUET:  * No tourniquets in log *  DICTATION: .Dragon Dictation  PLAN OF CARE: Admit to inpatient   PATIENT DISPOSITION:  ICU - intubated and hemodynamically stable.   Delay start of Pharmacological VTE agent (>24hrs) due to surgical blood loss or risk of bleeding: yes

## 2018-11-21 NOTE — Plan of Care (Signed)
  Problem: Education: Goal: Knowledge of General Education information will improve Description Including pain rating scale, medication(s)/side effects and non-pharmacologic comfort measures Outcome: Progressing Note:  Patient is oriented to the unit and hospital.   Problem: Clinical Measurements: Goal: Ability to maintain clinical measurements within normal limits will improve Outcome: Progressing Note:  VSS. Goal: Will remain free from infection Outcome: Progressing Note:  No s/s of infection noted.

## 2018-11-21 NOTE — Anesthesia Postprocedure Evaluation (Signed)
Anesthesia Post Note  Patient: Monique [name]  Procedure(s) Performed: CORONARY ARTERY BYPASS GRAFTING (CABG) x Three , using left internal mammary artery and right leg greater saphenous vein harvested endoscopically (N/A Chest) TRANSESOPHAGEAL ECHOCARDIOGRAM (TEE) (N/A )     Patient location during evaluation: SICU Anesthesia Type: General Level of consciousness: sedated Pain management: pain level controlled Vital Signs Assessment: post-procedure vital signs reviewed and stable Respiratory status: patient remains intubated per anesthesia plan Cardiovascular status: stable Postop Assessment: no apparent nausea or vomiting Anesthetic complications: no    Last Vitals:  Vitals:   11/21/18 1645 11/21/18 1700  BP:  (!) 134/56  Pulse: 85 90  Resp: 14 14  Temp: (!) 36.4 C 36.7 C  SpO2: 100% 100%    Last Pain:  Vitals:   11/21/18 1345  TempSrc: Core  PainSc:                  Catheryn Bacon Mistee Soliman

## 2018-11-21 NOTE — Op Note (Signed)
CARDIOVASCULAR SURGERY OPERATIVE NOTE  11/21/2018  Surgeon:  Alleen BorneBryan K. Bartle, MD  First Assistant: Gershon CraneWayne Gold, PA-C   Preoperative Diagnosis:  Severe multi-vessel coronary artery disease   Postoperative Diagnosis:  Same   Procedure:  1. Median Sternotomy 2. Extracorporeal circulation 3.   Coronary artery bypass grafting x 3   Left internal mammary graft to the LAD  SVG to Ramus  SVG to OM  4.   Endoscopic vein harvest from the right leg   Anesthesia:  General Endotracheal   Clinical History/Surgical Indication:  The patient is a 78 year old woman with history of hypertension, hyperlipidemia, and coronary artery disease status post PCI of the proximal LAD with a drug-eluting stent in October 2019.  She had started having exertional substernal chest discomfort in August 2019 and was referred to Dr. Allyson SabalBerry by her PCP.  She underwent diagnostic cardiac catheterization on 05/22/2018 showing a high-grade proximal LAD stenosis that was treated with a drug-eluting stent.  There is also 80% stenosis of a intermediate branch that was untreated.  She said that her symptoms resolved after the cardiac catheterization and she felt fairly well until about 6 weeks ago when she developed recurrent exertional substernal chest discomfort as well as shortness of breath.  The symptoms have progressed and are now occurring daily and waking her up from sleep.  It is gotten the point where just walking across the room will call substernal chest discomfort.  She underwent cardiac catheterization today which showed a 95% in-stent restenosis within the proximal LAD stent.  It was noted that there was an area of incomplete reexpansion of the initial stent which is the likely reason for restenosis.  She also has 95% proximal stenosis of a moderate sized ramus branch and a 95% stenosis of a moderate sized obtuse marginal  branch.  The right coronary artery has no significant disease.  2D echocardiogram has been done and shows normal left ventricular systolic function with no significant valvular abnormality.    Preparation:  The patient was seen in the preoperative holding area and the correct patient, correct operation were confirmed with the patient after reviewing the medical record and catheterization. The consent was signed by me. Preoperative antibiotics were given. A pulmonary arterial line and radial arterial line were placed by the anesthesia team. The patient was taken back to the operating room and positioned supine on the operating room table. After being placed under general endotracheal anesthesia by the anesthesia team a foley catheter was placed. The neck, chest, abdomen, and both legs were prepped with betadine soap and solution and draped in the usual sterile manner. A surgical time-out was taken and the correct patient and operative procedure were confirmed with the nursing and anesthesia staff.   Cardiopulmonary Bypass:  A median sternotomy was performed. The pericardium was opened in the midline. Right ventricular function appeared normal. The ascending aorta was of normal size and had no palpable plaque. There were no contraindications to aortic cannulation or cross-clamping. The patient was fully systemically heparinized and the ACT was maintained > 400 sec. The proximal aortic arch was cannulated with a 20 F aortic cannula for arterial inflow. Venous cannulation was performed via the right atrial appendage using a two-staged venous cannula. An antegrade cardioplegia/vent cannula was inserted into the mid-ascending aorta. Aortic occlusion was performed with a single cross-clamp. Systemic cooling to 32 degrees Centigrade and topical cooling of the heart with iced saline were used. Hyperkalemic antegrade cold blood cardioplegia was used to induce diastolic arrest  and was then given at about 20 minute  intervals throughout the period of arrest to maintain myocardial temperature at or below 10 degrees centigrade. A temperature probe was inserted into the interventricular septum and an insulating pad was placed in the pericardium.   Left internal mammary harvest:  The left side of the sternum was retracted using the Rultract retractor. The left internal mammary artery was harvested as a pedicle graft. All side branches were clipped. It was a medium-sized vessel of good quality with excellent blood flow. It was ligated distally and divided. It was sprayed with topical papaverine solution to prevent vasospasm.   Endoscopic vein harvest:  The right greater saphenous vein was harvested endoscopically through a 2 cm incision medial to the right knee. It was harvested from the upper thigh to below the knee. It was a medium-sized vein of good quality. The side branches were all ligated with 4-0 silk ties.    Coronary arteries:  The coronary arteries were examined.   LAD:  Large vessel with segmental mid vessel disease. Proximal portion intramyocardial.   LCX:  Moderate sized Ramus that has no significant distal disease. OM is intramyocardial but located proximally and minimal disease at that site.  RCA:  No stenosis on cath.   Grafts:  1. LIMA to the LAD: 2.5 mm. It was sewn end to side using 8-0 prolene continuous suture. 2. SVG to Ramus:  1.75 mm. It was sewn end to side using 7-0 prolene continuous suture. 3. SVG to OM:  1.75 mm. It was sewn end to side using 7-0 prolene continuous suture.   The proximal vein graft anastomoses were performed to the mid-ascending aorta using continuous 6-0 prolene suture. Graft markers were placed around the proximal anastomoses.   Completion:  The patient was rewarmed to 37 degrees Centigrade. The clamp was removed from the LIMA pedicle and there was rapid warming of the septum and return of ventricular fibrillation. The crossclamp was removed with a  time of 78 minutes. There was spontaneous return of sinus rhythm. The distal and proximal anastomoses were checked for hemostasis. The position of the grafts was satisfactory. Two temporary epicardial pacing wires were placed on the right atrium and two on the right ventricle. The patient was weaned from CPB without difficulty on no inotropes. CPB time was 109 minutes. Cardiac output was 4 LPM. TEE showed normal LV systolic function. Heparin was fully reversed with protamine and the aortic and venous cannulas removed. Hemostasis was achieved. Mediastinal and left pleural drainage tubes were placed. The sternum was closed with  #6 stainless steel wires. The fascia was closed with continuous # 1 vicryl suture. The subcutaneous tissue was closed with 2-0 vicryl continuous suture. The skin was closed with 3-0 vicryl subcuticular suture. All sponge, needle, and instrument counts were reported correct at the end of the case. Dry sterile dressings were placed over the incisions and around the chest tubes which were connected to pleurevac suction. The patient was then transported to the surgical intensive care unit in stable condition.

## 2018-11-21 NOTE — Anesthesia Procedure Notes (Signed)
Central Venous Catheter Insertion Performed by: Murvin Natal, MD, anesthesiologist Start/End4/20/2020 7:15 AM, 11/21/2018 7:35 AM Patient location: Pre-op. Preanesthetic checklist: patient identified, IV checked, site marked, risks and benefits discussed, surgical consent, monitors and equipment checked, pre-op evaluation, timeout performed and anesthesia consent Position: Trendelenburg Lidocaine 1% used for infiltration and patient sedated Hand hygiene performed , maximum sterile barriers used  and Seldinger technique used Catheter size: 9 Fr Total catheter length 12. PA cath was placed.MAC introducer Swan type:thermodilution PA Cath depth:48 Procedure performed using ultrasound guided technique. Ultrasound Notes:anatomy identified, needle tip was noted to be adjacent to the nerve/plexus identified and no ultrasound evidence of intravascular and/or intraneural injection Attempts: 1 Following insertion, line sutured, dressing applied and Biopatch. Post procedure assessment: blood return through all ports, free fluid flow and no air  Patient tolerated the procedure well with no immediate complications.

## 2018-11-21 NOTE — Transfer of Care (Signed)
Immediate Anesthesia Transfer of Care Note  Patient: Monique [name]  Procedure(s) Performed: CORONARY ARTERY BYPASS GRAFTING (CABG) x Three , using left internal mammary artery and right leg greater saphenous vein harvested endoscopically (N/A Chest) TRANSESOPHAGEAL ECHOCARDIOGRAM (TEE) (N/A )  Patient Location: PACU  Anesthesia Type:General  Level of Consciousness: sedated, patient cooperative and Patient remains intubated per anesthesia plan  Airway & Oxygen Therapy: Patient remains intubated per anesthesia plan and Patient placed on Ventilator (see vital sign flow sheet for setting)  Post-op Assessment: Report given to RN and Post -op Vital signs reviewed and stable  Post vital signs: Reviewed and stable  Last Vitals:  Vitals Value Taken Time  BP    Temp 35.9 C 11/21/2018  1:52 PM  Pulse 87 11/21/2018  1:52 PM  Resp 13 11/21/2018  1:52 PM  SpO2 98 % 11/21/2018  1:52 PM  Vitals shown include unvalidated device data.  Last Pain:  Vitals:   11/21/18 0425  TempSrc: Oral  PainSc:       Patients Stated Pain Goal: 0 (11/20/18 2040)  Complications: No apparent anesthesia complications

## 2018-11-22 ENCOUNTER — Encounter (HOSPITAL_COMMUNITY): Payer: Self-pay | Admitting: Surgery

## 2018-11-22 ENCOUNTER — Inpatient Hospital Stay (HOSPITAL_COMMUNITY): Payer: Medicare Other

## 2018-11-22 LAB — GLUCOSE, CAPILLARY
Glucose-Capillary: 105 mg/dL — ABNORMAL HIGH (ref 70–99)
Glucose-Capillary: 110 mg/dL — ABNORMAL HIGH (ref 70–99)
Glucose-Capillary: 112 mg/dL — ABNORMAL HIGH (ref 70–99)
Glucose-Capillary: 112 mg/dL — ABNORMAL HIGH (ref 70–99)
Glucose-Capillary: 112 mg/dL — ABNORMAL HIGH (ref 70–99)
Glucose-Capillary: 114 mg/dL — ABNORMAL HIGH (ref 70–99)
Glucose-Capillary: 116 mg/dL — ABNORMAL HIGH (ref 70–99)
Glucose-Capillary: 119 mg/dL — ABNORMAL HIGH (ref 70–99)
Glucose-Capillary: 126 mg/dL — ABNORMAL HIGH (ref 70–99)
Glucose-Capillary: 143 mg/dL — ABNORMAL HIGH (ref 70–99)
Glucose-Capillary: 144 mg/dL — ABNORMAL HIGH (ref 70–99)
Glucose-Capillary: 147 mg/dL — ABNORMAL HIGH (ref 70–99)
Glucose-Capillary: 179 mg/dL — ABNORMAL HIGH (ref 70–99)
Glucose-Capillary: 226 mg/dL — ABNORMAL HIGH (ref 70–99)
Glucose-Capillary: 85 mg/dL (ref 70–99)
Glucose-Capillary: 126 mg/dL — ABNORMAL HIGH (ref 70–99)
Glucose-Capillary: 111 mg/dL — ABNORMAL HIGH (ref 70–99)
Glucose-Capillary: 146 mg/dL — ABNORMAL HIGH (ref 70–99)
Glucose-Capillary: 143 mg/dL — ABNORMAL HIGH (ref 70–99)
Glucose-Capillary: 113 mg/dL — ABNORMAL HIGH (ref 70–99)
Glucose-Capillary: 83 mg/dL (ref 70–99)
Glucose-Capillary: 171 mg/dL — ABNORMAL HIGH (ref 70–99)
Glucose-Capillary: 147 mg/dL — ABNORMAL HIGH (ref 70–99)

## 2018-11-22 LAB — BASIC METABOLIC PANEL
Anion gap: 7 (ref 5–15)
Anion gap: 11 (ref 5–15)
BUN: 14 mg/dL (ref 8–23)
BUN: 18 mg/dL (ref 8–23)
CO2: 24 mmol/L (ref 22–32)
CO2: 22 mmol/L (ref 22–32)
Calcium: 7.9 mg/dL — ABNORMAL LOW (ref 8.9–10.3)
Calcium: 8.2 mg/dL — ABNORMAL LOW (ref 8.9–10.3)
Chloride: 113 mmol/L — ABNORMAL HIGH (ref 98–111)
Chloride: 107 mmol/L (ref 98–111)
Creatinine, Ser: 0.57 mg/dL (ref 0.44–1.00)
Creatinine, Ser: 0.78 mg/dL (ref 0.44–1.00)
GFR calc Af Amer: >60 mL/min (ref >60)
GFR calc Af Amer: >60 mL/min (ref >60)
GFR calc non Af Amer: >60 mL/min (ref >60)
GFR calc non Af Amer: >60 mL/min (ref >60)
Glucose, Bld: 131 mg/dL — ABNORMAL HIGH (ref 70–99)
Glucose, Bld: 198 mg/dL — ABNORMAL HIGH (ref 70–99)
Potassium: 3.9 mmol/L (ref 3.5–5.1)
Potassium: 3.7 mmol/L (ref 3.5–5.1)
Sodium: 144 mmol/L (ref 135–145)
Sodium: 140 mmol/L (ref 135–145)

## 2018-11-22 LAB — CBC
HCT: 25.4 % — ABNORMAL LOW (ref 36.0–46.0)
HCT: 25.1 % — ABNORMAL LOW (ref 36.0–46.0)
Hemoglobin: 8.4 g/dL — ABNORMAL LOW (ref 12.0–15.0)
Hemoglobin: 8.1 g/dL — ABNORMAL LOW (ref 12.0–15.0)
MCH: 31.2 pg (ref 26.0–34.0)
MCH: 30.7 pg (ref 26.0–34.0)
MCHC: 33.1 g/dL (ref 30.0–36.0)
MCHC: 32.3 g/dL (ref 30.0–36.0)
MCV: 94.4 fL (ref 80.0–100.0)
MCV: 95.1 fL (ref 80.0–100.0)
Platelets: 102 10*3/uL — ABNORMAL LOW (ref 150–400)
Platelets: 97 10*3/uL — ABNORMAL LOW (ref 150–400)
RBC: 2.69 MIL/uL — ABNORMAL LOW (ref 3.87–5.11)
RBC: 2.64 MIL/uL — ABNORMAL LOW (ref 3.87–5.11)
RDW: 15.4 % (ref 11.5–15.5)
RDW: 15.2 % (ref 11.5–15.5)
WBC: 8.4 10*3/uL (ref 4.0–10.5)
WBC: 9.4 10*3/uL (ref 4.0–10.5)
nRBC: 0 % (ref 0.0–0.2)
nRBC: 0 % (ref 0.0–0.2)

## 2018-11-22 LAB — MAGNESIUM
Magnesium: 2.5 mg/dL — ABNORMAL HIGH (ref 1.7–2.4)
Magnesium: 2.3 mg/dL (ref 1.7–2.4)

## 2018-11-22 MED ORDER — POTASSIUM CHLORIDE CRYS ER 20 MEQ PO TBCR
40.0000 meq | EXTENDED_RELEASE_TABLET | Freq: Once | ORAL | Status: AC
  Administered 2018-11-22: 18:00:00 40 meq via ORAL
  Filled 2018-11-22: qty 2

## 2018-11-22 MED ORDER — POTASSIUM CHLORIDE 10 MEQ/50ML IV SOLN
10.0000 meq | INTRAVENOUS | Status: AC
  Administered 2018-11-22 (×3): 10 meq via INTRAVENOUS
  Filled 2018-11-22 (×2): qty 50

## 2018-11-22 MED ORDER — INSULIN ASPART 100 UNIT/ML ~~LOC~~ SOLN
0.0000 [IU] | SUBCUTANEOUS | Status: DC
  Administered 2018-11-22: 2 [IU] via SUBCUTANEOUS
  Administered 2018-11-22: 4 [IU] via SUBCUTANEOUS
  Administered 2018-11-23: 05:00:00 2 [IU] via SUBCUTANEOUS

## 2018-11-22 MED ORDER — TRAMADOL HCL 50 MG PO TABS
50.0000 mg | ORAL_TABLET | ORAL | Status: DC | PRN
  Administered 2018-11-22 – 2018-11-23 (×2): 50 mg via ORAL
  Filled 2018-11-22 (×2): qty 1

## 2018-11-22 MED ORDER — FUROSEMIDE 10 MG/ML IJ SOLN
40.0000 mg | Freq: Once | INTRAMUSCULAR | Status: AC
  Administered 2018-11-22: 40 mg via INTRAVENOUS
  Filled 2018-11-22: qty 4

## 2018-11-22 MED ORDER — FUROSEMIDE 10 MG/ML IJ SOLN
40.0000 mg | Freq: Once | INTRAMUSCULAR | Status: AC
  Administered 2018-11-22: 18:00:00 40 mg via INTRAVENOUS
  Filled 2018-11-22: qty 4

## 2018-11-22 MED ORDER — FE FUMARATE-B12-VIT C-FA-IFC PO CAPS
1.0000 | ORAL_CAPSULE | Freq: Two times a day (BID) | ORAL | Status: DC
  Administered 2018-11-22 – 2018-11-27 (×11): 1 via ORAL
  Filled 2018-11-22 (×11): qty 1

## 2018-11-22 MED ORDER — LIDOCAINE HCL (PF) 1 % IJ SOLN
INTRAMUSCULAR | Status: AC
  Filled 2018-11-22: qty 5

## 2018-11-22 MED ORDER — POTASSIUM CHLORIDE 10 MEQ/50ML IV SOLN
INTRAVENOUS | Status: AC
  Filled 2018-11-22: qty 50

## 2018-11-22 MED ORDER — ENOXAPARIN SODIUM 40 MG/0.4ML ~~LOC~~ SOLN
40.0000 mg | Freq: Every day | SUBCUTANEOUS | Status: DC
  Administered 2018-11-22: 22:00:00 40 mg via SUBCUTANEOUS
  Filled 2018-11-22: qty 0.4

## 2018-11-22 MED ORDER — METOPROLOL TARTRATE 25 MG PO TABS
25.0000 mg | ORAL_TABLET | Freq: Once | ORAL | Status: AC
  Administered 2018-11-22: 25 mg via ORAL
  Filled 2018-11-22: qty 1

## 2018-11-22 MED ORDER — INSULIN DETEMIR 100 UNIT/ML ~~LOC~~ SOLN
15.0000 [IU] | Freq: Once | SUBCUTANEOUS | Status: AC
  Administered 2018-11-22: 15 [IU] via SUBCUTANEOUS
  Filled 2018-11-22: qty 0.15

## 2018-11-22 MED FILL — Heparin Sodium (Porcine) Inj 1000 Unit/ML: INTRAMUSCULAR | Qty: 10 | Status: AC

## 2018-11-22 MED FILL — Sodium Bicarbonate IV Soln 8.4%: INTRAVENOUS | Qty: 50 | Status: AC

## 2018-11-22 MED FILL — Mannitol IV Soln 20%: INTRAVENOUS | Qty: 500 | Status: AC

## 2018-11-22 MED FILL — Albumin, Human Inj 5%: INTRAVENOUS | Qty: 250 | Status: AC

## 2018-11-22 MED FILL — Sodium Chloride IV Soln 0.9%: INTRAVENOUS | Qty: 2000 | Status: AC

## 2018-11-22 MED FILL — Lidocaine HCl(Cardiac) IV PF Soln Pref Syr 100 MG/5ML (2%): INTRAVENOUS | Qty: 5 | Status: AC

## 2018-11-22 MED FILL — Thrombin (Recombinant) For Soln 20000 Unit: CUTANEOUS | Qty: 1 | Status: AC

## 2018-11-22 MED FILL — Electrolyte-R (PH 7.4) Solution: INTRAVENOUS | Qty: 3000 | Status: AC

## 2018-11-22 NOTE — Progress Notes (Signed)
Patient ID: Monique [name], female   DOB: 1940-12-10, 78 y.o.   MRN: [id number] TCTS Evening Rounds:  Hemodynamically stable with rise in BP today. Start Lopressor.  Diuresed some today. Will give her another dose of lasix tonight.  Chest tubes out. Up in chair.  BMET    Component Value Date/Time   NA 140 11/22/2018 1604   NA 140 05/27/2018 1132   K 3.7 11/22/2018 1604   CL 107 11/22/2018 1604   CO2 22 11/22/2018 1604   GLUCOSE 198 (H) 11/22/2018 1604   BUN 18 11/22/2018 1604   BUN 24 05/27/2018 1132   CREATININE 0.78 11/22/2018 1604   CALCIUM 8.2 (L) 11/22/2018 1604   GFRNONAA >60 11/22/2018 1604   GFRAA >60 11/22/2018 1604   CBC    Component Value Date/Time   WBC 9.4 11/22/2018 1604   RBC 2.64 (L) 11/22/2018 1604   HGB 8.1 (L) 11/22/2018 1604   HGB 8.5 (L) 06/08/2018 0938   HCT 25.1 (L) 11/22/2018 1604   HCT 27.9 (L) 06/08/2018 0938   PLT 97 (L) 11/22/2018 1604   PLT 235 06/08/2018 0938   MCV 95.1 11/22/2018 1604   MCV 71 (L) 06/08/2018 0938   MCH 30.7 11/22/2018 1604   MCHC 32.3 11/22/2018 1604   RDW 15.2 11/22/2018 1604   RDW 19.5 (H) 06/08/2018 0938   LYMPHSABS 2.1 08/20/2017 1218   MONOABS 0.5 05/04/2016 0714   EOSABS 0.2 08/20/2017 1218   BASOSABS 0.1 08/20/2017 1218

## 2018-11-22 NOTE — Progress Notes (Addendum)
1 Day Post-Op Procedure(s) (LRB): CORONARY ARTERY BYPASS GRAFTING (CABG) x Three , using left internal mammary artery and right leg greater saphenous vein harvested endoscopically (N/A) TRANSESOPHAGEAL ECHOCARDIOGRAM (TEE) (N/A) Subjective: Sore   Objective: Vital signs in last 24 hours: Temp:  [96.1 F (35.6 C)-99.3 F (37.4 C)] 98.8 F (37.1 C) (04/21 0700) Pulse Rate:  [73-109] 81 (04/21 0700) Cardiac Rhythm: Normal sinus rhythm (04/20 2000) Resp:  [9-27] 19 (04/21 0700) BP: (83-154)/(43-84) 109/84 (04/21 0700) SpO2:  [93 %-100 %] 93 % (04/21 0700) Arterial Line BP: (69-220)/(45-92) 125/45 (04/21 0700) FiO2 (%):  [40 %-50 %] 40 % (04/20 2216) Weight:  [87.5 kg] 87.5 kg (04/21 0630)  Hemodynamic parameters for last 24 hours: PAP: (18-45)/(8-24) 31/13 CO:  [1.6 L/min-4.5 L/min] 4.2 L/min CI:  [0.9 L/min/m2-2.4 L/min/m2] 2.2 L/min/m2  Intake/Output from previous day: 04/20 0701 - 04/21 0700 In: 5174.8 [P.O.:240; I.V.:3451.1; Blood:400; IV Piggyback:1083.7] Out: 5755 [Urine:4195; Blood:800; Chest Tube:760] Intake/Output this shift: No intake/output data recorded.  General appearance: alert and cooperative Neurologic: intact Heart: regular rate and rhythm, S1, S2 normal, no murmur, click, rub or gallop Lungs: clear to auscultation bilaterally Extremities: edema moderate Wound: dressings dry  Lab Results: Recent Labs    11/21/18 1915  11/21/18 2333 11/22/18 0358  WBC 8.1  --   --  8.4  HGB 9.4*   < > 9.9* 8.4*  HCT 29.2*   < > 29.0* 25.4*  PLT 90*  --   --  102*   < > = values in this interval not displayed.   BMET:  Recent Labs    11/21/18 1552  11/21/18 1915  11/21/18 2333 11/22/18 0358  NA 139   < >  --    < > 148* 144  K 5.5*   < >  --    < > 4.1 3.9  CL 113*  --   --   --   --  113*  CO2 21*  --   --   --   --  24  GLUCOSE 182*  --   --   --   --  131*  BUN 13  --   --   --   --  14  CREATININE 0.60  --  0.71  --   --  0.57  CALCIUM 7.3*  --   --    --   --  7.9*   < > = values in this interval not displayed.    PT/INR:  Recent Labs    11/21/18 1334  LABPROT 19.4*  INR 1.7*   ABG    Component Value Date/Time   PHART 7.328 (L) 11/21/2018 2333   HCO3 24.9 11/21/2018 2333   TCO2 26 11/21/2018 2333   ACIDBASEDEF 1.0 11/21/2018 2333   O2SAT 99.0 11/21/2018 2333   CBG (last 3)  Recent Labs    11/22/18 0507 11/22/18 0609 11/22/18 0700  GLUCAP 111* 146* 143*   CXR: bibasilar atelectasis  ECG: sinus, non-specific changes  Assessment/Plan: S/P Procedure(s) (LRB): CORONARY ARTERY BYPASS GRAFTING (CABG) x Three , using left internal mammary artery and right leg greater saphenous vein harvested endoscopically (N/A) TRANSESOPHAGEAL ECHOCARDIOGRAM (TEE) (N/A)  POD 1 Hemodynamically stable in sinus rhythm. Required some Levophed postop but off that now. Will hold off on beta blocker today and plan to start tomorrow if BP stable.  Volume excess: start diuresis.  DM: preop Hgb A1c 6.1: received a dose of solumedrol last pm. Will give a dose of Levemir  this am and start SSI.  DC chest tubes later after another dangle.  DC swan and arterial line.  IS, OOB.  Expected acute postop blood loss anemia. Follow and will start iron.     LOS: 5 days    Alleen BorneBryan K Volanda Mangine 11/22/2018

## 2018-11-23 ENCOUNTER — Inpatient Hospital Stay (HOSPITAL_COMMUNITY): Payer: Medicare Other

## 2018-11-23 LAB — BASIC METABOLIC PANEL
Anion gap: 8 (ref 5–15)
BUN: 17 mg/dL (ref 8–23)
CO2: 25 mmol/L (ref 22–32)
Calcium: 8.4 mg/dL — ABNORMAL LOW (ref 8.9–10.3)
Chloride: 107 mmol/L (ref 98–111)
Creatinine, Ser: 0.56 mg/dL (ref 0.44–1.00)
GFR calc Af Amer: >60 mL/min (ref >60)
GFR calc non Af Amer: >60 mL/min (ref >60)
Glucose, Bld: 151 mg/dL — ABNORMAL HIGH (ref 70–99)
Potassium: 4.1 mmol/L (ref 3.5–5.1)
Sodium: 140 mmol/L (ref 135–145)

## 2018-11-23 LAB — CBC
HCT: 23.2 % — ABNORMAL LOW (ref 36.0–46.0)
Hemoglobin: 7.5 g/dL — ABNORMAL LOW (ref 12.0–15.0)
MCH: 31.1 pg (ref 26.0–34.0)
MCHC: 32.3 g/dL (ref 30.0–36.0)
MCV: 96.3 fL (ref 80.0–100.0)
Platelets: 91 10*3/uL — ABNORMAL LOW (ref 150–400)
RBC: 2.41 MIL/uL — ABNORMAL LOW (ref 3.87–5.11)
RDW: 15.7 % — ABNORMAL HIGH (ref 11.5–15.5)
WBC: 9.1 10*3/uL (ref 4.0–10.5)
nRBC: 0 % (ref 0.0–0.2)

## 2018-11-23 LAB — GLUCOSE, CAPILLARY
Glucose-Capillary: 107 mg/dL — ABNORMAL HIGH (ref 70–99)
Glucose-Capillary: 122 mg/dL — ABNORMAL HIGH (ref 70–99)
Glucose-Capillary: 143 mg/dL — ABNORMAL HIGH (ref 70–99)

## 2018-11-23 MED ORDER — SODIUM CHLORIDE 0.9% FLUSH
3.0000 mL | Freq: Two times a day (BID) | INTRAVENOUS | Status: DC
  Administered 2018-11-23 – 2018-11-24 (×3): 3 mL via INTRAVENOUS

## 2018-11-23 MED ORDER — MOVING RIGHT ALONG BOOK
Freq: Once | Status: AC
  Administered 2018-11-23: 1

## 2018-11-23 MED ORDER — FUROSEMIDE 40 MG PO TABS
40.0000 mg | ORAL_TABLET | Freq: Every day | ORAL | Status: DC
  Administered 2018-11-23 – 2018-11-27 (×5): 40 mg via ORAL
  Filled 2018-11-23 (×5): qty 1

## 2018-11-23 MED ORDER — CARVEDILOL 3.125 MG PO TABS
3.1250 mg | ORAL_TABLET | Freq: Two times a day (BID) | ORAL | Status: DC
  Administered 2018-11-23 – 2018-11-24 (×3): 3.125 mg via ORAL
  Filled 2018-11-23 (×3): qty 1

## 2018-11-23 MED ORDER — SODIUM CHLORIDE 0.9 % IV SOLN: 250.0000 mL | INTRAVENOUS | Status: DC | PRN

## 2018-11-23 MED ORDER — METOPROLOL TARTRATE 12.5 MG HALF TABLET: 12.5000 mg | ORAL_TABLET | Freq: Two times a day (BID) | ORAL | Status: DC

## 2018-11-23 MED ORDER — SODIUM CHLORIDE 0.9% FLUSH: 3.0000 mL | INTRAVENOUS | Status: DC | PRN

## 2018-11-23 MED ORDER — LORATADINE 10 MG PO TABS
10.0000 mg | ORAL_TABLET | Freq: Every day | ORAL | Status: DC
  Administered 2018-11-23 – 2018-11-27 (×5): 10 mg via ORAL
  Filled 2018-11-23 (×5): qty 1

## 2018-11-23 MED ORDER — POTASSIUM CHLORIDE CRYS ER 20 MEQ PO TBCR
20.0000 meq | EXTENDED_RELEASE_TABLET | Freq: Every day | ORAL | Status: DC
  Administered 2018-11-23: 20 meq via ORAL
  Filled 2018-11-23: qty 1

## 2018-11-23 MED ORDER — ALBUTEROL SULFATE (2.5 MG/3ML) 0.083% IN NEBU
3.0000 mL | INHALATION_SOLUTION | RESPIRATORY_TRACT | Status: DC
  Administered 2018-11-23 – 2018-11-24 (×4): 3 mL via RESPIRATORY_TRACT
  Filled 2018-11-23 (×4): qty 3

## 2018-11-23 NOTE — Evaluation (Signed)
Occupational Therapy Evaluation Patient Details Name: Monique [name] MRN: [id number] DOB: 10/05/1940 Today's Date: 11/23/2018    History of Present Illness Pt is a 78 y.o. F with significant PMH of CAD with previous stents who is now s/p coronary artery bypass grafting x 3 on 4/20   Clinical Impression   PTA Pt independent in ADL and transfers. Today Pt is mod A for sit<>Stand transfers requires assist for LB ADL and education initiated for "stay in the tube" sternal precautions. Pt does have front closure bras, educated on rear peri care, and is able to maintain static standing at min guard. Next session to focus on transfers, and bed mobility (not evaluated this session as Pt was in recliner, and left on commode -RN aware) OT will continue to follow acutely and [facility] maximize safety and independence in ADL and transfers and also to focus on IADL as Pt typically lives alone.     Follow Up Recommendations  Home health OT;Supervision/Assistance - 24 hour(initially)    Equipment Recommendations  3 in 1 bedside commode    Recommendations for Other Services       Precautions / Restrictions Precautions Precautions: Fall;Sternal Precaution Booklet Issued: Yes (comment) Precaution Comments: verbally reviewed and provided written handout Restrictions Weight Bearing Restrictions: Yes Other Position/Activity Restrictions: sternal precautions      Mobility Bed Mobility               General bed mobility comments: OOB in chair  Transfers Overall transfer level: Needs assistance Equipment used: None Transfers: Sit to/from Stand Sit to Stand: Mod assist         General transfer comment: Pt able to stand with mod assist after multiple trials. VC's and visual demonstration of rocking forward to gain momentum and achieve anterior weight shift. Cues for hand positioning to maintain sternal precautions. Ultimately preferred to use pillow    Balance  Overall balance assessment: Needs assistance   Sitting balance-Leahy Scale: Good       Standing balance-Leahy Scale: Fair Standing balance comment: abld to static stand, BUE for dynamic                           ADL either performed or assessed with clinical judgement   ADL Overall ADL's : Needs assistance/impaired Eating/Feeding: Independent;Sitting   Grooming: Wash/dry face;Standing Grooming Details (indicate cue type and reason): educated on "move in the tube" for grooming tasks. keeping elbows in Upper Body Bathing: Minimal assistance;Sitting   Lower Body Bathing: Moderate assistance;Sitting/lateral leans   Upper Body Dressing : Moderate assistance;Sitting   Lower Body Dressing: Sit to/from stand;Maximal assistance   Toilet Transfer: Moderate assistance;Ambulation;RW Toilet Transfer Details (indicate cue type and reason): mod A for boost, min guard afterwards Toileting- Clothing Manipulation and Hygiene: Moderate assistance;Sit to/from stand       Functional mobility during ADLs: Min guard;Rolling walker(mod A for boost initially) General ADL Comments: needs reinforcement of sternal precautions for application to ADL     Vision         Perception     Praxis      Pertinent Vitals/Pain Pain Assessment: Faces Faces Pain Scale: Hurts little more Pain Location: right shoulder Pain Descriptors / Indicators: Burning Pain Intervention(s): Monitored during session     Hand Dominance Right   Extremity/Trunk Assessment Upper Extremity Assessment Upper Extremity Assessment: Generalized weakness(generalized edema post-op)   Lower Extremity Assessment Lower Extremity Assessment: Defer  to PT evaluation   Cervical / Trunk Assessment Cervical / Trunk Assessment: Other exceptions Cervical / Trunk Exceptions: sternal precautions   Communication Communication Communication: No difficulties   Cognition Arousal/Alertness: Awake/alert Behavior During  Therapy: WFL for tasks assessed/performed Overall Cognitive Status: Within Functional Limits for tasks assessed                                     General Comments       Exercises     Shoulder Instructions      Home Living Family/patient expects to be discharged to:: Private residence Living Arrangements: Alone Available Help at Discharge: Friend(s);Available 24 hours/day(going to stay for the first 5 days) Type of Home: House Home Access: Ramped entrance     Home Layout: One level     Bathroom Shower/Tub: Producer, television/film/video: Handicapped height Bathroom Accessibility: Yes   Home Equipment: Environmental consultant - 4 wheels;Cane - single point;Shower seat          Prior Functioning/Environment Level of Independence: Independent                 OT Problem List: Decreased strength;Decreased range of motion;Decreased activity tolerance;Impaired balance (sitting and/or standing);Decreased safety awareness;Decreased knowledge of use of DME or AE;Decreased knowledge of precautions;Cardiopulmonary status limiting activity;Pain      OT Treatment/Interventions: Self-care/ADL training;DME and/or AE instruction;Energy conservation;Therapeutic activities;Patient/family education;Balance training    OT Goals(Current goals can be found in the care plan section) Acute Rehab OT Goals Patient Stated Goal: "get more energy." OT Goal Formulation: With patient Time For Goal Achievement: 12/07/18 Potential to Achieve Goals: Good ADL Goals Pt Will Perform Grooming: with modified independence;standing Pt Will Perform Upper Body Dressing: with modified independence;sitting Pt Will Perform Lower Body Dressing: with supervision;sit to/from stand Pt Will Transfer to Toilet: with modified independence;ambulating Pt Will Perform Toileting - Clothing Manipulation and hygiene: with modified independence;sit to/from stand Additional ADL Goal #1: Pt will perform bed mobility at  mod I level prior to engaging in ADL and while maintaining sternal precautions  OT Frequency: Min 3X/week   Barriers to D/C:            Co-evaluation              AM-PAC OT "6 Clicks" Daily Activity     Outcome Measure Help from another person eating meals?: None Help from another person taking care of personal grooming?: A Little Help from another person toileting, which includes using toliet, bedpan, or urinal?: A Lot Help from another person bathing (including washing, rinsing, drying)?: A Lot Help from another person to put on and taking off regular upper body clothing?: A Lot Help from another person to put on and taking off regular lower body clothing?: A Lot 6 Click Score: 15   End of Session Equipment Utilized During Treatment: Gait belt;Rolling walker Nurse Communication: Mobility status;Other (comment)(in bathroom on toilet)  Activity Tolerance: Patient tolerated treatment well Patient left: Other (comment)(in bathroom on toilet)  OT Visit Diagnosis: Unsteadiness on feet (R26.81);Other abnormalities of gait and mobility (R26.89);Muscle weakness (generalized) (M62.81);Pain Pain - Right/Left: Right Pain - part of body: Shoulder(and sternal incision)                Time: 4098-1191 OT Time Calculation (min): 25 min Charges:  OT General Charges $OT Visit: 1 Visit OT Evaluation $OT Eval Moderate Complexity: 1 Mod OT Treatments $Self Care/Home  Management : 8-22 mins  Sherryl Manges OTR/L Acute Rehabilitation Services Pager: [phone] Office: [phone]  Evern Bio Vicktoria Muckey 11/23/2018, 6:15 PM

## 2018-11-23 NOTE — Progress Notes (Signed)
Discussed POC with Ambulance person. Assessed patient for PIV placement. Patient has poor vasculature. No further IV infusion ordered at this time after current ATB is completed infusion through [facility]. Instructed nurse to notify [facility] if new IV infusions are order. VU. Tomasita Morrow, RN [facility]

## 2018-11-23 NOTE — TOC Initial Note (Signed)
Transition of Care [facility]) - Initial/Assessment Note    Patient Details  Name: Monique [name] MRN: [id number] Date of Birth: [birth year]  Transition of Care [facility]) CM/SW Contact:    Colleen Can RN, BSN, NCM-BC, ACM-RN [phone] (working remotely) Phone Number: 11/23/2018, 11:55 AM  Clinical Narrative:                 78 yo female presented for a scheduled CABG 11/21/18. Patient lived at home alone, with all the necessary DME and was independent with her ADLs PTA. CM consult acknowledged for [facility] services with the CMS [facility] Medicare Compare list provided; [facility] (patient indicated using [facility] in the past). Patient has arranged for a friend to stay with her to assist with her needs post-transition. HH referral given to Lupita Leash RN, [facility] liaison; AVS updated. CM team will continue to follow for progression of care needs.   Expected Discharge Plan: Home w Home Health Services Barriers to Discharge: Continued Medical Work up   Patient Goals and CMS Choice Patient states their goals for this hospitalization and ongoing recovery are:: "Have more energy" CMS Medicare.gov Compare Post Acute Care list provided to:: Patient Choice offered to / list presented to : Patient  Expected Discharge Plan and Services Expected Discharge Plan: Home w Home Health Services   Discharge Planning Services: CM Consult Post Acute Care Choice: Home Health Living arrangements for the past 2 months: Single Family Home                 DME Arranged: N/A DME Agency: NA HH Arranged: PT, OT HH Agency: [facility]  Prior Living Arrangements/Services Living arrangements for the past 2 months: Single Family Home Lives with:: Self Patient language and need for interpreter reviewed:: Yes Do you feel safe going back to the place where you live?: Yes      Need for Family Participation in Patient Care: Yes (Comment)(Friend will assist post transition) Care giver support system in place?:  Yes (comment) Current home services: DME Criminal Activity/Legal Involvement Pertinent to Current Situation/Hospitalization: No - Comment as needed  Activities of Daily Living Home Assistive Devices/Equipment: Environmental consultant (specify type), Cane (specify quad or straight), Blood pressure cuff ADL Screening (condition at time of admission) Patient's cognitive ability adequate to safely complete daily activities?: Yes Is the patient deaf or have difficulty hearing?: No Does the patient have difficulty seeing, even when wearing glasses/contacts?: No Does the patient have difficulty concentrating, remembering, or making decisions?: No Patient able to express need for assistance with ADLs?: Yes Does the patient have difficulty dressing or bathing?: No Independently performs ADLs?: Yes (appropriate for developmental age) Does the patient have difficulty walking or climbing stairs?: Yes Weakness of Legs: None Weakness of Arms/Hands: None  Permission Sought/Granted Permission sought to share information with : Case Manager, Oceanographer granted to share information with : Yes, Verbal Permission Granted     Permission granted to share info w AGENCY: [facility]        Emotional Assessment Appearance:: Appears stated age Attitude/Demeanor/Rapport: Engaged Affect (typically observed): Appropriate Orientation: : Oriented to Self, Oriented to Place, Oriented to  Time, Oriented to Situation Alcohol / Substance Use: Not Applicable Psych Involvement: No (comment)  Admission diagnosis:  angina Patient Active Problem List   Diagnosis Date Noted  . S/P CABG x 3 11/21/2018  . CAD in native artery 11/17/2018  . Anemia 06/08/2018  . Redness and swelling of upper arm 06/08/2018  . CAD  S/P percutaneous coronary angioplasty 05/30/2018  . Chest pain 05/27/2018  . Sinus tachycardia 08/20/2017  . Dyslipidemia, goal LDL below 70 08/20/2017  . 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 Essential hypertension   . Primary osteoarthritis of right hip 07/08/2011   PCP:  Elizabeth PalauAnderson, Teresa, FNP Pharmacy:   [facility] [phone]#[id number] - [place], [place] - [address] [place] [address] Phone: [phone] Fax: [phone]  [id number] [address] [facility] [place] - [address] [place] [address] Phone: [phone] Fax: [phone]  [facility] [place] [address] [place] [address] Phone: [phone] Fax: [phone]

## 2018-11-23 NOTE — Evaluation (Signed)
Physical Therapy Evaluation Patient Details Name: Monique [name] MRN: [id number] DOB: August 14, 1940 Today's Date: 11/23/2018   History of Present Illness  Pt is a 78 y.o. F with significant PMH of CAD with previous stents who is now s/p coronary artery bypass grafting x 3 on 4/20  Clinical Impression  Pt admitted with above diagnosis. Pt currently with functional limitations due to the deficits listed below (see PT Problem List). Pt presenting with decreased mobility secondary to weakness and decreased cardiovascular endurance. Requiring up to moderate assistance for transfers, but ambulating x 100 feet with walker without supervision. HR 103-121 bpm, 98-100% SpO2 on RA. Will need continued transfer training prior to discharge and reinforcement for sternal precautions. Pt will benefit from skilled PT to increase their independence and safety with mobility to allow discharge to the venue listed below.       Follow Up Recommendations Home health PT;Supervision for mobility/OOB    Equipment Recommendations  None recommended by PT    Recommendations for Other Services OT consult     Precautions / Restrictions Precautions Precautions: Fall;Sternal Precaution Booklet Issued: Yes (comment) Precaution Comments: verbally reviewed and provided written handout Restrictions Weight Bearing Restrictions: Yes Other Position/Activity Restrictions: sternal precautions      Mobility  Bed Mobility               General bed mobility comments: OOB in chair  Transfers Overall transfer level: Needs assistance Equipment used: None Transfers: Sit to/from Stand Sit to Stand: Mod assist         General transfer comment: Pt able to stand with mod assist after multiple trials. VC's and visual demonstration of rocking forward to gain momentum and achieve anterior weight shift. Cues for hand positioning to maintain sternal precautions  Ambulation/Gait Ambulation/Gait assistance:  Supervision Gait Distance (Feet): 100 Feet Assistive device: Rolling walker (2 wheeled) Gait Pattern/deviations: Step-through pattern;Decreased stance time - left Gait velocity: decr Gait velocity interpretation: <1.8 ft/sec, indicate of risk for recurrent falls General Gait Details: Slow and steady gait  Stairs            Wheelchair Mobility    Modified Rankin (Stroke Patients Only)       Balance Overall balance assessment: Needs assistance   Sitting balance-Leahy Scale: Good       Standing balance-Leahy Scale: Fair                               Pertinent Vitals/Pain Pain Assessment: Faces Faces Pain Scale: Hurts little more Pain Location: right shoulder Pain Descriptors / Indicators: Burning Pain Intervention(s): Monitored during session    Home Living Family/patient expects to be discharged to:: Private residence Living Arrangements: Alone Available Help at Discharge: Friend(s) Type of Home: House Home Access: Ramped entrance     Home Layout: One level Home Equipment: Environmental consultant - 4 wheels;Cane - single point;Shower seat      Prior Function Level of Independence: Independent               Hand Dominance        Extremity/Trunk Assessment   Upper Extremity Assessment Upper Extremity Assessment: Overall WFL for tasks assessed    Lower Extremity Assessment Lower Extremity Assessment: Generalized weakness       Communication   Communication: No difficulties  Cognition Arousal/Alertness: Awake/alert Behavior During Therapy: WFL for tasks assessed/performed Overall Cognitive Status: Within Functional Limits for tasks assessed  General Comments      Exercises     Assessment/Plan    PT Assessment Patient needs continued PT services  PT Problem List Decreased strength;Decreased activity tolerance;Decreased balance;Decreased mobility;Pain;Cardiopulmonary status limiting  activity;Decreased knowledge of precautions       PT Treatment Interventions DME instruction;Gait training;Functional mobility training;Therapeutic activities;Therapeutic exercise;Balance training;Patient/family education    PT Goals (Current goals can be found in the Care Plan section)  Acute Rehab PT Goals Patient Stated Goal: "get more energy." PT Goal Formulation: With patient Time For Goal Achievement: 12/07/18 Potential to Achieve Goals: Good    Frequency Min 3X/week   Barriers to discharge        Co-evaluation               AM-PAC PT "6 Clicks" Mobility  Outcome Measure Help needed turning from your back to your side while in a flat bed without using bedrails?: A Little Help needed moving from lying on your back to sitting on the side of a flat bed without using bedrails?: A Little Help needed moving to and from a bed to a chair (including a wheelchair)?: A Little Help needed standing up from a chair using your arms (e.g., wheelchair or bedside chair)?: A Lot Help needed to walk in hospital room?: None Help needed climbing 3-5 steps with a railing? : A Lot 6 Click Score: 17    End of Session Equipment Utilized During Treatment: Gait belt Activity Tolerance: Patient tolerated treatment well Patient left: in chair;with call bell/phone within reach Nurse Communication: Mobility status PT Visit Diagnosis: Muscle weakness (generalized) (M62.81);Difficulty in walking, not elsewhere classified (R26.2)    Time: 2956-21301210-1232 PT Time Calculation (min) (ACUTE ONLY): 22 min   Charges:   PT Evaluation $PT Eval Moderate Complexity: 1 Mod          Laurina Bustlearoline Vermon Grays, [place] CarolinaPT, DPT Acute Rehabilitation Services Pager [phone] Office [phone]  Vanetta MuldersCarloine H Abbigael Detlefsen 11/23/2018, 1:04 PM

## 2018-11-23 NOTE — Progress Notes (Addendum)
TCTS DAILY ICU PROGRESS NOTE                   [address]            [place] [address]          [phone]   2 Days Post-Op Procedure(s) (LRB): CORONARY ARTERY BYPASS GRAFTING (CABG) x Three , using left internal mammary artery and right leg greater saphenous vein harvested endoscopically (N/A) TRANSESOPHAGEAL ECHOCARDIOGRAM (TEE) (N/A)  Total Length of Stay:  LOS: 6 days   Subjective:  No new complaints. Pain sometimes, but pain medication does help.  +ambulation  No BM  Objective: Vital signs in last 24 hours: Temp:  [97.6 F (36.4 C)-98.9 F (37.2 C)] 98.9 F (37.2 C) (04/22 0745) Pulse Rate:  [49-94] 93 (04/22 0600) Cardiac Rhythm: Normal sinus rhythm (04/22 0400) Resp:  [16-26] 18 (04/22 0600) BP: (103-160)/(44-92) 125/50 (04/22 0600) SpO2:  [90 %-100 %] 92 % (04/22 0600) Arterial Line BP: (104-170)/(42-55) 170/52 (04/21 1100) Weight:  [86.1 kg] 86.1 kg (04/22 0500)  Filed Weights   11/21/18 0425 11/22/18 0630 11/23/18 0500  Weight: 82.9 kg 87.5 kg 86.1 kg    Weight change: -1.353 kg   Hemodynamic parameters for last 24 hours: PAP: (27-29)/(10) 29/10  Intake/Output from previous day: 04/21 0701 - 04/22 0700 In: 910.6 [I.V.:563.6; IV Piggyback:346.9] Out: 1860 [Urine:1820; Chest Tube:40]  Current Meds: Scheduled Meds: . acetaminophen  1,000 mg Oral Q6H   Or  . acetaminophen (TYLENOL) oral liquid 160 mg/5 mL  1,000 mg Per Tube Q6H  . aspirin EC  325 mg Oral Daily   Or  . aspirin  324 mg Per Tube Daily  . atorvastatin  80 mg Oral q1800  . bisacodyl  10 mg Oral Daily   Or  . bisacodyl  10 mg Rectal Daily  . chlorhexidine gluconate (MEDLINE KIT)  15 mL Mouth Rinse BID  . Chlorhexidine Gluconate Cloth  6 each Topical Daily  . docusate sodium  200 mg Oral Daily  . enoxaparin (LOVENOX) injection  40 mg Subcutaneous QHS  . ferrous [id number] C-folic acid  1 capsule Oral BID  . furosemide  40 mg Oral Daily  . insulin aspart   0-24 Units Subcutaneous Q4H  . ketotifen  1 drop Both Eyes BID  . pantoprazole  40 mg Oral Daily  . potassium chloride  20 mEq Oral Daily  . sodium chloride flush  3 mL Intravenous Q12H   Continuous Infusions: . sodium chloride Stopped (11/21/18 2144)  . sodium chloride    . sodium chloride 20 mL/hr at 11/21/18 1338  . cefUROXime (ZINACEF)  IV Stopped (11/22/18 2050)  . lactated ringers    . lactated ringers Stopped (11/22/18 0508)   PRN Meds:.sodium chloride, ondansetron (ZOFRAN) IV, oxyCODONE, sodium chloride flush, traMADol  General appearance: alert, cooperative and no distress Heart: regular rate and rhythm Lungs: clear to auscultation bilaterally Abdomen: soft, non-tender; bowel sounds normal; no masses,  no organomegaly Extremities: edema trace Wound: clean and dry  Lab Results: CBC: Recent Labs    11/22/18 1604 11/23/18 0533  WBC 9.4 9.1  HGB 8.1* 7.5*  HCT 25.1* 23.2*  PLT 97* 91*   BMET:  Recent Labs    11/22/18 1604 11/23/18 0533  NA 140 140  K 3.7 4.1  CL 107 107  CO2 22 25  GLUCOSE 198* 151*  BUN 18 17  CREATININE 0.78 0.56  CALCIUM 8.2* 8.4*    CMET: Lab  Results  Component Value Date   WBC 9.1 11/23/2018   HGB 7.5 (L) 11/23/2018   HCT 23.2 (L) 11/23/2018   PLT 91 (L) 11/23/2018   GLUCOSE 151 (H) 11/23/2018   CHOL 135 06/01/2018   TRIG 150 (H) 06/01/2018   HDL 49 06/01/2018   LDLCALC 56 06/01/2018   ALT 27 11/20/2018   AST 21 11/20/2018   NA 140 11/23/2018   K 4.1 11/23/2018   CL 107 11/23/2018   CREATININE 0.56 11/23/2018   BUN 17 11/23/2018   CO2 25 11/23/2018   INR 1.7 (H) 11/21/2018   HGBA1C 6.1 (H) 11/18/2018      PT/INR:  Recent Labs    11/21/18 1334  LABPROT 19.4*  INR 1.7*   Radiology: No results found.   Assessment/Plan: S/P Procedure(s) (LRB): CORONARY ARTERY BYPASS GRAFTING (CABG) x Three , using left internal mammary artery and right leg greater saphenous vein harvested endoscopically (N/A) TRANSESOPHAGEAL  ECHOCARDIOGRAM (TEE) (N/A)  1. CV- NSR, BP improved with Lopressor yesterday- will restart home Coreg 2. Pulm- wean oxygen as tolerated, CXR looks good, no significant effusions, mild atelectasis present 3. Renal- creatinine remains WNL, K is WNL, good response to IV diuretics yesterday, will transition to oral lasix today, supplement K 4. Expected post operative blood loss anemia, Hgb down to 7.5 on iron supplementation, continue to monitor 5. Expected thrombocytopenia, remains stable 6. CBGs controlled, not a diabetic, will d/c SSIP 7. Dispo- patient stable, restart home Coreg, watch Hgb, transition to oral Lasix, transfer to 4E today     Ellwood Handler 11/23/2018 7:52 AM    Chart reviewed, patient examined, agree with above. She is making progress daily and walked around the ICU this am. Thrombocytopenia with plt ct down slightly from 97 to 91 this am. Will stop Lovenox for now. Transfer to 4E and continue mobilization and IS.

## 2018-11-24 ENCOUNTER — Inpatient Hospital Stay (HOSPITAL_COMMUNITY): Payer: Medicare Other

## 2018-11-24 DIAGNOSIS — Z951 Presence of aortocoronary bypass graft: Secondary | ICD-10-CM

## 2018-11-24 LAB — TYPE AND SCREEN
ABO/RH(D): A POS
Antibody Screen: NEGATIVE
PT AG Type: NEGATIVE
Unit division: 0
Unit division: 0

## 2018-11-24 LAB — CBC
HCT: 23.1 % — ABNORMAL LOW (ref 36.0–46.0)
Hemoglobin: 7.4 g/dL — ABNORMAL LOW (ref 12.0–15.0)
MCH: 31 pg (ref 26.0–34.0)
MCHC: 32 g/dL (ref 30.0–36.0)
MCV: 96.7 fL (ref 80.0–100.0)
Platelets: 110 10*3/uL — ABNORMAL LOW (ref 150–400)
RBC: 2.39 MIL/uL — ABNORMAL LOW (ref 3.87–5.11)
RDW: 15.5 % (ref 11.5–15.5)
WBC: 8.6 10*3/uL (ref 4.0–10.5)
nRBC: 0.2 % (ref 0.0–0.2)

## 2018-11-24 LAB — BASIC METABOLIC PANEL
Anion gap: 9 (ref 5–15)
BUN: 10 mg/dL (ref 8–23)
CO2: 25 mmol/L (ref 22–32)
Calcium: 8.4 mg/dL — ABNORMAL LOW (ref 8.9–10.3)
Chloride: 107 mmol/L (ref 98–111)
Creatinine, Ser: 0.52 mg/dL (ref 0.44–1.00)
GFR calc Af Amer: >60 mL/min (ref >60)
GFR calc non Af Amer: >60 mL/min (ref >60)
Glucose, Bld: 151 mg/dL — ABNORMAL HIGH (ref 70–99)
Potassium: 3.7 mmol/L (ref 3.5–5.1)
Sodium: 141 mmol/L (ref 135–145)

## 2018-11-24 LAB — BPAM RBC
Blood Product Expiration Date: 202004282359
Blood Product Expiration Date: 202004282359
ISSUE DATE / TIME: 202004201054
ISSUE DATE / TIME: 202004201054
Unit Type and Rh: 6200
Unit Type and Rh: 6200

## 2018-11-24 MED ORDER — POTASSIUM CHLORIDE CRYS ER 20 MEQ PO TBCR
20.0000 meq | EXTENDED_RELEASE_TABLET | Freq: Two times a day (BID) | ORAL | Status: DC
  Administered 2018-11-24 – 2018-11-27 (×7): 20 meq via ORAL
  Filled 2018-11-24 (×7): qty 1

## 2018-11-24 MED ORDER — ALBUTEROL SULFATE (2.5 MG/3ML) 0.083% IN NEBU
3.0000 mL | INHALATION_SOLUTION | Freq: Four times a day (QID) | RESPIRATORY_TRACT | Status: DC
  Administered 2018-11-24 – 2018-11-26 (×9): 3 mL via RESPIRATORY_TRACT
  Filled 2018-11-24 (×9): qty 3

## 2018-11-24 MED ORDER — CARVEDILOL 6.25 MG PO TABS
6.2500 mg | ORAL_TABLET | Freq: Two times a day (BID) | ORAL | Status: DC
  Administered 2018-11-24 – 2018-11-27 (×5): 6.25 mg via ORAL
  Filled 2018-11-24 (×5): qty 1

## 2018-11-24 NOTE — Discharge Summary (Signed)
Physician Discharge Summary       [address]       [place] [address]             [phone]       Patient ID: RIN GORTON MRN: [id number] DOB/AGE: [birth year] 79 y.o.  Admit date: 11/17/2018 Discharge date: 11/27/2018  Admission Diagnoses:  Patient Active Problem List   Diagnosis Date Noted  . CAD in native artery 11/17/2018  . Anemia 06/08/2018  . Redness and swelling of upper arm 06/08/2018  . CAD S/P percutaneous coronary angioplasty 05/30/2018  . Chest pain 05/27/2018  . Sinus tachycardia 08/20/2017  . Dyslipidemia, goal LDL below 70 08/20/2017  . Colostomy in place [facility]) 08/27/2016  . Essential hypertension   . Primary osteoarthritis of right hip 07/08/2011   Discharge Diagnoses:   Patient Active Problem List   Diagnosis Date Noted  . 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 CAD in native artery 11/17/2018  . Anemia 06/08/2018  . Redness and swelling of upper arm 06/08/2018  . CAD S/P percutaneous coronary angioplasty 05/30/2018  . Chest pain 05/27/2018  . Sinus tachycardia 08/20/2017  . Dyslipidemia, goal LDL below 70 08/20/2017  . Colostomy in place [facility]) 08/27/2016  . Essential hypertension   . Primary osteoarthritis of right hip 07/08/2011   Discharged Condition: good  History of Present Illness:  Ms. Varney is a 78 yo obese white female with known history of hypertension, hyperlipidemia, and CAD S/P PCI to proximal LAD in 2019.  In August of last year she developed exertional substernal chest discomfort in August of 2019.  She presented to her PCP who then referred her to Dr. Allyson Sabal.  She had catheterization in October at which time above stent was placed.  Her symptoms initially resolved after stent placement.  However approximately 6 weeks ago she developed recurrent exertional chest discomfort and shortness of breath.  The symptoms progressed to occurring daily and were also waking her up from sleep.  She had  repeat catheterization on 11/17/2018 which showed restenosis of the stent in the LAD as well as diseased in her ramus and obtuse marginal branch.  Her LV function remained preserved.  It was felt coronary bypass grafting would be indicated and TCTS consult was requested.      Hospital Course:   She remained chest pain free during hospitalization.  She was evaluated by Dr. Laneta Simmers who was in agreement coronary bypass grafting would be indicated.  She was taken to the operating room and underwent CABG x 3 utilizing LIMA to LAD, SVG to OM, and SVG to Ramus Intermediate.  She tolerated the procedure without difficulty and was taken to the SICU in stable condition.  She was extubated the evening of surgery.  During her stay in the SICU the patient was weaned off Levophed as tolerated.  Her chest tubes and arterial lines were removed without difficulty.  She was started on IV diuretics for volume overload to which she responded well.  She developed hypertension and elevated HR and her home regimen of Coreg was initiated.  She developed a mild drop in her platelet count and her Lovenox was discontinued.  She was maintaining NSR and was transferred to the telemetry unit on 11/23/2018.  The patient continued to progress. She remained in NSR and her pacing wires were removed without difficulty.  Her platelet count improved.  She is deconditioned and will require home health and PT and discharge.  These arrangements have  been made.  She is ambulating without significant difficulty.  Her incisions are healing without evidence of infection. We started her back on her plavix and reduced her ASA dose for her DES in the LAD placed by Dr. Allyson Sabal. She is medically stable for discharge home today.    Significant Diagnostic Studies: angiography:    Ost LAD to Prox LAD lesion is 95% stenosed.  Lat Ramus lesion is 90% stenosed.  Ramus lesion is 90% stenosed.  Treatments: surgery:   1. Median Sternotomy 2. Extracorporeal  circulation 3.   Coronary artery bypass grafting x 3   Left internal mammary graft to the LAD  SVG to Ramus  SVG to OM  4.   Endoscopic vein harvest from the right leg   Discharge Exam: Blood pressure (!) 129/54, pulse 91, temperature 97.9 F (36.6 C), temperature source Oral, resp. rate 17, height  (1.651 m), weight 87.4 kg, SpO2 98 %.   General appearance: alert, cooperative and no distress Neurologic: intact Heart: regular rate and rhythm Lungs: diminished breath sounds bibasilar Extremities: edema 1+ Wound: clean and dry   Disposition: Discharge disposition: 01-Home or Self Care        Discharge Medications:  The patient has been discharged on:   1.Beta Blocker:  Yes [ X  ]                              No   [   ]                              If No, reason:  2.Ace Inhibitor/ARB: Yes [ X  ]                                     No  [    ]                                     If No, reason:  3.Statin:   Yes [ X  ]                  No  [   ]                  If No, reason:  4.Ecasa:  Yes  [ X  ]                  No   [   ]                  If No, reason:     Discharge Instructions    Amb Referral to Cardiac Rehabilitation   Complete by:  As directed    Diagnosis:  CABG   CABG X ___:  3   Discharge patient   Complete by:  As directed    Discharge disposition:  01-Home or Self Care   Discharge patient date:  11/27/2018     Allergies as of 11/27/2018      Reactions   Penicillins Other (See Comments)   UNSPECIFIED REACTION  Unknown reaction as a child.  Tolerated Zosyn 2017.   Pregabalin Anxiety      Medication List    STOP taking these  medications   isosorbide mononitrate 30 MG 24 hr tablet Commonly known as:  IMDUR   nitroGLYCERIN 0.4 MG SL tablet Commonly known as:  Nitrostat   oxyCODONE-acetaminophen 10-325 MG tablet Commonly known as:  PERCOCET     TAKE these medications   aspirin EC 81 MG tablet Take 81 mg by mouth  daily.   atorvastatin 80 MG tablet Commonly known as:  LIPITOR Take 1 tablet (80 mg total) by mouth daily at 6 PM.   carvedilol 6.25 MG tablet Commonly known as:  COREG Take 1 tablet (6.25 mg total) by mouth 2 (two) times daily with a meal. What changed:    medication strength  See the new instructions.   cetirizine 10 MG tablet Commonly known as:  ZYRTEC Take 10 mg by mouth at bedtime.   clopidogrel 75 MG tablet Commonly known as:  PLAVIX Take 1 tablet (75 mg total) by mouth daily with breakfast.   ferrous sulfate 325 (65 FE) MG tablet Commonly known as:  FerrouSul Take 1 tablet (325 mg total) by mouth 2 (two) times daily with a meal.   furosemide 40 MG tablet Commonly known as:  LASIX Take 1 tablet (40 mg total) by mouth daily for 7 days. Start taking on:  November 28, 2018   lisinopril 40 MG tablet Commonly known as:  ZESTRIL Take 40 mg by mouth daily.   MAGNESIUM OXIDE PO Take 125 mg by mouth daily.   oxyCODONE 5 MG immediate release tablet Commonly known as:  Oxy IR/ROXICODONE Take 1 tablet (5 mg total) by mouth every 6 (six) hours as needed for severe pain.   pantoprazole 40 MG tablet Commonly known as:  PROTONIX Take 1 tablet (40 mg total) by mouth 2 (two) times daily before a meal.   polyethylene glycol 17 g packet Commonly known as:  MIRALAX / GLYCOLAX Take 17 g by mouth 2 (two) times daily. What changed:    when to take this  reasons to take this   Potassium Chloride ER 20 MEQ Tbcr Take 20 mEq by mouth daily for 7 days.   venlafaxine XR 75 MG 24 hr capsule Commonly known as:  EFFEXOR-XR Take 225 mg by mouth at bedtime.   Ventolin HFA 108 (90 Base) MCG/ACT inhaler Generic drug:  albuterol Inhale 2 puffs into the lungs every 4 (four) hours as needed for wheezing or shortness of breath.   VITAMIN D PO Take 1,000 Units by mouth daily.      Follow-up Information    [facility] Follow up.   Why:  Home Health Physical and Occupational  Therapy Contact information: [address] [place], [place] [address]  [phone]        Alleen Borne, MD Follow up on 12/28/2018.   Specialty:  Cardiothoracic Surgery Why:  Appointment is at 10:30, please get CXR at 10:00 at [facility] imaging located on first floor of our office building Contact information: [address] [place] [address] [phone]        Abelino Derrick, PA-C Follow up on 12/08/2018.   Specialties:  Cardiology, Radiology Why:  Virtual office visit at 2:00 pm, the office will call.  Contact information: [address] [place] [address] [phone]        [name], FNP. Call in 1 day(s).   Specialty:  Nurse Practitioner Contact information: [address] [place] [address] [phone]        Runell Gess,  MD .   Specialties:  Cardiology, Radiology Contact information: [address] [place] [address] [phone]           Signed: Sharlene Doryessa N Conte 11/27/2018, 10:36 AM

## 2018-11-24 NOTE — Progress Notes (Signed)
Cardiac Rehab Advisory Cardiac Rehab Phase I is not seeing pts face to face at this time due to Covid 19 restrictions. Ambulation is occurring through nursing, PT, and mobility teams. We will help facilitate that process as needed. We are calling pts in their rooms and discussing education. We will then deliver education materials to pts RN for delivery to pt.   Spoke with pt by phone. She says she has had a busy day and has not walked yet. Encouraged at least x2 walks today. Encouraged IS use hourly and getting to recliner. She sts her and her RN are making plans for after her bedrest is over. I set up d/c video for her to watch while in bed. Will call tomorrow for education. She is planning to do [facility] in [place] again eventually. [phone] Ethelda Chick CES, ACSM 3:06 PM 11/24/2018

## 2018-11-24 NOTE — Care Management Important Message (Signed)
Important Message  Patient Details  Name: Monique [name] MRN: [id number] Date of Birth: 10-29-1940   Medicare Important Message Given:  Yes    Akyra Bouchie Stefan Church 11/24/2018, 12:44 PM

## 2018-11-24 NOTE — Progress Notes (Signed)
Physical Therapy Treatment Patient Details Name: Monique [name] MRN: [id number] DOB: 02/21/1941 Today's Date: 11/24/2018    History of Present Illness Pt is a 78 y.o. F with significant PMH of CAD with previous stents who is now s/p coronary artery bypass grafting x 3 on 4/20    PT Comments    Pt ready to get OOB and ambulate.  Emphasis on getting to EOB with momentum and sit to stand by following sternal precautions, progressing gait with RW.   Follow Up Recommendations  Home health PT;Supervision for mobility/OOB     Equipment Recommendations  None recommended by PT    Recommendations for Other Services       Precautions / Restrictions Precautions Precautions: Fall;Sternal    Mobility  Bed Mobility Overal bed mobility: Needs Assistance Bed Mobility: Supine to Sit     Supine to sit: Min assist(HOB slightly raised)     General bed mobility comments: Worked to build momentum by "throwing legs off the bed" and simultaneously doing a sit up.  Transfers Overall transfer level: Needs assistance Equipment used: None Transfers: Sit to/from Stand Sit to Stand: Min assist         General transfer comment: discussed sternal precautions and what it meant for standing.  demo'd and practiced standing without UE assist  Ambulation/Gait Ambulation/Gait assistance: Min guard Gait Distance (Feet): 150 Feet Assistive device: Rolling walker (2 wheeled) Gait Pattern/deviations: Step-through pattern Gait velocity: decr Gait velocity interpretation: <1.8 ft/sec, indicate of risk for recurrent falls General Gait Details: slower, but able to increase speed noticeably to cueing.  Steady with the RW.   Stairs             Wheelchair Mobility    Modified Rankin (Stroke Patients Only)       Balance     Sitting balance-Leahy Scale: Good       Standing balance-Leahy Scale: Fair                              Cognition Arousal/Alertness:  Awake/alert Behavior During Therapy: WFL for tasks assessed/performed Overall Cognitive Status: Within Functional Limits for tasks assessed                                        Exercises      General Comments        Pertinent Vitals/Pain Pain Assessment: Faces Faces Pain Scale: Hurts a little bit Pain Location: general chest Pain Descriptors / Indicators: Sore Pain Intervention(s): Monitored during session    Home Living                      Prior Function            PT Goals (current goals can now be found in the care plan section) Acute Rehab PT Goals Patient Stated Goal: "get more energy." PT Goal Formulation: With patient Time For Goal Achievement: 12/07/18 Potential to Achieve Goals: Good Progress towards PT goals: Progressing toward goals    Frequency    Min 3X/week      PT Plan Current plan remains appropriate    Co-evaluation              AM-PAC PT "6 Clicks" Mobility   Outcome Measure  Help needed turning from your back to your side while in a flat bed  without using bedrails?: A Little Help needed moving from lying on your back to sitting on the side of a flat bed without using bedrails?: A Little Help needed moving to and from a bed to a chair (including a wheelchair)?: A Little Help needed standing up from a chair using your arms (e.g., wheelchair or bedside chair)?: A Little Help needed to walk in hospital room?: None Help needed climbing 3-5 steps with a railing? : A Little 6 Click Score: 19    End of Session   Activity Tolerance: Patient tolerated treatment well Patient left: Other (comment)(left with OT, going to the bathroom) Nurse Communication: Mobility status PT Visit Diagnosis: Other abnormalities of gait and mobility (R26.89);Difficulty in walking, not elsewhere classified (R26.2)     Time: 6160-7371 PT Time Calculation (min) (ACUTE ONLY): 15 min  Charges:  $Gait Training: 8-22 mins                      11/24/2018  Rushmore Bing, PT Acute Rehabilitation Services [phone]  (pager) [phone]  (office)   Monique [name] 11/24/2018, 4:46 PM

## 2018-11-24 NOTE — Discharge Instructions (Signed)

## 2018-11-24 NOTE — Progress Notes (Signed)
Removed pt EPW per order. Ends intact pt tolerated well. Pt instructed to stay on bedrest x1 hour. VSS stable. Will continue to monitor.  Theophilus Kinds, RN

## 2018-11-24 NOTE — Progress Notes (Signed)
Occupational Therapy Treatment Patient Details Name: Monique [name] T Bryk MRN: [id number] DOB: 11/13/1940 Today's Date: 11/24/2018    History of present illness Pt is a 78 y.o. F with significant PMH of CAD with previous stents who is now s/p coronary artery bypass grafting x 3 on 4/20   OT comments  Pt is progressing towards OT goals this session. Able to perform transfers from higher surfaces at min guard level today as well as toileting/peri care at min guard. Pt SOB throughout session, when O2 was checked (not good waveform) Pt was at 82%, when Pt was transferred to chair, new pulse-ox put on, Pt at 97% on RA. Pt also with nose bleed. Pt in chair and RN attending to her at EOS. OT will continue to follow acutely   Follow Up Recommendations  Home health OT;Supervision/Assistance - 24 hour    Equipment Recommendations  3 in 1 bedside commode    Recommendations for Other Services      Precautions / Restrictions Precautions Precautions: Fall;Sternal Precaution Comments: verbally reviewed Restrictions Weight Bearing Restrictions: Yes(Sternal) Other Position/Activity Restrictions: sternal precautions       Mobility Bed Mobility Overal bed mobility: Needs Assistance Bed Mobility: Supine to Sit     Supine to sit: Min assist(HOB slightly raised)     General bed mobility comments: Worked to build momentum by "throwing legs off the bed" and simultaneously doing a sit up.  Transfers Overall transfer level: Needs assistance Equipment used: None Transfers: Sit to/from Stand Sit to Stand: Min guard;From elevated surface         [facility]    Balance     Sitting balance-Leahy Scale: Good       Standing balance-Leahy Scale: Fair                             ADL either performed or assessed with clinical judgement   ADL Overall ADL's : Needs assistance/impaired     Grooming: Wash/dry hands;Wash/dry face;Set up;Sitting Grooming  Details (indicate cue type and reason): educated on "move in the tube" for grooming tasks. keeping elbows in                 Toilet Transfer: Min guard;BSC;RW(over toilet)   Toileting- ArchitectClothing Manipulation and Hygiene: Min guard;Sit to/from stand       Functional mobility during ADLs: Min guard;Rolling walker General ADL Comments: needs reinforcement of sternal precautions for application to ADL     Vision       Perception     Praxis      Cognition Arousal/Alertness: Awake/alert Behavior During Therapy: WFL for tasks assessed/performed Overall Cognitive Status: Within Functional Limits for tasks assessed                                          Exercises     Shoulder Instructions       General Comments      Pertinent Vitals/ Pain       Pain Assessment: Faces Faces Pain Scale: Hurts a little bit Pain Location: general chest Pain Descriptors / Indicators: Sore Pain Intervention(s): Monitored during session;Limited activity within patient's tolerance;Repositioned  Home Living  Prior Functioning/Environment              Frequency  Min 3X/week        Progress Toward Goals  OT Goals(current goals can now be found in the care plan section)  Progress towards OT goals: Progressing toward goals  Acute Rehab OT Goals Patient Stated Goal: "get more energy." OT Goal Formulation: With patient Time For Goal Achievement: 12/07/18 Potential to Achieve Goals: Good  Plan Discharge plan remains appropriate;Frequency remains appropriate    Co-evaluation                 AM-PAC OT "6 Clicks" Daily Activity     Outcome Measure   Help from another person eating meals?: None Help from another person taking care of personal grooming?: A Little Help from another person toileting, which includes using toliet, bedpan, or urinal?: A Little Help from another person bathing  (including washing, rinsing, drying)?: A Lot Help from another person to put on and taking off regular upper body clothing?: A Little Help from another person to put on and taking off regular lower body clothing?: A Lot 6 Click Score: 17    End of Session Equipment Utilized During Treatment: Gait belt;Rolling walker  OT Visit Diagnosis: Unsteadiness on feet (R26.81);Other abnormalities of gait and mobility (R26.89);Muscle weakness (generalized) (M62.81);Pain Pain - part of body: (sternum)   Activity Tolerance Patient tolerated treatment well   Patient Left in chair;with call bell/phone within reach   Nurse Communication Mobility status;Other (comment)(nose bleed)        Time: 1601-0932 OT Time Calculation (min): 21 min  Charges: OT General Charges $OT Visit: 1 Visit OT Treatments $Self Care/Home Management : 8-22 mins  Sherryl Manges OTR/L Acute Rehabilitation Services Pager: [phone] Office: [phone]   Evern Bio Callin Ashe 11/24/2018, 6:01 PM

## 2018-11-24 NOTE — Progress Notes (Addendum)
[address]       [place], [address]             [phone]      3 Days Post-Op Procedure(s) (LRB): CORONARY ARTERY BYPASS GRAFTING (CABG) x Three , using left internal mammary artery and right leg greater saphenous vein harvested endoscopically (N/A) TRANSESOPHAGEAL ECHOCARDIOGRAM (TEE) (N/A) Subjective: Feels pretty well, some chest burning(incisional) but overall feels well  Objective: Vital signs in last 24 hours: Temp:  [97.5 F (36.4 C)-98.9 F (37.2 C)] 97.9 F (36.6 C) (04/22 2320) Pulse Rate:  [60-98] 60 (04/22 2320) Cardiac Rhythm: Normal sinus rhythm (04/22 1943) Resp:  [18-23] 20 (04/22 2320) BP: (120-145)/(49-63) 125/54 (04/22 2320) SpO2:  [96 %-100 %] 96 % (04/23 0316) Weight:  [86.2 kg] 86.2 kg (04/23 0500)  Hemodynamic parameters for last 24 hours:    Intake/Output from previous day: 04/22 0701 - 04/23 0700 In: 150 [P.O.:150] Out: 175 [Urine:175] Intake/Output this shift: No intake/output data recorded.  General appearance: alert, cooperative and no distress Heart: regular rate and rhythm Lungs: mildly dim in bases Abdomen: benign Extremities: minor edema Wound: chest incis in place,  evh sites healing well  Lab Results: Recent Labs    11/23/18 0533 11/24/18 0248  WBC 9.1 8.6  HGB 7.5* 7.4*  HCT 23.2* 23.1*  PLT 91* 110*   BMET:  Recent Labs    11/23/18 0533 11/24/18 0248  NA 140 141  K 4.1 3.7  CL 107 107  CO2 25 25  GLUCOSE 151* 151*  BUN 17 10  CREATININE 0.56 0.52  CALCIUM 8.4* 8.4*    PT/INR:  Recent Labs    11/21/18 1334  LABPROT 19.4*  INR 1.7*   ABG    Component Value Date/Time   PHART 7.328 (L) 11/21/2018 2333   HCO3 24.9 11/21/2018 2333   TCO2 26 11/21/2018 2333   ACIDBASEDEF 1.0 11/21/2018 2333   O2SAT 99.0 11/21/2018 2333   CBG (last 3)  Recent Labs    11/22/18 2345 11/23/18 0444 11/23/18 0749  GLUCAP 107* 122* 143*    Meds Scheduled Meds:  acetaminophen  1,000 mg Oral  Q6H   Or   acetaminophen (TYLENOL) oral liquid 160 mg/5 mL  1,000 mg Per Tube Q6H   albuterol  3 mL Inhalation Q6H   aspirin EC  325 mg Oral Daily   Or   aspirin  324 mg Per Tube Daily   atorvastatin  80 mg Oral q1800   bisacodyl  10 mg Oral Daily   Or   bisacodyl  10 mg Rectal Daily   carvedilol  3.125 mg Oral BID WC   chlorhexidine gluconate (MEDLINE KIT)  15 mL Mouth Rinse BID   Chlorhexidine Gluconate Cloth  6 each Topical Daily   docusate sodium  200 mg Oral Daily   ferrous [id number] C-folic acid  1 capsule Oral BID   furosemide  40 mg Oral Daily   ketotifen  1 drop Both Eyes BID   loratadine  10 mg Oral Daily   pantoprazole  40 mg Oral Daily   potassium chloride  20 mEq Oral Daily   sodium chloride flush  3 mL Intravenous Q12H   sodium chloride flush  3 mL Intravenous Q12H   Continuous Infusions:  sodium chloride 10 mL/hr (11/23/18 0824)   sodium chloride     sodium chloride 20 mL/hr at 11/21/18 1338   sodium chloride     lactated ringers  lactated ringers Stopped (11/22/18 0508)   PRN Meds:.sodium chloride, sodium chloride, ondansetron (ZOFRAN) IV, oxyCODONE, sodium chloride flush, sodium chloride flush, traMADol  Xrays Dg Chest 2 View  Result Date: 11/24/2018 CLINICAL DATA:  78 year old female status post CABG postoperative day 3. EXAM: CHEST - 2 VIEW COMPARISON:  11/23/2018 and earlier. FINDINGS: PA and lateral views. Right IJ introducer sheath remains in place. Epicardial pacer wires remain in place. Small bilateral pleural effusions. Low lung volumes. No pneumothorax or pulmonary edema. Stable cardiac size and mediastinal contours. Visualized tracheal air column is within normal limits. Negative visible bowel gas pattern. Recent sternotomy. No acute osseous abnormality identified. IMPRESSION: 1. Low lung volumes with small bilateral pleural effusions. 2. Right IJ introducer sheath remains in place. Electronically Signed   By: Genevie Karima M.D.   On: 11/24/2018 06:53   Dg Chest Port 1 View  Result Date: 11/23/2018 CLINICAL DATA:  Postop day 2 CABG. EXAM: PORTABLE CHEST 1 VIEW COMPARISON:  11/22/2018 and earlier. FINDINGS: Sternotomy for CABG. Cardiac silhouette mildly enlarged for technique and degree of inspiration, unchanged. Pulmonary vascularity normal. Atelectasis involving the LEFT lung base, unchanged. Stable small LEFT pleural effusion. No new pulmonary parenchymal abnormalities. Interval Swan-Ganz catheter removal. RIGHT jugular introducer sheath tip projects over the UPPER SVC. Severe degenerative changes in both shoulders again noted. IMPRESSION: 1. Support apparatus satisfactory. 2. Stable atelectasis involving the LEFT lung base and stable small LEFT pleural effusion. 3. No new abnormalities. Electronically Signed   By: Evangeline Dakin M.D.   On: 11/23/2018 08:12    Assessment/Plan: S/P Procedure(s) (LRB): CORONARY ARTERY BYPASS GRAFTING (CABG) x Three , using left internal mammary artery and right leg greater saphenous vein harvested endoscopically (N/A) TRANSESOPHAGEAL ECHOCARDIOGRAM (TEE) (N/A)  1 conts to do well, hemodyn stable in sinus rhythm. BP elevated at times- monitor but may be able to hold low dose ACE-I/ARB if elevated consistently 2 sats good on RA 3 some volume overload, cont diuresis, renal fnxn ok 4 d/c wires, leave sleeve one more day since poor IV access 5 cont nebs, pulm toilet 6 cont rehab 7 H/H stable ABL anemia- on iron   LOS: 7 days    [name] 11/24/2018 Pager [phone]   Chart reviewed, patient examined, agree with above. She is making progress. She is puffy and has poor peripheral veins so will keep her sleeve in a while longer.

## 2018-11-24 NOTE — Progress Notes (Addendum)
Progress Note  Patient Name: Monique [name] Date of Encounter: 11/24/2018  Primary Cardiologist: Quay Burow, MD   Subjective   Still not much energy, but can tell she is getting better.  Inpatient Medications    Scheduled Meds:  acetaminophen  1,000 mg Oral Q6H   Or   acetaminophen (TYLENOL) oral liquid 160 mg/5 mL  1,000 mg Per Tube Q6H   albuterol  3 mL Inhalation Q6H   aspirin EC  325 mg Oral Daily   Or   aspirin  324 mg Per Tube Daily   atorvastatin  80 mg Oral q1800   bisacodyl  10 mg Oral Daily   Or   bisacodyl  10 mg Rectal Daily   carvedilol  3.125 mg Oral BID WC   chlorhexidine gluconate (MEDLINE KIT)  15 mL Mouth Rinse BID   Chlorhexidine Gluconate Cloth  6 each Topical Daily   docusate sodium  200 mg Oral Daily   ferrous [id number] C-folic acid  1 capsule Oral BID   furosemide  40 mg Oral Daily   ketotifen  1 drop Both Eyes BID   loratadine  10 mg Oral Daily   pantoprazole  40 mg Oral Daily   potassium chloride  20 mEq Oral BID   sodium chloride flush  3 mL Intravenous Q12H   sodium chloride flush  3 mL Intravenous Q12H   Continuous Infusions:  sodium chloride 10 mL/hr (11/23/18 0824)   sodium chloride     sodium chloride 20 mL/hr at 11/21/18 1338   sodium chloride     lactated ringers     lactated ringers Stopped (11/22/18 0508)   PRN Meds: sodium chloride, sodium chloride, ondansetron (ZOFRAN) IV, oxyCODONE, sodium chloride flush, sodium chloride flush, traMADol   Vital Signs    Vitals:   11/23/18 2320 11/24/18 0316 11/24/18 0500 11/24/18 0838  BP: (!) 125/54   (!) 176/54  Pulse: 60   (!) 102  Resp: 20     Temp: 97.9 F (36.6 C)     TempSrc: Oral     SpO2: 96% 96%    Weight:   86.2 kg   Height:   _0  (1.651 m)     Intake/Output Summary (Last 24 hours) at 11/24/2018 0844 Last data filed at 11/23/2018 1410 Gross per 24 hour  Intake 150 ml  Output 75 ml  Net 75 ml   Last 3 Weights 11/24/2018  11/23/2018 11/22/2018  Weight (lbs) 190 lb 189 lb 13.1 oz 192 lb 12.8 oz  Weight (kg) 86.183 kg 86.1 kg 87.454 kg      Telemetry    SR, no sig ectopy- Personally Reviewed  Physical Exam   General: Well developed, well nourished, female in no acute distress Head: Nl Lungs: decreased BS bases bilaterally. S/P sternotomy Heart: RRR Abdomen: NT/ND Extremities: Trace edema Skin:  No rashes or lesions noted. Neuro: Alert and oriented X 3. Psych:  Good affect, responds appropriately   Labs    Chemistry Recent Labs  Lab 11/20/18 2113  11/22/18 1604 11/23/18 0533 11/24/18 0248  NA 137   < > 140 140 141  K 3.8   < > 3.7 4.1 3.7  CL 102   < > 107 107 107  CO2 29   < > _1 GLUCOSE 135*   < > 198* 151* 151*  BUN 18   < > _2 CREATININE 0.65   < > 0.78 0.56 0.52  CALCIUM 8.8*   < >  8.2* 8.4* 8.4*  PROT 5.6*  --   --   --   --   ALBUMIN 3.5  --   --   --   --   AST 21  --   --   --   --   ALT 27  --   --   --   --   ALKPHOS 67  --   --   --   --   BILITOT 1.1  --   --   --   --   GFRNONAA >60   < > >60 >60 >60  GFRAA >60   < > >60 >60 >60  ANIONGAP 6   < > _0 < > = values in this interval not displayed.     Hematology Recent Labs  Lab 11/22/18 1604 11/23/18 0533 11/24/18 0248  WBC 9.4 9.1 8.6  RBC 2.64* 2.41* 2.39*  HGB 8.1* 7.5* 7.4*  HCT 25.1* 23.2* 23.1*  MCV 95.1 96.3 96.7  MCH 30.7 31.1 31.0  MCHC 32.3 32.3 32.0  RDW 15.2 15.7* 15.5  PLT 97* 91* 110*    Radiology    Dg Chest 2 View  Result Date: 11/24/2018 CLINICAL DATA:  78 year old female status post CABG postoperative day 3. EXAM: CHEST - 2 VIEW COMPARISON:  11/23/2018 and earlier. FINDINGS: PA and lateral views. Right IJ introducer sheath remains in place. Epicardial pacer wires remain in place. Small bilateral pleural effusions. Low lung volumes. No pneumothorax or pulmonary edema. Stable cardiac size and mediastinal contours. Visualized tracheal air column is within normal limits.  Negative visible bowel gas pattern. Recent sternotomy. No acute osseous abnormality identified. IMPRESSION: 1. Low lung volumes with small bilateral pleural effusions. 2. Right IJ introducer sheath remains in place. Electronically Signed   By: Genevie Kiasha M.D.   On: 11/24/2018 06:53   Dg Chest Port 1 View  Result Date: 11/23/2018 CLINICAL DATA:  Postop day 2 CABG. EXAM: PORTABLE CHEST 1 VIEW COMPARISON:  11/22/2018 and earlier. FINDINGS: Sternotomy for CABG. Cardiac silhouette mildly enlarged for technique and degree of inspiration, unchanged. Pulmonary vascularity normal. Atelectasis involving the LEFT lung base, unchanged. Stable small LEFT pleural effusion. No new pulmonary parenchymal abnormalities. Interval Swan-Ganz catheter removal. RIGHT jugular introducer sheath tip projects over the UPPER SVC. Severe degenerative changes in both shoulders again noted. IMPRESSION: 1. Support apparatus satisfactory. 2. Stable atelectasis involving the LEFT lung base and stable small LEFT pleural effusion. 3. No new abnormalities. Electronically Signed   By: Evangeline Dakin M.D.   On: 11/23/2018 08:12     Cardiac Studies   CABG x 3: 11/21/2018 Procedure: 1. Median Sternotomy 2. Extracorporeal circulation 3.   Coronary artery bypass grafting x 3   Left internal mammary graft to the LAD  SVG to Ramus  SVG to OM  4.   Endoscopic vein harvest from the right leg  Cardiac catheterization 11/17/2018   Ost LAD to Prox LAD lesion is 95% stenosed.  Lat Ramus lesion is 90% stenosed.  Ramus lesion is 90% stenosed.   Echocardiogram 11/17/2018 1. The left ventricle has hyperdynamic systolic function, with an ejection fraction of >65%. The cavity size was normal. There is mild concentric left ventricular hypertrophy. Left ventricular diastolic Doppler parameters are consistent with impaired  relaxation. No evidence of left ventricular regional wall motion abnormalities.  2. The right ventricle has normal  systolic function. The cavity was normal. There is no increase in right ventricular wall thickness.  3.  The aortic valve is tricuspid. Mild sclerosis of the aortic valve. Aortic valve regurgitation was not assessed by color flow Doppler.  4. The interatrial septum appears to be lipomatous.  Patient Profile     78 y.o. female CAD previous stent to LAD 05/22/18 with incomplete expansion and recurrent angina for CABG Monday   Assessment & Plan    1.  CAD - - s/p CABG, pod #3 - steady improvement - she is set up to go home, has plenty of help  2.  Hyperlipidemia - on high-dose statin  3. HTN - on Coreg 3.125 mg bid, will increase to 6.25 mg bid (was taking this dose before surgery) - on Lasix 40 mg po qd, was getting IV Lasix till today - was on lisinopril 40 mg qd prior to surgery, if tolerates increased BB, can start that back, probably at decreased dose   For questions or updates, please contact [place] Please consult [email or web address] for contact info under    CARDIOLOGY RECOMMENDATIONS:  Discharge is anticipated when medically stable. Recommendations for medications and follow up:  Discharge Medications: Continue medications as they are currently listed in the [facility] Exceptions to the above:  Consider restarting lisinopril  Follow Up: The patient's Primary Cardiologist is Quay Burow, MD   Follow up in the office in w/ virtual visit on 05/07 at 2:00 pm w/ Kerin Ransom, PAC  Signed,  Rosaria Ferries, PA-C  9:30 AM 11/24/2018  [facility]     Signed, Rosaria Ferries, PA-C  11/24/2018, 8:44 AM   As above, patient seen and examined.  Patient is doing well from a cardiac standpoint.  She is status post coronary artery bypass and graft.  Continue aspirin and statin.  Continue carvedilol.  Would resume lisinopril for hypertension at discharge.  Kirk Ruths, MD

## 2018-11-25 MED ORDER — VENLAFAXINE HCL ER 75 MG PO CP24
225.0000 mg | ORAL_CAPSULE | Freq: Every day | ORAL | Status: DC
  Administered 2018-11-25 – 2018-11-26 (×2): 225 mg via ORAL
  Filled 2018-11-25 (×2): qty 3

## 2018-11-25 MED ORDER — ALUM & MAG HYDROXIDE-SIMETH 200-200-20 MG/5ML PO SUSP
30.0000 mL | Freq: Four times a day (QID) | ORAL | Status: DC | PRN
  Administered 2018-11-26: 30 mL via ORAL
  Filled 2018-11-25: qty 30

## 2018-11-25 MED ORDER — LISINOPRIL 40 MG PO TABS
40.0000 mg | ORAL_TABLET | Freq: Every day | ORAL | Status: DC
  Administered 2018-11-25 – 2018-11-27 (×3): 40 mg via ORAL
  Filled 2018-11-25 (×3): qty 1

## 2018-11-25 NOTE — Progress Notes (Signed)
Occupational Therapy Treatment Patient Details Name: Monique [name] MRN: [id number] DOB: Jun 08, 1941 Today's Date: 11/25/2018    History of present illness Pt is a 78 y.o. F with significant PMH of CAD with previous stents who is now s/p coronary artery bypass grafting x 3 on 4/20   OT comments  Pt making good progress toward goals. She is less SOB this afternoon and reports she feels less stressed/anxious (regarding a family matter).  Pt performed LB dressing task (donning undergarment) with min assist while adhering to sternal precautions.  She ambulated with min guard with RW in hallway while therapist provided education on home safety and ADLs.  Continue to recommend discharge home. Will continue to follow acutely.   Follow Up Recommendations  Home health OT;Supervision/Assistance - 24 hour    Equipment Recommendations  3 in 1 bedside commode    Recommendations for Other Services      Precautions / Restrictions Precautions Precautions: Fall;Sternal Precaution Booklet Issued: Yes (comment) Precaution Comments: verbally reviewed       Mobility Bed Mobility Overal bed mobility: (pt up in chair)                Transfers Overall transfer level: Needs assistance Equipment used: None Transfers: Sit to/from Stand Sit to Stand: Min guard              Balance                                           ADL either performed or assessed with clinical judgement   ADL Overall ADL's : Needs assistance/impaired                 Upper Body Dressing : Minimal assistance;Standing Upper Body Dressing Details (indicate cue type and reason): Donned gown on back to walk in hallway. Lower Body Dressing: Minimal assistance;Sit to/from stand Lower Body Dressing Details (indicate cue type and reason): donned undergarment             Functional mobility during ADLs: Min guard;Rolling walker General ADL Comments: Pt ambulated in hallway to lounge off  of nurses station. During functional mobility, therapist educated patient on home safety awareness and ADL techniques. Pt reports feeling less anxious/stressed this afternoon (regarding a family matter).      Vision       Perception     Praxis      Cognition Arousal/Alertness: Awake/alert Behavior During Therapy: WFL for tasks assessed/performed Overall Cognitive Status: Within Functional Limits for tasks assessed                                          Exercises     Shoulder Instructions       General Comments      Pertinent Vitals/ Pain       Pain Assessment: Faces Faces Pain Scale: Hurts a little bit Pain Location: general chest Pain Descriptors / Indicators: Sore Pain Intervention(s): Monitored during session  Home Living                                          Prior Functioning/Environment  Frequency  Min 3X/week        Progress Toward Goals  OT Goals(current goals can now be found in the care plan section)  Progress towards OT goals: Progressing toward goals  Acute Rehab OT Goals Patient Stated Goal: "get more energy." OT Goal Formulation: With patient Time For Goal Achievement: 12/07/18 Potential to Achieve Goals: Good ADL Goals Pt Will Perform Grooming: with modified independence;standing Pt Will Perform Upper Body Dressing: with modified independence;sitting Pt Will Perform Lower Body Dressing: with supervision;sit to/from stand Pt Will Transfer to Toilet: with modified independence;ambulating Pt Will Perform Toileting - Clothing Manipulation and hygiene: with modified independence;sit to/from stand Additional ADL Goal #1: Pt will perform bed mobility at mod I level prior to engaging in ADL and while maintaining sternal precautions  Plan Discharge plan remains appropriate;Frequency remains appropriate    Co-evaluation                 AM-PAC OT "6 Clicks" Daily Activity     Outcome  Measure   Help from another person eating meals?: None Help from another person taking care of personal grooming?: A Little Help from another person toileting, which includes using toliet, bedpan, or urinal?: A Little Help from another person bathing (including washing, rinsing, drying)?: A Little Help from another person to put on and taking off regular upper body clothing?: A Little Help from another person to put on and taking off regular lower body clothing?: A Little 6 Click Score: 19    End of Session Equipment Utilized During Treatment: Rolling walker  OT Visit Diagnosis: Unsteadiness on feet (R26.81);Other abnormalities of gait and mobility (R26.89);Muscle weakness (generalized) (M62.81);Pain Pain - Right/Left: Right   Activity Tolerance Patient tolerated treatment well   Patient Left in chair;with call bell/phone within reach   Nurse Communication          Time: [id number] OT Time Calculation (min): 25 min  Charges: OT General Charges $OT Visit: 1 Visit OT Treatments $Self Care/Home Management : 8-22 mins $Therapeutic Activity: 8-22 mins     Cipriano MileJohnson, Matthewjames Petrasek Elizabeth OTR/L Acute Rehabilitation Services [phone] 11/25/2018, 5:11 PM

## 2018-11-25 NOTE — Progress Notes (Signed)
Cardiac Rehab Advisory Cardiac Rehab Phase I is not seeing pts face to face at this time due to Covid 19 restrictions. Ambulation is occurring through nursing, PT, and mobility teams. We will help facilitate that process as needed. We are calling pts in their rooms and discussing education. We will then deliver education materials to pts RN for delivery to pt.   Spoke with pt by phone. Discussed sternal precautions, restrictions at home, IS use, increasing ambulation at home, diet, and CRPII. Good reception, very motivated. Eager to walk today. Will refer to [place] CRPII (pt just did program but would like to do again). She watched d/c video yesterday.  [id number] Monique [name] CES, ACSM 11:49 AM 11/25/2018

## 2018-11-25 NOTE — Progress Notes (Signed)
Physical Therapy Treatment Patient Details Name: Monique [name] MRN: [id number] DOB: [birth year] Today's Date: 11/25/2018    History of Present Illness Pt is a 78 y.o. F with significant PMH of CAD with previous stents who is now s/p coronary artery bypass grafting x 3 on 4/20    PT Comments    Pt expresses feeling more SOB and is mildly discouraged.  Still completed ambulation and stairs, but with extra rests.  Sats were maintained in the lower 90's on RA during activity.   Follow Up Recommendations  Home health PT;Supervision for mobility/OOB     Equipment Recommendations  None recommended by PT    Recommendations for Other Services       Precautions / Restrictions Precautions Precautions: Fall;Sternal Precaution Comments: verbally reviewed Restrictions Weight Bearing Restrictions: Yes Other Position/Activity Restrictions: sternal precautions    Mobility  Bed Mobility               General bed mobility comments: discussed/reinforced based on sternal precautions, not practiced  Transfers Overall transfer level: Needs assistance Equipment used: None Transfers: Sit to/from Stand Sit to Stand: Min guard         General transfer comment: from recliner and chair, no use of UE's  Ambulation/Gait Ambulation/Gait assistance: Supervision Gait Distance (Feet): 140 Feet(25, then additional 140 feet back.) Assistive device: Rolling walker (2 wheeled) Gait Pattern/deviations: Step-through pattern Gait velocity: decr   General Gait Details: slower, having feeling of SOB.  Sats on RA with activity 90-92%, then improving to 96% with rest.  EHR into the 100's   Stairs Stairs: Yes Stairs assistance: Min assist Stair Management: One rail Left;Step to pattern;Forwards Number of Stairs: 3 General stair comments: safe with rail, though pt would likely feel more comfortable with assist.   Wheelchair Mobility    Modified Rankin (Stroke Patients Only)        Balance Overall balance assessment: Needs assistance   Sitting balance-Leahy Scale: Good       Standing balance-Leahy Scale: Fair                              Cognition Arousal/Alertness: Awake/alert Behavior During Therapy: WFL for tasks assessed/performed Overall Cognitive Status: Within Functional Limits for tasks assessed                                        Exercises      General Comments        Pertinent Vitals/Pain Pain Assessment: Faces Faces Pain Scale: Hurts a little bit Pain Location: general chest Pain Descriptors / Indicators: Sore Pain Intervention(s): Monitored during session    Home Living                      Prior Function            PT Goals (current goals can now be found in the care plan section) Acute Rehab PT Goals Patient Stated Goal: "get more energy." PT Goal Formulation: With patient Time For Goal Achievement: 12/07/18 Potential to Achieve Goals: Good Progress towards PT goals: Progressing toward goals    Frequency    Min 3X/week      PT Plan Current plan remains appropriate    Co-evaluation              AM-PAC PT "6 Clicks" Mobility  Outcome Measure  Help needed turning from your back to your side while in a flat bed without using bedrails?: A Little Help needed moving from lying on your back to sitting on the side of a flat bed without using bedrails?: A Little Help needed moving to and from a bed to a chair (including a wheelchair)?: None Help needed standing up from a chair using your arms (e.g., wheelchair or bedside chair)?: A Little Help needed to walk in hospital room?: None Help needed climbing 3-5 steps with a railing? : A Little 6 Click Score: 20    End of Session   Activity Tolerance: Patient tolerated treatment well;Patient limited by fatigue Patient left: in chair;with call bell/phone within reach Nurse Communication: Mobility status PT Visit Diagnosis:  Other abnormalities of gait and mobility (R26.89);Difficulty in walking, not elsewhere classified (R26.2)     Time: [id number] PT Time Calculation (min) (ACUTE ONLY): 30 min  Charges:  $Gait Training: 8-22 mins $Therapeutic Activity: 8-22 mins                     11/25/2018  [place] BingKen Rollins Wrightson, PT Acute Rehabilitation Services [phone]  (pager) [phone]  (office)   Monique [name] 11/25/2018, 12:29 PM

## 2018-11-25 NOTE — Progress Notes (Addendum)
Monique [name]       [place] [address]             [phone]      4 Days Post-Op Procedure(s) (LRB): CORONARY ARTERY BYPASS GRAFTING (CABG) x Three , using left internal mammary artery and right leg greater saphenous vein harvested endoscopically (N/A) TRANSESOPHAGEAL ECHOCARDIOGRAM (TEE) (N/A) Subjective: Feels ok, some anxiety from a family issue  Objective: Vital signs in last 24 hours: Temp:  [98 F (36.7 C)-98.3 F (36.8 C)] 98.2 F (36.8 C) (04/24 0431) Pulse Rate:  [87-102] 101 (04/24 0431) Cardiac Rhythm: Normal sinus rhythm (04/23 2034) Resp:  [17-19] 19 (04/23 2323) BP: (113-176)/(45-65) 156/57 (04/24 0431) SpO2:  [95 %-100 %] 95 % (04/24 0431) Weight:  [85.9 kg] 85.9 kg (04/24 0431)  Hemodynamic parameters for last 24 hours:    Intake/Output from previous day: 04/23 0701 - 04/24 0700 In: 123 [P.O.:120; I.V.:3] Out: -  Intake/Output this shift: No intake/output data recorded.  General appearance: alert, cooperative and no distress Heart: regular rate and rhythm Lungs: dim in bases Abdomen: benign Extremities: + edema Wound: incis healing well  Lab Results: Recent Labs    11/23/18 0533 11/24/18 0248  WBC 9.1 8.6  HGB 7.5* 7.4*  HCT 23.2* 23.1*  PLT 91* 110*   BMET:  Recent Labs    11/23/18 0533 11/24/18 0248  NA 140 141  K 4.1 3.7  CL 107 107  CO2 25 25  GLUCOSE 151* 151*  BUN 17 10  CREATININE 0.56 0.52  CALCIUM 8.4* 8.4*    PT/INR: No results for input(s): LABPROT, INR in the last 72 hours. ABG    Component Value Date/Time   PHART 7.328 (L) 11/21/2018 2333   HCO3 24.9 11/21/2018 2333   TCO2 26 11/21/2018 2333   ACIDBASEDEF 1.0 11/21/2018 2333   O2SAT 99.0 11/21/2018 2333   CBG (last 3)  Recent Labs    11/22/18 2345 11/23/18 0444 11/23/18 0749  GLUCAP 107* 122* 143*    Meds Scheduled Meds: . acetaminophen  1,000 mg Oral Q6H   Or  . acetaminophen (TYLENOL) oral liquid 160 mg/5 mL  1,000 mg Per  Tube Q6H  . albuterol  3 mL Inhalation Q6H  . aspirin EC  325 mg Oral Daily   Or  . aspirin  324 mg Per Tube Daily  . atorvastatin  80 mg Oral q1800  . bisacodyl  10 mg Oral Daily   Or  . bisacodyl  10 mg Rectal Daily  . carvedilol  6.25 mg Oral BID WC  . chlorhexidine gluconate (MEDLINE KIT)  15 mL Mouth Rinse BID  . Chlorhexidine Gluconate Cloth  6 each Topical Daily  . docusate sodium  200 mg Oral Daily  . ferrous [id number] C-folic acid  1 capsule Oral BID  . furosemide  40 mg Oral Daily  . ketotifen  1 drop Both Eyes BID  . loratadine  10 mg Oral Daily  . pantoprazole  40 mg Oral Daily  . potassium chloride  20 mEq Oral BID  . sodium chloride flush  3 mL Intravenous Q12H  . sodium chloride flush  3 mL Intravenous Q12H   Continuous Infusions: . sodium chloride 10 mL/hr (11/23/18 0824)  . sodium chloride    . sodium chloride 20 mL/hr at 11/21/18 1338  . sodium chloride    . lactated ringers    . lactated ringers Stopped (11/22/18 0508)   PRN Meds:.sodium chloride, sodium chloride,  ondansetron (ZOFRAN) IV, oxyCODONE, sodium chloride flush, sodium chloride flush, traMADol  Xrays Dg Chest 2 View  Result Date: 11/24/2018 CLINICAL DATA:  78 year old female status post CABG postoperative day 3. EXAM: CHEST - 2 VIEW COMPARISON:  11/23/2018 and earlier. FINDINGS: PA and lateral views. Right IJ introducer sheath remains in place. Epicardial pacer wires remain in place. Small bilateral pleural effusions. Low lung volumes. No pneumothorax or pulmonary edema. Stable cardiac size and mediastinal contours. Visualized tracheal air column is within normal limits. Negative visible bowel gas pattern. Recent sternotomy. No acute osseous abnormality identified. IMPRESSION: 1. Low lung volumes with small bilateral pleural effusions. 2. Right IJ introducer sheath remains in place. Electronically Signed   By: Genevie Dajah M.D.   On: 11/24/2018 06:53    Assessment/Plan: S/P Procedure(s) (LRB):  CORONARY ARTERY BYPASS GRAFTING (CABG) x Three , using left internal mammary artery and right leg greater saphenous vein harvested endoscopically (N/A) TRANSESOPHAGEAL ECHOCARDIOGRAM (TEE) (N/A)   1 conts to do well 2 hemodyn stable in sinus rhythm, some htn, will restart lisinopril, cont gentle diuresis 3 no new labs or CXR 4 BS reasonable control, HgA1C 6.1- no preop meds 5 restart effexor 6 routine pulm toilet and rehab 7 home prob 1-2 days if no changes   LOS: 8 days    Monique [name] [facility] 11/25/2018 Pager [phone]   Chart reviewed, patient examined, agree with above. Did not sleep well but feels ok this am.  Says she ambulated to nursing station and felt short of breath on the way back but thinks it may be anxiety. CXR yesterday showed low lung volumes and small pleural effusions. Wt is still about 6 lbs over preop. Continue diuresis. Hgb yesterday was 7.4 and stable. Will repeat it in am. If stable can remove sleeve from neck tomorrow but would wait until lab result back in case Hgb drops further and she needs transfusion.

## 2018-11-26 LAB — CBC
HCT: 22.8 % — ABNORMAL LOW (ref 36.0–46.0)
Hemoglobin: 7.1 g/dL — ABNORMAL LOW (ref 12.0–15.0)
MCH: 30.7 pg (ref 26.0–34.0)
MCHC: 31.1 g/dL (ref 30.0–36.0)
MCV: 98.7 fL (ref 80.0–100.0)
Platelets: 160 10*3/uL (ref 150–400)
RBC: 2.31 MIL/uL — ABNORMAL LOW (ref 3.87–5.11)
RDW: 15.9 % — ABNORMAL HIGH (ref 11.5–15.5)
WBC: 7.1 10*3/uL (ref 4.0–10.5)
nRBC: 2.4 % — ABNORMAL HIGH (ref 0.0–0.2)

## 2018-11-26 MED ORDER — ALBUTEROL SULFATE (2.5 MG/3ML) 0.083% IN NEBU
3.0000 mL | INHALATION_SOLUTION | Freq: Four times a day (QID) | RESPIRATORY_TRACT | Status: DC | PRN
  Administered 2018-11-26 – 2018-11-27 (×2): 3 mL via RESPIRATORY_TRACT
  Filled 2018-11-26 (×2): qty 3

## 2018-11-26 MED ORDER — FUROSEMIDE 40 MG PO TABS
40.0000 mg | ORAL_TABLET | Freq: Once | ORAL | Status: AC
  Administered 2018-11-26: 17:00:00 40 mg via ORAL
  Filled 2018-11-26: qty 1

## 2018-11-26 NOTE — Progress Notes (Signed)
Pt assisted to ambulate 200 ft in hall using RW.   No complaints, no tele alarms.  HR 100-115 during walk.  To recliner after and IJ dressing replaced/reinforced.  Will con't plan of care.

## 2018-11-26 NOTE — Progress Notes (Signed)
5 Days Post-Op Procedure(s) (LRB): CORONARY ARTERY BYPASS GRAFTING (CABG) x Three , using left internal mammary artery and right leg greater saphenous vein harvested endoscopically (N/A) TRANSESOPHAGEAL ECHOCARDIOGRAM (TEE) (N/A) Subjective: Still some shortness of breath  Objective: Vital signs in last 24 hours: Temp:  [97.6 F (36.4 C)-98.3 F (36.8 C)] 97.6 F (36.4 C) (04/25 0555) Pulse Rate:  [94-102] 94 (04/25 0825) Cardiac Rhythm: Normal sinus rhythm (04/25 0825) Resp:  [18-20] 18 (04/25 0000) BP: (90-142)/(46-56) 90/56 (04/25 0825) SpO2:  [91 %-99 %] 93 % (04/25 0858) Weight:  [87.4 kg] 87.4 kg (04/25 0630)  Hemodynamic parameters for last 24 hours:    Intake/Output from previous day: 04/24 0701 - 04/25 0700 In: 483 [P.O.:480; I.V.:3] Out: -  Intake/Output this shift: No intake/output data recorded.  General appearance: alert, cooperative and no distress Neurologic: intact Heart: regular rate and rhythm Lungs: diminished breath sounds bibasilar Abdomen: normal findings: soft, non-tender Extremities: edema 1+  Lab Results: Recent Labs    11/24/18 0248 11/26/18 0549  WBC 8.6 7.1  HGB 7.4* 7.1*  HCT 23.1* 22.8*  PLT 110* 160   BMET:  Recent Labs    11/24/18 0248  NA 141  K 3.7  CL 107  CO2 25  GLUCOSE 151*  BUN 10  CREATININE 0.52  CALCIUM 8.4*    PT/INR: No results for input(s): LABPROT, INR in the last 72 hours. ABG    Component Value Date/Time   PHART 7.328 (L) 11/21/2018 2333   HCO3 24.9 11/21/2018 2333   TCO2 26 11/21/2018 2333   ACIDBASEDEF 1.0 11/21/2018 2333   O2SAT 99.0 11/21/2018 2333   CBG (last 3)  No results for input(s): GLUCAP in the last 72 hours.  Assessment/Plan: S/P Procedure(s) (LRB): CORONARY ARTERY BYPASS GRAFTING (CABG) x Three , using left internal mammary artery and right leg greater saphenous vein harvested endoscopically (N/A) TRANSESOPHAGEAL ECHOCARDIOGRAM (TEE) (N/A) -Overall doing well Weight is still up  significantly, will give extra dose of lasix this afternoon Probably home in AM   LOS: 9 days    Loreli Slot 11/26/2018

## 2018-11-26 NOTE — Progress Notes (Signed)
Occupational Therapy Treatment Patient Details Name: Monique [name] MRN: [id number] DOB: [birth year] Today's Date: 11/26/2018    History of present illness Pt is a 78 y.o. F with significant PMH of CAD with previous stents who is now s/p coronary artery bypass grafting x 3 on 4/20   OT comments  Pt progressing towards established OT goals. Pt donning LB clothing with supervision demonstrating adherence to sternal precautions. Pt requiring Min A for donning UB clothing due to poor ROM at shoulder with arthritis. Pt reporting fatigue and required multiple seated rest breaks during dressing. Pt performing functional mobility to sink for grooming with Min Guard A and no DME; then mobility in hallway with RW and Min Guard A. Continue to recommend dc home with HHOT once medically stable per physician and will continue to follow acutely.    Follow Up Recommendations  Home health OT;Supervision/Assistance - 24 hour    Equipment Recommendations  3 in 1 bedside commode    Recommendations for Other Services      Precautions / Restrictions Precautions Precautions: Fall;Sternal Precaution Booklet Issued: Yes (comment) Precaution Comments: verbally reviewed Restrictions Weight Bearing Restrictions: Yes(sternal precautions) Other Position/Activity Restrictions: sternal precautions       Mobility Bed Mobility               General bed mobility comments: In recliner upon arrival  Transfers Overall transfer level: Needs assistance Equipment used: None Transfers: Sit to/from Stand Sit to Stand: Min guard         General transfer comment: from recliner and chair, no use of UE's    Balance Overall balance assessment: Needs assistance   Sitting balance-Leahy Scale: Good       Standing balance-Leahy Scale: Fair Standing balance comment: abld to static stand, BUE for dynamic                           ADL either performed or assessed with clinical judgement   ADL  Overall ADL's : Needs assistance/impaired                 Upper Body Dressing : Minimal assistance;Standing Upper Body Dressing Details (indicate cue type and reason): Min A for donning shirt and robe due yto limited ROM at shoulders with RA Lower Body Dressing: Sit to/from stand;Supervision/safety Lower Body Dressing Details (indicate cue type and reason): Donning underwear and pants with supervision for safety Toilet Transfer: Supervision/safety(simulated in room)     Toileting - Clothing Manipulation Details (indicate cue type and reason): Discussed toilet hygiene techniques     Functional mobility during ADLs: Min guard General ADL Comments: Pt demonstrating increased balance. Requiring several rest brekas during dressing and continues to present with decreased activity toelrance to her normal     Vision       Perception     Praxis      Cognition Arousal/Alertness: Awake/alert Behavior During Therapy: WFL for tasks assessed/performed Overall Cognitive Status: Within Functional Limits for tasks assessed                                          Exercises     Shoulder Instructions       General Comments HR elevating to 124 with mobility in hallway    Pertinent Vitals/ Pain       Pain Assessment: 0-10 Pain Score: 8  Pain Location: general chest Pain Descriptors / Indicators: Sore Pain Intervention(s): Monitored during session;Limited activity within patient's tolerance;Repositioned  Home Living                                          Prior Functioning/Environment              Frequency  Min 3X/week        Progress Toward Goals  OT Goals(current goals can now be found in the care plan section)  Progress towards OT goals: Progressing toward goals  Acute Rehab OT Goals Patient Stated Goal: "get more energy." OT Goal Formulation: With patient Time For Goal Achievement: 12/07/18 Potential to Achieve Goals:  Good ADL Goals Pt Will Perform Grooming: with modified independence;standing Pt Will Perform Upper Body Dressing: with modified independence;sitting Pt Will Perform Lower Body Dressing: with supervision;sit to/from stand Pt Will Transfer to Toilet: with modified independence;ambulating Pt Will Perform Toileting - Clothing Manipulation and hygiene: with modified independence;sit to/from stand Additional ADL Goal #1: Pt will perform bed mobility at mod I level prior to engaging in ADL and while maintaining sternal precautions  Plan Discharge plan remains appropriate;Frequency remains appropriate    Co-evaluation                 AM-PAC OT "6 Clicks" Daily Activity     Outcome Measure   Help from another person eating meals?: None Help from another person taking care of personal grooming?: A Little Help from another person toileting, which includes using toliet, bedpan, or urinal?: A Little Help from another person bathing (including washing, rinsing, drying)?: A Little Help from another person to put on and taking off regular upper body clothing?: A Little Help from another person to put on and taking off regular lower body clothing?: A Little 6 Click Score: 19    End of Session    OT Visit Diagnosis: Unsteadiness on feet (R26.81);Other abnormalities of gait and mobility (R26.89);Muscle weakness (generalized) (M62.81);Pain Pain - Right/Left: Right Pain - part of body: (sternum)   Activity Tolerance Patient tolerated treatment well   Patient Left in chair;with call bell/phone within reach   Nurse Communication Mobility status        Time: [id number] OT Time Calculation (min): 33 min  Charges: OT General Charges $OT Visit: 1 Visit OT Treatments $Self Care/Home Management : 23-37 mins  Estellar Cadena MSOT, OTR/L Acute Rehab Pager: [phone] Office: [phone]   Theodoro GristCharis M [facility] 11/26/2018, 9:41 AM

## 2018-11-27 ENCOUNTER — Other Ambulatory Visit: Payer: Self-pay | Admitting: Physician Assistant

## 2018-11-27 MED ORDER — CARVEDILOL 6.25 MG PO TABS: 6.2500 mg | ORAL_TABLET | Freq: Two times a day (BID) | ORAL | 1 refills | Status: DC

## 2018-11-27 MED ORDER — FUROSEMIDE 40 MG PO TABS: 40.0000 mg | ORAL_TABLET | Freq: Every day | ORAL | 0 refills | Status: DC

## 2018-11-27 MED ORDER — OXYCODONE HCL 5 MG PO TABS: 5.0000 mg | ORAL_TABLET | Freq: Four times a day (QID) | ORAL | 0 refills | Status: DC | PRN

## 2018-11-27 MED ORDER — POTASSIUM CHLORIDE ER 20 MEQ PO TBCR: 20.0000 meq | EXTENDED_RELEASE_TABLET | Freq: Every day | ORAL | 0 refills | Status: DC

## 2018-11-27 NOTE — Progress Notes (Signed)
6 Days Post-Op Procedure(s) (LRB): CORONARY ARTERY BYPASS GRAFTING (CABG) x Three , using left internal mammary artery and right leg greater saphenous vein harvested endoscopically (N/A) TRANSESOPHAGEAL ECHOCARDIOGRAM (TEE) (N/A) Subjective: Some right parasternal chest pain, didn't sleep well last night Wants to go home  Objective: Vital signs in last 24 hours: Temp:  [97.5 F (36.4 C)-98.3 F (36.8 C)] 97.9 F (36.6 C) (04/26 0856) Pulse Rate:  [86-99] 91 (04/26 0856) Cardiac Rhythm: Normal sinus rhythm (04/26 0740) Resp:  [17-19] 17 (04/26 0856) BP: (93-129)/(46-78) 129/54 (04/26 0856) SpO2:  [91 %-98 %] 98 % (04/26 0856)  Hemodynamic parameters for last 24 hours:    Intake/Output from previous day: 04/25 0701 - 04/26 0700 In: 570 [P.O.:570] Out: -  Intake/Output this shift: Total I/O In: 220 [P.O.:220] Out: -   General appearance: alert, cooperative and no distress Neurologic: intact Heart: regular rate and rhythm Lungs: diminished breath sounds bibasilar Extremities: edema 1+ Wound: clean and dry  Lab Results: Recent Labs    11/26/18 0549  WBC 7.1  HGB 7.1*  HCT 22.8*  PLT 160   BMET: No results for input(s): NA, K, CL, CO2, GLUCOSE, BUN, CREATININE, CALCIUM in the last 72 hours.  PT/INR: No results for input(s): LABPROT, INR in the last 72 hours. ABG    Component Value Date/Time   PHART 7.328 (L) 11/21/2018 2333   HCO3 24.9 11/21/2018 2333   TCO2 26 11/21/2018 2333   ACIDBASEDEF 1.0 11/21/2018 2333   O2SAT 99.0 11/21/2018 2333   CBG (last 3)  No results for input(s): GLUCAP in the last 72 hours.  Assessment/Plan: S/P Procedure(s) (LRB): CORONARY ARTERY BYPASS GRAFTING (CABG) x Three , using left internal mammary artery and right leg greater saphenous vein harvested endoscopically (N/A) TRANSESOPHAGEAL ECHOCARDIOGRAM (TEE) (N/A) Plan for discharge: see discharge orders  Still edematous will keep on lasix another week Dc central line Dc home  today   LOS: 10 days    Monique [name] 11/27/2018

## 2018-11-27 NOTE — Progress Notes (Signed)
Spoke with Kizzie Furnish, RN , aware of d/c CVC order.

## 2018-11-27 NOTE — Progress Notes (Signed)
Progress Note  Patient Name: Monique [name] Date of Encounter: 11/27/2018  Primary Cardiologist: Quay Burow, MD   Subjective   She denies any complaints other than mild pain the the sternotomy site.   Inpatient Medications    Scheduled Meds: . aspirin EC  325 mg Oral Daily   Or  . aspirin  324 mg Per Tube Daily  . atorvastatin  80 mg Oral q1800  . carvedilol  6.25 mg Oral BID WC  . chlorhexidine gluconate (MEDLINE KIT)  15 mL Mouth Rinse BID  . Chlorhexidine Gluconate Cloth  6 each Topical Daily  . docusate sodium  200 mg Oral Daily  . ferrous [id number] C-folic acid  1 capsule Oral BID  . furosemide  40 mg Oral Daily  . ketotifen  1 drop Both Eyes BID  . lisinopril  40 mg Oral Daily  . loratadine  10 mg Oral Daily  . pantoprazole  40 mg Oral Daily  . potassium chloride  20 mEq Oral BID  . sodium chloride flush  3 mL Intravenous Q12H  . venlafaxine XR  225 mg Oral QHS   Continuous Infusions:  PRN Meds: albuterol, alum & mag hydroxide-simeth, ondansetron (ZOFRAN) IV, oxyCODONE, sodium chloride flush, traMADol   Vital Signs    Vitals:   11/27/18 0030 11/27/18 0307 11/27/18 0520 11/27/18 0856  BP: (!) 108/55 126/78 (!) 125/46 (!) 129/54  Pulse:   99 91  Resp: _0 Temp: 98.3 F (36.8 C) 98.1 F (36.7 C)  97.9 F (36.6 C)  TempSrc: Oral Oral Oral Oral  SpO2: 93% 91% 91% 98%  Weight:      Height:        Intake/Output Summary (Last 24 hours) at 11/27/2018 1013 Last data filed at 11/27/2018 0901 Gross per 24 hour  Intake 790 ml  Output -  Net 790 ml   Last 3 Weights 11/26/2018 11/25/2018 11/24/2018  Weight (lbs) 192 lb 10.9 oz 189 lb 6 oz 190 lb  Weight (kg) 87.4 kg 85.9 kg 86.183 kg      Telemetry    SR - Personally Reviewed  ECG    SR - Personally Reviewed  Physical Exam   GEN: No acute distress.   Neck: No JVD Cardiac: RRR, no murmurs, rubs, or gallops.  Respiratory: minimal crackles to auscultation bilaterally. GI:  Soft, nontender, non-distended  MS: mild non-pitting B/L edema; No deformity. Neuro:  Nonfocal  Psych: Normal affect   Labs    Chemistry Recent Labs  Lab 11/20/18 2113  11/22/18 1604 11/23/18 0533 11/24/18 0248  NA 137   < > 140 140 141  K 3.8   < > 3.7 4.1 3.7  CL 102   < > 107 107 107  CO2 29   < > _1 GLUCOSE 135*   < > 198* 151* 151*  BUN 18   < > _2 CREATININE 0.65   < > 0.78 0.56 0.52  CALCIUM 8.8*   < > 8.2* 8.4* 8.4*  PROT 5.6*  --   --   --   --   ALBUMIN 3.5  --   --   --   --   AST 21  --   --   --   --   ALT 27  --   --   --   --   ALKPHOS 67  --   --   --   --   BILITOT  1.1  --   --   --   --   GFRNONAA >60   < > >60 >60 >60  GFRAA >60   < > >60 >60 >60  ANIONGAP 6   < > _0 < > = values in this interval not displayed.     Hematology Recent Labs  Lab 11/23/18 0533 11/24/18 0248 11/26/18 0549  WBC 9.1 8.6 7.1  RBC 2.41* 2.39* 2.31*  HGB 7.5* 7.4* 7.1*  HCT 23.2* 23.1* 22.8*  MCV 96.3 96.7 98.7  MCH 31.1 31.0 30.7  MCHC 32.3 32.0 31.1  RDW 15.7* 15.5 15.9*  PLT 91* 110* 160    Cardiac EnzymesNo results for input(s): TROPONINI in the last 168 hours. No results for input(s): TROPIPOC in the last 168 hours.   BNPNo results for input(s): BNP, PROBNP in the last 168 hours.   DDimer No results for input(s): DDIMER in the last 168 hours.   Radiology    No results found.  Cardiac Studies   TTE: 11/17/2018   1. The left ventricle has hyperdynamic systolic function, with an ejection fraction of >65%. The cavity size was normal. There is mild concentric left ventricular hypertrophy. Left ventricular diastolic Doppler parameters are consistent with impaired  relaxation. No evidence of left ventricular regional wall motion abnormalities.  2. The right ventricle has normal systolic function. The cavity was normal. There is no increase in right ventricular wall thickness.  3. The aortic valve is tricuspid. Mild sclerosis of the aortic  valve. Aortic valve regurgitation was not assessed by color flow Doppler.  4. The interatrial septum appears to be lipomatous.    Patient Profile     78 y.o. female   Assessment & Plan    1. CAD, S/P CABG x 3 (LIMA to LAD, SVG to Ramus, SVG to OM) on 11/21/2018 - good recovery 2. Acute diastolic CHF- mild fluid overload, continue daily lasix 3. Hypertension - continue carvedilol, lisinopril 4. HLP - continue atorvastatin  [facility] will sign off.   Medication Recommendations:  As above Other recommendations (labs, testing, etc):  No further testing Follow up as an outpatient:  We will arrange  For questions or updates, please contact [place] Please consult [email or web address] for contact info under        Signed, Ena Dawley, MD  11/27/2018, 10:13 AM

## 2018-11-28 DIAGNOSIS — Z48812 Encounter for surgical aftercare following surgery on the circulatory system: Secondary | ICD-10-CM | POA: Diagnosis not present

## 2018-12-07 ENCOUNTER — Telehealth: Payer: Self-pay | Admitting: Cardiology

## 2018-12-07 NOTE — Telephone Encounter (Signed)
Smartphone, mychart, pre reg complete 12/07/18 AF

## 2018-12-08 ENCOUNTER — Encounter: Payer: Self-pay | Admitting: Cardiology

## 2018-12-08 ENCOUNTER — Telehealth: Payer: Self-pay

## 2018-12-08 ENCOUNTER — Telehealth (INDEPENDENT_AMBULATORY_CARE_PROVIDER_SITE_OTHER): Payer: Medicare Other | Admitting: Cardiology

## 2018-12-08 VITALS — BP 127/70 | HR 94

## 2018-12-08 DIAGNOSIS — Z9861 Coronary angioplasty status: Secondary | ICD-10-CM | POA: Diagnosis not present

## 2018-12-08 DIAGNOSIS — Z951 Presence of aortocoronary bypass graft: Secondary | ICD-10-CM

## 2018-12-08 DIAGNOSIS — I2510 Atherosclerotic heart disease of native coronary artery without angina pectoris: Secondary | ICD-10-CM

## 2018-12-08 NOTE — Patient Instructions (Addendum)
Medication Instructions:  Your physician recommends that you continue on your current medications as directed. Please refer to the Current Medication list given to you today. If you need a refill on your cardiac medications before your next appointment, please call your pharmacy.   Lab work: PLEASE COMPLETE CBC LAB WORK AT YOUR PCP OFFICE If you have labs (blood work) drawn today and your tests are completely normal, you will receive your results only by: Marland Kitchen MyChart Message (if you have MyChart) OR . A paper copy in the mail If you have any lab test that is abnormal or we need to change your treatment, we will call you to review the results.  Testing/Procedures: None   Follow-Up: At [facility], you and your health needs are our priority.  As part of our continuing mission to provide you with exceptional heart care, we have created designated Provider Care Teams.  These Care Teams include your primary Cardiologist (physician) and Advanced Practice Providers (APPs -  Physician Assistants and Nurse Practitioners) who all work together to provide you with the care you need, when you need it. You will need a follow up appointment in 3 months.  Please call our office 2 months in advance to schedule this appointment.  You may see [name], MD or one of the following Advanced Practice Providers on your designated Care Team:   Corine Shelter, PA-C Judy Pimple, [place] . Marjie Skiff, PA-C  Any Other Special Instructions Will Be Listed Below (If Applicable).

## 2018-12-08 NOTE — Telephone Encounter (Signed)
Contacted patient to discuss AVS instructions. Patient agreeable with Luke recommendations and voiced understanding. 

## 2018-12-08 NOTE — Progress Notes (Signed)
Virtual Visit via Video Note   This visit type was conducted due to national recommendations for restrictions regarding the COVID-19 Pandemic (e.g. social distancing) in an effort to limit this patient's exposure and mitigate transmission in our community.  Due to her co-morbid illnesses, this patient is at least at moderate risk for complications without adequate follow up.  This format is felt to be most appropriate for this patient at this time.  All issues noted in this document were discussed and addressed.  A limited physical exam was performed with this format.  Please refer to the patient's chart for her consent to telehealth for [facility].   Date:  12/08/2018   ID:  Monique [name], DOB 02/07/1941, MRN [id number]  Patient Location: Home Provider Location: Home  PCP:  Elizabeth PalauAnderson, Teresa, FNP  Cardiologist:  Nanetta BattyJonathan Berry, MD  Electrophysiologist:  None   Evaluation Performed:  Follow-Up Visit  Chief Complaint:  none  History of Present Illness:    Monique [name] is a 78 y.o. female with a history of coronary disease.  She had an LAD PCI with DES in October 2019.  She had a residual 80% ramus branch.  The LAD was a small vessel and there was a 50% residual narrowing after PCI.  She developed recurrent chest pain consistent with angina in April 2020.  Catheterization revealed a 95% stenosis at the previously placed LAD stent, and high-grade ramus intermedius and high-grade OM stenosis.  She ultimately underwent bypass grafting x3 on 11/21/2018 with an LIMA to LAD, SVG to RI, and SVG to OM.  Her ejection fraction was greater than 65%.  She was discharged 11/24/2018.  She is seen today as a video visit following her CABG.  She says she is doing well, she is still weak.  A physical therapist has been visiting her and told the patient she thinks she is doing well.  The patient did have significant anemia preop, no GI work-up was undertaken because she was on Plavix at that time.  Her  hemoglobin postop was 7.1.  She was discharged on iron therapy.  Prior to her CABG her hemoglobin had improved to 9 on iron therapy.  The patient does not have symptoms concerning for COVID-19 infection (fever, chills, cough, or new shortness of breath).    Past Medical History:  Diagnosis Date  . Anemia     mild as per PCP  . Arthritis    hip/ s/p recent shoulder fracture 8/12- RIGHT  . Asthma   . Coronary artery disease 05/30/2018   LAD PCI/DES  . GI bleeding   . Hiatal hernia   . Humerus fracture    with wrist on right side  . Hypertension    hypercholesterolemia/  EKG on chart with clearance and note 06/18/11  Mazzocchi  . Pneumonia    Past Surgical History:  Procedure Laterality Date  . ABDOMINAL HYSTERECTOMY    . COLECTOMY WITH COLOSTOMY CREATION/HARTMANN PROCEDURE N/A 05/07/2016   Procedure: COLOSTOMY CREATION/HARTMANN PROCEDURE;  Surgeon: Romie LeveeAlicia Thomas, MD;  Location: [facility];  Service: General;  Laterality: N/A;  . COLONOSCOPY N/A 08/26/2016   Procedure: COLONOSCOPY;  Surgeon: Romie LeveeAlicia Thomas, MD;  Location: [facility] END[facility];  Service: Endoscopy;  Laterality: N/A;  . COLOSTOMY TAKEDOWN N/A 08/27/2016   Procedure: LAPAROSCOPIC COLOSTOMY REVERSAL;  Surgeon: Romie LeveeAlicia Thomas, MD;  Location: [facility];  Service: General;  Laterality: N/A;  . CORONARY ARTERY BYPASS GRAFT N/A 11/21/2018   Procedure: CORONARY ARTERY BYPASS GRAFTING (CABG) x Three ,  using left internal mammary artery and right leg greater saphenous vein harvested endoscopically;  Surgeon: Alleen Borne, MD;  Location: [facility] OR;  Service: Open Heart Surgery;  Laterality: N/A;  . CORONARY STENT INTERVENTION N/A 05/30/2018   Procedure: CORONARY STENT INTERVENTION;  Surgeon: Runell Gess, MD;  Location: [facility] INVASIVE CV LAB;  Service: Cardiovascular;  Laterality: N/A;  . EYE SURGERY     eyelid tuck bilateral  . LAPAROTOMY N/A 05/07/2016   Procedure: EXPLORATORY LAPAROTOMY, LYLSIS OF ADHSIONS, EVACATION OF PERITONEAL ABSCESS;   Surgeon: Romie Levee, MD;  Location: [facility];  Service: General;  Laterality: N/A;  . LEFT HEART CATH AND CORONARY ANGIOGRAPHY N/A 05/30/2018   Procedure: LEFT HEART CATH AND CORONARY ANGIOGRAPHY;  Surgeon: Runell Gess, MD;  Location: [facility];  Service: Cardiovascular;  Laterality: N/A;  . LEFT HEART CATH AND CORONARY ANGIOGRAPHY N/A 11/17/2018   Procedure: LEFT HEART CATH AND CORONARY ANGIOGRAPHY;  Surgeon: Runell Gess, MD;  Location: [facility];  Service: Cardiovascular;  Laterality: N/A;  . PARATHYROIDECTOMY     one lobe- benign per pt  . TEE WITHOUT CARDIOVERSION N/A 11/21/2018   Procedure: TRANSESOPHAGEAL ECHOCARDIOGRAM (TEE);  Surgeon: Alleen Borne, MD;  Location: [facility];  Service: Open Heart Surgery;  Laterality: N/A;  . TONSILLECTOMY    . TOTAL HIP ARTHROPLASTY  07/08/2011   Procedure: TOTAL HIP ARTHROPLASTY;  Surgeon: Gus Rankin Aluisio;  Location: [facility];  Service: Orthopedics;  Laterality: Right;     Current Meds  Medication Sig  . aspirin EC 81 MG tablet Take 81 mg by mouth daily.  Marland Kitchen atorvastatin (LIPITOR) 80 MG tablet Take 1 tablet (80 mg total) by mouth daily at 6 PM.  . carvedilol (COREG) 6.25 MG tablet Take 1 tablet (6.25 mg total) by mouth 2 (two) times daily with a meal.  . cetirizine (ZYRTEC) 10 MG tablet Take 10 mg by mouth at bedtime.   . Cholecalciferol (VITAMIN D PO) Take 1,000 Units by mouth daily.   . clopidogrel (PLAVIX) 75 MG tablet Take 1 tablet (75 mg total) by mouth daily with breakfast.  . ferrous sulfate (FERROUSUL) 325 (65 FE) MG tablet Take 1 tablet (325 mg total) by mouth 2 (two) times daily with a meal.  . furosemide (LASIX) 40 MG tablet Take 1 tablet (40 mg total) by mouth daily for 7 days.  Marland Kitchen lisinopril (PRINIVIL,ZESTRIL) 40 MG tablet Take 40 mg by mouth daily.  Marland Kitchen MAGNESIUM OXIDE PO Take 125 mg by mouth daily.   Marland Kitchen oxyCODONE (OXY IR/ROXICODONE) 5 MG immediate release tablet Take 1 tablet (5 mg total) by mouth every 6 (six)  hours as needed for severe pain.  . pantoprazole (PROTONIX) 40 MG tablet Take 1 tablet (40 mg total) by mouth 2 (two) times daily before a meal.  . polyethylene glycol (MIRALAX / GLYCOLAX) packet Take 17 g by mouth 2 (two) times daily. (Patient taking differently: Take 17 g by mouth at bedtime as needed for moderate constipation. )  . venlafaxine XR (EFFEXOR-XR) 75 MG 24 hr capsule Take 225 mg by mouth at bedtime.  . VENTOLIN HFA 108 (90 Base) MCG/ACT inhaler Inhale 2 puffs into the lungs every 4 (four) hours as needed for wheezing or shortness of breath.      Allergies:   Penicillins and Pregabalin   Social History   Tobacco Use  . Smoking status: Never Smoker  . Smokeless tobacco: Never Used  Substance Use Topics  . Alcohol use:  Yes    Comment: socially- 2 x year  . Drug use: No     Family Hx: The patient's family history includes CAD (age of onset: 66) in her father.  ROS:   Please see the history of present illness.    No history of melena All other systems reviewed and are negative.   Prior CV studies:   The following studies were reviewed today:   Labs/Other Tests and Data Reviewed:    EKG:  No ECG reviewed.  Recent Labs: 11/20/2018: ALT 27 11/22/2018: Magnesium 2.3 11/24/2018: BUN 10; Creatinine, Ser 0.52; Potassium 3.7; Sodium 141 11/26/2018: Hemoglobin 7.1; Platelets 160   Recent Lipid Panel Lab Results  Component Value Date/Time   CHOL 135 06/01/2018 03:45 AM   TRIG 150 (H) 06/01/2018 03:45 AM   HDL 49 06/01/2018 03:45 AM   CHOLHDL 2.8 06/01/2018 03:45 AM   LDLCALC 56 06/01/2018 03:45 AM    Wt Readings from Last 3 Encounters:  11/26/18 192 lb 10.9 oz (87.4 kg)  11/04/18 180 lb (81.6 kg)  07/13/18 188 lb (85.3 kg)     Objective:    Vital Signs:  BP 127/70   Pulse 94    VITAL SIGNS:  reviewed Pt is no acute distress during interview ASSESSMENT & PLAN:    CAD- LAD PCI Oct 2019  CABG- CABG x 3 11/21/2018 secondary to LAD ISR  Anemia- Fe  deficiency.  This was never worked up pre op secondary to Plavix Rx. I have asked her to get a CBC done, she would like her PCP to do this.  HTN- controlled   HLD- On high dose statin Rx  COVID-19 Education: The signs and symptoms of COVID-19 were discussed with the patient and how to seek care for testing (follow up with PCP or arrange E-visit).  The importance of social distancing was discussed today.  Time:   Today, I have spent 15 minutes with the patient with telehealth technology discussing the above problems.     Medication Adjustments/Labs and Tests Ordered: Current medicines are reviewed at length with the patient today.  Concerns regarding medicines are outlined above.   Tests Ordered: No orders of the defined types were placed in this encounter.   Medication Changes: No orders of the defined types were placed in this encounter.   Disposition:  Follow up See Dr Allyson Sabal when the office opens.  I have asked her to have a CBC done and let us know the results.  I'll ask Dr Allyson Sabal if she needs to remain on Plavix  Signed, Corine Shelter, PA-C  12/08/2018 2:19 PM    [facility]

## 2018-12-09 ENCOUNTER — Telehealth: Payer: Self-pay | Admitting: Cardiology

## 2018-12-09 DIAGNOSIS — D508 Other iron deficiency anemias: Secondary | ICD-10-CM

## 2018-12-09 NOTE — Telephone Encounter (Signed)
Received incoming call from Tiffany at [facility] on [address] Tiffany states pt told their office that she was instructed to have PCP office collect CBC. Informed her of note by Corine Shelter, PA-C that stated that pt wanted to have her PCP office collect labs and was advised to contact [facility] office with update on results so no order for CBC was placed. Per Tiffany, if pt is not scheduled for labs already, pt would need an appt. Tiffany spoke with Clinical Supervisor Thane Edu, RN. Order for CBC to be put in Epic and order requisition to be faxed to Tiffany at [phone].   Informed pt of the situation mentioned above and that PCP office needed [facility] office to place order for CBC and fax order requisition to PCP in order for pt to have labs collected there. Advised her to call PCP office for update if they have not contacted her by the end of the day. Pt verbalized understanding

## 2018-12-09 NOTE — Telephone Encounter (Signed)
New Message:   Pt says she need to talk to a nurse about some lab work she is supposed to have.

## 2018-12-09 NOTE — Telephone Encounter (Signed)
Spoke with pt who states she called her PCP [name], FNP office at [facility] on [address] and was told that in order for her to have CBC lab done at their office she would need order request from [facility] sent to their office. Reviewed the following telemedicine visit note from 5/7:   "Anemia- Fe deficiency.  This was never worked up pre op secondary to Plavix Rx. I have asked her to get a CBC done, she would like her PCP to do this."  "Disposition:  Follow up See Dr Allyson Sabal when the office opens.  I have asked her to have a CBC done and let us know the results.  I'll ask Dr Allyson Sabal if she needs to remain on Plavix"  Contacted PCP [name], FNP office at [facility] on [address] for clarification and spoke with personnel who states that this is lunch time at office but would take down message and contact number.

## 2018-12-13 ENCOUNTER — Telehealth: Payer: Self-pay | Admitting: Cardiovascular Disease

## 2018-12-13 NOTE — Telephone Encounter (Signed)
I do not need any labs drawn at home

## 2018-12-13 NOTE — Telephone Encounter (Signed)
[facility]- she states they need verbal orders of what labs Dr.Berry would like to draw for patient at home, so they don't have to go to the office since she just had surgery.  I advised Beth a message would be sent to Dr.Berry to advise.  Thank you!

## 2018-12-13 NOTE — Telephone Encounter (Signed)
New message:    Waynetta Sandy from [facility] calling concerning getting the patient labs at home. Patient had open heart surgery and what labs need to be done. [phone]

## 2018-12-21 NOTE — Addendum Note (Signed)
Encounter addended by: Robyne Peers, RN on: 12/21/2018 10:53 AM  Actions taken: Episode resolved

## 2018-12-27 ENCOUNTER — Other Ambulatory Visit: Payer: Self-pay | Admitting: Cardiology

## 2018-12-27 ENCOUNTER — Telehealth: Payer: Self-pay | Admitting: Cardiovascular Disease

## 2018-12-27 NOTE — Telephone Encounter (Signed)
Have her see a midlevel provider back in 2 to 3 weeks for evaluation

## 2018-12-27 NOTE — Telephone Encounter (Signed)
New message   STAT if HR is under 50 or over 120 (normal HR is 60-100 beats per minute)  1) What is your heart rate? 90-96  2) Do you have a log of your heart rate readings (document readings)? Yes   3) Do you have any other symptoms? Wheezing, sob with a lot of exertion   Beth is also requiring if have received lab results from Dr. Fortunato Curling office?

## 2018-12-27 NOTE — Telephone Encounter (Signed)
Spoke with Beth with [facility] and she called to report that the pt has been having increased HR 90-95 the past several weeks. She has been wheezing quite a bit. Her PMD has been treating her with breathing treatments a few times a week. Pt does not have any chest pain, dizziness, edema. She feels it is harder to breathe when it is humid outside. She says her BP has been staying at 120/60.   Pt has increased SOB with exertion. I have advised her to possibly contact [name] NP from her PCP office regarding the potential of the breathing treatments increasing her heart rate. Pt denies feeling palpitations or her heart beating hard. She says she was just calling to let us know. Will forward to Dr. Allyson Sabal for his review. Pt due for follow up 03/2019.

## 2018-12-28 ENCOUNTER — Other Ambulatory Visit: Payer: Self-pay

## 2018-12-28 ENCOUNTER — Ambulatory Visit: Payer: Medicare Other | Admitting: Surgery

## 2018-12-28 ENCOUNTER — Telehealth: Payer: Self-pay | Admitting: *Deleted

## 2018-12-28 ENCOUNTER — Other Ambulatory Visit: Payer: Self-pay | Admitting: Surgery

## 2018-12-28 DIAGNOSIS — Z951 Presence of aortocoronary bypass graft: Secondary | ICD-10-CM

## 2018-12-28 NOTE — Telephone Encounter (Signed)
Patient has had CABG.  At this point, I do not think there is a need to take Plavix.

## 2018-12-28 NOTE — Telephone Encounter (Signed)
Pharmacy is requesting refill for Plavix, per epic note 12/08/18, Monique [name], [facility] note stated that he is going to check with Dr. Allyson Sabal to see if patient is to continue taking medication. Please advise. Thank you

## 2018-12-29 ENCOUNTER — Ambulatory Visit (INDEPENDENT_AMBULATORY_CARE_PROVIDER_SITE_OTHER): Payer: Self-pay | Admitting: Surgery

## 2018-12-29 ENCOUNTER — Encounter: Payer: Self-pay | Admitting: Surgery

## 2018-12-29 ENCOUNTER — Ambulatory Visit
Admission: RE | Admit: 2018-12-29 | Discharge: 2018-12-29 | Disposition: A | Discharge status: Home / Self Care | Payer: Medicare Other | Source: Ambulatory Visit | Attending: Surgery | Admitting: Surgery

## 2018-12-29 VITALS — BP 124/70 | HR 100 | Temp 97.6°F | Resp 20 | Ht 65.0 in | Wt 179.0 lb

## 2018-12-29 DIAGNOSIS — Z951 Presence of aortocoronary bypass graft: Secondary | ICD-10-CM

## 2018-12-29 NOTE — Progress Notes (Signed)
HPI: Patient returns for routine postoperative follow-up having undergone coronary bypass graft surgery x3 on 11/21/2018. The patient's early postoperative recovery while in the hospital was notable for a slow but uncomplicated postoperative course. Since hospital discharge the patient reports he has been feeling fairly well.  She is walking daily and trying to increase the distance.  She still notes some shortness of breath at times which she believes is due to her asthma.  She has taken a breathing treatment which has helped..   Current Outpatient Medications  Medication Sig Dispense Refill   aspirin EC 81 MG tablet Take 81 mg by mouth daily.     atorvastatin (LIPITOR) 80 MG tablet Take 1 tablet (80 mg total) by mouth daily at 6 PM. 90 tablet 2   carvedilol (COREG) 6.25 MG tablet Take 1 tablet (6.25 mg total) by mouth 2 (two) times daily with a meal. 60 tablet 1   cetirizine (ZYRTEC) 10 MG tablet Take 10 mg by mouth at bedtime.      Cholecalciferol (VITAMIN D PO) Take 1,000 Units by mouth daily.      clopidogrel (PLAVIX) 75 MG tablet Take 1 tablet (75 mg total) by mouth daily with breakfast. 90 tablet 2   ferrous sulfate (FERROUSUL) 325 (65 FE) MG tablet Take 1 tablet (325 mg total) by mouth 2 (two) times daily with a meal. 180 tablet 2   furosemide (LASIX) 40 MG tablet Take 1 tablet (40 mg total) by mouth daily for 7 days. 7 tablet 0   lisinopril (PRINIVIL,ZESTRIL) 40 MG tablet Take 40 mg by mouth daily.     MAGNESIUM OXIDE PO Take 125 mg by mouth daily.      oxyCODONE (OXY IR/ROXICODONE) 5 MG immediate release tablet Take 1 tablet (5 mg total) by mouth every 6 (six) hours as needed for severe pain. 30 tablet 0   pantoprazole (PROTONIX) 40 MG tablet Take 1 tablet (40 mg total) by mouth 2 (two) times daily before a meal. 30 tablet 0   polyethylene glycol (MIRALAX / GLYCOLAX) packet Take 17 g by mouth 2 (two) times daily. (Patient taking differently: Take 17 g by mouth at  bedtime as needed for moderate constipation. ) 14 each 0   venlafaxine XR (EFFEXOR-XR) 75 MG 24 hr capsule Take 225 mg by mouth at bedtime.     VENTOLIN HFA 108 (90 Base) MCG/ACT inhaler Inhale 2 puffs into the lungs every 4 (four) hours as needed for wheezing or shortness of breath.      No current facility-administered medications for this visit.     Physical Exam: BP 124/70    Pulse 100    Temp 97.6 F (36.4 C) (Skin)    Resp 20    Ht 5\' 5"  (1.651 m)    Wt 179 lb (81.2 kg)    SpO2 95% Comment: RA   BMI 29.79 kg/m  She looks well. Cardiac exam shows a regular rate and rhythm with normal heart sounds. Lung exam is clear. The chest incision is healing well and the sternum is stable. The right leg incision is healing well and there is no peripheral edema.  Diagnostic Tests:  CLINICAL DATA:  Shortness of breath.  CABG in April.  EXAM: CHEST - 2 VIEW  COMPARISON:  Chest x-ray dated November 24, 2018.  FINDINGS: Stable cardiomediastinal silhouette status post CABG. Normal pulmonary vascularity. No focal consolidation, pleural effusion, or pneumothorax. Unchanged mild elevation of the right hemidiaphragm. No acute osseous abnormality.  IMPRESSION: No active cardiopulmonary disease.   Electronically Signed   By: Obie DredgeWilliam T Derry M.D.   On: 12/29/2018 10:16   Impression:  Overall I think she is making good progress following coronary bypass surgery.  I encouraged her to continue walking as much as possible.  She is somewhat limited by her arthritis.  I think she would benefit from cardiac rehab when that program reopens.  I told her that she can return to driving a car but should refrain lifting anything heavier than 10 pounds for 3 months postoperatively.    Plan:  She is going to follow-up with Dr. Allyson SabalBerry in a couple months and will return to see me if she has any problems with her incisions.   Alleen BorneBryan K Nuriyah Hanline, MD [facility] [phone]

## 2019-01-11 ENCOUNTER — Telehealth (HOSPITAL_COMMUNITY): Payer: Self-pay

## 2019-01-11 NOTE — Telephone Encounter (Signed)
         Confirm Consent - In the setting of the current Covid19 crisis, you are scheduled for a phone visit with your Cardiac or Pulmonary team member.  Just as we do with many in-gym visits, in order for you to participate in this visit, we must obtain consent.  If you'd like, I can send this to your mychart (if signed up) or email for you to review.  Otherwise, I can obtain your verbal consent now.  By agreeing to a telephone visit, we'd like you to understand that the technology does not allow for your Cardiac or Pulmonary Rehab team member to perform a physical assessment, and thus may limit their ability to fully assess your ability to perform exercise programs. If your provider identifies any concerns that need to be evaluated in person, we will make arrangements to do so.  Finally, though the technology is pretty good, we cannot assure that it will always work on either your or our end and we cannot ensure that we have a secure connection.  Cardiac and Pulmonary Rehab Telehealth visits and "At Home" cardiac and pulmonary rehab are provided at no cost to you.        Are you willing to proceed?"        STAFF: Did the patient verbally acknowledge consent to telehealth visit? Document YES/NO here: Yes     Deitra Mayo BS, ACSM CEP   Cardiac and Pulmonary Rehab Staff        01/11/2019   @  12:33 PM

## 2019-01-11 NOTE — Telephone Encounter (Signed)
Phone call to Pt to provide information about virtual CR. Pt was responsive and wanted to proceed with virtual CR. An appointment was set up for Pt to set up application.

## 2019-01-17 ENCOUNTER — Telehealth (HOSPITAL_COMMUNITY): Payer: Self-pay

## 2019-01-17 ENCOUNTER — Encounter (HOSPITAL_COMMUNITY)
Admission: RE | Admit: 2019-01-17 | Discharge: 2019-01-17 | Disposition: A | Discharge status: Home / Self Care | Payer: Medicare Other | Source: Ambulatory Visit | Attending: Cardiovascular Disease | Admitting: Cardiovascular Disease

## 2019-01-17 NOTE — Telephone Encounter (Signed)
Called and spoke to pt regarding Virtual Cardiac Rehab.  Pt  was able to download the [facility] app on their smart device with no issues. Pt set up their account and received the following welcome message -"Welcome to the [place] and Pulmonary Rehabilitation program. We hope that you will find the exercise program beneficial in your recovery process. Our staff is available to assist with any questions/concerns about your exercise routine. Best wishes". Brief orientation provided to with the advisement to watch the "Intro to Rehab" series located under the Resource tab. Pt verbalized understanding. Will continue to follow and monitor pt progress with feedback as needed.  Carma Lair MS, ACSM CEP 1:34 PM 01/17/2019

## 2019-01-19 ENCOUNTER — Telehealth (HOSPITAL_COMMUNITY): Payer: Self-pay

## 2019-01-19 NOTE — Telephone Encounter (Signed)
Phone call to Virtual Cardiac Rehab pt for nutrition education and counseling for heart healthy diet. Left voicemail with contact information requesting pt call RD back.   Laurina Bustle, MS, RD, LDN 01/19/2019 1:30 PM

## 2019-01-20 ENCOUNTER — Telehealth (HOSPITAL_COMMUNITY): Payer: Self-pay | Admitting: *Deleted

## 2019-01-20 ENCOUNTER — Telehealth: Payer: Self-pay

## 2019-01-20 NOTE — Telephone Encounter (Signed)

## 2019-01-20 NOTE — Telephone Encounter (Signed)
appt scheduled 6/23 with Fabian Sharp, PA at 3:30pm. Pt verbalized understanding

## 2019-01-20 NOTE — Telephone Encounter (Signed)
.   Monique [name] documented on the virtual cardiac rehab APP that she has been experiencing some chronic knee pain during walking.Monique [name] says that she has arthritis and has been taking Tylenol as needed. Monique [name] says that she has been walking for 30 minutes at a tiime. Advised Monique [name] to decrease her walking time to shorter distances and to follow up with her primary care provider and or her orthopedist. Patient states understanding.Barnet Pall, RN,BSN 01/20/2019 12:13 PM

## 2019-01-23 ENCOUNTER — Telehealth: Payer: Self-pay | Admitting: Physician Assistant

## 2019-01-23 NOTE — Telephone Encounter (Signed)
smartphone/ consent/ my chart active/ pre reg completed °

## 2019-01-24 ENCOUNTER — Encounter: Payer: Self-pay | Admitting: Physician Assistant

## 2019-01-24 ENCOUNTER — Telehealth (INDEPENDENT_AMBULATORY_CARE_PROVIDER_SITE_OTHER): Payer: Medicare Other | Admitting: Physician Assistant

## 2019-01-24 VITALS — Ht 65.0 in | Wt 178.0 lb

## 2019-01-24 DIAGNOSIS — I2510 Atherosclerotic heart disease of native coronary artery without angina pectoris: Secondary | ICD-10-CM | POA: Diagnosis not present

## 2019-01-24 DIAGNOSIS — E785 Hyperlipidemia, unspecified: Secondary | ICD-10-CM

## 2019-01-24 DIAGNOSIS — Z951 Presence of aortocoronary bypass graft: Secondary | ICD-10-CM | POA: Diagnosis not present

## 2019-01-24 DIAGNOSIS — I10 Essential (primary) hypertension: Secondary | ICD-10-CM | POA: Diagnosis not present

## 2019-01-24 DIAGNOSIS — Z9861 Coronary angioplasty status: Secondary | ICD-10-CM

## 2019-01-24 NOTE — Progress Notes (Signed)
Virtual Visit via Video Note   This visit type was conducted due to national recommendations for restrictions regarding the COVID-19 Pandemic (e.g. social distancing) in an effort to limit this patient's exposure and mitigate transmission in our community.  Due to her co-morbid illnesses, this patient is at least at moderate risk for complications without adequate follow up.  This format is felt to be most appropriate for this patient at this time.  All issues noted in this document were discussed and addressed.  A limited physical exam was performed with this format.  Please refer to the patient's chart for her consent to telehealth for [facility].   Date:  01/24/2019   ID:  Monique KluverAnn T Glasser, DOB 04/21/1941, MRN [id number]  Patient Location: Home Provider Location: Office  PCP:  Elizabeth PalauAnderson, Teresa, FNP  Cardiologist:  Nanetta BattyJonathan Berry, MD  Electrophysiologist:  None   Evaluation Performed:  Follow-Up Visit  Chief Complaint:  Follow up CABG  History of Present Illness:    Monique [name] is a 78 y.o. female with a history of coronary artery disease with DES to her LAD 05/2018.  Residual disease with 80% ramus treated medically.  She had a repeat heart catheterization 11/17/2018 with her ostial to proximal LAD 95% in-stent restenosis, high-grade disease in a ramus branch and high first marginal branch.  RCA is disease free.  She has accelerated angina and TCTS was consulted for CABG.  Cardiogram at that time showed reserved EF of mild concentric left ventricular hypertrophy, diastolic dysfunction and no significant valvular disease.  She underwent bypass grafting on 11/21/2018.  LIMA to LAD, SVG to ramus, SVG to OM.  She did well in the postoperative period and was discharged on 11/27/2018.  She was seen in follow-up by Corine ShelterLuke Kilroy, PAC on 12/08/18.  Her hemoglobin recovered to 10.7 12/20/2018.  Overall she was doing well at that visit.  According to phone notes, her Plavix was stopped by Dr. Allyson SabalBerry  since she was revascularized.  She presents today for follow-up.  She states her arthritis is difficult, but she is progressing. She states she feels better every week and is working with cardiac rehab. Her pressure today is 149/75 with HR 100.  Weight is 178 lbs. Generally she is 100-110 systolic. She thinks her pressure is high due to anxiety for her visit.    The patient does not have symptoms concerning for COVID-19 infection (fever, chills, cough, or new shortness of breath).    Past Medical History:  Diagnosis Date   Anemia     mild as per PCP   Arthritis    hip/ s/p recent shoulder fracture 8/12- RIGHT   Asthma    Coronary artery disease 05/30/2018   LAD PCI/DES   GI bleeding    Hiatal hernia    Humerus fracture    with wrist on right side   Hypertension    hypercholesterolemia/  EKG on chart with clearance and note 06/18/11  Mazz[place]chi   Pneumonia    Past Surgical History:  Procedure Laterality Date   ABDOMINAL HYSTERECTOMY     COLECTOMY WITH COLOSTOMY CREATION/HARTMANN PROCEDURE N/A 05/07/2016   Procedure: COLOSTOMY CREATION/HARTMANN PROCEDURE;  Surgeon: Romie LeveeAlicia Thomas, MD;  Location: [facility];  Service: General;  Laterality: N/A;   COLONOSCOPY N/A 08/26/2016   Procedure: COLONOSCOPY;  Surgeon: Romie LeveeAlicia Thomas, MD;  Location: [facility];  Service: Endoscopy;  Laterality: N/A;   COLOSTOMY TAKEDOWN N/A 08/27/2016   Procedure: LAPAROSCOPIC COLOSTOMY REVERSAL;  Surgeon: Romie LeveeAlicia Thomas, MD;  Location: [facility];  Service: General;  Laterality: N/A;   CORONARY ARTERY BYPASS GRAFT N/A 11/21/2018   Procedure: CORONARY ARTERY BYPASS GRAFTING (CABG) x Three , using left internal mammary artery and right leg greater saphenous vein harvested endoscopically;  Surgeon: Alleen BorneBartle, Bryan K, MD;  Location: [facility];  Service: Open Heart Surgery;  Laterality: N/A;   CORONARY STENT INTERVENTION N/A 05/30/2018   Procedure: CORONARY STENT INTERVENTION;  Surgeon: Runell GessBerry, Jonathan J, MD;   Location: [facility];  Service: Cardiovascular;  Laterality: N/A;   EYE SURGERY     eyelid tuck bilateral   LAPAROTOMY N/A 05/07/2016   Procedure: EXPLORATORY LAPAROTOMY, LYLSIS OF ADHSIONS, Pam DrownEVACATION OF PERITONEAL ABSCESS;  Surgeon: Romie LeveeAlicia Thomas, MD;  Location: [facility];  Service: General;  Laterality: N/A;   LEFT HEART CATH AND CORONARY ANGIOGRAPHY N/A 05/30/2018   Procedure: LEFT HEART CATH AND CORONARY ANGIOGRAPHY;  Surgeon: Runell GessBerry, Jonathan J, MD;  [facility]: [facility];  Service: Cardiovascular;  Laterality: N/A;   LEFT HEART CATH AND CORONARY ANGIOGRAPHY N/A 11/17/2018   Procedure: LEFT HEART CATH AND CORONARY ANGIOGRAPHY;  Surgeon: Runell GessBerry, Jonathan J, MD;  [facility]: [facility];  Service: Cardiovascular;  Laterality: N/A;   PARATHYROIDECTOMY     one lobe- benign per pt   TEE WITHOUT CARDIOVERSION N/A 11/21/2018   Procedure: TRANSESOPHAGEAL ECHOCARDIOGRAM (TEE);  Surgeon: Alleen BorneBartle, Bryan K, MD;  [facility];  Service: Open Heart Surgery;  Laterality: N/A;   TONSILLECTOMY     TOTAL HIP ARTHROPLASTY  07/08/2011   Procedure: TOTAL HIP ARTHROPLASTY;  Surgeon: Gus RankinFrank V Aluisio;  Location: [facility];  Service: Orthopedics;  Laterality: Right;     Current Meds  Medication Sig   aspirin EC 81 MG tablet Take 81 mg by mouth daily.   atorvastatin (LIPITOR) 80 MG tablet Take 1 tablet (80 mg total) by mouth daily at 6 PM.   carvedilol (COREG) 3.125 MG tablet Take 3.125 mg by mouth 2 (two) times daily with a meal.   cetirizine (ZYRTEC) 10 MG tablet Take 10 mg by mouth at bedtime.    Cholecalciferol (VITAMIN D PO) Take 1,000 Units by mouth daily.    clopidogrel (PLAVIX) 75 MG tablet Take 75 mg by mouth daily.   lisinopril (PRINIVIL,ZESTRIL) 40 MG tablet Take 40 mg by mouth daily.   MAGNESIUM OXIDE PO Take 125 mg by mouth daily.    oxyCODONE (OXY IR/ROXICODONE) 5 MG immediate release tablet Take 1 tablet (5 mg total) by mouth every 6 (six) hours as needed for severe  pain.   pantoprazole (PROTONIX) 40 MG tablet Take 1 tablet (40 mg total) by mouth 2 (two) times daily before a meal.   polyethylene glycol (MIRALAX / GLYCOLAX) packet Take 17 g by mouth 2 (two) times daily. (Patient taking differently: Take 17 g by mouth at bedtime as needed for moderate constipation. )   Prenatal MV-Min-Fe Fum-FA-DHA (PRENATAL MULTIVITAMIN + DHA) 28-0.8 & 200 MG MISC Take 1 tablet by mouth daily.   venlafaxine XR (EFFEXOR-XR) 75 MG 24 hr capsule Take 225 mg by mouth at bedtime.   VENTOLIN HFA 108 (90 Base) MCG/ACT inhaler Inhale 2 puffs into the lungs every 4 (four) hours as needed for wheezing or shortness of breath.    vitamin B-12 (CYANOCOBALAMIN) 1000 MCG tablet Take 1,000 mcg by mouth daily.   [DISCONTINUED] carvedilol (COREG) 6.25 MG tablet Take 1 tablet (6.25 mg total) by mouth 2 (two) times daily with a meal.     Allergies:  Penicillins and Pregabalin   Social History   Tobacco Use   Smoking status: Never Smoker   Smokeless tobacco: Never Used  Substance Use Topics   Alcohol use: Yes    Comment: socially- 2 x year   Drug use: No     Family Hx: The patient's family history includes CAD (age of onset: 50) in her father.  ROS:   Please see the history of present illness.     All other systems reviewed and are negative.   Prior CV studies:   The following studies were reviewed today:  CABG x 3: 11/21/2018 Procedure: 1. Median Sternotomy 2. Extracorporeal circulation 3. Coronary artery bypass grafting x 3   Left internal mammary graft to the LAD  SVG to Ramus  SVG to OM  4. Endoscopic vein harvest from the rightleg  Cardiac catheterization 11/17/2018   Ost LAD to Prox LAD lesion is 95% stenosed.  Lat Ramus lesion is 90% stenosed.  Ramus lesion is 90% stenosed.  Echocardiogram 11/17/2018 1. The left ventricle has hyperdynamic systolic function, with an ejection fraction of >65%. The cavity size was normal. There is  mild concentric left ventricular hypertrophy. Left ventricular diastolic Doppler parameters are consistent with impaired  relaxation. No evidence of left ventricular regional wall motion abnormalities. 2. The right ventricle has normal systolic function. The cavity was normal. There is no increase in right ventricular wall thickness. 3. The aortic valve is tricuspid. Mild sclerosis of the aortic valve. Aortic valve regurgitation was not assessed by color flow Doppler. 4. The interatrial septum appears to be lipomatous.   Labs/Other Tests and Data Reviewed:    EKG:  An ECG dated 11/22/18 was personally reviewed today and demonstrated:  sinus rhythm, ST changes that are similar to prior  Recent Labs: 11/20/2018: ALT 27 11/22/2018: Magnesium 2.3 11/24/2018: BUN 10; Creatinine, Ser 0.52; Potassium 3.7; Sodium 141 11/26/2018: Hemoglobin 7.1; Platelets 160   Recent Lipid Panel Lab Results  Component Value Date/Time   CHOL 135 06/01/2018 03:45 AM   TRIG 150 (H) 06/01/2018 03:45 AM   HDL 49 06/01/2018 03:45 AM   CHOLHDL 2.8 06/01/2018 03:45 AM   LDLCALC 56 06/01/2018 03:45 AM    Wt Readings from Last 3 Encounters:  01/24/19 178 lb (80.7 kg)  12/29/18 179 lb (81.2 kg)  11/26/18 192 lb 10.9 oz (87.4 kg)     Objective:    Vital Signs:  Ht 5\' 5"  (1.651 m)    Wt 178 lb (80.7 kg)    BMI 29.62 kg/m    VITAL SIGNS:  reviewed GEN:  no acute distress EYES:  sclerae anicteric, EOMI - Extraocular Movements Intact RESPIRATORY:  normal respiratory effort, symmetric expansion CARDIOVASCULAR:  no peripheral edema SKIN:  no rash, lesions or ulcers. MUSCULOSKELETAL:  no obvious deformities. NEURO:  alert and oriented x 3, no obvious focal deficit PSYCH:  normal affect  ASSESSMENT & PLAN:    CAD s/p DES to LAD with subsequent in-stent restenosis, s/p CABG x 3 11/2018 Per Dr. Gwenlyn Found, no further plavix needed. She is taking 81 mg ASA, coreg 3.125 mg BID, and 40 mg lisinopril. DOE is improving. She  is walking daily - cardiac rehab is virtual now.   Anemia Post-operative anemia. Hb recovered to 10.7 on 12/20/18. Being followed by PCP, will follow up after her beach trip.    Hyperlipidemia with LDL goal of < 70 06/01/2018: Cholesterol 135; HDL 49; LDL Cholesterol 56; Triglycerides 150; VLDL 30 Continue 80 mg lipitor.  Hypertension Pressures generally well-controlled. No medication changes.    COVID-19 Education: The signs and symptoms of COVID-19 were discussed with the patient and how to seek care for testing (follow up with PCP or arrange E-visit).  The importance of social distancing was discussed today.  Time:   Today, I have spent 23 minutes with the patient with telehealth technology discussing the above problems.     Medication Adjustments/Labs and Tests Ordered: Current medicines are reviewed at length with the patient today.  Concerns regarding medicines are outlined above.   Tests Ordered: No orders of the defined types were placed in this encounter.   Medication Changes: No orders of the defined types were placed in this encounter.   Follow Up:  Virtual Visit or In Person in 3 month(s)  -already has an appt with Dr. Allyson SabalBerry  Signed, Roe Rutherfordngela Nicole [place], [place]  01/24/2019 3:58 PM    [facility]

## 2019-02-25 ENCOUNTER — Other Ambulatory Visit: Payer: Self-pay | Admitting: Cardiology

## 2019-03-10 ENCOUNTER — Other Ambulatory Visit: Payer: Self-pay

## 2019-03-10 ENCOUNTER — Ambulatory Visit (INDEPENDENT_AMBULATORY_CARE_PROVIDER_SITE_OTHER): Payer: Medicare Other | Admitting: Cardiovascular Disease

## 2019-03-10 ENCOUNTER — Encounter: Payer: Self-pay | Admitting: Cardiovascular Disease

## 2019-03-10 VITALS — BP 122/64 | HR 87 | Temp 97.9°F | Ht 66.0 in | Wt 185.0 lb

## 2019-03-10 DIAGNOSIS — E785 Hyperlipidemia, unspecified: Secondary | ICD-10-CM | POA: Diagnosis not present

## 2019-03-10 DIAGNOSIS — I208 Other forms of angina pectoris: Secondary | ICD-10-CM | POA: Diagnosis not present

## 2019-03-10 DIAGNOSIS — I2510 Atherosclerotic heart disease of native coronary artery without angina pectoris: Secondary | ICD-10-CM

## 2019-03-10 DIAGNOSIS — Z9861 Coronary angioplasty status: Secondary | ICD-10-CM

## 2019-03-10 MED ORDER — CARVEDILOL 6.25 MG PO TABS: 6.2500 mg | ORAL_TABLET | Freq: Two times a day (BID) | ORAL | 3 refills | Status: DC

## 2019-03-10 NOTE — Assessment & Plan Note (Signed)
History of dyslipidemia on high-dose atorvastatin with lipid profile performed 06/01/2018 revealing a total cholesterol 135, LDL 56 and HDL of 49.  We will should recheck a lipid liver profile

## 2019-03-10 NOTE — Progress Notes (Signed)
03/10/2019 [name]   July 02, 1941  [id number]  Primary Physician Vicenta Aly, [place] Primary Cardiologist: Lorretta Harp MD Lupe Carney, [place]  HPI:  Monique [name] is a 78 y.o.  moderately overweight widowed Caucasian female mother of 2, grandmother and 2 grandchildren's husband Kennith Center was a long-term patient of mine who died 08/17/16. 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 Shewasreferred by Vicenta Aly nurse practitioner for a symptomatic sinus tachycardia.I last saw her in the office  11/15/2018.Marland Kitchen Hercardiac risk factors include treated hypertension and hyperlipidemia patient is a family history of heart disease with father who died of a myocardial function and age 37. She has never had a heart attack or stroke. She denies chest pain or shortness of breath. She does exercise and does yoga as well. She has reactive airways disease. She is noted to be tachycardic by her PCP was referred here for further evaluation.  When I saw her in the office 05/27/2018 she was complaining of increasing dyspnea and exertional chest pain. I performed radial diagnostic cath 05/22/2018 revealing high-grade calcified proximal LAD stenosis. I stented her with a 2.5 mm x 20 mm long Synergy drug-eluting stent however there was a small area that would not completely expand which left her with a fairly focal 50% stenosis within the dilated segment. She also had an 80% small ramus branch stenosis which was untreated. Her angina has resolved her dyspnea has improved. She was significantly anemic with a hemoglobin of 8.1 during her hospitalization which is improved with iron repletion up to the low 9 range.  She had done well until earlier this month when she started developing crescendo angina.  She was seen by Dr. Margaretann Loveless  on 11/04/2018 who adjusted her medications and increased her long-acting oral nitrate.  She continues to have effort angina which is fairly reproducible  as well as some unstable symptoms at night.  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  She underwent CABG x3 by Dr. Cyndia Bent 11/21/2018 with a LIMA to the LAD, vein to an obtuse marginal branch and ramus branch.  Her postop course was uncomplicated.  She is recuperating nicely and is participating virtual cardiac rehab.  Current Meds  Medication Sig  . aspirin EC 81 MG tablet Take 81 mg by mouth daily.  Marland Kitchen atorvastatin (LIPITOR) 80 MG tablet Take 1 tablet (80 mg total) by mouth daily at 6 PM.  . carvedilol (COREG) 3.125 MG tablet Take 3.125 mg by mouth 2 (two) times daily with a meal.  . cetirizine (ZYRTEC) 10 MG tablet Take 10 mg by mouth at bedtime.   . Cholecalciferol (VITAMIN D PO) Take 1,000 Units by mouth daily.   . clopidogrel (PLAVIX) 75 MG tablet Take 75 mg by mouth daily.  Marland Kitchen lisinopril (PRINIVIL,ZESTRIL) 40 MG tablet Take 40 mg by mouth daily.  Marland Kitchen MAGNESIUM OXIDE PO Take 125 mg by mouth daily.   Marland Kitchen oxyCODONE (OXY IR/ROXICODONE) 5 MG immediate release tablet Take 1 tablet (5 mg total) by mouth every 6 (six) hours as needed for severe pain.  . pantoprazole (PROTONIX) 40 MG tablet Take 1 tablet (40 mg total) by mouth 2 (two) times daily before a meal.  . polyethylene glycol (MIRALAX / GLYCOLAX) packet Take 17 g by mouth 2 (two) times daily. (Patient taking differently: Take 17 g by mouth at bedtime as needed for moderate constipation. )  . Prenatal MV-Min-Fe Fum-FA-DHA (  PRENATAL MULTIVITAMIN + DHA) 28-0.8 & 200 MG MISC Take 1 tablet by mouth daily.  Marland Kitchen. venlafaxine XR (EFFEXOR-XR) 75 MG 24 hr capsule Take 225 mg by mouth at bedtime.  . VENTOLIN HFA 108 (90 Base) MCG/ACT inhaler Inhale 2 puffs into the lungs every 4 (four) hours as needed for wheezing or shortness of breath.   . vitamin B-12 (CYANOCOBALAMIN) 1000 MCG tablet Take 1,000 mcg by mouth daily.     Allergies  Allergen Reactions  . Penicillins  Other (See Comments)    UNSPECIFIED REACTION  Unknown reaction as a child.  Tolerated Zosyn 2017.  . Pregabalin Anxiety    Social History   Socioeconomic History  . Marital status: Widowed    Spouse name: Not on file  . Number of children: Not on file  . Years of education: Not on file  . Highest education level: Not on file  Occupational History  . Not on file  Social Needs  . Financial resource strain: Not on file  . Food insecurity    Worry: Not on file    Inability: Not on file  . Transportation needs    Medical: Not on file    Non-medical: Not on file  Tobacco Use  . Smoking status: Never Smoker  . Smokeless tobacco: Never Used  Substance and Sexual Activity  . Alcohol use: Yes    Comment: socially- 2 x year  . Drug use: No  . Sexual activity: Not on file  Lifestyle  . Physical activity    Days per week: Not on file    Minutes per session: Not on file  . Stress: Not on file  Relationships  . Social Musicianconnections    Talks on phone: Not on file    Gets together: Not on file    Attends religious service: Not on file    Active member of club or organization: Not on file    Attends meetings of clubs or organizations: Not on file    Relationship status: Not on file  . Intimate partner violence    Fear of current or ex partner: Not on file    Emotionally abused: Not on file    Physically abused: Not on file    Forced sexual activity: Not on file  Other Topics Concern  . Not on file  Social History Narrative  . Not on file     Review of Systems: General: negative for chills, fever, night sweats or weight changes.  Cardiovascular: negative for chest pain, dyspnea on exertion, edema, orthopnea, palpitations, paroxysmal nocturnal dyspnea or shortness of breath Dermatological: negative for rash Respiratory: negative for cough or wheezing Urologic: negative for hematuria Abdominal: negative for nausea, vomiting, diarrhea, bright red blood per rectum, melena, or  hematemesis Neurologic: negative for visual changes, syncope, or dizziness All other systems reviewed and are otherwise negative except as noted above.    Blood pressure 122/64, pulse 87, temperature 97.9 F (36.6 C), height 5\' 6"  (1.676 m), weight 185 lb (83.9 kg).  General appearance: alert and no distress Neck: no adenopathy, no carotid bruit, no JVD, supple, symmetrical, trachea midline and thyroid not enlarged, symmetric, no tenderness/mass/nodules Lungs: clear to auscultation bilaterally Heart: regular rate and rhythm, S1, S2 normal, no murmur, click, rub or gallop Extremities: extremities normal, atraumatic, no cyanosis or edema Pulses: 2+ and symmetric Skin: Skin color, texture, turgor normal. No rashes or lesions Neurologic: Alert and oriented X 3, normal strength and tone. Normal symmetric reflexes. Normal coordination and gait  EKG sinus rhythm at 87 with nonspecific ST-T wave changes.  The previously noted T wave inversion in her April EKG has since resolved.  Personally reviewed this EKG.  ASSESSMENT AND PLAN:   Essential hypertension History of essential hypertension her blood pressure measured at 122/64.  She is on carvedilol and lisinopril.  Dyslipidemia, goal LDL below 70 History of dyslipidemia on high-dose atorvastatin with lipid profile performed 06/01/2018 revealing a total cholesterol 135, LDL 56 and HDL of 49.  We will should recheck a lipid liver profile  CAD S/P percutaneous coronary angioplasty History of CAD status post proximal LAD stenting using a Synergy drug-eluting stent with mild residual disease as result of calcified vessel.  Because of progressive angina she underwent repeat angiography 11/17/2018 revealing aggressive in-stent restenosis which ultimately led to CABG x3 by Dr. Laneta SimmersBartle 11/21/2018 with a LIMA to the LAD, vein to a ramus branch and obtuse marginal branch.  Her postop course was uncomplicated.  She is recuperating nicely.  She is participating  in virtual cardiac rehab.  She denies chest pain or shortness of breath.      Runell GessJonathan J. Dorsie Burich MD FACP,FACC,FAHA, [facility] 03/10/2019 4:08 PM

## 2019-03-10 NOTE — Assessment & Plan Note (Signed)
History of CAD status post proximal LAD stenting using a Synergy drug-eluting stent with mild residual disease as result of calcified vessel.  Because of progressive angina she underwent repeat angiography 11/17/2018 revealing aggressive in-stent restenosis which ultimately led to CABG x3 by Dr. Cyndia Bent 11/21/2018 with a LIMA to the LAD, vein to a ramus branch and obtuse marginal branch.  Her postop course was uncomplicated.  She is recuperating nicely.  She is participating in virtual cardiac rehab.  She denies chest pain or shortness of breath.

## 2019-03-10 NOTE — Patient Instructions (Signed)
Medication Instructions:  Your physician has recommended you make the following change in your medication:  INCREASE YOUR CARVEDILOL TO 6.25 MG BY MOUTH TWICE A DAY  If you need a refill on your cardiac medications before your next appointment, please call your pharmacy.   Lab work: Your physician recommends that you return for lab work WITHIN 1 WEEK: FASTING LIPID AND LIVER FUNCTION TEST  If you have labs (blood work) drawn today and your tests are completely normal, you will receive your results only by: Marland Kitchen MyChart Message (if you have MyChart) OR . A paper copy in the mail If you have any lab test that is abnormal or we need to change your treatment, we will call you to review the results.  Testing/Procedures: NONE  Follow-Up: At [facility], you and your health needs are our priority.  As part of our continuing mission to provide you with exceptional heart care, we have created designated Provider Care Teams.  These Care Teams include your primary Cardiologist (physician) and Advanced Practice Providers (APPs -  Physician Assistants and Nurse Practitioners) who all work together to provide you with the care you need, when you need it. You will need a follow up appointment in 6 months [place], PA-C AND [address].  Please call our office 2 months in advance to schedule this appointment.

## 2019-03-10 NOTE — Assessment & Plan Note (Signed)
History of essential hypertension her blood pressure measured at 122/64.  She is on carvedilol and lisinopril.

## 2019-03-28 ENCOUNTER — Telehealth (HOSPITAL_COMMUNITY): Payer: Self-pay | Admitting: *Deleted

## 2019-03-28 NOTE — Telephone Encounter (Signed)
Patient participates in the virtual cardiac rehab program via the [employer] app and logged some SOB with exercise. Left voicemail message for patient to call regarding symptoms.  Sol Passer, MS, ACSM CEP 03/28/2019 1214

## 2019-08-03 ENCOUNTER — Other Ambulatory Visit: Payer: Self-pay

## 2019-08-03 ENCOUNTER — Encounter: Payer: Self-pay | Admitting: Cardiology

## 2019-08-03 ENCOUNTER — Ambulatory Visit (INDEPENDENT_AMBULATORY_CARE_PROVIDER_SITE_OTHER): Payer: Medicare Other | Admitting: Cardiology

## 2019-08-03 VITALS — BP 171/87 | HR 99 | Temp 97.7°F | Ht 66.0 in | Wt 185.6 lb

## 2019-08-03 DIAGNOSIS — Z9861 Coronary angioplasty status: Secondary | ICD-10-CM

## 2019-08-03 DIAGNOSIS — I208 Other forms of angina pectoris: Secondary | ICD-10-CM | POA: Diagnosis not present

## 2019-08-03 DIAGNOSIS — R5383 Other fatigue: Secondary | ICD-10-CM | POA: Diagnosis not present

## 2019-08-03 DIAGNOSIS — E785 Hyperlipidemia, unspecified: Secondary | ICD-10-CM | POA: Diagnosis not present

## 2019-08-03 DIAGNOSIS — Z951 Presence of aortocoronary bypass graft: Secondary | ICD-10-CM | POA: Diagnosis not present

## 2019-08-03 DIAGNOSIS — I2510 Atherosclerotic heart disease of native coronary artery without angina pectoris: Secondary | ICD-10-CM | POA: Diagnosis not present

## 2019-08-03 DIAGNOSIS — I10 Essential (primary) hypertension: Secondary | ICD-10-CM

## 2019-08-03 NOTE — Patient Instructions (Signed)
Medication Instructions:  No changes *If you need a refill on your cardiac medications before your next appointment, please call your pharmacy*  Lab Work: Your physician recommends that you have labs drawn today.   If you have labs (blood work) drawn today and your tests are completely normal, you will receive your results only by: Marland Kitchen MyChart Message (if you have MyChart) OR . A paper copy in the mail If you have any lab test that is abnormal or we need to change your treatment, we will call you to review the results.  Testing/Procedures: No changes   Follow-Up: At [facility], you and your health needs are our priority.  As part of our continuing mission to provide you with exceptional heart care, we have created designated Provider Care Teams.  These Care Teams include your primary Cardiologist (physician) and Advanced Practice Providers (APPs -  Physician Assistants and Nurse Practitioners) who all work together to provide you with the care you need, when you need it.  Your next appointment:   6-8  week(s)  The format for your next appointment:   Either In Person or Virtual  Provider:   Quay Burow, MD  Other Instructions

## 2019-08-03 NOTE — Progress Notes (Signed)
Cardiology Office Note:    Date:  08/03/2019   ID:  Monique [name], DOB 02/25/1941, MRN [id number]  PCP:  Elizabeth PalauAnderson, Teresa, FNP  Cardiologist:  Nanetta BattyJonathan Berry, MD  Electrophysiologist:  None   Referring MD: Elizabeth PalauAnderson, Teresa, FNP   Chief Complaint  Patient presents with  . Follow-up  C/O fatigue for past month and increased depression  History of Present Illness:    Monique [name] is a 78 y.o. female with a hx of coronary disease.  She had an LAD PCI with DES in October 2019.  She had a residual 80% ramus branch.  The LAD was a small vessel and there was a 50% residual narrowing after PCI.  She developed recurrent chest pain consistent with angina in April 2020.  Catheterization revealed a 95% stenosis at the previously placed LAD stent, and high-grade ramus intermedius and high-grade OM stenosis.  She ultimately underwent bypass grafting x3 on 11/21/2018 with an LIMA to LAD, SVG to RI, and SVG to OM.  Her ejection fraction was greater than 65%.  She was discharged 11/24/2018. She has done well since from a cardiac standpoint.  She di have a history of anemia but that apparently resolved as her Hgb in Aug 2020 was 14.   She is in the office today with complaints of new fatigue for the past month.  She also notes increased depression.  Her antidepressants have been adjusted.  She denies chest pain.    Past Medical History:  Diagnosis Date  . Anemia     mild as per PCP  . Arthritis    hip/ s/p recent shoulder fracture 8/12- RIGHT  . Asthma   . Coronary artery disease 05/30/2018   LAD PCI/DES  . GI bleeding   . Hiatal hernia   . Humerus fracture    with wrist on right side  . Hypertension    hypercholesterolemia/  EKG on chart with clearance and note 06/18/11  Mazzocchi  . Pneumonia     Past Surgical History:  Procedure Laterality Date  . ABDOMINAL HYSTERECTOMY    . COLECTOMY WITH COLOSTOMY CREATION/HARTMANN PROCEDURE N/A 05/07/2016   Procedure: COLOSTOMY CREATION/HARTMANN  PROCEDURE;  Surgeon: Romie LeveeAlicia Thomas, MD;  Location: [facility];  Service: General;  Laterality: N/A;  . COLONOSCOPY N/A 08/26/2016   Procedure: COLONOSCOPY;  Surgeon: Romie LeveeAlicia Thomas, MD;  Location: [facility];  Service: Endoscopy;  Laterality: N/A;  . COLOSTOMY TAKEDOWN N/A 08/27/2016   Procedure: LAPAROSCOPIC COLOSTOMY REVERSAL;  Surgeon: Romie LeveeAlicia Thomas, MD;  Location: [facility];  Service: General;  Laterality: N/A;  . CORONARY ARTERY BYPASS GRAFT N/A 11/21/2018   Procedure: CORONARY ARTERY BYPASS GRAFTING (CABG) x Three , using left internal mammary artery and right leg greater saphenous vein harvested endoscopically;  Surgeon: Alleen BorneBartle, Bryan K, MD;  Location: [facility];  Service: Open Heart Surgery;  Laterality: N/A;  . CORONARY STENT INTERVENTION N/A 05/30/2018   Procedure: CORONARY STENT INTERVENTION;  Surgeon: Runell GessBerry, Jonathan J, MD;  Location: [facility];  Service: Cardiovascular;  Laterality: N/A;  . EYE SURGERY     eyelid tuck bilateral  . LAPAROTOMY N/A 05/07/2016   Procedure: EXPLORATORY LAPAROTOMY, LYLSIS OF ADHSIONS, EVACATION OF PERITONEAL ABSCESS;  Surgeon: Romie LeveeAlicia Thomas, MD;  Location: [facility];  Service: General;  Laterality: N/A;  . LEFT HEART CATH AND CORONARY ANGIOGRAPHY N/A 05/30/2018   Procedure: LEFT HEART CATH AND CORONARY ANGIOGRAPHY;  Surgeon: Runell GessBerry, Jonathan J, MD;  Location: [facility];  Service: Cardiovascular;  Laterality: N/A;  .  LEFT HEART CATH AND CORONARY ANGIOGRAPHY N/A 11/17/2018   Procedure: LEFT HEART CATH AND CORONARY ANGIOGRAPHY;  Surgeon: Runell Gess, MD;  [facility]: [facility];  Service: Cardiovascular;  Laterality: N/A;  . PARATHYROIDECTOMY     one lobe- benign per pt  . TEE WITHOUT CARDIOVERSION N/A 11/21/2018   Procedure: TRANSESOPHAGEAL ECHOCARDIOGRAM (TEE);  Surgeon: Alleen Borne, MD;  Location: [facility] OR;  Service: Open Heart Surgery;  Laterality: N/A;  . TONSILLECTOMY    . TOTAL HIP ARTHROPLASTY  07/08/2011   Procedure: TOTAL HIP  ARTHROPLASTY;  Surgeon: Gus Rankin Aluisio;  Location: [facility];  Service: Orthopedics;  Laterality: Right;    Current Medications: Current Meds  Medication Sig  . aspirin EC 81 MG tablet Take 81 mg by mouth daily.  Marland Kitchen atorvastatin (LIPITOR) 80 MG tablet Take 1 tablet (80 mg total) by mouth daily at 6 PM.  . carvedilol (COREG) 6.25 MG tablet Take 1 tablet (6.25 mg total) by mouth 2 (two) times daily with a meal.  . cetirizine (ZYRTEC) 10 MG tablet Take 10 mg by mouth at bedtime.   . Cholecalciferol (VITAMIN D PO) Take 1,000 Units by mouth daily.   . clopidogrel (PLAVIX) 75 MG tablet Take 75 mg by mouth daily.  . DULoxetine (CYMBALTA) 30 MG capsule Take 30 mg by mouth.  . DULoxetine (CYMBALTA) 60 MG capsule Take 60 mg by mouth daily.  Marland Kitchen lisinopril (PRINIVIL,ZESTRIL) 40 MG tablet Take 40 mg by mouth daily.  Marland Kitchen MAGNESIUM OXIDE PO Take 125 mg by mouth daily.   Marland Kitchen oxyCODONE (OXY IR/ROXICODONE) 5 MG immediate release tablet Take 1 tablet (5 mg total) by mouth every 6 (six) hours as needed for severe pain.  . pantoprazole (PROTONIX) 40 MG tablet Take 1 tablet (40 mg total) by mouth 2 (two) times daily before a meal.  . polyethylene glycol (MIRALAX / GLYCOLAX) packet Take 17 g by mouth 2 (two) times daily. (Patient taking differently: Take 17 g by mouth at bedtime as needed for moderate constipation. )  . Prenatal MV-Min-Fe Fum-FA-DHA (PRENATAL MULTIVITAMIN + DHA) 28-0.8 & 200 MG MISC Take 1 tablet by mouth daily.  . VENTOLIN HFA 108 (90 Base) MCG/ACT inhaler Inhale 2 puffs into the lungs every 4 (four) hours as needed for wheezing or shortness of breath.   . vitamin B-12 (CYANOCOBALAMIN) 1000 MCG tablet Take 1,000 mcg by mouth daily.     Allergies:   Penicillins and Pregabalin   Social History   Socioeconomic History  . Marital status: Widowed    Spouse name: Not on file  . Number of children: Not on file  . Years of education: Not on file  . Highest education level: Not on file  Occupational  History  . Not on file  Tobacco Use  . Smoking status: Never Smoker  . Smokeless tobacco: Never Used  Substance and Sexual Activity  . Alcohol use: Yes    Comment: socially- 2 x year  . Drug use: No  . Sexual activity: Not on file  Other Topics Concern  . Not on file  Social History Narrative  . Not on file   Social Determinants of Health   Financial Resource Strain:   . Difficulty of Paying Living Expenses: Not on file  Food Insecurity:   . Worried About Programme researcher, broadcasting/film/video in the Last Year: Not on file  . Ran Out of Food in the Last Year: Not on file  Transportation Needs:   . Lack of  Transportation (Medical): Not on file  . Lack of Transportation (Non-Medical): Not on file  Physical Activity:   . Days of Exercise per Week: Not on file  . Minutes of Exercise per Session: Not on file  Stress:   . Feeling of Stress : Not on file  Social Connections:   . Frequency of Communication with Friends and Family: Not on file  . Frequency of Social Gatherings with Friends and Family: Not on file  . Attends Religious Services: Not on file  . Active Member of Clubs or Organizations: Not on file  . Attends Archivist Meetings: Not on file  . Marital Status: Not on file     Family History: The patient's family history includes CAD (age of onset: 5) in her father.  ROS:   Please see the history of present illness.     All other systems reviewed and are negative.  EKGs/Labs/Other Studies Reviewed:    The following studies were reviewed today:   EKG:  EKG is ordered today.  The ekg ordered today demonstrates NSR- TWI V2- old.   Recent Labs: 11/20/2018: ALT 27 11/22/2018: Magnesium 2.3 11/24/2018: BUN 10; Creatinine, Ser 0.52; Potassium 3.7; Sodium 141 11/26/2018: Hemoglobin 7.1; Platelets 160  Recent Lipid Panel    Component Value Date/Time   CHOL 135 06/01/2018 0345   TRIG 150 (H) 06/01/2018 0345   HDL 49 06/01/2018 0345   CHOLHDL 2.8 06/01/2018 0345   VLDL 30  06/01/2018 0345   LDLCALC 56 06/01/2018 0345    Physical Exam:    VS:  BP (!) 171/87   Pulse 99   Temp 97.7 F (36.5 C)   Ht 5\' 6"  (1.676 m)   Wt 185 lb 9.6 oz (84.2 kg)   SpO2 97%   BMI 29.96 kg/m     Wt Readings from Last 3 Encounters:  08/03/19 185 lb 9.6 oz (84.2 kg)  03/10/19 185 lb (83.9 kg)  01/24/19 178 lb (80.7 kg)     GEN:  Well nourished, well developed in no acute distress HEENT: Normal NECK: No JVD; No carotid bruits CARDIAC: RRR, no murmurs, rubs, gallops RESPIRATORY:  Clear to auscultation without rales, wheezing or rhonchi  ABDOMEN: Soft, non-tender, non-distended MUSCULOSKELETAL:  No edema; No deformity  SKIN: Warm and dry NEUROLOGIC:  Alert and oriented x 3 PSYCHIATRIC:  Normal affect   ASSESSMENT:    Fatigue- Does not sound like angina  CABG- CABG x 3 11/21/2018 secondary to LAD ISR  Anemia- Hgb in Aug 2020 was 14- check CBC to make sure this hasn't changed.   HTN- Same rx for now  HLD- On high dose statin Rx  Chronic pain- DJD- followed by Dr Primus Bravo at pain clinic   PLAN:    Check EKG and labs today- CBC, BMP, TSH. She asked if was OK from a cardiac standpoint to adjust her pain medications through Dr Primus Bravo.  I told  Her there was no contraindication from a cardiac standpoint.  F/U Dr Gwenlyn Found 6-8 weeks.    Medication Adjustments/Labs and Tests Ordered: Current medicines are reviewed at length with the patient today.  Concerns regarding medicines are outlined above.  No orders of the defined types were placed in this encounter.  No orders of the defined types were placed in this encounter.   There are no Patient Instructions on file for this visit.   Signed, Kerin Ransom, PA-C  08/03/2019 2:33 PM    [facility]

## 2019-08-04 LAB — BASIC METABOLIC PANEL
BUN/Creatinine Ratio: 32 — ABNORMAL HIGH (ref 12–28)
BUN: 21 mg/dL (ref 8–27)
CO2: 23 mmol/L (ref 20–29)
Calcium: 9.4 mg/dL (ref 8.7–10.3)
Chloride: 103 mmol/L (ref 96–106)
Creatinine, Ser: 0.66 mg/dL (ref 0.57–1.00)
GFR calc Af Amer: 97 mL/min/{1.73_m2} (ref >59)
GFR calc non Af Amer: 84 mL/min/{1.73_m2} (ref >59)
Glucose: 87 mg/dL (ref 65–99)
Potassium: 4.6 mmol/L (ref 3.5–5.2)
Sodium: 141 mmol/L (ref 134–144)

## 2019-08-04 LAB — CBC
Hematocrit: 40 % (ref 34.0–46.6)
Hemoglobin: 13.6 g/dL (ref 11.1–15.9)
MCH: 31.9 pg (ref 26.6–33.0)
MCHC: 34 g/dL (ref 31.5–35.7)
MCV: 94 fL (ref 79–97)
Platelets: 167 10*3/uL (ref 150–450)
RBC: 4.27 x10E6/uL (ref 3.77–5.28)
RDW: 12.2 % (ref 11.7–15.4)
WBC: 5.9 10*3/uL (ref 3.4–10.8)

## 2019-08-04 LAB — TSH: TSH: 2.83 u[IU]/mL (ref 0.450–4.500)

## 2019-08-07 ENCOUNTER — Encounter (HOSPITAL_COMMUNITY): Payer: Self-pay | Admitting: *Deleted

## 2019-08-07 NOTE — Progress Notes (Signed)
Patient has completed the virtual cardiac rehab program and will continue exercise independently at this time. Patient will continue to exercise at [facility] walking and riding recumbent bike 37 minutes, and resistance training using bands and 3 lb weights for 20 minutes. Patient's virtual cardiac rehab report will be scanned to media for review.  Artist Pais, MS, ACSM CEP

## 2019-08-08 ENCOUNTER — Other Ambulatory Visit (INDEPENDENT_AMBULATORY_CARE_PROVIDER_SITE_OTHER): Payer: Medicare Other

## 2019-08-08 DIAGNOSIS — I2510 Atherosclerotic heart disease of native coronary artery without angina pectoris: Secondary | ICD-10-CM

## 2019-08-08 DIAGNOSIS — Z951 Presence of aortocoronary bypass graft: Secondary | ICD-10-CM

## 2019-08-08 DIAGNOSIS — R5383 Other fatigue: Secondary | ICD-10-CM

## 2019-08-08 DIAGNOSIS — Z9861 Coronary angioplasty status: Secondary | ICD-10-CM

## 2019-08-08 DIAGNOSIS — E785 Hyperlipidemia, unspecified: Secondary | ICD-10-CM

## 2019-08-08 DIAGNOSIS — I10 Essential (primary) hypertension: Secondary | ICD-10-CM

## 2019-08-20 ENCOUNTER — Other Ambulatory Visit: Payer: Self-pay | Admitting: Cardiology

## 2019-08-22 NOTE — Telephone Encounter (Signed)
Rx(s) sent to pharmacy electronically.  

## 2019-08-24 ENCOUNTER — Other Ambulatory Visit: Payer: Self-pay | Admitting: Cardiovascular Disease

## 2019-09-14 ENCOUNTER — Ambulatory Visit: Payer: Medicare Other | Admitting: Cardiology

## 2019-09-19 ENCOUNTER — Ambulatory Visit: Payer: Medicare Other | Admitting: Cardiovascular Disease

## 2019-10-04 ENCOUNTER — Ambulatory Visit (INDEPENDENT_AMBULATORY_CARE_PROVIDER_SITE_OTHER): Payer: Medicare Other | Admitting: Cardiovascular Disease

## 2019-10-04 ENCOUNTER — Encounter: Payer: Self-pay | Admitting: Cardiovascular Disease

## 2019-10-04 ENCOUNTER — Other Ambulatory Visit: Payer: Self-pay

## 2019-10-04 DIAGNOSIS — Z9861 Coronary angioplasty status: Secondary | ICD-10-CM

## 2019-10-04 DIAGNOSIS — I2510 Atherosclerotic heart disease of native coronary artery without angina pectoris: Secondary | ICD-10-CM | POA: Diagnosis not present

## 2019-10-04 DIAGNOSIS — E785 Hyperlipidemia, unspecified: Secondary | ICD-10-CM

## 2019-10-04 DIAGNOSIS — I10 Essential (primary) hypertension: Secondary | ICD-10-CM | POA: Diagnosis not present

## 2019-10-04 NOTE — Assessment & Plan Note (Signed)
History of CAD status post proximal LAD stenting by myself 05/22/2018 with a synergy 2.5 mm x 20 mm long drug-eluting stent.  There was a small area that was "noncompliant and would not yield a high high-pressure balloon dilatation.  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 Laneta Simmers 11/21/2018 with a LIMA to her LAD, vein to the obtuse marginal branch and the ramus branch.  Her course was uncomplicated.  Her chest pain resolved.  She did participate in cardiac rehab.

## 2019-10-04 NOTE — Progress Notes (Signed)
10/04/2019 Monique [name]   10/15/1940  [id number]  Primary Physician [name], FNP Primary Cardiologist: Runell Gess MD Nicholes Calamity, [place]  HPI:  Monique [name] is a 79 y.o.  moderately overweight widowed Caucasian female mother of 2, grandmother and 2 grandchildren's husband [name] was a long-term patient of mine who died 09/03/2016. 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 Shewasreferred by [name] nurse practitioner for a symptomatic sinus tachycardia.I last saw her in the office  03/10/2019.Marland Kitchen Hercardiac risk factors include treated hypertension and hyperlipidemia patient is a family history of heart disease with father who died of a myocardial function and age 69. She has never had a heart attack or stroke. She denies chest pain or shortness of breath. She does exercise and does yoga as well. She has reactive airways disease. She is noted to be tachycardic by her PCP was referred here for further evaluation.  When I saw her in the office10/25/2019 she was complaining of increasing dyspnea and exertional chest pain. I performed radial diagnostic cath 05/22/2018 revealing high-grade calcified proximal LAD stenosis. I stented her with a 2.5 mm x 20 mm long Synergy drug-eluting stent however there was a small area that would not completely expand which left her with a fairly focal 50% stenosis within the dilated segment. She also had an 80% small ramus branch stenosis which was untreated. Her angina has resolved her dyspnea has improved. She was significantly anemic with a hemoglobin of 8.1 during her hospitalization which is improved with iron repletion up to the low 9 range.  She had done well until earlier this month when she started developing crescendo angina. She was seen by Dr. Jacques Navy  on 11/04/2018 who adjusted her medications and increased her long-acting oral nitrate. She continues to have effort angina which is fairly reproducible  as well as some unstable symptoms at night.  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  She underwent CABG x3 by Dr. Laneta Simmers 11/21/2018 with a LIMA to the LAD, vein to an obtuse marginal branch and ramus branch.  Her postop course was uncomplicated.  She  recuperated nicely and  and participated in the cardiac rehab program.  Since I saw her in the office 7 months ago she continues to do well.  She was somewhat depressed when she saw Corine Shelter in the office this past December but has now become involved with [employer] athlet[employer] which she is excited about.  Current Meds  Medication Sig  . aspirin EC 81 MG tablet Take 81 mg by mouth daily.  Marland Kitchen atorvastatin (LIPITOR) 80 MG tablet TAKE 1 TABLET BY MOUTH AT DAILY 6PM  . carvedilol (COREG) 6.25 MG tablet TAKE 1 TABLET(6.25 MG) BY MOUTH TWICE DAILY WITH A MEAL  . cetirizine (ZYRTEC) 10 MG tablet Take 10 mg by mouth at bedtime.   . Cholecalciferol (VITAMIN D PO) Take 1,000 Units by mouth daily.   . clopidogrel (PLAVIX) 75 MG tablet Take 75 mg by mouth daily. On hold  . DULoxetine (CYMBALTA) 30 MG capsule Take 30 mg by mouth.  . DULoxetine (CYMBALTA) 60 MG capsule Take 60 mg by mouth daily.  Marland Kitchen lisinopril (PRINIVIL,ZESTRIL) 40 MG tablet Take 40 mg by mouth daily.  Marland Kitchen MAGNESIUM OXIDE PO Take 125 mg by mouth daily.   Marland Kitchen oxyCODONE-acetaminophen (PERCOCET/ROXICET) 5-325 MG tablet Take 1 tablet by mouth.  . pantoprazole (PROTONIX) 40 MG tablet  Take 1 tablet (40 mg total) by mouth 2 (two) times daily before a meal.  . polyethylene glycol (MIRALAX / GLYCOLAX) packet Take 17 g by mouth 2 (two) times daily. (Patient taking differently: Take 17 g by mouth at bedtime as needed for moderate constipation. )  . Prenatal MV-Min-Fe Fum-FA-DHA (PRENATAL MULTIVITAMIN + DHA) 28-0.8 & 200 MG MISC Take 1 tablet by mouth daily.  . RESTASIS 0.05 % ophthalmic emulsion SMARTSIG:1  Drop(s) In Eye(s) Every 12 Hours  . VENTOLIN HFA 108 (90 Base) MCG/ACT inhaler Inhale 2 puffs into the lungs every 4 (four) hours as needed for wheezing or shortness of breath.   . vitamin B-12 (CYANOCOBALAMIN) 1000 MCG tablet Take 1,000 mcg by mouth daily.     Allergies  Allergen Reactions  . Penicillins Other (See Comments)    UNSPECIFIED REACTION  Unknown reaction as a child.  Tolerated Zosyn 2017.  . Pregabalin Anxiety    Social History   Socioeconomic History  . Marital status: Widowed    Spouse name: Not on file  . Number of children: Not on file  . Years of education: Not on file  . Highest education level: Not on file  Occupational History  . Not on file  Tobacco Use  . Smoking status: Never Smoker  . Smokeless tobacco: Never Used  Substance and Sexual Activity  . Alcohol use: Yes    Comment: socially- 2 x year  . Drug use: No  . Sexual activity: Not on file  Other Topics Concern  . Not on file  Social History Narrative  . Not on file   Social Determinants of Health   Financial Resource Strain:   . Difficulty of Paying Living Expenses: Not on file  Food Insecurity:   . Worried About Programme researcher, broadcasting/film/video in the Last Year: Not on file  . Ran Out of Food in the Last Year: Not on file  Transportation Needs:   . Lack of Transportation (Medical): Not on file  . Lack of Transportation (Non-Medical): Not on file  Physical Activity:   . Days of Exercise per Week: Not on file  . Minutes of Exercise per Session: Not on file  Stress:   . Feeling of Stress : Not on file  Social Connections:   . Frequency of Communication with Friends and Family: Not on file  . Frequency of Social Gatherings with Friends and Family: Not on file  . Attends Religious Services: Not on file  . Active Member of Clubs or Organizations: Not on file  . Attends Banker Meetings: Not on file  . Marital Status: Not on file  Intimate Partner Violence:   . Fear of Current or  Ex-Partner: Not on file  . Emotionally Abused: Not on file  . Physically Abused: Not on file  . Sexually Abused: Not on file     Review of Systems: General: negative for chills, fever, night sweats or weight changes.  Cardiovascular: negative for chest pain, dyspnea on exertion, edema, orthopnea, palpitations, paroxysmal nocturnal dyspnea or shortness of breath Dermatological: negative for rash Respiratory: negative for cough or wheezing Urologic: negative for hematuria Abdominal: negative for nausea, vomiting, diarrhea, bright red blood per rectum, melena, or hematemesis Neurologic: negative for visual changes, syncope, or dizziness All other systems reviewed and are otherwise negative except as noted above.    Blood pressure (!) 146/78, pulse 99, height 5\' 5"  (1.651 m), weight 187 lb (84.8 kg), SpO2 95 %.  General appearance: alert and  no distress Neck: no adenopathy, no carotid bruit, no JVD, supple, symmetrical, trachea midline and thyroid not enlarged, symmetric, no tenderness/mass/nodules Lungs: clear to auscultation bilaterally Heart: regular rate and rhythm, S1, S2 normal, no murmur, click, rub or gallop Extremities: extremities normal, atraumatic, no cyanosis or edema Pulses: 2+ and symmetric Skin: Skin color, texture, turgor normal. No rashes or lesions Neurologic: Alert and oriented X 3, normal strength and tone. Normal symmetric reflexes. Normal coordination and gait  EKG not performed today  ASSESSMENT AND PLAN:   Essential hypertension History of essential hypertension with blood pressure measured today 146/78.  She is on carvedilol and lisinopril.  Dyslipidemia, goal LDL below 70 History of dyslipidemia on statin therapy.  We will recheck a lipid liver profile.  CAD S/P percutaneous coronary angioplasty History of CAD status post proximal LAD stenting by myself 05/22/2018 with a synergy 2.5 mm x 20 mm long drug-eluting stent.  There was a small area that was  "noncompliant and would not yield a high high-pressure balloon dilatation.  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 Cyndia Bent 11/21/2018 with a LIMA to her LAD, vein to the obtuse marginal branch and the ramus branch.  Her course was uncomplicated.  Her chest pain resolved.  She did participate in cardiac rehab.      Lorretta Harp MD FACP,FACC,FAHA, [facility] 10/04/2019 2:47 PM

## 2019-10-04 NOTE — Assessment & Plan Note (Signed)
History of dyslipidemia on statin therapy.  We will recheck a lipid liver profile. 

## 2019-10-04 NOTE — Assessment & Plan Note (Signed)
History of essential hypertension with blood pressure measured today 146/78.  She is on carvedilol and lisinopril.

## 2019-10-04 NOTE — Patient Instructions (Signed)
Medication Instructions:  Your physician recommends that you continue on your current medications as directed. Please refer to the Current Medication list given to you today.  If you need a refill on your cardiac medications before your next appointment, please call your pharmacy.   Lab work: Fasting Lipids and Hepatic Function If you have labs (blood work) drawn today and your tests are completely normal, you will receive your results only by: MyChart Message (if you have MyChart) OR A paper copy in the mail If you have any lab test that is abnormal or we need to change your treatment, we will call you to review the results.  Testing/Procedures: NONE  Follow-Up: At [facility], you and your health needs are our priority.  As part of our continuing mission to provide you with exceptional heart care, we have created designated Provider Care Teams.  These Care Teams include your primary Cardiologist (physician) and Advanced Practice Providers (APPs -  Physician Assistants and Nurse Practitioners) who all work together to provide you with the care you need, when you need it. You may see [name], MD or one of the following Advanced Practice Providers on your designated Care Team:    Corine Shelter, PA-C  [place], [place]  Edd Fabian, [place]  Your physician wants you to follow-up in: 6 months with Corine Shelter, PA-C and 1 year with Dr. Allyson Sabal. You will receive a reminder letter in the mail two months in advance. If you don't receive a letter, please call our office to schedule the follow-up appointment.

## 2019-10-21 LAB — HEPATIC FUNCTION PANEL
ALT: 33 IU/L — ABNORMAL HIGH (ref 0–32)
AST: 29 IU/L (ref 0–40)
Albumin: 4.6 g/dL (ref 3.7–4.7)
Alkaline Phosphatase: 86 IU/L (ref 39–117)
Bilirubin Total: 1 mg/dL (ref 0.0–1.2)
Bilirubin, Direct: 0.22 mg/dL (ref 0.00–0.40)
Total Protein: 6.6 g/dL (ref 6.0–8.5)

## 2019-10-21 LAB — LIPID PANEL
Chol/HDL Ratio: 2.7 ratio (ref 0.0–4.4)
Cholesterol, Total: 150 mg/dL (ref 100–199)
HDL: 55 mg/dL (ref >39)
LDL Chol Calc (NIH): 59 mg/dL (ref 0–99)
Triglycerides: 227 mg/dL — ABNORMAL HIGH (ref 0–149)
VLDL Cholesterol Cal: 36 mg/dL (ref 5–40)

## 2019-11-19 ENCOUNTER — Other Ambulatory Visit: Payer: Self-pay | Admitting: Physician Assistant

## 2019-12-06 ENCOUNTER — Other Ambulatory Visit: Payer: Self-pay

## 2019-12-07 ENCOUNTER — Other Ambulatory Visit: Payer: Self-pay

## 2019-12-12 ENCOUNTER — Other Ambulatory Visit: Payer: Self-pay

## 2019-12-12 MED ORDER — CLOPIDOGREL BISULFATE 75 MG PO TABS: 75.0000 mg | ORAL_TABLET | Freq: Every day | ORAL | 1 refills | Status: DC

## 2020-02-12 ENCOUNTER — Telehealth: Payer: Self-pay | Admitting: Cardiovascular Disease

## 2020-02-12 NOTE — Telephone Encounter (Signed)
LVM for patient to return call to get f/u appt scheduled from patients recall lists 

## 2020-04-10 ENCOUNTER — Encounter: Payer: Self-pay | Admitting: Physician Assistant

## 2020-04-10 ENCOUNTER — Other Ambulatory Visit: Payer: Self-pay

## 2020-04-10 ENCOUNTER — Ambulatory Visit (INDEPENDENT_AMBULATORY_CARE_PROVIDER_SITE_OTHER): Payer: Medicare Other | Admitting: Physician Assistant

## 2020-04-10 VITALS — BP 135/82 | HR 84 | Temp 98.2°F | Ht 65.0 in | Wt 185.0 lb

## 2020-04-10 DIAGNOSIS — I2510 Atherosclerotic heart disease of native coronary artery without angina pectoris: Secondary | ICD-10-CM | POA: Diagnosis not present

## 2020-04-10 DIAGNOSIS — E785 Hyperlipidemia, unspecified: Secondary | ICD-10-CM | POA: Diagnosis not present

## 2020-04-10 DIAGNOSIS — I10 Essential (primary) hypertension: Secondary | ICD-10-CM | POA: Diagnosis not present

## 2020-04-10 DIAGNOSIS — I770 Arteriovenous fistula, acquired: Secondary | ICD-10-CM | POA: Diagnosis not present

## 2020-04-10 DIAGNOSIS — I25810 Atherosclerosis of coronary artery bypass graft(s) without angina pectoris: Secondary | ICD-10-CM | POA: Diagnosis not present

## 2020-04-10 DIAGNOSIS — F32A Depression, unspecified: Secondary | ICD-10-CM

## 2020-04-10 DIAGNOSIS — F329 Major depressive disorder, single episode, unspecified: Secondary | ICD-10-CM

## 2020-04-10 NOTE — Patient Instructions (Addendum)
Medication Instructions:  Your physician recommends that you continue on your current medications as directed. Please refer to the Current Medication list given to you today.  *If you need a refill on your cardiac medications before your next appointment, please call your pharmacy*  Lab Work: Your physician recommends that you return for lab work in 6 MONTHS prior to your follow up appointment:   CMET  Fasting Lipid Panel-DO NOT EAT OR DRINK PAST MIDNIGHT. OKAY TO HAVE WATER. If you have labs (blood work) drawn today and your tests are completely normal, you will receive your results only by: Marland Kitchen MyChart Message (if you have MyChart) OR . A paper copy in the mail If you have any lab test that is abnormal or we need to change your treatment, we will call you to review the results.  Testing/Procedures: NONE ordered at this time of appointment   Follow-Up: At [facility], you and your health needs are our priority.  As part of our continuing mission to provide you with exceptional heart care, we have created designated Provider Care Teams.  These Care Teams include your primary Cardiologist (physician) and Advanced Practice Providers (APPs -  Physician Assistants and Nurse Practitioners) who all work together to provide you with the care you need, when you need it.  Your next appointment:   6 month(s)  The format for your next appointment:   In Person  Provider:   [name], MD  Other Instructions

## 2020-04-10 NOTE — Progress Notes (Signed)
Cardiology Office Note:    Date:  04/10/2020   ID:  Monique [name], DOB Jul 10, 1941, MRN [id number]  PCP:  Monique Palau, FNP  [facility] Cardiologist:  Monique Batty, MD  [facility] Electrophysiologist:  None   Referring MD: Monique Palau, FNP   Chief Complaint  Patient presents with  . Follow-up    seen for Dr. Allyson [name]    History of Present Illness:    Monique [name] is a 79 y.o. female with a hx of hypertension, hyperlipidemia, CAD s/p CABG and history of asymptomatic sinus tachycardia.  She has strong family history of CAD with her father died of MI at age 44.  She underwent diagnostic cardiac catheterization on 05/30/2018 which revealed high-grade calcified proximal LAD lesion treated with 2.5 x 20 mm Synergy DES, 80% small ramus lesion and 40% ostial OM1 were treated medically.  She was also anemic with hemoglobin of 8.1 at the time.  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  She eventually underwent CABG x3 by Dr. Laneta Simmers on 11/21/2018 with limited-LAD, SVG-OM1, and SVG-ramus.  Postop course was uncomplicated.  She was most recently seen by Dr. Allyson [name] on 10/04/2019 at which time she was doing well physically however was somewhat depressed.  She presents today for follow-up.  She continues to have some degree of depression however said she was released by her psychiatrist.  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  She is also working with [employer] as well.  On exam, it was incidentally noted she has a bruit in her right radial side.  This is likely a small AV fistula as result of the previous cardiac catheterization.  She had one episode of chest pain in July and another episode of chest pain in August.  Both resolved with nitroglycerin.  At the characteristic of the chest pain was  atypical in that they started in the right side of the abdomen then radiating to the chest.  Each episodes of chest pain only last about 5 to 10 minutes.  However in between the 2 episodes of chest pain, she was able to exercise without any issue.  Since the last episode of chest pain over a month ago, she has been able to ride stationary bicycle for 30 minutes at a time without exertional symptoms.  Her chest pain was quite atypical and it does not warrant any further work-up unless recurrence.  Her blood pressure and heart rate is all very well controlled.  Past Medical History:  Diagnosis Date  . Anemia     mild as per PCP  . Arthritis    hip/ s/p recent shoulder fracture 8/12- RIGHT  . Asthma   . Coronary artery disease 05/30/2018   LAD PCI/DES  . GI bleeding   . Hiatal hernia   . Humerus fracture    with wrist on right side  . Hypertension    hypercholesterolemia/  EKG on chart with clearance and note 06/18/11  Mazzocchi  . Pneumonia     Past Surgical History:  Procedure Laterality Date  . ABDOMINAL HYSTERECTOMY    . COLECTOMY WITH COLOSTOMY CREATION/HARTMANN PROCEDURE N/A 05/07/2016   Procedure: COLOSTOMY CREATION/HARTMANN PROCEDURE;  Surgeon: Monique Levee, MD;  Location: [facility];  Service: General;  Laterality: N/A;  . COLONOSCOPY N/A 08/26/2016   Procedure: COLONOSCOPY;  Surgeon: Monique Levee, MD;  Location: [facility];  Service: Endoscopy;  Laterality: N/A;  . COLOSTOMY TAKEDOWN N/A 08/27/2016   Procedure: LAPAROSCOPIC COLOSTOMY REVERSAL;  Surgeon: Monique Levee, MD;  Location: [facility];  Service: General;  Laterality: N/A;  . CORONARY ARTERY BYPASS GRAFT N/A 11/21/2018   Procedure: CORONARY ARTERY BYPASS GRAFTING (CABG) x Three , using left internal mammary artery and right leg greater saphenous vein harvested endoscopically;  Surgeon: Alleen Borne, MD;  Location: [facility] OR;  Service: Open Heart Surgery;  Laterality: N/A;  . CORONARY STENT INTERVENTION N/A 05/30/2018    Procedure: CORONARY STENT INTERVENTION;  Surgeon: Monique Gess, MD;  Location: [facility];  Service: Cardiovascular;  Laterality: N/A;  . EYE SURGERY     eyelid tuck bilateral  . LAPAROTOMY N/A 05/07/2016   Procedure: EXPLORATORY LAPAROTOMY, LYLSIS OF ADHSIONS, EVACATION OF PERITONEAL ABSCESS;  Surgeon: Monique Levee, MD;  Location: [facility];  Service: General;  Laterality: N/A;  . LEFT HEART CATH AND CORONARY ANGIOGRAPHY N/A 05/30/2018   Procedure: LEFT HEART CATH AND CORONARY ANGIOGRAPHY;  Surgeon: Monique Gess, MD;  Location: [facility];  Service: Cardiovascular;  Laterality: N/A;  . LEFT HEART CATH AND CORONARY ANGIOGRAPHY N/A 11/17/2018   Procedure: LEFT HEART CATH AND CORONARY ANGIOGRAPHY;  Surgeon: Monique Gess, MD;  Location: [facility];  Service: Cardiovascular;  Laterality: N/A;  . PARATHYROIDECTOMY     one lobe- benign per pt  . TEE WITHOUT CARDIOVERSION N/A 11/21/2018   Procedure: TRANSESOPHAGEAL ECHOCARDIOGRAM (TEE);  Surgeon: Alleen Borne, MD;  Location: [facility];  Service: Open Heart Surgery;  Laterality: N/A;  . TONSILLECTOMY    . TOTAL HIP ARTHROPLASTY  07/08/2011   Procedure: TOTAL HIP ARTHROPLASTY;  Surgeon: Monique [name];  Location: [facility];  Service: Orthopedics;  Laterality: Right;    Current Medications: Current Meds  Medication Sig  . aspirin EC 81 MG tablet Take 81 mg by mouth daily.  Marland Kitchen atorvastatin (LIPITOR) 80 MG tablet TAKE 1 TABLET BY MOUTH AT DAILY 6PM  . carvedilol (COREG) 6.25 MG tablet TAKE 1 TABLET(6.25 MG) BY MOUTH TWICE DAILY WITH A MEAL  . cetirizine (ZYRTEC) 10 MG tablet Take 10 mg by mouth at bedtime.   . Cholecalciferol (VITAMIN D PO) Take 1,000 Units by mouth daily.   . clopidogrel (PLAVIX) 75 MG tablet Take 1 tablet (75 mg total) by mouth daily. On hold  . DULoxetine (CYMBALTA) 30 MG capsule Take 30 mg by mouth.  . DULoxetine (CYMBALTA) 60 MG capsule Take 60 mg by mouth daily.  Marland Kitchen lisinopril (PRINIVIL,ZESTRIL) 40  MG tablet Take 40 mg by mouth daily.  Marland Kitchen MAGNESIUM OXIDE PO Take 125 mg by mouth daily.   Marland Kitchen oxyCODONE-acetaminophen (PERCOCET/ROXICET) 5-325 MG tablet Take 1 tablet by mouth.  . pantoprazole (PROTONIX) 40 MG tablet Take 1 tablet (40 mg total) by mouth 2 (two) times daily before a meal.  . polyethylene glycol (MIRALAX / GLYCOLAX) packet Take 17 g by mouth 2 (two) times daily. (Patient taking differently: Take 17 g by mouth at bedtime as needed for moderate constipation. )  . Prenatal MV-Min-Fe Fum-FA-DHA (PRENATAL MULTIVITAMIN + DHA) 28-0.8 & 200 MG MISC Take 1 tablet by mouth daily.  . RESTASIS 0.05 % ophthalmic emulsion SMARTSIG:1 Drop(s) In Eye(s) Every 12 Hours  . VENTOLIN HFA 108 (90 Base) MCG/ACT inhaler Inhale 2 puffs into the lungs every 4 (four) hours as needed for wheezing or shortness of breath.   . vitamin B-12 (CYANOCOBALAMIN) 1000 MCG tablet Take 1,000 mcg  by mouth daily.     Allergies:   Penicillins and Pregabalin   Social History   Socioeconomic History  . Marital status: Widowed    Spouse name: Not on file  . Number of children: Not on file  . Years of education: Not on file  . Highest education level: Not on file  Occupational History  . Not on file  Tobacco Use  . Smoking status: Never Smoker  . Smokeless tobacco: Never Used  Vaping Use  . Vaping Use: Never used  Substance and Sexual Activity  . Alcohol use: Yes    Comment: socially- 2 x year  . Drug use: No  . Sexual activity: Not on file  Other Topics Concern  . Not on file  Social History Narrative  . Not on file   Social Determinants of Health   Financial Resource Strain:   . Difficulty of Paying Living Expenses: Not on file  Food Insecurity:   . Worried About Programme researcher, broadcasting/film/video in the Last Year: Not on file  . Ran Out of Food in the Last Year: Not on file  Transportation Needs:   . Lack of Transportation (Medical): Not on file  . Lack of Transportation (Non-Medical): Not on file  Physical  Activity:   . Days of Exercise per Week: Not on file  . Minutes of Exercise per Session: Not on file  Stress:   . Feeling of Stress : Not on file  Social Connections:   . Frequency of Communication with Friends and Family: Not on file  . Frequency of Social Gatherings with Friends and Family: Not on file  . Attends Religious Services: Not on file  . Active Member of Clubs or Organizations: Not on file  . Attends Banker Meetings: Not on file  . Marital Status: Not on file     Family History: The patient's family history includes CAD (age of onset: 38) in her father.  ROS:   Please see the history of present illness.     All other systems reviewed and are negative.  EKGs/Labs/Other Studies Reviewed:    The following studies were reviewed today:  Echo 11/17/2018 1. The left ventricle has hyperdynamic systolic function, with an  ejection fraction of >65%. The cavity size was normal. There is mild  concentric left ventricular hypertrophy. Left ventricular diastolic  Doppler parameters are consistent with impaired  relaxation. No evidence of left ventricular regional wall motion  abnormalities.  2. The right ventricle has normal systolic function. The cavity was  normal. There is no increase in right ventricular wall thickness.  3. The aortic valve is tricuspid. Mild sclerosis of the aortic valve.  Aortic valve regurgitation was not assessed by color flow Doppler.  4. The interatrial septum appears to be lipomatous.   EKG:  EKG is ordered today.  The ekg ordered today demonstrates normal sinus rhythm, no significant ST-T wave changes  Recent Labs: 08/03/2019: BUN 21; Creatinine, Ser 0.66; Hemoglobin 13.6; Platelets 167; Potassium 4.6; Sodium 141; TSH 2.830 10/20/2019: ALT 33  Recent Lipid Panel    Component Value Date/Time   CHOL 150 10/20/2019 1213   TRIG 227 (H) 10/20/2019 1213   HDL 55 10/20/2019 1213   CHOLHDL 2.7 10/20/2019 1213   CHOLHDL 2.8 06/01/2018  0345   VLDL 30 06/01/2018 0345   LDLCALC 59 10/20/2019 1213    Physical Exam:    VS:  BP 135/82   Pulse 84   Temp 98.2 F (36.8 C)  Ht 5\' 5"  (1.651 m)   Wt 185 lb (83.9 kg)   SpO2 95%   BMI 30.79 kg/m     Wt Readings from Last 3 Encounters:  04/10/20 185 lb (83.9 kg)  10/04/19 187 lb (84.8 kg)  08/03/19 185 lb 9.6 oz (84.2 kg)     GEN:  Well nourished, well developed in no acute distress HEENT: Normal NECK: No JVD; No carotid bruits LYMPHATICS: No lymphadenopathy CARDIAC: RRR, no murmurs, rubs, gallops RESPIRATORY:  Clear to auscultation without rales, wheezing or rhonchi  ABDOMEN: Soft, non-tender, non-distended MUSCULOSKELETAL:  No edema; No deformity  SKIN: Warm and dry NEUROLOGIC:  Alert and oriented x 3 PSYCHIATRIC:  Normal affect   ASSESSMENT:    1. Coronary artery disease involving coronary bypass graft of native heart without angina pectoris   2. Dyslipidemia, goal LDL below 70   3. Essential hypertension   4. AV fistula (HCC)   5. Depression, unspecified depression type    PLAN:    In order of problems listed above:  1. CAD s/p CABG: She had one episode of chest pain in July and another episode of chest pain in August.  The 2 episodes of chest pain are separated by about a month and during this time, she did not have any exertional symptoms.  The characteristic of the chest pain was quite atypical in the sense that pain started in the state of right abdomen and radiating to the chest.  She took 2 nitroglycerin during the first episode and one nitroglycerin during the second episode.  Both episodes of chest pain resolved within 10 minutes.  Interestingly, since the last episode of chest pain over a month ago, she has been able to ride stationary bicycle for 30 minutes at a time without any symptoms.  Her chest pain is quite atypical, will continue to monitor unless there is any recurrence.  2. Hypertension: Blood pressure stable on current  therapy  3. Hyperlipidemia: Continue on current therapy  4. Right radial bruit: We found a bruit in her right radial area incidentally on exam.  I suspect there was a small AV fistula at the site of her right radial artery as result of the previous cardiac catheterization.  She is asymptomatic with no decrease stress or tingling sensation in her right hand.  Given lack of symptom, we decided not to pursue any further work-up.  5. Depression: According to patient, she still feel depressed occasionally after her husband passed away.  She has been seen by psychiatrist in the past however was released by psychiatry after it was felt her symptoms improved.   Medication Adjustments/Labs and Tests Ordered: Current medicines are reviewed at length with the patient today.  Concerns regarding medicines are outlined above.  Orders Placed This Encounter  Procedures  . Comprehensive metabolic panel  . Lipid panel  . EKG 12-Lead   No orders of the defined types were placed in this encounter.   Patient Instructions  Medication Instructions:  Your physician recommends that you continue on your current medications as directed. Please refer to the Current Medication list given to you today.  *If you need a refill on your cardiac medications before your next appointment, please call your pharmacy*  Lab Work: Your physician recommends that you return for lab work in 6 MONTHS prior to your follow up appointment:   CMET  Fasting Lipid Panel-DO NOT EAT OR DRINK PAST MIDNIGHT. OKAY TO HAVE WATER. If you have labs (blood work) drawn today and  your tests are completely normal, you will receive your results only by: Marland Kitchen MyChart Message (if you have MyChart) OR . A paper copy in the mail If you have any lab test that is abnormal or we need to change your treatment, we will call you to review the results.  Testing/Procedures: NONE ordered at this time of appointment   Follow-Up: At [facility], you and  your health needs are our priority.  As part of our continuing mission to provide you with exceptional heart care, we have created designated Provider Care Teams.  These Care Teams include your primary Cardiologist (physician) and Advanced Practice Providers (APPs -  Physician Assistants and Nurse Practitioners) who all work together to provide you with the care you need, when you need it.  Your next appointment:   6 month(s)  The format for your next appointment:   In Person  Provider:   [name], MD  Other Instructions      Signed, Azalee Course, PA  04/10/2020 5:06 PM    [facility]

## 2020-05-15 ENCOUNTER — Encounter: Payer: Self-pay | Admitting: General Practice

## 2020-06-04 NOTE — Progress Notes (Signed)
Virtual Visit via Telephone Note   This visit type was conducted due to national recommendations for restrictions regarding the COVID-19 Pandemic (e.g. social distancing) in an effort to limit this patient's exposure and mitigate transmission in our community.  Due to her co-morbid illnesses, this patient is at least at moderate risk for complications without adequate follow up.  This format is felt to be most appropriate for this patient at this time.  The patient did not have access to video technology/had technical difficulties with video requiring transitioning to audio format only (telephone).  All issues noted in this document were discussed and addressed.  No physical exam could be performed with this format.  Please refer to the patient's chart for her  consent to telehealth for [facility].  Evaluation Performed:  Follow-up visit  This visit type was conducted due to national recommendations for restrictions regarding the COVID-19 Pandemic (e.g. social distancing).  This format is felt to be [facility] appropriate for this patient at this time.  All issues noted in this document were discussed and addressed.  No physical exam was performed (except for noted visual exam findings with Video Visits).  Please refer to the patient's chart (MyChart message for video visits and phone note for telephone visits) for the patient's consent to telehealth for [facility]  Date:  06/05/2020   ID:  Monique KluverAnn T [name], DOB 06/09/1941, MRN [id number]  Patient Location:  [address] [place] [id number]   Provider location:     [facility] [address] Office [phone] Fax [phone]   PCP:  Elizabeth PalauAnderson, Teresa, FNP  Cardiologist:  Nanetta BattyJonathan Berry, MD  Electrophysiologist:  None   Chief Complaint: Follow-up for hypertension  History of Present Illness:    Monique Kluvernn T Penny is a 79 y.o. female who presents via audio/video  conferencing for a telehealth visit today.  Patient verified DOB and address.  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  Has a strong family history of coronary artery disease with a father that died of MI when he was 3741.  Underwent cardiac catheterization 10/19 which showed high-grade calcified proximal LAD lesion which received DES, 80% small ramus lesion and 40% ostial OM1 which were treated medically.  She was found to be anemic at that time with a hemoglobin of 8.1.  Post PCI she developed progressive angina and underwent repeat cardiac catheterization 11/2018 that showed aggressive LAD in-stent restenosis.  She eventually underwent CABG x3 by Dr. Laneta SimmersBartle November 21, 2018 (LIMA-LAD, SVG-OM1, and SVG-ramus.  Her postoperative course was uncomplicated.  She was seen by Dr. Allyson SabalBerry 10/04/2019.  It was felt she was doing well physically however she was somewhat depressed.  She was seen by Azalee CourseHao Meng, PA-C on 04/10/2020.  At that time she was noted to have continued depression however, she indicated she had been released by her psychiatrist.  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  She was also working with [employer].  On exam was noted that she had a right radial bruit.  It was felt this was a small AV fistula as result of previous cardiac catheterization.  She reported 1 episode of chest pain in July and another episode in August.  Both resolved with nitroglycerin.  She described the discomfort as starting the right side of her abdomen radiating into  her chest.  Each episode of chest pain lasted 5 to 10 minutes.  She reported that in between the 2 episodes of chest discomfort she was able to continue her physical activity/exercises without issue.  She continues to ride her stationary bike 30 minutes at a time without exertional chest pain.  No further ischemic evaluation  was pursued.  Her heart rate and blood pressure well controlled at that time.  She is seen today virtually in follow-up and states she is doing well.  She has been going to [employer] 3 times a week and riding the recumbent bike for 30 minutes at a time.  She is also been working with Systems analyst several days a week.  She states that her mood has increased and she would say that she has more spiritual rather than religious at this time.  She has visited her husband's grave in the last several weeks and feels that her depression is lifting.  She feels more at peace.  She has been following a low-sodium diet and is contemplating getting her knee replaced.  She attended a [employer] [employer] [facility] [employer] football game over the weekend, was required to do a great deal of walking, and has significant knee pain the next day.  She states that her knee has returned to normal at this time and she has been visiting orthopedics who have discussed knee replacement surgery with her.  She feels that she is overall healthier now than she has been in the last 10 years.  At this time I will have her continue her current diet and exercise and have her follow-up in 3 to 4 months with Dr. Allyson Sabal.  Today she denies chest pain, shortness of breath, lower extremity edema, fatigue, palpitations, melena, hematuria, hemoptysis, diaphoresis, weakness, presyncope, syncope, orthopnea, and PND.   The patient does not symptoms concerning for COVID-19 infection (fever, chills, cough, or new SHORTNESS OF BREATH).    Prior CV studies:   The following studies were reviewed today:  Echocardiogram 11/17/2018  IMPRESSIONS    1. The left ventricle has hyperdynamic systolic function, with an  ejection fraction of >65%. The cavity size was normal. There is mild  concentric left ventricular hypertrophy. Left ventricular diastolic  Doppler parameters are consistent with impaired  relaxation. No evidence of left ventricular regional wall motion    abnormalities.  2. The right ventricle has normal systolic function. The cavity was  normal. There is no increase in right ventricular wall thickness.  3. The aortic valve is tricuspid. Mild sclerosis of the aortic valve.  Aortic valve regurgitation was not assessed by color flow Doppler.  4. The interatrial septum appears to be lipomatous.  Past Medical History:  Diagnosis Date  . Anemia     mild as per PCP  . Arthritis    hip/ s/p recent shoulder fracture 8/12- RIGHT  . Asthma   . Coronary artery disease 05/30/2018   LAD PCI/DES  . GI bleeding   . Hiatal hernia   . Humerus fracture    with wrist on right side  . Hypertension    hypercholesterolemia/  EKG on chart with clearance and note 06/18/11  [place]chi  . Pneumonia    Past Surgical History:  Procedure Laterality Date  . ABDOMINAL HYSTERECTOMY    . COLECTOMY WITH COLOSTOMY CREATION/HARTMANN PROCEDURE N/A 05/07/2016   Procedure: COLOSTOMY CREATION/HARTMANN PROCEDURE;  Surgeon: Romie Levee, MD;  Location: [facility];  Service: General;  Laterality: N/A;  . COLONOSCOPY N/A 08/26/2016   Procedure: COLONOSCOPY;  Surgeon: Romie Levee, MD;  Location: [facility];  Service: Endoscopy;  Laterality: N/A;  . COLOSTOMY TAKEDOWN N/A 08/27/2016   Procedure: LAPAROSCOPIC COLOSTOMY REVERSAL;  Surgeon: Romie Levee, MD;  Location: [facility];  Service: General;  Laterality: N/A;  . CORONARY ARTERY BYPASS GRAFT N/A 11/21/2018   Procedure: CORONARY ARTERY BYPASS GRAFTING (CABG) x Three , using left internal mammary artery and right leg greater saphenous vein harvested endoscopically;  Surgeon: Alleen Borne, MD;  Location: [facility] OR;  Service: Open Heart Surgery;  Laterality: N/A;  . CORONARY STENT INTERVENTION N/A 05/30/2018   Procedure: CORONARY STENT INTERVENTION;  Surgeon: Runell Gess, MD;  Location: [facility]ASIVE CV LAB;  Service: Cardiovascular;  Laterality: N/A;  . EYE SURGERY     eyelid tuck bilateral  . LAPAROTOMY N/A 05/07/2016    Procedure: EXPLORATORY LAPAROTOMY, LYLSIS OF ADHSIONS, EVACATION OF PERITONEAL ABSCESS;  Surgeon: Romie Levee, MD;  Location: [facility];  Service: General;  Laterality: N/A;  . LEFT HEART CATH AND CORONARY ANGIOGRAPHY N/A 05/30/2018   Procedure: LEFT HEART CATH AND CORONARY ANGIOGRAPHY;  Surgeon: Runell Gess, MD;  Location: [facility];  Service: Cardiovascular;  Laterality: N/A;  . LEFT HEART CATH AND CORONARY ANGIOGRAPHY N/A 11/17/2018   Procedure: LEFT HEART CATH AND CORONARY ANGIOGRAPHY;  Surgeon: Runell Gess, MD;  Location: [facility];  Service: Cardiovascular;  Laterality: N/A;  . PARATHYROIDECTOMY     one lobe- benign per pt  . TEE WITHOUT CARDIOVERSION N/A 11/21/2018   Procedure: TRANSESOPHAGEAL ECHOCARDIOGRAM (TEE);  Surgeon: Alleen Borne, MD;  Location: [facility];  Service: Open Heart Surgery;  Laterality: N/A;  . TONSILLECTOMY    . TOTAL HIP ARTHROPLASTY  07/08/2011   Procedure: TOTAL HIP ARTHROPLASTY;  Surgeon: Gus Rankin Aluisio;  Location: [facility];  Service: Orthopedics;  Laterality: Right;     Current Meds  Medication Sig  . aspirin EC 81 MG tablet Take 81 mg by mouth daily.  Marland Kitchen atorvastatin (LIPITOR) 80 MG tablet TAKE 1 TABLET BY MOUTH AT DAILY 6PM  . carvedilol (COREG) 6.25 MG tablet TAKE 1 TABLET(6.25 MG) BY MOUTH TWICE DAILY WITH A MEAL  . cetirizine (ZYRTEC) 10 MG tablet Take 10 mg by mouth at bedtime.   . Cholecalciferol (VITAMIN D PO) Take 1,000 Units by mouth daily.   . clopidogrel (PLAVIX) 75 MG tablet Take 1 tablet (75 mg total) by mouth daily. On hold  . DULoxetine (CYMBALTA) 30 MG capsule Take 30 mg by mouth.  . DULoxetine (CYMBALTA) 60 MG capsule Take 60 mg by mouth daily.  Marland Kitchen lisinopril (PRINIVIL,ZESTRIL) 40 MG tablet Take 40 mg by mouth daily.  Marland Kitchen MAGNESIUM OXIDE PO Take 125 mg by mouth daily.   Marland Kitchen oxyCODONE-acetaminophen (PERCOCET/ROXICET) 5-325 MG tablet Take 1 tablet by mouth.  . pantoprazole (PROTONIX) 40 MG tablet Take 1 tablet (40 mg total)  by mouth 2 (two) times daily before a meal.  . polyethylene glycol (MIRALAX / GLYCOLAX) packet Take 17 g by mouth 2 (two) times daily. (Patient taking differently: Take 17 g by mouth at bedtime as needed for moderate constipation. )  . Prenatal MV-Min-Fe Fum-FA-DHA (PRENATAL MULTIVITAMIN + DHA) 28-0.8 & 200 MG MISC Take 1 tablet by mouth daily.  . RESTASIS 0.05 % ophthalmic emulsion SMARTSIG:1 Drop(s) In Eye(s) Every 12 Hours  . VENTOLIN HFA 108 (90 Base) MCG/ACT inhaler Inhale 2 puffs into the lungs every 4 (four) hours as needed for wheezing or shortness of breath.   . vitamin  B-12 (CYANOCOBALAMIN) 1000 MCG tablet Take 1,000 mcg by mouth daily.     Allergies:   Penicillins and Pregabalin   Social History   Tobacco Use  . Smoking status: Never Smoker  . Smokeless tobacco: Never Used  Vaping Use  . Vaping Use: Never used  Substance Use Topics  . Alcohol use: Yes    Comment: socially- 2 x year  . Drug use: No     Family Hx: The patient's family history includes CAD (age of onset: 73) in her father.  ROS:   Please see the history of present illness.     All other systems reviewed and are negative.   Labs/Other Tests and Data Reviewed:    Recent Labs: 08/03/2019: BUN 21; Creatinine, Ser 0.66; Hemoglobin 13.6; Platelets 167; Potassium 4.6; Sodium 141; TSH 2.830 10/20/2019: ALT 33   Recent Lipid Panel Lab Results  Component Value Date/Time   CHOL 150 10/20/2019 12:13 PM   TRIG 227 (H) 10/20/2019 12:13 PM   HDL 55 10/20/2019 12:13 PM   CHOLHDL 2.7 10/20/2019 12:13 PM   CHOLHDL 2.8 06/01/2018 03:45 AM   LDLCALC 59 10/20/2019 12:13 PM    Wt Readings from Last 3 Encounters:  06/05/20 183 lb (83 kg)  04/10/20 185 lb (83.9 kg)  10/04/19 187 lb (84.8 kg)     Exam:    Vital Signs:  Pulse (!) 112   Ht 5\' 5"  (1.651 m)   Wt 183 lb (83 kg)   BMI 30.45 kg/m    Well nourished, well developed female in no  acute distress.   ASSESSMENT & PLAN:    1.  Essential  hypertension-unable to obtain.  Has been well controlled at home Continue lisinopril, carvedilol Heart healthy low-sodium diet-salty 6 given Increase physical activity as tolerated  Coronary artery disease-no chest pain today.  No recent episodes of chest discomfort, arm jaw or neck pain. Continue aspirin, carvedilol, atorvastatin, Plavix, lisinopril Heart healthy low-sodium diet-salty 6 given Increase physical activity as tolerated  Hyperlipidemia- 10/20/2019: Cholesterol, Total 150; HDL 55; LDL Chol Calc (NIH) 59; Triglycerides 227 Continue atorvastatin Heart healthy low-sodium high-fiber diet Increase physical activity as tolerated  Depression-pleasant today.  Mood has been somewhat improved over the last 2 months.  She is now feeling more at peace with her husband's passing. Continue Cymbalta, vitamin D Increase physical activity as tolerated Followed by PCP  Disposition: Follow-up with Dr. 10/22/2019 in 3-4 months.  COVID-19 Education: The signs and symptoms of COVID-19 were discussed with the patient and how to seek care for testing (follow up with PCP or arrange E-visit).  The importance of social distancing was discussed today.  Patient Risk:   After full review of this patients clinical status, I feel that they are at least moderate risk at this time.  Time:   Today, I have spent 25 minutes with the patient with telehealth technology discussing diet, exercise, knee replacement, and CABG.  I spent greater than 20 minutes reviewing this patient's past medical history, cardiac testing, and previous cardiac notes.   Medication Adjustments/Labs and Tests Ordered: Current medicines are reviewed at length with the patient today.  Concerns regarding medicines are outlined above.   Tests Ordered: No orders of the defined types were placed in this encounter.  Medication Changes: No orders of the defined types were placed in this encounter.   Disposition:  in 3  month(s)  Signed, Allyson Sabal. Marletta Bousquet NP-C    03/07/2019 11:58 AM    [facility] [address] Office [phone] Fax [phone]

## 2020-06-05 ENCOUNTER — Telehealth (INDEPENDENT_AMBULATORY_CARE_PROVIDER_SITE_OTHER): Payer: Medicare Other | Admitting: General Practice

## 2020-06-05 ENCOUNTER — Encounter: Payer: Self-pay | Admitting: General Practice

## 2020-06-05 VITALS — HR 112 | Ht 65.0 in | Wt 183.0 lb

## 2020-06-05 DIAGNOSIS — F32A Depression, unspecified: Secondary | ICD-10-CM

## 2020-06-05 DIAGNOSIS — I25810 Atherosclerosis of coronary artery bypass graft(s) without angina pectoris: Secondary | ICD-10-CM | POA: Diagnosis not present

## 2020-06-05 DIAGNOSIS — I10 Essential (primary) hypertension: Secondary | ICD-10-CM | POA: Diagnosis not present

## 2020-06-05 DIAGNOSIS — E785 Hyperlipidemia, unspecified: Secondary | ICD-10-CM | POA: Diagnosis not present

## 2020-06-05 NOTE — Patient Instructions (Signed)
Medication Instructions:  The current medical regimen is effective;  continue present plan and medications as directed. Please refer to the Current Medication list given to you today.  *If you need a refill on your cardiac medications before your next appointment, please call your pharmacy*  Lab Work:   Testing/Procedures:  NONE    NONE  Follow-Up: Your next appointment:  KEEP SCHEDULED APPOINTMENT  In Person with [name], MD 10-08-2020 at [facility]  At [facility], you and your health needs are our priority.  As part of our continuing mission to provide you with exceptional heart care, we have created designated Provider Care Teams.  These Care Teams include your primary Cardiologist (physician) and Advanced Practice Providers (APPs -  Physician Assistants and Nurse Practitioners) who all work together to provide you with the care you need, when you need it.

## 2020-06-19 ENCOUNTER — Other Ambulatory Visit: Payer: Self-pay

## 2020-06-19 ENCOUNTER — Emergency Department (HOSPITAL_COMMUNITY)
Admission: EM | Admit: 2020-06-19 | Discharge: 2020-06-19 | Disposition: A | Discharge status: Home / Self Care | Payer: Medicare Other | Source: Home / Self Care | Attending: Emergency Medicine | Admitting: Emergency Medicine

## 2020-06-19 ENCOUNTER — Encounter (HOSPITAL_COMMUNITY): Payer: Self-pay | Admitting: Student

## 2020-06-19 ENCOUNTER — Emergency Department (HOSPITAL_COMMUNITY): Payer: Medicare Other

## 2020-06-19 DIAGNOSIS — I119 Hypertensive heart disease without heart failure: Secondary | ICD-10-CM | POA: Insufficient documentation

## 2020-06-19 DIAGNOSIS — N201 Calculus of ureter: Secondary | ICD-10-CM | POA: Insufficient documentation

## 2020-06-19 DIAGNOSIS — Z96643 Presence of artificial hip joint, bilateral: Secondary | ICD-10-CM | POA: Insufficient documentation

## 2020-06-19 DIAGNOSIS — J45909 Unspecified asthma, uncomplicated: Secondary | ICD-10-CM | POA: Insufficient documentation

## 2020-06-19 DIAGNOSIS — Z7982 Long term (current) use of aspirin: Secondary | ICD-10-CM | POA: Insufficient documentation

## 2020-06-19 DIAGNOSIS — I25810 Atherosclerosis of coronary artery bypass graft(s) without angina pectoris: Secondary | ICD-10-CM | POA: Insufficient documentation

## 2020-06-19 DIAGNOSIS — I2510 Atherosclerotic heart disease of native coronary artery without angina pectoris: Secondary | ICD-10-CM | POA: Insufficient documentation

## 2020-06-19 DIAGNOSIS — Z79899 Other long term (current) drug therapy: Secondary | ICD-10-CM | POA: Insufficient documentation

## 2020-06-19 DIAGNOSIS — N23 Unspecified renal colic: Secondary | ICD-10-CM

## 2020-06-19 DIAGNOSIS — N132 Hydronephrosis with renal and ureteral calculous obstruction: Secondary | ICD-10-CM | POA: Diagnosis not present

## 2020-06-19 LAB — COMPREHENSIVE METABOLIC PANEL
ALT: 33 U/L (ref 0–44)
AST: 38 U/L (ref 15–41)
Albumin: 4.4 g/dL (ref 3.5–5.0)
Alkaline Phosphatase: 67 U/L (ref 38–126)
Anion gap: 9 (ref 5–15)
BUN: 20 mg/dL (ref 8–23)
CO2: 26 mmol/L (ref 22–32)
Calcium: 8.9 mg/dL (ref 8.9–10.3)
Chloride: 106 mmol/L (ref 98–111)
Creatinine, Ser: 0.67 mg/dL (ref 0.44–1.00)
GFR, Estimated: >60 mL/min (ref >60)
Glucose, Bld: 150 mg/dL — ABNORMAL HIGH (ref 70–99)
Potassium: 4.8 mmol/L (ref 3.5–5.1)
Sodium: 141 mmol/L (ref 135–145)
Total Bilirubin: 1.3 mg/dL — ABNORMAL HIGH (ref 0.3–1.2)
Total Protein: 7 g/dL (ref 6.5–8.1)

## 2020-06-19 LAB — CBC
HCT: 44.1 % (ref 36.0–46.0)
Hemoglobin: 14.5 g/dL (ref 12.0–15.0)
MCH: 31.1 pg (ref 26.0–34.0)
MCHC: 32.9 g/dL (ref 30.0–36.0)
MCV: 94.6 fL (ref 80.0–100.0)
Platelets: 176 10*3/uL (ref 150–400)
RBC: 4.66 MIL/uL (ref 3.87–5.11)
RDW: 13.2 % (ref 11.5–15.5)
WBC: 12.4 10*3/uL — ABNORMAL HIGH (ref 4.0–10.5)
nRBC: 0 % (ref 0.0–0.2)

## 2020-06-19 LAB — URINALYSIS, ROUTINE W REFLEX MICROSCOPIC
Bacteria, UA: NONE SEEN
Bilirubin Urine: NEGATIVE
Glucose, UA: NEGATIVE mg/dL
Ketones, ur: NEGATIVE mg/dL
Leukocytes,Ua: NEGATIVE
Nitrite: NEGATIVE
Protein, ur: NEGATIVE mg/dL
RBC / HPF: >50 RBC/hpf — ABNORMAL HIGH (ref 0–5)
Specific Gravity, Urine: 1.018 (ref 1.005–1.030)
pH: 5 (ref 5.0–8.0)

## 2020-06-19 LAB — LIPASE, BLOOD: Lipase: 23 U/L (ref 11–51)

## 2020-06-19 MED ORDER — FENTANYL CITRATE (PF) 100 MCG/2ML IJ SOLN
50.0000 ug | INTRAMUSCULAR | Status: DC | PRN
  Administered 2020-06-19 (×2): 50 ug via INTRAVENOUS
  Filled 2020-06-19 (×2): qty 2

## 2020-06-19 MED ORDER — ONDANSETRON HCL 4 MG/2ML IJ SOLN
4.0000 mg | Freq: Once | INTRAMUSCULAR | Status: AC
  Administered 2020-06-19: 4 mg via INTRAVENOUS
  Filled 2020-06-19: qty 2

## 2020-06-19 MED ORDER — SODIUM CHLORIDE 0.9 % IV BOLUS
500.0000 mL | Freq: Once | INTRAVENOUS | Status: AC
  Administered 2020-06-19: 500 mL via INTRAVENOUS

## 2020-06-19 MED ORDER — OXYCODONE-ACETAMINOPHEN 5-325 MG PO TABS
2.0000 | ORAL_TABLET | ORAL | 0 refills | Status: DC | PRN
Start: 2020-06-19 — End: 2020-06-23

## 2020-06-19 MED ORDER — KETOROLAC TROMETHAMINE 30 MG/ML IJ SOLN
15.0000 mg | Freq: Once | INTRAMUSCULAR | Status: AC
  Administered 2020-06-19: 15 mg via INTRAVENOUS
  Filled 2020-06-19: qty 1

## 2020-06-19 MED ORDER — TAMSULOSIN HCL 0.4 MG PO CAPS: ORAL_CAPSULE | ORAL | 0 refills | Status: DC

## 2020-06-19 MED ORDER — IOHEXOL 300 MG/ML  SOLN
100.0000 mL | Freq: Once | INTRAMUSCULAR | Status: AC | PRN
  Administered 2020-06-19: 100 mL via INTRAVENOUS

## 2020-06-19 MED ORDER — SODIUM CHLORIDE (PF) 0.9 % IJ SOLN
INTRAMUSCULAR | Status: AC
  Filled 2020-06-19: qty 50

## 2020-06-19 NOTE — ED Notes (Signed)
Patient transported to CT 

## 2020-06-19 NOTE — ED Provider Notes (Signed)
[facility]-EMERGENCY DEPT Provider Note   CSN: [id number] Arrival date & time: 06/19/20  1303     History Chief Complaint  Patient presents with  . Abdominal Pain  . Nausea    Monique Kluvernn T Fedorko is a 79 y.o. female.  HPI She presents for evaluation of abdominal pain which started today and feels like prior pain when she had to have an abscess drained and colostomy placed.  This is been preceded by 1 month of generally decreased appetite.  Otherwise she is doing well.  She denies fever, chills, nausea, vomiting, diarrhea, constipation, dysuria or urinary frequency.  She feels like she is having post urinary voiding "pressure."  This problem started today.  No known sick exposures.  Covid immunization status is up-to-date with booster.  There are no other known modifying factors.    Past Medical History:  Diagnosis Date  . Anemia     mild as per PCP  . Arthritis    hip/ s/p recent shoulder fracture 8/12- RIGHT  . Asthma   . Coronary artery disease 05/30/2018   LAD PCI/DES  . GI bleeding   . Hiatal hernia   . Humerus fracture    with wrist on right side  . Hypertension    hypercholesterolemia/  EKG on chart with clearance and note 06/18/11  Mazzocchi  . Pneumonia     Patient Active Problem List   Diagnosis Date Noted  . 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 CAD in native artery 11/17/2018  . Anemia 06/08/2018  . Redness and swelling of upper arm 06/08/2018  . CAD S/P percutaneous coronary angioplasty 05/30/2018  . Chest pain 05/27/2018  . Sinus tachycardia 08/20/2017  . Dyslipidemia, goal LDL below 70 08/20/2017  . 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 Essential hypertension   . 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 ABDOMINAL HYSTERECTOMY    . COLECTOMY WITH COLOSTOMY CREATION/HARTMANN PROCEDURE N/A 05/07/2016   Procedure: COLOSTOMY CREATION/HARTMANN PROCEDURE;   Surgeon: Romie LeveeAlicia Thomas, MD;  Location: [facility] ORS;  Service: General;  Laterality: N/A;  . COLONOSCOPY N/A 08/26/2016   Procedure: COLONOSCOPY;  Surgeon: Romie LeveeAlicia Thomas, MD;  Location: [facility] ENDOSCOPY;  Service: Endoscopy;  Laterality: N/A;  . COLOSTOMY TAKEDOWN N/A 08/27/2016   Procedure: LAPAROSCOPIC COLOSTOMY REVERSAL;  Surgeon: Romie LeveeAlicia Thomas, MD;  Location: [facility];  Service: General;  Laterality: N/A;  . CORONARY ARTERY BYPASS GRAFT N/A 11/21/2018   Procedure: CORONARY ARTERY BYPASS GRAFTING (CABG) x Three , using left internal mammary artery and right leg greater saphenous vein harvested endoscopically;  Surgeon: Alleen BorneBartle, Bryan K, MD;  Location: [facility] OR;  Service: Open Heart Surgery;  Laterality: N/A;  . CORONARY STENT INTERVENTION N/A 05/30/2018   Procedure: CORONARY STENT INTERVENTION;  Surgeon: Runell GessBerry, Jonathan J, MD;  Location: [facility] INVASIVE CV LAB;  Service: Cardiovascular;  Laterality: N/A;  . EYE SURGERY     eyelid tuck bilateral  . LAPAROTOMY N/A 05/07/2016   Procedure: EXPLORATORY LAPAROTOMY, LYLSIS OF ADHSIONS, EVACATION OF PERITONEAL ABSCESS;  Surgeon: Romie LeveeAlicia Thomas, MD;  Location: [facility];  Service: General;  Laterality: N/A;  . LEFT HEART CATH AND CORONARY ANGIOGRAPHY N/A 05/30/2018   Procedure: LEFT HEART CATH AND CORONARY ANGIOGRAPHY;  Surgeon: Runell GessBerry, Jonathan J, MD;  Location: [facility] INVASIVE CV LAB;  Service: Cardiovascular;  Laterality: N/A;  . LEFT HEART CATH AND CORONARY ANGIOGRAPHY N/A 11/17/2018   Procedure: LEFT HEART CATH AND CORONARY ANGIOGRAPHY;  Surgeon: Runell Gess, MD;  Location: [facility] INVASIVE CV LAB;  Service: Cardiovascular;  Laterality: N/A;  . PARATHYROIDECTOMY     one lobe- benign per pt  . TEE WITHOUT CARDIOVERSION N/A 11/21/2018   Procedure: TRANSESOPHAGEAL ECHOCARDIOGRAM (TEE);  Surgeon: Alleen Borne, MD;  Location: [facility] OR;  Service: Open Heart Surgery;  Laterality: N/A;  . TONSILLECTOMY    . TOTAL HIP ARTHROPLASTY  07/08/2011   Procedure: TOTAL HIP ARTHROPLASTY;   Surgeon: Gus Rankin Aluisio;  Location: [facility] ORS;  Service: Orthopedics;  Laterality: Right;     OB History   No obstetric history on file.     Family History  Problem Relation Age of Onset  . CAD Father 59       PCI    Social History   Tobacco Use  . Smoking status: Never Smoker  . Smokeless tobacco: Never Used  Vaping Use  . Vaping Use: Never used  Substance Use Topics  . Alcohol use: Yes    Comment: socially- 2 x year  . Drug use: No    Home Medications Prior to Admission medications   Medication Sig Start Date End Date Taking? Authorizing Provider  aspirin EC 81 MG tablet Take 81 mg by mouth daily.    [provider]  atorvastatin (LIPITOR) 80 MG tablet TAKE 1 TABLET BY MOUTH AT DAILY 6PM 08/22/19   Runell Gess, MD  carvedilol (COREG) 6.25 MG tablet TAKE 1 TABLET(6.25 MG) BY MOUTH TWICE DAILY WITH A MEAL 08/24/19   Runell Gess, MD  cetirizine (ZYRTEC) 10 MG tablet Take 10 mg by mouth at bedtime.     [provider]  Cholecalciferol (VITAMIN D PO) Take 1,000 Units by mouth daily.     [provider]  clopidogrel (PLAVIX) 75 MG tablet Take 1 tablet (75 mg total) by mouth daily. On hold 12/12/19   Runell Gess, MD  DULoxetine (CYMBALTA) 30 MG capsule Take 30 mg by mouth. 07/26/19   [provider]  DULoxetine (CYMBALTA) 60 MG capsule Take 60 mg by mouth daily. 07/10/19   [provider]  lisinopril (PRINIVIL,ZESTRIL) 40 MG tablet Take 40 mg by mouth daily.    [provider]  MAGNESIUM OXIDE PO Take 125 mg by mouth daily.     [provider]  oxyCODONE-acetaminophen (PERCOCET/ROXICET) 5-325 MG tablet Take 1 tablet by mouth. 09/25/19   [provider]  pantoprazole (PROTONIX) 40 MG tablet Take 1 tablet (40 mg total) by mouth 2 (two) times daily before a meal. 05/18/16   Simaan, Francine Graven, PA-C  polyethylene glycol (MIRALAX / GLYCOLAX) packet Take 17 g by mouth 2 (two) times daily. Patient  taking differently: Take 17 g by mouth at bedtime as needed for moderate constipation.  05/18/16   Adam Phenix, PA-C  Prenatal MV-Min-Fe Fum-FA-DHA (PRENATAL MULTIVITAMIN + DHA) 28-0.8 & 200 MG MISC Take 1 tablet by mouth daily.    [provider]  RESTASIS 0.05 % ophthalmic emulsion SMARTSIG:1 Drop(s) In Eye(s) Every 12 Hours 08/20/19   [provider]  VENTOLIN HFA 108 (90 Base) MCG/ACT inhaler Inhale 2 puffs into the lungs every 4 (four) hours as needed for wheezing or shortness of breath.  11/02/18   [provider]  vitamin B-12 (CYANOCOBALAMIN) 1000 MCG tablet Take 1,000 mcg by mouth daily.    [provider]    Allergies    Morphine, Penicillins, and Pregabalin  Review of Systems   Review of Systems  All other systems reviewed and are negative.   Physical Exam Updated Vital Signs BP (!) 205/91   Pulse 77   Temp 97.7 F (36.5 C) (Oral)   Resp 13   Ht 5\' 5"  (1.651 m)   Wt 82.6 kg   SpO2 96%   BMI 30.29 kg/m   Physical Exam Vitals and nursing note reviewed.  Constitutional:      General: She is not in acute distress.    Appearance: She is well-developed. She is not ill-appearing, toxic-appearing or diaphoretic.  HENT:     Head: Normocephalic and atraumatic.     Right Ear: External ear normal.     Left Ear: External ear normal.  Eyes:     Conjunctiva/sclera: Conjunctivae normal.     Pupils: Pupils are equal, round, and reactive to light.  Neck:     Trachea: Phonation normal.  Cardiovascular:     Rate and Rhythm: Normal rate and regular rhythm.     Heart sounds: Normal heart sounds.  Pulmonary:     Effort: Pulmonary effort is normal.     Breath sounds: Normal breath sounds.  Abdominal:     General: There is no distension.     Palpations: Abdomen is soft.     Tenderness: There is abdominal tenderness (Left lower quadrant, mild). There is no guarding or rebound.  Musculoskeletal:        General: Normal range of motion.      Cervical back: Normal range of motion and neck supple.  Skin:    General: Skin is warm and dry.  Neurological:     Mental Status: She is alert and oriented to person, place, and time.     Cranial Nerves: No cranial nerve deficit.     Sensory: No sensory deficit.     Motor: No abnormal muscle tone.     Coordination: Coordination normal.  Psychiatric:        Mood and Affect: Mood normal.        Behavior: Behavior normal.        Thought Content: Thought content normal.        Judgment: Judgment normal.     ED Results / Procedures / Treatments   Labs (all labs ordered are listed, but only abnormal results are displayed) Labs Reviewed  LIPASE, BLOOD  COMPREHENSIVE METABOLIC PANEL  CBC  URINALYSIS, ROUTINE W REFLEX MICROSCOPIC    EKG None  Radiology No results found.  Procedures Procedures (including critical care time)  Medications Ordered in ED Medications - No data to display  ED Course  I have reviewed the triage vital signs and the nursing notes.  Pertinent labs & imaging results that were available during my care of the patient were reviewed by me and considered in my medical decision making (see chart for details).    MDM Rules/Calculators/A&P                           Patient Vitals for the past 24 hrs:  BP Temp Temp src Pulse Resp SpO2 Height Weight  06/19/20 1500 (!) 211/87 -- -- 76 16 95 % -- --  06/19/20 1430 (!) 205/91 -- -- 77 13 96 % -- --  06/19/20 1400 (!) 202/88 -- -- 78 15 96 % -- --  06/19/20 1330 (!) 196/80 -- -- 78 15 97 % -- --  06/19/20 1326 (!) 194/88 -- -- 81 18 96 % -- --  06/19/20 1320 -- 97.7  F (36.5 C) Oral -- -- -- -- --  06/19/20 1316 -- -- -- -- -- -- 5\' 5"  (1.651 m) 82.6 kg    At time of discharge-reevaluation with update and discussion. After initial assessment and treatment, an updated evaluation reveals pain controlled.  Findings discussed and questions answered.   Medical Decision Making:  This patient is  presenting for evaluation of abdominal pain, which does require a range of treatment options, and is a complaint that involves a high risk of morbidity and mortality. The differential diagnoses include colitis, UTI, recurrent abscess. I decided to review old records, and in summary elderly female with prior colostomy after intra-abdominal infection, which has been reversed.  I did not require additional historical information from anyone.  Clinical Laboratory Tests Ordered, included CBC, Metabolic panel and Urinalysis. Review indicates blood in urine, elevated glucose, elevated white count.  Remainder normal. Radiologic Tests Ordered, included CT abdomen pelvis.  I independently Visualized: Radiographic images, which show left distal ureter with mild obstructive uropathy   Critical Interventions-clinical evaluation, laboratory evaluation, radiography, observation reassessment; treatment with medication to control symptoms.  After These Interventions, the Patient was reevaluated and was found with abdominal pain secondary to obstructive uropathy.  Stone is distal and relatively small and likely will pass.  Patient has chronic pain and takes for 5 mg oxycodone daily.  I will increase her oxycodone to 10 mg 4 times daily as needed pain and prescribe Flomax.  She is referred to urology.  CRITICAL CARE-no Performed by: Mancel Bale  Nursing Notes Reviewed/ Care Coordinated Applicable Imaging Reviewed Interpretation of Laboratory Data incorporated into ED treatment  The patient appears reasonably screened and/or stabilized for discharge and I doubt any other medical condition or other [facility] requiring further screening, evaluation, or treatment in the ED at this time prior to discharge.  Plan: Home Medications-increase oxycodone, continue other medicine; Home Treatments-rest, fluids; return here if the recommended treatment, does not improve the symptoms; Recommended follow up-neurology follow-up 1  week and as needed     Final Clinical Impression(s) / ED Diagnoses Final diagnoses:  Ureter colic  Ureteral stone    Rx / DC Orders ED Discharge Orders    None       [facility], MD 06/20/20 [phone]

## 2020-06-19 NOTE — ED Triage Notes (Signed)
[name] [facility] c/o abdominal pain X 3 hours with nausea but no vomiting. Patient had an abscessed colon and had a colostomy because of that but that was reversedl 3 years ago.  Patient stating this feels like it did when she had the abscessed  Generalized weakness X1 month. 18g left AC, patient received 4mg  Zofran.  Patient has HTN and has not taken her meds today.  Vitals were 214/100 84 16 97% RA 191-CBG

## 2020-06-19 NOTE — Discharge Instructions (Addendum)
You will probably need to double up on your oxycodone, until the stone passes.  This typically takes two or 3 days.  Call your urologist for a follow-up appointment.  We sent prescriptions to the 24-hour [facility].

## 2020-06-20 ENCOUNTER — Telehealth: Payer: Self-pay | Admitting: Cardiovascular Disease

## 2020-06-20 NOTE — Telephone Encounter (Signed)
Pt c/o of Chest Pain: STAT if CP now or developed within 24 hours  1. Are you having CP right now? No   2. Are you experiencing any other symptoms (ex. SOB, nausea, vomiting, sweating)? No   3. How long have you been experiencing CP? Started this morning   4. Is your CP continuous or coming and going? Continuous   5. Have you taken Nitroglycerin? Took 3 Nitroglycerin around 5:30 AM spread out in 15 minute time spans  ?

## 2020-06-20 NOTE — Telephone Encounter (Signed)
Patient has a history of bypass surgery in 2020.  I last saw the patient in September, EKG at the time showed minimal T wave inversion in V1 through V3.  Patient was recently seen by Edd Fabian, NP via virtual visit, at which time she was doing well.  She unfortunately went to the hospital yesterday due to lower abdominal pain and found to have a passing kidney stone.  She was released from the hospital with pain medication.  She woke up around 5 AM this morning with pressure in the chest, she took 3 nitroglycerin before the chest pain went away.  This is the only episode of chest discomfort in the past several months and it has completely resolved on the third nitroglycerin before 6 AM this morning.  I discussed the case with DOD Dr. Royann Shivers, given the resolution of chest pain and symptom-free for the past 6 hours, will continue observation.  She is aware that if the chest pain come back, she will need to go to the ED.  Otherwise I plan to see her in the office earlier.  Appointment scheduled for December 22 with me.

## 2020-06-21 ENCOUNTER — Ambulatory Visit (HOSPITAL_COMMUNITY): Payer: Medicare Other

## 2020-06-21 ENCOUNTER — Ambulatory Visit (HOSPITAL_COMMUNITY): Payer: Medicare Other | Admitting: Anesthesiology

## 2020-06-21 ENCOUNTER — Encounter (HOSPITAL_COMMUNITY)
Admission: AD | Disposition: A | Discharge status: Home / Self Care | Payer: Self-pay | Source: Ambulatory Visit | Attending: Urology

## 2020-06-21 ENCOUNTER — Other Ambulatory Visit: Payer: Self-pay

## 2020-06-21 ENCOUNTER — Inpatient Hospital Stay (HOSPITAL_COMMUNITY)
Admission: AD | Admit: 2020-06-21 | Discharge: 2020-06-23 | DRG: 694 | Disposition: A | Discharge status: Home / Self Care | Payer: Medicare Other | Attending: Urology | Admitting: Urology

## 2020-06-21 ENCOUNTER — Encounter (HOSPITAL_COMMUNITY): Payer: Self-pay | Admitting: Urology

## 2020-06-21 ENCOUNTER — Other Ambulatory Visit: Payer: Self-pay | Admitting: Urology

## 2020-06-21 DIAGNOSIS — R8271 Bacteriuria: Secondary | ICD-10-CM | POA: Diagnosis present

## 2020-06-21 DIAGNOSIS — D649 Anemia, unspecified: Secondary | ICD-10-CM | POA: Diagnosis present

## 2020-06-21 DIAGNOSIS — E7800 Pure hypercholesterolemia, unspecified: Secondary | ICD-10-CM | POA: Diagnosis present

## 2020-06-21 DIAGNOSIS — Z79899 Other long term (current) drug therapy: Secondary | ICD-10-CM

## 2020-06-21 DIAGNOSIS — Z8249 Family history of ischemic heart disease and other diseases of the circulatory system: Secondary | ICD-10-CM

## 2020-06-21 DIAGNOSIS — Z933 Colostomy status: Secondary | ICD-10-CM

## 2020-06-21 DIAGNOSIS — Z955 Presence of coronary angioplasty implant and graft: Secondary | ICD-10-CM

## 2020-06-21 DIAGNOSIS — R509 Fever, unspecified: Secondary | ICD-10-CM | POA: Diagnosis not present

## 2020-06-21 DIAGNOSIS — K449 Diaphragmatic hernia without obstruction or gangrene: Secondary | ICD-10-CM | POA: Diagnosis present

## 2020-06-21 DIAGNOSIS — N132 Hydronephrosis with renal and ureteral calculous obstruction: Principal | ICD-10-CM | POA: Diagnosis present

## 2020-06-21 DIAGNOSIS — J45909 Unspecified asthma, uncomplicated: Secondary | ICD-10-CM | POA: Diagnosis present

## 2020-06-21 DIAGNOSIS — I10 Essential (primary) hypertension: Secondary | ICD-10-CM | POA: Diagnosis present

## 2020-06-21 DIAGNOSIS — Z880 Allergy status to penicillin: Secondary | ICD-10-CM

## 2020-06-21 DIAGNOSIS — Z96641 Presence of right artificial hip joint: Secondary | ICD-10-CM | POA: Diagnosis present

## 2020-06-21 DIAGNOSIS — Z885 Allergy status to narcotic agent status: Secondary | ICD-10-CM

## 2020-06-21 DIAGNOSIS — N8110 Cystocele, unspecified: Secondary | ICD-10-CM | POA: Diagnosis present

## 2020-06-21 DIAGNOSIS — Z7982 Long term (current) use of aspirin: Secondary | ICD-10-CM

## 2020-06-21 DIAGNOSIS — Z888 Allergy status to other drugs, medicaments and biological substances status: Secondary | ICD-10-CM

## 2020-06-21 DIAGNOSIS — I2510 Atherosclerotic heart disease of native coronary artery without angina pectoris: Secondary | ICD-10-CM | POA: Diagnosis present

## 2020-06-21 DIAGNOSIS — M1990 Unspecified osteoarthritis, unspecified site: Secondary | ICD-10-CM | POA: Diagnosis present

## 2020-06-21 DIAGNOSIS — Z951 Presence of aortocoronary bypass graft: Secondary | ICD-10-CM

## 2020-06-21 DIAGNOSIS — Z20822 Contact with and (suspected) exposure to covid-19: Secondary | ICD-10-CM | POA: Diagnosis present

## 2020-06-21 HISTORY — DX: Angina pectoris, unspecified: I20.9

## 2020-06-21 HISTORY — PX: CYSTOSCOPY WITH RETROGRADE PYELOGRAM, URETEROSCOPY AND STENT PLACEMENT: SHX5789

## 2020-06-21 LAB — RESP PANEL BY RT-PCR (FLU A&B, COVID) ARPGX2
Influenza A by PCR: NEGATIVE
Influenza B by PCR: NEGATIVE
SARS Coronavirus 2 by RT PCR: NEGATIVE

## 2020-06-21 SURGERY — CYSTOURETEROSCOPY, WITH RETROGRADE PYELOGRAM AND STENT INSERTION
Anesthesia: General | Laterality: Bilateral

## 2020-06-21 MED ORDER — LIDOCAINE 2% (20 MG/ML) 5 ML SYRINGE
INTRAMUSCULAR | Status: DC | PRN
  Administered 2020-06-21: 100 mg via INTRAVENOUS

## 2020-06-21 MED ORDER — FENTANYL CITRATE (PF) 100 MCG/2ML IJ SOLN: 25.0000 ug | INTRAMUSCULAR | Status: DC | PRN

## 2020-06-21 MED ORDER — LACTATED RINGERS IV SOLN: INTRAVENOUS | Status: DC

## 2020-06-21 MED ORDER — OXYCODONE HCL 5 MG PO TABS
5.0000 mg | ORAL_TABLET | ORAL | Status: DC | PRN
  Administered 2020-06-21 – 2020-06-23 (×7): 5 mg via ORAL
  Filled 2020-06-21 (×7): qty 1

## 2020-06-21 MED ORDER — CHLORHEXIDINE GLUCONATE 0.12 % MT SOLN
15.0000 mL | Freq: Once | OROMUCOSAL | Status: AC
  Administered 2020-06-21: 15 mL via OROMUCOSAL

## 2020-06-21 MED ORDER — IOHEXOL 300 MG/ML  SOLN
INTRAMUSCULAR | Status: DC | PRN
  Administered 2020-06-21: 23 mL

## 2020-06-21 MED ORDER — KETOROLAC TROMETHAMINE 15 MG/ML IJ SOLN
15.0000 mg | Freq: Four times a day (QID) | INTRAMUSCULAR | Status: AC
  Administered 2020-06-21 – 2020-06-22 (×6): 15 mg via INTRAVENOUS
  Filled 2020-06-21 (×6): qty 1

## 2020-06-21 MED ORDER — SODIUM CHLORIDE 0.9 % IV SOLN: INTRAVENOUS | Status: DC

## 2020-06-21 MED ORDER — SENNOSIDES-DOCUSATE SODIUM 8.6-50 MG PO TABS
1.0000 | ORAL_TABLET | Freq: Two times a day (BID) | ORAL | Status: DC
  Administered 2020-06-21 – 2020-06-23 (×4): 1 via ORAL
  Filled 2020-06-21 (×4): qty 1

## 2020-06-21 MED ORDER — DEXAMETHASONE SODIUM PHOSPHATE 10 MG/ML IJ SOLN: INTRAMUSCULAR | Status: DC | PRN

## 2020-06-21 MED ORDER — FENTANYL CITRATE (PF) 100 MCG/2ML IJ SOLN
INTRAMUSCULAR | Status: AC
  Filled 2020-06-21: qty 2

## 2020-06-21 MED ORDER — ACETAMINOPHEN 500 MG PO TABS
1000.0000 mg | ORAL_TABLET | Freq: Three times a day (TID) | ORAL | Status: AC
  Administered 2020-06-21 – 2020-06-22 (×3): 1000 mg via ORAL
  Filled 2020-06-21 (×3): qty 2

## 2020-06-21 MED ORDER — MIDAZOLAM HCL 2 MG/2ML IJ SOLN
INTRAMUSCULAR | Status: DC | PRN
  Administered 2020-06-21: .5 mg via INTRAVENOUS

## 2020-06-21 MED ORDER — PROPOFOL 10 MG/ML IV BOLUS
INTRAVENOUS | Status: DC | PRN
  Administered 2020-06-21: 120 mg via INTRAVENOUS

## 2020-06-21 MED ORDER — ORAL CARE MOUTH RINSE: 15.0000 mL | Freq: Once | OROMUCOSAL | Status: AC

## 2020-06-21 MED ORDER — MIDAZOLAM HCL 2 MG/2ML IJ SOLN
INTRAMUSCULAR | Status: AC
  Filled 2020-06-21: qty 2

## 2020-06-21 MED ORDER — ACETAMINOPHEN 10 MG/ML IV SOLN: 1000.0000 mg | Freq: Once | INTRAVENOUS | Status: DC | PRN

## 2020-06-21 MED ORDER — PROPOFOL 10 MG/ML IV BOLUS
INTRAVENOUS | Status: AC
  Filled 2020-06-21: qty 20

## 2020-06-21 MED ORDER — GENTAMICIN SULFATE 40 MG/ML IJ SOLN
5.0000 mg/kg | Freq: Once | INTRAVENOUS | Status: AC
  Administered 2020-06-21: 340 mg via INTRAVENOUS
  Filled 2020-06-21: qty 8.5

## 2020-06-21 MED ORDER — PHENYLEPHRINE 40 MCG/ML (10ML) SYRINGE FOR IV PUSH (FOR BLOOD PRESSURE SUPPORT)
PREFILLED_SYRINGE | INTRAVENOUS | Status: DC | PRN
  Administered 2020-06-21: 80 ug via INTRAVENOUS

## 2020-06-21 MED ORDER — FENTANYL CITRATE (PF) 250 MCG/5ML IJ SOLN
INTRAMUSCULAR | Status: DC | PRN
  Administered 2020-06-21 (×2): 50 ug via INTRAVENOUS

## 2020-06-21 MED ORDER — ONDANSETRON HCL 4 MG/2ML IJ SOLN
INTRAMUSCULAR | Status: DC | PRN
  Administered 2020-06-21: 4 mg via INTRAVENOUS

## 2020-06-21 MED ORDER — 0.9 % SODIUM CHLORIDE (POUR BTL) OPTIME
TOPICAL | Status: DC | PRN
  Administered 2020-06-21: 1000 mL

## 2020-06-21 MED ORDER — HYDROMORPHONE HCL 1 MG/ML IJ SOLN: 0.5000 mg | INTRAMUSCULAR | Status: DC | PRN

## 2020-06-21 MED ORDER — SODIUM CHLORIDE 0.9 % IR SOLN
Status: DC | PRN
  Administered 2020-06-21: 6000 mL

## 2020-06-21 MED ORDER — ONDANSETRON HCL 4 MG/2ML IJ SOLN: 4.0000 mg | Freq: Once | INTRAMUSCULAR | Status: DC | PRN

## 2020-06-21 SURGICAL SUPPLY — 22 items
BAG URO CATCHER STRL LF (MISCELLANEOUS) ×3 IMPLANT
BASKET LASER NITINOL 1.9FR (BASKET) IMPLANT
CATH INTERMIT  6FR 70CM (CATHETERS) ×3 IMPLANT
CLOTH BEACON ORANGE TIMEOUT ST (SAFETY) ×3 IMPLANT
EXTRACTOR STONE 1.7FRX115CM (UROLOGICAL SUPPLIES) IMPLANT
GLOVE BIOGEL M STRL SZ7.5 (GLOVE) ×3 IMPLANT
GOWN STRL REUS W/TWL LRG LVL3 (GOWN DISPOSABLE) ×3 IMPLANT
GUIDEWIRE ANG ZIPWIRE 038X150 (WIRE) ×3 IMPLANT
GUIDEWIRE STR DUAL SENSOR (WIRE) IMPLANT
KIT TURNOVER KIT A (KITS) IMPLANT
LASER FIB FLEXIVA PULSE ID 365 (Laser) IMPLANT
MANIFOLD NEPTUNE II (INSTRUMENTS) ×3 IMPLANT
PACK CYSTO (CUSTOM PROCEDURE TRAY) ×3 IMPLANT
SHEATH URETERAL 12FRX28CM (UROLOGICAL SUPPLIES) IMPLANT
SHEATH URETERAL 12FRX35CM (MISCELLANEOUS) IMPLANT
STENT POLARIS 5FRX24 (STENTS) ×6 IMPLANT
TRACTIP FLEXIVA PULS ID 200XHI (Laser) IMPLANT
TRACTIP FLEXIVA PULSE ID 200 (Laser)
TUBE FEEDING 8FR 16IN STR KANG (MISCELLANEOUS) IMPLANT
TUBING CONNECTING 10 (TUBING) ×2 IMPLANT
TUBING CONNECTING 10' (TUBING) ×1
TUBING UROLOGY SET (TUBING) ×3 IMPLANT

## 2020-06-21 NOTE — Brief Op Note (Signed)
06/21/2020  5:40 PM  PATIENT:  Monique [name]  79 y.o. female  PRE-OPERATIVE DIAGNOSIS:  left ureteral calculi, bilateral renal stones, bacteruria  POST-OPERATIVE DIAGNOSIS:  left ureteral calculi, bilateral renal stones, bacteruria  PROCEDURE:  Procedure(s): CYSTOSCOPY WITH RETROGRADE PYELOGRAM, AND BILATERAL STENT PLACEMENT (Bilateral)  SURGEON:  Surgeon(s) and Role:    * Sebastian Ache, MD - Primary  PHYSICIAN ASSISTANT:   ASSISTANTS: none   ANESTHESIA:   general  EBL:  0 mL   BLOOD ADMINISTERED:none  DRAINS: none   LOCAL MEDICATIONS USED:  NONE  SPECIMEN:  Source of Specimen:  left renal pelvis urine  DISPOSITION OF SPECIMEN:  microbiology for GS and culture  COUNTS:  YES  TOURNIQUET:  * No tourniquets in log *  DICTATION: .Other Dictation: Dictation Number [id number]  PLAN OF CARE: Admit for overnight observation  PATIENT DISPOSITION:  PACU - hemodynamically stable.   Delay start of Pharmacological VTE agent (>24hrs) due to surgical blood loss or risk of bleeding: yes

## 2020-06-21 NOTE — H&P (Signed)
Monique [name] is an 79 y.o. female.    Chief Complaint: Pre-OP BILATERAL ureteral stent placement  HPI:   1 - Recurrent Urolithiasis - treated previously by Dr. Marlou Porch x several. CT 11/17 form ER with left 61mm UVJ stone with mild hydro but some perinephric stranding and L>R non-obstructing renal stones.   2 - Bacteruria - pt with some tachycardia to 100s, very mild maliase and scant bacteruria on office eval 11/19. No recent postive CX data.  Today "[name]" is seen for cysto and BILATERAL ureteral stent placement for left ureteral and bilateral renal stones in setting of bacteruria and some SIRS symptoms.   Past Medical History:  Diagnosis Date  . Anemia     mild as per PCP  . Arthritis    hip/ s/p recent shoulder fracture 8/12- RIGHT  . Asthma   . Coronary artery disease 05/30/2018   LAD PCI/DES  . GI bleeding   . Hiatal hernia   . Humerus fracture    with wrist on right side  . Hypertension    hypercholesterolemia/  EKG on chart with clearance and note 06/18/11  Mazzocchi  . Pneumonia     Past Surgical History:  Procedure Laterality Date  . ABDOMINAL HYSTERECTOMY    . COLECTOMY WITH COLOSTOMY CREATION/HARTMANN PROCEDURE N/A 05/07/2016   Procedure: COLOSTOMY CREATION/HARTMANN PROCEDURE;  Surgeon: Romie Levee, MD;  Location: [facility];  Service: General;  Laterality: N/A;  . COLONOSCOPY N/A 08/26/2016   Procedure: COLONOSCOPY;  Surgeon: Romie Levee, MD;  Location: [facility];  Service: Endoscopy;  Laterality: N/A;  . COLOSTOMY TAKEDOWN N/A 08/27/2016   Procedure: LAPAROSCOPIC COLOSTOMY REVERSAL;  Surgeon: Romie Levee, MD;  Location: [facility];  Service: General;  Laterality: N/A;  . CORONARY ARTERY BYPASS GRAFT N/A 11/21/2018   Procedure: CORONARY ARTERY BYPASS GRAFTING (CABG) x Three , using left internal mammary artery and right leg greater saphenous vein harvested endoscopically;  Surgeon: Alleen Borne, MD;  Location: [facility];  Service: Open Heart Surgery;  Laterality:  N/A;  . CORONARY STENT INTERVENTION N/A 05/30/2018   Procedure: CORONARY STENT INTERVENTION;  Surgeon: Runell Gess, MD;  Location: [facility];  Service: Cardiovascular;  Laterality: N/A;  . EYE SURGERY     eyelid tuck bilateral  . LAPAROTOMY N/A 05/07/2016   Procedure: EXPLORATORY LAPAROTOMY, LYLSIS OF ADHSIONS, EVACATION OF PERITONEAL ABSCESS;  Surgeon: Romie Levee, MD;  Location: [facility];  Service: General;  Laterality: N/A;  . LEFT HEART CATH AND CORONARY ANGIOGRAPHY N/A 05/30/2018   Procedure: LEFT HEART CATH AND CORONARY ANGIOGRAPHY;  Surgeon: Runell Gess, MD;  Location: [facility];  Service: Cardiovascular;  Laterality: N/A;  . LEFT HEART CATH AND CORONARY ANGIOGRAPHY N/A 11/17/2018   Procedure: LEFT HEART CATH AND CORONARY ANGIOGRAPHY;  Surgeon: Runell Gess, MD;  Location: [facility];  Service: Cardiovascular;  Laterality: N/A;  . PARATHYROIDECTOMY     one lobe- benign per pt  . TEE WITHOUT CARDIOVERSION N/A 11/21/2018   Procedure: TRANSESOPHAGEAL ECHOCARDIOGRAM (TEE);  Surgeon: Alleen Borne, MD;  Location: [facility] OR;  Service: Open Heart Surgery;  Laterality: N/A;  . TONSILLECTOMY    . TOTAL HIP ARTHROPLASTY  07/08/2011   Procedure: TOTAL HIP ARTHROPLASTY;  Surgeon: Gus Rankin Aluisio;  Location: [facility];  Service: Orthopedics;  Laterality: Right;    Family History  Problem Relation Age of Onset  . CAD Father 43       PCI   Social History:  reports  that she has never smoked. She has never used smokeless tobacco. She reports current alcohol use. She reports that she does not use drugs.  Allergies:  Allergies  Allergen Reactions  . Morphine Other (See Comments)  . Penicillins Other (See Comments)    UNSPECIFIED REACTION  Unknown reaction as a child.  Tolerated Zosyn 2017.  . Pregabalin Anxiety    No medications prior to admission.    Results for orders placed or performed during the hospital encounter of 06/19/20 (from the past 48  hour(s))  Lipase, blood     Status: None   Collection Time: 06/19/20  2:34 PM  Result Value Ref Range   Lipase 23 11 - 51 U/L    Comment: Performed at [facility], [address] [place], [place] [address]  Comprehensive metabolic panel     Status: Abnormal   Collection Time: 06/19/20  2:34 PM  Result Value Ref Range   Sodium 141 135 - 145 mmol/L   Potassium 4.8 3.5 - 5.1 mmol/L   Chloride 106 98 - 111 mmol/L   CO2 26 22 - 32 mmol/L   Glucose, Bld 150 (H) 70 - 99 mg/dL    Comment: Glucose reference range applies only to samples taken after fasting for at least 8 hours.   BUN 20 8 - 23 mg/dL   Creatinine, Ser 4.97 0.44 - 1.00 mg/dL   Calcium 8.9 8.9 - 02.6 mg/dL   Total Protein 7.0 6.5 - 8.1 g/dL   Albumin 4.4 3.5 - 5.0 g/dL   AST 38 15 - 41 U/L   ALT 33 0 - 44 U/L   Alkaline Phosphatase 67 38 - 126 U/L   Total Bilirubin 1.3 (H) 0.3 - 1.2 mg/dL   GFR, Estimated >37 >85 mL/min    Comment: (NOTE) Calculated using the CKD-EPI Creatinine Equation (2021)    Anion gap 9 5 - 15    Comment: Performed at [facility], [address], [place], [place] [address]  CBC     Status: Abnormal   Collection Time: 06/19/20  2:34 PM  Result Value Ref Range   WBC 12.4 (H) 4.0 - 10.5 K/uL   RBC 4.66 3.87 - 5.11 MIL/uL   Hemoglobin 14.5 12.0 - 15.0 g/dL   HCT 77.4 36 - 46 %   MCV 94.6 80.0 - 100.0 fL   MCH 31.1 26.0 - 34.0 pg   MCHC 32.9 30.0 - 36.0 g/dL   RDW 12.8 78.6 - 76.7 %   Platelets 176 150 - 400 K/uL   nRBC 0.0 0.0 - 0.2 %    Comment: Performed at [facility], [address], [place], [place] [address]  Urinalysis, Routine w reflex microscopic     Status: Abnormal   Collection Time: 06/19/20  2:34 PM  Result Value Ref Range   Color, Urine YELLOW YELLOW   APPearance CLEAR CLEAR   Specific Gravity, Urine 1.018 1.005 - 1.030   pH 5.0 5.0 - 8.0   Glucose, UA NEGATIVE NEGATIVE mg/dL   Hgb urine dipstick LARGE (A) NEGATIVE    Bilirubin Urine NEGATIVE NEGATIVE   Ketones, ur NEGATIVE NEGATIVE mg/dL   Protein, ur NEGATIVE NEGATIVE mg/dL   Nitrite NEGATIVE NEGATIVE   Leukocytes,Ua NEGATIVE NEGATIVE   RBC / HPF >50 (H) 0 - 5 RBC/hpf   WBC, UA 0-5 0 - 5 WBC/hpf   Bacteria, UA NONE SEEN NONE SEEN   Squamous Epithelial / LPF 0-5 0 - 5  Mucus PRESENT     Comment: Performed at [facility], [address] [place], [place] [address]   CT Abdomen Pelvis W Contrast  Result Date: 06/19/2020 CLINICAL DATA:  Generalized abdominal pain beginning today with nausea. EXAM: CT ABDOMEN AND PELVIS WITH CONTRAST TECHNIQUE: Multidetector CT imaging of the abdomen and pelvis was performed using the standard protocol following bolus administration of intravenous contrast. CONTRAST:  OMNIPAQUE IOHEXOL 300 MG/ML  SOLN COMPARISON:  05/15/2016 FINDINGS: Lower chest: Clear lung bases. Mild cardiomegaly with dense lad coronary artery atherosclerosis. Tiny hiatal hernia. Hepatobiliary: Moderate hepatic steatosis, without focal liver lesion. Multiple dependent gallstones with borderline gallbladder distension but no specific evidence of acute cholecystitis. No biliary duct dilatation. Pancreas: Fatty replacement throughout the pancreas. Spleen: Normal in size, without focal abnormality. Adrenals/Urinary Tract: Normal adrenal glands. Bilateral renal collecting system calculi, including a 12 mm stone within the left renal pelvis. Moderate left perinephric edema may represent forniceal rupture. Mild left hydroureter to the level of a 4 mm stone at the left ureterovesicular junction on 78/2. Degraded evaluation of the pelvis, secondary to beam hardening artifact from right hip arthroplasty. Stomach/Bowel: Normal remainder of the stomach. Surgical sutures within the sigmoid and pelvic small bowel. Scattered colonic diverticula. Normal terminal ileum and appendix. Otherwise normal small bowel. Vascular/Lymphatic: Aortic atherosclerosis.  No abdominopelvic adenopathy. Reproductive: Hysterectomy.  No adnexal mass. Other: No significant free fluid. Musculoskeletal: Right hip arthroplasty. Lumbosacral spondylosis with trace L4-5 and L5-S1 anterolisthesis. IMPRESSION: 1. Mild left-sided urinary tract obstruction secondary to a 4 mm stone at the left ureterovesicular junction. Moderate left perinephric edema may represent forniceal rupture. 2. Bilateral nephrolithiasis. 3. Hepatic steatosis. 4. Cholelithiasis. 5. Tiny hiatal hernia. 6. Degraded evaluation of the pelvis, secondary to beam hardening artifact from right hip arthroplasty. 7. Coronary artery atherosclerosis. Aortic Atherosclerosis (ICD10-I70.0). Electronically Signed   By: Jeronimo Greaves M.D.   On: 06/19/2020 17:00    Review of Systems  There were no vitals taken for this visit. Physical Exam   Assessment/Plan  Proceed as planned with cysto and BILATERAL ureteral stent placement. Goal today is left renal decompression and avoidance of right obstruction, then will need ureteroscopy (possibley staged) in elective setting to achieve stone free. Risks, benefits, alternatives, expected peri-op course disucssed. Rec observation post-op over night to verify no progression of infectious parameters before DC home.   Sebastian Ache, MD 06/21/2020, 1:37 PM

## 2020-06-21 NOTE — Transfer of Care (Signed)
Immediate Anesthesia Transfer of Care Note  Patient: Monique [name]  Procedure(s) Performed: CYSTOSCOPY WITH RETROGRADE PYELOGRAM, AND BILATERAL STENT PLACEMENT (Bilateral )  Patient Location: PACU  Anesthesia Type:General  Level of Consciousness: sedated  Airway & Oxygen Therapy: Patient Spontanous Breathing and Patient connected to face mask oxygen  Post-op Assessment: Report given to RN and Post -op Vital signs reviewed and stable  Post vital signs: Reviewed and stable  Last Vitals:  Vitals Value Taken Time  BP 134/61 06/21/20 1750  Temp    Pulse 100 06/21/20 1752  Resp 18 06/21/20 1752  SpO2 100 % 06/21/20 1752  Vitals shown include unvalidated device data.  Last Pain:  Vitals:   06/21/20 1649  PainSc: 0-No pain      Patients Stated Pain Goal: 4 (06/21/20 1649)  Complications: No complications documented.

## 2020-06-21 NOTE — Anesthesia Preprocedure Evaluation (Signed)
Anesthesia Evaluation  Patient identified by MRN, date of birth, ID band Patient awake    Reviewed: Allergy & Precautions, NPO status , Patient's Chart, lab work & pertinent test results  Airway Mallampati: II  TM Distance: >3 FB Neck ROM: Full    Dental no notable dental hx. (+) Teeth Intact, Dental Advisory Given   Pulmonary asthma , pneumonia,    Pulmonary exam normal breath sounds clear to auscultation       Cardiovascular hypertension, Pt. on medications + CAD  Normal cardiovascular exam Rhythm:Regular Rate:Normal  11/21/18 Echo Left Ventricle: The left ventricle has hyperdynamic systolic function,  with an ejection fraction of >65%. The cavity size was normal. There is no  increase in left ventricular wall thickness. No evidence of left  ventricular regional wall motion  abnormalities.    Neuro/Psych negative neurological ROS  negative psych ROS   GI/Hepatic Neg liver ROS, hiatal hernia,   Endo/Other  negative endocrine ROS  Renal/GU negative Renal ROSK+ 4.8      Musculoskeletal  (+) Arthritis ,   Abdominal   Peds  Hematology  (+) anemia , Hgb 14.5 WBC 12.4   Anesthesia Other Findings   Reproductive/Obstetrics                            Anesthesia Physical Anesthesia Plan  ASA: III and emergent  Anesthesia Plan: General   Post-op Pain Management:    Induction: Intravenous  PONV Risk Score and Plan: 4 or greater and Treatment may vary due to age or medical condition and Ondansetron  Airway Management Planned: LMA  Additional Equipment: None  Intra-op Plan:   Post-operative Plan:   Informed Consent: I have reviewed the patients History and Physical, chart, labs and discussed the procedure including the risks, benefits and alternatives for the proposed anesthesia with the patient or authorized representative who has indicated his/her understanding and acceptance.      Dental advisory given  Plan Discussed with: CRNA and Anesthesiologist  Anesthesia Plan Comments:         Anesthesia Quick Evaluation

## 2020-06-21 NOTE — Anesthesia Postprocedure Evaluation (Signed)
Anesthesia Post Note  Patient: Monique [name]  Procedure(s) Performed: CYSTOSCOPY WITH RETROGRADE PYELOGRAM, AND BILATERAL STENT PLACEMENT (Bilateral )     Patient location during evaluation: PACU Anesthesia Type: General Level of consciousness: awake and alert Pain management: pain level controlled Vital Signs Assessment: post-procedure vital signs reviewed and stable Respiratory status: spontaneous breathing, nonlabored ventilation, respiratory function stable and patient connected to nasal cannula oxygen Cardiovascular status: blood pressure returned to baseline and stable Postop Assessment: no apparent nausea or vomiting Anesthetic complications: no   No complications documented.  Last Vitals:  Vitals:   06/21/20 1750 06/21/20 1800  BP: 134/61 130/68  Pulse: (!) 101 95  Resp: (!) 23 19  Temp: 36.7 C   SpO2: 100% 100%    Last Pain:  Vitals:   06/21/20 1750  PainSc: 0-No pain                 Camari Wisham S

## 2020-06-21 NOTE — Anesthesia Procedure Notes (Signed)
Date/Time: 06/21/2020 5:40 PM Performed by: Minerva Ends, CRNA Oxygen Delivery Method: Simple face mask Placement Confirmation: positive ETCO2 and breath sounds checked- equal and bilateral Dental Injury: Teeth and Oropharynx as per pre-operative assessment

## 2020-06-21 NOTE — Anesthesia Procedure Notes (Signed)
Procedure Name: LMA Insertion Date/Time: 06/21/2020 5:16 PM Performed by: Minerva Ends, CRNA Pre-anesthesia Checklist: Patient identified, Emergency Drugs available, Suction available and Patient being monitored Patient Re-evaluated:Patient Re-evaluated prior to induction Oxygen Delivery Method: Circle System Utilized Preoxygenation: Pre-oxygenation with 100% oxygen Induction Type: IV induction Ventilation: Mask ventilation without difficulty LMA: LMA inserted and LMA with gastric port inserted LMA Size: 4.0 Tube size: 4.0 mm Number of attempts: 1 Placement Confirmation: positive ETCO2 Tube secured with: Tape Dental Injury: Teeth and Oropharynx as per pre-operative assessment  Comments: Smooth IV induction Rose-- intubation AM CRNA atraumatic-- teeth and mouth as prop -- bilat BS

## 2020-06-22 ENCOUNTER — Encounter (HOSPITAL_COMMUNITY): Payer: Self-pay | Admitting: Urology

## 2020-06-22 DIAGNOSIS — Z79899 Other long term (current) drug therapy: Secondary | ICD-10-CM | POA: Diagnosis not present

## 2020-06-22 DIAGNOSIS — Z880 Allergy status to penicillin: Secondary | ICD-10-CM | POA: Diagnosis not present

## 2020-06-22 DIAGNOSIS — R8271 Bacteriuria: Secondary | ICD-10-CM | POA: Diagnosis present

## 2020-06-22 DIAGNOSIS — Z96641 Presence of right artificial hip joint: Secondary | ICD-10-CM | POA: Diagnosis present

## 2020-06-22 DIAGNOSIS — N8110 Cystocele, unspecified: Secondary | ICD-10-CM | POA: Diagnosis present

## 2020-06-22 DIAGNOSIS — Z885 Allergy status to narcotic agent status: Secondary | ICD-10-CM | POA: Diagnosis not present

## 2020-06-22 DIAGNOSIS — J45909 Unspecified asthma, uncomplicated: Secondary | ICD-10-CM | POA: Diagnosis present

## 2020-06-22 DIAGNOSIS — I10 Essential (primary) hypertension: Secondary | ICD-10-CM | POA: Diagnosis present

## 2020-06-22 DIAGNOSIS — K449 Diaphragmatic hernia without obstruction or gangrene: Secondary | ICD-10-CM | POA: Diagnosis present

## 2020-06-22 DIAGNOSIS — I2510 Atherosclerotic heart disease of native coronary artery without angina pectoris: Secondary | ICD-10-CM | POA: Diagnosis present

## 2020-06-22 DIAGNOSIS — Z888 Allergy status to other drugs, medicaments and biological substances status: Secondary | ICD-10-CM | POA: Diagnosis not present

## 2020-06-22 DIAGNOSIS — Z951 Presence of aortocoronary bypass graft: Secondary | ICD-10-CM | POA: Diagnosis not present

## 2020-06-22 DIAGNOSIS — Z8249 Family history of ischemic heart disease and other diseases of the circulatory system: Secondary | ICD-10-CM | POA: Diagnosis not present

## 2020-06-22 DIAGNOSIS — R509 Fever, unspecified: Secondary | ICD-10-CM | POA: Diagnosis not present

## 2020-06-22 DIAGNOSIS — M1990 Unspecified osteoarthritis, unspecified site: Secondary | ICD-10-CM | POA: Diagnosis present

## 2020-06-22 DIAGNOSIS — D649 Anemia, unspecified: Secondary | ICD-10-CM | POA: Diagnosis present

## 2020-06-22 DIAGNOSIS — Z7982 Long term (current) use of aspirin: Secondary | ICD-10-CM | POA: Diagnosis not present

## 2020-06-22 DIAGNOSIS — E7800 Pure hypercholesterolemia, unspecified: Secondary | ICD-10-CM | POA: Diagnosis present

## 2020-06-22 DIAGNOSIS — Z20822 Contact with and (suspected) exposure to covid-19: Secondary | ICD-10-CM | POA: Diagnosis present

## 2020-06-22 DIAGNOSIS — Z955 Presence of coronary angioplasty implant and graft: Secondary | ICD-10-CM | POA: Diagnosis not present

## 2020-06-22 DIAGNOSIS — N132 Hydronephrosis with renal and ureteral calculous obstruction: Secondary | ICD-10-CM | POA: Diagnosis present

## 2020-06-22 DIAGNOSIS — Z933 Colostomy status: Secondary | ICD-10-CM | POA: Diagnosis not present

## 2020-06-22 MED ORDER — SODIUM CHLORIDE 0.9 % IV SOLN
2.0000 g | INTRAVENOUS | Status: DC
  Administered 2020-06-22 – 2020-06-23 (×2): 2 g via INTRAVENOUS
  Filled 2020-06-22: qty 2
  Filled 2020-06-22: qty 20
  Filled 2020-06-22: qty 2

## 2020-06-22 MED ORDER — GENTAMICIN SULFATE 40 MG/ML IJ SOLN: 5.0000 mg/kg | INTRAVENOUS | Status: DC

## 2020-06-22 NOTE — Progress Notes (Signed)
1 Day Post-Op   Subjective/Chief Complaint:  1 - Recurrent Urolithiasis - s/p bilateral JJ stents 06/21/20 for Lt ureteral / bilateral renal stones. Treated previously by Dr. Marlou Porch x several. CT 11/17 form ER with left 38mm UVJ stone with mild hydro but some perinephric stranding and L>R non-obstructing renal stones.   2 - Bacteruria - pt with some tachycardia to 100s, very mild maliase and scant bacteruria on office eval 11/19. UCX 11/19 pending. Placed on empiric gent.   Today "Monique [name]" is stable. Some low grade fevers overnight that have not been uptrending. In very good spirits. Looking forwards to watching football this afternoon and tomorrow.     Objective: Vital signs in last 24 hours: Temp:  [97.9 F (36.6 C)-99.9 F (37.7 C)] 98 F (36.7 C) (11/20 0810) Pulse Rate:  [80-101] 80 (11/20 0753) Resp:  [14-23] 14 (11/20 0753) BP: (130-162)/(59-81) 152/81 (11/20 0753) SpO2:  [95 %-100 %] 95 % (11/20 0753) Weight:  [82.7 kg] 82.7 kg (11/19 1948) Last BM Date: 06/20/20  Intake/Output from previous day: 11/19 0701 - 11/20 0700 In: 1331.2 [P.O.:240; I.V.:982.7; IV Piggyback:108.5] Out: 500 [Urine:500] Intake/Output this shift: Total I/O In: -  Out: 300 [Urine:300]  General appearance: alert, cooperative and Very pleasant. Very mentally spry for age.  Eyes: negative Nose: Nares normal. Septum midline. Mucosa normal. No drainage or sinus tenderness. Throat: lips, mucosa, and tongue normal; teeth and gums normal Neck: supple, symmetrical, trachea midline Back: symmetric, no curvature. ROM normal. No CVA tenderness. Resp: Non-labored on RA Cardio: HR 80s GI: soft, non-tender; bowel sounds normal; no masses,  no organomegaly Pelvic: external genitalia normal and no catheter Skin: Skin color, texture, turgor normal. No rashes or lesions Neurologic: Grossly normal  Lab Results:  Recent Labs    06/19/20 1434  WBC 12.4*  HGB 14.5  HCT 44.1  PLT 176   BMET Recent Labs     06/19/20 1434  NA 141  K 4.8  CL 106  CO2 26  GLUCOSE 150*  BUN 20  CREATININE 0.67  CALCIUM 8.9   PT/INR No results for input(s): LABPROT, INR in the last 72 hours. ABG No results for input(s): PHART, HCO3 in the last 72 hours.  Invalid input(s): PCO2, PO2  Studies/Results: DG C-Arm 1-60 Min-No Report  Result Date: 06/21/2020 Fluoroscopy was utilized by the requesting physician.  No radiographic interpretation.    Anti-infectives: Anti-infectives (From admission, onward)   Start     Dose/Rate Route Frequency Ordered Stop   06/21/20 1530  gentamicin (GARAMYCIN) 340 mg in dextrose 5 % 100 mL IVPB       Note to Pharmacy: PER PHARMACY DOSING   5 mg/kg  67.2 kg (Adjusted) 108.5 mL/hr over 60 Minutes Intravenous  Once 06/21/20 1526 06/21/20 1717      Assessment/Plan:  Continue ABX (rocephin per pharmacy guidance, appreciate), remain in house. Likely DC home tomorrow as long as no high grade fevers.   Monique [name] 06/22/2020

## 2020-06-23 LAB — URINE CULTURE: Culture: NO GROWTH

## 2020-06-23 MED ORDER — DOCUSATE SODIUM 100 MG PO CAPS: 100.0000 mg | ORAL_CAPSULE | Freq: Every day | ORAL | 0 refills | Status: DC | PRN

## 2020-06-23 MED ORDER — OXYCODONE-ACETAMINOPHEN 5-325 MG PO TABS
1.0000 | ORAL_TABLET | ORAL | 0 refills | Status: AC | PRN
Start: 2020-06-23 — End: 2020-06-29

## 2020-06-23 MED ORDER — CEPHALEXIN 500 MG PO CAPS: 500.0000 mg | ORAL_CAPSULE | Freq: Two times a day (BID) | ORAL | 0 refills | Status: AC

## 2020-06-23 NOTE — Discharge Summary (Signed)
Date of admission: 06/21/2020  Date of discharge: 06/23/2020  Admission diagnosis: Recurrent urolithiasis  Discharge diagnosis: Same  Secondary diagnoses: Bacteriuria  History and Physical: For full details, please see admission history and physical. Briefly, Monique [name] is a 79 y.o. year old patient with CT A/P 11/17 with less than 1 mm with mild hydronephrosis bilateral renal stones.   Hospital Course: She underwent bilateral ureteral stents on 06/21/2020. the patient recovered in the usual expected fashion.  She had her diet advanced slowly.  Initially managed with IV pain control, then transitioned to PO meds when she was tolerating oral intake.  Her labs were stable throughout the hospital course.  She was discharged to home on POD#2.  At the time of discharge she was tolerating a regular diet, passing flatus, ambulating, had adequate pain control and was agreeable to discharge.  Follow up as scheduled.    Laboratory values: No results for input(s): HGB, HCT in the last 72 hours. No results for input(s): CREATININE in the last 72 hours.  Disposition: Home  Discharge instruction: The patient was instructed to be ambulatory but told to refrain from heavy lifting, strenuous activity, or driving.   Discharge medications:  Allergies as of 06/23/2020      Reactions   Morphine Other (See Comments)   Penicillins Other (See Comments)   UNSPECIFIED REACTION  Unknown reaction as a child.  Tolerated Zosyn 2017.   Pregabalin Anxiety      Medication List    TAKE these medications   aspirin EC 81 MG tablet Take 81 mg by mouth daily.   atorvastatin 80 MG tablet Commonly known as: LIPITOR TAKE 1 TABLET BY MOUTH AT DAILY 6PM What changed: See the new instructions.   carvedilol 6.25 MG tablet Commonly known as: COREG TAKE 1 TABLET(6.25 MG) BY MOUTH TWICE DAILY WITH A MEAL What changed: See the new instructions.   cephALEXin 500 MG capsule Commonly known as: KEFLEX Take 1  capsule (500 mg total) by mouth 2 (two) times daily for 7 days.   cetirizine 10 MG tablet Commonly known as: ZYRTEC Take 10 mg by mouth at bedtime.   clopidogrel 75 MG tablet Commonly known as: PLAVIX Take 1 tablet (75 mg total) by mouth daily. On hold   docusate sodium 100 MG capsule Commonly known as: Colace Take 1 capsule (100 mg total) by mouth daily as needed for up to 30 doses.   DULoxetine 60 MG capsule Commonly known as: CYMBALTA Take 60 mg by mouth daily.   DULoxetine 30 MG capsule Commonly known as: CYMBALTA Take 30 mg by mouth daily.   lisinopril 40 MG tablet Commonly known as: ZESTRIL Take 40 mg by mouth daily.   MAGNESIUM OXIDE PO Take 125 mg by mouth daily.   oxyCODONE-acetaminophen 5-325 MG tablet Commonly known as: Percocet Take 1 tablet by mouth every 4 (four) hours as needed for up to 6 days for severe pain. What changed:   how much to take  reasons to take this   pantoprazole 40 MG tablet Commonly known as: PROTONIX Take 1 tablet (40 mg total) by mouth 2 (two) times daily before a meal.   polyethylene glycol 17 g packet Commonly known as: MIRALAX / GLYCOLAX Take 17 g by mouth 2 (two) times daily. What changed:   when to take this  reasons to take this   Prenatal Multivitamin + DHA 28-0.8 & 200 MG Misc Take 1 tablet by mouth daily.   Restasis 0.05 % ophthalmic emulsion Generic  drug: cycloSPORINE Place 1 drop into both eyes 2 (two) times daily.   tamsulosin 0.4 MG Caps capsule Commonly known as: FLOMAX 1 q HS to aid stone passage What changed:   how much to take  how to take this  when to take this  additional instructions   Ventolin HFA 108 (90 Base) MCG/ACT inhaler Generic drug: albuterol Inhale 2 puffs into the lungs every 4 (four) hours as needed for wheezing or shortness of breath.   vitamin B-12 1000 MCG tablet Commonly known as: CYANOCOBALAMIN Take 1,000 mcg by mouth daily.   VITAMIN D PO Take 1,000 Units by mouth  daily.       Followup:   Follow-up Information    [facility]. Call in 2 weeks.   Why: Call for appt Contact information: [address] [place] [address] [phone]              Matt R. Karima Carrell MD [facility]  Pager: [phone]

## 2020-06-23 NOTE — Progress Notes (Signed)
2 Days Post-Op Subjective: Pain controlled.  No nausea or vomiting.  Passing flatus.  Tolerating diet.  Voiding without difficulty.  Afebrile.  Objective: Vital signs in last 24 hours: Temp:  [97.7 F (36.5 C)-98.5 F (36.9 C)] 97.7 F (36.5 C) (11/21 0509) Pulse Rate:  [80-92] 83 (11/21 0509) Resp:  [14-20] 18 (11/21 0509) BP: (152-182)/(76-83) 168/79 (11/21 0509) SpO2:  [95 %-98 %] 96 % (11/21 0509)  Intake/Output from previous day: 11/20 0701 - 11/21 0700 In: 585.8 [I.V.:528.8; IV Piggyback:57] Out: 800 [Urine:800] Intake/Output this shift: No intake/output data recorded.   UOP:   Physical Exam:  General: Alert and oriented CV: RRR Lungs: Clear Abdomen: Soft, ND, NT Ext: NT, No erythema  Lab Results: No results for input(s): HGB, HCT in the last 72 hours. BMET No results for input(s): NA, K, CL, CO2, GLUCOSE, BUN, CREATININE, CALCIUM in the last 72 hours.   Studies/Results: DG C-Arm 1-60 Min-No Report  Result Date: 06/21/2020 Fluoroscopy was utilized by the requesting physician.  No radiographic interpretation.    Assessment/Plan: 1. Left ureteral and bilateral renal stones: CT A/P 11/17 with left 4 mm UVJ stone with mild hydro and bilateral nonobstructing renal stones.  S/p bilateral ureteral stent on 06/21/2020 2. Bacteriuria: In office on 08/27/2017.  Urine culture 11/19.  Continues on Rocephin  -Pain control -Diet as tolerated -Transition to p.o. antibiotics outpatient -Follow-up outpatient with primary treatment of his abdomen.  Message schedulers to arrange. -Discharge home today   LOS: 1 day   Jannifer Hick 06/23/2020, 7:22 AM Matt R. Dessa Ledee MD [facility]  Pager: [phone]

## 2020-06-23 NOTE — Discharge Instructions (Signed)
   Activity:  You are encouraged to ambulate frequently (about every hour during waking hours) to help prevent blood clots from forming in your legs or lungs.  However, you should not engage in any heavy lifting (> 10-15 lbs), strenuous activity, or straining.   Diet: You should advance your diet as instructed by your physician.  It will be normal to have some bloating, nausea, and abdominal discomfort intermittently.   Prescriptions:  You will be provided a prescription for pain medication to take as needed.  If your pain is not severe enough to require the prescription pain medication, you may take extra strength Tylenol instead which will have less side effects.  You should also take a prescribed stool softener to avoid straining with bowel movements as the prescription pain medication may constipate you.   What to call us about: You should call the office [phone]) if you develop fever > 101 or develop persistent vomiting. Activity:  You are encouraged to ambulate frequently (about every hour during waking hours) to help prevent blood clots from forming in your legs or lungs.  However, you should not engage in any heavy lifting (> 10-15 lbs), strenuous activity, or straining.  You have bilateral ureteral stents draining.  These are temporary.  Follow-up in the office for definitive treatment of your stones.

## 2020-06-23 NOTE — Plan of Care (Signed)
GOC met for discharge. Reviewed discharge information with patient and son. Answered all questions. Patient/caregiver able to teach back medications and reasons to contact MD/911. Patient verbalizes importance of PCP follow up appointment.   Barbee Shropshire. Brigitte Pulse, RN

## 2020-06-24 NOTE — Op Note (Signed)
Monique [name], Monique MEDICAL RECORD [id number] ACCOUNT [id number] DATE OF BIRTH:Jan 20, 1941 FACILITY: [facility] LOCATION: [facility] PHYSICIAN:Monique Mims, MD  OPERATIVE REPORT  DATE OF PROCEDURE:  06/21/2020  PREOPERATIVE DIAGNOSIS:  Left ureteral, bilateral renal stones; bacteriuria.  PROCEDURES: 1.  Cystoscopy, bilateral pyelograms interpretation. 2.  Insertion of bilateral ureteral stents, 5 x 24 Polaris, no tether.  ESTIMATED BLOOD LOSS:  Nil.  COMPLICATIONS:  None.  SPECIMENS:  Left renal pelvis urine for Gram stain and culture.  FINDINGS: 1.  Cystocele. 2.  Moderate left hydronephrosis. 3.  Successful placement of bilateral ureteral stents.  INDICATIONS:  The patient is a pleasant 79 year old woman who was found on workup for colicky flank pain to have a left ureteral stone.  She also has a large volume bilateral renal stones.  She was given a brief trial of medical therapy.  She  re-presented to the office today with difficult to control colic, but more concerning some tachycardia and bacteriuria.  Overall, picture was worrisome for impending obstructing pyelonephritis.  Options were discussed for management including recommended  path of urgent renal decompression with stenting followed by observation admission to verify no progression of infectious parameters and then definitive stone management in a staged fashion in the elective setting.  She wished to proceed with  decompression with stents today.  Informed consent was obtained and placed in medical record.  DESCRIPTION OF PROCEDURE:  The patient being Monique [name] identified, procedure being cystoscopy and bilateral stent placement confirmed.  Procedure timeout was performed.  Intravenous antibiotics were administered.  General anesthesia was induced.   The patient was placed into a low lithotomy position.  Sterile field was created by prepping and draping the vagina, introitus, and proximal thighs using iodine.   Cystourethroscopy was performed using 21-French rigid cystoscope with offset lens.  Inspection of  bladder revealed no diverticula, calcifications, or papillary lesions.  There was a significant cystocele noted.  The trigone was somewhat medialized and very inferior.  With some difficulty, the left ureteral orifice was cannulated with a 6-French  open-ended catheter and left retrograde pyelogram was obtained.  Left retrograde pyelogram revealed a single left ureter, single system left kidney.  There was a filling defect in the midureter consistent with known stone.  There was proximal hydronephrosis with some tortuosity above this.  A 0.03 ZIPwire was advanced  to the level of the renal pelvis.  The open-ended catheter was advanced to this level and a sample of the renal pelvis urine was obtained.  This was mildly proteinaceous and set aside for Gram stain and culture.  The ZIPwire was once again advanced, and  a new 5 x 24 Polaris stent was placed using cystoscopic and fluoroscopic guidance.  Good proximal coil was noted.  Notably, due to the unusual angulation of the trigone, the distal stent was deployed into her distal most ureter.  This was well above the  area of stone.  Similarly, right retrograde pyelogram was obtained via the 6-French open-ended catheter.  Right retrograde pyelogram revealed a single right ureter, single system right kidney.  No obvious filling defects or narrowing noted at this time.  A ZIPwire was similarly advanced to the lower pole, and a new 5 x 24 Polaris stent was placed using  cystoscopic and fluoroscopic guidance.  Similarly, given the unusual angulation of her trigone, the distal stent was deployed in the distal most ureter.  The patient remained stable throughout the procedure without any progression of her mild SIRS.   It  was  not felt that catheterization would be warranted.  As such, bladder was emptied per cystoscope.  Procedure was terminated.  The patient  tolerated the procedure well.  No immediate perioperative complications.  The patient was taken to postanesthesia  care in stable condition with plan for observation admission likely discharge home tomorrow as long as she remains afebrile.  IN/NUANCE  D:06/21/2020 T:06/22/2020 JOB:[id number]

## 2020-06-26 ENCOUNTER — Telehealth: Payer: Self-pay | Admitting: Cardiovascular Disease

## 2020-06-26 ENCOUNTER — Other Ambulatory Visit: Payer: Self-pay | Admitting: Urology

## 2020-06-26 NOTE — Telephone Encounter (Signed)
   [facility] Pre-operative Risk Assessment    HEARTCARE STAFF: - Please ensure there is not already an duplicate clearance open for this procedure. - Under Visit Info/Reason for Call, type in Other and utilize the format Clearance MM/DD/YY or Clearance TBD. Do not use dashes or single digits. - If request is for dental extraction, please clarify the # of teeth to be extracted.  Request for surgical clearance:  1. What type of surgery is being performed? Bilateral ureteroscopy with laser lithotripsy    2. When is this surgery scheduled? 07/12/20   3. What type of clearance is required (medical clearance vs. Pharmacy clearance to hold med vs. Both)? Both  4. Are there any medications that need to be held prior to surgery and how long? Hold aspirin 5 days, hold plavix 5-7 days   5. Practice name and name of physician performing surgery? [facility], Dr. Louis Meckel   6. What is the office phone number? [phone]   7.   What is the office fax number? [phone]  8.   Anesthesia type (None, local, MAC, general) ? general   Monique [name] 06/26/2020, 4:24 PM  _________________________________________________________________   (provider comments below)

## 2020-06-26 NOTE — Telephone Encounter (Signed)
Hi Dr. Allyson Sabal,  Ms. Monique [name] has bilateral ureterscopy with laser lithotripsy scheduled for 07/12/2020 and is being asked to hold Aspirin and Plavix. 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 Patient was last seen by [facility] in 04/2020 at which time she noted 2 episodes of chest pain since prior visit that resolved with Nitro. Episodes sounds atypical in that they started in her abdomen and radiated to her chest. Can you please comment on if patient can hold Aspirin for 5 days and Plavix for 5-7 days prior to procedure?  Please route response back to P CV DIV PREOP.  Thank you! Jaslynn Thome

## 2020-06-27 NOTE — Telephone Encounter (Signed)
OK to hold anti platelet drugs for Urology procedure

## 2020-06-28 ENCOUNTER — Other Ambulatory Visit: Payer: Self-pay | Admitting: Cardiovascular Disease

## 2020-07-01 NOTE — Telephone Encounter (Signed)
   Primary Cardiologist: [name], MD  Chart reviewed as part of pre-operative protocol coverage. Patient was contacted 07/01/2020 in reference to pre-operative risk assessment for pending surgery as outlined below.  Monique [name] was last seen on 04/10/20 by Fannie Knee for Dr Allyson Sabal for CAD with hx CABG in 2020 and asymptomatic sinus tachycardia.  Since that day, Monique [name] has done well without chest pain or SOB. She meets 4 METS of activity without angina.  She had cysto with stents placed in Nov 2021.    Therefore, based on ACC/AHA guidelines, the patient would be at acceptable risk for the planned procedure without further cardiovascular testing.   She may hold asprin for 5 days and plavix for 5-7 days prior to procedure and resume as soon as safe post procedure.    The patient was advised that if she develops new symptoms prior to surgery to contact our office to arrange for a follow-up visit, and she verbalized understanding.  I will route this recommendation to the requesting party via Epic fax function and remove from pre-op pool. Please call with questions.  Nada Boozer, NP 07/01/2020, 10:29 AM

## 2020-07-09 NOTE — Progress Notes (Addendum)
COVID Vaccine Completed:  x3 Date COVID Vaccine completed:  08-22-19 &  09-11-19 and Booster 05-13-20 COVID vaccine manufacturer: [employer]   PCP - [name], FNP Cardiologist - [name], MD  Cardiac clearance on chart dated   Chest x-ray -  EKG - 06-19-20 in Epic Stress Test -  ECHO - 11-21-18 in Epic   Cardiac Cath - 11-17-18 in Epic Pacemaker/ICD device last checked:  Sleep Study - N/A CPAP -   Fasting Blood Sugar - N/A Checks Blood Sugar _____ times a day  Blood Thinner Instructions:  Plavix 75, ok to hold 5-7 days prior per clearance dated 07-01-20. Aspirin Instructions: ASA 81 mg, ok to hold 5 days prior per clearance Last Dose:  Anesthesia review: CAD,  Hx of CABG 11/2018, HTN  Patient denies shortness of breath, fever, cough and chest pain at PAT appointment.  Pt able to climb a flight of stairs and perform ADLs without assistance.    Patient verbalized understanding of instructions that were given to them at the PAT appointment. Patient was also instructed that they will need to review over the PAT instructions again at home before surgery.

## 2020-07-09 NOTE — Patient Instructions (Addendum)
DUE TO COVID-19 ONLY ONE VISITOR IS ALLOWED TO COME WITH YOU AND STAY IN THE WAITING ROOM ONLY DURING PRE OP AND PROCEDURE.   IF YOU WILL BE ADMITTED INTO THE HOSPITAL YOU ARE ALLOWED ONE SUPPORT PERSON DURING VISITATION HOURS ONLY (10AM -8PM)   . The support person may change daily. . The support person must pass our screening, gel in and out, and wear a mask at all times, including in the patient's room. . Patients must also wear a mask when staff or their support person are in the room.   COVID SWAB TESTING MUST BE COMPLETED ON:  Wed, 07-10-20 @ 2:45 PM   [address] [place], [place] [address]  (Must self quarantine after testing. Follow instructions on handout.)        Your procedure is scheduled on:   Friday, 07-12-20   Report to [facility] Main  Entrance    Report to admitting at 7:50 AM   Call this number if you have problems the morning of surgery [phone]   Do not eat food :After Midnight.   May have liquids until 6:50 AM day of surgery  CLEAR LIQUID DIET  Foods Allowed                                                                     Foods Excluded  Water, Black Coffee and tea, regular and decaf              liquids that you cannot  Plain Jell-O in any flavor  (No red)                                     see through such as: Fruit ices (not with fruit pulp)                                      milk, soups, orange juice              Iced Popsicles (No red)                                      All solid food                                   Apple juices Sports drinks like Gatorade (No red) Lightly seasoned clear broth or consume(fat free) Sugar, honey syrup    Oral Hygiene is also important to reduce your risk of infection.                                    Remember - BRUSH YOUR TEETH THE MORNING OF SURGERY WITH YOUR REGULAR TOOTHPASTE   Do NOT smoke after Midnight   Take these medicines the morning of surgery with A SIP OF WATER:   Atorvastatin,  Carvedilol, Cymbalta, Protonix, okay to use inhalers  You may not have any metal on your body including hair pins, jewelry, and body piercings             Do not wear make-up, lotions, powders, perfumes/cologne, or deodorant             Do not wear nail polish.  Do not shave  48 hours prior to surgery.            Do not bring valuables to the hospital. [facility] IS NOT RESPONSIBLE   FOR VALUABLES.   Contacts, dentures or bridgework may not be worn into surgery.   Patients discharged the day of surgery will not be allowed to drive home.                Please read over the following fact sheets you were given: IF YOU HAVE QUESTIONS ABOUT YOUR PRE OP INSTRUCTIONS PLEASE CALL [phone]   [facility] - Preparing for Surgery Before surgery, you can play an important role.  Because skin is not sterile, your skin needs to be as free of germs as possible.  You can reduce the number of germs on your skin by washing with CHG (chlorahexidine gluconate) soap before surgery.  CHG is an antiseptic cleaner which kills germs and bonds with the skin to continue killing germs even after washing. Please DO NOT use if you have an allergy to CHG or antibacterial soaps.  If your skin becomes reddened/irritated stop using the CHG and inform your nurse when you arrive at Short Stay. Do not shave (including legs and underarms) for at least 48 hours prior to the first CHG shower.  You may shave your face/neck.  Please follow these instructions carefully:  1.  Shower with CHG Soap the night before surgery and the  morning of surgery.  2.  If you choose to wash your hair, wash your hair first as usual with your normal  shampoo.  3.  After you shampoo, rinse your hair and body thoroughly to remove the shampoo.                             4.  Use CHG as you would any other liquid soap.  You can apply chg directly to the skin and wash.  Gently with a scrungie or clean washcloth.  5.   Apply the CHG Soap to your body ONLY FROM THE NECK DOWN.   Do   not use on face/ open                           Wound or open sores. Avoid contact with eyes, ears mouth and   genitals (private parts).                       Wash face,  Genitals (private parts) with your normal soap.             6.  Wash thoroughly, paying special attention to the area where your    surgery  will be performed.  7.  Thoroughly rinse your body with warm water from the neck down.  8.  DO NOT shower/wash with your normal soap after using and rinsing off the CHG Soap.                9.  Pat yourself dry with a clean towel.  10.  Wear clean pajamas.            11.  Place clean sheets on your bed the night of your first shower and do not  sleep with pets. Day of Surgery : Do not apply any lotions/deodorants the morning of surgery.  Please wear clean clothes to the hospital/surgery center.  FAILURE TO FOLLOW THESE INSTRUCTIONS MAY RESULT IN THE CANCELLATION OF YOUR SURGERY  PATIENT SIGNATURE_________________________________  NURSE SIGNATURE__________________________________  ________________________________________________________________________

## 2020-07-10 ENCOUNTER — Other Ambulatory Visit: Payer: Self-pay

## 2020-07-10 ENCOUNTER — Encounter (HOSPITAL_COMMUNITY)
Admission: RE | Admit: 2020-07-10 | Discharge: 2020-07-10 | Disposition: A | Discharge status: Home / Self Care | Payer: Medicare Other | Source: Ambulatory Visit | Attending: Urology | Admitting: Urology

## 2020-07-10 ENCOUNTER — Other Ambulatory Visit (HOSPITAL_COMMUNITY)
Admission: RE | Admit: 2020-07-10 | Discharge: 2020-07-10 | Disposition: A | Discharge status: Home / Self Care | Payer: Medicare Other | Source: Ambulatory Visit | Attending: Urology | Admitting: Urology

## 2020-07-10 ENCOUNTER — Encounter (HOSPITAL_COMMUNITY): Payer: Self-pay

## 2020-07-10 DIAGNOSIS — Z20822 Contact with and (suspected) exposure to covid-19: Secondary | ICD-10-CM | POA: Diagnosis not present

## 2020-07-10 DIAGNOSIS — Z01812 Encounter for preprocedural laboratory examination: Secondary | ICD-10-CM | POA: Diagnosis not present

## 2020-07-10 LAB — CBC
HCT: 39.2 % (ref 36.0–46.0)
Hemoglobin: 12.7 g/dL (ref 12.0–15.0)
MCH: 30.9 pg (ref 26.0–34.0)
MCHC: 32.4 g/dL (ref 30.0–36.0)
MCV: 95.4 fL (ref 80.0–100.0)
Platelets: 216 10*3/uL (ref 150–400)
RBC: 4.11 MIL/uL (ref 3.87–5.11)
RDW: 13.2 % (ref 11.5–15.5)
WBC: 6.5 10*3/uL (ref 4.0–10.5)
nRBC: 0 % (ref 0.0–0.2)

## 2020-07-10 LAB — BASIC METABOLIC PANEL
Anion gap: 10 (ref 5–15)
BUN: 24 mg/dL — ABNORMAL HIGH (ref 8–23)
CO2: 25 mmol/L (ref 22–32)
Calcium: 9.3 mg/dL (ref 8.9–10.3)
Chloride: 103 mmol/L (ref 98–111)
Creatinine, Ser: 0.64 mg/dL (ref 0.44–1.00)
GFR, Estimated: >60 mL/min (ref >60)
Glucose, Bld: 100 mg/dL — ABNORMAL HIGH (ref 70–99)
Potassium: 4.4 mmol/L (ref 3.5–5.1)
Sodium: 138 mmol/L (ref 135–145)

## 2020-07-10 LAB — SARS CORONAVIRUS 2 (TAT 6-24 HRS): SARS Coronavirus 2: NEGATIVE

## 2020-07-11 NOTE — Anesthesia Preprocedure Evaluation (Addendum)
Anesthesia Evaluation  Patient identified by MRN, date of birth, ID band Patient awake    Reviewed: Allergy & Precautions, H&P , NPO status , Patient's Chart, lab work & pertinent test results, reviewed documented beta blocker date and time   Airway Mallampati: III  TM Distance: >3 FB Neck ROM: Full    Dental no notable dental hx. (+) Teeth Intact, Dental Advisory Given   Pulmonary asthma ,    Pulmonary exam normal breath sounds clear to auscultation       Cardiovascular Exercise Tolerance: Good hypertension, Pt. on medications and Pt. on home beta blockers + CAD   Rhythm:Regular Rate:Normal     Neuro/Psych negative neurological ROS  negative psych ROS   GI/Hepatic Neg liver ROS, hiatal hernia,   Endo/Other  negative endocrine ROS  Renal/GU Renal disease  negative genitourinary   Musculoskeletal   Abdominal   Peds  Hematology  (+) Blood dyscrasia, anemia ,   Anesthesia Other Findings   Reproductive/Obstetrics negative OB ROS                           Anesthesia Physical Anesthesia Plan  ASA: III  Anesthesia Plan: General   Post-op Pain Management:    Induction: Intravenous  PONV Risk Score and Plan: 4 or greater and Ondansetron, Dexamethasone and Treatment may vary due to age or medical condition  Airway Management Planned: LMA  Additional Equipment:   Intra-op Plan:   Post-operative Plan: Extubation in OR  Informed Consent: I have reviewed the patients History and Physical, chart, labs and discussed the procedure including the risks, benefits and alternatives for the proposed anesthesia with the patient or authorized representative who has indicated his/her understanding and acceptance.     Dental advisory given  Plan Discussed with: CRNA  Anesthesia Plan Comments: (Per cardiology 07/01/20, "Chart reviewed as part of pre-operative protocol coverage. Patient was  contacted 07/01/2020 in reference to pre-operative risk assessment for pending surgery as outlined below.  [name] was last seen on 04/10/20 by Fannie Knee for Dr Allyson Sabal for CAD with hx CABG in 2020 and asymptomatic sinus tachycardia.  Since that day, ZAMORAH AILES has done well without chest pain or SOB. She meets 4 METS of activity without angina.  She had cysto with stents placed in Nov 2021.   Therefore, based on ACC/AHA guidelines, the patient would be at acceptable risk for the planned procedure without further cardiovascular testing.  She may hold asprin for 5 days and plavix for 5-7 days prior to procedure and resume as soon as safe post procedure." )       Anesthesia Quick Evaluation

## 2020-07-12 ENCOUNTER — Ambulatory Visit (HOSPITAL_COMMUNITY): Payer: Medicare Other | Admitting: Certified Registered Nurse Anesthetist

## 2020-07-12 ENCOUNTER — Ambulatory Visit (HOSPITAL_COMMUNITY): Payer: Medicare Other | Admitting: Physician Assistant

## 2020-07-12 ENCOUNTER — Encounter (HOSPITAL_COMMUNITY): Payer: Self-pay | Admitting: Urology

## 2020-07-12 ENCOUNTER — Ambulatory Visit (HOSPITAL_COMMUNITY): Payer: Medicare Other

## 2020-07-12 ENCOUNTER — Ambulatory Visit (HOSPITAL_COMMUNITY)
Admission: RE | Admit: 2020-07-12 | Discharge: 2020-07-12 | Disposition: A | Discharge status: Home / Self Care | Payer: Medicare Other | Attending: Urology | Admitting: Urology

## 2020-07-12 ENCOUNTER — Encounter (HOSPITAL_COMMUNITY)
Admission: RE | Disposition: A | Discharge status: Home / Self Care | Payer: Self-pay | Source: Home / Self Care | Attending: Urology

## 2020-07-12 DIAGNOSIS — N132 Hydronephrosis with renal and ureteral calculous obstruction: Secondary | ICD-10-CM

## 2020-07-12 DIAGNOSIS — Z79899 Other long term (current) drug therapy: Secondary | ICD-10-CM | POA: Diagnosis not present

## 2020-07-12 DIAGNOSIS — N200 Calculus of kidney: Secondary | ICD-10-CM | POA: Diagnosis present

## 2020-07-12 DIAGNOSIS — Z7902 Long term (current) use of antithrombotics/antiplatelets: Secondary | ICD-10-CM | POA: Insufficient documentation

## 2020-07-12 DIAGNOSIS — Z882 Allergy status to sulfonamides status: Secondary | ICD-10-CM | POA: Diagnosis not present

## 2020-07-12 DIAGNOSIS — Z7982 Long term (current) use of aspirin: Secondary | ICD-10-CM | POA: Diagnosis not present

## 2020-07-12 DIAGNOSIS — Z880 Allergy status to penicillin: Secondary | ICD-10-CM | POA: Insufficient documentation

## 2020-07-12 DIAGNOSIS — Z96649 Presence of unspecified artificial hip joint: Secondary | ICD-10-CM | POA: Diagnosis not present

## 2020-07-12 HISTORY — PX: CYSTOSCOPY WITH RETROGRADE PYELOGRAM, URETEROSCOPY AND STENT PLACEMENT: SHX5789

## 2020-07-12 HISTORY — PX: HOLMIUM LASER APPLICATION: SHX5852

## 2020-07-12 SURGERY — CYSTOURETEROSCOPY, WITH RETROGRADE PYELOGRAM AND STENT INSERTION
Anesthesia: General | Laterality: Bilateral

## 2020-07-12 MED ORDER — ACETAMINOPHEN 10 MG/ML IV SOLN
INTRAVENOUS | Status: AC
  Filled 2020-07-12: qty 100

## 2020-07-12 MED ORDER — SODIUM CHLORIDE 0.9 % IR SOLN
Status: DC | PRN
  Administered 2020-07-12: 1000 mL

## 2020-07-12 MED ORDER — MIDAZOLAM HCL 5 MG/5ML IJ SOLN
INTRAMUSCULAR | Status: DC | PRN
  Administered 2020-07-12 (×2): 1 mg via INTRAVENOUS

## 2020-07-12 MED ORDER — BELLADONNA ALKALOIDS-OPIUM 16.2-60 MG RE SUPP
RECTAL | Status: DC | PRN
  Administered 2020-07-12: 1 via RECTAL

## 2020-07-12 MED ORDER — DEXAMETHASONE SODIUM PHOSPHATE 10 MG/ML IJ SOLN
INTRAMUSCULAR | Status: AC
  Filled 2020-07-12: qty 1

## 2020-07-12 MED ORDER — SODIUM CHLORIDE 0.9 % IR SOLN
Status: DC | PRN
  Administered 2020-07-12: 6000 mL via INTRAVESICAL

## 2020-07-12 MED ORDER — LIDOCAINE 2% (20 MG/ML) 5 ML SYRINGE
INTRAMUSCULAR | Status: DC | PRN
  Administered 2020-07-12: 60 mg via INTRAVENOUS

## 2020-07-12 MED ORDER — CHLORHEXIDINE GLUCONATE 0.12 % MT SOLN: 15.0000 mL | Freq: Once | OROMUCOSAL | Status: AC

## 2020-07-12 MED ORDER — LACTATED RINGERS IV SOLN
INTRAVENOUS | Status: DC
  Administered 2020-07-12: 1000 mL via INTRAVENOUS

## 2020-07-12 MED ORDER — ACETAMINOPHEN 500 MG PO TABS: 1000.0000 mg | ORAL_TABLET | Freq: Once | ORAL | Status: DC

## 2020-07-12 MED ORDER — LIDOCAINE HCL (PF) 2 % IJ SOLN
INTRAMUSCULAR | Status: AC
  Filled 2020-07-12: qty 5

## 2020-07-12 MED ORDER — ACETAMINOPHEN 10 MG/ML IV SOLN
INTRAVENOUS | Status: DC | PRN
  Administered 2020-07-12: 1000 mg via INTRAVENOUS

## 2020-07-12 MED ORDER — ONDANSETRON HCL 4 MG/2ML IJ SOLN
INTRAMUSCULAR | Status: AC
  Filled 2020-07-12: qty 2

## 2020-07-12 MED ORDER — FENTANYL CITRATE (PF) 100 MCG/2ML IJ SOLN
INTRAMUSCULAR | Status: AC
  Filled 2020-07-12: qty 2

## 2020-07-12 MED ORDER — MIDAZOLAM HCL 2 MG/2ML IJ SOLN
INTRAMUSCULAR | Status: AC
  Filled 2020-07-12: qty 2

## 2020-07-12 MED ORDER — PHENYLEPHRINE 40 MCG/ML (10ML) SYRINGE FOR IV PUSH (FOR BLOOD PRESSURE SUPPORT)
PREFILLED_SYRINGE | INTRAVENOUS | Status: AC
  Filled 2020-07-12: qty 10

## 2020-07-12 MED ORDER — SODIUM CHLORIDE 0.9 % IV SOLN
INTRAVENOUS | Status: DC | PRN
  Administered 2020-07-12: 20 mL

## 2020-07-12 MED ORDER — ONDANSETRON HCL 4 MG/2ML IJ SOLN
INTRAMUSCULAR | Status: DC | PRN
  Administered 2020-07-12: 4 mg via INTRAVENOUS

## 2020-07-12 MED ORDER — PHENAZOPYRIDINE HCL 200 MG PO TABS: 200.0000 mg | ORAL_TABLET | Freq: Three times a day (TID) | ORAL | 0 refills | Status: DC | PRN

## 2020-07-12 MED ORDER — CIPROFLOXACIN IN D5W 400 MG/200ML IV SOLN
400.0000 mg | INTRAVENOUS | Status: AC
  Administered 2020-07-12: 400 mg via INTRAVENOUS
  Filled 2020-07-12: qty 200

## 2020-07-12 MED ORDER — PROPOFOL 10 MG/ML IV BOLUS
INTRAVENOUS | Status: DC | PRN
  Administered 2020-07-12: 110 mg via INTRAVENOUS
  Administered 2020-07-12: 50 mg via INTRAVENOUS
  Administered 2020-07-12: 40 mg via INTRAVENOUS

## 2020-07-12 MED ORDER — ORAL CARE MOUTH RINSE
15.0000 mL | Freq: Once | OROMUCOSAL | Status: AC
  Administered 2020-07-12: 15 mL via OROMUCOSAL

## 2020-07-12 MED ORDER — PHENYLEPHRINE 40 MCG/ML (10ML) SYRINGE FOR IV PUSH (FOR BLOOD PRESSURE SUPPORT)
PREFILLED_SYRINGE | INTRAVENOUS | Status: DC | PRN
  Administered 2020-07-12 (×2): 80 ug via INTRAVENOUS

## 2020-07-12 MED ORDER — PROPOFOL 10 MG/ML IV BOLUS
INTRAVENOUS | Status: AC
  Filled 2020-07-12: qty 20

## 2020-07-12 MED ORDER — DEXAMETHASONE SODIUM PHOSPHATE 10 MG/ML IJ SOLN
INTRAMUSCULAR | Status: DC | PRN
  Administered 2020-07-12: 10 mg via INTRAVENOUS

## 2020-07-12 MED ORDER — BELLADONNA ALKALOIDS-OPIUM 16.2-30 MG RE SUPP
RECTAL | Status: AC
  Filled 2020-07-12: qty 1

## 2020-07-12 MED ORDER — FENTANYL CITRATE (PF) 250 MCG/5ML IJ SOLN
INTRAMUSCULAR | Status: DC | PRN
  Administered 2020-07-12 (×6): 50 ug via INTRAVENOUS

## 2020-07-12 MED ORDER — FENTANYL CITRATE (PF) 100 MCG/2ML IJ SOLN
25.0000 ug | INTRAMUSCULAR | Status: DC | PRN
  Administered 2020-07-12: 25 ug via INTRAVENOUS

## 2020-07-12 SURGICAL SUPPLY — 27 items
BAG URO CATCHER STRL LF (MISCELLANEOUS) ×3 IMPLANT
BASKET ZERO TIP NITINOL 2.4FR (BASKET) IMPLANT
CATH URET 5FR 28IN OPEN ENDED (CATHETERS) ×3 IMPLANT
CLOTH BEACON ORANGE TIMEOUT ST (SAFETY) ×3 IMPLANT
COVER SURGICAL LIGHT HANDLE (MISCELLANEOUS) ×3 IMPLANT
COVER WAND RF STERILE (DRAPES) IMPLANT
EXTRACTOR STONE 1.7FRX115CM (UROLOGICAL SUPPLIES) ×3 IMPLANT
EXTRACTOR STONE NITINOL NGAGE (UROLOGICAL SUPPLIES) ×6 IMPLANT
GLOVE BIOGEL M STRL SZ7.5 (GLOVE) ×3 IMPLANT
GOWN STRL REUS W/TWL XL LVL3 (GOWN DISPOSABLE) ×3 IMPLANT
GUIDEWIRE ANG ZIPWIRE 038X150 (WIRE) IMPLANT
GUIDEWIRE STR DUAL SENSOR (WIRE) ×6 IMPLANT
KIT TURNOVER KIT A (KITS) IMPLANT
LASER FIB FLEXIVA PULSE ID 365 (Laser) IMPLANT
LASER FIB FLEXIVA PULSE ID 550 (Laser) IMPLANT
LASER FIB FLEXIVA PULSE ID 910 (Laser) IMPLANT
MANIFOLD NEPTUNE II (INSTRUMENTS) ×3 IMPLANT
PACK CYSTO (CUSTOM PROCEDURE TRAY) ×3 IMPLANT
PENCIL SMOKE EVACUATOR (MISCELLANEOUS) IMPLANT
SHEATH URETERAL 12FRX28CM (UROLOGICAL SUPPLIES) IMPLANT
SHEATH URETERAL 12FRX35CM (MISCELLANEOUS) ×3 IMPLANT
STENT URET 6FRX24 CONTOUR (STENTS) ×6 IMPLANT
TRACTIP FLEXIVA PULS ID 200XHI (Laser) ×1 IMPLANT
TRACTIP FLEXIVA PULSE ID 200 (Laser) ×3
TUBING CONNECTING 10 (TUBING) ×2 IMPLANT
TUBING CONNECTING 10' (TUBING) ×1
TUBING UROLOGY SET (TUBING) ×3 IMPLANT

## 2020-07-12 NOTE — H&P (Signed)
I have kidney stones.  HPI: Monique [name] is a 79 year-old female established patient who is here for renal calculi.  06/21/20: Patient presented to the emergency department on 11/17 with complaints of abdominal pain, which started the day prior. She states she was having some lower abdominal and bladder pressure following voiding. No complaints of dysuria or increased voiding symptoms otherwise. She denies any fever, chills, nausea, or vomiting. Urinalysis showed red blood cells, but was otherwise unremarkable. Creatinine noted to be 0.67. WBC 12.4. CT imaging revealed mild left hydronephrosis secondary to a 4 mm left UVJ calculus. There was some perinephric edema, as well. She also had bilateral nephrolithiasis. She returns today for follow-up evaluation. She denies passing a stone in the interim. She states that she continues to have some left lower quadrant pain. She uses oxycodone for chronic pain management. She was told to by ER MD to increase dosage, as this was not controlling her pain. However, she feels she has had issues tolerating increased dosage. She does state it is effective for pain management currently. She denies any current flank or abdominal pain. She denies dysuria or exacerbation of voiding symptomatology. She has had some intermittent nausea, but denies emesis. She denies fevers or chills. She is on Plavix. She does have extensive cardiac history. She denies issues with general anesthesia.      ALLERGIES: Amoxicillin Penicillin Sulfur     MEDICATIONS: Lisinopril 40 mg tablet  Plavix 75 mg tablet  Simvastatin 40 mg tablet  Zyrtec  Hydrocodone-Acetaminophen 10 mg-325 mg tablet  Low Dose Aspirin Ec 81 mg tablet, delayed release  Miralax  Nitrofurantoin 100 mg capsule  Oxycodone-Acetaminophen  Pepcid Ac  Venlafaxine Hcl 75 mg tablet     GU PSH: Complex cystometrogram, w/ void pressure and urethral pressure profile studies, any technique - 2019 Complex Uroflow -  2019 Emg surf Electrd - 2019 Hysterectomy, 2000 Intrabd voidng Press - 2019 Sling - 2019       PSH Notes: colon surgery (colon rupture)- 2017, shoulder surgery, wrist surgery   NON-GU PSH: Hip Replacement, 2012     GU PMH: Stress Incontinence - 2019 Mixed incontinence - 2019 Interstitial Cystitis (w/o hematuria) - 2018    NON-GU PMH: Anxiety Arthritis Asthma Depression GERD Hypercholesterolemia Hyperthyroidism Osteoarthritis    FAMILY HISTORY: 1 Daughter - Daughter 1 son - Son nephrolithiasis - Son   SOCIAL HISTORY: Marital Status: Widowed Preferred Language: English; Ethnicity: Not Hispanic Or Latino; Race: White Current Smoking Status: Patient has never smoked.   Tobacco Use Assessment Completed: Used Tobacco in last 30 days? Has never drank.  Does not drink caffeine. Patient's occupation is/was Retired.    REVIEW OF SYSTEMS:    GU Review Female:   Patient denies hard to postpone urination, burning /pain with urination, get up at night to urinate, leakage of urine, stream starts and stops, trouble starting your stream, have to strain to urinate, and being pregnant.  Gastrointestinal (Upper):   Patient reports nausea. Patient denies vomiting and indigestion/ heartburn.  Gastrointestinal (Lower):   Patient denies diarrhea and constipation.  Constitutional:   Patient denies fever, night sweats, weight loss, and fatigue.  Skin:   Patient denies skin rash/ lesion and itching.  Eyes:   Patient denies blurred vision and double vision.  Ears/ Nose/ Throat:   Patient denies sore throat and sinus problems.  Hematologic/Lymphatic:   Patient denies swollen glands and easy bruising.  Cardiovascular:   Patient denies leg swelling and chest pains.  Respiratory:  Patient denies cough and shortness of breath.  Endocrine:   Patient denies excessive thirst.  Musculoskeletal:   Patient denies back pain and joint pain.  Neurological:   Patient denies headaches and dizziness.   Psychologic:   Patient denies depression and anxiety.   VITAL SIGNS:      06/21/2020 09:25 AM 06/21/2020 08:40 AM  BP 90/57 mmHg 97/61 mmHg  Heart Rate 103 /min 114 /min  Temperature   97.7 F / 36.5 C  Notes: Manual BP 10:10 am 108/78      MULTI-SYSTEM PHYSICAL EXAMINATION:    Constitutional: Obese. No physical deformities. Normally developed. Good grooming.   Respiratory: Normal breath sounds. No labored breathing, no use of accessory muscles.   Cardiovascular: Regular rate and rhythm. No murmur, no gallop. Normal temperature, normal extremity pulses, no swelling, no varicosities. Tachycardia noted  Gastrointestinal: No mass, no tenderness, no rigidity, non obese abdomen. Mild LLQ tenderness   Musculoskeletal: Normal gait and station of head and neck.     Complexity of Data:  Lab Test Review:   BMP, CBC with Diff  Records Review:   Previous Patient Records  Urine Test Review:   Urinalysis  Urodynamics Review:   Review Bladder Scan  X-Ray Review: C.T. Abdomen/Pelvis: Reviewed Films. Reviewed Report.     PROCEDURES:         KUB - [id number]  A single view of the abdomen is obtained. Stable appearing opacity noted within the confines of the right renal shadow. I do not appreciate any obvious opacities noted along the expected anatomical course of the right ureter. Stable appearing opacities noted within the confines of the left renal shadow, in the vicinity of the left renal pelvis. Along the expected anatomical course of the left ureter, there continues to be an approximately 4 mm opacity noted in the vicinity of the left UVJ. Prominent overlying bowel gas pattern. Right hip hardware noted.      Patient confirmed No Neulasta OnPro Device.           Urinalysis w/Scope Dipstick Dipstick Cont'd Micro  Color: Yellow Bilirubin: Neg mg/dL WBC/hpf: 10 - 16/XWR  Appearance: Clear Ketones: Neg mg/dL RBC/hpf: 10 - 60/AVW  Specific Gravity: 1.020 Blood: 3+ ery/uL Bacteria: Few (10-25/hpf)   pH: <=5.0 Protein: Neg mg/dL Cystals: NS (Not Seen)  Glucose: Neg mg/dL Urobilinogen: 0.2 mg/dL Casts: NS (Not Seen)    Nitrites: Neg Trichomonas: Not Present    Leukocyte Esterase: 2+ leu/uL Mucous: Present      Epithelial Cells: 0 - 5/hpf      Yeast: NS (Not Seen)      Sperm: Not Present    Notes: microscopic not concentrated    ASSESSMENT:      ICD-10 Details  1 GU:   Renal calculus - N20.0   2   Renal and ureteral calculus - N20.2    PLAN:           Orders Labs Urine Culture  X-Rays: KUB  X-Ray Notes: History:  Hematuria: Yes/No  Patient to see MD after exam: Yes/No  Previous exam: CT / IVP/ US/ KUB/ None  When:  Where:  Diabetic: Yes/ No  BUN/ Creatinine:  Date of last BUN Creatinine:  Weight in pounds:  Allergy- IV Contrast: Yes/ No  Conflicting diabetic meds: Yes/ No  Diabetic Meds:  Prior Authorization #:            Schedule         Document Letter(s):  Created for Patient:  Clinical Summary         Notes:   On initial evaluation today in clinic, the patient was noted to be hypotensive and tachycardic. This did improve during the course of her clinic visit. Urinalysis today does show slight amount of pyuria and bacteriuria. Precautionary culture was sent. Given her presentation today in the setting of obstructing ureteral calculus, case was reviewed with the on-call urologist. Will plan to proceed to the operating room for cystoscopy and left ureteral stent placement. I reviewed the procedure and potential risk in detail with the patient. She will be monitored overnight. Will plan to set up outpatient ureteroscopy for definitive stone management in the upcoming weeks. She will remain NPO.

## 2020-07-12 NOTE — Discharge Instructions (Signed)
DISCHARGE INSTRUCTIONS FOR KIDNEY STONE/URETERAL STENT   MEDICATIONS:  1.  Resume all your other meds from home.  2. Pyridium is to help with the burning/stinging when you urinate. 3. Take the oxycodone that you already have for any additional pain. 4.  Resume plavix in 48 hours.  ACTIVITY:  1. No strenuous activity x 1week  2. No driving while on narcotic pain medications  3. Drink plenty of water  4. Continue to walk at home - you can still get blood clots when you are at home, so keep active, but don't over do it.  5. May return to work/school tomorrow or when you feel ready   BATHING:  1. You can shower and we recommend daily showers   SIGNS/SYMPTOMS TO CALL:  Please call us if you have a fever greater than 101.5, uncontrolled nausea/vomiting, uncontrolled pain, dizziness, unable to urinate, bloody urine, chest pain, shortness of breath, leg swelling, leg pain, redness around wound, drainage from wound, or any other concerns or questions.   You can reach [place] at [phone].   FOLLOW-UP:  1. You have an appointment for stent removal in 1 week.

## 2020-07-12 NOTE — Anesthesia Procedure Notes (Signed)
Procedure Name: LMA Insertion Date/Time: 07/12/2020 9:29 AM Performed by: Shary Decamp, CRNA Pre-anesthesia Checklist: Patient identified, Emergency Drugs available, Suction available, Patient being monitored and Timeout performed Patient Re-evaluated:Patient Re-evaluated prior to induction Oxygen Delivery Method: Circle system utilized Preoxygenation: Pre-oxygenation with 100% oxygen Induction Type: IV induction and Inhalational induction Ventilation: Mask ventilation without difficulty LMA: LMA inserted LMA Size: 4.0 Dental Injury: Teeth and Oropharynx as per pre-operative assessment

## 2020-07-12 NOTE — Interval H&P Note (Signed)
History and Physical Interval Note:  07/12/2020 9:12 AM  Monique [name]  has presented today for surgery, with the diagnosis of BILATERAL RENAL CALCULI, LEFT DISTAL CALCULUS.  The various methods of treatment have been discussed with the patient and family. After consideration of risks, benefits and other options for treatment, the patient has consented to  Procedure(s) with comments: CYSTOSCOPY WITH RETROGRADE PYELOGRAM, URETEROSCOPY AND STENT PLACEMENT (Bilateral) - 2 HRS HOLMIUM LASER APPLICATION (Bilateral) as a surgical intervention.  The patient's history has been reviewed, patient examined, no change in status, stable for surgery.  I have reviewed the patient's chart and labs.  Questions were answered to the patient's satisfaction.     Crist Fat

## 2020-07-12 NOTE — Op Note (Signed)
Preoperative diagnosis:  Bilateral ureteral stones    Postoperative diagnosis:  same   Procedure: Cystoscopy, retrograde pyelogram bilaterally with interpretation Bilateral ureteroscopy, laser lithotripsy and stone extraction Bilateral ureteral stent exchange.  Surgeon: Crist Fat, MD  Anesthesia: General  Complications: None  Intraoperative findings:  1:  2 left retrograde pyelogram demonstrated a filling defect in the distal/UVJ region of the ureter with no other filling defects or abnormalities within the ureter.  There is a large filling defect in the renal pelvis consistent with the patient's known stone. 2:  The patient's right retrograde pyelogram demonstrated some filling defects within the renal pelvis, but there is no hydroureteronephrosis.   3: On inspection of the right renal pelvis and collecting system there were lots of clots accounting for the filling defect.  There is a small stone in the lower pole which was fragmented and removed. 4:  All the stones were fragmented and removed.  The stones were very hard and not easily dusted.   EBL: Minimal  Specimens: None  Indication: Monique [name] is a 79 y.o. patient with bilateral ureteral stones, left flank pain and concern or a UTI who has subsequently had bilateral ureteral stents placed.  After reviewing the management options for treatment, he elected to proceed with the above surgical procedure(s). We have discussed the potential benefits and risks of the procedure, side effects of the proposed treatment, the likelihood of the patient achieving the goals of the procedure, and any potential problems that might occur during the procedure or recuperation. Informed consent has been obtained.  Description of procedure:  Consent was obtained in the preoperative holding area.  She was then brought back to the operating room placed on the table in supine position.  General anesthesia was then induced and  endotracheal tube inserted.  She was then placed in dorsal lithotomy position and prepped and draped in the routine sterile fashion.  A time-out was then performed.  A 21 French 30 degree cystoscope was gently passed through the patient's urethra and into the bladder under visual guidance.  Stent emanating from the patient's left kidney was grasped and brought to the urethral meatus.  A Sensor wire was then advanced up through the stent and the stent removed over the wire.  A retrograde catheter was then inserted over the wire and pulled back to the distal ureter and retrograde pyelogram performed with the above findings.  I then replaced the wire and removed the retrograde catheter.  Using a semi rigid ureteroscope I was able to easily encounter the stone in the left distal ureter and fragmented with a 200 micron laser fiber into for 5 pieces that I was able to remove with an engage basket.  I then inspected the remaining aspect of the ureter and did not find any additional stones.  I advanced a 2nd wire through the ureteral scope and removed the ureteral scope over the wire.  I then advanced a 12/14 French ureteral access sheath over the 2nd wire into the proximal ureter.  Using the flexible ureteroscope advanced up through the access sheath and into the renal pelvis.  The stone was in the left renal pelvis was easily encountered in the pelvis and fragmented into for bigger pieces that I then was able to manipulate up into the upper pole.  I then fragmented the stone into smaller pieces that I could remove with the basket.  There were several very small fragments that remained once I had completed extracting the stone.  These remaining fragments were too small to grasp and should pass quite easily.  This point has slowly backed out the ureteroscope and removed the access she simultaneously.  There was a small ureterotomy in the proximal ureter from the access she has, there was 2 small stones in this area that I  was unable to extract/removed.  Again, they were smaller than the basket could grasp.  Backing out the remainder of the way there were no additional areas of trauma or concern.  I then advanced a 6 the [place] x 24 cm double-J ureteral stent over the wire and into the left renal pelvis under fluoroscopic guidance.  Once it was noted to be well within the renal pelvis side best to the urethral meatus and then slowly backed up the wire.  The stent easily curled in the patient's bladder once it was fully deployed, as confirmed by fluoroscopy.  I then reinserted the cystoscope and grasped the stent image for the patient's right ureteral orifice and pulled that to the urethral meatus.  I did advance the wire through the stent and removed his stent over the wire.  I then exchanged the wire for an open-ended ureteral catheter and performed retrograde pyelogram with 10 cc of Omnipaque contrast with the above findings.  I then repassed the wire into the right renal pelvis.  I then passed a 2nd wire up into the right ureter and over the 2nd wire passed a 12/14 French ureteral access sheath.  The inner portion of the access sheath and the 2nd wire removed.  Using the flexible scope explored the right renal pelvis and noted quite a bit of clot in the midportion of the calices.  Ultimately was able to locate stone which is in the lower pole.  I grafts the stone and removed it to the upper pole and then used the same laser fiber as I used on the left side to fragment the stone into 4 pieces that I then was able to remove.  I was unable to evacuate the clot given the size of them.  Ultimately, they will lyse on their own.  I slowly backed out the ureteral scope and the access she simultaneously and there were no identifiable traumatic areas of the ureter.  And then advanced a 24 cm x 6 French double-J ureteral stent over the wire and into the right renal pelvis under fluoroscopic guidance.  Once the stent was noted to be well  within the renal pelvis advanced to the urethral meatus before removing the wire entirely.  Once the wire to been completely removed his stent curled in the patient's bladder.  This was confirmed by fluoroscopy.  The bladder subsequently emptied.  A B&O suppositories placed in the patient's rectum.  She was subsequently extubated return the packing good condition.  Disposition:  Both stent tethers were removed prior to deploying the stents.  As such the patient will be scheduled in 1 week for stent removal.    Crist Fat, M.D.

## 2020-07-12 NOTE — Anesthesia Postprocedure Evaluation (Signed)
Anesthesia Post Note  Patient: [name]  Procedure(s) Performed: CYSTOSCOPY WITH RETROGRADE PYELOGRAM, URETEROSCOPY AND STENT PLACEMENT (Bilateral ) HOLMIUM LASER APPLICATION (Bilateral )     Patient location during evaluation: PACU Anesthesia Type: General Level of consciousness: awake and alert Pain management: pain level controlled Vital Signs Assessment: post-procedure vital signs reviewed and stable Respiratory status: spontaneous breathing, nonlabored ventilation and respiratory function stable Cardiovascular status: blood pressure returned to baseline and stable Postop Assessment: no apparent nausea or vomiting Anesthetic complications: no   No complications documented.  Last Vitals:  Vitals:   07/12/20 1330 07/12/20 1400  BP: (!) 164/81 (!) 168/84  Pulse: 94   Resp: 20   Temp: 36.7 C   SpO2: 91% 92%    Last Pain:  Vitals:   07/12/20 1400  TempSrc:   PainSc: 4                  Ena Demary,W. EDMOND

## 2020-07-12 NOTE — Transfer of Care (Signed)
Immediate Anesthesia Transfer of Care Note  Patient: Monique [name]  Procedure(s) Performed: CYSTOSCOPY WITH RETROGRADE PYELOGRAM, URETEROSCOPY AND STENT PLACEMENT (Bilateral ) HOLMIUM LASER APPLICATION (Bilateral )  Patient Location: PACU  Anesthesia Type:General  Level of Consciousness: drowsy, patient cooperative and responds to stimulation  Airway & Oxygen Therapy: Patient Spontanous Breathing and Patient connected to face mask oxygen  Post-op Assessment: Report given to RN and Post -op Vital signs reviewed and stable  Post vital signs: Reviewed and stable  Last Vitals:  Vitals Value Taken Time  BP 116/56 07/12/20 1220  Temp    Pulse 91 07/12/20 1221  Resp 14 07/12/20 1221  SpO2 98 % 07/12/20 1221  Vitals shown include unvalidated device data.  Last Pain:  Vitals:   07/12/20 0833  TempSrc:   PainSc: 3          Complications: No complications documented.

## 2020-07-15 ENCOUNTER — Encounter (HOSPITAL_COMMUNITY): Payer: Self-pay | Admitting: Urology

## 2020-07-24 ENCOUNTER — Other Ambulatory Visit: Payer: Self-pay

## 2020-07-24 ENCOUNTER — Encounter: Payer: Self-pay | Admitting: Physician Assistant

## 2020-07-24 ENCOUNTER — Ambulatory Visit (INDEPENDENT_AMBULATORY_CARE_PROVIDER_SITE_OTHER): Payer: Medicare Other | Admitting: Physician Assistant

## 2020-07-24 VITALS — BP 136/70 | HR 99 | Ht 65.0 in | Wt 187.2 lb

## 2020-07-24 DIAGNOSIS — I25810 Atherosclerosis of coronary artery bypass graft(s) without angina pectoris: Secondary | ICD-10-CM

## 2020-07-24 DIAGNOSIS — I2510 Atherosclerotic heart disease of native coronary artery without angina pectoris: Secondary | ICD-10-CM

## 2020-07-24 DIAGNOSIS — E785 Hyperlipidemia, unspecified: Secondary | ICD-10-CM | POA: Diagnosis not present

## 2020-07-24 DIAGNOSIS — I10 Essential (primary) hypertension: Secondary | ICD-10-CM | POA: Diagnosis not present

## 2020-07-24 NOTE — Patient Instructions (Signed)
Medication Instructions:  Your physician recommends that you continue on your current medications as directed. Please refer to the Current Medication list given to you today.  *If you need a refill on your cardiac medications before your next appointment, please call your pharmacy*   Lab Work: Your physician recommends that you return for lab work 2-3 days prior to March appointment with Dr. Allyson Sabal in:  Fasting Lipid Panel-DO NOT EAT OR DRINK PAST MIDNIGHT. OKAY TO HAVE WATER.  Hepatic (Liver) Function Test   If you have labs (blood work) drawn today and your tests are completely normal, you will receive your results only by: Marland Kitchen MyChart Message (if you have MyChart) OR . A paper copy in the mail If you have any lab test that is abnormal or we need to change your treatment, we will call you to review the results.  Testing/Procedures: NONE ordered at this time of appointment   Follow-Up: At [facility], you and your health needs are our priority.  As part of our continuing mission to provide you with exceptional heart care, we have created designated Provider Care Teams.  These Care Teams include your primary Cardiologist (physician) and Advanced Practice Providers (APPs -  Physician Assistants and Nurse Practitioners) who all work together to provide you with the care you need, when you need it.  Your next appointment:   Follow up as scheduled    The format for your next appointment:   In Person  Provider:   [name], MD  Other Instructions

## 2020-07-24 NOTE — Progress Notes (Signed)
Cardiology Office Note:    Date:  07/26/2020   ID:  Monique [name], DOB Mar 22, 1941, MRN [id number]  PCP:  [name], FNP  [facility] Cardiologist:  [name], MD  [facility] Electrophysiologist:  None   Referring MD: [name], FNP   Chief Complaint  Patient presents with   Follow-up    Seen for Dr. Allyson Sabal    History of Present Illness:    Monique [name] is a 79 y.o. female with a hx of HTN, HLD, CAD s/p CABG and history of asymptomatic sinus tachycardia.  She has strong family history of CAD with her father died of MI at age 95.  She underwent diagnostic cardiac catheterization on 05/30/2018 which revealed high-grade calcified proximal LAD lesion treated with 2.5 x 20 mm Synergy DES, 80% small ramus lesion and 40% ostial OM1 were treated medically.  She was also anemic with hemoglobin of 8.1 at the time.  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  She eventually underwent CABG x3 by Dr. Laneta Simmers on 11/21/2018 with limited-LAD, SVG-OM1, and SVG-ramus.  Postop course was uncomplicated.  I last saw the patient in September 2021 at which time she was depressed however she she says she was released by her psychiatrist.  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  During the examination, I noted a right radial bruit, this is likely a small fistula as result of previous cardiac catheterization.  More recently, she was seen by Edd Fabian on 06/05/2020 at which time she was doing well.  Unfortunately she was diagnosed with kidney stones and had cystoscopy and ureteroscopy with stone extraction recently.  She contact cardiology service on 06/20/2020 due to chest discomfort that resolved with nitroglycerin.  We recommended continue observation time.  Patient presents for follow-up.  She only ever had that one episode of  chest discomfort.  Symptom resolved over a month ago and has not recurred since.  She finally underwent kidney stone extraction and felt much better.  She has not been exercising for the past month due to the kidney stones.  Otherwise she is doing well without any chest pain or shortness of breath.  She occasionally has some right lower extremity spasm, however symptom is inconsistent with PAD.  She is due for fasting lipid panel and LFT in March prior to her visit with Dr. Allyson Sabal.  Overall, she is stable from cardiac perspective.   Past Medical History:  Diagnosis Date   Anemia     mild as per PCP   Anginal pain (HCC)    06/20/20, relieved    Arthritis    hip/ s/p recent shoulder fracture 8/12- RIGHT   Asthma    Coronary artery disease 05/30/2018   LAD PCI/DES   GI bleeding    Hiatal hernia    Humerus fracture    with wrist on right side   Hypertension    hypercholesterolemia/  EKG on chart with clearance and note 06/18/11  Mazz[place]chi   Pneumonia     Past Surgical History:  Procedure Laterality Date   ABDOMINAL HYSTERECTOMY     COLECTOMY WITH COLOSTOMY CREATION/HARTMANN PROCEDURE N/A 05/07/2016   Procedure: COLOSTOMY CREATION/HARTMANN PROCEDURE;  Surgeon: Romie Levee, MD;  Location: [facility];  Service: General;  Laterality: N/A;   COLONOSCOPY N/A 08/26/2016   Procedure: COLONOSCOPY;  Surgeon: Romie Levee, MD;  Location: [facility];  Service: Endoscopy;  Laterality:  N/A;   COLOSTOMY TAKEDOWN N/A 08/27/2016   Procedure: LAPAROSCOPIC COLOSTOMY REVERSAL;  Surgeon: Romie LeveeAlicia Thomas, MD;  Location: [facility];  Service: General;  Laterality: N/A;   CORONARY ARTERY BYPASS GRAFT N/A 11/21/2018   Procedure: CORONARY ARTERY BYPASS GRAFTING (CABG) x Three , using left internal mammary artery and right leg greater saphenous vein harvested endoscopically;  Surgeon: Alleen BorneBartle, Bryan K, MD;  Location: [facility];  Service: Open Heart Surgery;  Laterality: N/A;   CORONARY STENT INTERVENTION N/A  05/30/2018   Procedure: CORONARY STENT INTERVENTION;  Surgeon: Runell GessBerry, Jonathan J, MD;  Location: [facility];  Service: Cardiovascular;  Laterality: N/A;   CYSTOSCOPY WITH RETROGRADE PYELOGRAM, URETEROSCOPY AND STENT PLACEMENT Bilateral 06/21/2020   Procedure: CYSTOSCOPY WITH RETROGRADE PYELOGRAM, AND BILATERAL STENT PLACEMENT;  Surgeon: Sebastian AcheManny, Theodore, MD;  Location: [facility];  Service: Urology;  Laterality: Bilateral;   CYSTOSCOPY WITH RETROGRADE PYELOGRAM, URETEROSCOPY AND STENT PLACEMENT Bilateral 07/12/2020   Procedure: CYSTOSCOPY WITH RETROGRADE PYELOGRAM, URETEROSCOPY AND STENT PLACEMENT;  Surgeon: Crist FatHerrick, Benjamin W, MD;  Location: [facility];  Service: Urology;  Laterality: Bilateral;  2 HRS   EYE SURGERY     eyelid tuck bilateral   HOLMIUM LASER APPLICATION Bilateral 07/12/2020   Procedure: HOLMIUM LASER APPLICATION;  Surgeon: Crist FatHerrick, Benjamin W, MD;  Location: [facility];  Service: Urology;  Laterality: Bilateral;   LAPAROTOMY N/A 05/07/2016   Procedure: EXPLORATORY LAPAROTOMY, LYLSIS OF ADHSIONS, Pam DrownEVACATION OF PERITONEAL ABSCESS;  Surgeon: Romie LeveeAlicia Thomas, MD;  Location: [facility];  Service: General;  Laterality: N/A;   LEFT HEART CATH AND CORONARY ANGIOGRAPHY N/A 05/30/2018   Procedure: LEFT HEART CATH AND CORONARY ANGIOGRAPHY;  Surgeon: Runell GessBerry, Jonathan J, MD;  [facility]: [facility];  Service: Cardiovascular;  Laterality: N/A;   LEFT HEART CATH AND CORONARY ANGIOGRAPHY N/A 11/17/2018   Procedure: LEFT HEART CATH AND CORONARY ANGIOGRAPHY;  Surgeon: Runell GessBerry, Jonathan J, MD;  Location: [facility];  Service: Cardiovascular;  Laterality: N/A;   PARATHYROIDECTOMY     one lobe- benign per pt   TEE WITHOUT CARDIOVERSION N/A 11/21/2018   Procedure: TRANSESOPHAGEAL ECHOCARDIOGRAM (TEE);  Surgeon: Alleen BorneBartle, Bryan K, MD;  [facility];  Service: Open Heart Surgery;  Laterality: N/A;   TONSILLECTOMY     TOTAL HIP ARTHROPLASTY  07/08/2011   Procedure: TOTAL HIP ARTHROPLASTY;   Surgeon: Gus RankinFrank V Aluisio;  Location: [facility];  Service: Orthopedics;  Laterality: Right;    Current Medications: Current Meds  Medication Sig   aspirin EC 81 MG tablet Take 81 mg by mouth daily.   atorvastatin (LIPITOR) 80 MG tablet TAKE 1 TABLET BY MOUTH DAILY AT 6 PM (Patient taking differently: Take 80 mg by mouth daily.)   carvedilol (COREG) 6.25 MG tablet TAKE 1 TABLET(6.25 MG) BY MOUTH TWICE DAILY WITH A MEAL (Patient taking differently: Take 6.25 mg by mouth 2 (two) times daily with a meal.)   cetirizine (ZYRTEC) 10 MG tablet Take 10 mg by mouth at bedtime.   Cholecalciferol (VITAMIN D PO) Take 2,000 Units by mouth daily.    clopidogrel (PLAVIX) 75 MG tablet Take 1 tablet (75 mg total) by mouth daily. On hold (Patient taking differently: Take 75 mg by mouth daily. No longer on hold)   diclofenac Sodium (VOLTAREN) 1 % GEL Apply topically as needed.   DULoxetine (CYMBALTA) 30 MG capsule Take 90 mg by mouth daily.    famotidine (PEPCID) 20 MG tablet Take 20 mg by mouth daily as needed for heartburn or indigestion.   fluticasone (  FLONASE) 50 MCG/ACT nasal spray Place 2 sprays into both nostrils daily as needed for allergies.    lisinopril (PRINIVIL,ZESTRIL) 40 MG tablet Take 40 mg by mouth daily.   MAGNESIUM OXIDE PO Take 250 mg by mouth daily.    nitroGLYCERIN (NITROSTAT) 0.4 MG SL tablet Place 0.4 mg under the tongue as needed.   oxyCODONE-acetaminophen (PERCOCET/ROXICET) 5-325 MG tablet 1 tablet every 6 (six) hours as needed for severe pain.    pantoprazole (PROTONIX) 40 MG tablet Take 1 tablet (40 mg total) by mouth 2 (two) times daily before a meal.   phenazopyridine (PYRIDIUM) 200 MG tablet Take 1 tablet (200 mg total) by mouth 3 (three) times daily as needed for pain.   polyethylene glycol (MIRALAX / GLYCOLAX) packet Take 17 g by mouth 2 (two) times daily. (Patient taking differently: Take 17 g by mouth at bedtime as needed for moderate constipation.)   Prenatal  MV-Min-Fe Fum-FA-DHA (PRENATAL MULTIVITAMIN + DHA) 28-0.8 & 200 MG MISC Take 1 tablet by mouth daily.   RESTASIS 0.05 % ophthalmic emulsion Place 1 drop into both eyes 2 (two) times daily.    VENTOLIN HFA 108 (90 Base) MCG/ACT inhaler Inhale 2 puffs into the lungs every 4 (four) hours as needed for wheezing or shortness of breath.    vitamin B-12 (CYANOCOBALAMIN) 1000 MCG tablet Take 1,000 mcg by mouth daily.     Allergies:   Morphine, Penicillins, and Pregabalin   Social History   Socioeconomic History   Marital status: Widowed    Spouse name: Not on file   Number of children: Not on file   Years of education: Not on file   Highest education level: Not on file  Occupational History   Not on file  Tobacco Use   Smoking status: Never Smoker   Smokeless tobacco: Never Used  Vaping Use   Vaping Use: Never used  Substance and Sexual Activity   Alcohol use: Yes    Comment: socially- 2 x year   Drug use: No   Sexual activity: Not on file    Comment: Hysterectomy  Other Topics Concern   Not on file  Social History Narrative   Not on file   Social Determinants of Health   Financial Resource Strain: Not on file  Food Insecurity: Not on file  Transportation Needs: Not on file  Physical Activity: Not on file  Stress: Not on file  Social Connections: Not on file     Family History: The patient's family history includes CAD (age of onset: 89) in her father.  ROS:   Please see the history of present illness.     All other systems reviewed and are negative.  EKGs/Labs/Other Studies Reviewed:    The following studies were reviewed today:  Echo 11/17/2018 1. The left ventricle has hyperdynamic systolic function, with an  ejection fraction of >65%. The cavity size was normal. There is mild  concentric left ventricular hypertrophy. Left ventricular diastolic  Doppler parameters are consistent with impaired  relaxation. No evidence of left ventricular regional  wall motion  abnormalities.  2. The right ventricle has normal systolic function. The cavity was  normal. There is no increase in right ventricular wall thickness.  3. The aortic valve is tricuspid. Mild sclerosis of the aortic valve.  Aortic valve regurgitation was not assessed by color flow Doppler.  4. The interatrial septum appears to be lipomatous.   EKG:  EKG is not ordered today.   Recent Labs: 08/03/2019: TSH 2.830 06/19/2020:  ALT 33 07/10/2020: BUN 24; Creatinine, Ser 0.64; Hemoglobin 12.7; Platelets 216; Potassium 4.4; Sodium 138  Recent Lipid Panel    Component Value Date/Time   CHOL 150 10/20/2019 1213   TRIG 227 (H) 10/20/2019 1213   HDL 55 10/20/2019 1213   CHOLHDL 2.7 10/20/2019 1213   CHOLHDL 2.8 06/01/2018 0345   VLDL 30 06/01/2018 0345   LDLCALC 59 10/20/2019 1213     Risk Assessment/Calculations:       Physical Exam:    VS:  BP 136/70 (BP Location: Left Arm, Patient Position: Sitting)    Pulse 99    Ht 5\' 5"  (1.651 m)    Wt 187 lb 3.2 oz (84.9 kg)    SpO2 94%    BMI 31.15 kg/m     Wt Readings from Last 3 Encounters:  07/24/20 187 lb 3.2 oz (84.9 kg)  07/12/20 186 lb (84.4 kg)  07/10/20 186 lb (84.4 kg)     GEN:  Well nourished, well developed in no acute distress HEENT: Normal NECK: No JVD; No carotid bruits LYMPHATICS: No lymphadenopathy CARDIAC: RRR, no murmurs, rubs, gallops RESPIRATORY:  Clear to auscultation without rales, wheezing or rhonchi  ABDOMEN: Soft, non-tender, non-distended MUSCULOSKELETAL:  No edema; No deformity  SKIN: Warm and dry NEUROLOGIC:  Alert and oriented x 3 PSYCHIATRIC:  Normal affect   ASSESSMENT:    1. Coronary artery disease involving coronary bypass graft of native heart without angina pectoris   2. Hyperlipidemia, unspecified hyperlipidemia type   3. Essential hypertension    PLAN:    In order of problems listed above:  1. CAD s/p CABG: On aspirin and Lipitor.  Patient had a single episode of chest  pain over a month ago, symptom has not recurred since despite activity.  We will continue observation  2. Hyperlipidemia: On Lipitor.  Due for fasting lipid panel and LFT  3. Hypertension: Blood pressure well controlled on current therapy.    Medication Adjustments/Labs and Tests Ordered: Current medicines are reviewed at length with the patient today.  Concerns regarding medicines are outlined above.  Orders Placed This Encounter  Procedures   Hepatic function panel   Lipid panel   No orders of the defined types were placed in this encounter.   Patient Instructions  Medication Instructions:  Your physician recommends that you continue on your current medications as directed. Please refer to the Current Medication list given to you today.  *If you need a refill on your cardiac medications before your next appointment, please call your pharmacy*   Lab Work: Your physician recommends that you return for lab work 2-3 days prior to March appointment with Dr. April in:  Fasting Lipid Panel-DO NOT EAT OR DRINK PAST MIDNIGHT. OKAY TO HAVE WATER.  Hepatic (Liver) Function Test   If you have labs (blood work) drawn today and your tests are completely normal, you will receive your results only by:  MyChart Message (if you have MyChart) OR  A paper copy in the mail If you have any lab test that is abnormal or we need to change your treatment, we will call you to review the results.  Testing/Procedures: NONE ordered at this time of appointment   Follow-Up: At [facility], you and your health needs are our priority.  As part of our continuing mission to provide you with exceptional heart care, we have created designated Provider Care Teams.  These Care Teams include your primary Cardiologist (physician) and Advanced Practice Providers (APPs -  Physician  Assistants and Nurse Practitioners) who all work together to provide you with the care you need, when you need it.  Your next  appointment:   Follow up as scheduled    The format for your next appointment:   In Person  Provider:   [name], MD  Other Instructions      Signed, Azalee Course, PA  07/26/2020 11:40 PM    [facility]

## 2020-07-25 ENCOUNTER — Encounter: Payer: Self-pay | Admitting: Cardiovascular Disease

## 2020-07-25 NOTE — Telephone Encounter (Signed)
error 

## 2020-07-26 ENCOUNTER — Encounter: Payer: Self-pay | Admitting: Physician Assistant

## 2020-08-25 ENCOUNTER — Other Ambulatory Visit: Payer: Self-pay | Admitting: Cardiovascular Disease

## 2020-08-27 ENCOUNTER — Telehealth: Payer: Self-pay | Admitting: Physician Assistant

## 2020-08-27 NOTE — Telephone Encounter (Signed)
New Message:      Pt said her pain doctor wants to make sure he can increase her Oxycodone 5mg /Acetaminoehen 325 mg from 120 to 150 a month. 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

## 2020-08-29 NOTE — Telephone Encounter (Signed)
I reviewed her case, no problem from cardiac perspective

## 2020-08-30 NOTE — Telephone Encounter (Signed)
Pt called and requested for [place] to call her pain doctor to relay this info to them   Dr Metta Clines office - [phone] They are only open Monday tues and wed.

## 2020-08-30 NOTE — Telephone Encounter (Signed)
Left message to call back  

## 2020-08-30 NOTE — Telephone Encounter (Signed)
Will call the office of Dr. Metta Clines on Tuesday when they are open.

## 2020-09-23 NOTE — Telephone Encounter (Signed)
Patient following up, states that Dr. Letta Moynahan office has not heard from our office. Please advise pt when his office is called.

## 2020-09-23 NOTE — Telephone Encounter (Signed)
Called the office of Dr. Metta Clines. Was informed that the information would need to be typed on letter head. Was given fax number of [phone]. Will have Azalee Course, PA-C to type and fax over letter.

## 2020-10-08 ENCOUNTER — Encounter: Payer: Self-pay | Admitting: Cardiovascular Disease

## 2020-10-08 ENCOUNTER — Ambulatory Visit (INDEPENDENT_AMBULATORY_CARE_PROVIDER_SITE_OTHER): Payer: Medicare Other | Admitting: Cardiovascular Disease

## 2020-10-08 ENCOUNTER — Other Ambulatory Visit: Payer: Self-pay

## 2020-10-08 VITALS — BP 130/72 | HR 99 | Ht 65.0 in | Wt 186.8 lb

## 2020-10-08 DIAGNOSIS — I2510 Atherosclerotic heart disease of native coronary artery without angina pectoris: Secondary | ICD-10-CM | POA: Diagnosis not present

## 2020-10-08 DIAGNOSIS — E785 Hyperlipidemia, unspecified: Secondary | ICD-10-CM | POA: Diagnosis not present

## 2020-10-08 DIAGNOSIS — I10 Essential (primary) hypertension: Secondary | ICD-10-CM | POA: Diagnosis not present

## 2020-10-08 DIAGNOSIS — Z9861 Coronary angioplasty status: Secondary | ICD-10-CM

## 2020-10-08 MED ORDER — CARVEDILOL 12.5 MG PO TABS: 12.5000 mg | ORAL_TABLET | Freq: Two times a day (BID) | ORAL | 3 refills | Status: DC

## 2020-10-08 NOTE — Assessment & Plan Note (Signed)
History of CAD status post radial cath performed by myself 05/27/2018 revealing high-grade calcified proximal LAD stenosis.  I stented her with a 2.5 x 20 mm long Synergy drug-eluting stent however there was a small area that did not completely expand leaving a 50% fairly focal stenosis within the stented segment.  She also had 80% ramus branch stenosis which was untreated.  Her angina did resolve.  Because of progressive angina I recath her 11/17/2018 revealing aggressive LAD in-stent restenosis and ultimately she underwent CABG x3 by Dr. Laneta Simmers 11/21/2018 with a LIMA to her LAD, vein to obtuse marginal branch and to the ramus branch.  Her postop course was uncomplicated.  She is remained asymptomatic.

## 2020-10-08 NOTE — Progress Notes (Signed)
10/08/2020 Monique [name]   [birth year]  [id number]  Primary Physician [name], FNP Primary Cardiologist: Runell Gess MD Nicholes Calamity, [place]  HPI:  Monique [name] is a 80 y.o.  moderately overweight widowed Caucasian female mother of 2, grandmother and 2 grandchildren's husband Monique [name] was a long-term patient of mine who died 08-10-16. 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 Shewasreferred by [name] nurse practitioner for a symptomatic sinus tachycardia.I last saw her in the office3/10/2019.Marland Kitchen Hercardiac risk factors include treated hypertension and hyperlipidemia patient is a family history of heart disease with father who died of a myocardial function and age 55. She has never had a heart attack or stroke. She denies chest pain or shortness of breath. She does exercise and does yoga as well. She has reactive airways disease. She is noted to be tachycardic by her PCP was referred here for further evaluation.  When I saw her in the office10/25/2019 she was complaining of increasing dyspnea and exertional chest pain. I performed radial diagnostic cath 05/22/2018 revealing high-grade calcified proximal LAD stenosis. I stented her with a 2.5 mm x 20 mm long Synergy drug-eluting stent however there was a small area that would not completely expand which left her with a fairly focal 50% stenosis within the dilated segment. She also had an 80% small ramus branch stenosis which was untreated. Her angina has resolved her dyspnea has improved. She was significantly anemic with a hemoglobin of 8.1 during her hospitalization which is improved with iron repletion up to the low 9 range.  She had done well until earlier this month when she started developing crescendo angina. She was seen by Dr.Acharyaon 11/04/2018 who adjusted her medications and increased her long-acting oral nitrate. She continues to have effort angina which is fairly reproducible as  well as some unstable symptoms at night.  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 She underwent CABG x3 by Dr. Bartle4/20/2020 with a LIMA to the LAD, vein to an obtuse marginal branch and ramus branch. Her postop course was uncomplicated. She  recuperated nicely and  and participated in the cardiac rehab program.  Since I saw her in the office a year ago she did well until December when she had an episode of nephrolithiasis requiring ureteral stent placement.  Ultimately this resolved.  She currently denies chest pain or shortness of breath.   Current Meds  Medication Sig   aspirin EC 81 MG tablet Take 81 mg by mouth daily.   atorvastatin (LIPITOR) 80 MG tablet TAKE 1 TABLET BY MOUTH DAILY AT 6 PM (Patient taking differently: Take 80 mg by mouth daily.)   cetirizine (ZYRTEC) 10 MG tablet Take 10 mg by mouth at bedtime.   Cholecalciferol (VITAMIN D PO) Take 2,000 Units by mouth daily.    clopidogrel (PLAVIX) 75 MG tablet TAKE 1 TABLET BY MOUTH EVERY DAY   diclofenac Sodium (VOLTAREN) 1 % GEL Apply topically as needed.   DULoxetine (CYMBALTA) 30 MG capsule Take 90 mg by mouth daily.    famotidine (PEPCID) 20 MG tablet Take 20 mg by mouth daily as needed for heartburn or indigestion.   fluticasone (FLONASE) 50 MCG/ACT nasal spray Place 2 sprays into both nostrils daily as needed for allergies.    lisinopril (PRINIVIL,ZESTRIL) 40 MG tablet Take 40 mg by mouth daily.   MAGNESIUM OXIDE PO Take 250 mg by mouth daily.  nitroGLYCERIN (NITROSTAT) 0.4 MG SL tablet Place 0.4 mg under the tongue as needed.   oxyCODONE-acetaminophen (PERCOCET/ROXICET) 5-325 MG tablet 1 tablet every 6 (six) hours as needed for severe pain.    pantoprazole (PROTONIX) 40 MG tablet Take 1 tablet (40 mg total) by mouth 2 (two) times daily before a meal.   polyethylene glycol (MIRALAX / GLYCOLAX) packet Take 17 g by  mouth 2 (two) times daily. (Patient taking differently: Take 17 g by mouth at bedtime as needed for moderate constipation.)   Prenatal MV-Min-Fe Fum-FA-DHA (PRENATAL MULTIVITAMIN + DHA) 28-0.8 & 200 MG MISC Take 1 tablet by mouth daily.   RESTASIS 0.05 % ophthalmic emulsion Place 1 drop into both eyes 2 (two) times daily.    VENTOLIN HFA 108 (90 Base) MCG/ACT inhaler Inhale 2 puffs into the lungs every 4 (four) hours as needed for wheezing or shortness of breath.    vitamin B-12 (CYANOCOBALAMIN) 1000 MCG tablet Take 1,000 mcg by mouth daily.   [DISCONTINUED] carvedilol (COREG) 6.25 MG tablet TAKE 1 TABLET(6.25 MG) BY MOUTH TWICE DAILY WITH A MEAL (Patient taking differently: Take 6.25 mg by mouth 2 (two) times daily with a meal.)     Allergies  Allergen Reactions   Morphine Other (See Comments)   Penicillins Other (See Comments)    UNSPECIFIED REACTION  Unknown reaction as a child.  Tolerated Zosyn 2017.   Pregabalin Anxiety    Social History   Socioeconomic History   Marital status: Widowed    Spouse name: Not on file   Number of children: Not on file   Years of education: Not on file   Highest education level: Not on file  Occupational History   Not on file  Tobacco Use   Smoking status: Never Smoker   Smokeless tobacco: Never Used  Vaping Use   Vaping Use: Never used  Substance and Sexual Activity   Alcohol use: Yes    Comment: socially- 2 x year   Drug use: No   Sexual activity: Not on file    Comment: Hysterectomy  Other Topics Concern   Not on file  Social History Narrative   Not on file   Social Determinants of Health   Financial Resource Strain: Not on file  Food Insecurity: Not on file  Transportation Needs: Not on file  Physical Activity: Not on file  Stress: Not on file  Social Connections: Not on file  Intimate Partner Violence: Not on file     Review of Systems: General: negative for chills, fever, night sweats or weight  changes.  Cardiovascular: negative for chest pain, dyspnea on exertion, edema, orthopnea, palpitations, paroxysmal nocturnal dyspnea or shortness of breath Dermatological: negative for rash Respiratory: negative for cough or wheezing Urologic: negative for hematuria Abdominal: negative for nausea, vomiting, diarrhea, bright red blood per rectum, melena, or hematemesis Neurologic: negative for visual changes, syncope, or dizziness All other systems reviewed and are otherwise negative except as noted above.    Blood pressure (!) 160/84, pulse 99, height 5\' 5"  (1.651 m), weight 186 lb 12.8 oz (84.7 kg), SpO2 95 %.  General appearance: alert and no distress Neck: no adenopathy, no carotid bruit, no JVD, supple, symmetrical, trachea midline and thyroid not enlarged, symmetric, no tenderness/mass/nodules Lungs: clear to auscultation bilaterally Heart: regular rate and rhythm, S1, S2 normal, no murmur, click, rub or gallop Extremities: extremities normal, atraumatic, no cyanosis or edema Pulses: 2+ and symmetric Skin: Skin color, texture, turgor normal. No rashes or lesions Neurologic: Alert  and oriented X 3, normal strength and tone. Normal symmetric reflexes. Normal coordination and gait  EKG sinus rhythm at 99 with nonspecific ST and T wave changes.  I personally reviewed this EKG. ASSESSMENT AND PLAN:   Essential hypertension History of essential hypertension with a blood pressure measured today 160/84.  She is on carvedilol and lisinopril.  Dyslipidemia, goal LDL below 70 History of dyslipidemia on statin therapy with lipid profile performed 10/20/2019 revealing total cholesterol 150, LDL 59 and HDL of 55.  CAD S/P percutaneous coronary angioplasty History of CAD status post radial cath performed by myself 05/27/2018 revealing high-grade calcified proximal LAD stenosis.  I stented her with a 2.5 x 20 mm long Synergy drug-eluting stent however there was a small area that did not completely  expand leaving a 50% fairly focal stenosis within the stented segment.  She also had 80% ramus branch stenosis which was untreated.  Her angina did resolve.  Because of progressive angina I recath her 11/17/2018 revealing aggressive LAD in-stent restenosis and ultimately she underwent CABG x3 by Dr. Laneta Simmers 11/21/2018 with a LIMA to her LAD, vein to obtuse marginal branch and to the ramus branch.  Her postop course was uncomplicated.  She is remained asymptomatic.      Runell Gess MD FACP,FACC,FAHA, [facility] 10/08/2020 3:33 PM

## 2020-10-08 NOTE — Assessment & Plan Note (Signed)
History of essential hypertension with a blood pressure measured today 160/84.  She is on carvedilol and lisinopril.

## 2020-10-08 NOTE — Patient Instructions (Signed)
Medication Instructions:   -Increase coreg (carvedilol) to 12.5mg  twice daily.  *If you need a refill on your cardiac medications before your next appointment, please call your pharmacy*   Follow-Up: At [facility], you and your health needs are our priority.  As part of our continuing mission to provide you with exceptional heart care, we have created designated Provider Care Teams.  These Care Teams include your primary Cardiologist (physician) and Advanced Practice Providers (APPs -  Physician Assistants and Nurse Practitioners) who all work together to provide you with the care you need, when you need it.  We recommend signing up for the patient portal called "MyChart".  Sign up information is provided on this After Visit Summary.  MyChart is used to connect with patients for Virtual Visits (Telemedicine).  Patients are able to view lab/test results, encounter notes, upcoming appointments, etc.  Non-urgent messages can be sent to your provider as well.   To learn more about what you can do with MyChart, go to [email or web address].    Your next appointment:   12 month(s)  The format for your next appointment:   In Person  Provider:   [name], MD

## 2020-10-08 NOTE — Assessment & Plan Note (Signed)
History of dyslipidemia on statin therapy with lipid profile performed 10/20/2019 revealing total cholesterol 150, LDL 59 and HDL of 55.

## 2021-01-31 ENCOUNTER — Emergency Department (HOSPITAL_BASED_OUTPATIENT_CLINIC_OR_DEPARTMENT_OTHER): Payer: Medicare Other

## 2021-01-31 ENCOUNTER — Encounter (HOSPITAL_BASED_OUTPATIENT_CLINIC_OR_DEPARTMENT_OTHER): Payer: Self-pay | Admitting: *Deleted

## 2021-01-31 ENCOUNTER — Emergency Department (HOSPITAL_BASED_OUTPATIENT_CLINIC_OR_DEPARTMENT_OTHER)
Admission: EM | Admit: 2021-01-31 | Discharge: 2021-01-31 | Disposition: A | Discharge status: Home / Self Care | Payer: Medicare Other | Attending: Emergency Medicine | Admitting: Emergency Medicine

## 2021-01-31 ENCOUNTER — Other Ambulatory Visit: Payer: Self-pay

## 2021-01-31 DIAGNOSIS — Z951 Presence of aortocoronary bypass graft: Secondary | ICD-10-CM | POA: Diagnosis not present

## 2021-01-31 DIAGNOSIS — Z79899 Other long term (current) drug therapy: Secondary | ICD-10-CM | POA: Insufficient documentation

## 2021-01-31 DIAGNOSIS — J45909 Unspecified asthma, uncomplicated: Secondary | ICD-10-CM | POA: Insufficient documentation

## 2021-01-31 DIAGNOSIS — R1031 Right lower quadrant pain: Secondary | ICD-10-CM | POA: Diagnosis present

## 2021-01-31 DIAGNOSIS — Z96641 Presence of right artificial hip joint: Secondary | ICD-10-CM | POA: Insufficient documentation

## 2021-01-31 DIAGNOSIS — N200 Calculus of kidney: Secondary | ICD-10-CM

## 2021-01-31 DIAGNOSIS — N132 Hydronephrosis with renal and ureteral calculous obstruction: Secondary | ICD-10-CM | POA: Insufficient documentation

## 2021-01-31 DIAGNOSIS — I2510 Atherosclerotic heart disease of native coronary artery without angina pectoris: Secondary | ICD-10-CM | POA: Insufficient documentation

## 2021-01-31 DIAGNOSIS — Z7902 Long term (current) use of antithrombotics/antiplatelets: Secondary | ICD-10-CM | POA: Diagnosis not present

## 2021-01-31 DIAGNOSIS — Z7982 Long term (current) use of aspirin: Secondary | ICD-10-CM | POA: Insufficient documentation

## 2021-01-31 DIAGNOSIS — I10 Essential (primary) hypertension: Secondary | ICD-10-CM | POA: Diagnosis not present

## 2021-01-31 LAB — URINALYSIS, ROUTINE W REFLEX MICROSCOPIC
Bilirubin Urine: NEGATIVE
Glucose, UA: NEGATIVE mg/dL
Ketones, ur: NEGATIVE mg/dL
Leukocytes,Ua: NEGATIVE
Nitrite: NEGATIVE
RBC / HPF: >50 RBC/hpf — ABNORMAL HIGH (ref 0–5)
Specific Gravity, Urine: 1.023 (ref 1.005–1.030)
pH: 5 (ref 5.0–8.0)

## 2021-01-31 LAB — CBC
HCT: 41.3 % (ref 36.0–46.0)
Hemoglobin: 14.1 g/dL (ref 12.0–15.0)
MCH: 30.8 pg (ref 26.0–34.0)
MCHC: 34.1 g/dL (ref 30.0–36.0)
MCV: 90.2 fL (ref 80.0–100.0)
Platelets: 199 10*3/uL (ref 150–400)
RBC: 4.58 MIL/uL (ref 3.87–5.11)
RDW: 13.9 % (ref 11.5–15.5)
WBC: 9.6 10*3/uL (ref 4.0–10.5)
nRBC: 0 % (ref 0.0–0.2)

## 2021-01-31 LAB — COMPREHENSIVE METABOLIC PANEL
ALT: 25 U/L (ref 0–44)
AST: 18 U/L (ref 15–41)
Albumin: 4.6 g/dL (ref 3.5–5.0)
Alkaline Phosphatase: 69 U/L (ref 38–126)
Anion gap: 11 (ref 5–15)
BUN: 28 mg/dL — ABNORMAL HIGH (ref 8–23)
CO2: 24 mmol/L (ref 22–32)
Calcium: 10 mg/dL (ref 8.9–10.3)
Chloride: 104 mmol/L (ref 98–111)
Creatinine, Ser: 0.67 mg/dL (ref 0.44–1.00)
GFR, Estimated: >60 mL/min (ref >60)
Glucose, Bld: 141 mg/dL — ABNORMAL HIGH (ref 70–99)
Potassium: 4.3 mmol/L (ref 3.5–5.1)
Sodium: 139 mmol/L (ref 135–145)
Total Bilirubin: 1.2 mg/dL (ref 0.3–1.2)
Total Protein: 7.1 g/dL (ref 6.5–8.1)

## 2021-01-31 LAB — LIPASE, BLOOD: Lipase: 17 U/L (ref 11–51)

## 2021-01-31 MED ORDER — TAMSULOSIN HCL 0.4 MG PO CAPS: 0.4000 mg | ORAL_CAPSULE | Freq: Every day | ORAL | 0 refills | Status: AC

## 2021-01-31 MED ORDER — NAPROXEN 375 MG PO TABS: 375.0000 mg | ORAL_TABLET | Freq: Two times a day (BID) | ORAL | 0 refills | Status: AC

## 2021-01-31 MED ORDER — IOHEXOL 300 MG/ML  SOLN
75.0000 mL | Freq: Once | INTRAMUSCULAR | Status: AC | PRN
  Administered 2021-01-31: 75 mL via INTRAVENOUS

## 2021-01-31 MED ORDER — ONDANSETRON HCL 4 MG/2ML IJ SOLN
4.0000 mg | Freq: Once | INTRAMUSCULAR | Status: AC
  Administered 2021-01-31: 4 mg via INTRAVENOUS
  Filled 2021-01-31: qty 2

## 2021-01-31 MED ORDER — HYDRALAZINE HCL 25 MG PO TABS
50.0000 mg | ORAL_TABLET | Freq: Once | ORAL | Status: AC
  Administered 2021-01-31: 50 mg via ORAL
  Filled 2021-01-31: qty 2

## 2021-01-31 NOTE — ED Provider Notes (Signed)
[facility] EMERGENCY DEPT Provider Note   CSN: [id number] Arrival date & time: 01/31/21  1619     History Chief Complaint  Patient presents with   Abdominal Pain   Nausea    Monique [name] is a 80 y.o. female.  For aboutPatient presents ER chief complaint of right sided abdominal pain ongoing x8 hours today.  Describes a sharp aching pain in the right lower quadrant region.  Does not radiate elsewhere.  No associated fevers no cough no vomiting no diarrhea.      Past Medical History:  Diagnosis Date   Anemia     mild as per PCP   Anginal pain (HCC)    06/20/20, relieved    Arthritis    hip/ s/p recent shoulder fracture 8/12- RIGHT   Asthma    Coronary artery disease 05/30/2018   LAD PCI/DES   GI bleeding    Hiatal hernia    Humerus fracture    with wrist on right side   Hypertension    hypercholesterolemia/  EKG on chart with clearance and note 06/18/11  Mazzocchi   Pneumonia     Patient Active Problem List   Diagnosis Date Noted   Ureteral stone with hydronephrosis 06/21/2020   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   CAD in native artery 11/17/2018   Anemia 06/08/2018   Redness and swelling of upper arm 06/08/2018   CAD S/P percutaneous coronary angioplasty 05/30/2018   Chest pain 05/27/2018   Sinus tachycardia 08/20/2017   Dyslipidemia, goal LDL below 70 08/20/2017   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   Essential hypertension    Primary osteoarthritis of right hip 07/08/2011    Past Surgical History:  Procedure Laterality Date   ABDOMINAL HYSTERECTOMY     COLECTOMY WITH COLOSTOMY CREATION/HARTMANN PROCEDURE N/A 05/07/2016   Procedure: COLOSTOMY CREATION/HARTMANN PROCEDURE;  Surgeon: Romie Levee, MD;  Location: [facility];  Service: General;  Laterality: N/A;   COLONOSCOPY N/A 08/26/2016   Procedure: COLONOSCOPY;  Surgeon: Romie Levee, MD;  Location: [facility];  Service: Endoscopy;  Laterality: N/A;   COLOSTOMY TAKEDOWN  N/A 08/27/2016   Procedure: LAPAROSCOPIC COLOSTOMY REVERSAL;  Surgeon: Romie Levee, MD;  Location: [facility];  Service: General;  Laterality: N/A;   CORONARY ARTERY BYPASS GRAFT N/A 11/21/2018   Procedure: CORONARY ARTERY BYPASS GRAFTING (CABG) x Three , using left internal mammary artery and right leg greater saphenous vein harvested endoscopically;  Surgeon: Alleen Borne, MD;  Location: [facility] OR;  Service: Open Heart Surgery;  Laterality: N/A;   CORONARY STENT INTERVENTION N/A 05/30/2018   Procedure: CORONARY STENT INTERVENTION;  Surgeon: Runell Gess, MD;  Location: [facility] INVAS[facility] CV LAB;  Service: Cardiovascular;  Laterality: N/A;   CYSTOSCOPY WITH RETROGRADE PYELOGRAM, URETEROSCOPY AND STENT PLACEMENT Bilateral 06/21/2020   Procedure: CYSTOSCOPY WITH RETROGRADE PYELOGRAM, AND BILATERAL STENT PLACEMENT;  Surgeon: Sebastian Ache, MD;  Location: [facility];  Service: Urology;  Laterality: Bilateral;   CYSTOSCOPY WITH RETROGRADE PYELOGRAM, URETEROSCOPY AND STENT PLACEMENT Bilateral 07/12/2020   Procedure: CYSTOSCOPY WITH RETROGRADE PYELOGRAM, URETEROSCOPY AND STENT PLACEMENT;  Surgeon: Crist Fat, MD;  Location: [facility];  Service: Urology;  Laterality: Bilateral;  2 HRS   EYE SURGERY     eyelid tuck bilateral   HOLMIUM LASER APPLICATION Bilateral 07/12/2020   Procedure: HOLMIUM LASER APPLICATION;  Surgeon: Crist Fat, MD;  Location: [facility];  Service: Urology;  Laterality: Bilateral;   LAPAROTOMY N/A 05/07/2016   Procedure: EXPLORATORY LAPAROTOMY, LYLSIS OF ADHSIONS, Pam Drown  OF PERITONEAL ABSCESS;  Surgeon: Romie Levee, MD;  Location: [facility];  Service: General;  Laterality: N/A;   LEFT HEART CATH AND CORONARY ANGIOGRAPHY N/A 05/30/2018   Procedure: LEFT HEART CATH AND CORONARY ANGIOGRAPHY;  Surgeon: Runell Gess, MD;  [facility]: [facility];  Service: Cardiovascular;  Laterality: N/A;   LEFT HEART CATH AND CORONARY ANGIOGRAPHY N/A 11/17/2018   Procedure: LEFT HEART CATH  AND CORONARY ANGIOGRAPHY;  Surgeon: Runell Gess, MD;  Location: [facility];  Service: Cardiovascular;  Laterality: N/A;   PARATHYROIDECTOMY     one lobe- benign per pt   TEE WITHOUT CARDIOVERSION N/A 11/21/2018   Procedure: TRANSESOPHAGEAL ECHOCARDIOGRAM (TEE);  Surgeon: Alleen Borne, MD;  Location[facility];  Service: Open Heart Surgery;  Laterality: N/A;   TONSILLECTOMY     TOTAL HIP ARTHROPLASTY  07/08/2011   Procedure: TOTAL HIP ARTHROPLASTY;  Surgeon: Gus Rankin Aluisio;  Location: [facility];  Service: Orthopedics;  Laterality: Right;     OB History     Gravida  2   Para  2   Term      Preterm      AB      Living         SAB      IAB      Ectopic      Multiple      Live Births              Family History  Problem Relation Age of Onset   CAD Father 88       PCI    Social History   Tobacco Use   Smoking status: Never   Smokeless tobacco: Never  Vaping Use   Vaping Use: Never used  Substance Use Topics   Alcohol use: Yes    Comment: socially- 2 x year   Drug use: No    Home Medications Prior to Admission medications   Medication Sig Start Date End Date Taking? Authorizing Provider  aspirin EC 81 MG tablet Take 81 mg by mouth daily.   Yes [provider]  atorvastatin (LIPITOR) 80 MG tablet TAKE 1 TABLET BY MOUTH DAILY AT 6 PM Patient taking differently: Take 80 mg by mouth daily. 06/28/20  Yes Runell Gess, MD  carvedilol (COREG) 12.5 MG tablet Take 1 tablet (12.5 mg total) by mouth 2 (two) times daily with a meal. 10/08/20  Yes Runell Gess, MD  cetirizine (ZYRTEC) 10 MG tablet Take 10 mg by mouth at bedtime.   Yes [provider]  clopidogrel (PLAVIX) 75 MG tablet TAKE 1 TABLET BY MOUTH EVERY DAY 08/26/20  Yes Runell Gess, MD  DULoxetine (CYMBALTA) 30 MG capsule Take 90 mg by mouth daily.  07/26/19  Yes [provider]  famotidine (PEPCID) 20 MG tablet Take 20 mg by mouth daily as needed for  heartburn or indigestion.   Yes [provider]  fluticasone (FLONASE) 50 MCG/ACT nasal spray Place 2 sprays into both nostrils daily as needed for allergies.  06/20/20  Yes [provider]  lisinopril (PRINIVIL,ZESTRIL) 40 MG tablet Take 40 mg by mouth daily.   Yes [provider]  naproxen (NAPROSYN) 375 MG tablet Take 1 tablet (375 mg total) by mouth 2 (two) times daily with a meal for 10 doses. 01/31/21 02/05/21 Yes Cheryll Cockayne, MD  pantoprazole (PROTONIX) 40 MG tablet Take 1 tablet (40 mg total) by mouth 2 (two) times daily before a meal.  05/18/16  Yes Simaan, Francine Graven, PA-C  RESTASIS 0.05 % ophthalmic emulsion Place 1 drop into both eyes 2 (two) times daily.  08/20/19  Yes [provider]  tamsulosin (FLOMAX) 0.4 MG CAPS capsule Take 1 capsule (0.4 mg total) by mouth daily after supper for 5 days. 01/31/21 02/05/21 Yes Cheryll Cockayne, MD  VENTOLIN HFA 108 [phone] Base) MCG/ACT inhaler Inhale 2 puffs into the lungs every 4 (four) hours as needed for wheezing or shortness of breath.  11/02/18  Yes [provider]  Cholecalciferol (VITAMIN D PO) Take 2,000 Units by mouth daily.     [provider]  diclofenac Sodium (VOLTAREN) 1 % GEL Apply topically as needed.    [provider]  MAGNESIUM OXIDE PO Take 250 mg by mouth daily.     [provider]  nitroGLYCERIN (NITROSTAT) 0.4 MG SL tablet Place 0.4 mg under the tongue as needed.    [provider]  phenazopyridine (PYRIDIUM) 200 MG tablet Take 1 tablet (200 mg total) by mouth 3 (three) times daily as needed for pain. 07/12/20   Crist Fat, MD    Allergies    Morphine, Penicillins, and Pregabalin  Review of Systems   Review of Systems  Constitutional:  Negative for fever.  HENT:  Negative for ear pain.   Eyes:  Negative for pain.  Respiratory:  Negative for cough.   Cardiovascular:  Negative for chest pain.  Gastrointestinal:  Positive for abdominal pain.   Genitourinary:  Negative for flank pain.  Musculoskeletal:  Negative for back pain.  Skin:  Negative for rash.  Neurological:  Negative for headaches.   Physical Exam Updated Vital Signs BP (!) 189/80 (BP Location: Left Arm)   Pulse 90   Temp (!) 97.4 F (36.3 C)   Resp 20   Ht 5\' 5"  (1.651 m)   Wt 81.6 kg   SpO2 100%   BMI 29.95 kg/m   Physical Exam Constitutional:      General: She is not in acute distress.    Appearance: Normal appearance.  HENT:     Head: Normocephalic.     Nose: Nose normal.  Eyes:     Extraocular Movements: Extraocular movements intact.  Cardiovascular:     Rate and Rhythm: Normal rate.  Pulmonary:     Effort: Pulmonary effort is normal.  Abdominal:     Tenderness: There is abdominal tenderness in the right lower quadrant.  Musculoskeletal:        General: Normal range of motion.     Cervical back: Normal range of motion.  Neurological:     General: No focal deficit present.     Mental Status: She is alert. Mental status is at baseline.    ED Results / Procedures / Treatments   Labs (all labs ordered are listed, but only abnormal results are displayed) Labs Reviewed  COMPREHENSIVE METABOLIC PANEL - Abnormal; Notable for the following components:      Result Value   Glucose, Bld 141 (*)    BUN 28 (*)    All other components within normal limits  URINALYSIS, ROUTINE W REFLEX MICROSCOPIC - Abnormal; Notable for the following components:   APPearance HAZY (*)    Hgb urine dipstick LARGE (*)    Protein, ur TRACE (*)    RBC / HPF >50 (*)    All other components within normal limits  LIPASE, BLOOD  CBC    EKG None  Radiology No results found.  Procedures Procedures  Medications Ordered in ED Medications  ondansetron (ZOFRAN) injection 4 mg (has no administration in time range)  iohexol (OMNIPAQUE) 300 MG/ML solution 75 mL (75 mLs Intravenous Contrast Given 01/31/21 1958)  hydrALAZINE (APRESOLINE) tablet 50 mg (50 mg Oral Given  01/31/21 2016)    ED Course  I have reviewed the triage vital signs and the nursing notes.  Pertinent labs & imaging results that were available during my care of the patient were reviewed by me and considered in my medical decision making (see chart for details).    MDM Rules/Calculators/A&P                          Labs show normal hemoglobin normal white count.  Chemistry normal as well.  Alysis shows blood but no evidence of infection.  CT imaging consistent with right-sided kidney stone 6 mm with moderate to prominent hydronephrosis.  Patient offered pain medicine but declined.  Prefers to take Tylenol and Motrin as needed.  Recommend outpatient follow-up with urology within 2 to 3 days.  Recommend immediate return for worsening pain fevers or any additional concerns.  Final Clinical Impression(s) / ED Diagnoses Final diagnoses:  Kidney stone    Rx / DC Orders ED Discharge Orders          Ordered    naproxen (NAPROSYN) 375 MG tablet  2 times daily with meals        01/31/21 2133    tamsulosin (FLOMAX) 0.4 MG CAPS capsule  Daily after supper        01/31/21 2133             Cheryll CockayneHong, Shatonia Hoots S, MD 01/31/21 2137

## 2021-01-31 NOTE — Discharge Instructions (Addendum)
Call your primary care doctor or specialist as discussed in the next 2-3 days.   Return immediately back to the ER if:  Your symptoms worsen within the next 12-24 hours. You develop new symptoms such as new fevers, persistent vomiting, new pain, shortness of breath, or new weakness or numbness, or if you have any other concerns.  

## 2021-01-31 NOTE — ED Triage Notes (Signed)
RLQ abdominal pain and c/o nausea for 5 hours.

## 2021-02-16 ENCOUNTER — Other Ambulatory Visit: Payer: Self-pay | Admitting: Cardiovascular Disease

## 2021-09-04 ENCOUNTER — Other Ambulatory Visit: Payer: Self-pay | Admitting: Cardiovascular Disease

## 2021-09-11 ENCOUNTER — Other Ambulatory Visit: Payer: Self-pay

## 2021-09-11 ENCOUNTER — Other Ambulatory Visit: Payer: Self-pay | Admitting: Cardiovascular Disease

## 2021-09-11 MED ORDER — ATORVASTATIN CALCIUM 80 MG PO TABS: 80.0000 mg | ORAL_TABLET | Freq: Every day | ORAL | 0 refills | Status: DC

## 2021-09-12 ENCOUNTER — Telehealth: Payer: Self-pay

## 2021-09-12 NOTE — Telephone Encounter (Signed)
° °  Pre-operative Risk Assessment    Patient Name: Monique [name]  DOB: [birth year] MRN: [id number]      Request for Surgical Clearance    Procedure:   Left total knee   Date of Surgery:  Clearance 02/16/22                                 Surgeon:  Dr.Frank Aluisio Surgeon's Group or Practice Name:  [facility] Phone number:  [phone] Fax number:  [phone]   Type of Clearance Requested:   - Medical  - Pharmacy:  Hold Aspirin and Clopidogrel (Plavix)     Type of Anesthesia:   Choice   Additional requests/questions:  Please advise surgeon/provider what medications should be held. Please fax a copy of clearance to the surgeon's office.  Vangie Bicker   09/12/2021, 5:13 PM

## 2021-09-14 NOTE — Telephone Encounter (Signed)
° °  Name: Monique [name]  DOB: 05-May-1941  MRN: [id number]  Primary Cardiologist: [name], MD  Chart reviewed as part of pre-operative protocol coverage. Because of Douglas T Henckel's past medical history and time since last visit, she will require a follow-up visit in order to better assess preoperative cardiovascular risk.  Pre-op covering staff: - Please schedule appointment and call patient to inform them. If patient already had an upcoming appointment within acceptable timeframe, please add "pre-op clearance" to the appointment notes so provider is aware. - Please contact requesting surgeon's office via preferred method (i.e, phone, fax) to inform them of need for appointment prior to surgery.  If applicable, this message will also be routed to pharmacy pool and/or primary cardiologist for input on holding anticoagulant/antiplatelet agent as requested below so that this information is available to the clearing provider at time of patient's appointment.   [place], [place]  09/14/2021, 4:41 PM

## 2021-09-15 NOTE — Telephone Encounter (Signed)
Spoke with patient and scheduled a follow up with Almyra Deforest on 11/12/21 at 3:10pm. Patient agreeable with date and time.

## 2021-11-10 ENCOUNTER — Other Ambulatory Visit: Payer: Self-pay | Admitting: Cardiovascular Disease

## 2021-11-12 ENCOUNTER — Encounter: Payer: Self-pay | Admitting: Physician Assistant

## 2021-11-12 ENCOUNTER — Ambulatory Visit (INDEPENDENT_AMBULATORY_CARE_PROVIDER_SITE_OTHER): Payer: Medicare Other | Admitting: Physician Assistant

## 2021-11-12 VITALS — BP 145/82 | HR 72 | Ht 65.0 in | Wt 188.0 lb

## 2021-11-12 DIAGNOSIS — I10 Essential (primary) hypertension: Secondary | ICD-10-CM

## 2021-11-12 DIAGNOSIS — E785 Hyperlipidemia, unspecified: Secondary | ICD-10-CM | POA: Diagnosis not present

## 2021-11-12 DIAGNOSIS — R7303 Prediabetes: Secondary | ICD-10-CM | POA: Diagnosis not present

## 2021-11-12 DIAGNOSIS — Z01810 Encounter for preprocedural cardiovascular examination: Secondary | ICD-10-CM

## 2021-11-12 DIAGNOSIS — I25810 Atherosclerosis of coronary artery bypass graft(s) without angina pectoris: Secondary | ICD-10-CM

## 2021-11-12 NOTE — Progress Notes (Signed)
?Cardiology Office Note:   ? ?Date:  11/14/2021  ? ?ID:  Monique [name], DOB 1941/06/17, MRN [id number] ? ?PCP:  Vicenta Aly, FNP ?  ?[facility] Providers ?Cardiologist:  Quay Burow, MD    ? ?Referring MD: Vicenta Aly, [place]  ? ?Chief Complaint  ?Patient presents with  ? Follow-up  ?  Seen for Dr. Gwenlyn Found  ? ? ?History of Present Illness:   ? ?Monique [name] is a 81 y.o. female with a hx of HTN, HLD, CAD s/p CABG and history of asymptomatic sinus tachycardia.  She has strong family history of CAD with her father died of MI at age 33.  She underwent diagnostic cardiac catheterization on 05/30/2018 which revealed high-grade calcified proximal LAD lesion treated with 2.5 x 20 mm Synergy DES, 80% small ramus lesion and 40% ostial OM1 were treated medically.  She was also anemic with hemoglobin of 8.1 at the time.  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  She eventually underwent CABG x3 by Dr. Cyndia Bent on 11/21/2018 with LIMA-LAD, SVG-OM1, and SVG-ramus.  Postop course was uncomplicated. ? ?On the previous office visit, she has a right radial bruit.  She did undergo significant depression after her husband passed away.  She was working with a psychiatrist.  Patient was last seen by Dr. Gwenlyn Found in March 2022 at which time she was doing well.  Patient presents today for 1 year follow-up and preoperative clearance. Her depression has significant improved since the last visit. She made some new friends. She has upcoming knee surgery.  She denies any recent chest pain or worsening dyspnea.  She is working with a Physiological scientist and has not had any recent exertional chest discomfort.  She did have COVID during Christmas last year, however her breathing is finally recovered.  She may proceed with her surgery.  She will need to hold aspirin and Plavix for 5 days prior to the surgery and  restart as once possible afterward at the surgeon's discretion.  She is overdue for CMP, fasting lipid panel and a hemoglobin A1c.  Otherwise she can follow-up in 1 year. ? ?Past Medical History:  ?Diagnosis Date  ? Anemia   ?  mild as per PCP  ? Anginal pain ([place])   ? 06/20/20, relieved   ? Arthritis   ? hip/ s/p recent shoulder fracture 8/12- RIGHT  ? Asthma   ? Coronary artery disease 05/30/2018  ? LAD PCI/DES  ? GI bleeding   ? Hiatal hernia   ? Humerus fracture   ? with wrist on right side  ? Hypertension   ? hypercholesterolemia/  EKG on chart with clearance and note 06/18/11  Mazzocchi  ? Pneumonia   ? ? ?Past Surgical History:  ?Procedure Laterality Date  ? ABDOMINAL HYSTERECTOMY    ? COLECTOMY WITH COLOSTOMY CREATION/HARTMANN PROCEDURE N/A 05/07/2016  ? Procedure: COLOSTOMY CREATION/HARTMANN PROCEDURE;  Surgeon: Leighton Ruff, MD;  [facility]: [facility];  Service: General;  Laterality: N/A;  ? COLONOSCOPY N/A 08/26/2016  ? Procedure: COLONOSCOPY;  Surgeon: Leighton Ruff, MD;  [facility];  Service: Endoscopy;  Laterality: N/A;  ? COLOSTOMY TAKEDOWN N/A 08/27/2016  ? Procedure: LAPAROSCOPIC COLOSTOMY REVERSAL;  Surgeon: Leighton Ruff, MD;  [facility];  Service: General;  Laterality: N/A;  ? CORONARY ARTERY BYPASS GRAFT N/A 11/21/2018  ? Procedure: CORONARY ARTERY BYPASS GRAFTING (CABG) x Three , using left internal mammary artery and right leg  greater saphenous vein harvested endoscopically;  Surgeon: Gaye Pollack, MD;  Location: [facility] OR;  Service: Open Heart Surgery;  Laterality: N/A;  ? CORONARY STENT INTERVENTION N/A 05/30/2018  ? Procedure: CORONARY STENT INTERVENTION;  Surgeon: Lorretta Harp, MD;  Location: [place] [facility];  Service: Cardiovascular;  Laterality: N/A;  ? CYSTOSCOPY WITH RETROGRADE PYELOGRAM, URETEROSCOPY AND STENT PLACEMENT Bilateral 06/21/2020  ? Procedure: CYSTOSCOPY WITH RETROGRADE PYELOGRAM, AND BILATERAL STENT PLACEMENT;  Surgeon: Alexis Frock, MD;  [facility];  Service: Urology;  Laterality: Bilateral;  ? CYSTOSCOPY WITH RETROGRADE PYELOGRAM, URETEROSCOPY AND STENT PLACEMENT Bilateral 07/12/2020  ? Procedure: [place], URETEROSCOPY AND STENT PLACEMENT;  Surgeon: Ardis Hughs, MD;  [facility];  Service: Urology;  Laterality: Bilateral;  2 HRS  ? EYE SURGERY    ? eyelid tuck bilateral  ? HOLMIUM LASER APPLICATION Bilateral [id number]  ? Procedure: HOLMIUM LASER APPLICATION;  Surgeon: Ardis Hughs, MD;  [facility];  Service: Urology;  Laterality: Bilateral;  ? LAPAROTOMY N/A 05/07/2016  ? Procedure: EXPLORATORY LAPAROTOMY, LYLSIS OF ADHSIONS, Alcide Clever OF PERITONEAL ABSCESS;  Surgeon: Leighton Ruff, MD;  [facility];  Service: General;  Laterality: N/A;  ? LEFT HEART CATH AND CORONARY ANGIOGRAPHY N/A 05/30/2018  ? Procedure: LEFT HEART CATH AND CORONARY ANGIOGRAPHY;  Surgeon: Lorretta Harp, MD;  Location: [place] [facility];  Service: Cardiovascular;  Laterality: N/A;  ? LEFT HEART CATH AND CORONARY ANGIOGRAPHY N/A 11/17/2018  ? Procedure: LEFT HEART CATH AND CORONARY ANGIOGRAPHY;  Surgeon: Lorretta Harp, MD;  Location: [place] [facility];  Service: Cardiovascular;  Laterality: N/A;  ? PARATHYROIDECTOMY    ? one lobe- benign per pt  ? TEE WITHOUT CARDIOVERSION N/A 11/21/2018  ? Procedure: TRANSESOPHAGEAL ECHOCARDIOGRAM (TEE);  Surgeon: Gaye Pollack, MD;  Location: [place];  Service: Open Heart Surgery;  Laterality: N/A;  ? TONSILLECTOMY    ? TOTAL HIP ARTHROPLASTY  07/08/2011  ? Procedure: TOTAL HIP ARTHROPLASTY;  Surgeon: Gearlean Alf;  [facility];  Service: Orthopedics;  Laterality: Right;  ? ? ?Current Medications: ?Current Meds  ?Medication Sig  ? aspirin EC 81 MG tablet Take 81 mg by mouth daily.  ? atorvastatin (LIPITOR) 80 MG tablet Take 1 tablet (80 mg total) by mouth daily. Patient need a appointment for future refill. 2 attempt  ? budesonide-formoterol (SYMBICORT) 80-4.5 MCG/ACT inhaler 2  puffs  ? carvedilol (COREG) 12.5 MG tablet Take 1 tablet (12.5 mg total) by mouth 2 (two) times daily with a meal.  ? cetirizine (ZYRTEC) 10 MG tablet Take 10 mg by mouth at bedtime.  ? Cholecalciferol (VITAMIN D PO) Take 2,000 Units by mouth daily.   ? clopidogrel (PLAVIX) 75 MG tablet TAKE 1 TABLET BY MOUTH EVERY DAY  ? diclofenac Sodium (VOLTAREN) 1 % GEL Apply topically as needed.  ? famotidine (PEPCID) 20 MG tablet Take 20 mg by mouth daily as needed for heartburn or indigestion.  ? fluticasone (FLONASE) 50 MCG/ACT nasal spray Place 2 sprays into both nostrils daily as needed for allergies.   ? lisinopril (PRINIVIL,ZESTRIL) 40 MG tablet Take 40 mg by mouth daily.  ? MAGNESIUM OXIDE PO Take 250 mg by mouth daily.   ? naloxone (NARCAN) nasal spray 4 mg/0.1 mL naloxone 4 mg/actuation nasal spray  ? nitroGLYCERIN (NITROSTAT) 0.4 MG SL tablet Place 0.4 mg under the tongue as needed.  ? pantoprazole (PROTONIX) 40 MG tablet Take 1 tablet (40 mg total) by mouth 2 (  two) times daily before a meal.  ?  ? ?Allergies:   Morphine, Penicillins, and Pregabalin  ? ?Social History  ? ?Socioeconomic History  ? Marital status: Widowed  ?  Spouse name: Not on file  ? Number of children: Not on file  ? Years of education: Not on file  ? Highest education level: Not on file  ?Occupational History  ? Not on file  ?Tobacco Use  ? Smoking status: Never  ? Smokeless tobacco: Never  ?Vaping Use  ? Vaping Use: Never used  ?Substance and Sexual Activity  ? Alcohol use: Yes  ?  Comment: socially- 2 x year  ? Drug use: No  ? Sexual activity: Not on file  ?  Comment: Hysterectomy  ?Other Topics Concern  ? Not on file  ?Social History Narrative  ? Not on file  ? ?Social Determinants of Health  ? ?Financial Resource Strain: Not on file  ?Food Insecurity: Not on file  ?Transportation Needs: Not on file  ?Physical Activity: Not on file  ?Stress: Not on file  ?Social Connections: Not on file  ?  ? ?Family History: ?The patient's family history  includes CAD (age of onset: 39) in her father. ? ?ROS:   ?Please see the history of present illness.    ? All other systems reviewed and are negative. ? ?EKGs/Labs/Other Studies Reviewed:   ? ?The following s

## 2021-11-12 NOTE — Patient Instructions (Addendum)
Medication Instructions:  ?HOLD Aspirin and Plavix 5 days prior to surgery restart as soon as possible at the discretion  ? ?Your physician recommends that you continue on your current medications as directed. Please refer to the Current Medication list given to you today. ? ?*If you need a refill on your cardiac medications before your next appointment, please call your pharmacy* ? ?Lab Work: ?Your physician recommends that you return for lab work anytime after surgery:  ?CMP ?Fasting Lipid Panel-DO NOT eat or drink past midnight. May have water to drink. ?HgA1c ? ?If you have labs (blood work) drawn today and your tests are completely normal, you will receive your results only by: ?MyChart Message (if you have MyChart) OR ?A paper copy in the mail ?If you have any lab test that is abnormal or we need to change your treatment, we will call you to review the results. ? ?Testing/Procedures: ?NONE ordered at this time of appointment  ? ?Follow-Up: ?At [facility], you and your health needs are our priority.  As part of our continuing mission to provide you with exceptional heart care, we have created designated Provider Care Teams.  These Care Teams include your primary Cardiologist (physician) and Advanced Practice Providers (APPs -  Physician Assistants and Nurse Practitioners) who all work together to provide you with the care you need, when you need it. ? ?Your next appointment:   ?1 year(s) ? ?The format for your next appointment:   ?In Person ? ?Provider:   ?[name], MD   ? ? ?Other Instructions ? ? ?Important Information About Sugar ? ? ? ? ? ? ?

## 2021-11-13 NOTE — Telephone Encounter (Signed)
? ? ?  Patient Name: Monique [name]  ?DOB: 01/20/1941 ?MRN: [id number] ? ?Primary Cardiologist: [name], MD ? ?Chart reviewed as part of pre-operative protocol coverage. Given past medical history and time since last visit, based on ACC/AHA guidelines, LEANI MYRON would be at acceptable risk for the planned procedure without further cardiovascular testing.  ? ?I saw the patient in the clinic on 11/12/2021, she is doing well from the cardiac perspective and may proceed with upcoming knee surgery.  She will hold aspirin and Plavix for 5 days prior to the procedure and restart as soon as possible afterward at the surgeon's discretion. ? ?The patient was advised that if she develops new symptoms prior to surgery to contact our office to arrange for a follow-up visit, and she verbalized understanding. ? ?I will route this recommendation to the requesting party via Epic fax function and remove from pre-op pool. ? ?Please call with questions. ? ?Azalee Course, [place] ?11/13/2021, 1:52 PM ? ? ?

## 2021-11-14 ENCOUNTER — Encounter: Payer: Self-pay | Admitting: Physician Assistant

## 2021-12-01 ENCOUNTER — Other Ambulatory Visit: Payer: Self-pay | Admitting: Cardiovascular Disease

## 2021-12-16 LAB — COMPREHENSIVE METABOLIC PANEL
ALT: 23 IU/L (ref 0–32)
AST: 21 IU/L (ref 0–40)
Albumin/Globulin Ratio: 2.4 — ABNORMAL HIGH (ref 1.2–2.2)
Albumin: 4.6 g/dL (ref 3.6–4.6)
Alkaline Phosphatase: 80 IU/L (ref 44–121)
BUN/Creatinine Ratio: 38 — ABNORMAL HIGH (ref 12–28)
BUN: 24 mg/dL (ref 8–27)
Bilirubin Total: 1.2 mg/dL (ref 0.0–1.2)
CO2: 23 mmol/L (ref 20–29)
Calcium: 9.4 mg/dL (ref 8.7–10.3)
Chloride: 104 mmol/L (ref 96–106)
Creatinine, Ser: 0.63 mg/dL (ref 0.57–1.00)
Globulin, Total: 1.9 g/dL (ref 1.5–4.5)
Glucose: 121 mg/dL — ABNORMAL HIGH (ref 70–99)
Potassium: 4.9 mmol/L (ref 3.5–5.2)
Sodium: 142 mmol/L (ref 134–144)
Total Protein: 6.5 g/dL (ref 6.0–8.5)
eGFR: 89 mL/min/{1.73_m2} (ref >59)

## 2021-12-16 LAB — HEMOGLOBIN A1C
Est. average glucose Bld gHb Est-mCnc: 137 mg/dL
Hgb A1c MFr Bld: 6.4 % — ABNORMAL HIGH (ref 4.8–5.6)

## 2021-12-16 LAB — LIPID PANEL
Chol/HDL Ratio: 2.5 ratio (ref 0.0–4.4)
Cholesterol, Total: 138 mg/dL (ref 100–199)
HDL: 55 mg/dL (ref >39)
LDL Chol Calc (NIH): 62 mg/dL (ref 0–99)
Triglycerides: 120 mg/dL (ref 0–149)
VLDL Cholesterol Cal: 21 mg/dL (ref 5–40)

## 2021-12-30 ENCOUNTER — Other Ambulatory Visit: Payer: Self-pay | Admitting: Cardiovascular Disease

## 2022-01-08 MED ORDER — NITROGLYCERIN 0.4 MG SL SUBL: 0.4000 mg | SUBLINGUAL_TABLET | SUBLINGUAL | 3 refills | Status: AC | PRN

## 2022-01-26 ENCOUNTER — Other Ambulatory Visit: Payer: Self-pay | Admitting: Cardiovascular Disease

## 2022-01-26 NOTE — H&P (Signed)
TOTAL KNEE ADMISSION H&P  Patient is being admitted for left total knee arthroplasty.  Subjective:  Chief Complaint: Left knee pain.  HPI: Monique [name], 81 y.o. female has a history of pain and functional disability in the left knee due to arthritis and has failed non-surgical conservative treatments for greater than 12 weeks to include corticosteriod injections and activity modification. Onset of symptoms was gradual, starting  several  years ago with gradually worsening course since that time. The patient noted no past surgery on the left knee.  Patient currently rates pain in the left knee at 8 out of 10 with activity. Patient has night pain, worsening of pain with activity and weight bearing, crepitus, and joint swelling. Patient has evidence of  bone-on-bone arthritis in the medial and patellofemoral compartments of the left knee with varus deformity  by imaging studies. There is no active infection.  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   Essential hypertension    Primary osteoarthritis of right hip 07/08/2011    Past Medical History:  Diagnosis Date   Anemia     mild as per PCP   Anginal pain ([facility])    06/20/20, relieved    Arthritis    hip/ s/p recent shoulder fracture 8/12- RIGHT   Asthma    Coronary artery disease 05/30/2018   LAD PCI/DES   GI bleeding    Hiatal hernia    Humerus fracture    with wrist on right side   Hypertension    hypercholesterolemia/  EKG on chart with clearance and note 06/18/11  Mazzocchi   Pneumonia     Past Surgical History:   Procedure Laterality Date   ABDOMINAL HYSTERECTOMY     COLECTOMY WITH COLOSTOMY CREATION/HARTMANN PROCEDURE N/A 05/07/2016   Procedure: COLOSTOMY CREATION/HARTMANN PROCEDURE;  Surgeon: Romie Levee, MD;  [facility];  Service: General;  Laterality: N/A;   COLONOSCOPY N/A 08/26/2016   Procedure: COLONOSCOPY;  Surgeon: Romie Levee, MD;  [facility];  Service: Endoscopy;  Laterality: N/A;   COLOSTOMY TAKEDOWN N/A 08/27/2016   Procedure: LAPAROSCOPIC COLOSTOMY REVERSAL;  Surgeon: Romie Levee, MD;  [facility];  Service: General;  Laterality: N/A;   CORONARY ARTERY BYPASS GRAFT N/A 11/21/2018   Procedure: CORONARY ARTERY BYPASS GRAFTING (CABG) x Three , using left internal mammary artery and right leg greater saphenous vein harvested endoscopically;  Surgeon: Alleen Borne, MD;  [facility];  Service: Open Heart Surgery;  Laterality: N/A;   CORONARY STENT INTERVENTION N/A 05/30/2018   Procedure: CORONARY STENT INTERVENTION;  Surgeon: Runell Gess, MD;  [facility];  Service: Cardiovascular;  Laterality: N/A;   CYSTOSCOPY WITH RETROGRADE PYELOGRAM, URETEROSCOPY AND STENT PLACEMENT Bilateral 06/21/2020   Procedure: CYSTOSCOPY WITH RETROGRADE PYELOGRAM, AND BILATERAL STENT PLACEMENT;  Surgeon: Sebastian Ache, MD;  [facility];  Service: Urology;  Laterality: Bilateral;   CYSTOSCOPY WITH RETROGRADE PYELOGRAM, URETEROSCOPY AND STENT PLACEMENT Bilateral 07/12/2020   Procedure: CYSTOSCOPY WITH RETROGRADE PYELOGRAM, URETEROSCOPY AND STENT PLACEMENT;  Surgeon: Crist Fat, MD;  [facility];  Service: Urology;  Laterality: Bilateral;  2 HRS   EYE SURGERY     eyelid tuck bilateral   HOLMIUM LASER APPLICATION Bilateral 07/12/2020   Procedure: HOLMIUM LASER APPLICATION;  Surgeon: Crist Fat, MD;  [facility];  Service: Urology;  Laterality: Bilateral;   LAPAROTOMY N/A 05/07/2016   Procedure: EXPLORATORY LAPAROTOMY, LYLSIS OF  ADHSIONS, Pam Drown OF PERITONEAL ABSCESS;  Surgeon: Romie Levee, MD;  [facility];  Service: General;  Laterality: N/A;   LEFT HEART CATH AND CORONARY ANGIOGRAPHY N/A 05/30/2018   Procedure: LEFT HEART CATH AND CORONARY ANGIOGRAPHY;  Surgeon: Runell Gess, MD;  [facility];  Service: Cardiovascular;  Laterality: N/A;   LEFT HEART CATH AND CORONARY ANGIOGRAPHY N/A 11/17/2018   Procedure: LEFT HEART CATH AND CORONARY ANGIOGRAPHY;  Surgeon: Runell Gess, MD;  [facility];  Service: Cardiovascular;  Laterality: N/A;   PARATHYROIDECTOMY     one lobe- benign per pt   TEE WITHOUT CARDIOVERSION N/A 11/21/2018   Procedure: TRANSESOPHAGEAL ECHOCARDIOGRAM (TEE);  Surgeon: Alleen Borne, MD;  Location: [facility];  Service: Open Heart Surgery;  Laterality: N/A;   TONSILLECTOMY     TOTAL HIP ARTHROPLASTY  07/08/2011   Procedure: TOTAL HIP ARTHROPLASTY;  Surgeon: Gus Rankin Aluisio;  [facility];  Service: Orthopedics;  Laterality: Right;    Prior to Admission medications   Medication Sig Start Date End Date Taking? Authorizing Provider  atorvastatin (LIPITOR) 80 MG tablet TAKE 1 TABLET(80 MG) BY MOUTH DAILY 12/30/21   Runell Gess, MD  carvedilol (COREG) 12.5 MG tablet TAKE 1 TABLET BY MOUTH 2 TIMES DAILY WITH A MEAL 12/01/21   Runell Gess, MD  aspirin EC 81 MG tablet Take 81 mg by mouth daily.    [provider]  budesonide-formoterol (SYMBICORT) 80-4.5 MCG/ACT inhaler 2 puffs 07/23/21   [provider]  cetirizine (ZYRTEC) 10 MG tablet Take 10 mg by mouth at bedtime.    [provider]  Cholecalciferol (VITAMIN D PO) Take 2,000 Units by mouth daily.     [provider]  clopidogrel (PLAVIX) 75 MG tablet TAKE 1 TABLET BY MOUTH EVERY DAY 09/04/21   Runell Gess, MD  diclofenac Sodium (VOLTAREN) 1 % GEL Apply topically as needed.    [provider]  DULoxetine (CYMBALTA) 30 MG capsule Take 90 mg by mouth  daily.  07/26/19   [provider]  famotidine (PEPCID) 20 MG tablet Take 20 mg by mouth daily as needed for heartburn or indigestion.    [provider]  fluticasone (FLONASE) 50 MCG/ACT nasal spray Place 2 sprays into both nostrils daily as needed for allergies.  06/20/20   [provider]  lisinopril (PRINIVIL,ZESTRIL) 40 MG tablet Take 40 mg by mouth daily.    [provider]  MAGNESIUM OXIDE PO Take 250 mg by mouth daily.     [provider]  naloxone [facility]) nasal spray 4 mg/0.1 mL naloxone 4 mg/actuation nasal spray    [provider]  nitroGLYCERIN (NITROSTAT) 0.4 MG SL tablet Place 1 tablet (0.4 mg total) under the tongue as needed. 01/08/22   Azalee Course, PA  pantoprazole (PROTONIX) 40 MG tablet Take 1 tablet (40 mg total) by mouth 2 (two) times daily before a meal. 05/18/16   Simaan, Francine Graven, PA-C  phenazopyridine (PYRIDIUM) 200 MG tablet Take 1 tablet (200 mg total) by mouth 3 (three) times daily as needed for pain. 07/12/20   Crist Fat, MD  RESTASIS 0.05 % ophthalmic emulsion Place 1 drop into both eyes 2 (two) times daily.  08/20/19   [provider]  VENTOLIN HFA 108 (90 Base) MCG/ACT inhaler Inhale 2 puffs into the lungs every 4 (four) hours as needed for wheezing or shortness of breath.  Patient not taking: Reported on 11/12/2021 11/02/18   [provider]    Allergies  Allergen Reactions   Morphine Other (See Comments)   Penicillins Other (See Comments)    UNSPECIFIED REACTION  Unknown reaction as a child.  Tolerated Zosyn 2017.   Pregabalin Anxiety    Social History   Socioeconomic History   Marital status: Widowed    Spouse name: Not on file   Number of children: Not on file   Years of education: Not on file   Highest education level: Not on file  Occupational History   Not on file  Tobacco Use   Smoking status: Never   Smokeless tobacco: Never  Vaping Use   Vaping Use: Never used   Substance and Sexual Activity   Alcohol use: Yes    Comment: socially- 2 x year   Drug use: No   Sexual activity: Not on file    Comment: Hysterectomy  Other Topics Concern   Not on file  Social History Narrative   Not on file   Social Determinants of Health   Financial Resource Strain: Not on file  Food Insecurity: Not on file  Transportation Needs: Not on file  Physical Activity: Not on file  Stress: Not on file  Social Connections: Not on file  Intimate Partner Violence: Not on file    Tobacco Use: Low Risk  (11/14/2021)   Patient History    Smoking Tobacco Use: Never    Smokeless Tobacco Use: Never    Passive Exposure: Not on file   Social History   Substance and Sexual Activity  Alcohol Use Yes   Comment: socially- 2 x year    Family History  Problem Relation Age of Onset   CAD Father 25       PCI    Review of Systems  Constitutional:  Negative for chills and fever.  HENT:  Negative for congestion, sore throat and tinnitus.   Eyes:  Negative for double vision, photophobia and pain.  Respiratory:  Negative for cough, shortness of breath and wheezing.   Cardiovascular:  Negative for chest pain, palpitations and orthopnea.  Gastrointestinal:  Negative for heartburn, nausea and vomiting.  Genitourinary:  Negative for dysuria, frequency and urgency.  Musculoskeletal:  Positive for joint pain.  Neurological:  Negative for dizziness, weakness and headaches.    Objective:  Physical Exam: Well nourished and well developed.  General: Alert and oriented x3, cooperative and pleasant, no acute distress.  Head: normocephalic, atraumatic, neck supple.  Eyes: EOMI.  Musculoskeletal:   Left Knee Exam:   No effusion present. No swelling present.   The range of motion is: 10 to 120 degrees.   Marked crepitus on range of motion of the knee.   Positive medial joint line tenderness.   No lateral joint line tenderness.   The knee is stable.   Calves soft and  nontender. Motor function intact in LE. Strength 5/5 LE bilaterally. Neuro: Distal pulses 2+. Sensation to light touch intact in LE.  Imaging Review Plain radiographs demonstrate severe degenerative joint disease of the left knee. The overall alignment is neutral. The bone quality appears to be adequate for age and reported activity level.  Assessment/Plan:  End stage arthritis, left knee   The patient history, physical examination, clinical judgment of the provider and imaging studies are consistent with end stage degenerative joint disease of the left knee and total knee arthroplasty is deemed medically necessary. The treatment options including medical management, injection therapy arthroscopy and arthroplasty were discussed at length. The risks and benefits of total knee arthroplasty were presented and reviewed. The risks due to aseptic loosening, infection, stiffness, patella tracking problems, thromboembolic complications and other imponderables were discussed. The patient acknowledged the explanation, agreed to proceed with the plan and consent was signed. Patient is being admitted for inpatient treatment for surgery, pain control, PT, OT, prophylactic antibiotics, VTE prophylaxis, progressive ambulation and ADLs and discharge planning. The patient is planning to be discharged  home .  Patient's anticipated LOS is less than 2 midnights, meeting these requirements: - Younger than 2 - Lives within 1 hour of care - Has a competent adult at home to recover with post-op recover - NO history of  - Chronic pain requiring opioids  - Diabetes  - Heart failure  - Stroke  - DVT/VTE  - Cardiac arrhythmia  - Respiratory Failure/COPD  - Renal failure  - Anemia  - Advanced Liver disease  Therapy Plans: Outpatient therapy at [facility] in [place] Disposition: Home with son, daughter and friend Planned DVT Prophylaxis: Plavix & 325 mg ASA QD DME Needed: None PCP: Aleatha Borer, FNP  (clearance received) Cardiologist: Jeri Cos, MD (clearance received) TXA: IV Allergies: PCN (childhood rxn) Anesthesia Concerns: None BMI: 30.6 Last HgbA1c: Not diabetic  Pharmacy: [facility])  Other: - Stopping plavix 5 days prior to surgery per Dr. Allyson Sabal - Hx of CABG x 3  - Patient was instructed on what medications to stop prior to surgery. - Follow-up visit in 2 weeks with Dr. Lequita Halt - Begin physical therapy following surgery - Pre-operative lab work as pre-surgical testing - Prescriptions will be provided in hospital at time of discharge  Arther Abbott, PA-C Orthopedic Surgery [facility] [place]

## 2022-02-06 NOTE — Patient Instructions (Signed)
DUE TO SPACE LIMITATIONS, ONLY TWO VISITORS  (aged 82 and older) ARE ALLOWED TO COME WITH YOU AND STAY IN THE WAITING ROOM DURING YOUR PRE OP AND PROCEDURE.   **NO VISITORS ARE ALLOWED IN THE SHORT STAY AREA OR RECOVERY ROOM!!**  IF YOU WILL BE ADMITTED INTO THE HOSPITAL YOU ARE ALLOWED ONLY FOUR SUPPORT PEOPLE DURING VISITATION HOURS (7 AM -8PM)   The support person(s) must pass our screening, and use Hand sanitizing gel. Visitors GUEST BADGE MUST BE WORN VISIBLY  One adult visitor may remain with you overnight and MUST be in the room by 8 P.M.   You are not required to quarantine at this time prior to your surgery. However, you must do this: Hand Hygiene often Do NOT share personal items Notify your provider if you are in close contact with someone who has COVID or you develop fever 100.4 or greater, new onset of sneezing, cough, sore throat, shortness of breath or body aches.       Your procedure is scheduled on:  Monday February 16, 2022  Report to [facility] Main Entrance.  Report to admitting at:  05:45   AM  +++++Call this number if you have any questions or problems the morning of surgery [phone]  Do not eat food :After Midnight the night prior to your surgery/procedure.  After Midnight you may have the following liquids until  05:15  AM  DAY OF SURGERY  Clear Liquid Diet Water Black Coffee (sugar ok, NO MILK/CREAM OR CREAMERS)  Tea (sugar ok, NO MILK/CREAM OR CREAMERS) regular and decaf                             Plain Jell-O (NO RED)                                           Fruit ices (not with fruit pulp, NO RED)                                     Popsicles (NO RED)                                                                  Juice: apple, WHITE grape, WHITE cranberry Sports drinks like Gatorade (NO RED) Clear broth(vegetable,chicken,beef)                    The day of surgery:  Drink ONE (1) Pre-Surgery  G2 at  05:15 AM the morning of surgery.  Drink in one sitting. Do not sip.  This drink was given to you during your hospital  pre-op appointment visit. Nothing else to drink after completing the Pre-Surgery G2.   If you have questions, please contact your surgeon's office.   FOLLOW ANY ADDITIONAL PRE OP INSTRUCTIONS YOU RECEIVED FROM YOUR SURGEON'S OFFICE!!!   Oral Hygiene is also important to reduce your risk of infection.        Remember - BRUSH YOUR TEETH THE MORNING OF SURGERY WITH YOUR REGULAR TOOTHPASTE   +++  You will stop taking your PLAVIX and ASPIRIN 5 days prior to your surgery.   Take ONLY these medicines the morning of surgery with A SIP OF WATER: Cymbalta, Carvedilol (Coreg), Restasis Eye drops, Protonix. If needed, you may use Flonase, Pepcid and Symbicort Inhaler   Bring CPAP mask and tubing day of surgery.                   You may not have any metal on your body including hair pins, jewelry, and body piercing  Do not wear make-up, lotions, powders, perfumes or deodorant  Do not wear nail polish including gel and S&S, artificial / acrylic nails, or any other type of covering on natural nails including finger and toenails. If you have artificial nails, gel coating, etc., that needs to be removed by a nail salon, Please have this removed prior to surgery. Not doing so may mean that your surgery could be cancelled or delayed if the Surgeon or anesthesia staff feels like they are unable to monitor you safely.   Do not shave 48 hours prior to surgery to avoid nicks in your skin which may contribute to postoperative infections.    Contacts, Hearing Aids, dentures or bridgework may not be worn into surgery.   You may bring a small overnight bag with you on the day of surgery, only pack items that are not valuable .[facility] IS NOT RESPONSIBLE   FOR VALUABLES THAT ARE LOST OR STOLEN.   DO NOT [place]. PHARMACY WILL DISPENSE MEDICATIONS LISTED ON YOUR MEDICATION LIST TO YOU  DURING YOUR ADMISSION [place]!   Special Instructions: Bring a copy of your healthcare power of attorney and living will documents the day of surgery, if you wish to have them scanned into your [facility] Records- EPIC  Please read over the following fact sheets you were given: IF YOU HAVE QUESTIONS ABOUT YOUR PRE-OP INSTRUCTIONS, PLEASE CALL [phone]  ([place])   [place] - Preparing for Surgery Before surgery, you can play an important role.  Because skin is not sterile, your skin needs to be as free of germs as possible.  You can reduce the number of germs on your skin by washing with CHG (chlorahexidine gluconate) soap before surgery.  CHG is an antiseptic cleaner which kills germs and bonds with the skin to continue killing germs even after washing. Please DO NOT use if you have an allergy to CHG or antibacterial soaps.  If your skin becomes reddened/irritated stop using the CHG and inform your nurse when you arrive at Short Stay. Do not shave (including legs and underarms) for at least 48 hours prior to the first CHG shower.  You may shave your face/neck.  Please follow these instructions carefully:  1.  Shower with CHG Soap the night before surgery and the  morning of surgery.  2.  If you choose to wash your hair, wash your hair first as usual with your normal  shampoo.  3.  After you shampoo, rinse your hair and body thoroughly to remove the shampoo.                             4.  Use CHG as you would any other liquid soap.  You can apply chg directly to the skin and wash.  Gently with a scrungie or clean washcloth.  5.  Apply the CHG Soap to your body  ONLY FROM THE NECK DOWN.   Do not use on face/ open                           Wound or open sores. Avoid contact with eyes, ears mouth and genitals (private parts).                       Wash face,  Genitals (private parts) with your normal soap.             6.  Wash thoroughly, paying special attention to the area where  your  surgery  will be performed.  7.  Thoroughly rinse your body with warm water from the neck down.  8.  DO NOT shower/wash with your normal soap after using and rinsing off the CHG Soap.            9.  Pat yourself dry with a clean towel.            10.  Wear clean pajamas.            11.  Place clean sheets on your bed the night of your first shower and do not  sleep with pets.  ON THE DAY OF SURGERY : Do not apply any lotions/deodorants the morning of surgery.  Please wear clean clothes to the hospital/surgery center.    FAILURE TO FOLLOW THESE INSTRUCTIONS MAY RESULT IN THE CANCELLATION OF YOUR SURGERY  PATIENT SIGNATURE_________________________________  NURSE SIGNATURE__________________________________  ________________________________________________________________________     Monique [name]    An incentive spirometer is a tool that can help keep your lungs clear and active. This tool measures how well you are filling your lungs with each breath. Taking long deep breaths may help reverse or decrease the chance of developing breathing (pulmonary) problems (especially infection) following: A long period of time when you are unable to move or be active. BEFORE THE PROCEDURE  If the spirometer includes an indicator to show your best effort, your nurse or respiratory therapist will set it to a desired goal. If possible, sit up straight or lean slightly forward. Try not to slouch. Hold the incentive spirometer in an upright position. INSTRUCTIONS FOR USE  Sit on the edge of your bed if possible, or sit up as far as you can in bed or on a chair. Hold the incentive spirometer in an upright position. Breathe out normally. Place the mouthpiece in your mouth and seal your lips tightly around it. Breathe in slowly and as deeply as possible, raising the piston or the ball toward the top of the column. Hold your breath for 3-5 seconds or for as long as possible. Allow the  piston or ball to fall to the bottom of the column. Remove the mouthpiece from your mouth and breathe out normally. Rest for a few seconds and repeat Steps 1 through 7 at least 10 times every 1-2 hours when you are awake. Take your time and take a few normal breaths between deep breaths. The spirometer may include an indicator to show your best effort. Use the indicator as a goal to work toward during each repetition. After each set of 10 deep breaths, practice coughing to be sure your lungs are clear. If you have an incision (the cut made at the time of surgery), support your incision when coughing by placing a pillow or rolled up towels firmly against it. Once you are able to get out of  bed, walk around indoors and cough well. You may stop using the incentive spirometer when instructed by your caregiver.  RISKS AND COMPLICATIONS Take your time so you do not get dizzy or light-headed. If you are in pain, you may need to take or ask for pain medication before doing incentive spirometry. It is harder to take a deep breath if you are having pain. AFTER USE Rest and breathe slowly and easily. It can be helpful to keep track of a log of your progress. Your caregiver can provide you with a simple table to help with this. If you are using the spirometer at home, follow these instructions: SEEK MEDICAL CARE IF:  You are having difficultly using the spirometer. You have trouble using the spirometer as often as instructed. Your pain medication is not giving enough relief while using the spirometer. You develop fever of 100.5 F (38.1 C) or higher.                                                                                                    SEEK IMMEDIATE MEDICAL CARE IF:  You cough up bloody sputum that had not been present before. You develop fever of 102 F (38.9 C) or greater. You develop worsening pain at or near the incision site. MAKE SURE YOU:  Understand these instructions. Will watch  your condition. Will get help right away if you are not doing well or get worse. Document Released: 11/30/2006 Document Revised: 10/12/2011 Document Reviewed: 01/31/2007 [facility] Main Patient Information 2014 [place], [place].    WHAT IS A BLOOD TRANSFUSION? Blood Transfusion Information  A transfusion is the replacement of blood or some of its parts. Blood is made up of multiple cells which provide different functions. Red blood cells carry oxygen and are used for blood loss replacement. White blood cells fight against infection. Platelets control bleeding. Plasma helps clot blood. Other blood products are available for specialized needs, such as hemophilia or other clotting disorders. BEFORE THE TRANSFUSION  Who gives blood for transfusions?  Healthy volunteers who are fully evaluated to make sure their blood is safe. This is blood bank blood. Transfusion therapy is the safest it has ever been in the practice of medicine. Before blood is taken from a donor, a complete history is taken to make sure that person has no history of diseases nor engages in risky social behavior (examples are intravenous drug use or sexual activity with multiple partners). The donor's travel history is screened to minimize risk of transmitting infections, such as malaria. The donated blood is tested for signs of infectious diseases, such as HIV and hepatitis. The blood is then tested to be sure it is compatible with you in order to minimize the chance of a transfusion reaction. If you or a relative donates blood, this is often done in anticipation of surgery and is not appropriate for emergency situations. It takes many days to process the donated blood. RISKS AND COMPLICATIONS Although transfusion therapy is very safe and saves many lives, the main dangers of transfusion include:  Getting an infectious disease. Developing a transfusion reaction.  This is an allergic reaction to something in the blood you were given. Every  precaution is taken to prevent this. The decision to have a blood transfusion has been considered carefully by your caregiver before blood is given. Blood is not given unless the benefits outweigh the risks. AFTER THE TRANSFUSION Right after receiving a blood transfusion, you will usually feel much better and more energetic. This is especially true if your red blood cells have gotten low (anemic). The transfusion raises the level of the red blood cells which carry oxygen, and this usually causes an energy increase. The nurse administering the transfusion will monitor you carefully for complications. HOME CARE INSTRUCTIONS  No special instructions are needed after a transfusion. You may find your energy is better. Speak with your caregiver about any limitations on activity for underlying diseases you may have. SEEK MEDICAL CARE IF:  Your condition is not improving after your transfusion. You develop redness or irritation at the intravenous (IV) site. SEEK IMMEDIATE MEDICAL CARE IF:  Any of the following symptoms occur over the next 12 hours: Shaking chills. You have a temperature by mouth above 102 F (38.9 C), not controlled by medicine. Chest, back, or muscle pain. People around you feel you are not acting correctly or are confused. Shortness of breath or difficulty breathing. Dizziness and fainting. You get a rash or develop hives. You have a decrease in urine output. Your urine turns a dark color or changes to pink, red, or brown. Any of the following symptoms occur over the next 10 days: You have a temperature by mouth above 102 F (38.9 C), not controlled by medicine. Shortness of breath. Weakness after normal activity. The white part of the eye turns yellow (jaundice). You have a decrease in the amount of urine or are urinating less often. Your urine turns a dark color or changes to pink, red, or brown. Document Released: 07/17/2000 Document Revised: 10/12/2011 Document Reviewed:  03/05/2008 [facility] Patient Information 2014 [place], [place].  _______________________________________________________________________

## 2022-02-06 NOTE — Progress Notes (Signed)
COVID Vaccine received:  []  No [x]  Yes  Date of any COVID positive Test in last 90 days:none  PCP - Brooke A. Falstreau  FNP    Dr. office [facility]   Medical Clearance on Chart, 01-19-2022  Cardiologist - Shyrl Numbers, MED          [place], [name]  11-12-21- cardiac clearance on chart   Chest x-ray - 12-2018  Epic EKG -  11-12-21  Epic Stress Test - before 1993 ECHO - Intraoperative TEE  2020  Epic Cardiac Cath - 2020    with DES   Dr. 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 Allyson Sabal  Pacemaker/ICD device last checked: Date:       [x]  N/A Spinal Cord Stimulator:[x]  No []  Yes   Other Implants:   History of Sleep Apnea? [x]  No []  Yes  []  unknown Sleep Study Date:  none  Does the patient monitor blood sugar? [x]  No []  Yes  []  N/A  Blood Thinner Instructions: Plavix, Asa 81 mg Instructions: Hold both drugs for 5 days per 11/23/18, PA  Last Dose 02-10-22  Activity level:  Can go up a flight of stairs and perform activities of daily living without stopping and       without symptoms of chest pain or shortness of breath.[x]  No []  Yes  []  N/A   (SOB per patient but she also has asthma)  Able to do exercises without symptoms []  No [x]  Yes  []  N/A  Patient able to complete ADLs without assistance []  No [x]  Yes  []  N/A    Comments:   Anesthesia review:  CABG X3, Hx Cath DES stents, HTN, DM, Anemia, Tachycardia  Patient denies shortness of breath, fever, and chest pain at PAT appointment. She does report having a chronic cough that she attributes to her allergies.  Patient verbalized understanding and agreement to the Pre-Surgical Instructions that were given to them at this PAT appointment. Patient was also educated of the need to review these PAT instructions again prior to his/her surgery.I reviewed the appropriate phone numbers to call if they have any and questions or concerns.

## 2022-02-09 ENCOUNTER — Other Ambulatory Visit: Payer: Self-pay

## 2022-02-09 ENCOUNTER — Encounter (HOSPITAL_COMMUNITY)
Admission: RE | Admit: 2022-02-09 | Discharge: 2022-02-09 | Disposition: A | Discharge status: Home / Self Care | Payer: Medicare Other | Source: Ambulatory Visit | Attending: Orthopedic Surgery | Admitting: Orthopedic Surgery

## 2022-02-09 ENCOUNTER — Encounter (HOSPITAL_COMMUNITY): Payer: Self-pay

## 2022-02-09 VITALS — BP 181/85 | HR 86 | Temp 98.7°F | Resp 20 | Ht 65.0 in | Wt 184.0 lb

## 2022-02-09 DIAGNOSIS — Z01818 Encounter for other preprocedural examination: Secondary | ICD-10-CM

## 2022-02-09 DIAGNOSIS — R7303 Prediabetes: Secondary | ICD-10-CM | POA: Insufficient documentation

## 2022-02-09 DIAGNOSIS — Z01812 Encounter for preprocedural laboratory examination: Secondary | ICD-10-CM | POA: Insufficient documentation

## 2022-02-09 HISTORY — DX: Prediabetes: R73.03

## 2022-02-09 HISTORY — DX: Personal history of urinary calculi: Z87.442

## 2022-02-09 LAB — SURGICAL PCR SCREEN
MRSA, PCR: NEGATIVE
Staphylococcus aureus: NEGATIVE

## 2022-02-09 LAB — GLUCOSE, CAPILLARY: Glucose-Capillary: 132 mg/dL — ABNORMAL HIGH (ref 70–99)

## 2022-02-11 ENCOUNTER — Emergency Department (HOSPITAL_BASED_OUTPATIENT_CLINIC_OR_DEPARTMENT_OTHER)
Admission: EM | Admit: 2022-02-11 | Discharge: 2022-02-12 | Disposition: A | Discharge status: Home / Self Care | Payer: Medicare Other | Attending: Emergency Medicine | Admitting: Emergency Medicine

## 2022-02-11 ENCOUNTER — Other Ambulatory Visit: Payer: Self-pay

## 2022-02-11 ENCOUNTER — Encounter (HOSPITAL_BASED_OUTPATIENT_CLINIC_OR_DEPARTMENT_OTHER): Payer: Self-pay

## 2022-02-11 DIAGNOSIS — K8020 Calculus of gallbladder without cholecystitis without obstruction: Secondary | ICD-10-CM | POA: Diagnosis not present

## 2022-02-11 DIAGNOSIS — Z79899 Other long term (current) drug therapy: Secondary | ICD-10-CM | POA: Insufficient documentation

## 2022-02-11 DIAGNOSIS — Z7902 Long term (current) use of antithrombotics/antiplatelets: Secondary | ICD-10-CM | POA: Diagnosis not present

## 2022-02-11 DIAGNOSIS — R1013 Epigastric pain: Secondary | ICD-10-CM | POA: Diagnosis present

## 2022-02-11 DIAGNOSIS — Z7982 Long term (current) use of aspirin: Secondary | ICD-10-CM | POA: Insufficient documentation

## 2022-02-11 DIAGNOSIS — I10 Essential (primary) hypertension: Secondary | ICD-10-CM | POA: Insufficient documentation

## 2022-02-11 LAB — CBC
HCT: 38.2 % (ref 36.0–46.0)
Hemoglobin: 12.8 g/dL (ref 12.0–15.0)
MCH: 31.1 pg (ref 26.0–34.0)
MCHC: 33.5 g/dL (ref 30.0–36.0)
MCV: 92.9 fL (ref 80.0–100.0)
Platelets: 155 10*3/uL (ref 150–400)
RBC: 4.11 MIL/uL (ref 3.87–5.11)
RDW: 13.6 % (ref 11.5–15.5)
WBC: 7.9 10*3/uL (ref 4.0–10.5)
nRBC: 0 % (ref 0.0–0.2)

## 2022-02-11 LAB — URINALYSIS, ROUTINE W REFLEX MICROSCOPIC
Bilirubin Urine: NEGATIVE
Glucose, UA: NEGATIVE mg/dL
Hgb urine dipstick: NEGATIVE
Ketones, ur: NEGATIVE mg/dL
Leukocytes,Ua: NEGATIVE
Nitrite: NEGATIVE
Protein, ur: NEGATIVE mg/dL
Specific Gravity, Urine: 1.019 (ref 1.005–1.030)
pH: 5.5 (ref 5.0–8.0)

## 2022-02-11 LAB — COMPREHENSIVE METABOLIC PANEL
ALT: 85 U/L — ABNORMAL HIGH (ref 0–44)
AST: 78 U/L — ABNORMAL HIGH (ref 15–41)
Albumin: 4.7 g/dL (ref 3.5–5.0)
Alkaline Phosphatase: 70 U/L (ref 38–126)
Anion gap: 12 (ref 5–15)
BUN: 26 mg/dL — ABNORMAL HIGH (ref 8–23)
CO2: 25 mmol/L (ref 22–32)
Calcium: 10.6 mg/dL — ABNORMAL HIGH (ref 8.9–10.3)
Chloride: 104 mmol/L (ref 98–111)
Creatinine, Ser: 0.64 mg/dL (ref 0.44–1.00)
GFR, Estimated: >60 mL/min (ref >60)
Glucose, Bld: 149 mg/dL — ABNORMAL HIGH (ref 70–99)
Potassium: 4.3 mmol/L (ref 3.5–5.1)
Sodium: 141 mmol/L (ref 135–145)
Total Bilirubin: 2.3 mg/dL — ABNORMAL HIGH (ref 0.3–1.2)
Total Protein: 6.7 g/dL (ref 6.5–8.1)

## 2022-02-11 LAB — LIPASE, BLOOD: Lipase: 26 U/L (ref 11–51)

## 2022-02-11 MED ORDER — ONDANSETRON 4 MG PO TBDP
4.0000 mg | ORAL_TABLET | Freq: Once | ORAL | Status: AC
  Administered 2022-02-11: 4 mg via ORAL
  Filled 2022-02-11: qty 1

## 2022-02-11 NOTE — ED Triage Notes (Signed)
[name] [facility] from [facility].  Patient endorses Episode of ABD Pain that was 6 Days ago and subsided with OTC Treatment. States she had another Episode of Pain today while at the [place]. Pain is Mid ABD and Non-Radiating.   No Fevers. Moderate Nausea. No Emesis. No Diarrhea.   NAD Noted during Triage. A&Ox4. GCS 15. BIB Wheelchair/Stretcher.

## 2022-02-11 NOTE — ED Provider Notes (Signed)
[facility] EMERGENCY DEPT Provider Note   CSN: [id number] Arrival date & time: 02/11/22  1557     History  Chief Complaint  Patient presents with   Abdominal Pain    Monique [name] is a 81 y.o. female.  HPI     81 year old female comes in with chief complaint of abdominal pain  Patient has history of peptic ulcer disease.  She also indicates that she uses ibuprofen regularly.  She also has history of peptic ulcer disease, hypertension and hiatal hernia.  Patient comes to the emergency room with abdominal pain.  She indicates that 6 days ago she had severe epigastric abdominal pain described as burning pain.  The pain lasted for about 2 hours and then resolved.  She was on vacation and suspected that she might have eaten unhealthy.  Thereafter she was fine until this morning.  She had heavy protein breakfast and was heading to the gym when she started having severe epigastric abdominal pain.  With this pain she was sweating profusely.  The abdominal pain is described as epigastric, burning pain, nonradiating pain.  She had nausea but no emesis.  Patient's pain was severe enough that she requested ambulance from the gym.  Patient remained in pain in the emergency room until about 730 when the pain started subsiding.    Home Medications Prior to Admission medications   Medication Sig Start Date End Date Taking? Authorizing Provider  sucralfate (CARAFATE) 1 g tablet Take your Protonix at least 30 minutes prior to your first morning dose of Carafate. 02/12/22  Yes Molpus, John, MD  aspirin EC 325 MG tablet Take 1 tablet (325 mg total) by mouth daily for 20 days. Then take one 81 mg aspirin once a day for three weeks. Then discontinue aspirin. 02/17/22 03/09/22  Edmisten, Lyn Hollingshead, PA  atorvastatin (LIPITOR) 80 MG tablet TAKE 1 TABLET(80 MG) BY MOUTH DAILY 01/27/22   Runell Gess, MD  budesonide-formoterol [facility]) 80-4.5 MCG/ACT inhaler Inhale 2 puffs into the  lungs in the morning and at bedtime. Also uses as needed for emergency purposes 07/23/21   [provider]  carvedilol (COREG) 12.5 MG tablet TAKE 1 TABLET BY MOUTH 2 TIMES DAILY WITH A MEAL 12/01/21   Runell Gess, MD  cetirizine (ZYRTEC) 10 MG tablet Take 10 mg by mouth at bedtime.    [provider]  clopidogrel (PLAVIX) 75 MG tablet TAKE 1 TABLET BY MOUTH EVERY DAY 09/04/21   Runell Gess, MD  famotidine (PEPCID AC) 10 MG tablet Take 10 mg by mouth daily as needed for heartburn or indigestion.    [provider]  gabapentin (NEURONTIN) 100 MG capsule Take 100 mg by mouth in the morning, at noon, and at bedtime. 01/19/22   [provider]  ipratropium (ATROVENT) 0.03 % nasal spray Place 2 sprays into both nostrils 3 (three) times daily as needed for rhinitis. 09/17/21   [provider]  lisinopril (PRINIVIL,ZESTRIL) 40 MG tablet Take 40 mg by mouth daily.    [provider]  methocarbamol (ROBAXIN) 500 MG tablet Take 1 tablet (500 mg total) by mouth every 6 (six) hours as needed for muscle spasms. 02/17/22   Edmisten, Lyn Hollingshead, PA  Multiple Vitamins-Minerals (MULTIVITAMIN WOMEN 50+ PO) Take 1 tablet by mouth daily.    [provider]  naloxone [facility]) nasal spray 4 mg/0.1 mL Place 0.4 mg into the nose once.    [provider]  nitroGLYCERIN (NITROSTAT) 0.4 MG SL tablet  Place 1 tablet (0.4 mg total) under the tongue as needed. 01/08/22   Almyra Deforest, PA  OVER THE COUNTER MEDICATION Apply 1 Application topically as needed (pain). Industrial/product designer, Historical, MD  oxyCODONE (OXY IR/ROXICODONE) 5 MG immediate release tablet Take 1-2 tablets (5-10 mg total) by mouth every 6 (six) hours as needed for severe pain. 02/17/22   Edmisten, Ok Anis, PA  pantoprazole (PROTONIX) 40 MG tablet Take 1 tablet (40 mg total) by mouth 2 (two) times daily before a meal. 05/18/16   Simaan, Darci Current, PA-C  Simethicone (PHAZYME  ULTIMATE) 500 MG CAPS Take 500 mg by mouth daily as needed (bloating).    [provider]  Soft Lens Products (BIOTRUE) SOLN Place 1 drop into both eyes as needed (dry eyes).    [provider]  traMADol (ULTRAM) 50 MG tablet Take 1-2 tablets (50-100 mg total) by mouth every 6 (six) hours as needed for moderate pain. 02/17/22   Edmisten, Ok Anis, PA      Allergies    Morphine, Penicillins, and Pregabalin    Review of Systems   Review of Systems  All other systems reviewed and are negative.   Physical Exam Updated Vital Signs BP (!) 176/72   Pulse 72   Temp (!) 97 F (36.1 C)   Resp 16   Ht 5\' 5"  (1.651 m)   Wt 83.5 kg   SpO2 98%   BMI 30.63 kg/m  Physical Exam Vitals and nursing note reviewed.  Constitutional:      Appearance: She is well-developed.  HENT:     Head: Atraumatic.  Cardiovascular:     Rate and Rhythm: Normal rate.  Pulmonary:     Effort: Pulmonary effort is normal.  Abdominal:     Tenderness: There is abdominal tenderness in the epigastric area. There is no guarding or rebound.  Musculoskeletal:     Cervical back: Normal range of motion and neck supple.  Skin:    General: Skin is warm and dry.  Neurological:     Mental Status: She is alert and oriented to person, place, and time.     ED Results / Procedures / Treatments   Labs (all labs ordered are listed, but only abnormal results are displayed) Labs Reviewed  COMPREHENSIVE METABOLIC PANEL - Abnormal; Notable for the following components:      Result Value   Glucose, Bld 149 (*)    BUN 26 (*)    Calcium 10.6 (*)    AST 78 (*)    ALT 85 (*)    Total Bilirubin 2.3 (*)    All other components within normal limits  LIPASE, BLOOD  CBC  URINALYSIS, ROUTINE W REFLEX MICROSCOPIC  TROPONIN I (HIGH SENSITIVITY)    EKG EKG Interpretation  Date/Time:  Wednesday February 11 2022 22:57:14 EDT Ventricular Rate:  78 PR Interval:  197 QRS Duration: 97 QT Interval:  397 QTC  Calculation: 453 R Axis:   43 Text Interpretation: Sinus rhythm Probable left atrial enlargement Abnormal R-wave progression, early transition Borderline repolarization abnormality No acute changes No significant change since last tracing Confirmed by Varney Biles [phone]) on 02/11/2022 11:46:09 PM  Radiology No results found.  Procedures Procedures    Medications Ordered in ED Medications  ondansetron (ZOFRAN-ODT) disintegrating tablet 4 mg (4 mg Oral Given 02/11/22 1639)    ED Course/ Medical Decision Making/ A&P  Medical Decision Making Amount and/or Complexity of Data Reviewed Labs: ordered. Radiology: ordered.  Risk Prescription drug management.   This patient presents to the ED with chief complaint(s) of severe abdominal pain with diaphoresis with pertinent past medical history of peptic ulcer disease for which she takes Protonix and heavy NSAID use which further complicates the presenting complaint. The complaint involves an extensive differential diagnosis and also carries with it a high risk of complications and morbidity.    The differential diagnosis includes : Pancreatitis Hepatobiliary pathology including cholecystitis Gastritis/PUD SBO ACS syndrome Aortic Dissection   Patient had some work-up initiated in triage.  Labs are reassuring.  Renal function, white count are reassuring.  Patient's lipase is normal.  I will add troponin and EKG given that she is having epigastric pain with sweating.  Additional history obtained: Additional history obtained from spouse  Independent labs interpretation:  The following labs were independently interpreted: CBC, lipase and metabolic profile are normal.  Plan is to get CT abdomen pelvis.  Incoming team to follow-up on the results.  Disposition per CT scan.  Low clinical suspicion for cholecystitis.  Patient has known history of gallstones.   Final Clinical Impression(s) / ED  Diagnoses Final diagnoses:  Epigastric pain  Calculus of gallbladder without cholecystitis without obstruction    Rx / DC Orders ED Discharge Orders          Ordered    sucralfate (CARAFATE) 1 g tablet        02/12/22 0406              Derwood Kaplan, MD 02/18/22 0016

## 2022-02-12 ENCOUNTER — Emergency Department (HOSPITAL_BASED_OUTPATIENT_CLINIC_OR_DEPARTMENT_OTHER): Payer: Medicare Other

## 2022-02-12 DIAGNOSIS — K8020 Calculus of gallbladder without cholecystitis without obstruction: Secondary | ICD-10-CM | POA: Diagnosis not present

## 2022-02-12 LAB — TROPONIN I (HIGH SENSITIVITY): Troponin I (High Sensitivity): 4 ng/L (ref <18)

## 2022-02-12 MED ORDER — SUCRALFATE 1 G PO TABS: ORAL_TABLET | ORAL | 0 refills | Status: DC

## 2022-02-12 NOTE — Progress Notes (Signed)
Anesthesia Chart Review   Case: [id number] Date/Time: 02/16/22 0805   Procedure: TOTAL KNEE ARTHROPLASTY (Left: Knee)   Anesthesia type: Choice   Pre-op diagnosis: Left knee osteoarthritis   Location: [facility]OM 10 / [facility]   Surgeons: Ollen Gross, MD       DISCUSSION:81 y.o. never smoker with h/o HTN, CAD (CABG), left knee OA scheduled for above procedure 02/16/2022 with Dr. Ollen Gross.   Pt seen by cardiology 11/12/2021. Per OV note, "Preoperative clearance: Patient has upcoming knee surgery.  She denies any recent chest discomfort.  She is working with a Systems analyst and has been able to do exercise despite his knee issues.  She is cleared to proceed with her knee surgery by holding aspirin and Plavix for 5 days prior to the procedure and restart as soon as possible afterward at the surgeon's discretion."  Anticipate pt can proceed with planned procedure barring acute status change.   VS: BP (!) 181/85   Pulse 86   Temp 37.1 C (Oral)   Resp 20   Ht 5\' 5"  (1.651 m)   Wt 83.5 kg   SpO2 98%   BMI 30.62 kg/m   PROVIDERS: , NP is PCP   Cardiologist - Jettie Pagan, MD LABS: Labs reviewed: Acceptable for surgery. (all labs ordered are listed, but only abnormal results are displayed)  Labs Reviewed  GLUCOSE, CAPILLARY - Abnormal; Notable for the following components:      Result Value   Glucose-Capillary 132 (*)    All other components within normal limits  SURGICAL PCR SCREEN  TYPE AND SCREEN     IMAGES:   EKG: 02/11/2022 Rate 78 bpm  Sinus rhythm Probable left atrial enlargement Abnormal R-wave progression, early transition Borderline repolarization abnormality No acute changes No significant change since last tracing  CV: Echo 11/17/2018 1. The left ventricle has hyperdynamic systolic function, with an  ejection fraction of >65%. The cavity size was normal. There is mild  concentric left ventricular hypertrophy. Left ventricular  diastolic  Doppler parameters are consistent with impaired  relaxation. No evidence of left ventricular regional wall motion  abnormalities.   2. The right ventricle has normal systolic function. The cavity was  normal. There is no increase in right ventricular wall thickness.   3. The aortic valve is tricuspid. Mild sclerosis of the aortic valve.  Aortic valve regurgitation was not assessed by color flow Doppler.   4. The interatrial septum appears to be lipomatous.  Past Medical History:  Diagnosis Date   Anemia     mild as per PCP   Anginal pain (HCC)    06/20/20, relieved    Arthritis    hip/ s/p recent shoulder fracture 8/12- RIGHT   Asthma    Coronary artery disease 05/30/2018   LAD PCI/DES   GI bleeding    Hiatal hernia    History of kidney stones    Last year had surgery   Humerus fracture    with wrist on right side   Hypertension    hypercholesterolemia/  EKG on chart with clearance and note 06/18/11  [place]chi   Pneumonia    Pre-diabetes     Past Surgical History:  Procedure Laterality Date   ABDOMINAL HYSTERECTOMY     COLECTOMY WITH COLOSTOMY CREATION/HARTMANN PROCEDURE N/A 05/07/2016   Procedure: COLOSTOMY CREATION/HARTMANN PROCEDURE;  Surgeon: 07/07/2016, MD;  Location: [facility];  Service: General;  Laterality: N/A;   COLONOSCOPY N/A 08/26/2016   Procedure: COLONOSCOPY;  Surgeon:  Romie Levee, MD;  Location: [facility];  Service: Endoscopy;  Laterality: N/A;   COLOSTOMY TAKEDOWN N/A 08/27/2016   Procedure: LAPAROSCOPIC COLOSTOMY REVERSAL;  Surgeon: Romie Levee, MD;  Location: [facility];  Service: General;  Laterality: N/A;   CORONARY ARTERY BYPASS GRAFT N/A 11/21/2018   Procedure: CORONARY ARTERY BYPASS GRAFTING (CABG) x Three , using left internal mammary artery and right leg greater saphenous vein harvested endoscopically;  Surgeon: Alleen Borne, MD;  Location: [facility];  Service: Open Heart Surgery;  Laterality: N/A;   CORONARY STENT INTERVENTION N/A  05/30/2018   Procedure: CORONARY STENT INTERVENTION;  Surgeon: Runell Gess, MD;  Location: [facility];  Service: Cardiovascular;  Laterality: N/A;   CYSTOSCOPY WITH RETROGRADE PYELOGRAM, URETEROSCOPY AND STENT PLACEMENT Bilateral 06/21/2020   Procedure: CYSTOSCOPY WITH RETROGRADE PYELOGRAM, AND BILATERAL STENT PLACEMENT;  Surgeon: Sebastian Ache, MD;  Location: [facility];  Service: Urology;  Laterality: Bilateral;   CYSTOSCOPY WITH RETROGRADE PYELOGRAM, URETEROSCOPY AND STENT PLACEMENT Bilateral 07/12/2020   Procedure: CYSTOSCOPY WITH RETROGRADE PYELOGRAM, URETEROSCOPY AND STENT PLACEMENT;  Surgeon: Crist Fat, MD;  Location: [facility];  Service: Urology;  Laterality: Bilateral;  2 HRS   EYE SURGERY     eyelid tuck bilateral   HOLMIUM LASER APPLICATION Bilateral 07/12/2020   Procedure: HOLMIUM LASER APPLICATION;  Surgeon: Crist Fat, MD;  Location: [facility];  Service: Urology;  Laterality: Bilateral;   LAPAROTOMY N/A 05/07/2016   Procedure: EXPLORATORY LAPAROTOMY, LYLSIS OF ADHSIONS, Pam Drown OF PERITONEAL ABSCESS;  Surgeon: Romie Levee, MD;  Location: [facility];  Service: General;  Laterality: N/A;   LEFT HEART CATH AND CORONARY ANGIOGRAPHY N/A 05/30/2018   Procedure: LEFT HEART CATH AND CORONARY ANGIOGRAPHY;  Surgeon: Runell Gess, MD;  [facility];  Service: Cardiovascular;  Laterality: N/A;   LEFT HEART CATH AND CORONARY ANGIOGRAPHY N/A 11/17/2018   Procedure: LEFT HEART CATH AND CORONARY ANGIOGRAPHY;  Surgeon: Runell Gess, MD;  [facility]: [facility];  Service: Cardiovascular;  Laterality: N/A;   PARATHYROIDECTOMY     one lobe- benign per pt   TEE WITHOUT CARDIOVERSION N/A 11/21/2018   Procedure: TRANSESOPHAGEAL ECHOCARDIOGRAM (TEE);  Surgeon: Alleen Borne, MD;  Location: [facility];  Service: Open Heart Surgery;  Laterality: N/A;   TONSILLECTOMY     TOTAL HIP ARTHROPLASTY  07/08/2011   Procedure: TOTAL HIP ARTHROPLASTY;  Surgeon: Gus Rankin Aluisio;  Location: [facility];  Service: Orthopedics;  Laterality: Right;    MEDICATIONS:  atorvastatin (LIPITOR) 80 MG tablet   budesonide-formoterol (SYMBICORT) 80-4.5 MCG/ACT inhaler   carvedilol (COREG) 12.5 MG tablet   cetirizine (ZYRTEC) 10 MG tablet   clopidogrel (PLAVIX) 75 MG tablet   famotidine (PEPCID AC) 10 MG tablet   gabapentin (NEURONTIN) 100 MG capsule   Ibuprofen-Acetaminophen (ADVIL DUAL ACTION) 125-250 MG TABS   ipratropium (ATROVENT) 0.03 % nasal spray   lisinopril (PRINIVIL,ZESTRIL) 40 MG tablet   Multiple Vitamins-Minerals (MULTIVITAMIN WOMEN 50+ PO)   naloxone (NARCAN) nasal spray 4 mg/0.1 mL   nitroGLYCERIN (NITROSTAT) 0.4 MG SL tablet   OVER THE COUNTER MEDICATION   pantoprazole (PROTONIX) 40 MG tablet   Simethicone (PHAZYME ULTIMATE) 500 MG CAPS   Soft Lens Products (BIOTRUE) SOLN   sucralfate (CARAFATE) 1 g tablet   No current facility-administered medications for this encounter.      Jodell Cipro Ward, PA-C [facility] [phone]

## 2022-02-12 NOTE — ED Provider Notes (Signed)
Nursing notes and vitals signs, including pulse oximetry, reviewed.  Summary of this visit's results, reviewed by myself:  EKG:  EKG Interpretation  Date/Time:  Wednesday February 11 2022 22:57:14 EDT Ventricular Rate:  78 PR Interval:  197 QRS Duration: 97 QT Interval:  397 QTC Calculation: 453 R Axis:   43 Text Interpretation: Sinus rhythm Probable left atrial enlargement Abnormal R-wave progression, early transition Borderline repolarization abnormality No acute changes No significant change since last tracing Confirmed by Derwood Kaplan [phone]) on 02/11/2022 11:46:09 PM        Labs:  Results for orders placed or performed during the hospital encounter of 02/11/22 (from the past 24 hour(s))  Lipase, blood     Status: None   Collection Time: 02/11/22  4:31 PM  Result Value Ref Range   Lipase 26 11 - 51 U/L  Comprehensive metabolic panel     Status: Abnormal   Collection Time: 02/11/22  4:31 PM  Result Value Ref Range   Sodium 141 135 - 145 mmol/L   Potassium 4.3 3.5 - 5.1 mmol/L   Chloride 104 98 - 111 mmol/L   CO2 25 22 - 32 mmol/L   Glucose, Bld 149 (H) 70 - 99 mg/dL   BUN 26 (H) 8 - 23 mg/dL   Creatinine, Ser 0.63 0.44 - 1.00 mg/dL   Calcium 01.6 (H) 8.9 - 10.3 mg/dL   Total Protein 6.7 6.5 - 8.1 g/dL   Albumin 4.7 3.5 - 5.0 g/dL   AST 78 (H) 15 - 41 U/L   ALT 85 (H) 0 - 44 U/L   Alkaline Phosphatase 70 38 - 126 U/L   Total Bilirubin 2.3 (H) 0.3 - 1.2 mg/dL   GFR, Estimated >01 >09 mL/min   Anion gap 12 5 - 15  CBC     Status: None   Collection Time: 02/11/22  4:31 PM  Result Value Ref Range   WBC 7.9 4.0 - 10.5 K/uL   RBC 4.11 3.87 - 5.11 MIL/uL   Hemoglobin 12.8 12.0 - 15.0 g/dL   HCT 32.3 55.7 - 32.2 %   MCV 92.9 80.0 - 100.0 fL   MCH 31.1 26.0 - 34.0 pg   MCHC 33.5 30.0 - 36.0 g/dL   RDW 02.5 42.7 - 06.2 %   Platelets 155 150 - 400 K/uL   nRBC 0.0 0.0 - 0.2 %  Urinalysis, Routine w reflex microscopic     Status: None   Collection Time: 02/11/22  4:31 PM   Result Value Ref Range   Color, Urine YELLOW YELLOW   APPearance CLEAR CLEAR   Specific Gravity, Urine 1.019 1.005 - 1.030   pH 5.5 5.0 - 8.0   Glucose, UA NEGATIVE NEGATIVE mg/dL   Hgb urine dipstick NEGATIVE NEGATIVE   Bilirubin Urine NEGATIVE NEGATIVE   Ketones, ur NEGATIVE NEGATIVE mg/dL   Protein, ur NEGATIVE NEGATIVE mg/dL   Nitrite NEGATIVE NEGATIVE   Leukocytes,Ua NEGATIVE NEGATIVE  Troponin I (High Sensitivity)     Status: None   Collection Time: 02/11/22 11:25 PM  Result Value Ref Range   Troponin I (High Sensitivity) 4 <18 ng/L    Imaging Studies: CT ABDOMEN PELVIS WO CONTRAST  Result Date: 02/12/2022 CLINICAL DATA:  Abdominal pain, nausea/vomiting EXAM: CT ABDOMEN AND PELVIS WITHOUT CONTRAST TECHNIQUE: Multidetector CT imaging of the abdomen and pelvis was performed following the standard protocol without IV contrast. RADIATION DOSE REDUCTION: This exam was performed according to the departmental dose-optimization program which includes automated exposure control, adjustment  of the mA and/or kV according to patient size and/or use of iterative reconstruction technique. COMPARISON:  01/31/2021 FINDINGS: Lower chest: Lung bases are clear. Hepatobiliary: Unenhanced liver is unremarkable. Layering gallstones with mild gallbladder wall thickening (series 2/image 32), but no pericholecystic inflammatory changes to suggest acute cholecystitis. No intrahepatic or extrahepatic ductal dilatation. Pancreas: Fatty parenchymal atrophy. Spleen: Within normal limits. Adrenals/Urinary Tract: Adrenal glands are within normal limits. Kidneys are within normal limits. No renal, ureteral, or bladder calculi. No hydronephrosis. Bladder is underdistended and partially obscured by streak artifact. Stomach/Bowel: Stomach is within normal limits. No evidence of bowel obstruction. Appendix is not discretely visualized. Status post left hemicolectomy with suture line in the left lower pelvis (series 2/image  87). No colonic wall thickening or inflammatory changes. Vascular/Lymphatic: No evidence of abdominal aortic aneurysm. Atherosclerotic calcifications of the abdominal aorta and branch vessels. No suspicious abdominopelvic lymphadenopathy. Reproductive: Status post hysterectomy. No adnexal masses. Other: No abdominopelvic ascites. Musculoskeletal: Mild degenerative changes of the lower lumbar spine. Right hip arthroplasty, without evidence of complication. IMPRESSION: Cholelithiasis with mild gallbladder wall thickening. No convincing findings to suggest acute cholecystitis. Status post left hemicolectomy. No evidence of bowel obstruction. Appendix is not discretely visualized. Electronically Signed   By: Charline Bills M.D.   On: 02/12/2022 03:22    4:04 AM Patient denies pain in her abdomen is soft and nontender.  She was advised of the CT findings showing cholelithiasis.  We will refer to general surgery for elective cholecystectomy.  Dr. Rhunette Croft was concerned that her pain is primarily gastric and may be related to gastritis or peptic ulcer disease despite already being on Protonix.  We will add Carafate.   Pervis Macintyre, Jonny Ruiz, MD 02/12/22 [phone]

## 2022-02-15 NOTE — Anesthesia Preprocedure Evaluation (Signed)
Anesthesia Evaluation  Patient identified by MRN, date of birth, ID band Patient awake    Reviewed: Allergy & Precautions, H&P , NPO status , Patient's Chart, lab work & pertinent test results  History of Anesthesia Complications Negative for: history of anesthetic complications  Airway Mallampati: II  TM Distance: >3 FB Neck ROM: Full    Dental no notable dental hx. (+) Dental Advisory Given   Pulmonary asthma ,    Pulmonary exam normal        Cardiovascular hypertension, Pt. on medications and Pt. on home beta blockers + CAD, + Cardiac Stents and + CABG  Normal cardiovascular exam  Echo 11/17/2018 1. The left ventricle has hyperdynamic systolic function, with an  ejection fraction of >65%. The cavity size was normal. There is mild  concentric left ventricular hypertrophy. Left ventricular diastolic  Doppler parameters are consistent with impaired  relaxation. No evidence of left ventricular regional wall motion  abnormalities.  2. The right ventricle has normal systolic function. The cavity was  normal. There is no increase in right ventricular wall thickness.  3. The aortic valve is tricuspid. Mild sclerosis of the aortic valve.  Aortic valve regurgitation was not assessed by color flow Doppler.  4. The interatrial septum appears to be lipomatous.    Neuro/Psych negative neurological ROS  negative psych ROS   GI/Hepatic Neg liver ROS, hiatal hernia,   Endo/Other  negative endocrine ROS  Renal/GU Renal disease  negative genitourinary   Musculoskeletal   Abdominal   Peds  Hematology  (+) Blood dyscrasia, anemia ,   Anesthesia Other Findings   Reproductive/Obstetrics negative OB ROS                            Anesthesia Physical  Anesthesia Plan  ASA: 3  Anesthesia Plan: Spinal and MAC   Post-op Pain Management: Regional block*, Celebrex PO (pre-op)* and Ofirmev IV (intra-op)*    Induction:   PONV Risk Score and Plan: 3 and Ondansetron, Dexamethasone and Propofol infusion  Airway Management Planned: Natural Airway and Simple Face Mask  Additional Equipment:   Intra-op Plan:   Post-operative Plan:   Informed Consent: I have reviewed the patients History and Physical, chart, labs and discussed the procedure including the risks, benefits and alternatives for the proposed anesthesia with the patient or authorized representative who has indicated his/her understanding and acceptance.     Dental advisory given  Plan Discussed with: Anesthesiologist and CRNA  Anesthesia Plan Comments: (Pt seen by cardiology 11/12/2021. Per OV note, "Preoperative clearance: Patient has upcoming knee surgery.  She denies any recent chest discomfort.  She is working with a Physiological scientist and has been able to do exercise despite his knee issues.  She is cleared to proceed with her knee surgery by holding aspirin and Plavix for 5 days prior to the procedure and restart as soon as possible afterward at the surgeon's discretion.")       Anesthesia Quick Evaluation

## 2022-02-16 ENCOUNTER — Other Ambulatory Visit: Payer: Self-pay

## 2022-02-16 ENCOUNTER — Ambulatory Visit (HOSPITAL_COMMUNITY): Payer: Medicare Other | Admitting: Physician Assistant

## 2022-02-16 ENCOUNTER — Ambulatory Visit (HOSPITAL_BASED_OUTPATIENT_CLINIC_OR_DEPARTMENT_OTHER): Payer: Medicare Other | Admitting: Certified Registered Nurse Anesthetist

## 2022-02-16 ENCOUNTER — Observation Stay (HOSPITAL_COMMUNITY)
Admission: RE | Admit: 2022-02-16 | Discharge: 2022-02-17 | Disposition: A | Discharge status: Home / Self Care | Payer: Medicare Other | Source: Ambulatory Visit | Attending: Orthopedic Surgery | Admitting: Orthopedic Surgery

## 2022-02-16 ENCOUNTER — Encounter (HOSPITAL_COMMUNITY)
Admission: RE | Disposition: A | Discharge status: Home / Self Care | Payer: Self-pay | Source: Ambulatory Visit | Attending: Orthopedic Surgery

## 2022-02-16 ENCOUNTER — Encounter (HOSPITAL_COMMUNITY): Payer: Self-pay | Admitting: Orthopedic Surgery

## 2022-02-16 DIAGNOSIS — J45909 Unspecified asthma, uncomplicated: Secondary | ICD-10-CM | POA: Insufficient documentation

## 2022-02-16 DIAGNOSIS — Z955 Presence of coronary angioplasty implant and graft: Secondary | ICD-10-CM | POA: Insufficient documentation

## 2022-02-16 DIAGNOSIS — I2510 Atherosclerotic heart disease of native coronary artery without angina pectoris: Secondary | ICD-10-CM

## 2022-02-16 DIAGNOSIS — I10 Essential (primary) hypertension: Secondary | ICD-10-CM

## 2022-02-16 DIAGNOSIS — Z79899 Other long term (current) drug therapy: Secondary | ICD-10-CM | POA: Insufficient documentation

## 2022-02-16 DIAGNOSIS — M1712 Unilateral primary osteoarthritis, left knee: Secondary | ICD-10-CM | POA: Diagnosis present

## 2022-02-16 DIAGNOSIS — Z7902 Long term (current) use of antithrombotics/antiplatelets: Secondary | ICD-10-CM | POA: Diagnosis not present

## 2022-02-16 DIAGNOSIS — Z96642 Presence of left artificial hip joint: Secondary | ICD-10-CM | POA: Insufficient documentation

## 2022-02-16 DIAGNOSIS — R7303 Prediabetes: Secondary | ICD-10-CM

## 2022-02-16 DIAGNOSIS — Z7982 Long term (current) use of aspirin: Secondary | ICD-10-CM | POA: Diagnosis not present

## 2022-02-16 DIAGNOSIS — Z951 Presence of aortocoronary bypass graft: Secondary | ICD-10-CM | POA: Diagnosis not present

## 2022-02-16 DIAGNOSIS — M179 Osteoarthritis of knee, unspecified: Secondary | ICD-10-CM | POA: Diagnosis present

## 2022-02-16 HISTORY — PX: TOTAL KNEE ARTHROPLASTY: SHX125

## 2022-02-16 LAB — TYPE AND SCREEN
ABO/RH(D): A POS
Antibody Screen: NEGATIVE

## 2022-02-16 SURGERY — ARTHROPLASTY, KNEE, TOTAL
Anesthesia: Monitor Anesthesia Care | Site: Knee | Laterality: Left

## 2022-02-16 MED ORDER — LACTATED RINGERS IV SOLN: INTRAVENOUS | Status: DC

## 2022-02-16 MED ORDER — ASPIRIN 325 MG PO TBEC
325.0000 mg | DELAYED_RELEASE_TABLET | Freq: Every day | ORAL | Status: DC
  Administered 2022-02-17: 325 mg via ORAL
  Filled 2022-02-16: qty 1

## 2022-02-16 MED ORDER — PROMETHAZINE HCL 25 MG/ML IJ SOLN: 6.2500 mg | INTRAMUSCULAR | Status: DC | PRN

## 2022-02-16 MED ORDER — PANTOPRAZOLE SODIUM 40 MG PO TBEC
40.0000 mg | DELAYED_RELEASE_TABLET | Freq: Two times a day (BID) | ORAL | Status: DC
  Administered 2022-02-16 – 2022-02-17 (×2): 40 mg via ORAL
  Filled 2022-02-16 (×2): qty 1

## 2022-02-16 MED ORDER — METHOCARBAMOL 500 MG PO TABS: 500.0000 mg | ORAL_TABLET | Freq: Four times a day (QID) | ORAL | Status: DC | PRN

## 2022-02-16 MED ORDER — FENTANYL CITRATE PF 50 MCG/ML IJ SOSY
50.0000 ug | PREFILLED_SYRINGE | INTRAMUSCULAR | Status: DC
  Administered 2022-02-16: 100 ug via INTRAVENOUS
  Filled 2022-02-16: qty 2

## 2022-02-16 MED ORDER — DOCUSATE SODIUM 100 MG PO CAPS
100.0000 mg | ORAL_CAPSULE | Freq: Two times a day (BID) | ORAL | Status: DC
  Administered 2022-02-16 – 2022-02-17 (×2): 100 mg via ORAL
  Filled 2022-02-16 (×2): qty 1

## 2022-02-16 MED ORDER — BUPIVACAINE LIPOSOME 1.3 % IJ SUSP: 20.0000 mL | Freq: Once | INTRAMUSCULAR | Status: DC

## 2022-02-16 MED ORDER — SODIUM CHLORIDE (PF) 0.9 % IJ SOLN
INTRAMUSCULAR | Status: AC
  Filled 2022-02-16: qty 10

## 2022-02-16 MED ORDER — CARVEDILOL 12.5 MG PO TABS
12.5000 mg | ORAL_TABLET | Freq: Two times a day (BID) | ORAL | Status: DC
  Administered 2022-02-16 – 2022-02-17 (×3): 12.5 mg via ORAL
  Filled 2022-02-16 (×3): qty 1

## 2022-02-16 MED ORDER — BUPIVACAINE LIPOSOME 1.3 % IJ SUSP
INTRAMUSCULAR | Status: DC | PRN
  Administered 2022-02-16: 20 mL

## 2022-02-16 MED ORDER — OXYCODONE HCL 5 MG PO TABS
5.0000 mg | ORAL_TABLET | ORAL | Status: DC | PRN
  Administered 2022-02-16 – 2022-02-17 (×7): 10 mg via ORAL
  Filled 2022-02-16 (×7): qty 2

## 2022-02-16 MED ORDER — NITROGLYCERIN 0.4 MG SL SUBL
0.4000 mg | SUBLINGUAL_TABLET | SUBLINGUAL | Status: DC | PRN
Start: 2022-02-16 — End: 2022-02-17

## 2022-02-16 MED ORDER — STERILE WATER FOR IRRIGATION IR SOLN
Status: DC | PRN
  Administered 2022-02-16: 2000 mL

## 2022-02-16 MED ORDER — CHLORHEXIDINE GLUCONATE 0.12 % MT SOLN
15.0000 mL | Freq: Once | OROMUCOSAL | Status: AC
  Administered 2022-02-16: 15 mL via OROMUCOSAL

## 2022-02-16 MED ORDER — CEFAZOLIN SODIUM-DEXTROSE 2-4 GM/100ML-% IV SOLN
2.0000 g | INTRAVENOUS | Status: AC
  Administered 2022-02-16: 2 g via INTRAVENOUS
  Filled 2022-02-16: qty 100

## 2022-02-16 MED ORDER — CELECOXIB 200 MG PO CAPS
200.0000 mg | ORAL_CAPSULE | Freq: Once | ORAL | Status: AC
  Administered 2022-02-16: 200 mg via ORAL
  Filled 2022-02-16: qty 1

## 2022-02-16 MED ORDER — SODIUM CHLORIDE 0.9 % IR SOLN
Status: DC | PRN
  Administered 2022-02-16: 1000 mL

## 2022-02-16 MED ORDER — ONDANSETRON HCL 4 MG/2ML IJ SOLN: 4.0000 mg | Freq: Four times a day (QID) | INTRAMUSCULAR | Status: DC | PRN

## 2022-02-16 MED ORDER — DIPHENHYDRAMINE HCL 12.5 MG/5ML PO ELIX: 12.5000 mg | ORAL_SOLUTION | ORAL | Status: DC | PRN

## 2022-02-16 MED ORDER — CLOPIDOGREL BISULFATE 75 MG PO TABS
75.0000 mg | ORAL_TABLET | Freq: Every day | ORAL | Status: DC
  Administered 2022-02-17: 75 mg via ORAL
  Filled 2022-02-16: qty 1

## 2022-02-16 MED ORDER — ATORVASTATIN CALCIUM 40 MG PO TABS
80.0000 mg | ORAL_TABLET | Freq: Every day | ORAL | Status: DC
  Administered 2022-02-17: 80 mg via ORAL
  Filled 2022-02-16: qty 2

## 2022-02-16 MED ORDER — HYDROMORPHONE HCL 1 MG/ML IJ SOLN: 0.5000 mg | INTRAMUSCULAR | Status: DC | PRN

## 2022-02-16 MED ORDER — TRAMADOL HCL 50 MG PO TABS: 50.0000 mg | ORAL_TABLET | Freq: Four times a day (QID) | ORAL | Status: DC | PRN

## 2022-02-16 MED ORDER — PHENOL 1.4 % MT LIQD: 1.0000 | OROMUCOSAL | Status: DC | PRN

## 2022-02-16 MED ORDER — HYDRALAZINE HCL 20 MG/ML IJ SOLN
10.0000 mg | Freq: Four times a day (QID) | INTRAMUSCULAR | Status: DC | PRN
Start: 2022-02-16 — End: 2022-02-17
  Administered 2022-02-16: 10 mg via INTRAVENOUS
  Filled 2022-02-16: qty 1

## 2022-02-16 MED ORDER — METOCLOPRAMIDE HCL 5 MG/ML IJ SOLN: 5.0000 mg | Freq: Three times a day (TID) | INTRAMUSCULAR | Status: DC | PRN

## 2022-02-16 MED ORDER — PROPOFOL 1000 MG/100ML IV EMUL
INTRAVENOUS | Status: AC
  Filled 2022-02-16: qty 100

## 2022-02-16 MED ORDER — METOCLOPRAMIDE HCL 5 MG PO TABS: 5.0000 mg | ORAL_TABLET | Freq: Three times a day (TID) | ORAL | Status: DC | PRN

## 2022-02-16 MED ORDER — MENTHOL 3 MG MT LOZG: 1.0000 | LOZENGE | OROMUCOSAL | Status: DC | PRN

## 2022-02-16 MED ORDER — SUCRALFATE 1 G PO TABS
1.0000 g | ORAL_TABLET | Freq: Every day | ORAL | Status: DC
Start: 2022-02-17 — End: 2022-02-17
  Administered 2022-02-17: 1 g via ORAL
  Filled 2022-02-16: qty 1

## 2022-02-16 MED ORDER — FLEET ENEMA 7-19 GM/118ML RE ENEM
1.0000 | ENEMA | Freq: Once | RECTAL | Status: DC | PRN
Start: 2022-02-16 — End: 2022-02-17

## 2022-02-16 MED ORDER — SODIUM CHLORIDE (PF) 0.9 % IJ SOLN
INTRAMUSCULAR | Status: DC | PRN
  Administered 2022-02-16: 60 mL

## 2022-02-16 MED ORDER — AMISULPRIDE (ANTIEMETIC) 5 MG/2ML IV SOLN: 10.0000 mg | Freq: Once | INTRAVENOUS | Status: DC | PRN

## 2022-02-16 MED ORDER — PROPOFOL 10 MG/ML IV BOLUS
INTRAVENOUS | Status: DC | PRN
  Administered 2022-02-16: 30 mg via INTRAVENOUS

## 2022-02-16 MED ORDER — DEXAMETHASONE SODIUM PHOSPHATE 10 MG/ML IJ SOLN
8.0000 mg | Freq: Once | INTRAMUSCULAR | Status: AC
  Administered 2022-02-16: 8 mg via INTRAVENOUS

## 2022-02-16 MED ORDER — GABAPENTIN 100 MG PO CAPS
100.0000 mg | ORAL_CAPSULE | Freq: Three times a day (TID) | ORAL | Status: DC
  Administered 2022-02-16 – 2022-02-17 (×4): 100 mg via ORAL
  Filled 2022-02-16 (×4): qty 1

## 2022-02-16 MED ORDER — SODIUM CHLORIDE (PF) 0.9 % IJ SOLN
INTRAMUSCULAR | Status: AC
  Filled 2022-02-16: qty 50

## 2022-02-16 MED ORDER — METHOCARBAMOL 500 MG IVPB - SIMPLE MED
INTRAVENOUS | Status: AC
  Administered 2022-02-16: 500 mg via INTRAVENOUS
  Filled 2022-02-16: qty 55

## 2022-02-16 MED ORDER — ONDANSETRON HCL 4 MG/2ML IJ SOLN
INTRAMUSCULAR | Status: DC | PRN
  Administered 2022-02-16: 4 mg via INTRAVENOUS

## 2022-02-16 MED ORDER — ACETAMINOPHEN 10 MG/ML IV SOLN
1000.0000 mg | Freq: Four times a day (QID) | INTRAVENOUS | Status: DC
  Administered 2022-02-16: 1000 mg via INTRAVENOUS
  Filled 2022-02-16: qty 100

## 2022-02-16 MED ORDER — ORAL CARE MOUTH RINSE: 15.0000 mL | Freq: Once | OROMUCOSAL | Status: AC

## 2022-02-16 MED ORDER — BUPIVACAINE LIPOSOME 1.3 % IJ SUSP
INTRAMUSCULAR | Status: AC
  Filled 2022-02-16: qty 20

## 2022-02-16 MED ORDER — LORATADINE 10 MG PO TABS
10.0000 mg | ORAL_TABLET | Freq: Every day | ORAL | Status: DC
  Administered 2022-02-17: 10 mg via ORAL
  Filled 2022-02-16: qty 1

## 2022-02-16 MED ORDER — SODIUM CHLORIDE 0.9 % IV SOLN: INTRAVENOUS | Status: DC

## 2022-02-16 MED ORDER — ACETAMINOPHEN 500 MG PO TABS
1000.0000 mg | ORAL_TABLET | Freq: Four times a day (QID) | ORAL | Status: AC
  Administered 2022-02-16 – 2022-02-17 (×3): 1000 mg via ORAL
  Filled 2022-02-16 (×4): qty 2

## 2022-02-16 MED ORDER — POVIDONE-IODINE 10 % EX SWAB: 2.0000 "application " | Freq: Once | CUTANEOUS | Status: DC

## 2022-02-16 MED ORDER — CEFAZOLIN SODIUM-DEXTROSE 2-4 GM/100ML-% IV SOLN
2.0000 g | Freq: Four times a day (QID) | INTRAVENOUS | Status: AC
  Administered 2022-02-16 (×2): 2 g via INTRAVENOUS
  Filled 2022-02-16 (×2): qty 100

## 2022-02-16 MED ORDER — PROPOFOL 500 MG/50ML IV EMUL
INTRAVENOUS | Status: DC | PRN
  Administered 2022-02-16: 75 ug/kg/min via INTRAVENOUS

## 2022-02-16 MED ORDER — POVIDONE-IODINE 10 % EX SWAB: Freq: Once | CUTANEOUS | Status: DC

## 2022-02-16 MED ORDER — OXYCODONE HCL 5 MG PO TABS
ORAL_TABLET | ORAL | Status: AC
  Administered 2022-02-16: 5 mg via ORAL
  Filled 2022-02-16: qty 1

## 2022-02-16 MED ORDER — MOMETASONE FURO-FORMOTEROL FUM 100-5 MCG/ACT IN AERO
2.0000 | INHALATION_SPRAY | Freq: Two times a day (BID) | RESPIRATORY_TRACT | Status: DC
  Administered 2022-02-17: 2 via RESPIRATORY_TRACT
  Filled 2022-02-16: qty 8.8

## 2022-02-16 MED ORDER — POLYETHYLENE GLYCOL 3350 17 G PO PACK: 17.0000 g | PACK | Freq: Every day | ORAL | Status: DC | PRN

## 2022-02-16 MED ORDER — PHENYLEPHRINE HCL-NACL 20-0.9 MG/250ML-% IV SOLN
INTRAVENOUS | Status: DC | PRN
  Administered 2022-02-16: 30 ug/min via INTRAVENOUS

## 2022-02-16 MED ORDER — PHENYLEPHRINE HCL-NACL 20-0.9 MG/250ML-% IV SOLN
INTRAVENOUS | Status: AC
  Filled 2022-02-16: qty 750

## 2022-02-16 MED ORDER — FENTANYL CITRATE PF 50 MCG/ML IJ SOSY: 25.0000 ug | PREFILLED_SYRINGE | INTRAMUSCULAR | Status: DC | PRN

## 2022-02-16 MED ORDER — TRANEXAMIC ACID-NACL 1000-0.7 MG/100ML-% IV SOLN
1000.0000 mg | INTRAVENOUS | Status: AC
  Administered 2022-02-16: 1000 mg via INTRAVENOUS
  Filled 2022-02-16: qty 100

## 2022-02-16 MED ORDER — ONDANSETRON HCL 4 MG PO TABS: 4.0000 mg | ORAL_TABLET | Freq: Four times a day (QID) | ORAL | Status: DC | PRN

## 2022-02-16 MED ORDER — BISACODYL 10 MG RE SUPP: 10.0000 mg | Freq: Every day | RECTAL | Status: DC | PRN

## 2022-02-16 MED ORDER — FAMOTIDINE 20 MG PO TABS
10.0000 mg | ORAL_TABLET | Freq: Every day | ORAL | Status: DC | PRN
  Administered 2022-02-17: 10 mg via ORAL
  Filled 2022-02-16: qty 1

## 2022-02-16 MED ORDER — METHOCARBAMOL 500 MG IVPB - SIMPLE MED: 500.0000 mg | Freq: Four times a day (QID) | INTRAVENOUS | Status: DC | PRN

## 2022-02-16 MED ORDER — ONDANSETRON HCL 4 MG/2ML IJ SOLN
INTRAMUSCULAR | Status: AC
  Filled 2022-02-16: qty 2

## 2022-02-16 MED ORDER — DEXAMETHASONE SODIUM PHOSPHATE 10 MG/ML IJ SOLN
INTRAMUSCULAR | Status: AC
  Filled 2022-02-16: qty 1

## 2022-02-16 MED ORDER — DEXAMETHASONE SODIUM PHOSPHATE 10 MG/ML IJ SOLN
10.0000 mg | Freq: Once | INTRAMUSCULAR | Status: AC
  Administered 2022-02-17: 10 mg via INTRAVENOUS
  Filled 2022-02-16: qty 1

## 2022-02-16 SURGICAL SUPPLY — 56 items
ATTUNE PSFEM LTSZ4 NARCEM KNEE (Femur) ×1 IMPLANT
ATTUNE PSRP INSR SZ4 8 KNEE (Insert) ×1 IMPLANT
BAG COUNTER SPONGE SURGICOUNT (BAG) ×1 IMPLANT
BAG ZIPLOCK 12X15 (MISCELLANEOUS) ×2 IMPLANT
BASE TIBIAL ROT PLAT SZ 3 KNEE (Knees) IMPLANT
BLADE SAG 18X100X1.27 (BLADE) ×2 IMPLANT
BLADE SAW SGTL 11.0X1.19X90.0M (BLADE) ×2 IMPLANT
BNDG ELASTIC 6X5.8 VLCR STR LF (GAUZE/BANDAGES/DRESSINGS) ×2 IMPLANT
BOWL SMART MIX CTS (DISPOSABLE) ×2 IMPLANT
CEMENT HV SMART SET (Cement) ×4 IMPLANT
CLSR STERI-STRIP ANTIMIC 1/2X4 (GAUZE/BANDAGES/DRESSINGS) ×1 IMPLANT
COVER SURGICAL LIGHT HANDLE (MISCELLANEOUS) ×2 IMPLANT
CUFF TOURN SGL QUICK 34 (TOURNIQUET CUFF) ×2
CUFF TRNQT CYL 34X4.125X (TOURNIQUET CUFF) ×1 IMPLANT
DRAPE INCISE IOBAN 66X45 STRL (DRAPES) ×2 IMPLANT
DRAPE U-SHAPE 47X51 STRL (DRAPES) ×2 IMPLANT
DRSG AQUACEL AG ADV 3.5X10 (GAUZE/BANDAGES/DRESSINGS) ×2 IMPLANT
DURAPREP 26ML APPLICATOR (WOUND CARE) ×2 IMPLANT
ELECT REM PT RETURN 15FT ADLT (MISCELLANEOUS) ×2 IMPLANT
GLOVE BIO SURGEON STRL SZ 6.5 (GLOVE) IMPLANT
GLOVE BIO SURGEON STRL SZ7.5 (GLOVE) IMPLANT
GLOVE BIO SURGEON STRL SZ8 (GLOVE) ×2 IMPLANT
GLOVE BIOGEL PI IND STRL 6.5 (GLOVE) IMPLANT
GLOVE BIOGEL PI IND STRL 7.0 (GLOVE) IMPLANT
GLOVE BIOGEL PI IND STRL 8 (GLOVE) ×1 IMPLANT
GLOVE BIOGEL PI INDICATOR 6.5 (GLOVE)
GLOVE BIOGEL PI INDICATOR 7.0 (GLOVE)
GLOVE BIOGEL PI INDICATOR 8 (GLOVE) ×1
GOWN STRL REUS W/ TWL LRG LVL3 (GOWN DISPOSABLE) ×1 IMPLANT
GOWN STRL REUS W/ TWL XL LVL3 (GOWN DISPOSABLE) IMPLANT
GOWN STRL REUS W/TWL LRG LVL3 (GOWN DISPOSABLE) ×2
GOWN STRL REUS W/TWL XL LVL3 (GOWN DISPOSABLE)
HANDPIECE INTERPULSE COAX TIP (DISPOSABLE) ×2
HOLDER FOLEY CATH W/STRAP (MISCELLANEOUS) ×1 IMPLANT
IMMOBILIZER KNEE 20 (SOFTGOODS) ×2
IMMOBILIZER KNEE 20 THIGH 36 (SOFTGOODS) ×1 IMPLANT
KIT TURNOVER KIT A (KITS) ×1 IMPLANT
MANIFOLD NEPTUNE II (INSTRUMENTS) ×2 IMPLANT
NS IRRIG 1000ML POUR BTL (IV SOLUTION) ×2 IMPLANT
PACK TOTAL KNEE CUSTOM (KITS) ×2 IMPLANT
PADDING CAST COTTON 6X4 STRL (CAST SUPPLIES) ×3 IMPLANT
PATELLA MEDIAL ATTUN 35MM KNEE (Knees) ×1 IMPLANT
PROTECTOR NERVE ULNAR (MISCELLANEOUS) ×2 IMPLANT
SET HNDPC FAN SPRY TIP SCT (DISPOSABLE) ×1 IMPLANT
SPIKE FLUID TRANSFER (MISCELLANEOUS) ×2 IMPLANT
STRIP CLOSURE SKIN 1/2X4 (GAUZE/BANDAGES/DRESSINGS) ×4 IMPLANT
SUT MNCRL AB 4-0 PS2 18 (SUTURE) ×2 IMPLANT
SUT STRATAFIX 0 PDS 27 VIOLET (SUTURE) ×2
SUT VIC AB 2-0 CT1 27 (SUTURE) ×6
SUT VIC AB 2-0 CT1 TAPERPNT 27 (SUTURE) ×3 IMPLANT
SUTURE STRATFX 0 PDS 27 VIOLET (SUTURE) ×1 IMPLANT
TIBIAL BASE ROT PLAT SZ 3 KNEE (Knees) ×2 IMPLANT
TRAY FOLEY MTR SLVR 16FR STAT (SET/KITS/TRAYS/PACK) ×2 IMPLANT
TUBE SUCTION HIGH CAP CLEAR NV (SUCTIONS) ×2 IMPLANT
WATER STERILE IRR 1000ML POUR (IV SOLUTION) ×4 IMPLANT
WRAP KNEE MAXI GEL POST OP (GAUZE/BANDAGES/DRESSINGS) ×2 IMPLANT

## 2022-02-16 NOTE — Progress Notes (Signed)
Patients Blood pressure 191/97 HR 76. PA Kristie Edmisten made aware, orders obtained to administer carvedilol 12.5mg  and add IV hydralazine 10mg  every 6 hours prn for BP >150/90.

## 2022-02-16 NOTE — Discharge Instructions (Signed)
Gaynelle Arabian, MD Total Joint Specialist [facility] [address], [address] [place], [place] [address] [phone]  TOTAL KNEE REPLACEMENT POSTOPERATIVE DIRECTIONS    Knee Rehabilitation, Guidelines Following Surgery  Results after knee surgery are often greatly improved when you follow the exercise, range of motion and muscle strengthening exercises prescribed by your doctor. Safety measures are also important to protect the knee from further injury. If any of these exercises cause you to have increased pain or swelling in your knee joint, decrease the amount until you are comfortable again and slowly increase them. If you have problems or questions, call your caregiver or physical therapist for advice.   BLOOD CLOT PREVENTION In addition to your plavix, take a 325 mg Aspirin one time a day for three weeks following surgery. Then take an 81 mg Aspirin once a day for three weeks. Then discontinue Aspirin. You may resume your vitamins/supplements upon discharge from the hospital.  Kindred items at home which could result in a fall. This includes throw rugs or furniture in walking pathways.  ICE to the affected knee as much as tolerated. Icing helps control swelling. If the swelling is well controlled you will be more comfortable and rehab easier. Continue to use ice on the knee for pain and swelling from surgery. You may notice swelling that will progress down to the foot and ankle. This is normal after surgery. Elevate the leg when you are not up walking on it.    Continue to use the breathing machine which will help keep your temperature down. It is common for your temperature to cycle up and down following surgery, especially at night when you are not up moving around and exerting yourself. The breathing machine keeps your lungs expanded and your temperature down. Do not place pillow under the operative knee, focus on keeping the knee straight  while resting  DIET You may resume your previous home diet once you are discharged from the hospital.  DRESSING / WOUND CARE / SHOWERING Keep your bulky bandage on for 2 days. On the third post-operative day you may remove the Ace bandage and gauze. There is a waterproof adhesive bandage on your skin which will stay in place until your first follow-up appointment. Once you remove this you will not need to place another bandage You may begin showering 3 days following surgery, but do not submerge the incision under water.  ACTIVITY For the first 5 days, the key is rest and control of pain and swelling Do your home exercises twice a day starting on post-operative day 3. On the days you go to physical therapy, just do the home exercises once that day. You should rest, ice and elevate the leg for 50 minutes out of every hour. Get up and walk/stretch for 10 minutes per hour. After 5 days you can increase your activity slowly as tolerated. Walk with your walker as instructed. Use the walker until you are comfortable transitioning to a cane. Walk with the cane in the opposite hand of the operative leg. You may discontinue the cane once you are comfortable and walking steadily. Avoid periods of inactivity such as sitting longer than an hour when not asleep. This helps prevent blood clots.  You may discontinue the knee immobilizer once you are able to perform a straight leg raise while lying down. You may resume a sexual relationship in one month or when given the OK by your doctor.  You may return to  work once you are cleared by your doctor.  Do not drive a car for 6 weeks or until released by your surgeon.  Do not drive while taking narcotics.  TED HOSE STOCKINGS Wear the elastic stockings on both legs for three weeks following surgery during the day. You may remove them at night for sleeping.  WEIGHT BEARING Weight bearing as tolerated with assist device (walker, cane, etc) as directed, use it as  long as suggested by your surgeon or therapist, typically at least 4-6 weeks.  POSTOPERATIVE CONSTIPATION PROTOCOL Constipation - defined medically as fewer than three stools per week and severe constipation as less than one stool per week.  One of the most common issues patients have following surgery is constipation.  Even if you have a regular bowel pattern at home, your normal regimen is likely to be disrupted due to multiple reasons following surgery.  Combination of anesthesia, postoperative narcotics, change in appetite and fluid intake all can affect your bowels.  In order to avoid complications following surgery, here are some recommendations in order to help you during your recovery period.  Colace (docusate) - Pick up an over-the-counter form of Colace or another stool softener and take twice a day as long as you are requiring postoperative pain medications.  Take with a full glass of water daily.  If you experience loose stools or diarrhea, hold the colace until you stool forms back up. If your symptoms do not get better within 1 week or if they get worse, check with your doctor. Dulcolax (bisacodyl) - Pick up over-the-counter and take as directed by the product packaging as needed to assist with the movement of your bowels.  Take with a full glass of water.  Use this product as needed if not relieved by Colace only.  MiraLax (polyethylene glycol) - Pick up over-the-counter to have on hand. MiraLax is a solution that will increase the amount of water in your bowels to assist with bowel movements.  Take as directed and can mix with a glass of water, juice, soda, coffee, or tea. Take if you go more than two days without a movement. Do not use MiraLax more than once per day. Call your doctor if you are still constipated or irregular after using this medication for 7 days in a row.  If you continue to have problems with postoperative constipation, please contact the office for further assistance  and recommendations.  If you experience "the worst abdominal pain ever" or develop nausea or vomiting, please contact the office immediatly for further recommendations for treatment.  ITCHING If you experience itching with your medications, try taking only a single pain pill, or even half a pain pill at a time.  You can also use Benadryl over the counter for itching or also to help with sleep.   MEDICATIONS See your medication summary on the "After Visit Summary" that the nursing staff will review with you prior to discharge.  You may have some home medications which will be placed on hold until you complete the course of blood thinner medication.  It is important for you to complete the blood thinner medication as prescribed by your surgeon.  Continue your approved medications as instructed at time of discharge.  PRECAUTIONS If you experience chest pain or shortness of breath - call 911 immediately for transfer to the hospital emergency department.  If you develop a fever greater that 101 F, purulent drainage from wound, increased redness or drainage from wound, foul odor from  the wound/dressing, or calf pain - CONTACT YOUR SURGEON.                                                   FOLLOW-UP APPOINTMENTS Make sure you keep all of your appointments after your operation with your surgeon and caregivers. You should call the office at the above phone number and make an appointment for approximately two weeks after the date of your surgery or on the date instructed by your surgeon outlined in the "After Visit Summary".  RANGE OF MOTION AND STRENGTHENING EXERCISES  Rehabilitation of the knee is important following a knee injury or an operation. After just a few days of immobilization, the muscles of the thigh which control the knee become weakened and shrink (atrophy). Knee exercises are designed to build up the tone and strength of the thigh muscles and to improve knee motion. Often times heat used for  twenty to thirty minutes before working out will loosen up your tissues and help with improving the range of motion but do not use heat for the first two weeks following surgery. These exercises can be done on a training (exercise) mat, on the floor, on a table or on a bed. Use what ever works the best and is most comfortable for you Knee exercises include:  Leg Lifts - While your knee is still immobilized in a splint or cast, you can do straight leg raises. Lift the leg to 60 degrees, hold for 3 sec, and slowly lower the leg. Repeat 10-20 times 2-3 times daily. Perform this exercise against resistance later as your knee gets better.  Quad and Hamstring Sets - Tighten up the muscle on the front of the thigh (Quad) and hold for 5-10 sec. Repeat this 10-20 times hourly. Hamstring sets are done by pushing the foot backward against an object and holding for 5-10 sec. Repeat as with quad sets.  Leg Slides: Lying on your back, slowly slide your foot toward your buttocks, bending your knee up off the floor (only go as far as is comfortable). Then slowly slide your foot back down until your leg is flat on the floor again. Angel Wings: Lying on your back spread your legs to the side as far apart as you can without causing discomfort.  A rehabilitation program following serious knee injuries can speed recovery and prevent re-injury in the future due to weakened muscles. Contact your doctor or a physical therapist for more information on knee rehabilitation.   POST-OPERATIVE OPIOID TAPER INSTRUCTIONS: It is important to wean off of your opioid medication as soon as possible. If you do not need pain medication after your surgery it is ok to stop day one. Opioids include: Codeine, Hydrocodone(Norco, Vicodin), Oxycodone(Percocet, oxycontin) and hydromorphone amongst others.  Long term and even short term use of opiods can cause: Increased pain response Dependence Constipation Depression Respiratory depression And  more.  Withdrawal symptoms can include Flu like symptoms Nausea, vomiting And more Techniques to manage these symptoms Hydrate well Eat regular healthy meals Stay active Use relaxation techniques(deep breathing, meditating, yoga) Do Not substitute Alcohol to help with tapering If you have been on opioids for less than two weeks and do not have pain than it is ok to stop all together.  Plan to wean off of opioids This plan should start within one week post  op of your joint replacement. Maintain the same interval or time between taking each dose and first decrease the dose.  Cut the total daily intake of opioids by one tablet each day Next start to increase the time between doses. The last dose that should be eliminated is the evening dose.   IF YOU ARE TRANSFERRED TO A SKILLED REHAB FACILITY If the patient is transferred to a skilled rehab facility following release from the hospital, a list of the current medications will be sent to the facility for the patient to continue.  When discharged from the skilled rehab facility, please have the facility set up the patient's [place] prior to being released. Also, the skilled facility will be responsible for providing the patient with their medications at time of release from the facility to include their pain medication, the muscle relaxants, and their blood thinner medication. If the patient is still at the rehab facility at time of the two week follow up appointment, the skilled rehab facility will also need to assist the patient in arranging follow up appointment in our office and any transportation needs.  MAKE SURE YOU:  Understand these instructions.  Get help right away if you are not doing well or get worse.   DENTAL ANTIBIOTICS:  In most cases prophylactic antibiotics for Dental procdeures after total joint surgery are not necessary.  Exceptions are as follows:  1. History of prior total joint infection  2.  Severely immunocompromised (Organ Transplant, cancer chemotherapy, Rheumatoid biologic medications such as [place])  3. Poorly controlled diabetes (A1C &gt; 8.0, blood glucose over 200)  If you have one of these conditions, contact your surgeon for an antibiotic prescription, prior to your dental procedure.    Pick up stool softner and laxative for home use following surgery while on pain medications. Do not submerge incision under water. Please use good hand washing techniques while changing dressing each day. May shower starting three days after surgery. Please use a clean towel to pat the incision dry following showers. Continue to use ice for pain and swelling after surgery. Do not use any lotions or creams on the incision until instructed by your surgeon.

## 2022-02-16 NOTE — Anesthesia Procedure Notes (Signed)
Procedure Name: MAC Date/Time: 02/16/2022 8:10 AM  Performed by: Claudia Desanctis, CRNAPre-anesthesia Checklist: Patient identified, Emergency Drugs available, Suction available and Patient being monitored Patient Re-evaluated:Patient Re-evaluated prior to induction Oxygen Delivery Method: Simple face mask

## 2022-02-16 NOTE — Interval H&P Note (Signed)
History and Physical Interval Note:  02/16/2022 6:32 AM  Monique [name]  has presented today for surgery, with the diagnosis of Left knee osteoarthritis.  The various methods of treatment have been discussed with the patient and family. After consideration of risks, benefits and other options for treatment, the patient has consented to  Procedure(s): TOTAL KNEE ARTHROPLASTY (Left) as a surgical intervention.  The patient's history has been reviewed, patient examined, no change in status, stable for surgery.  I have reviewed the patient's chart and labs.  Questions were answered to the patient's satisfaction.     [name]

## 2022-02-16 NOTE — Progress Notes (Signed)
Orthopedic Tech Progress Note Patient Details:  Monique [name] February 09, 1941 [id number]  CPM Left Knee CPM Left Knee: On Left Knee Flexion (Degrees): 40 Left Knee Extension (Degrees): 10  Post Interventions Patient Tolerated: Well Instructions Provided: Care of device, Adjustment of device  Saul Fordyce 02/16/2022, 9:32 AM

## 2022-02-16 NOTE — Anesthesia Postprocedure Evaluation (Signed)
Anesthesia Post Note  Patient: TACORA ATHANAS  Procedure(s) Performed: TOTAL KNEE ARTHROPLASTY (Left: Knee)     Patient location during evaluation: PACU Anesthesia Type: MAC Level of consciousness: awake and alert Pain management: pain level controlled Vital Signs Assessment: post-procedure vital signs reviewed and stable Respiratory status: spontaneous breathing and respiratory function stable Cardiovascular status: blood pressure returned to baseline and stable Postop Assessment: spinal receding Anesthetic complications: no   No notable events documented.  Last Vitals:  Vitals:   02/16/22 1030 02/16/22 1045  BP: (!) 166/80 (!) 181/84  Pulse: 72 66  Resp: 13 16  Temp:  (!) 36.4 C  SpO2: 97% 96%    Last Pain:  Vitals:   02/16/22 1045  TempSrc:   PainSc: 0-No pain    LLE Motor Response: Purposeful movement (02/16/22 1045) LLE Sensation: Decreased;Numbness (02/16/22 1045) RLE Motor Response: Purposeful movement (02/16/22 1045) RLE Sensation: Decreased;Numbness (02/16/22 1045) L Sensory Level: L5-Outer lower leg, top of foot, great toe (02/16/22 1045) R Sensory Level: S1-Sole of foot, small toes (02/16/22 1045)  Jammi Morrissette DANIEL

## 2022-02-16 NOTE — Evaluation (Signed)
Physical Therapy Evaluation Patient Details Name: Monique [name] MRN: [id number] DOB: Mar 24, 1941 Today's Date: 02/16/2022  History of Present Illness  81 yo female s/p L TKA on 02/16/22. PMH: CAD, CABG x3, HTN, R wrist fx, CAD, colostomy with reversal, R THA 2012  Clinical Impression  Pt is s/p TKA resulting in the deficits listed below (see PT Problem List).  Pt amb ~30 with RW  and min assist. Distance limited by pain but overall tol well. Anticipate steady progress in acute setting.  Pt will benefit from skilled PT to increase their independence and safety with mobility to allow discharge to the venue listed below.         Recommendations for follow up therapy are one component of a multi-disciplinary discharge planning process, led by the attending physician.  Recommendations may be updated based on patient status, additional functional criteria and insurance authorization.  Follow Up Recommendations Follow physician's recommendations for discharge plan and follow up therapies      Assistance Recommended at Discharge Intermittent Supervision/Assistance  Patient can return home with the following  A little help with walking and/or transfers;Help with stairs or ramp for entrance;Assist for transportation;Assistance with cooking/housework    Equipment Recommendations None recommended by PT  Recommendations for Other Services       Functional Status Assessment Patient has had a recent decline in their functional status and demonstrates the ability to make significant improvements in function in a reasonable and predictable amount of time.     Precautions / Restrictions Precautions Precautions: Fall;Knee Required Braces or Orthoses: Knee Immobilizer - Left Knee Immobilizer - Left: Discontinue once straight leg raise with < 10 degree lag Restrictions Weight Bearing Restrictions: No Other Position/Activity Restrictions: WBAT      Mobility  Bed Mobility Overal bed mobility:  Needs Assistance Bed Mobility: Supine to Sit     Supine to sit: Min assist     General bed mobility comments: assist with LLE, incr time    Transfers Overall transfer level: Needs assistance Equipment used: Rolling walker (2 wheels) Transfers: Sit to/from Stand Sit to Stand: Min assist           General transfer comment: verbal cues for hand placement and LLE position, assist to rise    Ambulation/Gait Ambulation/Gait assistance: Min guard Gait Distance (Feet): 30 Feet Assistive device: Rolling walker (2 wheels) Gait Pattern/deviations: Step-to pattern       General Gait Details: cues for sequence and RW position, educated on [employer] Mobility    Modified Rankin (Stroke Patients Only)       Balance Overall balance assessment: Mild deficits observed, not formally tested                                           Pertinent Vitals/Pain Pain Assessment Pain Assessment: 0-10 Pain Score: 4  Pain Location: L knee Pain Descriptors / Indicators: Aching, Sore, Operative site guarding Pain Intervention(s): Limited activity within patient's tolerance, Monitored during session, Premedicated before session, Repositioned, Ice applied    Home Living Family/patient expects to be discharged to:: Private residence Living Arrangements: Alone Available Help at Discharge: Family Type of Home: House Home Access: Ramped entrance       Home Layout: One level Home Equipment: Agricultural consultant (2 wheels);Cane - quad;Cane - single point;Shower seat;Grab bars -  tub/shower;Grab bars - toilet      Prior Function Prior Level of Function : Independent/Modified Independent             Mobility Comments: pt has been working out with a Chief of Staff        Extremity/Trunk Assessment   Upper Extremity Assessment Upper Extremity Assessment: Overall WFL for tasks assessed    Lower Extremity Assessment Lower  Extremity Assessment: LLE deficits/detail LLE Deficits / Details: ankle WFL, knee extension and hip flexion 2+/5, ~ 15 degree quad lag       Communication   Communication: No difficulties  Cognition Arousal/Alertness: Awake/alert Behavior During Therapy: WFL for tasks assessed/performed Overall Cognitive Status: Within Functional Limits for tasks assessed                                          General Comments      Exercises Total Joint Exercises Ankle Circles/Pumps: AROM, Both, 5 reps Quad Sets: AROM, Both, 5 reps   Assessment/Plan    PT Assessment Patient needs continued PT services  PT Problem List Decreased strength;Decreased range of motion;Decreased activity tolerance;Decreased mobility;Decreased knowledge of use of DME;Pain;Decreased balance       PT Treatment Interventions DME instruction;Therapeutic exercise;Gait training;Functional mobility training;Therapeutic activities;Patient/family education    PT Goals (Current goals can be found in the Care Plan section)  Acute Rehab PT Goals Patient Stated Goal: less pain, back to being independent PT Goal Formulation: With patient Time For Goal Achievement: 02/23/22    Frequency 7X/week     Co-evaluation               AM-PAC PT "6 Clicks" Mobility  Outcome Measure Help needed turning from your back to your side while in a flat bed without using bedrails?: A Little Help needed moving from lying on your back to sitting on the side of a flat bed without using bedrails?: A Little Help needed moving to and from a bed to a chair (including a wheelchair)?: A Little Help needed standing up from a chair using your arms (e.g., wheelchair or bedside chair)?: A Little Help needed to walk in hospital room?: A Little Help needed climbing 3-5 steps with a railing? : A Little 6 Click Score: 18    End of Session Equipment Utilized During Treatment: Gait belt;Left knee immobilizer Activity Tolerance:  Patient tolerated treatment well Patient left: with call bell/phone within reach;in chair;with chair alarm set Nurse Communication: Mobility status PT Visit Diagnosis: Other abnormalities of gait and mobility (R26.89);Difficulty in walking, not elsewhere classified (R26.2)    Time: 3016-0109 PT Time Calculation (min) (ACUTE ONLY): 25 min   Charges:   PT Evaluation $PT Eval Low Complexity: 1 Low PT Treatments $Gait Training: 8-22 mins        Delice Bison, PT  Acute Rehab Dept [facility]) [phone]  [facility] Weekend Pager [facility] only)  [phone]  02/16/2022   [facility] 02/16/2022, 2:53 PM

## 2022-02-16 NOTE — Transfer of Care (Signed)
Immediate Anesthesia Transfer of Care Note  Patient: Monique [name]  Procedure(s) Performed: TOTAL KNEE ARTHROPLASTY (Left: Knee)  Patient Location: PACU  Anesthesia Type:Spinal  Level of Consciousness: awake, alert , oriented and patient cooperative  Airway & Oxygen Therapy: Patient Spontanous Breathing and Patient connected to face mask  Post-op Assessment: Report given to RN and Post -op Vital signs reviewed and stable  Post vital signs: Reviewed and stable  Last Vitals:  Vitals Value Taken Time  BP 144/62 02/16/22 0949  Temp    Pulse 73 02/16/22 0951  Resp 20 02/16/22 0951  SpO2 99 % 02/16/22 0951  Vitals shown include unvalidated device data.  Last Pain:  Vitals:   02/16/22 0626  TempSrc:   PainSc: 5       Patients Stated Pain Goal: 4 (02/16/22 0626)  Complications: No notable events documented.

## 2022-02-16 NOTE — Progress Notes (Signed)
Assisted Dr. Singer with left, adductor canal block. Side rails up, monitors on throughout procedure. See vital signs in flow sheet. Tolerated Procedure well.  

## 2022-02-16 NOTE — Op Note (Signed)
OPERATIVE REPORT-TOTAL KNEE ARTHROPLASTY   Pre-operative diagnosis- Osteoarthritis  Left knee(s)  Post-operative diagnosis- Osteoarthritis Left knee(s)  Procedure-  Left  Total Knee Arthroplasty  Surgeon- Gus Rankin. Shamir Sedlar, MD  Assistant- Leilani Able, PA-C   Anesthesia-   Adductor canal block and spinal  EBL-100 mL   Drains None  Tourniquet time-  Total Tourniquet Time Documented: Thigh (Left) - 37 minutes Total: Thigh (Left) - 37 minutes     Complications- None  Condition-PACU - hemodynamically stable.   Brief Clinical Note  Monique [name] is a 81 y.o. year old female with end stage OA of her left knee with progressively worsening pain and dysfunction. She has constant pain, with activity and at rest and significant functional deficits with difficulties even with ADLs. She has had extensive non-op management including analgesics, injections of cortisone and viscosupplements, and home exercise program, but remains in significant pain with significant dysfunction. Radiographs show bone on bone arthritis medial and patellofemoral. She presents now for left Total Knee Arthroplasty.     Procedure in detail---   The patient is brought into the operating room and positioned supine on the operating table. After successful administration of  Adductor canal block and spinal,   a tourniquet is placed high on the  Left thigh(s) and the lower extremity is prepped and draped in the usual sterile fashion. Time out is performed by the operating team and then the  Left lower extremity is wrapped in Esmarch, knee flexed and the tourniquet inflated to 300 mmHg.       A midline incision is made with a ten blade through the subcutaneous tissue to the level of the extensor mechanism. A fresh blade is used to make a medial parapatellar arthrotomy. Soft tissue over the proximal medial tibia is subperiosteally elevated to the joint line with a knife and into the semimembranosus bursa with a Cobb  elevator. Soft tissue over the proximal lateral tibia is elevated with attention being paid to avoiding the patellar tendon on the tibial tubercle. The patella is everted, knee flexed 90 degrees and the ACL and PCL are removed. Findings are bone on bone medial and patellofemoral with massive global osteophytes        The drill is used to create a starting hole in the distal femur and the canal is thoroughly irrigated with sterile saline to remove the fatty contents. The 5 degree Left  valgus alignment guide is placed into the femoral canal and the distal femoral cutting block is pinned to remove 9 mm off the distal femur. Resection is made with an oscillating saw.      The tibia is subluxed forward and the menisci are removed. The extramedullary alignment guide is placed referencing proximally at the medial aspect of the tibial tubercle and distally along the second metatarsal axis and tibial crest. The block is pinned to remove 33mm off the more deficient medial  side. Resection is made with an oscillating saw. Size 3is the most appropriate size for the tibia and the proximal tibia is prepared with the modular drill and keel punch for that size.      The femoral sizing guide is placed and size 4 is most appropriate. Rotation is marked off the epicondylar axis and confirmed by creating a rectangular flexion gap at 90 degrees. The size 4 cutting block is pinned in this rotation and the anterior, posterior and chamfer cuts are made with the oscillating saw. The intercondylar block is then placed and that cut is  made.      Trial size 3 tibial component, trial size 4 narrow posterior stabilized femur and a 8  mm posterior stabilized rotating platform insert trial is placed. Full extension is achieved with excellent varus/valgus and anterior/posterior balance throughout full range of motion. The patella is everted and thickness measured to be 22  mm. Free hand resection is taken to 12 mm, a 35 template is placed, lug  holes are drilled, trial patella is placed, and it tracks normally. Osteophytes are removed off the posterior femur with the trial in place. All trials are removed and the cut bone surfaces prepared with pulsatile lavage. Cement is mixed and once ready for implantation, the size 3 tibial implant, size  4 narrow posterior stabilized femoral component, and the size 35 patella are cemented in place and the patella is held with the clamp. The trial insert is placed and the knee held in full extension. The Exparel (20 ml mixed with 60 ml saline) is injected into the extensor mechanism, posterior capsule, medial and lateral gutters and subcutaneous tissues.  All extruded cement is removed and once the cement is hard the permanent 8 mm posterior stabilized rotating platform insert is placed into the tibial tray.      The wound is copiously irrigated with saline solution and the extensor mechanism closed with # 0 Stratofix suture. The tourniquet is released for a total tourniquet time of 37  minutes. Flexion against gravity is 140 degrees and the patella tracks normally. Subcutaneous tissue is closed with 2.0 vicryl and subcuticular with running 4.0 Monocryl. The incision is cleaned and dried and steri-strips and a bulky sterile dressing are applied. The limb is placed into a knee immobilizer and the patient is awakened and transported to recovery in stable condition.      Please note that a surgical assistant was a medical necessity for this procedure in order to perform it in a safe and expeditious manner. Surgical assistant was necessary to retract the ligaments and vital neurovascular structures to prevent injury to them and also necessary for proper positioning of the limb to allow for anatomic placement of the prosthesis.   Gus Rankin Monique Leske, MD    02/16/2022, 9:31 AM

## 2022-02-17 ENCOUNTER — Encounter (HOSPITAL_COMMUNITY): Payer: Self-pay | Admitting: Orthopedic Surgery

## 2022-02-17 DIAGNOSIS — J45909 Unspecified asthma, uncomplicated: Secondary | ICD-10-CM | POA: Diagnosis not present

## 2022-02-17 DIAGNOSIS — I10 Essential (primary) hypertension: Secondary | ICD-10-CM | POA: Diagnosis not present

## 2022-02-17 DIAGNOSIS — I2510 Atherosclerotic heart disease of native coronary artery without angina pectoris: Secondary | ICD-10-CM | POA: Diagnosis not present

## 2022-02-17 DIAGNOSIS — M1712 Unilateral primary osteoarthritis, left knee: Secondary | ICD-10-CM | POA: Diagnosis not present

## 2022-02-17 LAB — CBC
HCT: 32.7 % — ABNORMAL LOW (ref 36.0–46.0)
Hemoglobin: 10.9 g/dL — ABNORMAL LOW (ref 12.0–15.0)
MCH: 31.3 pg (ref 26.0–34.0)
MCHC: 33.3 g/dL (ref 30.0–36.0)
MCV: 94 fL (ref 80.0–100.0)
Platelets: 144 10*3/uL — ABNORMAL LOW (ref 150–400)
RBC: 3.48 MIL/uL — ABNORMAL LOW (ref 3.87–5.11)
RDW: 13.6 % (ref 11.5–15.5)
WBC: 11.8 10*3/uL — ABNORMAL HIGH (ref 4.0–10.5)
nRBC: 0 % (ref 0.0–0.2)

## 2022-02-17 LAB — BASIC METABOLIC PANEL
Anion gap: 6 (ref 5–15)
BUN: 20 mg/dL (ref 8–23)
CO2: 25 mmol/L (ref 22–32)
Calcium: 8.9 mg/dL (ref 8.9–10.3)
Chloride: 110 mmol/L (ref 98–111)
Creatinine, Ser: 0.62 mg/dL (ref 0.44–1.00)
GFR, Estimated: >60 mL/min (ref >60)
Glucose, Bld: 185 mg/dL — ABNORMAL HIGH (ref 70–99)
Potassium: 3.8 mmol/L (ref 3.5–5.1)
Sodium: 141 mmol/L (ref 135–145)

## 2022-02-17 MED ORDER — ASPIRIN 325 MG PO TBEC: 325.0000 mg | DELAYED_RELEASE_TABLET | Freq: Every day | ORAL | 0 refills | Status: AC

## 2022-02-17 MED ORDER — TRAMADOL HCL 50 MG PO TABS
50.0000 mg | ORAL_TABLET | Freq: Four times a day (QID) | ORAL | 0 refills | Status: DC | PRN
Start: 2022-02-17 — End: 2023-02-24

## 2022-02-17 MED ORDER — OXYCODONE HCL 5 MG PO TABS: 5.0000 mg | ORAL_TABLET | Freq: Four times a day (QID) | ORAL | 0 refills | Status: DC | PRN

## 2022-02-17 MED ORDER — METHOCARBAMOL 500 MG PO TABS: 500.0000 mg | ORAL_TABLET | Freq: Four times a day (QID) | ORAL | 0 refills | Status: DC | PRN

## 2022-02-17 NOTE — Plan of Care (Signed)

## 2022-02-17 NOTE — Progress Notes (Signed)
Subjective: 1 Day Post-Op Procedure(s) (LRB): TOTAL KNEE ARTHROPLASTY (Left) Patient reports pain as mild.   Patient seen in rounds by Dr. Lequita Halt. Patient is well, and has had no acute complaints or problems. Had sinus tachycardia last night into the 130s. Stat EKG obtained. PMH of CABG x 3 and sinus tach. Improved with pain medications, this AM her pulse is in the 70s. Will continue to monitor. Patient was asymptomatic last night during this. Denies chest pain or SOB this morning.  We will continue therapy today, ambulated 30' yesterday.  Objective: Vital signs in last 24 hours: Temp:  [97.4 F (36.3 C)-98.4 F (36.9 C)] 98.2 F (36.8 C) (07/18 0351) Pulse Rate:  [66-136] 88 (07/18 0351) Resp:  [13-34] 19 (07/18 0351) BP: (144-216)/(62-107) 160/75 (07/18 0351) SpO2:  [95 %-100 %] 99 % (07/18 0351)  Intake/Output from previous day:  Intake/Output Summary (Last 24 hours) at 02/17/2022 0740 Last data filed at 02/17/2022 0526 Gross per 24 hour  Intake 1824.92 ml  Output 2765 ml  Net -940.08 ml     Intake/Output this shift: No intake/output data recorded.  Labs: Recent Labs    02/17/22 0330  HGB 10.9*   Recent Labs    02/17/22 0330  WBC 11.8*  RBC 3.48*  HCT 32.7*  PLT 144*   Recent Labs    02/17/22 0330  NA 141  K 3.8  CL 110  CO2 25  BUN 20  CREATININE 0.62  GLUCOSE 185*  CALCIUM 8.9   No results for input(s): "LABPT", "INR" in the last 72 hours.  Exam: General - Patient is Alert and Oriented Extremity - Neurologically intact Neurovascular intact Sensation intact distally Dorsiflexion/Plantar flexion intact Dressing - dressing C/D/I Motor Function - intact, moving foot and toes well on exam.   Past Medical History:  Diagnosis Date   Anemia     mild as per PCP   Anginal pain (HCC)    06/20/20, relieved    Arthritis    hip/ s/p recent shoulder fracture 8/12- RIGHT   Asthma    Coronary artery disease 05/30/2018   LAD PCI/DES   GI bleeding     Hiatal hernia    History of kidney stones    Last year had surgery   Humerus fracture    with wrist on right side   Hypertension    hypercholesterolemia/  EKG on chart with clearance and note 06/18/11  Mazzocchi   Pneumonia    Pre-diabetes     Assessment/Plan: 1 Day Post-Op Procedure(s) (LRB): TOTAL KNEE ARTHROPLASTY (Left) Principal Problem:   OA (osteoarthritis) of knee Active Problems:   Osteoarthritis of left knee  Estimated body mass index is 30.63 kg/m as calculated from the following:   Height as of this encounter: 5\' 5"  (1.651 m).   Weight as of this encounter: 83.5 kg. Advance diet Up with therapy D/C IV fluids   Patient's anticipated LOS is less than 2 midnights, meeting these requirements: - Lives within 1 hour of care - Has a competent adult at home to recover with post-op recover - NO history of  - Chronic pain requiring opioids  - Diabetes  - Heart failure  - Stroke  - DVT/VTE  - Cardiac arrhythmia  - Respiratory Failure/COPD  - Renal failure  - Anemia  - Advanced Liver disease  DVT Prophylaxis - Aspirin and Plavix Weight bearing as tolerated. Continue therapy. Plan is to go Home after hospital stay.  Plan for discharge later today if no recurrence  of sinus tachycardia. If this does recur, we will consult the hospitalists for further management.  Otherwise, discharge to home with OPPT at [facility] in [place]. Will follow-up with [place] in 2 weeks.  The PDMP database was reviewed today prior to any opioid medications being prescribed to this patient.  Arther Abbott, PA-C Orthopedic Surgery [phone] 02/17/2022, 7:40 AM

## 2022-02-17 NOTE — Progress Notes (Addendum)
Physical Therapy Treatment Patient Details Name: Monique [name] MRN: [id number] DOB: 11/02/1940 Today's Date: 02/17/2022   History of Present Illness 81 yo female s/p L TKA on 02/16/22. PMH: CAD, CABG x3, HTN, R wrist fx, CAD, colostomy with reversal, R THA 2012    PT Comments    Pt progressing well. Per pt MD would like her to have 2 sessions of PT prior to d/c today. Will see again and pt should be ready for d/c later today; HR max 93 with mobility    Recommendations for follow up therapy are one component of a multi-disciplinary discharge planning process, led by the attending physician.  Recommendations may be updated based on patient status, additional functional criteria and insurance authorization.  Follow Up Recommendations  Follow physician's recommendations for discharge plan and follow up therapies     Assistance Recommended at Discharge Intermittent Supervision/Assistance  Patient can return home with the following A little help with walking and/or transfers;Help with stairs or ramp for entrance;Assist for transportation;Assistance with cooking/housework   Equipment Recommendations  None recommended by PT    Recommendations for Other Services       Precautions / Restrictions Precautions Precautions: Fall;Knee Required Braces or Orthoses: Knee Immobilizer - Left Knee Immobilizer - Left: Discontinue once straight leg raise with < 10 degree lag Restrictions Weight Bearing Restrictions: No Other Position/Activity Restrictions: WBAT     Mobility  Bed Mobility Overal bed mobility: Needs Assistance Bed Mobility: Supine to Sit     Supine to sit: Min guard, Supervision     General bed mobility comments: able to self assist with gait belt    Transfers Overall transfer level: Needs assistance Equipment used: Rolling walker (2 wheels) Transfers: Sit to/from Stand Sit to Stand: Min assist           General transfer comment: verbal cues for hand placement and  LLE position    Ambulation/Gait Ambulation/Gait assistance: Min guard Gait Distance (Feet): 80 Feet Assistive device: Rolling walker (2 wheels) Gait Pattern/deviations: Step-to pattern, Decreased stance time - left       General Gait Details: cues for sequence and RW position from self, safety wtih turns   Social research officer, government Rankin (Stroke Patients Only)       Balance                                            Cognition Arousal/Alertness: Awake/alert Behavior During Therapy: WFL for tasks assessed/performed Overall Cognitive Status: Within Functional Limits for tasks assessed                                          Exercises Total Joint Exercises Ankle Circles/Pumps: AROM, Both, 10 reps    General Comments        Pertinent Vitals/Pain Pain Assessment Pain Assessment: 0-10 Pain Score: 4  Pain Location: L knee Pain Descriptors / Indicators: Aching, Sore, Operative site guarding Pain Intervention(s): Limited activity within patient's tolerance, Monitored during session, Premedicated before session, Repositioned, Ice applied    Home Living                          Prior  Function            PT Goals (current goals can now be found in the care plan section) Acute Rehab PT Goals Patient Stated Goal: less pain, back to being independent PT Goal Formulation: With patient Time For Goal Achievement: 02/23/22 Progress towards PT goals: Progressing toward goals    Frequency    7X/week      PT Plan Current plan remains appropriate    Co-evaluation              AM-PAC PT "6 Clicks" Mobility   Outcome Measure  Help needed turning from your back to your side while in a flat bed without using bedrails?: A Little Help needed moving from lying on your back to sitting on the side of a flat bed without using bedrails?: A Little Help needed moving to and from a bed to a  chair (including a wheelchair)?: A Little Help needed standing up from a chair using your arms (e.g., wheelchair or bedside chair)?: A Little Help needed to walk in hospital room?: A Little Help needed climbing 3-5 steps with a railing? : A Little 6 Click Score: 18    End of Session Equipment Utilized During Treatment: Gait belt;Left knee immobilizer Activity Tolerance: Patient tolerated treatment well Patient left: with call bell/phone within reach;with chair alarm set;in chair Nurse Communication: Mobility status PT Visit Diagnosis: Other abnormalities of gait and mobility (R26.89);Difficulty in walking, not elsewhere classified (R26.2)     Time: 1751-0258 PT Time Calculation (min) (ACUTE ONLY): 22 min  Charges:  $Gait Training: 8-22 mins                     Delice Bison, PT  Acute Rehab Dept [facility]) [phone]  [facility] Weekend Pager [facility] only)  [phone]  02/17/2022    [facility] 02/17/2022, 11:18 AM

## 2022-02-17 NOTE — TOC Transition Note (Signed)
Transition of Care [facility]) - CM/SW Discharge Note   Patient Details  Name: Monique [name] MRN: [id number] Date of Birth: 14-Oct-1940  [facility] Care [facility]) CM/SW Contact:  Lennart Pall, LCSW Phone Number: 02/17/2022, 9:31 AM   Clinical Narrative:     Met with pt and confirming she has all needed DME at home. OPPT already set up with [facility] No TOC needs.  Final next level of care: OP Rehab Barriers to Discharge: No Barriers Identified   Patient Goals and CMS Choice Patient states their goals for this hospitalization and ongoing recovery are:: return home      Discharge Placement                       Discharge Plan and Services                DME Arranged: N/A DME Agency: NA                  Social Determinants of Health (SDOH) Interventions     Readmission Risk Interventions     No data to display

## 2022-02-17 NOTE — Progress Notes (Signed)
   02/16/22 2324  Assess: MEWS Score  Temp 98.4 F (36.9 C)  BP (!) 145/82  MAP (mmHg) 102  Pulse Rate (!) 136  Resp 19  SpO2 98 %  Assess: MEWS Score  MEWS Temp 0  MEWS Systolic 0  MEWS Pulse 3  MEWS RR 0  MEWS LOC 0  MEWS Score 3  MEWS Score Color Yellow  Assess: if the MEWS score is Yellow or Red  Were vital signs taken at a resting state? Yes  Focused Assessment No change from prior assessment  Does the patient meet 2 or more of the SIRS criteria? Yes  Does the patient have a confirmed or suspected source of infection? No  MEWS guidelines implemented *See Row Information* Yes  Treat  MEWS Interventions Administered prn meds/treatments  Pain Scale 0-10  Pain Score 5  Pain Type Surgical pain  Pain Location Knee  Pain Orientation Left  Pain Descriptors / Indicators Aching;Constant  Pain Frequency Intermittent  Pain Onset On-going  Patients Stated Pain Goal 2  Pain Intervention(s) Medication (See eMAR)  Take Vital Signs  Increase Vital Sign Frequency  Yellow: Q 2hr X 2 then Q 4hr X 2, if remains yellow, continue Q 4hrs  Escalate  MEWS: Escalate Yellow: discuss with charge nurse/RN and consider discussing with provider and RRT  Notify: Charge Nurse/RN  Name of Charge Nurse/RN Notified Onalee Hua  Date Charge Nurse/RN Notified 02/17/22  Time Charge Nurse/RN Notified 0025  Notify: Provider  Provider Name/Title Netta Cedars, MD  Date Provider Notified 02/16/22  Time Provider Notified 2341  Method of Notification Call  Notification Reason Other (Comment) (yellow mews HR 140s)  Provider response See new orders  Date of Provider Response 02/16/22  Time of Provider Response 2354  Notify: Rapid Response  Date Rapid Response Notified 02/17/22  Time Rapid Response Notified 0020  Document  Patient Outcome Stabilized after interventions;Other (Comment) (tele monitoring)  Progress note created (see row info) Yes  Assess: SIRS CRITERIA  SIRS Temperature  0  SIRS Pulse  1  SIRS Respirations  0  SIRS WBC 1  SIRS Score Sum  2

## 2022-02-17 NOTE — Care Plan (Signed)
Orthopaedic Surgery Plan of Care  -paged re sustained elevated HR in high 130s -pain medication given as per ordered regimen -patient asymptomatic, other vitals stable -she has a history of CAD s/p CABG, prior sinus tachycardia -ECG stat ordered, continuous telemetry monitoring overnight -nursing will reach out to Rapid Response to alert them of her tachycardia and for closer monitoring overnight  Netta Cedars, MD Orthopaedic Surgery [facility]

## 2022-02-17 NOTE — Progress Notes (Addendum)
02/17/22 1400  PT Visit Information  Last PT Received On 02/17/22  Assistance Needed +  Pt is progressing well this session. Ready for d/c with family assist as needed from PT standpoint. HR max 87 during amb and therapeutic exercise   History of Present Illness 81 yo female s/p L TKA on 02/16/22. PMH: CAD, CABG x3, HTN, R wrist fx, CAD, colostomy with reversal, R THA 2012  Subjective Data  Patient Stated Goal less pain, back to being independent  Precautions  Precautions Fall;Knee  Required Braces or Orthoses Knee Immobilizer - Left  Knee Immobilizer - Left Discontinue once straight leg raise with < 10 degree lag  Restrictions  Weight Bearing Restrictions No  Other Position/Activity Restrictions WBAT  Pain Assessment  Pain Assessment 0-10  Pain Score 4  Pain Location L knee  Pain Descriptors / Indicators Aching;Sore;Operative site guarding  Pain Intervention(s) Limited activity within patient's tolerance;Monitored during session;Premedicated before session;Repositioned;Ice applied  Cognition  Arousal/Alertness Awake/alert  Behavior During Therapy WFL for tasks assessed/performed  Overall Cognitive Status Within Functional Limits for tasks assessed  Bed Mobility  General bed mobility comments in recliner  Transfers  Overall transfer level Needs assistance  Equipment used Rolling walker (2 wheels)  Transfers Sit to/from Stand  Sit to Stand Supervision;Min guard  General transfer comment verbal cues for hand placement and LLE position (from reg ht toilet and recliner)  Ambulation/Gait  Ambulation/Gait assistance Supervision  Gait Distance (Feet) 120 Feet (10' more)  Assistive device Rolling walker (2 wheels)  Gait Pattern/deviations Step-to pattern;Decreased stance time - left  General Gait Details cues for sequence and RW position from self. progression beginning  to step through  Total Joint Exercises  Ankle Circles/Pumps AROM;Both;10 reps  Quad Sets AROM;Both;10 reps   Heel Slides AAROM;10 reps;Left  Straight Leg Raises AAROM;AROM;Left;10 reps  PT - End of Session  Equipment Utilized During Treatment Gait belt;Left knee immobilizer  Activity Tolerance Patient tolerated treatment well  Patient left with call bell/phone within reach;with chair alarm set;in chair  Nurse Communication Mobility status   PT - Assessment/Plan  PT Plan Current plan remains appropriate  PT Visit Diagnosis Other abnormalities of gait and mobility (R26.89);Difficulty in walking, not elsewhere classified (R26.2)  PT Frequency (ACUTE ONLY) 7X/week  Follow Up Recommendations Follow physician's recommendations for discharge plan and follow up therapies  Assistance recommended at discharge Intermittent Supervision/Assistance  Patient can return home with the following A little help with walking and/or transfers;Help with stairs or ramp for entrance;Assist for transportation;Assistance with cooking/housework  PT equipment None recommended by PT  AM-PAC PT "6 Clicks" Mobility Outcome Measure (Version 2)  Help needed turning from your back to your side while in a flat bed without using bedrails? 3  Help needed moving from lying on your back to sitting on the side of a flat bed without using bedrails? 3  Help needed moving to and from a bed to a chair (including a wheelchair)? 3  Help needed standing up from a chair using your arms (e.g., wheelchair or bedside chair)? 3  Help needed to walk in hospital room? 3  Help needed climbing 3-5 steps with a railing?  3  6 Click Score 18  Consider Recommendation of Discharge To: Home with [facility]  Progressive Mobility  What is the highest level of mobility based on the progressive mobility assessment? Level 5 (Walks with assist in room/hall) - Balance while stepping forward/back and can walk in room with assist - Complete  Activity Ambulated  with assistance in hallway  PT Goal Progression  Progress towards PT goals Progressing toward goals  Acute  Rehab PT Goals  PT Goal Formulation With patient  Time For Goal Achievement 02/23/22  PT Time Calculation  PT Start Time (ACUTE ONLY) 1417  PT Stop Time (ACUTE ONLY) 1456  PT Time Calculation (min) (ACUTE ONLY) 39 min  PT General Charges  $$ ACUTE PT VISIT 1 Visit  PT Treatments  $Gait Training 8-22 mins  $Therapeutic Exercise 8-22 mins  $Therapeutic Activity 8-22 mins

## 2022-02-17 NOTE — Progress Notes (Signed)
Discharge instructions and medications reviewed with patient. All questions and concerns addressed. BP rechecked, 160/74. Denies any current concerns. All belongings given.

## 2022-02-18 ENCOUNTER — Encounter (HOSPITAL_COMMUNITY): Payer: Self-pay | Admitting: Orthopedic Surgery

## 2022-02-18 MED ORDER — DEXAMETHASONE SODIUM PHOSPHATE 10 MG/ML IJ SOLN
INTRAMUSCULAR | Status: DC | PRN
  Administered 2022-02-16: 5 mg

## 2022-02-18 MED ORDER — ROPIVACAINE HCL 7.5 MG/ML IJ SOLN
INTRAMUSCULAR | Status: DC | PRN
  Administered 2022-02-16: 20 mL via PERINEURAL

## 2022-02-18 MED ORDER — BUPIVACAINE IN DEXTROSE 0.75-8.25 % IT SOLN
INTRATHECAL | Status: DC | PRN
  Administered 2022-02-16: 1.8 mL via INTRATHECAL

## 2022-02-18 NOTE — Anesthesia Procedure Notes (Signed)
Anesthesia Regional Block: Adductor canal block   Pre-Anesthetic Checklist: , timeout performed,  Correct Patient, Correct Site, Correct Laterality,  Correct Procedure, Correct Position, site marked,  Risks and benefits discussed,  Surgical consent,  Pre-op evaluation,  At surgeon's request and post-op pain management  Laterality: Left  Prep: chloraprep       Needles:  Injection technique: Single-shot  Needle Type: Stimulator Needle - 80     Needle Length: 10cm  Needle Gauge: 21     Additional Needles:   Narrative:  Start time: 02/16/2022 7:43 AM End time: 02/16/2022 7:53 AM Injection made incrementally with aspirations every 5 mL.  Performed by: Personally  Anesthesiologist: Heather Roberts, MD

## 2022-02-18 NOTE — Discharge Summary (Signed)
Patient ID: Monique [name] MRN: [id number] DOB/AGE: Jul 30, 1941 81 y.o.  Admit date: 02/16/2022 Discharge date: 02/17/2022  Admission Diagnoses:  Principal Problem:   OA (osteoarthritis) of knee Active Problems:   Osteoarthritis of left knee   Discharge Diagnoses:  Same  Past Medical History:  Diagnosis Date   Anemia     mild as per PCP   Anginal pain ([place])    06/20/20, relieved    Arthritis    hip/ s/p recent shoulder fracture 8/12- RIGHT   Asthma    Coronary artery disease 05/30/2018   LAD PCI/DES   GI bleeding    Hiatal hernia    History of kidney stones    Last year had surgery   Humerus fracture    with wrist on right side   Hypertension    hypercholesterolemia/  EKG on chart with clearance and note 06/18/11  [facility]   Pneumonia    Pre-diabetes     Surgeries: Procedure(s): TOTAL KNEE ARTHROPLASTY on 02/16/2022   Consultants:   Discharged Condition: Improved  Hospital Course: Monique [name] is an 81 y.o. female who was admitted 02/16/2022 for operative treatment ofOA (osteoarthritis) of knee. Patient has severe unremitting pain that affects sleep, daily activities, and work/hobbies. After pre-op clearance the patient was taken to the operating room on 02/16/2022 and underwent  Procedure(s): TOTAL KNEE ARTHROPLASTY.    Patient was given perioperative antibiotics:  Anti-infectives (From admission, onward)    Start     Dose/Rate Route Frequency Ordered Stop   02/16/22 1400  ceFAZolin (ANCEF) IVPB 2g/100 mL premix        2 g 200 mL/hr over 30 Minutes Intravenous Every 6 hours 02/16/22 1211 02/16/22 2003   02/16/22 0615  ceFAZolin (ANCEF) IVPB 2g/100 mL premix        2 g 200 mL/hr over 30 Minutes Intravenous On call to O.R. 02/16/22 3846 02/16/22 0820        Patient was given sequential compression devices, early ambulation, and chemoprophylaxis to prevent DVT.  Patient benefited maximally from hospital stay and there were no complications.    Recent  vital signs: Patient Vitals for the past 24 hrs:  BP Temp Temp src Pulse Resp SpO2  02/17/22 1600 (!) 162/78 -- -- 75 -- --  02/17/22 1319 (!) 173/60 98 F (36.7 C) Oral 76 20 98 %     Recent laboratory studies:  Recent Labs    02/17/22 0330  WBC 11.8*  HGB 10.9*  HCT 32.7*  PLT 144*  NA 141  K 3.8  CL 110  CO2 25  BUN 20  CREATININE 0.62  GLUCOSE 185*  CALCIUM 8.9     Discharge Medications:   Allergies as of 02/17/2022       Reactions   Morphine Other (See Comments)   Medication did not work for pain    Penicillins Other (See Comments)   UNSPECIFIED REACTION  Unknown reaction as a child.  Tolerated Zosyn 2017. Tolerated Cephalosporin Date: 02/17/22.   Pregabalin Anxiety   Lyrica         Medication List     STOP taking these medications    Advil Dual Action 125-250 MG Tabs Generic drug: Ibuprofen-Acetaminophen       TAKE these medications    aspirin EC 325 MG tablet Take 1 tablet (325 mg total) by mouth daily for 20 days. Then take one 81 mg aspirin once a day for three weeks. Then discontinue aspirin.   atorvastatin 80 MG tablet  Commonly known as: LIPITOR TAKE 1 TABLET(80 MG) BY MOUTH DAILY   Biotrue Soln Place 1 drop into both eyes as needed (dry eyes).   budesonide-formoterol 80-4.5 MCG/ACT inhaler Commonly known as: SYMBICORT Inhale 2 puffs into the lungs in the morning and at bedtime. Also uses as needed for emergency purposes   carvedilol 12.5 MG tablet Commonly known as: COREG TAKE 1 TABLET BY MOUTH 2 TIMES DAILY WITH A MEAL   cetirizine 10 MG tablet Commonly known as: ZYRTEC Take 10 mg by mouth at bedtime.   clopidogrel 75 MG tablet Commonly known as: PLAVIX TAKE 1 TABLET BY MOUTH EVERY DAY   gabapentin 100 MG capsule Commonly known as: NEURONTIN Take 100 mg by mouth in the morning, at noon, and at bedtime.   ipratropium 0.03 % nasal spray Commonly known as: ATROVENT Place 2 sprays into both nostrils 3 (three) times daily  as needed for rhinitis.   lisinopril 40 MG tablet Commonly known as: ZESTRIL Take 40 mg by mouth daily.   methocarbamol 500 MG tablet Commonly known as: ROBAXIN Take 1 tablet (500 mg total) by mouth every 6 (six) hours as needed for muscle spasms.   MULTIVITAMIN WOMEN 50+ PO Take 1 tablet by mouth daily.   naloxone 4 MG/0.1ML Liqd nasal spray kit Commonly known as: NARCAN Place 0.4 mg into the nose once.   nitroGLYCERIN 0.4 MG SL tablet Commonly known as: NITROSTAT Place 1 tablet (0.4 mg total) under the tongue as needed.   OVER THE COUNTER MEDICATION Apply 1 Application topically as needed (pain). Magne Sport Balm   oxyCODONE 5 MG immediate release tablet Commonly known as: Oxy IR/ROXICODONE Take 1-2 tablets (5-10 mg total) by mouth every 6 (six) hours as needed for severe pain.   pantoprazole 40 MG tablet Commonly known as: PROTONIX Take 1 tablet (40 mg total) by mouth 2 (two) times daily before a meal.   Pepcid AC 10 MG tablet Generic drug: famotidine Take 10 mg by mouth daily as needed for heartburn or indigestion.   Phazyme Ultimate 500 MG Caps Generic drug: Simethicone Take 500 mg by mouth daily as needed (bloating).   sucralfate 1 g tablet Commonly known as: Carafate Take your Protonix at least 30 minutes prior to your first morning dose of Carafate.   traMADol 50 MG tablet Commonly known as: ULTRAM Take 1-2 tablets (50-100 mg total) by mouth every 6 (six) hours as needed for moderate pain.               Discharge Care Instructions  (From admission, onward)           Start     Ordered   02/17/22 0000  Weight bearing as tolerated        02/17/22 0746   02/17/22 0000  Change dressing       Comments: You may remove the bulky bandage (ACE wrap and gauze) two days after surgery. You will have an adhesive waterproof bandage underneath. Leave this in place until your first follow-up appointment.   02/17/22 0746            Diagnostic Studies:  CT ABDOMEN PELVIS WO CONTRAST  Result Date: 02/12/2022 CLINICAL DATA:  Abdominal pain, nausea/vomiting EXAM: CT ABDOMEN AND PELVIS WITHOUT CONTRAST TECHNIQUE: Multidetector CT imaging of the abdomen and pelvis was performed following the standard protocol without IV contrast. RADIATION DOSE REDUCTION: This exam was performed according to the departmental dose-optimization program which includes automated exposure control, adjustment of the mA and/or  kV according to patient size and/or use of iterative reconstruction technique. COMPARISON:  01/31/2021 FINDINGS: Lower chest: Lung bases are clear. Hepatobiliary: Unenhanced liver is unremarkable. Layering gallstones with mild gallbladder wall thickening (series 2/image 32), but no pericholecystic inflammatory changes to suggest acute cholecystitis. No intrahepatic or extrahepatic ductal dilatation. Pancreas: Fatty parenchymal atrophy. Spleen: Within normal limits. Adrenals/Urinary Tract: Adrenal glands are within normal limits. Kidneys are within normal limits. No renal, ureteral, or bladder calculi. No hydronephrosis. Bladder is underdistended and partially obscured by streak artifact. Stomach/Bowel: Stomach is within normal limits. No evidence of bowel obstruction. Appendix is not discretely visualized. Status post left hemicolectomy with suture line in the left lower pelvis (series 2/image 87). No colonic wall thickening or inflammatory changes. Vascular/Lymphatic: No evidence of abdominal aortic aneurysm. Atherosclerotic calcifications of the abdominal aorta and branch vessels. No suspicious abdominopelvic lymphadenopathy. Reproductive: Status post hysterectomy. No adnexal masses. Other: No abdominopelvic ascites. Musculoskeletal: Mild degenerative changes of the lower lumbar spine. Right hip arthroplasty, without evidence of complication. IMPRESSION: Cholelithiasis with mild gallbladder wall thickening. No convincing findings to suggest acute cholecystitis.  Status post left hemicolectomy. No evidence of bowel obstruction. Appendix is not discretely visualized. Electronically Signed   By: Julian Hy M.D.   On: 02/12/2022 03:22    Disposition: Discharge disposition: 01-Home or Self Care       Discharge Instructions     Call MD / Call 911   Complete by: As directed    If you experience chest pain or shortness of breath, CALL 911 and be transported to the hospital emergency room.  If you develope a fever above 101 F, pus (white drainage) or increased drainage or redness at the wound, or calf pain, call your surgeon's office.   Change dressing   Complete by: As directed    You may remove the bulky bandage (ACE wrap and gauze) two days after surgery. You will have an adhesive waterproof bandage underneath. Leave this in place until your first follow-up appointment.   Constipation Prevention   Complete by: As directed    Drink plenty of fluids.  Prune juice may be helpful.  You may use a stool softener, such as Colace (over the counter) 100 mg twice a day.  Use MiraLax (over the counter) for constipation as needed.   Diet - low sodium heart healthy   Complete by: As directed    Do not put a pillow under the knee. Place it under the heel.   Complete by: As directed    Driving restrictions   Complete by: As directed    No driving for two weeks   Post-operative opioid taper instructions:   Complete by: As directed    POST-OPERATIVE OPIOID TAPER INSTRUCTIONS: It is important to wean off of your opioid medication as soon as possible. If you do not need pain medication after your surgery it is ok to stop day one. Opioids include: Codeine, Hydrocodone(Norco, Vicodin), Oxycodone(Percocet, oxycontin) and hydromorphone amongst others.  Long term and even short term use of opiods can cause: Increased pain response Dependence Constipation Depression Respiratory depression And more.  Withdrawal symptoms can include Flu like symptoms Nausea,  vomiting And more Techniques to manage these symptoms Hydrate well Eat regular healthy meals Stay active Use relaxation techniques(deep breathing, meditating, yoga) Do Not substitute Alcohol to help with tapering If you have been on opioids for less than two weeks and do not have pain than it is ok to stop all together.  Plan to wean  off of opioids This plan should start within one week post op of your joint replacement. Maintain the same interval or time between taking each dose and first decrease the dose.  Cut the total daily intake of opioids by one tablet each day Next start to increase the time between doses. The last dose that should be eliminated is the evening dose.      TED hose   Complete by: As directed    Use stockings (TED hose) for three weeks on both leg(s).  You may remove them at night for sleeping.   Weight bearing as tolerated   Complete by: As directed         Follow-up Information     Aluisio, Pilar Plate, MD. Schedule an appointment as soon as possible for a visit in 2 week(s).   Specialty: Orthopedic Surgery Contact information: [address] [place] [address] [phone]                  Signed: Theresa Duty 02/18/2022, 9:20 AM

## 2022-02-18 NOTE — Addendum Note (Signed)
Addendum  created 02/18/22 1045 by Heather Roberts, MD   Child order released for a procedure order, Clinical Note Signed, Intraprocedure Blocks edited, Intraprocedure Meds edited, Order Canceled from Note, SmartForm saved

## 2022-02-18 NOTE — Anesthesia Procedure Notes (Signed)
Spinal  Patient location during procedure: OR Start time: 02/18/2022 7:58 AM End time: 02/18/2022 8:08 AM Reason for block: surgical anesthesia Staffing Performed: anesthesiologist  Anesthesiologist: Heather Roberts, MD Performed by: Heather Roberts, MD Authorized by: Heather Roberts, MD   Preanesthetic Checklist Completed: patient identified, IV checked, risks and benefits discussed, surgical consent, monitors and equipment checked, pre-op evaluation and timeout performed Spinal Block Patient position: sitting Prep: DuraPrep Patient monitoring: cardiac monitor, continuous pulse ox and blood pressure Approach: midline Location: L2-3 Injection technique: single-shot Needle Needle type: Pencan  Needle gauge: 24 G Needle length: 9 cm Assessment Events: CSF return Additional Notes Functioning IV was confirmed and monitors were applied. Sterile prep and drape, including hand hygiene and sterile gloves were used. The patient was positioned and the spine was prepped. The skin was anesthetized with lidocaine.  Free flow of clear CSF was obtained prior to injecting local anesthetic into the CSF.  The spinal needle aspirated freely following injection.  The needle was carefully withdrawn.  The patient tolerated the procedure well.

## 2022-03-04 ENCOUNTER — Other Ambulatory Visit: Payer: Self-pay | Admitting: Cardiovascular Disease

## 2022-12-21 ENCOUNTER — Other Ambulatory Visit: Payer: Self-pay

## 2022-12-21 MED ORDER — CARVEDILOL 12.5 MG PO TABS: 12.5000 mg | ORAL_TABLET | Freq: Two times a day (BID) | ORAL | 0 refills | Status: DC

## 2023-01-07 ENCOUNTER — Non-Acute Institutional Stay: Payer: Medicare Other | Admitting: Nurse Practitioner

## 2023-01-07 ENCOUNTER — Encounter: Payer: Self-pay | Admitting: Nurse Practitioner

## 2023-01-07 VITALS — BP 132/80 | HR 18 | Temp 97.3°F | Resp 18 | Ht 65.5 in | Wt 182.0 lb

## 2023-01-07 DIAGNOSIS — M159 Polyosteoarthritis, unspecified: Secondary | ICD-10-CM

## 2023-01-07 DIAGNOSIS — N200 Calculus of kidney: Secondary | ICD-10-CM

## 2023-01-07 DIAGNOSIS — M150 Primary generalized (osteo)arthritis: Secondary | ICD-10-CM

## 2023-01-07 DIAGNOSIS — I21B Myocardial infarction with coronary microvascular dysfunction: Secondary | ICD-10-CM

## 2023-01-07 DIAGNOSIS — I10 Essential (primary) hypertension: Secondary | ICD-10-CM

## 2023-01-07 DIAGNOSIS — E785 Hyperlipidemia, unspecified: Secondary | ICD-10-CM

## 2023-01-07 DIAGNOSIS — N132 Hydronephrosis with renal and ureteral calculous obstruction: Secondary | ICD-10-CM

## 2023-01-07 DIAGNOSIS — I2510 Atherosclerotic heart disease of native coronary artery without angina pectoris: Secondary | ICD-10-CM

## 2023-01-07 DIAGNOSIS — E663 Overweight: Secondary | ICD-10-CM

## 2023-01-07 DIAGNOSIS — Z9861 Coronary angioplasty status: Secondary | ICD-10-CM

## 2023-01-07 DIAGNOSIS — K219 Gastro-esophageal reflux disease without esophagitis: Secondary | ICD-10-CM

## 2023-01-07 NOTE — Assessment & Plan Note (Signed)
taking Atorvastatin.  ?

## 2023-01-07 NOTE — Progress Notes (Addendum)
Location:   Clinic [facility]   Place of Service:  Clinic (12) Provider: Chipper Oman NP  Code Status: DNR Goals of Care:     01/07/2023   12:50 PM  Advanced Directives  Does Patient Have a Medical Advance Directive? Yes  Type of Estate agent of [place];Living will  Does patient want to make changes to medical advance directive? No - Patient declined  Copy of Healthcare Power of Attorney in Chart? Yes - validated most recent copy scanned in chart (See row information)     Chief Complaint  Patient presents with   Establish Care    New patient here to establish care    HPI: Patient is a 82 y.o. female seen today for medical management of chronic diseases.     HTN, taking Carvedilol, Lisinopril. Bun/creat 20/0.62 02/17/22  HLD, taking Atorvastatin  Neuropathy, on Gabapentin  Hx of UTI, s/p kidney stones surgical removal.   OA, s/p L knee replacement, R hip, L shoulder pain-limited ROM, s/p Ortho, no further surgery, on Tramadol.   Insomnia, hx of   CAD s/p CABG x3 11/21/18, (prior stent drug eluting), hx of tachycardia, on oral nitrate, Plavix, Carvedilol, Lisinopril  GERD, on Famotidine, Pantoprazole, Sucralfate, Hgb 10.9 02/17/22  COPD Symbicort, Atrovent nasal spray   Past Medical History:  Diagnosis Date   Anemia     mild as per PCP   Anginal pain (HCC)    06/20/20, relieved    Arthritis    hip/ s/p recent shoulder fracture 8/12- RIGHT   Asthma    Coronary artery disease 05/30/2018   LAD PCI/DES   GI bleeding    Hiatal hernia    History of kidney stones    Last year had surgery   Humerus fracture    with wrist on right side   Hypertension    hypercholesterolemia/  EKG on chart with clearance and note 06/18/11  Mazzocchi   Pneumonia    Pre-diabetes     Past Surgical History:  Procedure Laterality Date   ABDOMINAL HYSTERECTOMY     COLECTOMY WITH COLOSTOMY CREATION/HARTMANN PROCEDURE N/A 05/07/2016   Procedure: COLOSTOMY CREATION/HARTMANN  PROCEDURE;  Surgeon: Romie Levee, MD;  Location: [facility];  Service: General;  Laterality: N/A;   COLONOSCOPY N/A 08/26/2016   Procedure: COLONOSCOPY;  Surgeon: Romie Levee, MD;  Location: [facility];  Service: Endoscopy;  Laterality: N/A;   COLOSTOMY TAKEDOWN N/A 08/27/2016   Procedure: LAPAROSCOPIC COLOSTOMY REVERSAL;  Surgeon: Romie Levee, MD;  Location: [facility];  Service: General;  Laterality: N/A;   CORONARY ARTERY BYPASS GRAFT N/A 11/21/2018   Procedure: CORONARY ARTERY BYPASS GRAFTING (CABG) x Three , using left internal mammary artery and right leg greater saphenous vein harvested endoscopically;  Surgeon: Alleen Borne, MD;  Location: [facility];  Service: Open Heart Surgery;  Laterality: N/A;   CORONARY STENT INTERVENTION N/A 05/30/2018   Procedure: CORONARY STENT INTERVENTION;  Surgeon: Runell Gess, MD;  Location: [facility];  Service: Cardiovascular;  Laterality: N/A;   CYSTOSCOPY WITH RETROGRADE PYELOGRAM, URETEROSCOPY AND STENT PLACEMENT Bilateral 06/21/2020   Procedure: CYSTOSCOPY WITH RETROGRADE PYELOGRAM, AND BILATERAL STENT PLACEMENT;  Surgeon: Sebastian Ache, MD;  Location: [facility];  Service: Urology;  Laterality: Bilateral;   CYSTOSCOPY WITH RETROGRADE PYELOGRAM, URETEROSCOPY AND STENT PLACEMENT Bilateral 07/12/2020   Procedure: CYSTOSCOPY WITH RETROGRADE PYELOGRAM, URETEROSCOPY AND STENT PLACEMENT;  Surgeon: Crist Fat, MD;  Location: [facility];  Service: Urology;  Laterality: Bilateral;  2 HRS  EYE SURGERY     eyelid tuck bilateral   HOLMIUM LASER APPLICATION Bilateral 07/12/2020   Procedure: HOLMIUM LASER APPLICATION;  Surgeon: Crist Fat, MD;  Location: [facility];  Service: Urology;  Laterality: Bilateral;   LAPAROTOMY N/A 05/07/2016   Procedure: EXPLORATORY LAPAROTOMY, LYLSIS OF ADHSIONS, Pam Drown OF PERITONEAL ABSCESS;  Surgeon: Romie Levee, MD;  Location: [facility];  Service: General;  Laterality: N/A;   LEFT HEART CATH AND CORONARY ANGIOGRAPHY  N/A 05/30/2018   Procedure: LEFT HEART CATH AND CORONARY ANGIOGRAPHY;  Surgeon: Runell Gess, MD;  [facility]: [facility];  Service: Cardiovascular;  Laterality: N/A;   LEFT HEART CATH AND CORONARY ANGIOGRAPHY N/A 11/17/2018   Procedure: LEFT HEART CATH AND CORONARY ANGIOGRAPHY;  Surgeon: Runell Gess, MD;  Location: [facility];  Service: Cardiovascular;  Laterality: N/A;   PARATHYROIDECTOMY     one lobe- benign per pt   TEE WITHOUT CARDIOVERSION N/A 11/21/2018   Procedure: TRANSESOPHAGEAL ECHOCARDIOGRAM (TEE);  Surgeon: Alleen Borne, MD;  Location: [facility];  Service: Open Heart Surgery;  Laterality: N/A;   TONSILLECTOMY     TOTAL HIP ARTHROPLASTY  07/08/2011   Procedure: TOTAL HIP ARTHROPLASTY;  Surgeon: Gus Rankin Aluisio;  Location: [facility];  Service: Orthopedics;  Laterality: Right;   TOTAL KNEE ARTHROPLASTY Left 02/16/2022   Procedure: TOTAL KNEE ARTHROPLASTY;  Surgeon: Ollen Gross, MD;  Location: [facility];  Service: Orthopedics;  Laterality: Left;    Allergies  Allergen Reactions   Morphine Other (See Comments)    Medication did not work for pain    Penicillins Other (See Comments)    UNSPECIFIED REACTION  Unknown reaction as a child.  Tolerated Zosyn 2017. Tolerated Cephalosporin Date: 02/17/22.     Pregabalin Anxiety    Lyrica    Pregabalin Swelling and Anxiety    Lyrica   anxiety    Allergies as of 01/07/2023       Reactions   Morphine Other (See Comments)   Medication did not work for pain    Penicillins Other (See Comments)   UNSPECIFIED REACTION  Unknown reaction as a child.  Tolerated Zosyn 2017. Tolerated Cephalosporin Date: 02/17/22.   Pregabalin Anxiety   Lyrica    Pregabalin Swelling, Anxiety   Lyrica  anxiety        Medication List        Accurate as of January 07, 2023 11:59 PM. If you have any questions, ask your nurse or doctor.          STOP taking these medications    methocarbamol 500 MG tablet Commonly known as:  ROBAXIN Stopped by: Muadh Creasy X Jhanvi Drakeford, NP   naloxone 4 MG/0.1ML Liqd nasal spray kit Commonly known as: NARCAN Stopped by: Maliha Outten X Danni Leabo, NP   oxyCODONE 5 MG immediate release tablet Commonly known as: Oxy IR/ROXICODONE Stopped by: Kortney Potvin X Desarea Ohagan, NP       TAKE these medications    atorvastatin 80 MG tablet Commonly known as: LIPITOR TAKE 1 TABLET(80 MG) BY MOUTH DAILY   Biotrue Soln Place 1 drop into both eyes as needed (dry eyes).   budesonide-formoterol 80-4.5 MCG/ACT inhaler Commonly known as: SYMBICORT Inhale 2 puffs into the lungs in the morning and at bedtime. Also uses as needed for emergency purposes   carvedilol 12.5 MG tablet Commonly known as: COREG Take 1 tablet (12.5 mg total) by mouth 2 (two) times daily with a meal. Please call [phone] to schedule an appointment for future refills.  1st attempt. Thank you.   cetirizine 10 MG tablet Commonly known as: ZYRTEC Take 10 mg by mouth at bedtime.   clopidogrel 75 MG tablet Commonly known as: PLAVIX TAKE 1 TABLET BY MOUTH EVERY DAY   gabapentin 100 MG capsule Commonly known as: NEURONTIN Take 100 mg by mouth in the morning, at noon, and at bedtime.   ipratropium 0.03 % nasal spray Commonly known as: ATROVENT Place 2 sprays into both nostrils 3 (three) times daily as needed for rhinitis.   lisinopril 40 MG tablet Commonly known as: ZESTRIL Take 40 mg by mouth daily.   MULTIVITAMIN WOMEN 50+ PO Take 1 tablet by mouth daily.   nitroGLYCERIN 0.4 MG SL tablet Commonly known as: NITROSTAT Place 1 tablet (0.4 mg total) under the tongue as needed.   OVER THE COUNTER MEDICATION Apply 1 Application topically as needed (pain). Magne Sport Balm   pantoprazole 40 MG tablet Commonly known as: PROTONIX Take 1 tablet (40 mg total) by mouth 2 (two) times daily before a meal.   Pepcid AC 10 MG tablet Generic drug: famotidine Take 10 mg by mouth daily as needed for heartburn or indigestion.   Phazyme Ultimate 500 MG  Caps Generic drug: Simethicone Take 500 mg by mouth daily as needed (bloating).   sucralfate 1 g tablet Commonly known as: Carafate Take your Protonix at least 30 minutes prior to your first morning dose of Carafate.   traMADol 50 MG tablet Commonly known as: ULTRAM Take 1-2 tablets (50-100 mg total) by mouth every 6 (six) hours as needed for moderate pain.        Review of Systems:  Review of Systems  Constitutional:  Positive for appetite change and fatigue. Negative for unexpected weight change.  HENT:  Negative for congestion.   Eyes:  Negative for visual disturbance.  Respiratory:  Negative for cough, chest tightness, shortness of breath and wheezing.   Cardiovascular:  Negative for chest pain, palpitations and leg swelling.  Gastrointestinal:  Negative for abdominal pain, constipation, nausea and vomiting.       Hx of colon abscess  Genitourinary:  Negative for dysuria, frequency and urgency.       Bathroom trip x1/night, s/p hysterectomy, hx of kidney stones.   Musculoskeletal:  Positive for arthralgias. Negative for gait problem.  Skin:  Negative for color change.  Neurological:  Negative for dizziness, speech difficulty, weakness, numbness and headaches.  Psychiatric/Behavioral:  Negative for behavioral problems and sleep disturbance. The patient is not nervous/anxious.        C/o sleeps too much sometimes.     Health Maintenance  Topic Date Due   Zoster Vaccines- Shingrix (1 of 2) Never done   DEXA SCAN  Never done   COVID-19 Vaccine (7 - 2023-24 season) 04/03/2022   Pneumonia Vaccine 47+ Years old (2 of 2 - PCV) 09/17/2022   Medicare Annual Wellness (AWV)  01/01/2023   INFLUENZA VACCINE  03/04/2023   DTaP/Tdap/Td (2 - Td or Tdap) 02/22/2024   HPV VACCINES  Aged Out    Physical Exam: Vitals:   01/07/23 1431  BP: 132/80  Pulse: (!) 18  Resp: 18  Temp: (!) 97.3 F (36.3 C)  SpO2: 95%  Weight: 182 lb (82.6 kg)  Height: 5' 5.5" (1.664 m)   Body mass  index is 29.83 kg/m. Physical Exam Vitals and nursing note reviewed.  Constitutional:      Appearance: Normal appearance.  HENT:     Head: Normocephalic and atraumatic.  Nose: Nose normal.     Mouth/Throat:     Mouth: Mucous membranes are moist.  Eyes:     Extraocular Movements: Extraocular movements intact.     Conjunctiva/sclera: Conjunctivae normal.     Pupils: Pupils are equal, round, and reactive to light.  Cardiovascular:     Rate and Rhythm: Normal rate and regular rhythm.     Heart sounds: No murmur heard. Pulmonary:     Effort: Pulmonary effort is normal.     Breath sounds: No wheezing, rhonchi or rales.  Abdominal:     General: Bowel sounds are normal.     Palpations: Abdomen is soft.     Tenderness: There is no abdominal tenderness.  Musculoskeletal:        General: Tenderness present.     Cervical back: Normal range of motion and neck supple.     Right lower leg: No edema.     Left lower leg: No edema.     Comments: Pain in the left knee, s/p replacement, slightly warmer than the right. S/p left hip replacement. R shoulder limited ROM-opted out surgical intervention.   Skin:    General: Skin is warm and dry.  Neurological:     General: No focal deficit present.     Mental Status: She is alert and oriented to person, place, and time. Mental status is at baseline.     Gait: Gait normal.  Psychiatric:        Mood and Affect: Mood normal.        Behavior: Behavior normal.        Thought Content: Thought content normal.        Judgment: Judgment normal.     Labs reviewed: Basic Metabolic Panel: Recent Labs    02/11/22 1631 02/17/22 0330  NA 141 141  K 4.3 3.8  CL 104 110  CO2 25 25  GLUCOSE 149* 185*  BUN 26* 20  CREATININE 0.64 0.62  CALCIUM 10.6* 8.9   Liver Function Tests: Recent Labs    02/11/22 1631  AST 78*  ALT 85*  ALKPHOS 70  BILITOT 2.3*  PROT 6.7  ALBUMIN 4.7   Recent Labs    02/11/22 1631  LIPASE 26   No results for  input(s): "AMMONIA" in the last 8760 hours. CBC: Recent Labs    02/11/22 1631 02/17/22 0330  WBC 7.9 11.8*  HGB 12.8 10.9*  HCT 38.2 32.7*  MCV 92.9 94.0  PLT 155 144*   Lipid Panel: No results for input(s): "CHOL", "HDL", "LDLCALC", "TRIG", "CHOLHDL", "LDLDIRECT" in the last 8760 hours. Lab Results  Component Value Date   HGBA1C 6.4 (H) 12/15/2021    Procedures since last visit: No results found.  Assessment/Plan  Essential hypertension  taking Carvedilol, Lisinopril. Bun/creat 20/0.62 02/17/22, blood pressure is controlled  Dyslipidemia, goal LDL below 70  taking Atorvastatin  Ureteral stone with hydronephrosis Hx of UTI  Osteoarthritis, multiple sites s/p L knee replacement, R shoulder pain, prn Methocarbamol, Oxycodone, Tramadol.   CAD S/P percutaneous coronary angioplasty  s/p CABG x3 11/21/18, (prior stent drug eluting), hx of tachycardia, on oral nitrate, Plavix, Carvedilol, Lisinopril  GERD (gastroesophageal reflux disease)  on Famotidine, Pantoprazole, Sucralfate, Hgb 10.9 02/17/22  Overweight (BMI 25.0-29.9) BMI 29.83   Labs/tests ordered:  CBC/diff, CMP/eGFR, TSH, lipids, Vit D, Hgb A1c.   Next appt: 3 months.

## 2023-01-07 NOTE — Assessment & Plan Note (Signed)
Hx of UTI

## 2023-01-07 NOTE — Assessment & Plan Note (Signed)
s/p L knee replacement, R shoulder pain, prn Methocarbamol, Oxycodone, Tramadol.

## 2023-01-07 NOTE — Assessment & Plan Note (Signed)
taking Carvedilol, Lisinopril. Bun/creat 20/0.62 02/17/22, blood pressure is controlled

## 2023-01-07 NOTE — Assessment & Plan Note (Signed)
s/p CABG x3 11/21/18, (prior stent drug eluting), hx of tachycardia, on oral nitrate, Plavix, Carvedilol, Lisinopril

## 2023-01-07 NOTE — Assessment & Plan Note (Signed)
on Famotidine, Pantoprazole, Sucralfate, Hgb 10.9 02/17/22

## 2023-01-07 NOTE — Assessment & Plan Note (Signed)
BMI 29.83

## 2023-01-15 ENCOUNTER — Encounter: Payer: Self-pay | Admitting: Nurse Practitioner

## 2023-01-15 ENCOUNTER — Other Ambulatory Visit: Payer: Self-pay | Admitting: Nurse Practitioner

## 2023-01-15 DIAGNOSIS — E039 Hypothyroidism, unspecified: Secondary | ICD-10-CM | POA: Insufficient documentation

## 2023-01-15 DIAGNOSIS — R7303 Prediabetes: Secondary | ICD-10-CM

## 2023-01-15 LAB — CBC WITH DIFFERENTIAL/PLATELET
Absolute Monocytes: 543 cells/uL (ref 200–950)
Basophils Absolute: 60 cells/uL (ref 0–200)
Basophils Relative: 0.9 %
Eosinophils Absolute: 168 cells/uL (ref 15–500)
Eosinophils Relative: 2.5 %
HCT: 39.2 % (ref 35.0–45.0)
Hemoglobin: 13 g/dL (ref 11.7–15.5)
Lymphs Abs: 2003 cells/uL (ref 850–3900)
MCH: 30 pg (ref 27.0–33.0)
MCHC: 33.2 g/dL (ref 32.0–36.0)
MCV: 90.5 fL (ref 80.0–100.0)
MPV: 9.7 fL (ref 7.5–12.5)
Monocytes Relative: 8.1 %
Neutro Abs: 3926 cells/uL (ref 1500–7800)
Neutrophils Relative %: 58.6 %
Platelets: 169 10*3/uL (ref 140–400)
RBC: 4.33 10*6/uL (ref 3.80–5.10)
RDW: 13.1 % (ref 11.0–15.0)
Total Lymphocyte: 29.9 %
WBC: 6.7 10*3/uL (ref 3.8–10.8)

## 2023-01-15 LAB — VITAMIN D 1,25 DIHYDROXY
Vitamin D 1, 25 (OH)2 Total: 50 pg/mL (ref 18–72)
Vitamin D2 1, 25 (OH)2: <8 pg/mL
Vitamin D3 1, 25 (OH)2: 50 pg/mL

## 2023-01-15 LAB — HEMOGLOBIN A1C
Hgb A1c MFr Bld: 6.4 % of total Hgb — ABNORMAL HIGH (ref <5.7)
Mean Plasma Glucose: 137 mg/dL
eAG (mmol/L): 7.6 mmol/L

## 2023-01-15 LAB — COMPLETE METABOLIC PANEL WITH GFR
AG Ratio: 2.1 (calc) (ref 1.0–2.5)
ALT: 16 U/L (ref 6–29)
AST: 16 U/L (ref 10–35)
Albumin: 4.5 g/dL (ref 3.6–5.1)
Alkaline phosphatase (APISO): 92 U/L (ref 37–153)
BUN: 20 mg/dL (ref 7–25)
CO2: 28 mmol/L (ref 20–32)
Calcium: 9.5 mg/dL (ref 8.6–10.4)
Chloride: 104 mmol/L (ref 98–110)
Creat: 0.65 mg/dL (ref 0.60–0.95)
Globulin: 2.1 g/dL (calc) (ref 1.9–3.7)
Glucose, Bld: 121 mg/dL — ABNORMAL HIGH (ref 65–99)
Potassium: 4 mmol/L (ref 3.5–5.3)
Sodium: 141 mmol/L (ref 135–146)
Total Bilirubin: 0.9 mg/dL (ref 0.2–1.2)
Total Protein: 6.6 g/dL (ref 6.1–8.1)
eGFR: 87 mL/min/{1.73_m2} (ref >=60)

## 2023-01-15 LAB — TSH: TSH: 6.27 mIU/L — ABNORMAL HIGH (ref 0.40–4.50)

## 2023-01-15 LAB — LIPID PANEL
Cholesterol: 166 mg/dL (ref <200)
HDL: 53 mg/dL (ref >=50)
LDL Cholesterol (Calc): 83 mg/dL (calc)
Non-HDL Cholesterol (Calc): 113 mg/dL (calc) (ref <130)
Total CHOL/HDL Ratio: 3.1 (calc) (ref <5.0)
Triglycerides: 201 mg/dL — ABNORMAL HIGH (ref <150)

## 2023-01-18 ENCOUNTER — Other Ambulatory Visit: Payer: Self-pay | Admitting: *Deleted

## 2023-01-18 ENCOUNTER — Other Ambulatory Visit: Payer: Self-pay

## 2023-01-18 DIAGNOSIS — E039 Hypothyroidism, unspecified: Secondary | ICD-10-CM

## 2023-01-18 MED ORDER — CARVEDILOL 12.5 MG PO TABS: 12.5000 mg | ORAL_TABLET | Freq: Two times a day (BID) | ORAL | 0 refills | Status: DC

## 2023-01-20 LAB — HEMOGLOBIN A1C
Hgb A1c MFr Bld: 6.4 % of total Hgb — ABNORMAL HIGH (ref <5.7)
Mean Plasma Glucose: 137 mg/dL
eAG (mmol/L): 7.6 mmol/L

## 2023-01-20 LAB — TSH: TSH: 4.64 mIU/L — ABNORMAL HIGH (ref 0.40–4.50)

## 2023-01-21 ENCOUNTER — Encounter: Payer: Self-pay | Admitting: Nurse Practitioner

## 2023-01-21 ENCOUNTER — Non-Acute Institutional Stay: Payer: Medicare Other | Admitting: Nurse Practitioner

## 2023-01-21 VITALS — BP 140/68 | HR 71 | Temp 98.1°F | Resp 18 | Ht 65.5 in | Wt 185.0 lb

## 2023-01-21 DIAGNOSIS — Z9861 Coronary angioplasty status: Secondary | ICD-10-CM

## 2023-01-21 DIAGNOSIS — R7303 Prediabetes: Secondary | ICD-10-CM

## 2023-01-21 DIAGNOSIS — I2510 Atherosclerotic heart disease of native coronary artery without angina pectoris: Secondary | ICD-10-CM | POA: Diagnosis not present

## 2023-01-21 DIAGNOSIS — E039 Hypothyroidism, unspecified: Secondary | ICD-10-CM | POA: Diagnosis not present

## 2023-01-21 DIAGNOSIS — I10 Essential (primary) hypertension: Secondary | ICD-10-CM

## 2023-01-21 DIAGNOSIS — J45909 Unspecified asthma, uncomplicated: Secondary | ICD-10-CM

## 2023-01-21 DIAGNOSIS — G629 Polyneuropathy, unspecified: Secondary | ICD-10-CM | POA: Insufficient documentation

## 2023-01-21 DIAGNOSIS — E785 Hyperlipidemia, unspecified: Secondary | ICD-10-CM

## 2023-01-21 DIAGNOSIS — T7840XA Allergy, unspecified, initial encounter: Secondary | ICD-10-CM | POA: Insufficient documentation

## 2023-01-21 NOTE — Assessment & Plan Note (Signed)
taking Atorvastatin, LDL 83 01/12/23

## 2023-01-21 NOTE — Assessment & Plan Note (Signed)
on Famotidine, Pantoprazole, Sucralfate, Hgb 13 01/12/23

## 2023-01-21 NOTE — Assessment & Plan Note (Signed)
CAD s/p CABG x3 11/21/18, (prior stent drug eluting), hx of tachycardia, on oral nitrate, Plavix, Carvedilol, Lisinopril

## 2023-01-21 NOTE — Assessment & Plan Note (Signed)
Taking Gabapentin, Tramadol. Reduce hs gabapentin 300mg  hs, continue 300mg  bid for now.

## 2023-01-21 NOTE — Progress Notes (Signed)
Location:   Clinic [facility]   Place of Service:  Clinic (12) Provider: Chipper Oman NP  Code Status: DNR Goals of Care:     01/07/2023   12:50 PM  Advanced Directives  Does Patient Have a Medical Advance Directive? Yes  Type of Estate agent of [place];Living will  Does patient want to make changes to medical advance directive? No - Patient declined  Copy of Healthcare Power of Attorney in Chart? Yes - validated most recent copy scanned in chart (See row information)     Chief Complaint  Patient presents with   Medical Management of Chronic Issues    Patient is here for a follow up on TSH labs    HPI: Patient is a 82 y.o. female seen today for medical management of chronic diseases.       HTN, taking Carvedilol, Lisinopril. Bun/creat 20/0.65 01/12/23  Prediabetes, A1c 6.4 01/12/23  Elevated TSH 6.27 01/12/23>>4.64 01/19/23             HLD, taking Atorvastatin, LDL 83 01/12/23             Neuropathy, on Gabapentin             Hx of UTI, s/p kidney stones surgical removal.              OA, s/p L knee replacement, R hip, L shoulder pain-limited ROM, s/p Ortho, no further surgery, on Tramadol. Vit D 50 01/12/23             Insomnia, hx of              CAD s/p CABG x3 11/21/18, (prior stent drug eluting), hx of tachycardia, on oral nitrate, Plavix, Carvedilol, Lisinopril             GERD, on Famotidine, Pantoprazole, Sucralfate, Hgb 13 01/12/23             Asthma, Symbicort, Atrovent nasal spray    Past Medical History:  Diagnosis Date   Anemia     mild as per PCP   Anginal pain (HCC)    06/20/20, relieved    Arthritis    hip/ s/p recent shoulder fracture 8/12- RIGHT   Asthma    Coronary artery disease 05/30/2018   LAD PCI/DES   GI bleeding    Hiatal hernia    History of kidney stones    Last year had surgery   Humerus fracture    with wrist on right side   Hypertension    hypercholesterolemia/  EKG on chart with clearance and note 06/18/11  [facility]    Pneumonia    Pre-diabetes     Past Surgical History:  Procedure Laterality Date   ABDOMINAL HYSTERECTOMY     COLECTOMY WITH COLOSTOMY CREATION/HARTMANN PROCEDURE N/A 05/07/2016   Procedure: COLOSTOMY CREATION/HARTMANN PROCEDURE;  Surgeon: Romie Levee, MD;  Location: [facility];  Service: General;  Laterality: N/A;   COLONOSCOPY N/A 08/26/2016   Procedure: COLONOSCOPY;  Surgeon: Romie Levee, MD;  Location: [facility];  Service: Endoscopy;  Laterality: N/A;   COLOSTOMY TAKEDOWN N/A 08/27/2016   Procedure: LAPAROSCOPIC COLOSTOMY REVERSAL;  Surgeon: Romie Levee, MD;  Location: [facility];  Service: General;  Laterality: N/A;   CORONARY ARTERY BYPASS GRAFT N/A 11/21/2018   Procedure: CORONARY ARTERY BYPASS GRAFTING (CABG) x Three , using left internal mammary artery and right leg greater saphenous vein harvested endoscopically;  Surgeon: Alleen Borne, MD;  Location: [facility];  Service: Open Heart Surgery;  Laterality: N/A;   CORONARY STENT INTERVENTION N/A 05/30/2018   Procedure: CORONARY STENT INTERVENTION;  Surgeon: Runell Gess, MD;  Location: [facility];  Service: Cardiovascular;  Laterality: N/A;   CYSTOSCOPY WITH RETROGRADE PYELOGRAM, URETEROSCOPY AND STENT PLACEMENT Bilateral 06/21/2020   Procedure: CYSTOSCOPY WITH RETROGRADE PYELOGRAM, AND BILATERAL STENT PLACEMENT;  Surgeon: Sebastian Ache, MD;  Location: [facility];  Service: Urology;  Laterality: Bilateral;   CYSTOSCOPY WITH RETROGRADE PYELOGRAM, URETEROSCOPY AND STENT PLACEMENT Bilateral 07/12/2020   Procedure: CYSTOSCOPY WITH RETROGRADE PYELOGRAM, URETEROSCOPY AND STENT PLACEMENT;  Surgeon: Crist Fat, MD;  Location: [facility];  Service: Urology;  Laterality: Bilateral;  2 HRS   EYE SURGERY     eyelid tuck bilateral   HOLMIUM LASER APPLICATION Bilateral 07/12/2020   Procedure: HOLMIUM LASER APPLICATION;  Surgeon: Crist Fat, MD;  Location: [facility];  Service: Urology;  Laterality: Bilateral;   LAPAROTOMY N/A  05/07/2016   Procedure: EXPLORATORY LAPAROTOMY, LYLSIS OF ADHSIONS, Pam Drown OF PERITONEAL ABSCESS;  Surgeon: Romie Levee, MD;  Location: [facility];  Service: General;  Laterality: N/A;   LEFT HEART CATH AND CORONARY ANGIOGRAPHY N/A 05/30/2018   Procedure: LEFT HEART CATH AND CORONARY ANGIOGRAPHY;  Surgeon: Runell Gess, MD;  [facility]: [facility];  Service: Cardiovascular;  Laterality: N/A;   LEFT HEART CATH AND CORONARY ANGIOGRAPHY N/A 11/17/2018   Procedure: LEFT HEART CATH AND CORONARY ANGIOGRAPHY;  Surgeon: Runell Gess, MD;  Location: [facility];  Service: Cardiovascular;  Laterality: N/A;   PARATHYROIDECTOMY     one lobe- benign per pt   TEE WITHOUT CARDIOVERSION N/A 11/21/2018   Procedure: TRANSESOPHAGEAL ECHOCARDIOGRAM (TEE);  Surgeon: Alleen Borne, MD;  Location: [facility];  Service: Open Heart Surgery;  Laterality: N/A;   TONSILLECTOMY     TOTAL HIP ARTHROPLASTY  07/08/2011   Procedure: TOTAL HIP ARTHROPLASTY;  Surgeon: Gus Rankin Aluisio;  Location: [facility];  Service: Orthopedics;  Laterality: Right;   TOTAL KNEE ARTHROPLASTY Left 02/16/2022   Procedure: TOTAL KNEE ARTHROPLASTY;  Surgeon: Ollen Gross, MD;  Location: [facility];  Service: Orthopedics;  Laterality: Left;    Allergies  Allergen Reactions   Morphine Other (See Comments)    Medication did not work for pain    Penicillins Other (See Comments)    UNSPECIFIED REACTION  Unknown reaction as a child.  Tolerated Zosyn 2017. Tolerated Cephalosporin Date: 02/17/22.     Pregabalin Anxiety    Lyrica    Pregabalin Swelling and Anxiety    Lyrica   anxiety    Allergies as of 01/21/2023       Reactions   Morphine Other (See Comments)   Medication did not work for pain    Penicillins Other (See Comments)   UNSPECIFIED REACTION  Unknown reaction as a child.  Tolerated Zosyn 2017. Tolerated Cephalosporin Date: 02/17/22.   Pregabalin Anxiety   Lyrica    Pregabalin Swelling, Anxiety   Lyrica  anxiety         Medication List        Accurate as of January 21, 2023 11:59 PM. If you have any questions, ask your nurse or doctor.          atorvastatin 80 MG tablet Commonly known as: LIPITOR TAKE 1 TABLET(80 MG) BY MOUTH DAILY   Biotrue Soln Place 1 drop into both eyes as needed (dry eyes).   budesonide-formoterol 80-4.5 MCG/ACT inhaler Commonly known as: SYMBICORT Inhale 2 puffs into the lungs in the morning and  at bedtime. Also uses as needed for emergency purposes   carvedilol 12.5 MG tablet Commonly known as: COREG Take 1 tablet (12.5 mg total) by mouth 2 (two) times daily with a meal. Please call [phone] to schedule an appointment for future refills. 2nd  attempt. Thank you.   cetirizine 10 MG tablet Commonly known as: ZYRTEC Take 10 mg by mouth at bedtime.   clopidogrel 75 MG tablet Commonly known as: PLAVIX TAKE 1 TABLET BY MOUTH EVERY DAY   gabapentin 100 MG capsule Commonly known as: NEURONTIN Take 300 mg by mouth 3 (three) times daily.   ipratropium 0.03 % nasal spray Commonly known as: ATROVENT Place 2 sprays into both nostrils 3 (three) times daily as needed for rhinitis.   lisinopril 40 MG tablet Commonly known as: ZESTRIL Take 40 mg by mouth daily.   MULTIVITAMIN WOMEN 50+ PO Take 1 tablet by mouth daily.   nitroGLYCERIN 0.4 MG SL tablet Commonly known as: NITROSTAT Place 1 tablet (0.4 mg total) under the tongue as needed.   OVER THE COUNTER MEDICATION Apply 1 Application topically as needed (pain). Magne Sport Balm   pantoprazole 40 MG tablet Commonly known as: PROTONIX Take 1 tablet (40 mg total) by mouth 2 (two) times daily before a meal.   Pepcid AC 10 MG tablet Generic drug: famotidine Take 10 mg by mouth daily as needed for heartburn or indigestion.   Phazyme Ultimate 500 MG Caps Generic drug: Simethicone Take 500 mg by mouth daily as needed (bloating).   sucralfate 1 g tablet Commonly known as: Carafate Take your Protonix at  least 30 minutes prior to your first morning dose of Carafate.   traMADol 50 MG tablet Commonly known as: ULTRAM Take 1-2 tablets (50-100 mg total) by mouth every 6 (six) hours as needed for moderate pain.        Review of Systems:  Review of Systems  Constitutional:  Negative for appetite change, fatigue and unexpected weight change.  HENT:  Negative for congestion.   Eyes:  Negative for visual disturbance.  Respiratory:  Negative for cough, chest tightness, shortness of breath and wheezing.   Cardiovascular:  Negative for chest pain, palpitations and leg swelling.  Gastrointestinal:  Negative for abdominal pain, constipation, nausea and vomiting.       Hx of colon abscess  Genitourinary:  Negative for dysuria, frequency and urgency.       Bathroom trip x1/night, s/p hysterectomy, hx of kidney stones.   Musculoskeletal:  Positive for arthralgias. Negative for gait problem.  Skin:  Negative for color change.  Neurological:  Negative for dizziness, speech difficulty, weakness, numbness and headaches.  Psychiatric/Behavioral:  Negative for behavioral problems and sleep disturbance. The patient is not nervous/anxious.        C/o sleeps too much sometimes.     Health Maintenance  Topic Date Due   Zoster Vaccines- Shingrix (1 of 2) Never done   DEXA SCAN  Never done   COVID-19 Vaccine (9 - 2023-24 season) 04/03/2022   Pneumonia Vaccine 5+ Years old (2 of 2 - PCV) 09/17/2022   Medicare Annual Wellness (AWV)  01/01/2023   INFLUENZA VACCINE  03/04/2023   DTaP/Tdap/Td (2 - Td or Tdap) 02/22/2024   HPV VACCINES  Aged Out    Physical Exam: Vitals:   01/21/23 1010  BP: (!) 140/68  Pulse: 71  Resp: 18  Temp: 98.1 F (36.7 C)  SpO2: 98%  Weight: 185 lb (83.9 kg)  Height: 5' 5.5" (1.664 m)  Body mass index is 30.32 kg/m. Physical Exam Vitals and nursing note reviewed.  Constitutional:      Appearance: Normal appearance.  HENT:     Head: Normocephalic and atraumatic.      Nose: Nose normal.     Mouth/Throat:     Mouth: Mucous membranes are moist.  Eyes:     Extraocular Movements: Extraocular movements intact.     Conjunctiva/sclera: Conjunctivae normal.     Pupils: Pupils are equal, round, and reactive to light.  Cardiovascular:     Rate and Rhythm: Normal rate and regular rhythm.     Heart sounds: No murmur heard. Pulmonary:     Effort: Pulmonary effort is normal.     Breath sounds: No rales.  Abdominal:     General: Bowel sounds are normal.     Palpations: Abdomen is soft.     Tenderness: There is no abdominal tenderness.  Musculoskeletal:        General: Tenderness present.     Cervical back: Normal range of motion and neck supple.     Right lower leg: No edema.     Left lower leg: No edema.     Comments: Pain in the left knee, s/p replacement, slightly warmer than the right. S/p left hip replacement. R shoulder limited ROM-opted out surgical intervention.   Skin:    General: Skin is warm and dry.  Neurological:     General: No focal deficit present.     Mental Status: She is alert and oriented to person, place, and time. Mental status is at baseline.     Gait: Gait normal.  Psychiatric:        Mood and Affect: Mood normal.        Behavior: Behavior normal.        Thought Content: Thought content normal.        Judgment: Judgment normal.     Labs reviewed: Basic Metabolic Panel: Recent Labs    02/11/22 1631 02/17/22 0330 01/12/23 0710 01/19/23 0734  NA 141 141 141  --   K 4.3 3.8 4.0  --   CL 104 110 104  --   CO2 25 25 28   --   GLUCOSE 149* 185* 121*  --   BUN 26* 20 20  --   CREATININE 0.64 0.62 0.65  --   CALCIUM 10.6* 8.9 9.5  --   TSH  --   --  6.27* 4.64*   Liver Function Tests: Recent Labs    02/11/22 1631 01/12/23 0710  AST 78* 16  ALT 85* 16  ALKPHOS 70  --   BILITOT 2.3* 0.9  PROT 6.7 6.6  ALBUMIN 4.7  --    Recent Labs    02/11/22 1631  LIPASE 26   No results for input(s): "AMMONIA" in the last 8760  hours. CBC: Recent Labs    02/11/22 1631 02/17/22 0330 01/12/23 0710  WBC 7.9 11.8* 6.7  NEUTROABS  --   --  3,926  HGB 12.8 10.9* 13.0  HCT 38.2 32.7* 39.2  MCV 92.9 94.0 90.5  PLT 155 144* 169   Lipid Panel: Recent Labs    01/12/23 0710  CHOL 166  HDL 53  LDLCALC 83  TRIG 201*  CHOLHDL 3.1   Lab Results  Component Value Date   HGBA1C 6.4 (H) 01/19/2023    Procedures since last visit: No results found.  Assessment/Plan  Hypothyroidism Elevated TSH 6.27 01/12/23>>4.64 01/19/23, observe f/u TSH 3 months.   Essential  hypertension  taking Carvedilol, Lisinopril. Bun/creat 20/0.65 01/12/23  Dyslipidemia, goal LDL below 70  taking Atorvastatin, LDL 83 01/12/23  Osteoarthritis, multiple sites on Gabapentin,  s/p L knee replacement, R hip, L shoulder pain-limited ROM, s/p Ortho, no further surgery, on Tramadol. Vit D 50 01/12/23  CAD S/P percutaneous coronary angioplasty    CAD s/p CABG x3 11/21/18, (prior stent drug eluting), hx of tachycardia, on oral nitrate, Plavix, Carvedilol, Lisinopril  GERD (gastroesophageal reflux disease)  on Famotidine, Pantoprazole, Sucralfate, Hgb 13 01/12/23  Allergy  Symbicort, Atrovent nasal spray  Asthma  Symbicort, Atrovent nasal spray, stable    Neuropathy Taking Gabapentin, Tramadol. Reduce hs gabapentin 300mg  hs, continue 300mg  bid for now.   Prediabetes Hgb A1c 6.4 01/19/23, diet, exercise, f/u Hgb A1c 3 months.    Labs/tests ordered:  TSH and Hgb A1c prior to the next appt  Next appt:  04/08/2023

## 2023-01-21 NOTE — Assessment & Plan Note (Signed)
Symbicort, Atrovent nasal spray, stable

## 2023-01-21 NOTE — Assessment & Plan Note (Signed)
Symbicort, Atrovent nasal spray

## 2023-01-21 NOTE — Assessment & Plan Note (Signed)
on Gabapentin,  s/p L knee replacement, R hip, L shoulder pain-limited ROM, s/p Ortho, no further surgery, on Tramadol. Vit D 50 01/12/23

## 2023-01-21 NOTE — Assessment & Plan Note (Signed)
taking Carvedilol, Lisinopril. Bun/creat 20/0.65 01/12/23

## 2023-01-21 NOTE — Assessment & Plan Note (Signed)
Elevated TSH 6.27 01/12/23>>4.64 01/19/23, observe f/u TSH 3 months.

## 2023-01-22 ENCOUNTER — Encounter: Payer: Self-pay | Admitting: Nurse Practitioner

## 2023-01-22 DIAGNOSIS — R7303 Prediabetes: Secondary | ICD-10-CM | POA: Insufficient documentation

## 2023-01-22 DIAGNOSIS — E1159 Type 2 diabetes mellitus with other circulatory complications: Secondary | ICD-10-CM | POA: Insufficient documentation

## 2023-01-22 NOTE — Assessment & Plan Note (Signed)
Hgb A1c 6.4 01/19/23, diet, exercise, f/u Hgb A1c 3 months.

## 2023-02-12 ENCOUNTER — Other Ambulatory Visit: Payer: Self-pay | Admitting: Cardiovascular Disease

## 2023-02-18 ENCOUNTER — Encounter: Payer: Medicare Other | Admitting: Family

## 2023-02-18 ENCOUNTER — Encounter: Payer: Self-pay | Admitting: Nurse Practitioner

## 2023-02-18 ENCOUNTER — Non-Acute Institutional Stay: Payer: Medicare Other | Admitting: Nurse Practitioner

## 2023-02-18 VITALS — BP 132/78 | HR 89 | Temp 97.5°F | Resp 18 | Ht 65.0 in | Wt 184.8 lb

## 2023-02-18 DIAGNOSIS — I10 Essential (primary) hypertension: Secondary | ICD-10-CM

## 2023-02-18 DIAGNOSIS — R7303 Prediabetes: Secondary | ICD-10-CM | POA: Diagnosis not present

## 2023-02-18 DIAGNOSIS — R7989 Other specified abnormal findings of blood chemistry: Secondary | ICD-10-CM

## 2023-02-18 DIAGNOSIS — E785 Hyperlipidemia, unspecified: Secondary | ICD-10-CM

## 2023-02-18 DIAGNOSIS — I2510 Atherosclerotic heart disease of native coronary artery without angina pectoris: Secondary | ICD-10-CM

## 2023-02-18 DIAGNOSIS — N132 Hydronephrosis with renal and ureteral calculous obstruction: Secondary | ICD-10-CM

## 2023-02-18 DIAGNOSIS — K219 Gastro-esophageal reflux disease without esophagitis: Secondary | ICD-10-CM

## 2023-02-18 DIAGNOSIS — M159 Polyosteoarthritis, unspecified: Secondary | ICD-10-CM

## 2023-02-18 DIAGNOSIS — K8020 Calculus of gallbladder without cholecystitis without obstruction: Secondary | ICD-10-CM

## 2023-02-18 DIAGNOSIS — G629 Polyneuropathy, unspecified: Secondary | ICD-10-CM

## 2023-02-18 MED ORDER — PREGABALIN 25 MG PO CAPS
25.0000 mg | ORAL_CAPSULE | Freq: Two times a day (BID) | ORAL | 0 refills | Status: DC
Start: 2023-02-18 — End: 2023-03-16

## 2023-02-18 NOTE — Assessment & Plan Note (Signed)
A1c 6.4 01/12/23, 6.4 01/19/23

## 2023-02-18 NOTE — Assessment & Plan Note (Addendum)
Desires to stop taking Gabapentin, try Lyrica 25mg  bid. Observe.

## 2023-02-18 NOTE — Assessment & Plan Note (Signed)
Hx of UTI, s/p kidney stones surgical removal.

## 2023-02-18 NOTE — Assessment & Plan Note (Signed)
Stable,  Symbicort, Atrovent nasal spray

## 2023-02-18 NOTE — Assessment & Plan Note (Signed)
taking Atorvastatin, LDL 83 01/12/23

## 2023-02-18 NOTE — Assessment & Plan Note (Signed)
Elevated TSH 6.27 01/12/23>>4.64 01/19/23, monitor.

## 2023-02-18 NOTE — Assessment & Plan Note (Addendum)
02/12/22 CT abd Cholelithiasis with mild gallbladder wall thickening, LFT wnl 01/12/23. C/o RLQ pain resolves after BM, negative Murphy's sign, denied abd pain associated with nausea, vomiting, she is afebrile.  Will repeat US abd complete for surveillance

## 2023-02-18 NOTE — Assessment & Plan Note (Signed)
Stable, on Famotidine, Pantoprazole, Sucralfate, Hgb 13 01/12/23

## 2023-02-18 NOTE — Assessment & Plan Note (Addendum)
s/p L knee replacement, pain at top of the left knee, will f/u Ortho, R hip, L shoulder pain-limited ROM, s/p Ortho, no further surgery, on Tramadol. Vit D 50 01/12/23. State her left thigh pain some times.

## 2023-02-18 NOTE — Progress Notes (Signed)
Location:   Clinic [facility]   Place of Service:  Clinic (12) Provider: Chipper Oman NP  Code Status: DNR Goals of Care:     02/18/2023    3:42 PM  Advanced Directives  Does Patient Have a Medical Advance Directive? Yes  Type of Estate agent of [place];Living will  Does patient want to make changes to medical advance directive? No - Patient declined  Copy of Healthcare Power of Attorney in Chart? Yes - validated most recent copy scanned in chart (See row information)     No chief complaint on file.   HPI: Patient is a 82 y.o. female seen today for medical management of chronic diseases.    02/12/22 CT abd Cholelithiasis with mild gallbladder wall thickening, LFT wnl 01/12/23. C/o RLQ pain resolves after BM, negative Murphy's sign, denied abd pain associated with nausea, vomiting, she is afebrile.   HTN, taking Carvedilol, Lisinopril. Bun/creat 20/0.65 01/12/23             Prediabetes, A1c 6.4 01/12/23, 6.4 01/19/23             Elevated TSH 6.27 01/12/23>>4.64 01/19/23             HLD, taking Atorvastatin, LDL 83 01/12/23             Neuropathy, off Gabapentin, placed on Lyrica per patient's request.              Hx of UTI, s/p kidney stones surgical removal.              OA, s/p L knee replacement, R hip, L shoulder pain-limited ROM, s/p Ortho, no further surgery, on Tramadol. Vit D 50 01/12/23             Insomnia, hx of              CAD s/p CABG x3 11/21/18, (prior stent drug eluting), hx of tachycardia, on oral nitrate, Plavix, Carvedilol, Lisinopril             GERD, on Famotidine, Pantoprazole, Sucralfate, Hgb 13 01/12/23             Asthma, Symbicort, Atrovent nasal spray     Past Medical History:  Diagnosis Date   Anemia     mild as per PCP   Anginal pain (HCC)    06/20/20, relieved    Arthritis    hip/ s/p recent shoulder fracture 8/12- RIGHT   Asthma    Coronary artery disease 05/30/2018   LAD PCI/DES   GI bleeding    Hiatal hernia    History of  kidney stones    Last year had surgery   Humerus fracture    with wrist on right side   Hypertension    hypercholesterolemia/  EKG on chart with clearance and note 06/18/11  [facility]chi   Pneumonia    Pre-diabetes     Past Surgical History:  Procedure Laterality Date   ABDOMINAL HYSTERECTOMY     COLECTOMY WITH COLOSTOMY CREATION/HARTMANN PROCEDURE N/A 05/07/2016   Procedure: COLOSTOMY CREATION/HARTMANN PROCEDURE;  Surgeon: Romie Levee, MD;  Location: [facility];  Service: General;  Laterality: N/A;   COLONOSCOPY N/A 08/26/2016   Procedure: COLONOSCOPY;  Surgeon: Romie Levee, MD;  Location: [facility];  Service: Endoscopy;  Laterality: N/A;   COLOSTOMY TAKEDOWN N/A 08/27/2016   Procedure: LAPAROSCOPIC COLOSTOMY REVERSAL;  Surgeon: Romie Levee, MD;  Location: [facility];  Service: General;  Laterality: N/A;   CORONARY ARTERY BYPASS GRAFT N/A  11/21/2018   Procedure: CORONARY ARTERY BYPASS GRAFTING (CABG) x Three , using left internal mammary artery and right leg greater saphenous vein harvested endoscopically;  Surgeon: Alleen Borne, MD;  Location: [facility];  Service: Open Heart Surgery;  Laterality: N/A;   CORONARY STENT INTERVENTION N/A 05/30/2018   Procedure: CORONARY STENT INTERVENTION;  Surgeon: Runell Gess, MD;  Location: [facility];  Service: Cardiovascular;  Laterality: N/A;   CYSTOSCOPY WITH RETROGRADE PYELOGRAM, URETEROSCOPY AND STENT PLACEMENT Bilateral 06/21/2020   Procedure: CYSTOSCOPY WITH RETROGRADE PYELOGRAM, AND BILATERAL STENT PLACEMENT;  Surgeon: Sebastian Ache, MD;  Location: [facility];  Service: Urology;  Laterality: Bilateral;   CYSTOSCOPY WITH RETROGRADE PYELOGRAM, URETEROSCOPY AND STENT PLACEMENT Bilateral 07/12/2020   Procedure: CYSTOSCOPY WITH RETROGRADE PYELOGRAM, URETEROSCOPY AND STENT PLACEMENT;  Surgeon: Crist Fat, MD;  Location: [facility];  Service: Urology;  Laterality: Bilateral;  2 HRS   EYE SURGERY     eyelid tuck bilateral   HOLMIUM LASER  APPLICATION Bilateral 07/12/2020   Procedure: HOLMIUM LASER APPLICATION;  Surgeon: Crist Fat, MD;  Location: [facility];  Service: Urology;  Laterality: Bilateral;   LAPAROTOMY N/A 05/07/2016   Procedure: EXPLORATORY LAPAROTOMY, LYLSIS OF ADHSIONS, Pam Drown OF PERITONEAL ABSCESS;  Surgeon: Romie Levee, MD;  Location: [facility];  Service: General;  Laterality: N/A;   LEFT HEART CATH AND CORONARY ANGIOGRAPHY N/A 05/30/2018   Procedure: LEFT HEART CATH AND CORONARY ANGIOGRAPHY;  Surgeon: Runell Gess, MD;  [facility];  Service: Cardiovascular;  Laterality: N/A;   LEFT HEART CATH AND CORONARY ANGIOGRAPHY N/A 11/17/2018   Procedure: LEFT HEART CATH AND CORONARY ANGIOGRAPHY;  Surgeon: Runell Gess, MD;  Location: [facility];  Service: Cardiovascular;  Laterality: N/A;   PARATHYROIDECTOMY     one lobe- benign per pt   TEE WITHOUT CARDIOVERSION N/A 11/21/2018   Procedure: TRANSESOPHAGEAL ECHOCARDIOGRAM (TEE);  Surgeon: Alleen Borne, MD;  Location: [facility] OR;  Service: Open Heart Surgery;  Laterality: N/A;   TONSILLECTOMY     TOTAL HIP ARTHROPLASTY  07/08/2011   Procedure: TOTAL HIP ARTHROPLASTY;  Surgeon: Gus Rankin Aluisio;  Location: [facility];  Service: Orthopedics;  Laterality: Right;   TOTAL KNEE ARTHROPLASTY Left 02/16/2022   Procedure: TOTAL KNEE ARTHROPLASTY;  Surgeon: Ollen Gross, MD;  Location: [facility];  Service: Orthopedics;  Laterality: Left;    Allergies  Allergen Reactions   Morphine Other (See Comments)    Medication did not work for pain    Penicillins Other (See Comments)    UNSPECIFIED REACTION  Unknown reaction as a child.  Tolerated Zosyn 2017. Tolerated Cephalosporin Date: 02/17/22.     Pregabalin Anxiety    Lyrica    Pregabalin Swelling and Anxiety    Lyrica   anxiety    Allergies as of 02/18/2023       Reactions   Morphine Other (See Comments)   Medication did not work for pain    Penicillins Other (See Comments)   UNSPECIFIED  REACTION  Unknown reaction as a child.  Tolerated Zosyn 2017. Tolerated Cephalosporin Date: 02/17/22.   Pregabalin Anxiety   Lyrica    Pregabalin Swelling, Anxiety   Lyrica  anxiety        Medication List        Accurate as of February 18, 2023  4:37 PM. If you have any questions, ask your nurse or doctor.          STOP taking these medications  gabapentin 100 MG capsule Commonly known as: NEURONTIN Stopped by: Filbert Craze X Juanluis Guastella       TAKE these medications    atorvastatin 80 MG tablet Commonly known as: LIPITOR TAKE 1 TABLET(80 MG) BY MOUTH DAILY   Biotrue Soln Place 1 drop into both eyes as needed (dry eyes).   budesonide-formoterol 80-4.5 MCG/ACT inhaler Commonly known as: SYMBICORT Inhale 2 puffs into the lungs in the morning and at bedtime. Also uses as needed for emergency purposes   carvedilol 12.5 MG tablet Commonly known as: COREG TAKE 1 TABLET BY MOUTH 2 TIMES A DAY PLEASE CALL [phone] TO SCHEDULE AN APPOINTMENT FOR FUTURE REFILLS   cetirizine 10 MG tablet Commonly known as: ZYRTEC Take 10 mg by mouth at bedtime.   clopidogrel 75 MG tablet Commonly known as: PLAVIX TAKE 1 TABLET BY MOUTH EVERY DAY   ipratropium 0.03 % nasal spray Commonly known as: ATROVENT Place 2 sprays into both nostrils 3 (three) times daily as needed for rhinitis.   lisinopril 40 MG tablet Commonly known as: ZESTRIL Take 40 mg by mouth daily.   MULTIVITAMIN WOMEN 50+ PO Take 1 tablet by mouth daily.   nitroGLYCERIN 0.4 MG SL tablet Commonly known as: NITROSTAT Place 1 tablet (0.4 mg total) under the tongue as needed.   OVER THE COUNTER MEDICATION Apply 1 Application topically as needed (pain). Magne Sport Balm   pantoprazole 40 MG tablet Commonly known as: PROTONIX Take 1 tablet (40 mg total) by mouth 2 (two) times daily before a meal.   Pepcid AC 10 MG tablet Generic drug: famotidine Take 10 mg by mouth daily as needed for heartburn or indigestion.    Phazyme Ultimate 500 MG Caps Generic drug: Simethicone Take 500 mg by mouth daily as needed (bloating).   pregabalin 25 MG capsule Commonly known as: Lyrica Take 1 capsule (25 mg total) by mouth 2 (two) times daily. Started by: Christon Gallaway X Shatori Bertucci   sucralfate 1 g tablet Commonly known as: Carafate Take your Protonix at least 30 minutes prior to your first morning dose of Carafate.   traMADol 50 MG tablet Commonly known as: ULTRAM Take 1-2 tablets (50-100 mg total) by mouth every 6 (six) hours as needed for moderate pain.        Review of Systems:  Review of Systems  Constitutional:  Negative for appetite change, fatigue and unexpected weight change.  HENT:  Negative for congestion.   Eyes:  Negative for visual disturbance.  Respiratory:  Negative for cough, chest tightness, shortness of breath and wheezing.   Cardiovascular:  Negative for chest pain, palpitations and leg swelling.  Gastrointestinal:  Negative for abdominal pain, constipation, nausea and vomiting.       Hx of colon abscess  Genitourinary:  Negative for dysuria, frequency and urgency.       Bathroom trip x1/night, s/p hysterectomy, hx of kidney stones.   Musculoskeletal:  Positive for arthralgias. Negative for gait problem.  Skin:  Negative for color change.  Neurological:  Negative for dizziness, speech difficulty, weakness, numbness and headaches.  Psychiatric/Behavioral:  Negative for behavioral problems and sleep disturbance. The patient is not nervous/anxious.        C/o sleeps too much sometimes.     Health Maintenance  Topic Date Due   Zoster Vaccines- Shingrix (1 of 2) Never done   DEXA SCAN  Never done   COVID-19 Vaccine (9 - 2023-24 season) 04/03/2022   Pneumonia Vaccine 46+ Years old (2 of 2 - PCV) 09/17/2022  Medicare Annual Wellness (AWV)  01/01/2023   INFLUENZA VACCINE  03/04/2023   DTaP/Tdap/Td (2 - Td or Tdap) 02/22/2024   HPV VACCINES  Aged Out    Physical Exam: Vitals:   02/18/23 1528   BP: 132/78  Pulse: 89  Resp: 18  Temp: (!) 97.5 F (36.4 C)  SpO2: 94%  Weight: 184 lb 12.8 oz (83.8 kg)  Height: 5\' 5"  (1.651 m)   Body mass index is 30.75 kg/m. Physical Exam Vitals and nursing note reviewed.  Constitutional:      Appearance: Normal appearance.  HENT:     Head: Normocephalic and atraumatic.     Nose: Nose normal.     Mouth/Throat:     Mouth: Mucous membranes are moist.  Eyes:     Extraocular Movements: Extraocular movements intact.     Conjunctiva/sclera: Conjunctivae normal.     Pupils: Pupils are equal, round, and reactive to light.  Cardiovascular:     Rate and Rhythm: Normal rate and regular rhythm.     Heart sounds: No murmur heard. Pulmonary:     Effort: Pulmonary effort is normal.     Breath sounds: No rales.  Abdominal:     General: Bowel sounds are normal.     Palpations: Abdomen is soft.     Tenderness: There is no abdominal tenderness.  Musculoskeletal:        General: Tenderness present.     Cervical back: Normal range of motion and neck supple.     Right lower leg: No edema.     Left lower leg: No edema.     Comments: Pain on the top of left knee, s/p replacement, slightly warmer than the right. S/p left hip replacement. R shoulder limited ROM-opted out surgical intervention. C/o pain lateral left thigh.   Skin:    General: Skin is warm and dry.  Neurological:     General: No focal deficit present.     Mental Status: She is alert and oriented to person, place, and time. Mental status is at baseline.     Gait: Gait normal.  Psychiatric:        Mood and Affect: Mood normal.        Behavior: Behavior normal.        Thought Content: Thought content normal.        Judgment: Judgment normal.     Labs reviewed: Basic Metabolic Panel: Recent Labs    01/12/23 0710 01/19/23 0734  NA 141  --   K 4.0  --   CL 104  --   CO2 28  --   GLUCOSE 121*  --   BUN 20  --   CREATININE 0.65  --   CALCIUM 9.5  --   TSH 6.27* 4.64*   Liver  Function Tests: Recent Labs    01/12/23 0710  AST 16  ALT 16  BILITOT 0.9  PROT 6.6   No results for input(s): "LIPASE", "AMYLASE" in the last 8760 hours. No results for input(s): "AMMONIA" in the last 8760 hours. CBC: Recent Labs    01/12/23 0710  WBC 6.7  NEUTROABS 3,926  HGB 13.0  HCT 39.2  MCV 90.5  PLT 169   Lipid Panel: Recent Labs    01/12/23 0710  CHOL 166  HDL 53  LDLCALC 83  TRIG 201*  CHOLHDL 3.1   Lab Results  Component Value Date   HGBA1C 6.4 (H) 01/19/2023    Procedures since last visit: No results found.  Assessment/Plan  Cholelithiasis 02/12/22 CT abd Cholelithiasis with mild gallbladder wall thickening, LFT wnl 01/12/23. C/o RLQ pain resolves after BM, negative Murphy's sign, denied abd pain associated with nausea, vomiting, she is afebrile.  Will repeat US abd complete for surveillance   Essential hypertension Blood pressure is controlled,  taking Carvedilol, Lisinopril. Bun/creat 20/0.65 01/12/23  Prediabetes A1c 6.4 01/12/23, 6.4 01/19/23  Elevated TSH     Elevated TSH 6.27 01/12/23>>4.64 01/19/23, monitor.   Dyslipidemia, goal LDL below 70  taking Atorvastatin, LDL 83 01/12/23  Neuropathy Desires to stop taking Gabapentin, try Lyrica 25mg  bid. Observe.   Ureteral stone with hydronephrosis  Hx of UTI, s/p kidney stones surgical removal.   Osteoarthritis, multiple sites s/p L knee replacement, R hip, L shoulder pain-limited ROM, s/p Ortho, no further surgery, on Tramadol. 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   Next appt:  04/06/2023

## 2023-02-18 NOTE — Assessment & Plan Note (Signed)
CAD s/p CABG x3 11/21/18, (prior stent drug eluting), hx of tachycardia, on oral nitrate, Plavix, Carvedilol, Lisinopril

## 2023-02-18 NOTE — Assessment & Plan Note (Signed)
Blood pressure is controlled,  taking Carvedilol, Lisinopril. Bun/creat 20/0.65 01/12/23

## 2023-02-23 ENCOUNTER — Other Ambulatory Visit: Payer: Self-pay | Admitting: Cardiovascular Disease

## 2023-02-24 ENCOUNTER — Other Ambulatory Visit: Payer: Self-pay

## 2023-02-24 MED ORDER — TRAMADOL HCL 50 MG PO TABS: 50.0000 mg | ORAL_TABLET | Freq: Four times a day (QID) | ORAL | 0 refills | Status: DC | PRN

## 2023-02-24 MED ORDER — CLOPIDOGREL BISULFATE 75 MG PO TABS: 75.0000 mg | ORAL_TABLET | Freq: Every day | ORAL | 0 refills | Status: DC

## 2023-02-24 NOTE — Telephone Encounter (Signed)
Patient is requesting a refill of the following medications: Requested Prescriptions   Pending Prescriptions Disp Refills   traMADol (ULTRAM) 50 MG tablet 40 tablet 0    Sig: Take 1-2 tablets (50-100 mg total) by mouth every 6 (six) hours as needed for moderate pain.    Date of last refill:02/17/2022  Refill amount: 40 tablets 0 refills  last medication not given from Mast, Man X, NP   Treatment agreement date: No treatment agreement given

## 2023-02-25 ENCOUNTER — Other Ambulatory Visit: Payer: Self-pay

## 2023-02-25 MED ORDER — TRAMADOL HCL 50 MG PO TABS: 50.0000 mg | ORAL_TABLET | Freq: Four times a day (QID) | ORAL | 0 refills | Status: DC | PRN

## 2023-02-25 NOTE — Telephone Encounter (Signed)
Refill request received fro [facility] pharmacy for Tramadol 50 mg tablet. Medication was sent in on yesterday,but it looks like it was sent to the wrong pharmacy.  Pharmacy updated and medication pended.  Medication has been sent to Man X Mast, NP

## 2023-03-05 ENCOUNTER — Ambulatory Visit
Admission: RE | Admit: 2023-03-05 | Discharge: 2023-03-05 | Disposition: A | Discharge status: Home / Self Care | Payer: Medicare Other | Source: Ambulatory Visit | Attending: Nurse Practitioner | Admitting: Nurse Practitioner

## 2023-03-06 ENCOUNTER — Other Ambulatory Visit: Payer: Self-pay | Admitting: Cardiovascular Disease

## 2023-03-08 ENCOUNTER — Other Ambulatory Visit: Payer: Self-pay | Admitting: Nurse Practitioner

## 2023-03-08 DIAGNOSIS — K8020 Calculus of gallbladder without cholecystitis without obstruction: Secondary | ICD-10-CM

## 2023-03-16 ENCOUNTER — Other Ambulatory Visit: Payer: Self-pay | Admitting: Nurse Practitioner

## 2023-03-16 NOTE — Telephone Encounter (Signed)
Allergy contraindications, High Risk Warnings, etc.

## 2023-03-17 ENCOUNTER — Encounter: Payer: Self-pay | Admitting: Gastroenterology

## 2023-03-17 ENCOUNTER — Ambulatory Visit (INDEPENDENT_AMBULATORY_CARE_PROVIDER_SITE_OTHER): Payer: Medicare Other | Admitting: Gastroenterology

## 2023-03-17 VITALS — HR 91 | Ht 65.0 in | Wt 188.0 lb

## 2023-03-17 DIAGNOSIS — R1030 Lower abdominal pain, unspecified: Secondary | ICD-10-CM

## 2023-03-17 DIAGNOSIS — K5900 Constipation, unspecified: Secondary | ICD-10-CM

## 2023-03-17 DIAGNOSIS — G629 Polyneuropathy, unspecified: Secondary | ICD-10-CM | POA: Diagnosis not present

## 2023-03-17 NOTE — Patient Instructions (Addendum)
Start taking Miralax 1 capful (17 grams) 1x / day for 1 week.   If this is not effective, increase to 1 dose 2x / day for 1 week.   If this is still not effective, increase to two capfuls (34 grams) 2x / day.   Can adjust dose as needed based on response. Can take 1/2 cap daily, skip days, or increase per day.    Benefiber/metamucil daily  Increase water to 60-100oz per day  Ibgard as needed for abdominal pain  It was a pleasure to see you today!  Thank you for trusting me with your gastrointestinal care!

## 2023-03-17 NOTE — Progress Notes (Signed)
Chief Complaint: Lower abdominal pain and cholelithiasis Primary GI MD: Gentry Fitz  HPI: 82 year old female history of CAD s/p CABG (on Plavix), colectomy with colostomy creation/Hartman (2017), presents for evaluation of cholelithiasis.  Patient has history of diverticulitis of sigmoid colon with perforation and abscess s/p colectomy/colostomy 05/2016  Dr. Romie Levee (05/07/16) - Exploratory Laparotomy, lysis of adhesions, evacuation of peritoneal abscess, colostomy creation/hartmann procedure.  Underwent colonoscopy in 2018 and then had reversal.  Seen by PCP recently.  CT abdomen 02/12/2022 showed cholelithiasis and mild gallbladder wall thickening.  LFTs 01/2023 normal.  Patient had been having RLQ pain that resolves after BM without associated nausea and vomiting.  Repeat ultrasound 03/05/2023 shows cholelithiasis without signs of acute cholecystitis.  Hepatic steatosis.  Patient states she has lower abdominal pain that improves after having a bowel movement.  She takes Colace daily for constipation.  Has 2 bowel movements per day that are often soft but she does have to strain occasionally.  She feels her neuropathy is becoming worse.  It started in her feet and she now feels it up into her hips and in her buttocks.  She notes in the mornings her neuropathy is worse and she has difficulty pushing with a bowel movement as a result.  Denies nausea/vomiting.  Denies weight loss.  Denies upper abdominal pain.  Denies rectal bleeding.  Of note, patient states she saw Dr. Corinda Gubler prior to 2017 and she states that he told her due to her previous hysterectomy he feels she should not undergo a colonoscopy due to such a torturous colon.  She was then seen by Dr. Loreta Ave who also advised she wait on getting a colonoscopy.  So her very first colonoscopy was in 2018 after her diverticular complication.  No family history of colon cancer.  No colon polyps.  PREVIOUS GI WORKUP   Colonoscopy 08/26/2016  by Dr. Romie Levee for screening: - Diverticulosis in the descending colon.  - The examination was otherwise normal.  - No specimens collected.  Past Medical History:  Diagnosis Date   Anemia     mild as per PCP   Anginal pain (HCC)    06/20/20, relieved    Arthritis    hip/ s/p recent shoulder fracture 8/12- RIGHT   Asthma    Coronary artery disease 05/30/2018   LAD PCI/DES   GI bleeding    Hiatal hernia    History of kidney stones    Last year had surgery   Humerus fracture    with wrist on right side   Hypertension    hypercholesterolemia/  EKG on chart with clearance and note 06/18/11  Mazzocchi   Pneumonia    Pre-diabetes     Past Surgical History:  Procedure Laterality Date   ABDOMINAL HYSTERECTOMY     COLECTOMY WITH COLOSTOMY CREATION/HARTMANN PROCEDURE N/A 05/07/2016   Procedure: COLOSTOMY CREATION/HARTMANN PROCEDURE;  Surgeon: Romie Levee, MD;  Location: [facility];  Service: General;  Laterality: N/A;   COLONOSCOPY N/A 08/26/2016   Procedure: COLONOSCOPY;  Surgeon: Romie Levee, MD;  Location: [facility];  Service: Endoscopy;  Laterality: N/A;   COLOSTOMY TAKEDOWN N/A 08/27/2016   Procedure: LAPAROSCOPIC COLOSTOMY REVERSAL;  Surgeon: Romie Levee, MD;  Location: [facility];  Service: General;  Laterality: N/A;   CORONARY ARTERY BYPASS GRAFT N/A 11/21/2018   Procedure: CORONARY ARTERY BYPASS GRAFTING (CABG) x Three , using left internal mammary artery and right leg greater saphenous vein harvested endoscopically;  Surgeon: Alleen Borne, MD;  Location: [facility];  Service: Open Heart Surgery;  Laterality: N/A;   CORONARY STENT INTERVENTION N/A 05/30/2018   Procedure: CORONARY STENT INTERVENTION;  Surgeon: Runell Gess, MD;  Location: [facility];  Service: Cardiovascular;  Laterality: N/A;   CYSTOSCOPY WITH RETROGRADE PYELOGRAM, URETEROSCOPY AND STENT PLACEMENT Bilateral 06/21/2020   Procedure: CYSTOSCOPY WITH RETROGRADE PYELOGRAM, AND BILATERAL STENT  PLACEMENT;  Surgeon: Sebastian Ache, MD;  Location: [facility];  Service: Urology;  Laterality: Bilateral;   CYSTOSCOPY WITH RETROGRADE PYELOGRAM, URETEROSCOPY AND STENT PLACEMENT Bilateral 07/12/2020   Procedure: CYSTOSCOPY WITH RETROGRADE PYELOGRAM, URETEROSCOPY AND STENT PLACEMENT;  Surgeon: Crist Fat, MD;  Location: [facility];  Service: Urology;  Laterality: Bilateral;  2 HRS   EYE SURGERY     eyelid tuck bilateral   HOLMIUM LASER APPLICATION Bilateral 07/12/2020   Procedure: HOLMIUM LASER APPLICATION;  Surgeon: Crist Fat, MD;  Location: [facility];  Service: Urology;  Laterality: Bilateral;   LAPAROTOMY N/A 05/07/2016   Procedure: EXPLORATORY LAPAROTOMY, LYLSIS OF ADHSIONS, Pam Drown OF PERITONEAL ABSCESS;  Surgeon: Romie Levee, MD;  Location: [facility];  Service: General;  Laterality: N/A;   LEFT HEART CATH AND CORONARY ANGIOGRAPHY N/A 05/30/2018   Procedure: LEFT HEART CATH AND CORONARY ANGIOGRAPHY;  Surgeon: Runell Gess, MD;  [facility];  Service: Cardiovascular;  Laterality: N/A;   LEFT HEART CATH AND CORONARY ANGIOGRAPHY N/A 11/17/2018   Procedure: LEFT HEART CATH AND CORONARY ANGIOGRAPHY;  Surgeon: Runell Gess, MD;  [facility];  Service: Cardiovascular;  Laterality: N/A;   PARATHYROIDECTOMY     one lobe- benign per pt   TEE WITHOUT CARDIOVERSION N/A 11/21/2018   Procedure: TRANSESOPHAGEAL ECHOCARDIOGRAM (TEE);  Surgeon: Alleen Borne, MD;  Location: [facility];  Service: Open Heart Surgery;  Laterality: N/A;   TONSILLECTOMY     TOTAL HIP ARTHROPLASTY  07/08/2011   Procedure: TOTAL HIP ARTHROPLASTY;  Surgeon: Gus Rankin Aluisio;  Location: [facility];  Service: Orthopedics;  Laterality: Right;   TOTAL KNEE ARTHROPLASTY Left 02/16/2022   Procedure: TOTAL KNEE ARTHROPLASTY;  Surgeon: Ollen Gross, MD;  Location: [facility];  Service: Orthopedics;  Laterality: Left;    Current Outpatient Medications  Medication Sig Dispense Refill    atorvastatin (LIPITOR) 80 MG tablet TAKE 1 TABLET(80 MG) BY MOUTH DAILY 30 tablet 10   budesonide-formoterol (SYMBICORT) 80-4.5 MCG/ACT inhaler Inhale 2 puffs into the lungs in the morning and at bedtime. Also uses as needed for emergency purposes     carvedilol (COREG) 12.5 MG tablet TAKE 1 TABLET BY MOUTH 2 TIMES A DAY **SCHEDULE APPOINTMENT FOR FUTURE REFILLS** 15 tablet 0   cetirizine (ZYRTEC) 10 MG tablet Take 10 mg by mouth at bedtime.     clopidogrel (PLAVIX) 75 MG tablet Take 1 tablet (75 mg total) by mouth daily. 15 tablet 0   famotidine (PEPCID AC) 10 MG tablet Take 10 mg by mouth daily as needed for heartburn or indigestion.     ipratropium (ATROVENT) 0.03 % nasal spray Place 2 sprays into both nostrils 3 (three) times daily as needed for rhinitis.     lisinopril (PRINIVIL,ZESTRIL) 40 MG tablet Take 40 mg by mouth daily.     Multiple Vitamins-Minerals (MULTIVITAMIN WOMEN 50+ PO) Take 1 tablet by mouth daily.     nitroGLYCERIN (NITROSTAT) 0.4 MG SL tablet Place 1 tablet (0.4 mg total) under the tongue as needed. 25 tablet 3   OVER THE COUNTER MEDICATION Apply 1 Application topically as needed (pain). Magne Sport Balm  pantoprazole (PROTONIX) 40 MG tablet Take 1 tablet (40 mg total) by mouth 2 (two) times daily before a meal. 30 tablet 0   pregabalin (LYRICA) 25 MG capsule TAKE 1 CAPSULE BY MOUTH 2 TIMES A DAY 60 capsule 0   Simethicone (PHAZYME ULTIMATE) 500 MG CAPS Take 500 mg by mouth daily as needed (bloating).     Soft Lens Products (BIOTRUE) SOLN Place 1 drop into both eyes as needed (dry eyes).     sucralfate (CARAFATE) 1 g tablet Take your Protonix at least 30 minutes prior to your first morning dose of Carafate. 40 tablet 0   traMADol (ULTRAM) 50 MG tablet Take 1 tablet (50 mg total) by mouth every 6 (six) hours as needed. 40 tablet 0   No current facility-administered medications for this visit.    Allergies as of 03/17/2023 - Review Complete 02/18/2023  Allergen Reaction  Noted   Morphine Other (See Comments) 06/19/2020   Penicillins Other (See Comments) 06/29/2011   Pregabalin Anxiety 07/02/2011   Pregabalin Swelling and Anxiety 07/02/2011    Family History  Problem Relation Age of Onset   CAD Father 66       PCI    Social History   Socioeconomic History   Marital status: Widowed    Spouse name: Not on file   Number of children: Not on file   Years of education: Not on file   Highest education level: Not on file  Occupational History   Not on file  Tobacco Use   Smoking status: Never   Smokeless tobacco: Never  Vaping Use   Vaping status: Never Used  Substance and Sexual Activity   Alcohol use: Yes    Comment: socially- 2 x year   Drug use: No   Sexual activity: Not on file    Comment: Hysterectomy  Other Topics Concern   Not on file  Social History Narrative   Not on file   Social Determinants of Health   Financial Resource Strain: Low Risk  (10/22/2022)   Received from [facility], [facility]   Overall Financial Resource Strain (CARDIA)    Difficulty of Paying Living Expenses: Not hard at all  Food Insecurity: No Food Insecurity (10/22/2022)   Received from [facility], [facility]   Hunger Vital Sign    Worried About Running Out of Food in the Last Year: Never true    Ran Out of Food in the Last Year: Never true  Transportation Needs: No Transportation Needs (10/22/2022)   Received from [facility], [facility]   PRAPARE - Transportation    Lack of Transportation (Medical): No    Lack of Transportation (Non-Medical): No  Physical Activity: Insufficiently Active (12/30/2021)   Received from [facility], [facility]   Exercise Vital Sign    Days of Exercise per Week: 1 day    Minutes of Exercise per Session: 30 min  Stress: Stress Concern Present (12/30/2021)   Received from [facility], [facility] - Occupational Stress Questionnaire    Feeling of Stress : To  some extent  Social Connections: Unknown (01/03/2023)   Received from [facility], [facility]   Social Network    Social Network: Not on file  Intimate Partner Violence: Unknown (01/03/2023)   Received from [facility], [facility]   HITS    Physically Hurt: Not on file    Insult or Talk Down To: Not on file    Threaten Physical Harm:  Not on file    Scream or Curse: Not on file    Review of Systems:    Constitutional: No weight loss, fever, chills, weakness or fatigue HEENT: Eyes: No change in vision               Ears, Nose, Throat:  No change in hearing or congestion Skin: No rash or itching Cardiovascular: No chest pain, chest pressure or palpitations   Respiratory: No SOB or cough Gastrointestinal: See HPI and otherwise negative Genitourinary: No dysuria or change in urinary frequency Neurological: No headache, dizziness or syncope Musculoskeletal: No new muscle or joint pain Hematologic: No bleeding or bruising Psychiatric: No history of depression or anxiety    Physical Exam:  Vital signs: There were no vitals taken for this visit.  Constitutional: NAD, Well developed, Well nourished, alert and cooperative.  Appears significantly younger than stated age Head:  Normocephalic and atraumatic. Eyes:   PEERL, EOMI. No icterus. Conjunctiva pink. Respiratory: Respirations even and unlabored. Lungs clear to auscultation bilaterally.   No wheezes, crackles, or rhonchi.  Cardiovascular:  Regular rate and rhythm. No peripheral edema, cyanosis or pallor.  Gastrointestinal:  Soft, nondistended, nontender. No rebound or guarding. Normal bowel sounds. No appreciable masses or hepatomegaly. Rectal:  Not performed.  Msk:  Symmetrical without gross deformities. Without edema, no deformity or joint abnormality.  Neurologic:  Alert and  oriented x4;  grossly normal neurologically.  Skin:   Dry and intact without significant lesions or rashes. Psychiatric: Oriented to person, place  and time. Demonstrates good judgement and reason without abnormal affect or behaviors.   RELEVANT LABS AND IMAGING: CBC    Component Value Date/Time   WBC 6.7 01/12/2023 0710   RBC 4.33 01/12/2023 0710   HGB 13.0 01/12/2023 0710   HGB 13.6 08/03/2019 1501   HCT 39.2 01/12/2023 0710   HCT 40.0 08/03/2019 1501   PLT 169 01/12/2023 0710   PLT 167 08/03/2019 1501   MCV 90.5 01/12/2023 0710   MCV 94 08/03/2019 1501   MCH 30.0 01/12/2023 0710   MCHC 33.2 01/12/2023 0710   RDW 13.1 01/12/2023 0710   RDW 12.2 08/03/2019 1501   LYMPHSABS 2,003 01/12/2023 0710   LYMPHSABS 2.1 08/20/2017 1218   MONOABS 0.5 05/04/2016 0714   EOSABS 168 01/12/2023 0710   EOSABS 0.2 08/20/2017 1218   BASOSABS 60 01/12/2023 0710   BASOSABS 0.1 08/20/2017 1218    CMP     Component Value Date/Time   NA 141 01/12/2023 0710   NA 142 12/15/2021 1108   K 4.0 01/12/2023 0710   CL 104 01/12/2023 0710   CO2 28 01/12/2023 0710   GLUCOSE 121 (H) 01/12/2023 0710   BUN 20 01/12/2023 0710   BUN 24 12/15/2021 1108   CREATININE 0.65 01/12/2023 0710   CALCIUM 9.5 01/12/2023 0710   PROT 6.6 01/12/2023 0710   PROT 6.5 12/15/2021 1108   ALBUMIN 4.7 02/11/2022 1631   ALBUMIN 4.6 12/15/2021 1108   AST 16 01/12/2023 0710   ALT 16 01/12/2023 0710   ALKPHOS 70 02/11/2022 1631   BILITOT 0.9 01/12/2023 0710   BILITOT 1.2 12/15/2021 1108   GFRNONAA >60 02/17/2022 0330   GFRAA 97 08/03/2019 1501     Assessment/Plan:   82 year old female history of diverticular abscess s/p Hartman's procedure 2017 with normal colonoscopy in 2018 and underwent takedown presents with lower abdominal pain and constipation with history of neuropathy  Lower abdominal pain Constipation, unspecified constipation type Neuropathy Suspect patient's lower abdominal  pain is not due to her history of cholelithiasis since it improves after bowel movement.  She has chronic constipation and is on Colace but notes her neuropathy is progressing and  makes it difficult for her to push during a bowel movement.  Recent colonoscopy in 2018 was normal.  Suspect worsening constipation secondary to progressing neuropathy. --- Start taking Miralax 1 capful (17 grams) 1x / day for 1 week.   If this is not effective, increase to 1 dose 2x / day for 1 week.   If this is still not effective, increase to two capfuls (34 grams) 2x / day.   Can adjust dose as needed based on response. Can take 1/2 cap daily, skip days, or increase per day.   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  If no improvement with management as above, could consider colonoscopy as patient appears to be in great health for her age.  However, patient would like to avoid procedures if possible.   Boone Master, PA-C [facility] 03/17/2023, 8:33 AM  Cc: Mast, Man X, NP

## 2023-03-21 NOTE — Progress Notes (Signed)
Agree with the assessment and plan as outlined by Boone Master, PA-C.

## 2023-03-23 ENCOUNTER — Encounter: Payer: Self-pay | Admitting: Cardiovascular Disease

## 2023-03-23 ENCOUNTER — Ambulatory Visit: Payer: Medicare Other | Attending: Cardiovascular Disease | Admitting: Cardiovascular Disease

## 2023-03-23 VITALS — BP 126/68 | HR 93 | Ht 65.0 in | Wt 190.8 lb

## 2023-03-23 DIAGNOSIS — I10 Essential (primary) hypertension: Secondary | ICD-10-CM | POA: Insufficient documentation

## 2023-03-23 DIAGNOSIS — Z951 Presence of aortocoronary bypass graft: Secondary | ICD-10-CM | POA: Diagnosis present

## 2023-03-23 DIAGNOSIS — Z9861 Coronary angioplasty status: Secondary | ICD-10-CM | POA: Insufficient documentation

## 2023-03-23 DIAGNOSIS — E785 Hyperlipidemia, unspecified: Secondary | ICD-10-CM | POA: Diagnosis present

## 2023-03-23 DIAGNOSIS — I2510 Atherosclerotic heart disease of native coronary artery without angina pectoris: Secondary | ICD-10-CM | POA: Insufficient documentation

## 2023-03-23 NOTE — Assessment & Plan Note (Signed)
History of essential hypertension a blood pressure measured today at 126/68.  She is on carvedilol and lisinopril.

## 2023-03-23 NOTE — Patient Instructions (Signed)
Medication Instructions:  No changes *If you need a refill on your cardiac medications before your next appointment, please call your pharmacy*  Follow-Up: At [facility], you and your health needs are our priority.  As part of our continuing mission to provide you with exceptional heart care, we have created designated Provider Care Teams.  These Care Teams include your primary Cardiologist (physician) and Advanced Practice Providers (APPs -  Physician Assistants and Nurse Practitioners) who all work together to provide you with the care you need, when you need it.  We recommend signing up for the patient portal called "MyChart".  Sign up information is provided on this After Visit Summary.  MyChart is used to connect with patients for Virtual Visits (Telemedicine).  Patients are able to view lab/test results, encounter notes, upcoming appointments, etc.  Non-urgent messages can be sent to your provider as well.   To learn more about what you can do with MyChart, go to [email or web address].    Your next appointment:    Azalee Course, PA  in 6 months  Dr Allyson Sabal in 1 year

## 2023-03-23 NOTE — Assessment & Plan Note (Addendum)
History of dyslipidemia on statin therapy with lipid profile performed 01/12/2023 revealing total cholesterol of 166, and LDL of 83.

## 2023-03-23 NOTE — Progress Notes (Signed)
03/23/2023 Monique [name]   11-20-1940  [id number]  Primary Physician Mast, Man X, NP Primary Cardiologist: Runell Gess MD Milagros Loll, [place], [place]  HPI:  Monique [name] is a 82 y.o.   moderately overweight widowed Caucasian female mother of 2, grandmother and 2 grandchildren's husband Monique [name] was a long-term patient of mine who died 08-09-16. 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 She was referred by [name] NP  for a symptomatic sinus tachycardia.  I last saw her in the office 10/08/2020.Marland Kitchen Her cardiac risk factors include treated hypertension and hyperlipidemia patient is a family history of heart disease with father who died of a myocardial function and age 11. She has never had a heart attack or stroke. She denies chest pain or shortness of breath. She does exercise and does yoga as well. She has reactive airways disease. She is noted to be tachycardic by her PCP was referred here for further evaluation.   When I saw her in the office 05/27/2018 she was complaining of increasing dyspnea and exertional chest pain.  I performed radial diagnostic cath 05/22/2018 revealing high-grade calcified proximal LAD stenosis.  I stented her with a 2.5 mm x 20 mm long Synergy drug-eluting stent however there was a small area that would not completely expand which left her with a fairly focal 50% stenosis within the dilated segment.  She also had an 80% small ramus branch stenosis which was untreated.  Her angina has resolved her dyspnea has improved.  She was significantly anemic with a hemoglobin of 8.1 during her hospitalization which is improved with iron repletion up to the low 9 range.   She had done well until earlier this month when she started developing crescendo angina.  She was seen by Dr. Jacques Navy  on 11/04/2018 who adjusted her medications and increased her long-acting oral nitrate.  She continues to have effort angina which is fairly reproducible as well as some  unstable symptoms at night.   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  She underwent CABG x3 by Dr. Laneta Simmers 11/21/2018 with a LIMA to the LAD, vein to an obtuse marginal branch and ramus branch.  Her postop course was uncomplicated.  She  recuperated nicely and  and participated in the cardiac rehab program.   Since I saw her in the office 2 years ago she continues to do well.  She did have a left total knee replacement by Dr. Bonnita Nasuti in July of last year which she is slowly recovering from.  She moved into her friend's home in January of this year.  She denies chest pain or shortness of breath.   Current Meds  Medication Sig   atorvastatin (LIPITOR) 80 MG tablet TAKE 1 TABLET(80 MG) BY MOUTH DAILY   budesonide-formoterol (SYMBICORT) 80-4.5 MCG/ACT inhaler Inhale 2 puffs into the lungs in the morning and at bedtime. Also uses as needed for emergency purposes   carvedilol (COREG) 12.5 MG tablet TAKE 1 TABLET BY MOUTH 2 TIMES A DAY **SCHEDULE APPOINTMENT FOR FUTURE REFILLS**   cetirizine (ZYRTEC) 10 MG tablet Take 10 mg by mouth at bedtime.   clopidogrel (PLAVIX) 75 MG tablet Take 1 tablet (75 mg total) by mouth daily.   famotidine (PEPCID AC) 10 MG tablet Take 10 mg by mouth daily as needed for heartburn or indigestion.   ipratropium (ATROVENT) 0.03 % nasal spray Place 2 sprays into both nostrils  3 (three) times daily as needed for rhinitis.   lisinopril (PRINIVIL,ZESTRIL) 40 MG tablet Take 40 mg by mouth daily.   Multiple Vitamins-Minerals (MULTIVITAMIN WOMEN 50+ PO) Take 1 tablet by mouth daily.   nitroGLYCERIN (NITROSTAT) 0.4 MG SL tablet Place 1 tablet (0.4 mg total) under the tongue as needed.   OVER THE COUNTER MEDICATION Apply 1 Application topically as needed (pain). Magne Sport Balm   pantoprazole (PROTONIX) 40 MG tablet Take 1 tablet (40 mg total) by mouth 2 (two) times daily before a meal.    pregabalin (LYRICA) 25 MG capsule TAKE 1 CAPSULE BY MOUTH 2 TIMES A DAY   Simethicone (PHAZYME ULTIMATE) 500 MG CAPS Take 500 mg by mouth daily as needed (bloating).   Soft Lens Products (BIOTRUE) SOLN Place 1 drop into both eyes as needed (dry eyes).   sucralfate (CARAFATE) 1 g tablet Take your Protonix at least 30 minutes prior to your first morning dose of Carafate.   traMADol (ULTRAM) 50 MG tablet Take 1 tablet (50 mg total) by mouth every 6 (six) hours as needed.     Allergies  Allergen Reactions   Morphine Other (See Comments)    Medication did not work for pain    Penicillins Other (See Comments)    UNSPECIFIED REACTION  Unknown reaction as a child.  Tolerated Zosyn 2017. Tolerated Cephalosporin Date: 02/17/22.     Pregabalin Anxiety    Lyrica    Pregabalin Swelling and Anxiety    Lyrica   anxiety    Social History   Socioeconomic History   Marital status: Widowed    Spouse name: Not on file   Number of children: 2   Years of education: Not on file   Highest education level: Not on file  Occupational History   Occupation: retired  Tobacco Use   Smoking status: Never   Smokeless tobacco: Never  Vaping Use   Vaping status: Never Used  Substance and Sexual Activity   Alcohol use: Yes    Comment: socially- 2 x year   Drug use: No   Sexual activity: Not on file    Comment: Hysterectomy  Other Topics Concern   Not on file  Social History Narrative   Not on file   Social Determinants of Health   Financial Resource Strain: Low Risk  (10/22/2022)   Received from [facility], [facility]   Overall Financial Resource Strain (CARDIA)    Difficulty of Paying Living Expenses: Not hard at all  Food Insecurity: No Food Insecurity (10/22/2022)   Received from [facility], [facility]   Hunger Vital Sign    Worried About Running Out of Food in the Last Year: Never true    Ran Out of Food in the Last Year: Never true  Transportation Needs: No Transportation  Needs (10/22/2022)   Received from [facility], [facility]   PRAPARE - Transportation    Lack of Transportation (Medical): No    Lack of Transportation (Non-Medical): No  Physical Activity: Insufficiently Active (12/30/2021)   Received from [facility], [facility]   Exercise Vital Sign    Days of Exercise per Week: 1 day    Minutes of Exercise per Session: 30 min  Stress: Stress Concern Present (12/30/2021)   Received from [facility], [facility] - Occupational Stress Questionnaire    Feeling of Stress : To some extent  Social Connections: Unknown (01/03/2023)   Received from [facility], [facility]  Social Network    Social Network: Not on file  Intimate Partner Violence: Unknown (01/03/2023)   Received from [facility], [facility]   HITS    Physically Hurt: Not on file    Insult or Talk Down To: Not on file    Threaten Physical Harm: Not on file    Scream or Curse: Not on file     Review of Systems: General: negative for chills, fever, night sweats or weight changes.  Cardiovascular: negative for chest pain, dyspnea on exertion, edema, orthopnea, palpitations, paroxysmal nocturnal dyspnea or shortness of breath Dermatological: negative for rash Respiratory: negative for cough or wheezing Urologic: negative for hematuria Abdominal: negative for nausea, vomiting, diarrhea, bright red blood per rectum, melena, or hematemesis Neurologic: negative for visual changes, syncope, or dizziness All other systems reviewed and are otherwise negative except as noted above.    Blood pressure 126/68, pulse 93, height 5\' 5"  (1.651 m), weight 190 lb 12.8 oz (86.5 kg), SpO2 94%.  General appearance: alert and no distress Neck: no adenopathy, no carotid bruit, no JVD, supple, symmetrical, trachea midline, and thyroid not enlarged, symmetric, no tenderness/mass/nodules Lungs: clear to auscultation bilaterally Heart: Regular rate  and rhythm without murmurs gallops rubs or clicks Extremities: extremities normal, atraumatic, no cyanosis or edema Pulses: 2+ and symmetric Skin: Skin color, texture, turgor normal. No rashes or lesions Neurologic: Grossly normal  EKG EKG Interpretation Date/Time:  Tuesday March 23 2023 12:05:57 EDT Ventricular Rate:  92 PR Interval:  164 QRS Duration:  86 QT Interval:  346 QTC Calculation: 427 R Axis:   38  Text Interpretation: Normal sinus rhythm Nonspecific ST and T wave abnormality When compared with ECG of 17-Feb-2022 00:14, PR interval has decreased ST less depressed in Lateral leads Confirmed by [name] [id number]) on 03/23/2023 12:28:58 PM    ASSESSMENT AND PLAN:   Essential hypertension History of essential hypertension a blood pressure measured today at 126/68.  She is on carvedilol and lisinopril.  Dyslipidemia, goal LDL below 70 History of dyslipidemia on statin therapy with lipid profile performed 01/12/2023 revealing total cholesterol of 166, and LDL of 83.  S/P CABG (coronary artery bypass graft) History of CAD status post diagnostic cath by myself 05/22/2018 revealing a high-grade calcified proximal LAD stenosis.  I stented her with a 2.5 mm x 20 mm long Synergy drug-eluting stent however this was unable to be completely expanded.  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  She ultimately underwent CABG x 3 by Dr. Laneta Simmers 11/21/2018 with a LIMA to her LAD, vein to an obtuse marginal branch and ramus branch.  She recuperated nicely and is completely asymptomatic.     Runell Gess MD FACP,FACC,FAHA, [facility] 03/23/2023 12:40 PM

## 2023-03-23 NOTE — Assessment & Plan Note (Signed)
History of CAD status post diagnostic cath by myself 05/22/2018 revealing a high-grade calcified proximal LAD stenosis.  I stented her with a 2.5 mm x 20 mm long Synergy drug-eluting stent however this was unable to be completely expanded.  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  She ultimately underwent CABG x 3 by Dr. Laneta Simmers 11/21/2018 with a LIMA to her LAD, vein to an obtuse marginal branch and ramus branch.  She recuperated nicely and is completely asymptomatic.

## 2023-03-24 ENCOUNTER — Other Ambulatory Visit: Payer: Self-pay | Admitting: Cardiovascular Disease

## 2023-03-25 ENCOUNTER — Telehealth: Payer: Self-pay | Admitting: Cardiovascular Disease

## 2023-03-25 MED ORDER — CARVEDILOL 12.5 MG PO TABS: ORAL_TABLET | ORAL | 3 refills | Status: DC

## 2023-03-25 NOTE — Telephone Encounter (Signed)
Carvedilol refill sent in to the patient's pharmacy.

## 2023-03-25 NOTE — Telephone Encounter (Signed)
*  STAT* If patient is at the pharmacy, call can be transferred to refill team.   1. Which medications need to be refilled? (please list name of each medication and dose if known)   carvedilol (COREG) 12.5 MG tablet      4. Which pharmacy/location (including street and city if local pharmacy) is medication to be sent to? [facility] [id number] - [place], [place] - [address]     5. Do they need a 30 day or 90 day supply? 90   Please send 90 day supply, this pt was seen 03/23/23.

## 2023-03-29 ENCOUNTER — Other Ambulatory Visit: Payer: Self-pay | Admitting: Nurse Practitioner

## 2023-03-29 NOTE — Telephone Encounter (Signed)
Pharmacy requested refill.  Epic LR: 03/16/23 No Contract signed. Note placed on upcoming visit to have contract signed.  Pended Rx and sent to [facility] for approval.

## 2023-04-08 ENCOUNTER — Non-Acute Institutional Stay: Payer: Medicare Other | Admitting: Nurse Practitioner

## 2023-04-08 ENCOUNTER — Encounter: Payer: Self-pay | Admitting: Nurse Practitioner

## 2023-04-08 VITALS — BP 135/78 | HR 97 | Temp 97.5°F | Resp 18 | Ht 65.0 in | Wt 192.0 lb

## 2023-04-08 DIAGNOSIS — R7989 Other specified abnormal findings of blood chemistry: Secondary | ICD-10-CM | POA: Diagnosis not present

## 2023-04-08 DIAGNOSIS — I10 Essential (primary) hypertension: Secondary | ICD-10-CM | POA: Diagnosis not present

## 2023-04-08 DIAGNOSIS — G629 Polyneuropathy, unspecified: Secondary | ICD-10-CM

## 2023-04-08 DIAGNOSIS — E039 Hypothyroidism, unspecified: Secondary | ICD-10-CM

## 2023-04-08 DIAGNOSIS — Z9861 Coronary angioplasty status: Secondary | ICD-10-CM

## 2023-04-08 DIAGNOSIS — E1159 Type 2 diabetes mellitus with other circulatory complications: Secondary | ICD-10-CM | POA: Diagnosis not present

## 2023-04-08 DIAGNOSIS — E785 Hyperlipidemia, unspecified: Secondary | ICD-10-CM

## 2023-04-08 DIAGNOSIS — I2510 Atherosclerotic heart disease of native coronary artery without angina pectoris: Secondary | ICD-10-CM

## 2023-04-08 DIAGNOSIS — M170 Bilateral primary osteoarthritis of knee: Secondary | ICD-10-CM

## 2023-04-08 DIAGNOSIS — F419 Anxiety disorder, unspecified: Secondary | ICD-10-CM

## 2023-04-08 MED ORDER — PREGABALIN 25 MG PO CAPS: 25.0000 mg | ORAL_CAPSULE | Freq: Every day | ORAL | 0 refills | Status: DC

## 2023-04-08 MED ORDER — PREGABALIN 50 MG PO CAPS: 50.0000 mg | ORAL_CAPSULE | Freq: Every day | ORAL | 0 refills | Status: DC

## 2023-04-08 NOTE — Assessment & Plan Note (Signed)
Taking Duloxine already.

## 2023-04-08 NOTE — Assessment & Plan Note (Signed)
taking Atorvastatin, LDL 83 01/12/23

## 2023-04-08 NOTE — Assessment & Plan Note (Addendum)
A1c 6.4 01/12/23, 6.4 01/19/23, 6.8 04/06/23, continue diet control. Repeat Hgb A1c 10/19/23

## 2023-04-08 NOTE — Assessment & Plan Note (Signed)
taking Atorvastatin, LDL 83 01/12/23, repeat lipid panel 10/19/23

## 2023-04-08 NOTE — Assessment & Plan Note (Addendum)
Blood pressure is controlled, taking Carvedilol, Lisinopril. Bun/creat 20/0.65 01/12/23, update CMP/eGFR 6 months

## 2023-04-08 NOTE — Assessment & Plan Note (Signed)
s/p CABG x3 11/21/18, (prior stent drug eluting), hx of tachycardia, on oral nitrate, Plavix, Carvedilol, Lisinopril, followed by cardiology.

## 2023-04-08 NOTE — Assessment & Plan Note (Addendum)
off Gabapentin, better since placed on Lyrica per patient's request.

## 2023-04-08 NOTE — Assessment & Plan Note (Signed)
Elevated TSH 6.27 01/12/23>>4.64 01/19/23<<5.29 04/06/23, repeat TSH 6 months.

## 2023-04-08 NOTE — Progress Notes (Signed)
Location:   Clinic [facility]   Place of Service:  Clinic (12) Provider: Chipper Oman NP  Code Status: DNR Goals of Care:     04/08/2023   10:54 AM  Advanced Directives  Does Patient Have a Medical Advance Directive? Yes  Type of Estate agent of [place];Living will  Does patient want to make changes to medical advance directive? No - Patient declined  Copy of Healthcare Power of Attorney in Chart? Yes - validated most recent copy scanned in chart (See row information)     Chief Complaint  Patient presents with  . Follow-up    3 month F/U  . Immunizations    Shingrix, Pneumonia, Influenza and Covid vaccine.  . Quality Metric Gaps    Dexa Scan and Medicare Annual Wellness Visit    HPI: Patient is a 82 y.o. female seen today for medical management of chronic diseases.      02/12/22 CT abd Cholelithiasis with mild gallbladder wall thickening, LFT wnl 01/12/23. C/o RLQ pain resolves after BM, negative Murphy's sign, denied abd pain associated with nausea, vomiting, she is afebrile.              HTN, taking Carvedilol, Lisinopril. Bun/creat 20/0.65 01/12/23             T2DM, A1c 6.4 01/12/23, 6.4 01/19/23, 6.8 04/06/23             Elevated TSH 6.27 01/12/23>>4.64 01/19/23<<5.29 04/06/23             HLD, taking Atorvastatin, LDL 83 01/12/23             Neuropathy, off Gabapentin, placed on Lyrica per patient's request.              Hx of UTI, s/p kidney stones surgical removal.              OA, s/p L knee replacement, R hip, L shoulder pain-limited ROM, s/p Ortho, no further surgery, on Tramadol. 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              GERD, on Famotidine, Pantoprazole, Sucralfate, Hgb 13 01/12/23             Asthma, Symbicort, Atrovent nasal spray    Past Medical History:  Diagnosis Date  . Anemia      mild as per PCP  . Anginal pain (HCC)    06/20/20, relieved   . Arthritis    hip/ s/p recent shoulder fracture 8/12- RIGHT  . Asthma   . Coronary artery disease 05/30/2018   LAD PCI/DES  . GI bleeding   . Hiatal hernia   . History of kidney stones    Last year had surgery  . Humerus fracture    with wrist on right side  . Hypertension    hypercholesterolemia/  EKG on chart with clearance and note 06/18/11  Mazzocchi  . Pneumonia   . Pre-diabetes     Past Surgical History:  Procedure Laterality Date  . ABDOMINAL HYSTERECTOMY    . COLECTOMY WITH COLOSTOMY CREATION/HARTMANN PROCEDURE N/A 05/07/2016   Procedure: COLOSTOMY CREATION/HARTMANN PROCEDURE;  Surgeon: Romie Levee, MD;  Location: [facility];  Service: General;  Laterality: N/A;  .  COLONOSCOPY N/A 08/26/2016   Procedure: COLONOSCOPY;  Surgeon: Romie Levee, MD;  Location: [facility];  Service: Endoscopy;  Laterality: N/A;  . COLOSTOMY TAKEDOWN N/A 08/27/2016   Procedure: LAPAROSCOPIC COLOSTOMY REVERSAL;  Surgeon: Romie Levee, MD;  Location: [facility];  Service: General;  Laterality: N/A;  . CORONARY ARTERY BYPASS GRAFT N/A 11/21/2018   Procedure: CORONARY ARTERY BYPASS GRAFTING (CABG) x Three , using left internal mammary artery and right leg greater saphenous vein harvested endoscopically;  Surgeon: Alleen Borne, MD;  Location: [facility];  Service: Open Heart Surgery;  Laterality: N/A;  . CORONARY STENT INTERVENTION N/A 05/30/2018   Procedure: CORONARY STENT INTERVENTION;  Surgeon: Runell Gess, MD;  Location: [facility];  Service: Cardiovascular;  Laterality: N/A;  . CYSTOSCOPY WITH RETROGRADE PYELOGRAM, URETEROSCOPY AND STENT PLACEMENT Bilateral 06/21/2020   Procedure: CYSTOSCOPY WITH RETROGRADE PYELOGRAM, AND BILATERAL STENT PLACEMENT;  Surgeon: Sebastian Ache, MD;  Location: [facility];  Service: Urology;  Laterality: Bilateral;  . CYSTOSCOPY WITH RETROGRADE PYELOGRAM, URETEROSCOPY AND STENT PLACEMENT Bilateral  07/12/2020   Procedure: CYSTOSCOPY WITH RETROGRADE PYELOGRAM, URETEROSCOPY AND STENT PLACEMENT;  Surgeon: Crist Fat, MD;  Location: [facility];  Service: Urology;  Laterality: Bilateral;  2 HRS  . EYE SURGERY     eyelid tuck bilateral  . HOLMIUM LASER APPLICATION Bilateral 07/12/2020   Procedure: HOLMIUM LASER APPLICATION;  Surgeon: Crist Fat, MD;  Location: [facility];  Service: Urology;  Laterality: Bilateral;  . LAPAROTOMY N/A 05/07/2016   Procedure: EXPLORATORY LAPAROTOMY, LYLSIS OF ADHSIONS, Pam Drown OF PERITONEAL ABSCESS;  Surgeon: Romie Levee, MD;  Location: [facility];  Service: General;  Laterality: N/A;  . LEFT HEART CATH AND CORONARY ANGIOGRAPHY N/A 05/30/2018   Procedure: LEFT HEART CATH AND CORONARY ANGIOGRAPHY;  Surgeon: Runell Gess, MD;  [facility];  Service: Cardiovascular;  Laterality: N/A;  . LEFT HEART CATH AND CORONARY ANGIOGRAPHY N/A 11/17/2018   Procedure: LEFT HEART CATH AND CORONARY ANGIOGRAPHY;  Surgeon: Runell Gess, MD;  [facility];  Service: Cardiovascular;  Laterality: N/A;  . PARATHYROIDECTOMY     one lobe- benign per pt  . TEE WITHOUT CARDIOVERSION N/A 11/21/2018   Procedure: TRANSESOPHAGEAL ECHOCARDIOGRAM (TEE);  Surgeon: Alleen Borne, MD;  Location: [facility];  Service: Open Heart Surgery;  Laterality: N/A;  . TONSILLECTOMY    . TOTAL HIP ARTHROPLASTY  07/08/2011   Procedure: TOTAL HIP ARTHROPLASTY;  Surgeon: Gus Rankin Aluisio;  Location: [facility];  Service: Orthopedics;  Laterality: Right;  . TOTAL KNEE ARTHROPLASTY Left 02/16/2022   Procedure: TOTAL KNEE ARTHROPLASTY;  Surgeon: Ollen Gross, MD;  Location: [facility];  Service: Orthopedics;  Laterality: Left;    Allergies  Allergen Reactions  . Morphine Other (See Comments)    Medication did not work for pain   . Penicillins Other (See Comments)    UNSPECIFIED REACTION  Unknown reaction as a child.  Tolerated Zosyn 2017. Tolerated Cephalosporin Date:  02/17/22.    . Pregabalin Anxiety    Lyrica   . Pregabalin Swelling and Anxiety    Lyrica   anxiety    Allergies as of 04/08/2023       Reactions   Morphine Other (See Comments)   Medication did not work for pain    Penicillins Other (See Comments)   UNSPECIFIED REACTION  Unknown reaction as a child.  Tolerated Zosyn 2017. Tolerated Cephalosporin Date: 02/17/22.   Pregabalin Anxiety   Lyrica    Pregabalin Swelling, Anxiety  Lyrica  anxiety        Medication List        Accurate as of April 08, 2023 11:59 PM. If you have any questions, ask your nurse or doctor.          atorvastatin 80 MG tablet Commonly known as: LIPITOR TAKE 1 TABLET(80 MG) BY MOUTH DAILY   Biotrue Soln Place 1 drop into both eyes as needed (dry eyes).   budesonide-formoterol 80-4.5 MCG/ACT inhaler Commonly known as: SYMBICORT Inhale 2 puffs into the lungs in the morning and at bedtime. Also uses as needed for emergency purposes   carvedilol 12.5 MG tablet Commonly known as: COREG TAKE 1 TABLET BY MOUTH 2 TIMES A DAY   cetirizine 10 MG tablet Commonly known as: ZYRTEC Take 10 mg by mouth at bedtime.   clopidogrel 75 MG tablet Commonly known as: PLAVIX Take 1 tablet (75 mg total) by mouth daily.   ipratropium 0.03 % nasal spray Commonly known as: ATROVENT Place 2 sprays into both nostrils 3 (three) times daily as needed for rhinitis.   lisinopril 40 MG tablet Commonly known as: ZESTRIL Take 40 mg by mouth daily.   MULTIVITAMIN WOMEN 50+ PO Take 1 tablet by mouth daily.   nitroGLYCERIN 0.4 MG SL tablet Commonly known as: NITROSTAT Place 1 tablet (0.4 mg total) under the tongue as needed.   OVER THE COUNTER MEDICATION Apply 1 Application topically as needed (pain). Magne Sport Balm   pantoprazole 40 MG tablet Commonly known as: PROTONIX Take 1 tablet (40 mg total) by mouth 2 (two) times daily before a meal.   Pepcid AC 10 MG tablet Generic drug: famotidine Take 10  mg by mouth daily as needed for heartburn or indigestion.   Phazyme Ultimate 500 MG Caps Generic drug: Simethicone Take 500 mg by mouth daily as needed (bloating).   pregabalin 25 MG capsule Commonly known as: Lyrica Take 1 capsule (25 mg total) by mouth daily. What changed: when to take this Changed by: Laken Lobato X Verlyn Lambert   pregabalin 50 MG capsule Commonly known as: Lyrica Take 1 capsule (50 mg total) by mouth at bedtime. What changed: You were already taking a medication with the same name, and this prescription was added. Make sure you understand how and when to take each. Changed by: Shahzad Thomann X Donyale Berthold   sucralfate 1 g tablet Commonly known as: Carafate Take your Protonix at least 30 minutes prior to your first morning dose of Carafate.   traMADol 50 MG tablet Commonly known as: ULTRAM TAKE 1 TABLET BY MOUTH EVERY 6 HOURS AS NEEDED        Review of Systems:  Review of Systems  Constitutional:  Negative for appetite change, fatigue and unexpected weight change.  HENT:  Negative for congestion.   Eyes:  Negative for visual disturbance.  Respiratory:  Negative for cough, chest tightness, shortness of breath and wheezing.   Cardiovascular:  Negative for chest pain, palpitations and leg swelling.  Gastrointestinal:  Negative for abdominal pain, constipation, nausea and vomiting.       Hx of colon abscess  Genitourinary:  Negative for dysuria, frequency and urgency.       Bathroom trip x1/night, s/p hysterectomy, hx of kidney stones.   Musculoskeletal:  Positive for arthralgias. Negative for gait problem.       Left knee pain, ortho scar tissue, surgical removal-pending pt's decision, recommended PT for now  Skin:  Negative for color change.  Neurological:  Positive for numbness. Negative for  dizziness, speech difficulty, weakness and headaches.       Tingling, numbness   Psychiatric/Behavioral:  Negative for behavioral problems and sleep disturbance. The patient is nervous/anxious.      Health Maintenance  Topic Date Due  . FOOT EXAM  Never done  . OPHTHALMOLOGY EXAM  Never done  . Diabetic kidney evaluation - Urine ACR  Never done  . Zoster Vaccines- Shingrix (1 of 2) Never done  . DEXA SCAN  Never done  . Pneumonia Vaccine 74+ Years old (2 of 2 - PCV) 09/17/2022  . Medicare Annual Wellness (AWV)  01/01/2023  . INFLUENZA VACCINE  Never done  . COVID-19 Vaccine (9 - 2023-24 season) 04/04/2023  . HEMOGLOBIN A1C  10/04/2023  . Diabetic kidney evaluation - eGFR measurement  01/12/2024  . DTaP/Tdap/Td (2 - Td or Tdap) 02/22/2024  . HPV VACCINES  Aged Out    Physical Exam: Vitals:   04/08/23 1055  BP: 135/78  Pulse: 97  Resp: 18  Temp: (!) 97.5 F (36.4 C)  SpO2: 97%  Weight: 192 lb (87.1 kg)  Height: 5\' 5"  (1.651 m)   Body mass index is 31.95 kg/m. Physical Exam Vitals and nursing note reviewed.  Constitutional:      Appearance: Normal appearance.  HENT:     Head: Normocephalic and atraumatic.     Nose: Nose normal.     Mouth/Throat:     Mouth: Mucous membranes are moist.  Eyes:     Extraocular Movements: Extraocular movements intact.     Conjunctiva/sclera: Conjunctivae normal.     Pupils: Pupils are equal, round, and reactive to light.  Cardiovascular:     Rate and Rhythm: Normal rate and regular rhythm.     Heart sounds: No murmur heard. Pulmonary:     Effort: Pulmonary effort is normal.     Breath sounds: No rales.  Abdominal:     General: Bowel sounds are normal.     Palpations: Abdomen is soft.     Tenderness: There is no abdominal tenderness.  Musculoskeletal:        General: Tenderness present.     Cervical back: Normal range of motion and neck supple.     Right lower leg: No edema.     Left lower leg: No edema.     Comments: Pain on the top of left knee, s/p replacement, slightly warmer than the right. S/p left hip replacement. R shoulder limited ROM-opted out surgical intervention. C/o pain lateral left thigh.   Skin:     General: Skin is warm and dry.  Neurological:     General: No focal deficit present.     Mental Status: She is alert and oriented to person, place, and time. Mental status is at baseline.     Gait: Gait normal.  Psychiatric:        Mood and Affect: Mood normal.        Behavior: Behavior normal.        Thought Content: Thought content normal.        Judgment: Judgment normal.    Labs reviewed: Basic Metabolic Panel: Recent Labs    01/12/23 0710 01/19/23 0734 04/06/23 0729  NA 141  --   --   K 4.0  --   --   CL 104  --   --   CO2 28  --   --   GLUCOSE 121*  --   --   BUN 20  --   --   CREATININE  0.65  --   --   CALCIUM 9.5  --   --   TSH 6.27* 4.64* 5.29*   Liver Function Tests: Recent Labs    01/12/23 0710  AST 16  ALT 16  BILITOT 0.9  PROT 6.6   No results for input(s): "LIPASE", "AMYLASE" in the last 8760 hours. No results for input(s): "AMMONIA" in the last 8760 hours. CBC: Recent Labs    01/12/23 0710  WBC 6.7  NEUTROABS 3,926  HGB 13.0  HCT 39.2  MCV 90.5  PLT 169   Lipid Panel: Recent Labs    01/12/23 0710  CHOL 166  HDL 53  LDLCALC 83  TRIG 201*  CHOLHDL 3.1   Lab Results  Component Value Date   HGBA1C 6.8 (H) 04/06/2023    Procedures since last visit: No results found.  Assessment/Plan  Type 2 diabetes mellitus with cardiac complication (HCC)  A1c 6.4 01/12/23, 6.4 01/19/23, 6.8 04/06/23, continue diet control. Repeat Hgb A1c 10/19/23  Essential hypertension Blood pressure is controlled, taking Carvedilol, Lisinopril. Bun/creat 20/0.65 01/12/23, update CMP/eGFR 6 months  Elevated TSH taking Atorvastatin, LDL 83 01/12/23  Hypothyroidism Elevated TSH 6.27 01/12/23>>4.64 01/19/23<<5.29 04/06/23, repeat TSH 6 months.   Dyslipidemia, goal LDL below 70  taking Atorvastatin, LDL 83 01/12/23, repeat lipid panel 10/19/23  Neuropathy off Gabapentin, better since placed on Lyrica per patient's request.   OA (osteoarthritis) of knee s/p L knee  replacement, R hip, L shoulder pain-limited ROM, s/p Ortho, no further surgery, on Tramadol. Vit D 50 01/12/23  CAD S/P percutaneous coronary angioplasty s/p CABG x3 11/21/18, (prior stent drug eluting), hx of tachycardia, on oral nitrate, Plavix, Carvedilol, Lisinopril, followed by cardiology.   GERD (gastroesophageal reflux disease) on Famotidine, Pantoprazole, Sucralfate, Hgb 13 01/12/23, repeat CBC/diff 6 months.   Anxiety Taking Duloxine already.    Labs/tests ordered:  CBC/diff, CMP/eGFR, TSH, lipid panel, TSH, Hgb a1c  Next appt:  3 months.

## 2023-04-08 NOTE — Assessment & Plan Note (Signed)
s/p L knee replacement, R hip, L shoulder pain-limited ROM, s/p Ortho, no further surgery, on Tramadol. Vit D 50 01/12/23

## 2023-04-08 NOTE — Assessment & Plan Note (Signed)
on Famotidine, Pantoprazole, Sucralfate, Hgb 13 01/12/23, repeat CBC/diff 6 months.

## 2023-04-10 ENCOUNTER — Other Ambulatory Visit: Payer: Self-pay | Admitting: Nurse Practitioner

## 2023-04-12 ENCOUNTER — Encounter: Payer: Self-pay | Admitting: Nurse Practitioner

## 2023-04-12 NOTE — Telephone Encounter (Signed)
Patient has request refill on medication Tramadol 50mg . Patient medication has allergy contraindications and warnings. Medication last refilled 03/30/2023.

## 2023-04-13 ENCOUNTER — Telehealth: Payer: Self-pay

## 2023-04-13 NOTE — Telephone Encounter (Signed)
Below is ManXie's Response:   Mast, Man X, NP  You29 minutes ago (4:27 PM)    Recommend to take Lyrica 25mg  every day to observe her symptoms.  Thank you  Response shared with patient and she verbalized understanding. Medication list updated to reflect providers response

## 2023-04-13 NOTE — Telephone Encounter (Signed)
Patient states she is taking Lyrica 50 mg once daily (usually in the morning) and recently she has this buzzing noise in her head and overall does not feel right. Last time patient took medication was about 1 hour ago, and mentioned symptoms onset.  Patient states she was previously taking Lyrica 25 mg. I informed patient both doses are on her active medications list and rx's for both were sent to [facility] last week at her last OV with [facility]. Patient states it was her understanding to only take the 50 mg once daily.

## 2023-04-15 ENCOUNTER — Encounter: Payer: Self-pay | Admitting: Nurse Practitioner

## 2023-04-19 ENCOUNTER — Telehealth: Payer: Self-pay | Admitting: *Deleted

## 2023-04-19 NOTE — Telephone Encounter (Signed)
Patient notified and agreed.  Scheduled an appointment for 04/22/2023 with [facility].

## 2023-04-19 NOTE — Telephone Encounter (Signed)
Patient calling with Concerns:  1) Patient stated that she has been depressed since stopping the Duloxetine. Stated that she needs something for Depression. Stated she is not happy.   2)Patient wanted to let you know (FYI) that she went from 50mg  of Pregabalin to 25mg  and went back to 50mg  and she doesn't have the buzzing in the Head anymore. Stated that she is now taking Pregabalin 50mg    Please Advise.

## 2023-04-19 NOTE — Telephone Encounter (Signed)
Mast, Man X, NP  You1 hour ago (3:08 PM)    I would like to see her to address her depression.  Thanks.

## 2023-04-20 ENCOUNTER — Telehealth: Payer: Self-pay

## 2023-04-20 NOTE — Telephone Encounter (Signed)
Patient called stating she needs to cancel appointment for Thursday due to other commitments. Appointment canceled and rescheduled with Dr.Veludandi for Friday

## 2023-04-22 ENCOUNTER — Encounter: Payer: Medicare Other | Admitting: Nurse Practitioner

## 2023-04-23 ENCOUNTER — Encounter: Payer: Self-pay | Admitting: Sports Medicine

## 2023-04-23 ENCOUNTER — Non-Acute Institutional Stay: Payer: Medicare Other | Admitting: Sports Medicine

## 2023-04-23 VITALS — BP 135/88 | HR 80 | Temp 97.7°F | Resp 18 | Ht 65.0 in | Wt 188.8 lb

## 2023-04-23 DIAGNOSIS — E039 Hypothyroidism, unspecified: Secondary | ICD-10-CM | POA: Diagnosis not present

## 2023-04-23 DIAGNOSIS — F321 Major depressive disorder, single episode, moderate: Secondary | ICD-10-CM | POA: Diagnosis not present

## 2023-04-23 DIAGNOSIS — M1712 Unilateral primary osteoarthritis, left knee: Secondary | ICD-10-CM

## 2023-04-23 MED ORDER — LEVOTHYROXINE SODIUM 25 MCG PO TABS
25.0000 ug | ORAL_TABLET | Freq: Every day | ORAL | 3 refills | Status: DC
Start: 2023-04-23 — End: 2024-04-17

## 2023-04-23 MED ORDER — DULOXETINE HCL 30 MG PO CPEP: 90.0000 mg | ORAL_CAPSULE | Freq: Every day | ORAL | 3 refills | Status: DC

## 2023-04-23 NOTE — Progress Notes (Signed)
Careteam: Patient Care Team: Mast, Man X, NP as PCP - General (Internal Medicine) Allyson Sabal Delton See, MD as PCP - Cardiology (Cardiology)  PLACE OF SERVICE:  [facility]  Advanced Directive information Does Patient Have a Medical Advance Directive?: Yes, Type of Advance Directive: Healthcare Power of [place];Living will, Does patient want to make changes to medical advance directive?: No - Patient declined  Allergies  Allergen Reactions   Morphine Other (See Comments)    Medication did not work for pain    Penicillins Other (See Comments)    UNSPECIFIED REACTION  Unknown reaction as a child.  Tolerated Zosyn 2017. Tolerated Cephalosporin Date: 02/17/22.     Pregabalin Anxiety    Lyrica    Pregabalin Swelling and Anxiety    Lyrica   anxiety    Chief Complaint  Patient presents with   Acute Visit    Discuss Depression.     HPI: Patient is a 82 y.o. female is here for depression, Chronic Left knee pain    Depression  Feels down, depressed Has no energy  Sleeping a lot  Denies thoughts having suicidal ideation  She is in the resident committee here at [facility]  States she is motivated to do things No recent change in her appetite  Phq9 -14   Chronic left knee pain  Chronic  Had left knee replacement last year  Follows with orthopedics Takes tramadol 1 tab as needed Does not want to take lyrica  She was taking cymbalta in the past and it helped her with the pain   Abnormal TSH  Lab Results  Component Value Date   TSH 5.29 (H) 04/06/2023      Review of Systems:  Review of Systems  Constitutional:  Negative for chills and fever.  HENT:  Negative for congestion and sore throat.   Eyes:  Negative for double vision.  Respiratory:  Negative for cough, sputum production and shortness of breath.   Cardiovascular:  Negative for chest pain, palpitations and leg swelling.  Gastrointestinal:  Negative for abdominal pain, heartburn and nausea.   Genitourinary:  Negative for dysuria, frequency and hematuria.  Musculoskeletal:  Positive for joint pain. Negative for falls and myalgias.  Neurological:  Negative for dizziness, sensory change and focal weakness.  Psychiatric/Behavioral:  Positive for depression. Negative for suicidal ideas. The patient does not have insomnia.     Past Medical History:  Diagnosis Date   Anemia     mild as per PCP   Anginal pain (HCC)    06/20/20, relieved    Arthritis    hip/ s/p recent shoulder fracture 8/12- RIGHT   Asthma    Coronary artery disease 05/30/2018   LAD PCI/DES   GI bleeding    Hiatal hernia    History of kidney stones    Last year had surgery   Humerus fracture    with wrist on right side   Hypertension    hypercholesterolemia/  EKG on chart with clearance and note 06/18/11  Mazzocchi   Pneumonia    Pre-diabetes    Past Surgical History:  Procedure Laterality Date   ABDOMINAL HYSTERECTOMY     COLECTOMY WITH COLOSTOMY CREATION/HARTMANN PROCEDURE N/A 05/07/2016   Procedure: COLOSTOMY CREATION/HARTMANN PROCEDURE;  Surgeon: Romie Levee, MD;  [facility]: [facility];  Service: General;  Laterality: N/A;   COLONOSCOPY N/A 08/26/2016   Procedure: COLONOSCOPY;  Surgeon: Romie Levee, MD;  [facility];  Service: Endoscopy;  Laterality: N/A;   COLOSTOMY TAKEDOWN N/A  08/27/2016   Procedure: LAPAROSCOPIC COLOSTOMY REVERSAL;  Surgeon: Romie Levee, MD;  [facility];  Service: General;  Laterality: N/A;   CORONARY ARTERY BYPASS GRAFT N/A 11/21/2018   Procedure: CORONARY ARTERY BYPASS GRAFTING (CABG) x Three , using left internal mammary artery and right leg greater saphenous vein harvested endoscopically;  Surgeon: Alleen Borne, MD;  [facility];  Service: Open Heart Surgery;  Laterality: N/A;   CORONARY STENT INTERVENTION N/A 05/30/2018   Procedure: CORONARY STENT INTERVENTION;  Surgeon: Runell Gess, MD;  [facility];  Service: Cardiovascular;   Laterality: N/A;   CYSTOSCOPY WITH RETROGRADE PYELOGRAM, URETEROSCOPY AND STENT PLACEMENT Bilateral 06/21/2020   Procedure: CYSTOSCOPY WITH RETROGRADE PYELOGRAM, AND BILATERAL STENT PLACEMENT;  Surgeon: Sebastian Ache, MD;  [facility];  Service: Urology;  Laterality: Bilateral;   CYSTOSCOPY WITH RETROGRADE PYELOGRAM, URETEROSCOPY AND STENT PLACEMENT Bilateral 07/12/2020   Procedure: CYSTOSCOPY WITH RETROGRADE PYELOGRAM, URETEROSCOPY AND STENT PLACEMENT;  Surgeon: Crist Fat, MD;  [facility];  Service: Urology;  Laterality: Bilateral;  2 HRS   EYE SURGERY     eyelid tuck bilateral   HOLMIUM LASER APPLICATION Bilateral 07/12/2020   Procedure: HOLMIUM LASER APPLICATION;  Surgeon: Crist Fat, MD;  [facility];  Service: Urology;  Laterality: Bilateral;   LAPAROTOMY N/A 05/07/2016   Procedure: EXPLORATORY LAPAROTOMY, LYLSIS OF ADHSIONS, Pam Drown OF PERITONEAL ABSCESS;  Surgeon: Romie Levee, MD;  [facility];  Service: General;  Laterality: N/A;   LEFT HEART CATH AND CORONARY ANGIOGRAPHY N/A 05/30/2018   Procedure: LEFT HEART CATH AND CORONARY ANGIOGRAPHY;  Surgeon: Runell Gess, MD;  [facility];  Service: Cardiovascular;  Laterality: N/A;   LEFT HEART CATH AND CORONARY ANGIOGRAPHY N/A 11/17/2018   Procedure: LEFT HEART CATH AND CORONARY ANGIOGRAPHY;  Surgeon: Runell Gess, MD;  [facility];  Service: Cardiovascular;  Laterality: N/A;   PARATHYROIDECTOMY     one lobe- benign per pt   TEE WITHOUT CARDIOVERSION N/A 11/21/2018   Procedure: TRANSESOPHAGEAL ECHOCARDIOGRAM (TEE);  Surgeon: Alleen Borne, MD;  Location: [facility] OR;  Service: Open Heart Surgery;  Laterality: N/A;   TONSILLECTOMY     TOTAL HIP ARTHROPLASTY  07/08/2011   Procedure: TOTAL HIP ARTHROPLASTY;  Surgeon: Gus Rankin Aluisio;  [facility];  Service: Orthopedics;  Laterality: Right;   TOTAL KNEE ARTHROPLASTY Left 02/16/2022   Procedure: TOTAL KNEE  ARTHROPLASTY;  Surgeon: Ollen Gross, MD;  [facility];  Service: Orthopedics;  Laterality: Left;   Social History:   reports that she has never smoked. She has never used smokeless tobacco. She reports current alcohol use. She reports that she does not use drugs.  Family History  Problem Relation Age of Onset   CAD Father 46       PCI   Liver disease Neg Hx    Esophageal cancer Neg Hx    Colon cancer Neg Hx     Medications: Patient's Medications  New Prescriptions   No medications on file  Previous Medications   ATORVASTATIN (LIPITOR) 80 MG TABLET    TAKE 1 TABLET(80 MG) BY MOUTH DAILY   BUDESONIDE-FORMOTEROL (SYMBICORT) 80-4.5 MCG/ACT INHALER    Inhale 2 puffs into the lungs in the morning and at bedtime. Also uses as needed for emergency purposes   CARVEDILOL (COREG) 12.5 MG TABLET    TAKE 1 TABLET BY MOUTH 2 TIMES A DAY   CETIRIZINE (ZYRTEC) 10 MG TABLET  Take 10 mg by mouth at bedtime.   CLOPIDOGREL (PLAVIX) 75 MG TABLET    Take 1 tablet (75 mg total) by mouth daily.   FAMOTIDINE (PEPCID AC) 10 MG TABLET    Take 10 mg by mouth daily as needed for heartburn or indigestion.   IPRATROPIUM (ATROVENT) 0.03 % NASAL SPRAY    Place 2 sprays into both nostrils 3 (three) times daily as needed for rhinitis.   LISINOPRIL (PRINIVIL,ZESTRIL) 40 MG TABLET    Take 40 mg by mouth daily.   MULTIPLE VITAMINS-MINERALS (MULTIVITAMIN WOMEN 50+ PO)    Take 1 tablet by mouth daily.   NITROGLYCERIN (NITROSTAT) 0.4 MG SL TABLET    Place 1 tablet (0.4 mg total) under the tongue as needed.   OVER THE COUNTER MEDICATION    Apply 1 Application topically as needed (pain). Magne Sport Balm   PANTOPRAZOLE (PROTONIX) 40 MG TABLET    Take 1 tablet (40 mg total) by mouth 2 (two) times daily before a meal.   PREGABALIN (LYRICA) 25 MG CAPSULE    Take 1 capsule (25 mg total) by mouth daily.   SIMETHICONE (PHAZYME ULTIMATE) 500 MG CAPS    Take 500 mg by mouth daily as needed (bloating).   SOFT LENS PRODUCTS  (BIOTRUE) SOLN    Place 1 drop into both eyes as needed (dry eyes).   SUCRALFATE (CARAFATE) 1 G TABLET    Take your Protonix at least 30 minutes prior to your first morning dose of Carafate.   TRAMADOL (ULTRAM) 50 MG TABLET    TAKE 1 TABLET BY MOUTH EVERY 6 HOURS AS NEEDED  Modified Medications   No medications on file  Discontinued Medications   No medications on file    Physical Exam:  Vitals:   04/23/23 0819  Height: 5\' 5"  (1.651 m)   Body mass index is 31.95 kg/m. Wt Readings from Last 3 Encounters:  04/08/23 192 lb (87.1 kg)  03/23/23 190 lb 12.8 oz (86.5 kg)  03/17/23 188 lb (85.3 kg)    Physical Exam Constitutional:      Appearance: Normal appearance.  HENT:     Head: Normocephalic and atraumatic.  Cardiovascular:     Rate and Rhythm: Normal rate and regular rhythm.     Heart sounds: No murmur heard. Pulmonary:     Effort: Pulmonary effort is normal. No respiratory distress.     Breath sounds: Normal breath sounds. No wheezing.  Abdominal:     General: Bowel sounds are normal. There is no distension.     Tenderness: There is no abdominal tenderness. There is no guarding or rebound.  Musculoskeletal:        General: No swelling or tenderness.     Comments: Left knee- surgical scar [place] + , minimal swelling , rom intact  No erythema    Skin:    General: Skin is dry.  Neurological:     Mental Status: She is alert. Mental status is at baseline.     Sensory: No sensory deficit.     Motor: No weakness.     Labs reviewed: Basic Metabolic Panel: Recent Labs    01/12/23 0710 01/19/23 0734 04/06/23 0729  NA 141  --   --   K 4.0  --   --   CL 104  --   --   CO2 28  --   --   GLUCOSE 121*  --   --   BUN 20  --   --  CREATININE 0.65  --   --   CALCIUM 9.5  --   --   TSH 6.27* 4.64* 5.29*   Liver Function Tests: Recent Labs    01/12/23 0710  AST 16  ALT 16  BILITOT 0.9  PROT 6.6   No results for input(s): "LIPASE", "AMYLASE" in the last 8760  hours. No results for input(s): "AMMONIA" in the last 8760 hours. CBC: Recent Labs    01/12/23 0710  WBC 6.7  NEUTROABS 3,926  HGB 13.0  HCT 39.2  MCV 90.5  PLT 169   Lipid Panel: Recent Labs    01/12/23 0710  CHOL 166  HDL 53  LDLCALC 83  TRIG 201*  CHOLHDL 3.1   TSH: Recent Labs    01/12/23 0710 01/19/23 0734 04/06/23 0729  TSH 6.27* 4.64* 5.29*   A1C: Lab Results  Component Value Date   HGBA1C 6.8 (H) 04/06/2023     Assessment/Plan 1. Current moderate episode of major depressive disorder without prior episode (HCC) Phq9- 14  Will start cymbalta 90 mg  Pt reported that she did good while she was on cymbalta in the past and had no reaction   2. Primary osteoarthritis of left knee Pt is planning to start PT to her left knee from next Monday  Instructed her to take tylenol 650 mg every 6 hrs as needed  Use tramadol for severe pain  She is using OTC compounded cream and states its helping her  3. Acquired hypothyroidism Pt reports tired with no energy  Her TSH is high since June Will start low dose synthroid  Will check TSH in 6 weeks  - levothyroxine (SYNTHROID) 25 MCG tablet; Take 1 tablet (25 mcg total) by mouth daily.  Dispense: 90 tablet; Refill: 3 - TSH  Other orders - DULoxetine (CYMBALTA) 30 MG capsule; Take 3 capsules (90 mg total) by mouth daily.  Dispense: 270 capsule; Refill: 3   No follow-ups on file.:  4 weeks    I spent greater than 40 minutes for the care of this patient in face to face time, chart review, clinical documentation, patient education.

## 2023-05-14 ENCOUNTER — Encounter: Payer: Self-pay | Admitting: Sports Medicine

## 2023-05-14 ENCOUNTER — Telehealth: Payer: Self-pay

## 2023-05-14 ENCOUNTER — Other Ambulatory Visit: Payer: Self-pay | Admitting: Sports Medicine

## 2023-05-14 ENCOUNTER — Non-Acute Institutional Stay: Payer: Medicare Other | Admitting: Sports Medicine

## 2023-05-14 VITALS — BP 160/98 | HR 100 | Temp 97.2°F | Resp 17 | Ht 65.0 in | Wt 187.6 lb

## 2023-05-14 DIAGNOSIS — G629 Polyneuropathy, unspecified: Secondary | ICD-10-CM

## 2023-05-14 DIAGNOSIS — E039 Hypothyroidism, unspecified: Secondary | ICD-10-CM | POA: Diagnosis not present

## 2023-05-14 DIAGNOSIS — R300 Dysuria: Secondary | ICD-10-CM

## 2023-05-14 DIAGNOSIS — R5383 Other fatigue: Secondary | ICD-10-CM

## 2023-05-14 DIAGNOSIS — F321 Major depressive disorder, single episode, moderate: Secondary | ICD-10-CM

## 2023-05-14 DIAGNOSIS — I10 Essential (primary) hypertension: Secondary | ICD-10-CM

## 2023-05-14 NOTE — Progress Notes (Signed)
Careteam: Patient Care Team: Mast, Man X, NP as PCP - General (Internal Medicine) Allyson Sabal Delton See, MD as PCP - Cardiology (Cardiology)  PLACE OF SERVICE:  [facility]  Advanced Directive information Does Patient Have a Medical Advance Directive?: Yes, Type of Advance Directive: Healthcare Power of [place];Living will, Does patient want to make changes to medical advance directive?: No - Patient declined  Allergies  Allergen Reactions   Morphine Other (See Comments)    Medication did not work for pain    Penicillins Other (See Comments)    UNSPECIFIED REACTION  Unknown reaction as a child.  Tolerated Zosyn 2017. Tolerated Cephalosporin Date: 02/17/22.     Pregabalin Anxiety    Lyrica    Pregabalin Swelling and Anxiety    Lyrica   anxiety    Chief Complaint  Patient presents with   Follow-up    4 week follow up.     HPI: Patient is a 82 y.o. female is here for follow up  Pt reports that  she got covid and RSV shot weeks ago and felt miserable  She saw allergy specialist and prescribed antibiotics  She developed allergic reactions with mild swelling of lips.  C/o intermittent dysuria since few days  Denies lower abdominal pain, nausea, vomiting , frequency  Wants to check her urine   Depression  States her mood is better  Feels like she has no energy  Stopped taking cymbalta  Appetite improved    Neuropathy  She started taking lyrica   Hypothyroidism  Stopped taking levothyroxine 10 days ago     Review of Systems:  Review of Systems  Constitutional:  Positive for malaise/fatigue. Negative for chills and fever.  HENT:  Negative for congestion and sore throat.   Eyes:  Negative for double vision.  Respiratory:  Negative for cough, sputum production and shortness of breath.   Cardiovascular:  Negative for chest pain, palpitations and leg swelling.  Gastrointestinal:  Negative for abdominal pain, heartburn and nausea.  Genitourinary:  Positive for  dysuria. Negative for frequency and hematuria.  Musculoskeletal:  Negative for falls and myalgias.  Neurological:  Negative for dizziness, sensory change and focal weakness.  Psychiatric/Behavioral:  Positive for depression. Negative for suicidal ideas.     Past Medical History:  Diagnosis Date   Anemia     mild as per PCP   Anginal pain (HCC)    06/20/20, relieved    Arthritis    hip/ s/p recent shoulder fracture 8/12- RIGHT   Asthma    Coronary artery disease 05/30/2018   LAD PCI/DES   GI bleeding    Hiatal hernia    History of kidney stones    Last year had surgery   Humerus fracture    with wrist on right side   Hypertension    hypercholesterolemia/  EKG on chart with clearance and note 06/18/11  Mazzocchi   Pneumonia    Pre-diabetes    Past Surgical History:  Procedure Laterality Date   ABDOMINAL HYSTERECTOMY     COLECTOMY WITH COLOSTOMY CREATION/HARTMANN PROCEDURE N/A 05/07/2016   Procedure: COLOSTOMY CREATION/HARTMANN PROCEDURE;  Surgeon: Romie Levee, MD;  [facility];  Service: General;  Laterality: N/A;   COLONOSCOPY N/A 08/26/2016   Procedure: COLONOSCOPY;  Surgeon: Romie Levee, MD;  [facility];  Service: Endoscopy;  Laterality: N/A;   COLOSTOMY TAKEDOWN N/A 08/27/2016   Procedure: LAPAROSCOPIC COLOSTOMY REVERSAL;  Surgeon: Romie Levee, MD;  [facility];  Service: General;  Laterality: N/A;  CORONARY ARTERY BYPASS GRAFT N/A 11/21/2018   Procedure: CORONARY ARTERY BYPASS GRAFTING (CABG) x Three , using left internal mammary artery and right leg greater saphenous vein harvested endoscopically;  Surgeon: Alleen Borne, MD;  [facility];  Service: Open Heart Surgery;  Laterality: N/A;   CORONARY STENT INTERVENTION N/A 05/30/2018   Procedure: CORONARY STENT INTERVENTION;  Surgeon: Runell Gess, MD;  [facility];  Service: Cardiovascular;  Laterality: N/A;   CYSTOSCOPY WITH RETROGRADE PYELOGRAM, URETEROSCOPY AND STENT  PLACEMENT Bilateral 06/21/2020   Procedure: CYSTOSCOPY WITH RETROGRADE PYELOGRAM, AND BILATERAL STENT PLACEMENT;  Surgeon: Sebastian Ache, MD;  [facility];  Service: Urology;  Laterality: Bilateral;   CYSTOSCOPY WITH RETROGRADE PYELOGRAM, URETEROSCOPY AND STENT PLACEMENT Bilateral 07/12/2020   Procedure: CYSTOSCOPY WITH RETROGRADE PYELOGRAM, URETEROSCOPY AND STENT PLACEMENT;  Surgeon: Crist Fat, MD;  [facility];  Service: Urology;  Laterality: Bilateral;  2 HRS   EYE SURGERY     eyelid tuck bilateral   HOLMIUM LASER APPLICATION Bilateral 07/12/2020   Procedure: HOLMIUM LASER APPLICATION;  Surgeon: Crist Fat, MD;  [facility];  Service: Urology;  Laterality: Bilateral;   LAPAROTOMY N/A 05/07/2016   Procedure: EXPLORATORY LAPAROTOMY, LYLSIS OF ADHSIONS, Pam Drown OF PERITONEAL ABSCESS;  Surgeon: Romie Levee, MD;  [facility];  Service: General;  Laterality: N/A;   LEFT HEART CATH AND CORONARY ANGIOGRAPHY N/A 05/30/2018   Procedure: LEFT HEART CATH AND CORONARY ANGIOGRAPHY;  Surgeon: Runell Gess, MD;  [facility];  Service: Cardiovascular;  Laterality: N/A;   LEFT HEART CATH AND CORONARY ANGIOGRAPHY N/A 11/17/2018   Procedure: LEFT HEART CATH AND CORONARY ANGIOGRAPHY;  Surgeon: Runell Gess, MD;  [facility];  Service: Cardiovascular;  Laterality: N/A;   PARATHYROIDECTOMY     one lobe- benign per pt   TEE WITHOUT CARDIOVERSION N/A 11/21/2018   Procedure: TRANSESOPHAGEAL ECHOCARDIOGRAM (TEE);  Surgeon: Alleen Borne, MD;  Location[facility];  Service: Open Heart Surgery;  Laterality: N/A;   TONSILLECTOMY     TOTAL HIP ARTHROPLASTY  07/08/2011   Procedure: TOTAL HIP ARTHROPLASTY;  Surgeon: Gus Rankin Aluisio;  [facility];  Service: Orthopedics;  Laterality: Right;   TOTAL KNEE ARTHROPLASTY Left 02/16/2022   Procedure: TOTAL KNEE ARTHROPLASTY;  Surgeon: Ollen Gross, MD;  [facility];  Service: Orthopedics;   Laterality: Left;   Social History:   reports that she has never smoked. She has never used smokeless tobacco. She reports current alcohol use. She reports that she does not use drugs.  Family History  Problem Relation Age of Onset   CAD Father 71       PCI   Liver disease Neg Hx    Esophageal cancer Neg Hx    Colon cancer Neg Hx     Medications: Patient's Medications  New Prescriptions   No medications on file  Previous Medications   ATORVASTATIN (LIPITOR) 80 MG TABLET    TAKE 1 TABLET(80 MG) BY MOUTH DAILY   BUDESONIDE-FORMOTEROL (SYMBICORT) 80-4.5 MCG/ACT INHALER    Inhale 2 puffs into the lungs in the morning and at bedtime. Also uses as needed for emergency purposes   CARVEDILOL (COREG) 12.5 MG TABLET    TAKE 1 TABLET BY MOUTH 2 TIMES A DAY   CETIRIZINE (ZYRTEC) 10 MG TABLET    Take 10 mg by mouth at bedtime.   CLOPIDOGREL (PLAVIX) 75 MG TABLET    Take 1 tablet (75 mg total)  by mouth daily.   DULOXETINE (CYMBALTA) 30 MG CAPSULE    Take 3 capsules (90 mg total) by mouth daily.   FAMOTIDINE (PEPCID AC) 10 MG TABLET    Take 10 mg by mouth daily as needed for heartburn or indigestion.   IPRATROPIUM (ATROVENT) 0.03 % NASAL SPRAY    Place 2 sprays into both nostrils 3 (three) times daily as needed for rhinitis.   LEVOTHYROXINE (SYNTHROID) 25 MCG TABLET    Take 1 tablet (25 mcg total) by mouth daily.   LISINOPRIL (PRINIVIL,ZESTRIL) 40 MG TABLET    Take 40 mg by mouth daily.   MULTIPLE VITAMINS-MINERALS (MULTIVITAMIN WOMEN 50+ PO)    Take 1 tablet by mouth daily.   NITROGLYCERIN (NITROSTAT) 0.4 MG SL TABLET    Place 1 tablet (0.4 mg total) under the tongue as needed.   OVER THE COUNTER MEDICATION    Apply 1 Application topically as needed (pain). Magne Sport Balm   PANTOPRAZOLE (PROTONIX) 40 MG TABLET    Take 1 tablet (40 mg total) by mouth 2 (two) times daily before a meal.   PREGABALIN (LYRICA) 25 MG CAPSULE    Take 1 capsule (25 mg total) by mouth daily.   SIMETHICONE (PHAZYME  ULTIMATE) 500 MG CAPS    Take 500 mg by mouth daily as needed (bloating).   SOFT LENS PRODUCTS (BIOTRUE) SOLN    Place 1 drop into both eyes as needed (dry eyes).   SUCRALFATE (CARAFATE) 1 G TABLET    Take your Protonix at least 30 minutes prior to your first morning dose of Carafate.  Modified Medications   No medications on file  Discontinued Medications   TRAMADOL (ULTRAM) 50 MG TABLET    TAKE 1 TABLET BY MOUTH EVERY 6 HOURS AS NEEDED    Physical Exam:  Vitals:   05/14/23 1105  BP: (!) 174/88  Pulse: 100  Resp: 17  Temp: (!) 97.2 F (36.2 C)  SpO2: 97%  Weight: 187 lb 9.6 oz (85.1 kg)  Height: 5\' 5"  (1.651 m)   Body mass index is 31.22 kg/m. Wt Readings from Last 3 Encounters:  05/14/23 187 lb 9.6 oz (85.1 kg)  04/23/23 188 lb 12.8 oz (85.6 kg)  04/08/23 192 lb (87.1 kg)    Physical Exam Constitutional:      Appearance: Normal appearance.  HENT:     Head: Normocephalic and atraumatic.  Cardiovascular:     Rate and Rhythm: Normal rate and regular rhythm.     Heart sounds: No murmur heard. Pulmonary:     Effort: Pulmonary effort is normal. No respiratory distress.     Breath sounds: Normal breath sounds. No wheezing.  Abdominal:     General: Bowel sounds are normal. There is no distension.     Tenderness: There is no abdominal tenderness. There is no guarding or rebound.  Musculoskeletal:        General: No swelling or tenderness.  Skin:    General: Skin is dry.  Neurological:     Mental Status: She is alert. Mental status is at baseline.     Sensory: No sensory deficit.     Motor: No weakness.     Labs reviewed: Basic Metabolic Panel: Recent Labs    01/12/23 0710 01/19/23 0734 04/06/23 0729  NA 141  --   --   K 4.0  --   --   CL 104  --   --   CO2 28  --   --   GLUCOSE  121*  --   --   BUN 20  --   --   CREATININE 0.65  --   --   CALCIUM 9.5  --   --   TSH 6.27* 4.64* 5.29*   Liver Function Tests: Recent Labs    01/12/23 0710  AST 16  ALT  16  BILITOT 0.9  PROT 6.6   No results for input(s): "LIPASE", "AMYLASE" in the last 8760 hours. No results for input(s): "AMMONIA" in the last 8760 hours. CBC: Recent Labs    01/12/23 0710  WBC 6.7  NEUTROABS 3,926  HGB 13.0  HCT 39.2  MCV 90.5  PLT 169   Lipid Panel: Recent Labs    01/12/23 0710  CHOL 166  HDL 53  LDLCALC 83  TRIG 201*  CHOLHDL 3.1   TSH: Recent Labs    01/12/23 0710 01/19/23 0734 04/06/23 0729  TSH 6.27* 4.64* 5.29*   A1C: Lab Results  Component Value Date   HGBA1C 6.8 (H) 04/06/2023     Assessment/Plan    1. Dysuria No tenderness on palpation - Urinalysis - Culture, Urine  2. Acquired hypothyroidism Instructed to resume synthyroid  Will check TSH in 4 weeks   3. Other fatigue Ordered cbc, cmp at last visit   4. Neuropathy Cont with pregablin  5. Current moderate episode of major depressive disorder without prior episode (HCC) Doing better  Not interested in trying other meds at this time  HTN  Repeat BP  still high  Instructed to monitor BP daily  Keep a log  Avoid salty foods  Notify if bp is persistently high    No follow-ups on file.:   4 weeks  I spent greater than  40 minutes for the care of this patient in face to face time, chart review, clinical documentation, patient education.

## 2023-05-14 NOTE — Telephone Encounter (Signed)
Patient was seen in office today by Dr.Veludandi. I gave patient supplies for urine clean catch. I didn't know where to take urine specimen. I gave to RN at [facility] who stated she would take it for me. I attempted to call [facility] for pick up and they didn't recognize location or account number I provided them with. I called Aggie Cosier and notified her RN took urine specimen for me and would drop off to pick up area. Aggie Cosier stated  told me to place note on her desk with patient name, DOB, etc and she would call to have urine specimen picked up. Patient urine specimen had 3 Identifiers, (First and Last Name, DOB, MRN ), and [facility] requisition from computer for UA/Culture. Message typed up as FYI.

## 2023-05-17 ENCOUNTER — Encounter: Payer: Self-pay | Admitting: Sports Medicine

## 2023-05-18 ENCOUNTER — Telehealth (INDEPENDENT_AMBULATORY_CARE_PROVIDER_SITE_OTHER): Payer: Medicare Other | Admitting: Nurse Practitioner

## 2023-05-18 ENCOUNTER — Encounter: Payer: Self-pay | Admitting: Nurse Practitioner

## 2023-05-18 DIAGNOSIS — R5383 Other fatigue: Secondary | ICD-10-CM | POA: Diagnosis not present

## 2023-05-18 DIAGNOSIS — E039 Hypothyroidism, unspecified: Secondary | ICD-10-CM

## 2023-05-18 NOTE — Progress Notes (Unsigned)
This service is provided via telemedicine  No vital signs collected/recorded due to the encounter was a telemedicine visit.   Location of patient (ex: home, work):  Home  Patient consents to a telephone visit:  Yes  Location of the provider (ex: office, home):  Office [place].   Name of any referring provider:  na  Names of all persons participating in the telemedicine service and their role in the encounter:  Gabriel Rung Riely Oetken, CMA, Abbey Chatters, NP  Time spent on call:  7:11

## 2023-05-18 NOTE — Progress Notes (Unsigned)
Careteam: Patient Care Team: Mast, Man X, NP as PCP - General (Internal Medicine) Allyson Sabal Delton See, MD as PCP - Cardiology (Cardiology)  Advanced Directive information Does Patient Have a Medical Advance Directive?: Yes, Type of Advance Directive: Healthcare Power of [place];Living will, Does patient want to make changes to medical advance directive?: No - Patient declined  Allergies  Allergen Reactions   Morphine Other (See Comments)    Medication did not work for pain    Clindamycin/Lincomycin    Penicillins Other (See Comments)    UNSPECIFIED REACTION  Unknown reaction as a child.  Tolerated Zosyn 2017. Tolerated Cephalosporin Date: 02/17/22.     Sulfamethoxazole-Trimethoprim    Pregabalin Anxiety    Lyrica    Pregabalin Swelling and Anxiety    Lyrica   anxiety    Chief Complaint  Patient presents with   Acute Visit    Complains of Fatigue and no Energy.      HPI: Patient is a 82 y.o. female via video visit.  Reports she took rsv and covid vaccine on 9/29 Then she started to have runny nose and flu like symptoms.  Went to allergist and was prescribed antibiotic and steroids.  Did not get any better for a while.  Took covid test which was negative.  Never took a flu test. She had has a flu vaccine.   Now with increase in fatigue and has been very hungry. Saw Dr Jacquenette Shone on 10/11.  Eating in the middle of the night.  Just wants to sleep all the time.  Reports she is sleeping 12 hours at night.  She was recently started on synthroid due to hypothyroid.  She has been taking since last week (~4 days).   Her therapist recommended more blood test.  No dark stools.  Reports she has had an hx of abscess colon in 2017 and reports she was very fatigued at that time. Had no symptoms at that time  She is having regular BM and soft and regular.  No abdominal pain No chest pain.  Occasional shortness of breath- not new- has asthma and uses inhaler.  Review of  Systems:  Review of Systems  Constitutional:  Positive for malaise/fatigue. Negative for chills, fever and weight loss.  HENT:  Negative for tinnitus.   Respiratory:  Negative for cough, sputum production and shortness of breath.   Cardiovascular:  Negative for chest pain, palpitations and leg swelling.  Gastrointestinal:  Negative for abdominal pain, constipation, diarrhea and heartburn.  Genitourinary:  Negative for dysuria, frequency and urgency.  Musculoskeletal:  Negative for back pain, falls, joint pain and myalgias.  Skin: Negative.   Neurological:  Negative for dizziness and headaches.  Psychiatric/Behavioral:  Negative for depression and memory loss. The patient does not have insomnia.     Past Medical History:  Diagnosis Date   Anemia     mild as per PCP   Anginal pain (HCC)    06/20/20, relieved    Arthritis    hip/ s/p recent shoulder fracture 8/12- RIGHT   Asthma    Coronary artery disease 05/30/2018   LAD PCI/DES   GI bleeding    Hiatal hernia    History of kidney stones    Last year had surgery   Humerus fracture    with wrist on right side   Hypertension    hypercholesterolemia/  EKG on chart with clearance and note 06/18/11  [facility]chi   Pneumonia    Pre-diabetes    Past Surgical  History:  Procedure Laterality Date   ABDOMINAL HYSTERECTOMY     COLECTOMY WITH COLOSTOMY CREATION/HARTMANN PROCEDURE N/A 05/07/2016   Procedure: COLOSTOMY CREATION/HARTMANN PROCEDURE;  Surgeon: Romie Levee, MD;  Location: [facility];  Service: General;  Laterality: N/A;   COLONOSCOPY N/A 08/26/2016   Procedure: COLONOSCOPY;  Surgeon: Romie Levee, MD;  Location: [facility];  Service: Endoscopy;  Laterality: N/A;   COLOSTOMY TAKEDOWN N/A 08/27/2016   Procedure: LAPAROSCOPIC COLOSTOMY REVERSAL;  Surgeon: Romie Levee, MD;  Location: [facility];  Service: General;  Laterality: N/A;   CORONARY ARTERY BYPASS GRAFT N/A 11/21/2018   Procedure: CORONARY ARTERY BYPASS GRAFTING (CABG) x Three ,  using left internal mammary artery and right leg greater saphenous vein harvested endoscopically;  Surgeon: Alleen Borne, MD;  Location: [facility];  Service: Open Heart Surgery;  Laterality: N/A;   CORONARY STENT INTERVENTION N/A 05/30/2018   Procedure: CORONARY STENT INTERVENTION;  Surgeon: Runell Gess, MD;  Location: [facility];  Service: Cardiovascular;  Laterality: N/A;   CYSTOSCOPY WITH RETROGRADE PYELOGRAM, URETEROSCOPY AND STENT PLACEMENT Bilateral 06/21/2020   Procedure: CYSTOSCOPY WITH RETROGRADE PYELOGRAM, AND BILATERAL STENT PLACEMENT;  Surgeon: Sebastian Ache, MD;  Location: [facility];  Service: Urology;  Laterality: Bilateral;   CYSTOSCOPY WITH RETROGRADE PYELOGRAM, URETEROSCOPY AND STENT PLACEMENT Bilateral 07/12/2020   Procedure: CYSTOSCOPY WITH RETROGRADE PYELOGRAM, URETEROSCOPY AND STENT PLACEMENT;  Surgeon: Crist Fat, MD;  Location: [facility];  Service: Urology;  Laterality: Bilateral;  2 HRS   EYE SURGERY     eyelid tuck bilateral   HOLMIUM LASER APPLICATION Bilateral 07/12/2020   Procedure: HOLMIUM LASER APPLICATION;  Surgeon: Crist Fat, MD;  Location: [facility];  Service: Urology;  Laterality: Bilateral;   LAPAROTOMY N/A 05/07/2016   Procedure: EXPLORATORY LAPAROTOMY, LYLSIS OF ADHSIONS, Pam Drown OF PERITONEAL ABSCESS;  Surgeon: Romie Levee, MD;  Location: [facility];  Service: General;  Laterality: N/A;   LEFT HEART CATH AND CORONARY ANGIOGRAPHY N/A 05/30/2018   Procedure: LEFT HEART CATH AND CORONARY ANGIOGRAPHY;  Surgeon: Runell Gess, MD;  [facility];  Service: Cardiovascular;  Laterality: N/A;   LEFT HEART CATH AND CORONARY ANGIOGRAPHY N/A 11/17/2018   Procedure: LEFT HEART CATH AND CORONARY ANGIOGRAPHY;  Surgeon: Runell Gess, MD;  [facility];  Service: Cardiovascular;  Laterality: N/A;   PARATHYROIDECTOMY     one lobe- benign per pt   TEE WITHOUT CARDIOVERSION N/A 11/21/2018   Procedure: TRANSESOPHAGEAL  ECHOCARDIOGRAM (TEE);  Surgeon: Alleen Borne, MD;  [facility];  Service: Open Heart Surgery;  Laterality: N/A;   TONSILLECTOMY     TOTAL HIP ARTHROPLASTY  07/08/2011   Procedure: TOTAL HIP ARTHROPLASTY;  Surgeon: Gus Rankin Aluisio;  Location: [facility];  Service: Orthopedics;  Laterality: Right;   TOTAL KNEE ARTHROPLASTY Left 02/16/2022   Procedure: TOTAL KNEE ARTHROPLASTY;  Surgeon: Ollen Gross, MD;  Location: [facility];  Service: Orthopedics;  Laterality: Left;   Social History:   reports that she has never smoked. She has never used smokeless tobacco. She reports current alcohol use. She reports that she does not use drugs.  Family History  Problem Relation Age of Onset   CAD Father 52       PCI   Liver disease Neg Hx    Esophageal cancer Neg Hx    Colon cancer Neg Hx     Medications: Patient's Medications  New Prescriptions   No medications on file  Previous Medications   ATORVASTATIN (LIPITOR)  80 MG TABLET    TAKE 1 TABLET(80 MG) BY MOUTH DAILY   BUDESONIDE-FORMOTEROL (SYMBICORT) 80-4.5 MCG/ACT INHALER    Inhale 2 puffs into the lungs in the morning and at bedtime. Also uses as needed for emergency purposes   CARVEDILOL (COREG) 12.5 MG TABLET    TAKE 1 TABLET BY MOUTH 2 TIMES A DAY   CETIRIZINE (ZYRTEC) 10 MG TABLET    Take 10 mg by mouth at bedtime.   CLOPIDOGREL (PLAVIX) 75 MG TABLET    Take 1 tablet (75 mg total) by mouth daily.   FAMOTIDINE (PEPCID AC) 10 MG TABLET    Take 10 mg by mouth daily as needed for heartburn or indigestion.   IPRATROPIUM (ATROVENT) 0.03 % NASAL SPRAY    Place 2 sprays into both nostrils 3 (three) times daily as needed for rhinitis.   LEVOTHYROXINE (SYNTHROID) 25 MCG TABLET    Take 1 tablet (25 mcg total) by mouth daily.   LISINOPRIL (PRINIVIL,ZESTRIL) 40 MG TABLET    Take 40 mg by mouth daily.   MULTIPLE VITAMINS-MINERALS (MULTIVITAMIN WOMEN 50+ PO)    Take 1 tablet by mouth daily.   NITROGLYCERIN (NITROSTAT) 0.4 MG SL TABLET    Place 1 tablet  (0.4 mg total) under the tongue as needed.   OVER THE COUNTER MEDICATION    Apply 1 Application topically as needed (pain). Magne Sport Balm   PANTOPRAZOLE (PROTONIX) 40 MG TABLET    Take 1 tablet (40 mg total) by mouth 2 (two) times daily before a meal.   PREGABALIN (LYRICA) 25 MG CAPSULE    Take 1 capsule (25 mg total) by mouth daily.   SIMETHICONE (PHAZYME ULTIMATE) 500 MG CAPS    Take 500 mg by mouth daily as needed (bloating).   SOFT LENS PRODUCTS (BIOTRUE) SOLN    Place 1 drop into both eyes as needed (dry eyes).   SUCRALFATE (CARAFATE) 1 G TABLET    Take your Protonix at least 30 minutes prior to your first morning dose of Carafate.  Modified Medications   No medications on file  Discontinued Medications   DULOXETINE (CYMBALTA) 30 MG CAPSULE    Take 3 capsules (90 mg total) by mouth daily.    Physical Exam:  There were no vitals filed for this visit. There is no height or weight on file to calculate BMI. Wt Readings from Last 3 Encounters:  05/14/23 187 lb 9.6 oz (85.1 kg)  04/23/23 188 lb 12.8 oz (85.6 kg)  04/08/23 192 lb (87.1 kg)    Physical Exam Constitutional:      Appearance: Normal appearance.  Pulmonary:     Effort: Pulmonary effort is normal.  Neurological:     Mental Status: She is alert. Mental status is at baseline.  Psychiatric:        Mood and Affect: Mood normal.     Labs reviewed: Basic Metabolic Panel: Recent Labs    01/12/23 0710 01/19/23 0734 04/06/23 0729  NA 141  --   --   K 4.0  --   --   CL 104  --   --   CO2 28  --   --   GLUCOSE 121*  --   --   BUN 20  --   --   CREATININE 0.65  --   --   CALCIUM 9.5  --   --   TSH 6.27* 4.64* 5.29*   Liver Function Tests: Recent Labs    01/12/23 0710  AST 16  ALT 16  BILITOT 0.9  PROT 6.6   No results for input(s): "LIPASE", "AMYLASE" in the last 8760 hours. No results for input(s): "AMMONIA" in the last 8760 hours. CBC: Recent Labs    01/12/23 0710  WBC 6.7  NEUTROABS 3,926  HGB  13.0  HCT 39.2  MCV 90.5  PLT 169   Lipid Panel: Recent Labs    01/12/23 0710  CHOL 166  HDL 53  LDLCALC 83  TRIG 201*  CHOLHDL 3.1   TSH: Recent Labs    01/12/23 0710 01/19/23 0734 04/06/23 0729  TSH 6.27* 4.64* 5.29*   A1C: Lab Results  Component Value Date   HGBA1C 6.8 (H) 04/06/2023     Assessment/Plan 1. Other fatigue Ongoing, she has had recent changes in synthroid. She is concerned something else is going on due to her history. Will follow up cbc and bmp to rule out anemia or other abnormalities.   - CBC with Differential/Platelet - BASIC METABOLIC PANEL WITH GFR  2. Acquired hypothyroidism Continue current synthroid dose at 25 mcg daily Educated to take first thing in the morning on an empty stomach and to not take any other medication or food for at least 45 minutes.   Janene Harvey. Biagio Borg  [facility] [phone]    Virtual Visit via video  I connected with patient on 05/18/23 at 10:00 AM EDT by mychart and verified that I am speaking with the correct person using two identifiers.  Location: Patient: home Provider: [facility]   I discussed the limitations, risks, security and privacy concerns of performing an evaluation and management service by telephone and the availability of in person appointments. I also discussed with the patient that there may be a patient responsible charge related to this service. The patient expressed understanding and agreed to proceed.   I discussed the assessment and treatment plan with the patient. The patient was provided an opportunity to ask questions and all were answered. The patient agreed with the plan and demonstrated an understanding of the instructions.   The patient was advised to call back or seek an in-person evaluation if the symptoms worsen or if the condition fails to improve as anticipated.  I provided 15 minutes of non-face-to-face time during this  encounter.  Janene Harvey. Biagio Borg Avs printed and mailed

## 2023-05-20 LAB — URINE CULTURE

## 2023-05-20 LAB — URINALYSIS, ROUTINE W REFLEX MICROSCOPIC
Bilirubin Urine: NEGATIVE
Glucose, UA: NEGATIVE
Hgb urine dipstick: NEGATIVE
Leukocytes,Ua: NEGATIVE
Nitrite: NEGATIVE
Protein, ur: NEGATIVE
Specific Gravity, Urine: 1.028 (ref 1.001–1.035)
pH: <=5 (ref 5.0–8.0)

## 2023-05-20 LAB — EXTRA URINE SPECIMEN

## 2023-05-20 LAB — DUPLICATE REPORT

## 2023-05-20 LAB — HOUSE ACCOUNT TRACKING

## 2023-05-21 ENCOUNTER — Other Ambulatory Visit: Payer: Self-pay

## 2023-05-21 ENCOUNTER — Encounter: Payer: Medicare Other | Admitting: Sports Medicine

## 2023-05-21 MED ORDER — PREGABALIN 25 MG PO CAPS: 25.0000 mg | ORAL_CAPSULE | Freq: Every day | ORAL | 0 refills | Status: DC

## 2023-05-21 NOTE — Telephone Encounter (Signed)
Patient medication has allergy contraindications. Medication pend and sent to Abbey Chatters, NP due to PCP Mast, Man X, NP being out of office.

## 2023-05-24 NOTE — Telephone Encounter (Signed)
Patient medication refilled by Abbey Chatters, NP

## 2023-05-27 ENCOUNTER — Encounter: Payer: Self-pay | Admitting: Sports Medicine

## 2023-05-31 NOTE — Telephone Encounter (Signed)
Spoke with Rosey Bath Independent living Nurse at [facility], she called [facility] and showed me Urinalysis, no signs of UTI.

## 2023-06-01 ENCOUNTER — Other Ambulatory Visit: Payer: Medicare Other

## 2023-06-01 LAB — CBC WITH DIFFERENTIAL/PLATELET
Absolute Lymphocytes: 2044 {cells}/uL (ref 850–3900)
Absolute Monocytes: 382 {cells}/uL (ref 200–950)
Basophils Absolute: 60 {cells}/uL (ref 0–200)
Basophils Relative: 0.9 %
Eosinophils Absolute: 181 {cells}/uL (ref 15–500)
Eosinophils Relative: 2.7 %
HCT: 39.4 % (ref 35.0–45.0)
Hemoglobin: 12.9 g/dL (ref 11.7–15.5)
MCH: 30.1 pg (ref 27.0–33.0)
MCHC: 32.7 g/dL (ref 32.0–36.0)
MCV: 91.8 fL (ref 80.0–100.0)
MPV: 9.7 fL (ref 7.5–12.5)
Monocytes Relative: 5.7 %
Neutro Abs: 4033 {cells}/uL (ref 1500–7800)
Neutrophils Relative %: 60.2 %
Platelets: 181 10*3/uL (ref 140–400)
RBC: 4.29 10*6/uL (ref 3.80–5.10)
RDW: 14.6 % (ref 11.0–15.0)
Total Lymphocyte: 30.5 %
WBC: 6.7 10*3/uL (ref 3.8–10.8)

## 2023-06-01 LAB — BASIC METABOLIC PANEL WITH GFR
BUN/Creatinine Ratio: 41 (calc) — ABNORMAL HIGH (ref 6–22)
BUN: 27 mg/dL — ABNORMAL HIGH (ref 7–25)
CO2: 24 mmol/L (ref 20–32)
Calcium: 9.4 mg/dL (ref 8.6–10.4)
Chloride: 104 mmol/L (ref 98–110)
Creat: 0.66 mg/dL (ref 0.60–0.95)
Glucose, Bld: 128 mg/dL — ABNORMAL HIGH (ref 65–99)
Potassium: 4.2 mmol/L (ref 3.5–5.3)
Sodium: 141 mmol/L (ref 135–146)
eGFR: 87 mL/min/{1.73_m2} (ref >=60)

## 2023-06-03 NOTE — Telephone Encounter (Signed)
Called and discussed with patient

## 2023-06-10 ENCOUNTER — Encounter: Payer: Medicare Other | Admitting: Nurse Practitioner

## 2023-06-17 ENCOUNTER — Other Ambulatory Visit: Payer: Self-pay | Admitting: Nurse Practitioner

## 2023-06-17 ENCOUNTER — Encounter: Payer: Self-pay | Admitting: Nurse Practitioner

## 2023-06-17 ENCOUNTER — Non-Acute Institutional Stay: Payer: Medicare Other | Admitting: Nurse Practitioner

## 2023-06-17 VITALS — BP 154/90 | HR 87 | Temp 97.4°F | Resp 20 | Ht 65.0 in | Wt 195.6 lb

## 2023-06-17 DIAGNOSIS — I10 Essential (primary) hypertension: Secondary | ICD-10-CM

## 2023-06-17 DIAGNOSIS — M150 Primary generalized (osteo)arthritis: Secondary | ICD-10-CM | POA: Diagnosis not present

## 2023-06-17 DIAGNOSIS — E785 Hyperlipidemia, unspecified: Secondary | ICD-10-CM

## 2023-06-17 DIAGNOSIS — K219 Gastro-esophageal reflux disease without esophagitis: Secondary | ICD-10-CM

## 2023-06-17 DIAGNOSIS — E039 Hypothyroidism, unspecified: Secondary | ICD-10-CM

## 2023-06-17 DIAGNOSIS — G629 Polyneuropathy, unspecified: Secondary | ICD-10-CM

## 2023-06-17 DIAGNOSIS — E1159 Type 2 diabetes mellitus with other circulatory complications: Secondary | ICD-10-CM | POA: Diagnosis not present

## 2023-06-17 DIAGNOSIS — J45909 Unspecified asthma, uncomplicated: Secondary | ICD-10-CM

## 2023-06-17 DIAGNOSIS — I2510 Atherosclerotic heart disease of native coronary artery without angina pectoris: Secondary | ICD-10-CM

## 2023-06-17 DIAGNOSIS — Z9861 Coronary angioplasty status: Secondary | ICD-10-CM

## 2023-06-17 MED ORDER — AMLODIPINE BESYLATE 5 MG PO TABS
5.0000 mg | ORAL_TABLET | Freq: Every day | ORAL | 0 refills | Status: DC
Start: 2023-06-17 — End: 2023-07-13

## 2023-06-17 NOTE — Assessment & Plan Note (Signed)
TSH 6.27 01/12/23>>4.64 01/19/23<<5.29 04/06/23

## 2023-06-17 NOTE — Assessment & Plan Note (Addendum)
Blood pressure is not controlled,  taking Carvedilol, Lisinopril. Bun/creat 27/0.66 06/01/23, add Amlodipine 5mg  qd

## 2023-06-17 NOTE — Assessment & Plan Note (Addendum)
Stopped taking statin,  LDL 83 01/12/23

## 2023-06-17 NOTE — Assessment & Plan Note (Signed)
on Levothyroxine, TSH 5.29 04/06/23, f/u TSH 07/22/23

## 2023-06-17 NOTE — Assessment & Plan Note (Addendum)
A1c 6.4 01/12/23, 6.4 01/19/23, 6.8 04/06/23 06/17/23 foot exm: done  CMP/eGFR, urine ACR, TSH, Hgb A1c, Vit B12, lipids 07/22/23

## 2023-06-17 NOTE — Progress Notes (Signed)
Location:   Clinic [facility]   Place of Service:  Clinic (12) Provider: Chipper Oman NP  Code Status: DNR Goals of Care:     06/17/2023    3:31 PM  Advanced Directives  Does Patient Have a Medical Advance Directive? Yes  Type of Estate agent of [place];Living will  Does patient want to make changes to medical advance directive? No - Patient declined  Copy of Healthcare Power of Attorney in Chart? Yes - validated most recent copy scanned in chart (See row information)     Chief Complaint  Patient presents with   Acute Visit    Patient would like to discuss thyroid concerns, digestive issues and medications. Patient c/o mouth swelling. Foot exam today.     HPI: Patient is a 82 y.o. female seen today for medical management of chronic diseases.    Hypothyroidism, on Levothyroxine, TSH 5.29 04/06/23, f/u TSH  02/12/22 CT abd Cholelithiasis with mild gallbladder wall thickening, LFT wnl 01/12/23. C/o RLQ pain resolves after BM, negative Murphy's sign, denied abd pain associated with nausea, vomiting, she is afebrile.              HTN, adding Amlodipine 5mg  every day, taking Carvedilol, Lisinopril. Bun/creat 27/0.66 06/01/23             T2DM, A1c 6.4 01/12/23, 6.4 01/19/23, 6.8 04/06/23             HLD, taking Atorvastatin, LDL 83 01/12/23             Neuropathy, off Gabapentin, placed on Lyrica per patient's request.              Hx of UTI, s/p kidney stones surgical removal.              OA, s/p L knee replacement, R hip, L shoulder pain-limited ROM, s/p Ortho, no further surgery, on Tramadol. 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              GERD, on Famotidine, Pantoprazole, Sucralfate, Hgb 13 01/12/23             Asthma, Symbicort, Atrovent nasal spray                Past Medical History:  Diagnosis Date    Anemia     mild as per PCP   Anginal pain (HCC)    06/20/20, relieved    Arthritis    hip/ s/p recent shoulder fracture 8/12- RIGHT   Asthma    Coronary artery disease 05/30/2018   LAD PCI/DES   GI bleeding    Hiatal hernia    History of kidney stones    Last year had surgery   Humerus fracture    with wrist on right side   Hypertension    hypercholesterolemia/  EKG on chart with clearance and note 06/18/11  Mazzocchi   Pneumonia    Pre-diabetes     Past Surgical History:  Procedure Laterality Date   ABDOMINAL HYSTERECTOMY     COLECTOMY WITH COLOSTOMY CREATION/HARTMANN PROCEDURE N/A 05/07/2016   Procedure: COLOSTOMY CREATION/HARTMANN PROCEDURE;  Surgeon: Romie Levee, MD;  Location: [facility];  Service: General;  Laterality: N/A;   COLONOSCOPY N/A 08/26/2016  Procedure: COLONOSCOPY;  Surgeon: Romie Levee, MD;  Location: [facility];  Service: Endoscopy;  Laterality: N/A;   COLOSTOMY TAKEDOWN N/A 08/27/2016   Procedure: LAPAROSCOPIC COLOSTOMY REVERSAL;  Surgeon: Romie Levee, MD;  Location: [facility];  Service: General;  Laterality: N/A;   CORONARY ARTERY BYPASS GRAFT N/A 11/21/2018   Procedure: CORONARY ARTERY BYPASS GRAFTING (CABG) x Three , using left internal mammary artery and right leg greater saphenous vein harvested endoscopically;  Surgeon: Alleen Borne, MD;  Location: [facility];  Service: Open Heart Surgery;  Laterality: N/A;   CORONARY STENT INTERVENTION N/A 05/30/2018   Procedure: CORONARY STENT INTERVENTION;  Surgeon: Runell Gess, MD;  Location: [facility];  Service: Cardiovascular;  Laterality: N/A;   CYSTOSCOPY WITH RETROGRADE PYELOGRAM, URETEROSCOPY AND STENT PLACEMENT Bilateral 06/21/2020   Procedure: CYSTOSCOPY WITH RETROGRADE PYELOGRAM, AND BILATERAL STENT PLACEMENT;  Surgeon: Sebastian Ache, MD;  Location: [facility];  Service: Urology;  Laterality: Bilateral;   CYSTOSCOPY WITH RETROGRADE PYELOGRAM, URETEROSCOPY AND STENT PLACEMENT Bilateral 07/12/2020    Procedure: CYSTOSCOPY WITH RETROGRADE PYELOGRAM, URETEROSCOPY AND STENT PLACEMENT;  Surgeon: Crist Fat, MD;  Location: [facility];  Service: Urology;  Laterality: Bilateral;  2 HRS   EYE SURGERY     eyelid tuck bilateral   HOLMIUM LASER APPLICATION Bilateral 07/12/2020   Procedure: HOLMIUM LASER APPLICATION;  Surgeon: Crist Fat, MD;  Location: [facility];  Service: Urology;  Laterality: Bilateral;   LAPAROTOMY N/A 05/07/2016   Procedure: EXPLORATORY LAPAROTOMY, LYLSIS OF ADHSIONS, Pam Drown OF PERITONEAL ABSCESS;  Surgeon: Romie Levee, MD;  Location: [facility];  Service: General;  Laterality: N/A;   LEFT HEART CATH AND CORONARY ANGIOGRAPHY N/A 05/30/2018   Procedure: LEFT HEART CATH AND CORONARY ANGIOGRAPHY;  Surgeon: Runell Gess, MD;  [facility]: [facility];  Service: Cardiovascular;  Laterality: N/A;   LEFT HEART CATH AND CORONARY ANGIOGRAPHY N/A 11/17/2018   Procedure: LEFT HEART CATH AND CORONARY ANGIOGRAPHY;  Surgeon: Runell Gess, MD;  Location: [facility];  Service: Cardiovascular;  Laterality: N/A;   PARATHYROIDECTOMY     one lobe- benign per pt   TEE WITHOUT CARDIOVERSION N/A 11/21/2018   Procedure: TRANSESOPHAGEAL ECHOCARDIOGRAM (TEE);  Surgeon: Alleen Borne, MD;  Location[facility];  Service: Open Heart Surgery;  Laterality: N/A;   TONSILLECTOMY     TOTAL HIP ARTHROPLASTY  07/08/2011   Procedure: TOTAL HIP ARTHROPLASTY;  Surgeon: Gus Rankin Aluisio;  Location: [facility];  Service: Orthopedics;  Laterality: Right;   TOTAL KNEE ARTHROPLASTY Left 02/16/2022   Procedure: TOTAL KNEE ARTHROPLASTY;  Surgeon: Ollen Gross, MD;  Location: [facility];  Service: Orthopedics;  Laterality: Left;    Allergies  Allergen Reactions   Morphine Other (See Comments)    Medication did not work for pain    Clindamycin/Lincomycin    Penicillins Other (See Comments)    UNSPECIFIED REACTION  Unknown reaction as a child.  Tolerated Zosyn 2017. Tolerated Cephalosporin Date:  02/17/22.     Sulfamethoxazole-Trimethoprim    Pregabalin Anxiety    Lyrica     Allergies as of 06/17/2023       Reactions   Morphine Other (See Comments)   Medication did not work for pain    Clindamycin/lincomycin    Penicillins Other (See Comments)   UNSPECIFIED REACTION  Unknown reaction as a child.  Tolerated Zosyn 2017. Tolerated Cephalosporin Date: 02/17/22.   Sulfamethoxazole-trimethoprim    Pregabalin Anxiety   Lyrica  Medication List        Accurate as of June 17, 2023  4:31 PM. If you have any questions, ask your nurse or doctor.          amLODipine 5 MG tablet Commonly known as: NORVASC Take 1 tablet (5 mg total) by mouth daily. Started by: Elisama Thissen X Kariss Longmire   atorvastatin 80 MG tablet Commonly known as: LIPITOR TAKE 1 TABLET(80 MG) BY MOUTH DAILY   Biotrue Soln Place 1 drop into both eyes as needed (dry eyes).   budesonide-formoterol 80-4.5 MCG/ACT inhaler Commonly known as: SYMBICORT Inhale 2 puffs into the lungs in the morning and at bedtime. Also uses as needed for emergency purposes   carvedilol 12.5 MG tablet Commonly known as: COREG TAKE 1 TABLET BY MOUTH 2 TIMES A DAY   cetirizine 10 MG tablet Commonly known as: ZYRTEC Take 10 mg by mouth at bedtime.   clopidogrel 75 MG tablet Commonly known as: PLAVIX Take 1 tablet (75 mg total) by mouth daily.   ipratropium 0.03 % nasal spray Commonly known as: ATROVENT Place 2 sprays into both nostrils 3 (three) times daily as needed for rhinitis.   levothyroxine 25 MCG tablet Commonly known as: SYNTHROID Take 1 tablet (25 mcg total) by mouth daily.   lisinopril 40 MG tablet Commonly known as: ZESTRIL Take 40 mg by mouth daily.   MULTIVITAMIN WOMEN 50+ PO Take 1 tablet by mouth daily.   nitroGLYCERIN 0.4 MG SL tablet Commonly known as: NITROSTAT Place 1 tablet (0.4 mg total) under the tongue as needed.   OVER THE COUNTER MEDICATION Apply 1 Application topically as needed  (pain). Magne Sport Balm   pantoprazole 40 MG tablet Commonly known as: PROTONIX Take 1 tablet (40 mg total) by mouth 2 (two) times daily before a meal.   Pepcid AC 10 MG tablet Generic drug: famotidine Take 10 mg by mouth daily as needed for heartburn or indigestion.   Phazyme Ultimate 500 MG Caps Generic drug: Simethicone Take 500 mg by mouth daily as needed (bloating).   pregabalin 25 MG capsule Commonly known as: Lyrica Take 1 capsule (25 mg total) by mouth daily.   sucralfate 1 g tablet Commonly known as: Carafate Take your Protonix at least 30 minutes prior to your first morning dose of Carafate.        Review of Systems:  Review of Systems  Constitutional:  Negative for appetite change, fatigue and unexpected weight change.  HENT:  Negative for congestion.   Eyes:  Negative for visual disturbance.  Respiratory:  Negative for chest tightness and shortness of breath.        DOE occasionally.   Cardiovascular:  Negative for chest pain, palpitations and leg swelling.  Gastrointestinal:  Negative for abdominal pain and constipation.       Hx of colon abscess  Genitourinary:  Negative for dysuria, frequency and urgency.       Bathroom trip x1/night, s/p hysterectomy, hx of kidney stones.   Musculoskeletal:  Positive for arthralgias. Negative for gait problem.       Left knee pain, ortho scar tissue, resolved  Skin:  Negative for color change.  Neurological:  Positive for numbness. Negative for dizziness, speech difficulty, weakness and headaches.       Tingling, numbness   Psychiatric/Behavioral:  Negative for behavioral problems and sleep disturbance. The patient is nervous/anxious.     Health Maintenance  Topic Date Due   OPHTHALMOLOGY EXAM  Never done   Diabetic kidney evaluation -  Urine ACR  Never done   Zoster Vaccines- Shingrix (1 of 2) Never done   DEXA SCAN  Never done   Pneumonia Vaccine 32+ Years old (2 of 2 - PCV) 09/17/2022   Medicare Annual Wellness  (AWV)  01/01/2023   COVID-19 Vaccine (10 - 2023-24 season) 06/28/2023   HEMOGLOBIN A1C  10/04/2023   DTaP/Tdap/Td (2 - Td or Tdap) 02/22/2024   Diabetic kidney evaluation - eGFR measurement  05/31/2024   FOOT EXAM  06/16/2024   INFLUENZA VACCINE  Completed   HPV VACCINES  Aged Out    Physical Exam: Vitals:   06/17/23 0839 06/17/23 1540  BP: (!) 160/90 (!) 154/90  Pulse: 87   Resp: 20   Temp: (!) 97.4 F (36.3 C)   SpO2: 98%   Weight: 195 lb 9.6 oz (88.7 kg)   Height: 5\' 5"  (1.651 m)    Body mass index is 32.55 kg/m. Physical Exam Vitals and nursing note reviewed.  Constitutional:      Appearance: Normal appearance.  HENT:     Head: Normocephalic and atraumatic.     Nose: Nose normal.     Mouth/Throat:     Mouth: Mucous membranes are moist.  Eyes:     Extraocular Movements: Extraocular movements intact.     Conjunctiva/sclera: Conjunctivae normal.     Pupils: Pupils are equal, round, and reactive to light.  Cardiovascular:     Rate and Rhythm: Normal rate and regular rhythm.     Heart sounds: No murmur heard. Pulmonary:     Effort: Pulmonary effort is normal.     Breath sounds: No rales.  Abdominal:     General: Bowel sounds are normal.     Palpations: Abdomen is soft.     Tenderness: There is no abdominal tenderness.  Musculoskeletal:     Cervical back: Normal range of motion and neck supple.     Right lower leg: No edema.     Left lower leg: No edema.     Comments: Pain on the top of left knee-resolved, s/p replacement. S/p left hip replacement. R shoulder limited ROM-opted out surgical intervention.   Skin:    General: Skin is warm and dry.  Neurological:     General: No focal deficit present.     Mental Status: She is alert and oriented to person, place, and time. Mental status is at baseline.     Gait: Gait normal.  Psychiatric:        Mood and Affect: Mood normal.        Behavior: Behavior normal.        Thought Content: Thought content normal.         Judgment: Judgment normal.     Labs reviewed: Basic Metabolic Panel: Recent Labs    01/12/23 0710 01/19/23 0734 04/06/23 0729 06/01/23 0705  NA 141  --   --  141  K 4.0  --   --  4.2  CL 104  --   --  104  CO2 28  --   --  24  GLUCOSE 121*  --   --  128*  BUN 20  --   --  27*  CREATININE 0.65  --   --  0.66  CALCIUM 9.5  --   --  9.4  TSH 6.27* 4.64* 5.29*  --    Liver Function Tests: Recent Labs    01/12/23 0710  AST 16  ALT 16  BILITOT 0.9  PROT 6.6  No results for input(s): "LIPASE", "AMYLASE" in the last 8760 hours. No results for input(s): "AMMONIA" in the last 8760 hours. CBC: Recent Labs    01/12/23 0710 06/01/23 0705  WBC 6.7 6.7  NEUTROABS 3,926 4,033  HGB 13.0 12.9  HCT 39.2 39.4  MCV 90.5 91.8  PLT 169 181   Lipid Panel: Recent Labs    01/12/23 0710  CHOL 166  HDL 53  LDLCALC 83  TRIG 201*  CHOLHDL 3.1   Lab Results  Component Value Date   HGBA1C 6.8 (H) 04/06/2023    Procedures since last visit: No results found.  Assessment/Plan  Type 2 diabetes mellitus with cardiac complication (HCC) A1c 6.4 01/12/23, 6.4 01/19/23, 6.8 04/06/23 06/17/23 foot exm: done  CMP/eGFR, urine ACR, TSH, Hgb A1c, Vit B12, lipids 07/22/23  Elevated TSH TSH 6.27 01/12/23>>4.64 01/19/23<<5.29 04/06/23  Dyslipidemia, goal LDL below 70 Stopped taking statin,  LDL 83 01/12/23  Neuropathy  off Gabapentin, placed on Lyrica per patient's request.   Osteoarthritis, multiple sites  s/p L knee replacement, R hip, L shoulder pain-limited ROM, s/p Ortho, no further surgery, on Tramadol. Vit D 50 01/12/23  CAD S/P percutaneous coronary angioplasty  s/p CABG x3 11/21/18, (prior stent drug eluting), hx of tachycardia, on oral nitrate, Plavix, Carvedilol, Lisinopril, followed by cardiology.   GERD (gastroesophageal reflux disease) on Famotidine, Pantoprazole, Sucralfate, Hgb 13 01/12/23  Asthma Symbicort, Atrovent nasal spray  Essential hypertension Blood pressure  is not controlled,  taking Carvedilol, Lisinopril. Bun/creat 27/0.66 06/01/23, add Amlodipine 5mg  qd  Hypothyroidism on Levothyroxine, TSH 5.29 04/06/23, f/u TSH 07/22/23   Labs/tests ordered:  CMP/eGFR, urine ACR, TSH, Hgb A1c, Vit B12, lipids 07/22/23  Next appt:  3 months.

## 2023-06-17 NOTE — Assessment & Plan Note (Signed)
off Gabapentin, placed on Lyrica per patient's request.

## 2023-06-17 NOTE — Assessment & Plan Note (Signed)
Symbicort, Atrovent nasal spray

## 2023-06-17 NOTE — Assessment & Plan Note (Signed)
s/p L knee replacement, R hip, L shoulder pain-limited ROM, s/p Ortho, no further surgery, on Tramadol. Vit D 50 01/12/23

## 2023-06-17 NOTE — Assessment & Plan Note (Signed)
on Famotidine, Pantoprazole, Sucralfate, Hgb 13 01/12/23

## 2023-06-17 NOTE — Assessment & Plan Note (Signed)
s/p CABG x3 11/21/18, (prior stent drug eluting), hx of tachycardia, on oral nitrate, Plavix, Carvedilol, Lisinopril, followed by cardiology.

## 2023-06-18 NOTE — Telephone Encounter (Signed)
Patient has request refill on medication Lyrica. Patient medication last refilled 05/31/2023. Patient medication pend and sent to PCP Mast, Man X, NP

## 2023-07-13 ENCOUNTER — Other Ambulatory Visit: Payer: Self-pay | Admitting: Nurse Practitioner

## 2023-07-13 ENCOUNTER — Other Ambulatory Visit: Payer: Self-pay

## 2023-07-13 ENCOUNTER — Other Ambulatory Visit (HOSPITAL_COMMUNITY): Payer: Self-pay

## 2023-07-13 DIAGNOSIS — I10 Essential (primary) hypertension: Secondary | ICD-10-CM

## 2023-07-13 MED ORDER — PREGABALIN 25 MG PO CAPS
25.0000 mg | ORAL_CAPSULE | Freq: Every day | ORAL | 5 refills | Status: DC
  Filled 2023-07-13: qty 30, 30d supply, fill #0

## 2023-07-13 MED ORDER — PREGABALIN 25 MG PO CAPS: 25.0000 mg | ORAL_CAPSULE | Freq: Every day | ORAL | 5 refills | Status: DC

## 2023-07-13 NOTE — Telephone Encounter (Signed)
PCP send message to resend Lyrica 25 mg to correct pharmacy. Medication was resend to pharmacy.

## 2023-07-13 NOTE — Addendum Note (Signed)
Addended by: Margo Common on: 07/13/2023 04:43 PM   Modules accepted: Orders

## 2023-07-13 NOTE — Telephone Encounter (Signed)
Patient has request refill on medication Amlodipine. Patient was only given 30 day supply on medication. Pend and sent to PCP Mast, Man X, NP

## 2023-07-13 NOTE — Telephone Encounter (Signed)
High risk or very high risk warning populated when attempting to refill medication. RX request sent to PCP for review and approval if warranted.

## 2023-07-14 ENCOUNTER — Other Ambulatory Visit: Payer: Self-pay

## 2023-07-14 ENCOUNTER — Encounter (INDEPENDENT_AMBULATORY_CARE_PROVIDER_SITE_OTHER): Payer: Self-pay | Admitting: Otolaryngology

## 2023-07-14 DIAGNOSIS — I10 Essential (primary) hypertension: Secondary | ICD-10-CM

## 2023-07-14 NOTE — Telephone Encounter (Signed)
Medication was sent yesterday

## 2023-07-15 MED ORDER — PREGABALIN 25 MG PO CAPS: 25.0000 mg | ORAL_CAPSULE | Freq: Every day | ORAL | 5 refills | Status: DC

## 2023-07-22 ENCOUNTER — Other Ambulatory Visit: Payer: Medicare Other

## 2023-07-30 ENCOUNTER — Encounter: Payer: Self-pay | Admitting: Sports Medicine

## 2023-07-30 ENCOUNTER — Non-Acute Institutional Stay: Payer: Medicare Other | Admitting: Sports Medicine

## 2023-07-30 VITALS — BP 132/78 | HR 96 | Temp 96.2°F | Resp 18 | Ht 65.0 in | Wt 194.0 lb

## 2023-07-30 DIAGNOSIS — E039 Hypothyroidism, unspecified: Secondary | ICD-10-CM | POA: Diagnosis not present

## 2023-07-30 DIAGNOSIS — R635 Abnormal weight gain: Secondary | ICD-10-CM

## 2023-07-30 DIAGNOSIS — I10 Essential (primary) hypertension: Secondary | ICD-10-CM | POA: Diagnosis not present

## 2023-07-30 DIAGNOSIS — H00011 Hordeolum externum right upper eyelid: Secondary | ICD-10-CM | POA: Diagnosis not present

## 2023-07-30 NOTE — Progress Notes (Signed)
Careteam: Patient Care Team: Mast, Man X, NP as PCP - General (Internal Medicine) Allyson Sabal Delton See, MD as PCP - Cardiology (Cardiology)  PLACE OF SERVICE:  [facility]  Advanced Directive information    Allergies  Allergen Reactions   Morphine Other (See Comments)    Medication did not work for pain    Clindamycin/Lincomycin    Penicillins Other (See Comments)    UNSPECIFIED REACTION  Unknown reaction as a child.  Tolerated Zosyn 2017. Tolerated Cephalosporin Date: 02/17/22.     Sulfamethoxazole-Trimethoprim    Pregabalin Anxiety    Lyrica     Chief Complaint  Patient presents with   Acute Visit    Eye concerns-swelling and redness   Discussed the use of AI scribe software for clinical note transcription with the patient, who gave verbal consent to proceed.             HPI: Patient is a 82 y.o. female presented to clinic for acute visit.   The patient presented to the clinic with a chief complaint of a swollen and red eyelid that has persisted for approximately two weeks. The patient reported that the condition has fluctuated, improving and worsening intermittently, but has not completely resolved. She has been using cold compresses on the advice of a pharmacist and has also been using prescribed eye drops for dry eyes. The patient noted that the condition worsened when she switched from one type of eye drop to another. The patient reported significant tearing and crusting in the morning, but denied any yellowish discharge or pain in the eye. The patient's vision has been somewhat affected due to the drooping of the eyelid.  In addition to the eye complaint, the patient reported a recent bout of a stomach bug, which caused significant vomiting but no diarrhea. The patient reported that her appetite has returned and she is not experiencing any dizziness or issues with urination. The patient also reported a recent weight gain that has not been resolving despite dietary  changes and physical therapy. The patient has a history of thyroid issues and is on medication, which she reported has made a significant difference in her energy levels. However, she noted a recent decrease in energy around the same time the eye issue began. The patient is also on blood pressure medication, which she reported seems to be working well.  Review of Systems:  Review of Systems  Constitutional:  Negative for chills and fever.  HENT:  Negative for congestion and sore throat.   Eyes:  Negative for double vision and discharge.       Swelling of rt eye lid  Respiratory:  Negative for cough, sputum production and shortness of breath.   Cardiovascular:  Negative for chest pain, palpitations and leg swelling.  Gastrointestinal:  Negative for abdominal pain, heartburn and nausea.  Genitourinary:  Negative for dysuria, frequency and hematuria.  Musculoskeletal:  Negative for falls and myalgias.  Neurological:  Negative for dizziness, sensory change and focal weakness.   Negative unless indicated in HPI.   Past Medical History:  Diagnosis Date   Anemia     mild as per PCP   Anginal pain (HCC)    06/20/20, relieved    Arthritis    hip/ s/p recent shoulder fracture 8/12- RIGHT   Asthma    Coronary artery disease 05/30/2018   LAD PCI/DES   GI bleeding    Hiatal hernia    History of kidney stones    Last year had surgery  Humerus fracture    with wrist on right side   Hypertension    hypercholesterolemia/  EKG on chart with clearance and note 06/18/11  Mazzocchi   Pneumonia    Pre-diabetes    Past Surgical History:  Procedure Laterality Date   ABDOMINAL HYSTERECTOMY     COLECTOMY WITH COLOSTOMY CREATION/HARTMANN PROCEDURE N/A 05/07/2016   Procedure: COLOSTOMY CREATION/HARTMANN PROCEDURE;  Surgeon: Romie Levee, MD;  [facility]: [facility];  Service: General;  Laterality: N/A;   COLONOSCOPY N/A 08/26/2016   Procedure: COLONOSCOPY;  Surgeon: Romie Levee, MD;  [facility];  Service: Endoscopy;  Laterality: N/A;   COLOSTOMY TAKEDOWN N/A 08/27/2016   Procedure: LAPAROSCOPIC COLOSTOMY REVERSAL;  Surgeon: Romie Levee, MD;  [facility]: [facility];  Service: General;  Laterality: N/A;   CORONARY ARTERY BYPASS GRAFT N/A 11/21/2018   Procedure: CORONARY ARTERY BYPASS GRAFTING (CABG) x Three , using left internal mammary artery and right leg greater saphenous vein harvested endoscopically;  Surgeon: Alleen Borne, MD;  [facility];  Service: Open Heart Surgery;  Laterality: N/A;   CORONARY STENT INTERVENTION N/A 05/30/2018   Procedure: CORONARY STENT INTERVENTION;  Surgeon: Runell Gess, MD;  [facility];  Service: Cardiovascular;  Laterality: N/A;   CYSTOSCOPY WITH RETROGRADE PYELOGRAM, URETEROSCOPY AND STENT PLACEMENT Bilateral 06/21/2020   Procedure: CYSTOSCOPY WITH RETROGRADE PYELOGRAM, AND BILATERAL STENT PLACEMENT;  Surgeon: Sebastian Ache, MD;  [facility]: [facility];  Service: Urology;  Laterality: Bilateral;   CYSTOSCOPY WITH RETROGRADE PYELOGRAM, URETEROSCOPY AND STENT PLACEMENT Bilateral 07/12/2020   Procedure: CYSTOSCOPY WITH RETROGRADE PYELOGRAM, URETEROSCOPY AND STENT PLACEMENT;  Surgeon: Crist Fat, MD;  [facility];  Service: Urology;  Laterality: Bilateral;  2 HRS   EYE SURGERY     eyelid tuck bilateral   HOLMIUM LASER APPLICATION Bilateral 07/12/2020   Procedure: HOLMIUM LASER APPLICATION;  Surgeon: Crist Fat, MD;  [facility];  Service: Urology;  Laterality: Bilateral;   LAPAROTOMY N/A 05/07/2016   Procedure: EXPLORATORY LAPAROTOMY, LYLSIS OF ADHSIONS, Pam Drown OF PERITONEAL ABSCESS;  Surgeon: Romie Levee, MD;  [facility];  Service: General;  Laterality: N/A;   LEFT HEART CATH AND CORONARY ANGIOGRAPHY N/A 05/30/2018   Procedure: LEFT HEART CATH AND CORONARY ANGIOGRAPHY;  Surgeon: Runell Gess, MD;  [facility];  Service: Cardiovascular;  Laterality: N/A;   LEFT HEART  CATH AND CORONARY ANGIOGRAPHY N/A 11/17/2018   Procedure: LEFT HEART CATH AND CORONARY ANGIOGRAPHY;  Surgeon: Runell Gess, MD;  [facility];  Service: Cardiovascular;  Laterality: N/A;   PARATHYROIDECTOMY     one lobe- benign per pt   TEE WITHOUT CARDIOVERSION N/A 11/21/2018   Procedure: TRANSESOPHAGEAL ECHOCARDIOGRAM (TEE);  Surgeon: Alleen Borne, MD;  Location: [facility] OR;  Service: Open Heart Surgery;  Laterality: N/A;   TONSILLECTOMY     TOTAL HIP ARTHROPLASTY  07/08/2011   Procedure: TOTAL HIP ARTHROPLASTY;  Surgeon: Gus Rankin Aluisio;  [facility];  Service: Orthopedics;  Laterality: Right;   TOTAL KNEE ARTHROPLASTY Left 02/16/2022   Procedure: TOTAL KNEE ARTHROPLASTY;  Surgeon: Ollen Gross, MD;  [facility];  Service: Orthopedics;  Laterality: Left;   Social History:   reports that she has never smoked. She has never used smokeless tobacco. She reports current alcohol use. She reports that she does not use drugs.  Family History  Problem Relation Age of Onset   CAD Father 83       PCI   Liver  disease Neg Hx    Esophageal cancer Neg Hx    Colon cancer Neg Hx     Medications: Patient's Medications  New Prescriptions   No medications on file  Previous Medications   AMLODIPINE (NORVASC) 5 MG TABLET    TAKE 1 TABLET BY MOUTH DAILY   ATORVASTATIN (LIPITOR) 80 MG TABLET    TAKE 1 TABLET(80 MG) BY MOUTH DAILY   BUDESONIDE-FORMOTEROL (SYMBICORT) 80-4.5 MCG/ACT INHALER    Inhale 2 puffs into the lungs in the morning and at bedtime. Also uses as needed for emergency purposes   CARVEDILOL (COREG) 12.5 MG TABLET    TAKE 1 TABLET BY MOUTH 2 TIMES A DAY   CETIRIZINE (ZYRTEC) 10 MG TABLET    Take 10 mg by mouth at bedtime.   CLOPIDOGREL (PLAVIX) 75 MG TABLET    Take 1 tablet (75 mg total) by mouth daily.   FAMOTIDINE (PEPCID AC) 10 MG TABLET    Take 10 mg by mouth daily as needed for heartburn or indigestion.   IPRATROPIUM (ATROVENT) 0.03 % NASAL SPRAY    Place  2 sprays into both nostrils 3 (three) times daily as needed for rhinitis.   LEVOTHYROXINE (SYNTHROID) 25 MCG TABLET    Take 1 tablet (25 mcg total) by mouth daily.   LISINOPRIL (PRINIVIL,ZESTRIL) 40 MG TABLET    Take 40 mg by mouth daily.   MULTIPLE VITAMINS-MINERALS (MULTIVITAMIN WOMEN 50+ PO)    Take 1 tablet by mouth daily.   NITROGLYCERIN (NITROSTAT) 0.4 MG SL TABLET    Place 1 tablet (0.4 mg total) under the tongue as needed.   OVER THE COUNTER MEDICATION    Apply 1 Application topically as needed (pain). Magne Sport Balm   PANTOPRAZOLE (PROTONIX) 40 MG TABLET    Take 1 tablet (40 mg total) by mouth 2 (two) times daily before a meal.   PREGABALIN (LYRICA) 25 MG CAPSULE    Take 1 capsule (25 mg total) by mouth daily.   SIMETHICONE (PHAZYME ULTIMATE) 500 MG CAPS    Take 500 mg by mouth daily as needed (bloating).   SOFT LENS PRODUCTS (BIOTRUE) SOLN    Place 1 drop into both eyes as needed (dry eyes).   SUCRALFATE (CARAFATE) 1 G TABLET    Take your Protonix at least 30 minutes prior to your first morning dose of Carafate.  Modified Medications   No medications on file  Discontinued Medications   No medications on file    Physical Exam: Vitals:   07/30/23 0841  BP: 132/78  Pulse: 96  Resp: 18  Temp: (!) 96.2 F (35.7 C)  SpO2: 99%  Weight: 194 lb (88 kg)  Height: 5\' 5"  (1.651 m)   Body mass index is 32.28 kg/m. BP Readings from Last 3 Encounters:  07/30/23 132/78  06/17/23 (!) 154/90  05/14/23 (!) 160/98   Wt Readings from Last 3 Encounters:  07/30/23 194 lb (88 kg)  06/17/23 195 lb 9.6 oz (88.7 kg)  05/14/23 187 lb 9.6 oz (85.1 kg)    Physical Exam Constitutional:      Appearance: Normal appearance.  HENT:     Head: Normocephalic and atraumatic.  Eyes:     Comments: Stye on Rt upper eye lid No conjunctival redness, no discharge  Cardiovascular:     Rate and Rhythm: Normal rate and regular rhythm.  Pulmonary:     Effort: Pulmonary effort is normal. No respiratory  distress.     Breath sounds: Normal breath sounds. No wheezing.  Abdominal:     General: Bowel sounds are normal. There is no distension.     Tenderness: There is no abdominal tenderness. There is no guarding or rebound.     Comments:    Musculoskeletal:        General: No swelling or tenderness.  Neurological:     Mental Status: She is alert. Mental status is at baseline.     Sensory: No sensory deficit.     Motor: No weakness.     Labs reviewed: Basic Metabolic Panel: Recent Labs    01/12/23 0710 01/19/23 0734 04/06/23 0729 06/01/23 0705  NA 141  --   --  141  K 4.0  --   --  4.2  CL 104  --   --  104  CO2 28  --   --  24  GLUCOSE 121*  --   --  128*  BUN 20  --   --  27*  CREATININE 0.65  --   --  0.66  CALCIUM 9.5  --   --  9.4  TSH 6.27* 4.64* 5.29*  --    Liver Function Tests: Recent Labs    01/12/23 0710  AST 16  ALT 16  BILITOT 0.9  PROT 6.6   No results for input(s): "LIPASE", "AMYLASE" in the last 8760 hours. No results for input(s): "AMMONIA" in the last 8760 hours. CBC: Recent Labs    01/12/23 0710 06/01/23 0705  WBC 6.7 6.7  NEUTROABS 3,926 4,033  HGB 13.0 12.9  HCT 39.2 39.4  MCV 90.5 91.8  PLT 169 181   Lipid Panel: Recent Labs    01/12/23 0710  CHOL 166  HDL 53  LDLCALC 83  TRIG 201*  CHOLHDL 3.1   TSH: Recent Labs    01/12/23 0710 01/19/23 0734 04/06/23 0729  TSH 6.27* 4.64* 5.29*   A1C: Lab Results  Component Value Date   HGBA1C 6.8 (H) 04/06/2023     Assessment/Plan  Eyelid Swelling (Stye) Persistent swelling and redness of the eyelid for two weeks with intermittent improvement. No yellow discharge or pain. Vision affected due to drooping of the eyelid. -Use warm compresses 5-6 times a day. -If no improvement in 2-3 days, follow up with ophthalomology  Hypothyroidism Reports significant improvement in energy levels since starting thyroid medication. Recent decrease in energy levels may be related to recent  illness. -Continue current thyroid medication regimen.  Weight Gain Concerns about weight gain despite dietary changes and physical activity. Previous lab work indicated prediabetes. -Continue current efforts with diet and exercise. -Schedule lab work to reassess glucose levels and other metabolic parameters.  Recent Illness Recovery from recent illness with vomiting. No current symptoms. -Continue supportive care as needed.  Hypertension Blood pressure well-controlled on current medication. -Continue current antihypertensive medication regimen.  Follow-up No future appointment currently scheduled. -Schedule follow-up appointment and lab work.  No follow-ups on file.:

## 2023-08-03 ENCOUNTER — Encounter: Payer: Self-pay | Admitting: Sports Medicine

## 2023-08-03 ENCOUNTER — Ambulatory Visit (INDEPENDENT_AMBULATORY_CARE_PROVIDER_SITE_OTHER): Payer: Medicare Other | Admitting: Sports Medicine

## 2023-08-03 VITALS — BP 122/62 | HR 87 | Temp 97.5°F | Resp 17 | Ht 65.0 in | Wt 194.8 lb

## 2023-08-03 DIAGNOSIS — R0602 Shortness of breath: Secondary | ICD-10-CM | POA: Diagnosis not present

## 2023-08-03 DIAGNOSIS — J4541 Moderate persistent asthma with (acute) exacerbation: Secondary | ICD-10-CM

## 2023-08-03 DIAGNOSIS — R051 Acute cough: Secondary | ICD-10-CM | POA: Diagnosis not present

## 2023-08-03 MED ORDER — ALBUTEROL SULFATE HFA 108 (90 BASE) MCG/ACT IN AERS: 2.0000 | INHALATION_SPRAY | Freq: Four times a day (QID) | RESPIRATORY_TRACT | 2 refills | Status: AC | PRN

## 2023-08-03 MED ORDER — PREDNISONE 20 MG PO TABS: 40.0000 mg | ORAL_TABLET | Freq: Every day | ORAL | 0 refills | Status: DC

## 2023-08-03 MED ORDER — BENZONATATE 200 MG PO CAPS: 200.0000 mg | ORAL_CAPSULE | Freq: Two times a day (BID) | ORAL | 0 refills | Status: DC | PRN

## 2023-08-03 NOTE — Progress Notes (Addendum)
 Careteam: Patient Care Team: Mast, Man X, NP as PCP - General (Internal Medicine) Court Dorn PARAS, MD as PCP - Cardiology (Cardiology)  PL[facility] SERVICE[facility]  [facility]  Advanced Directive information Does Patient Have a Medical Advance Directive?: Yes, Type of Advance Directive: Healthcare Power of Angels;Living will, Does patient want to make changes to medical advance directive?: No - Patient declined  Allergies  Allergen Reactions   Morphine  Other (See Comments)    Medication did not work for pain    Clindamycin /Lincomycin    Penicillins Other (See Comments)    UNSPECIFIED REACTION  Unknown reaction as a child.  Tolerated Zosyn  2017. Tolerated Cephalosporin Date: 02/17/22.     Sulfamethoxazole-Trimethoprim    Pregabalin  Anxiety    Lyrica      Chief Complaint  Patient presents with   Acute Visit    Patient has asthma concerns.    Discussed the use of AI scribe software for clinical note transcription with the patient, who gave verbal consent to proceed.         HPI: Patient is a 82 y.o. female presented to clinic for acute visit for wheezing  The patient, with a history of asthma, presented with acute onset of respiratory symptoms that began the previous night.   C/o  coughing, sneezing, and shortness of breath.  Despite using their inhaler multiple times, the patient noted no significant improvement in their symptoms.  The patient also reported wheezing and production of clear sputum, which later turned yellowish-green.  The patient's asthma has been well-controlled for approximately 20 years with the use of an inhaler four times a day. She  reports using a nasal spray and treatment for dry eyes concurrently. The patient has not had an asthma flare-up in the past year.  Denied any associated symptoms such as fever, body aches, diarrhea, or urinary symptoms. Denied any recent exposure to potential triggers for their asthma, such as allergens or irritants.  The  patient had recently recovered from a stomach bug two weeks prior. They also reported having dealt with thyroid issues and dry eyes, but these conditions were currently well-managed. The patient planned to schedule an appointment with their cardiologist due to the recent onset of shortness of breath.   Review of Systems:  Review of Systems  Constitutional:  Negative for chills and fever.  HENT:  Negative for congestion, ear pain, sinus pain and sore throat.   Eyes:  Negative for double vision.  Respiratory:  Positive for cough, sputum production, shortness of breath and wheezing.   Cardiovascular:  Negative for chest pain, palpitations and leg swelling.  Gastrointestinal:  Negative for abdominal pain, diarrhea, heartburn and nausea.  Genitourinary:  Negative for dysuria, frequency and hematuria.  Musculoskeletal:  Negative for falls and myalgias.  Neurological:  Negative for dizziness, sensory change and focal weakness.   Negative unless indicated in HPI.   Past Medical History:  Diagnosis Date   Anemia     mild as per PCP   Anginal pain (HCC)    06/20/20, relieved    Arthritis    hip/ s/p recent shoulder fracture 8/12- RIGHT   Asthma    Coronary artery disease 05/30/2018   LAD PCI/DES   GI bleeding    Hiatal hernia    History of kidney stones    Last year had surgery   Humerus fracture    with wrist on right side   Hypertension    hypercholesterolemia/  EKG on chart with clearance and note 06/18/11  Mazzocchi   Pneumonia    Pre-diabetes    Past Surgical History:  Procedure Laterality Date   ABDOMINAL HYSTERECTOMY     COLECTOMY WITH COLOSTOMY CREATION/HARTMANN PROCEDURE N/A 05/07/2016   Procedure: COLOSTOMY CREATION/HARTMANN PROCEDURE;  Surgeon: Bernarda Ned, MD;  [facility];  Service: General;  Laterality: N/A;   COLONOSCOPY N/A 08/26/2016   Procedure: COLONOSCOPY;  Surgeon: Bernarda Ned, MD;  [facility];  Service: Endoscopy;  Laterality: N/A;    COLOSTOMY TAKEDOWN N/A 08/27/2016   Procedure: LAPAROSCOPIC COLOSTOMY REVERSAL;  Surgeon: Bernarda Ned, MD;  [facility];  Service: General;  Laterality: N/A;   CORONARY ARTERY BYPASS GRAFT N/A 11/21/2018   Procedure: CORONARY ARTERY BYPASS GRAFTING (CABG) x Three , using left internal mammary artery and right leg greater saphenous vein harvested endoscopically;  Surgeon: Lucas Dorise POUR, MD;  [facility];  Service: Open Heart Surgery;  Laterality: N/A;   CORONARY STENT INTERVENTION N/A 05/30/2018   Procedure: CORONARY STENT INTERVENTION;  Surgeon: Court Dorn PARAS, MD;  [facility];  Service: Cardiovascular;  Laterality: N/A;   CYSTOSCOPY WITH RETROGRADE PYELOGRAM, URETEROSCOPY AND STENT PLACEMENT Bilateral 06/21/2020   Procedure: CYSTOSCOPY WITH RETROGRADE PYELOGRAM, AND BILATERAL STENT PLACEMENT;  Surgeon: Alvaro Hummer, MD;  [facility];  Service: Urology;  Laterality: Bilateral;   CYSTOSCOPY WITH RETROGRADE PYELOGRAM, URETEROSCOPY AND STENT PLACEMENT Bilateral 07/12/2020   Procedure: CYSTOSCOPY WITH RETROGRADE PYELOGRAM, URETEROSCOPY AND STENT PLACEMENT;  Surgeon: Cam Morene ORN, MD;  [facility];  Service: Urology;  Laterality: Bilateral;  2 HRS   EYE SURGERY     eyelid tuck bilateral   HOLMIUM LASER APPLICATION Bilateral 07/12/2020   Procedure: HOLMIUM LASER APPLICATION;  Surgeon: Cam Morene ORN, MD;  [facility];  Service: Urology;  Laterality: Bilateral;   LAPAROTOMY N/A 05/07/2016   Procedure: EXPLORATORY LAPAROTOMY, LYLSIS OF ADHSIONS, FLO OF PERITONEAL ABSCESS;  Surgeon: Bernarda Ned, MD;  [facility];  Service: General;  Laterality: N/A;   LEFT HEART CATH AND CORONARY ANGIOGRAPHY N/A 05/30/2018   Procedure: LEFT HEART CATH AND CORONARY ANGIOGRAPHY;  Surgeon: Court Dorn PARAS, MD;  [facility];  Service: Cardiovascular;  Laterality: N/A;   LEFT HEART CATH AND CORONARY ANGIOGRAPHY N/A 11/17/2018    Procedure: LEFT HEART CATH AND CORONARY ANGIOGRAPHY;  Surgeon: Court Dorn PARAS, MD;  [facility];  Service: Cardiovascular;  Laterality: N/A;   PARATHYROIDECTOMY     one lobe- benign per pt   TEE WITHOUT CARDIOVERSION N/A 11/21/2018   Procedure: TRANSESOPHAGEAL ECHOCARDIOGRAM (TEE);  Surgeon: Lucas Dorise POUR, MD;  Location: [facility] OR;  Service: Open Heart Surgery;  Laterality: N/A;   TONSILLECTOMY     TOTAL HIP ARTHROPLASTY  07/08/2011   Procedure: TOTAL HIP ARTHROPLASTY;  Surgeon: Dempsey GAILS Aluisio;  [facility];  Service: Orthopedics;  Laterality: Right;   TOTAL KNEE ARTHROPLASTY Left 02/16/2022   Procedure: TOTAL KNEE ARTHROPLASTY;  Surgeon: Melodi Dempsey, MD;  [facility];  Service: Orthopedics;  Laterality: Left;   Social History:   reports that she has never smoked. She has never used smokeless tobacco. She reports current alcohol use. She reports that she does not use drugs.  Family History  Problem Relation Age of Onset   CAD Father 76       PCI   Liver disease Neg Hx    Esophageal cancer Neg Hx    Colon cancer Neg Hx     Medications: Patient's Medications  New Prescriptions  No medications on file  Previous Medications   AMLODIPINE  (NORVASC ) 5 MG TABLET    TAKE 1 TABLET BY MOUTH DAILY   ATORVASTATIN  (LIPITOR ) 80 MG TABLET    TAKE 1 TABLET(80 MG) BY MOUTH DAILY   BUDESONIDE-FORMOTEROL  (SYMBICORT) 80-4.5 MCG/ACT INHALER    Inhale 2 puffs into the lungs in the morning and at bedtime. Also uses as needed for emergency purposes   CARVEDILOL  (COREG ) 12.5 MG TABLET    TAKE 1 TABLET BY MOUTH 2 TIMES A DAY   CETIRIZINE (ZYRTEC) 10 MG TABLET    Take 10 mg by mouth at bedtime.   CLOPIDOGREL  (PLAVIX ) 75 MG TABLET    Take 1 tablet (75 mg total) by mouth daily.   FAMOTIDINE  (PEPCID  AC) 10 MG TABLET    Take 10 mg by mouth daily as needed for heartburn or indigestion.   IPRATROPIUM (ATROVENT) 0.03 % NASAL SPRAY    Place 2 sprays into both nostrils 3 (three) times  daily as needed for rhinitis.   LEVOTHYROXINE  (SYNTHROID ) 25 MCG TABLET    Take 1 tablet (25 mcg total) by mouth daily.   LISINOPRIL  (PRINIVIL ,ZESTRIL ) 40 MG TABLET    Take 40 mg by mouth daily.   MULTIPLE VITAMINS-MINERALS (MULTIVITAMIN WOMEN 50+ PO)    Take 1 tablet by mouth daily.   NITROGLYCERIN  (NITROSTAT ) 0.4 MG SL TABLET    Place 1 tablet (0.4 mg total) under the tongue as needed.   OVER THE COUNTER MEDICATION    Apply 1 Application topically as needed (pain). Magne Sport Balm   PANTOPRAZOLE  (PROTONIX ) 40 MG TABLET    Take 1 tablet (40 mg total) by mouth 2 (two) times daily before a meal.   PREGABALIN  (LYRICA ) 25 MG CAPSULE    Take 1 capsule (25 mg total) by mouth daily.   SIMETHICONE  (PHAZYME ULTIMATE) 500 MG CAPS    Take 500 mg by mouth daily as needed (bloating).   SOFT LENS PRODUCTS (BIOTRUE) SOLN    Place 1 drop into both eyes as needed (dry eyes).   SUCRALFATE  (CARAFATE ) 1 G TABLET    Take your Protonix  at least 30 minutes prior to your first morning dose of Carafate .  Modified Medications   No medications on file  Discontinued Medications   No medications on file    Physical Exam: Vitals:   08/03/23 1053  BP: 122/62  Pulse: 87  Resp: 17  Temp: (!) 97.5 F (36.4 C)  SpO2: 96%  Weight: 194 lb 12.8 oz (88.4 kg)  Height: 5' 5 (1.651 m)   Body mass index is 32.42 kg/m. BP Readings from Last 3 Encounters:  08/03/23 122/62  07/30/23 132/78  06/17/23 (!) 154/90   Wt Readings from Last 3 Encounters:  08/03/23 194 lb 12.8 oz (88.4 kg)  07/30/23 194 lb (88 kg)  06/17/23 195 lb 9.6 oz (88.7 kg)    Physical Exam Constitutional:      Appearance: Normal appearance.  HENT:     Head: Normocephalic and atraumatic.     Nose:     Comments: No sinus tenderness    Mouth/Throat:     Comments:  No posterior pharyngeal wall erythema Cardiovascular:     Rate and Rhythm: Normal rate and regular rhythm.  Pulmonary:     Effort: Pulmonary effort is normal. No respiratory  distress.     Breath sounds: Normal breath sounds. No wheezing.  Abdominal:     General: Bowel sounds are normal. There is no distension.  Tenderness: There is no abdominal tenderness. There is no guarding or rebound.     Comments:    Neurological:     Mental Status: She is alert. Mental status is at baseline.     Sensory: No sensory deficit.     Motor: No weakness.     Labs reviewed: Basic Metabolic Panel: Recent Labs    01/12/23 0710 01/19/23 0734 04/06/23 0729 06/01/23 0705  NA 141  --   --  141  K 4.0  --   --  4.2  CL 104  --   --  104  CO2 28  --   --  24  GLUCOSE 121*  --   --  128*  BUN 20  --   --  27*  CREATININE 0.65  --   --  0.66  CALCIUM  9.5  --   --  9.4  TSH 6.27* 4.64* 5.29*  --    Liver Function Tests: Recent Labs    01/12/23 0710  AST 16  ALT 16  BILITOT 0.9  PROT 6.6   No results for input(s): LIPASE, AMYLASE in the last 8760 hours. No results for input(s): AMMONIA in the last 8760 hours. CBC: Recent Labs    01/12/23 0710 06/01/23 0705  WBC 6.7 6.7  NEUTROABS 3,926 4,033  HGB 13.0 12.9  HCT 39.2 39.4  MCV 90.5 91.8  PLT 169 181   Lipid Panel: Recent Labs    01/12/23 0710  CHOL 166  HDL 53  LDLCALC 83  TRIG 201*  CHOLHDL 3.1   TSH: Recent Labs    01/12/23 0710 01/19/23 0734 04/06/23 0729  TSH 6.27* 4.64* 5.29*   A1C: Lab Results  Component Value Date   HGBA1C 6.8 (H) 04/06/2023     Assessment/Plan Asthma exacerbation Pt able to speak in full sentences Does not appear to be in distress O2 sat 94% on RA  Acute onset of cough, wheezing, and shortness of breath. No fever, sore throat, or body aches. No improvement with albuterol  inhaler use. No wheezing on exam, possibly due to recent albuterol  use. -Prescribe Prednisone  40mg  daily. -Prescribe Albuterol  inhaler for rescue use. -Prescribe Tessalon  for cough.  Cough  Lungs clear  Use tessalon  for cough  Oral Thrush Noted on exam, possibly secondary to  inhaled corticosteroid use. -Nystatin  oral rinse.  Follow-up Monitor symptoms. If fever develops or cough worsens, patient should contact the clinic.  No follow-ups on file.:

## 2023-08-10 ENCOUNTER — Other Ambulatory Visit: Payer: Medicare Other

## 2023-08-11 LAB — LIPID PANEL
Cholesterol: 186 mg/dL (ref <200)
HDL: 53 mg/dL (ref >=50)
LDL Cholesterol (Calc): 106 mg/dL — ABNORMAL HIGH
Non-HDL Cholesterol (Calc): 133 mg/dL — ABNORMAL HIGH (ref <130)
Total CHOL/HDL Ratio: 3.5 (calc) (ref <5.0)
Triglycerides: 154 mg/dL — ABNORMAL HIGH (ref <150)

## 2023-08-11 LAB — CBC WITH DIFFERENTIAL/PLATELET
Absolute Lymphocytes: 2482 {cells}/uL (ref 850–3900)
Absolute Monocytes: 642 {cells}/uL (ref 200–950)
Basophils Absolute: 62 {cells}/uL (ref 0–200)
Basophils Relative: 0.7 %
Eosinophils Absolute: 264 {cells}/uL (ref 15–500)
Eosinophils Relative: 3 %
HCT: 39.1 % (ref 35.0–45.0)
Hemoglobin: 12.8 g/dL (ref 11.7–15.5)
MCH: 29.8 pg (ref 27.0–33.0)
MCHC: 32.7 g/dL (ref 32.0–36.0)
MCV: 90.9 fL (ref 80.0–100.0)
MPV: 9.4 fL (ref 7.5–12.5)
Monocytes Relative: 7.3 %
Neutro Abs: 5350 {cells}/uL (ref 1500–7800)
Neutrophils Relative %: 60.8 %
Platelets: 260 10*3/uL (ref 140–400)
RBC: 4.3 10*6/uL (ref 3.80–5.10)
RDW: 13.1 % (ref 11.0–15.0)
Total Lymphocyte: 28.2 %
WBC: 8.8 10*3/uL (ref 3.8–10.8)

## 2023-08-11 LAB — COMPLETE METABOLIC PANEL WITH GFR
AG Ratio: 2 (calc) (ref 1.0–2.5)
ALT: 28 U/L (ref 6–29)
AST: 18 U/L (ref 10–35)
Albumin: 3.8 g/dL (ref 3.6–5.1)
Alkaline phosphatase (APISO): 63 U/L (ref 37–153)
BUN: 22 mg/dL (ref 7–25)
CO2: 27 mmol/L (ref 20–32)
Calcium: 9.3 mg/dL (ref 8.6–10.4)
Chloride: 104 mmol/L (ref 98–110)
Creat: 0.65 mg/dL (ref 0.60–0.95)
Globulin: 1.9 g/dL (ref 1.9–3.7)
Glucose, Bld: 107 mg/dL — ABNORMAL HIGH (ref 65–99)
Potassium: 3.7 mmol/L (ref 3.5–5.3)
Sodium: 141 mmol/L (ref 135–146)
Total Bilirubin: 0.7 mg/dL (ref 0.2–1.2)
Total Protein: 5.7 g/dL — ABNORMAL LOW (ref 6.1–8.1)
eGFR: 87 mL/min/{1.73_m2} (ref >=60)

## 2023-08-11 LAB — HEMOGLOBIN A1C
Hgb A1c MFr Bld: 6.4 %{Hb} — ABNORMAL HIGH (ref <5.7)
Mean Plasma Glucose: 137 mg/dL
eAG (mmol/L): 7.6 mmol/L

## 2023-08-11 LAB — TSH: TSH: 4.35 m[IU]/L (ref 0.40–4.50)

## 2023-08-11 LAB — VITAMIN B12: Vitamin B-12: 300 pg/mL (ref 200–1100)

## 2023-08-11 LAB — MICROALBUMIN / CREATININE URINE RATIO
Creatinine, Urine: 207 mg/dL (ref 20–275)
Microalb Creat Ratio: 11 mg/g{creat} (ref <30)
Microalb, Ur: 2.3 mg/dL

## 2023-08-12 ENCOUNTER — Non-Acute Institutional Stay: Payer: Medicare Other | Admitting: Nurse Practitioner

## 2023-08-12 ENCOUNTER — Encounter: Payer: Self-pay | Admitting: Nurse Practitioner

## 2023-08-12 VITALS — Resp 20 | Ht 65.0 in | Wt 195.4 lb

## 2023-08-12 DIAGNOSIS — F419 Anxiety disorder, unspecified: Secondary | ICD-10-CM

## 2023-08-12 DIAGNOSIS — I2510 Atherosclerotic heart disease of native coronary artery without angina pectoris: Secondary | ICD-10-CM | POA: Diagnosis not present

## 2023-08-12 DIAGNOSIS — K219 Gastro-esophageal reflux disease without esophagitis: Secondary | ICD-10-CM | POA: Diagnosis not present

## 2023-08-12 DIAGNOSIS — Z9861 Coronary angioplasty status: Secondary | ICD-10-CM

## 2023-08-12 DIAGNOSIS — E1159 Type 2 diabetes mellitus with other circulatory complications: Secondary | ICD-10-CM

## 2023-08-12 DIAGNOSIS — E785 Hyperlipidemia, unspecified: Secondary | ICD-10-CM

## 2023-08-12 DIAGNOSIS — J45909 Unspecified asthma, uncomplicated: Secondary | ICD-10-CM

## 2023-08-12 DIAGNOSIS — G629 Polyneuropathy, unspecified: Secondary | ICD-10-CM

## 2023-08-12 DIAGNOSIS — I10 Essential (primary) hypertension: Secondary | ICD-10-CM

## 2023-08-12 DIAGNOSIS — N132 Hydronephrosis with renal and ureteral calculous obstruction: Secondary | ICD-10-CM

## 2023-08-12 DIAGNOSIS — M150 Primary generalized (osteo)arthritis: Secondary | ICD-10-CM

## 2023-08-12 NOTE — Progress Notes (Signed)
 Location:   clinic [facility]   Place of Service:  Clinic (12) Provider: Larwance Hark NP  Code Status: DNR Goals of Care:     08/12/2023    3:38 PM  Advanced Directives  Does Patient Have a Medical Advance Directive? Yes  Type of Estate Agent of [place];Living will  Does patient want to make changes to medical advance directive? No - Patient declined  Copy of Healthcare Power of Attorney in Chart? Yes - validated most recent copy scanned in chart (See row information)     Chief Complaint  Patient presents with   Acute Visit    Discuss labs.    HPI: Patient is a 83 y.o. female seen today for medical management of chronic diseases.      Hypothyroidism, on Levothyroxine , TSH 4.35 08/10/23             02/12/22 CT abd Cholelithiasis with mild gallbladder wall thickening, LFT wnl 01/12/23. C/o RLQ pain resolves after BM, negative Murphy's sign, denied abd pain associated with nausea, vomiting, she is afebrile.              HTN, controlled, on  Amlodipine , Carvedilol , Lisinopril . Bun/creat 220.65 08/10/23             T2DM, A1c 6.4 08/10/23, ACR 11             HLD, taking Atorvastatin , LDL 106 08/10/23             Neuropathy, off Gabapentin , placed on Lyrica  per patient's request.              Hx of UTI, s/p kidney stones surgical removal.              OA, s/p L knee replacement, R hip, L shoulder pain-limited ROM, s/p Ortho, no further surgery, on Tramadol . Vit D 50 01/12/23             Insomnia, hx of              CAD s/p CABG x3 11/21/18, (prior stent drug eluting), hx of tachycardia, on oral nitrate, Plavix , Carvedilol , Lisinopril , followed by cardiology.              GERD, on Famotidine , Pantoprazole , Sucralfate , Hgb 12.8 08/10/23             Asthma,  Symbicort, Atrovent nasal spray                Past Medical History:  Diagnosis Date   Anemia     mild as per PCP   Anginal pain (HCC)    06/20/20, relieved    Arthritis    hip/ s/p recent shoulder fracture 8/12- RIGHT    Asthma    Coronary artery disease 05/30/2018   LAD PCI/DES   GI bleeding    Hiatal hernia    History of kidney stones    Last year had surgery   Humerus fracture    with wrist on right side   Hypertension    hypercholesterolemia/  EKG on chart with clearance and note 06/18/11  [facility]chi   Pneumonia    Pre-diabetes     Past Surgical History:  Procedure Laterality Date   ABDOMINAL HYSTERECTOMY     COLECTOMY WITH COLOSTOMY CREATION/HARTMANN PROCEDURE N/A 05/07/2016   Procedure: COLOSTOMY CREATION/HARTMANN PROCEDURE;  Surgeon: Bernarda Ned, MD;  Location: [facility];  Service: General;  Laterality: N/A;   COLONOSCOPY N/A 08/26/2016   Procedure: COLONOSCOPY;  Surgeon: Bernarda Ned, MD;  Location: [facility];  Service: Endoscopy;  Laterality: N/A;   COLOSTOMY TAKEDOWN N/A 08/27/2016   Procedure: LAPAROSCOPIC COLOSTOMY REVERSAL;  Surgeon: Bernarda Ned, MD;  Location: [facility];  Service: General;  Laterality: N/A;   CORONARY ARTERY BYPASS GRAFT N/A 11/21/2018   Procedure: CORONARY ARTERY BYPASS GRAFTING (CABG) x Three , using left internal mammary artery and right leg greater saphenous vein harvested endoscopically;  Surgeon: Lucas Dorise POUR, MD;  Location: [facility];  Service: Open Heart Surgery;  Laterality: N/A;   CORONARY STENT INTERVENTION N/A 05/30/2018   Procedure: CORONARY STENT INTERVENTION;  Surgeon: Court Dorn PARAS, MD;  Location: [facility];  Service: Cardiovascular;  Laterality: N/A;   CYSTOSCOPY WITH RETROGRADE PYELOGRAM, URETEROSCOPY AND STENT PLACEMENT Bilateral 06/21/2020   Procedure: CYSTOSCOPY WITH RETROGRADE PYELOGRAM, AND BILATERAL STENT PLACEMENT;  Surgeon: Alvaro Hummer, MD;  Location: [facility];  Service: Urology;  Laterality: Bilateral;   CYSTOSCOPY WITH RETROGRADE PYELOGRAM, URETEROSCOPY AND STENT PLACEMENT Bilateral 07/12/2020   Procedure: CYSTOSCOPY WITH RETROGRADE PYELOGRAM, URETEROSCOPY AND STENT PLACEMENT;  Surgeon: Cam Morene ORN, MD;  Location: [facility];   Service: Urology;  Laterality: Bilateral;  2 HRS   EYE SURGERY     eyelid tuck bilateral   HOLMIUM LASER APPLICATION Bilateral 07/12/2020   Procedure: HOLMIUM LASER APPLICATION;  Surgeon: Cam Morene ORN, MD;  Location: [facility];  Service: Urology;  Laterality: Bilateral;   LAPAROTOMY N/A 05/07/2016   Procedure: EXPLORATORY LAPAROTOMY, LYLSIS OF ADHSIONS, FLO OF PERITONEAL ABSCESS;  Surgeon: Bernarda Ned, MD;  Location: [facility];  Service: General;  Laterality: N/A;   LEFT HEART CATH AND CORONARY ANGIOGRAPHY N/A 05/30/2018   Procedure: LEFT HEART CATH AND CORONARY ANGIOGRAPHY;  Surgeon: Court Dorn PARAS, MD;  [facility]: [facility];  Service: Cardiovascular;  Laterality: N/A;   LEFT HEART CATH AND CORONARY ANGIOGRAPHY N/A 11/17/2018   Procedure: LEFT HEART CATH AND CORONARY ANGIOGRAPHY;  Surgeon: Court Dorn PARAS, MD;  Location: [facility];  Service: Cardiovascular;  Laterality: N/A;   PARATHYROIDECTOMY     one lobe- benign per pt   TEE WITHOUT CARDIOVERSION N/A 11/21/2018   Procedure: TRANSESOPHAGEAL ECHOCARDIOGRAM (TEE);  Surgeon: Lucas Dorise POUR, MD;  Location[facility];  Service: Open Heart Surgery;  Laterality: N/A;   TONSILLECTOMY     TOTAL HIP ARTHROPLASTY  07/08/2011   Procedure: TOTAL HIP ARTHROPLASTY;  Surgeon: Dempsey GAILS Aluisio;  Location: [facility];  Service: Orthopedics;  Laterality: Right;   TOTAL KNEE ARTHROPLASTY Left 02/16/2022   Procedure: TOTAL KNEE ARTHROPLASTY;  Surgeon: Melodi Dempsey, MD;  Location: [facility];  Service: Orthopedics;  Laterality: Left;    Allergies  Allergen Reactions   Morphine  Other (See Comments)    Medication did not work for pain    Clindamycin /Lincomycin    Penicillins Other (See Comments)    UNSPECIFIED REACTION  Unknown reaction as a child.  Tolerated Zosyn  2017. Tolerated Cephalosporin Date: 02/17/22.     Sulfamethoxazole-Trimethoprim    Pregabalin  Anxiety    Lyrica      Allergies as of 08/12/2023       Reactions   Morphine   Other (See Comments)   Medication did not work for pain    Clindamycin /lincomycin    Penicillins Other (See Comments)   UNSPECIFIED REACTION  Unknown reaction as a child.  Tolerated Zosyn  2017. Tolerated Cephalosporin Date: 02/17/22.   Sulfamethoxazole-trimethoprim    Pregabalin  Anxiety   Lyrica          Medication List  Accurate as of August 12, 2023 11:59 PM. If you have any questions, ask your nurse or doctor.          albuterol  108 (90 Base) MCG/ACT inhaler Commonly known as: VENTOLIN  HFA Inhale 2 puffs into the lungs every 6 (six) hours as needed for wheezing or shortness of breath.   amLODipine  5 MG tablet Commonly known as: NORVASC  TAKE 1 TABLET BY MOUTH DAILY   atorvastatin  80 MG tablet Commonly known as: LIPITOR  TAKE 1 TABLET(80 MG) BY MOUTH DAILY   benzonatate  200 MG capsule Commonly known as: TESSALON  Take 1 capsule (200 mg total) by mouth 2 (two) times daily as needed for cough.   Biotrue Soln Place 1 drop into both eyes as needed (dry eyes).   budesonide-formoterol  80-4.5 MCG/ACT inhaler Commonly known as: SYMBICORT Inhale 2 puffs into the lungs in the morning and at bedtime. Also uses as needed for emergency purposes   carvedilol  12.5 MG tablet Commonly known as: COREG  TAKE 1 TABLET BY MOUTH 2 TIMES A DAY   cetirizine 10 MG tablet Commonly known as: ZYRTEC Take 10 mg by mouth at bedtime.   clopidogrel  75 MG tablet Commonly known as: PLAVIX  Take 1 tablet (75 mg total) by mouth daily.   ipratropium 0.03 % nasal spray Commonly known as: ATROVENT Place 2 sprays into both nostrils 3 (three) times daily as needed for rhinitis.   levothyroxine  25 MCG tablet Commonly known as: SYNTHROID  Take 1 tablet (25 mcg total) by mouth daily.   lisinopril  40 MG tablet Commonly known as: ZESTRIL  Take 40 mg by mouth daily.   MULTIVITAMIN WOMEN 50+ PO Take 1 tablet by mouth daily.   nitroGLYCERIN  0.4 MG SL tablet Commonly known as:  NITROSTAT  Place 1 tablet (0.4 mg total) under the tongue as needed.   OVER THE COUNTER MEDICATION Apply 1 Application topically as needed (pain). Magne Sport Balm   pantoprazole  40 MG tablet Commonly known as: PROTONIX  Take 1 tablet (40 mg total) by mouth 2 (two) times daily before a meal.   Pepcid  AC 10 MG tablet Generic drug: famotidine  Take 10 mg by mouth daily as needed for heartburn or indigestion.   Phazyme Ultimate 500 MG Caps Generic drug: Simethicone  Take 500 mg by mouth daily as needed (bloating).   predniSONE  20 MG tablet Commonly known as: DELTASONE  Take 2 tablets (40 mg total) by mouth daily with breakfast.   pregabalin  25 MG capsule Commonly known as: LYRICA  Take 1 capsule (25 mg total) by mouth daily.   sucralfate  1 g tablet Commonly known as: Carafate  Take your Protonix  at least 30 minutes prior to your first morning dose of Carafate .        Review of Systems:  Review of Systems  Constitutional:  Negative for appetite change, fatigue and unexpected weight change.  HENT:  Negative for congestion.   Eyes:  Negative for visual disturbance.  Respiratory:  Negative for chest tightness and shortness of breath.        DOE occasionally.   Cardiovascular:  Negative for chest pain, palpitations and leg swelling.  Gastrointestinal:  Negative for abdominal pain and constipation.       Hx of colon abscess  Genitourinary:  Negative for dysuria, frequency and urgency.       Bathroom trip x1/night, s/p hysterectomy, hx of kidney stones.   Musculoskeletal:  Positive for arthralgias. Negative for gait problem.       Left knee pain, ortho scar tissue, resolved  Skin:  Negative for color  change.  Neurological:  Positive for numbness. Negative for dizziness, speech difficulty, weakness and headaches.       Tingling, numbness   Psychiatric/Behavioral:  Negative for behavioral problems and sleep disturbance. The patient is nervous/anxious.     Health Maintenance  Topic  Date Due   OPHTHALMOLOGY EXAM  Never done   Zoster Vaccines- Shingrix (1 of 2) Never done   DEXA SCAN  Never done   Pneumonia Vaccine 81+ Years old (2 of 2 - PCV) 09/17/2022   Medicare Annual Wellness (AWV)  01/01/2023   COVID-19 Vaccine (10 - 2024-25 season) 06/28/2023   HEMOGLOBIN A1C  02/07/2024   DTaP/Tdap/Td (2 - Td or Tdap) 02/22/2024   FOOT EXAM  06/16/2024   Diabetic kidney evaluation - eGFR measurement  08/09/2024   Diabetic kidney evaluation - Urine ACR  08/09/2024   INFLUENZA VACCINE  Completed   HPV VACCINES  Aged Out    Physical Exam: Vitals:   08/12/23 1542  Resp: 20  Weight: 195 lb 6.4 oz (88.6 kg)  Height: 5' 5 (1.651 m)   Body mass index is 32.52 kg/m. Physical Exam Vitals and nursing note reviewed.  Constitutional:      Appearance: Normal appearance.  HENT:     Head: Normocephalic and atraumatic.     Nose: Nose normal.     Mouth/Throat:     Mouth: Mucous membranes are moist.  Eyes:     Extraocular Movements: Extraocular movements intact.     Conjunctiva/sclera: Conjunctivae normal.     Pupils: Pupils are equal, round, and reactive to light.  Cardiovascular:     Rate and Rhythm: Normal rate and regular rhythm.     Heart sounds: No murmur heard. Pulmonary:     Effort: Pulmonary effort is normal.     Breath sounds: No rales.  Abdominal:     General: Bowel sounds are normal.     Palpations: Abdomen is soft.     Tenderness: There is no abdominal tenderness.  Musculoskeletal:     Cervical back: Normal range of motion and neck supple.     Right lower leg: No edema.     Left lower leg: No edema.     Comments: Pain on the top of left knee-resolved, s/p replacement. S/p left hip replacement. R shoulder limited ROM-opted out surgical intervention.   Skin:    General: Skin is warm and dry.  Neurological:     General: No focal deficit present.     Mental Status: She is alert and oriented to person, place, and time. Mental status is at baseline.      Gait: Gait normal.  Psychiatric:        Mood and Affect: Mood normal.        Behavior: Behavior normal.        Thought Content: Thought content normal.        Judgment: Judgment normal.     Labs reviewed: Basic Metabolic Panel: Recent Labs    01/12/23 0710 01/19/23 0734 04/06/23 0729 06/01/23 0705 08/10/23 0713  NA 141  --   --  141 141  K 4.0  --   --  4.2 3.7  CL 104  --   --  104 104  CO2 28  --   --  24 27  GLUCOSE 121*  --   --  128* 107*  BUN 20  --   --  27* 22  CREATININE 0.65  --   --  0.66 0.65  CALCIUM   9.5  --   --  9.4 9.3  TSH 6.27* 4.64* 5.29*  --  4.35   Liver Function Tests: Recent Labs    01/12/23 0710 08/10/23 0713  AST 16 18  ALT 16 28  BILITOT 0.9 0.7  PROT 6.6 5.7*   No results for input(s): LIPASE, AMYLASE in the last 8760 hours. No results for input(s): AMMONIA in the last 8760 hours. CBC: Recent Labs    01/12/23 0710 06/01/23 0705 08/10/23 0713  WBC 6.7 6.7 8.8  NEUTROABS 3,926 4,033 5,350  HGB 13.0 12.9 12.8  HCT 39.2 39.4 39.1  MCV 90.5 91.8 90.9  PLT 169 181 260   Lipid Panel: Recent Labs    01/12/23 0710 08/10/23 0713  CHOL 166 186  HDL 53 53  LDLCALC 83 106*  TRIG 201* 154*  CHOLHDL 3.1 3.5   Lab Results  Component Value Date   HGBA1C 6.4 (H) 08/10/2023    Procedures since last visit: No results found.  Assessment/Plan  GERD (gastroesophageal reflux disease) Stable,  on Famotidine , Pantoprazole , Sucralfate , Hgb 12.8 08/10/23  Asthma Stable, continue Symbicort, Atrovent nasal spray   CAD S/P percutaneous coronary angioplasty s/p CABG x3 11/21/18, (prior stent drug eluting), hx of tachycardia, on oral nitrate, Plavix , Carvedilol , Lisinopril , followed by cardiology.   Anxiety hx of   Osteoarthritis, multiple sites  s/p L knee replacement, R hip, L shoulder pain-limited ROM, s/p Ortho, no further surgery, on Tramadol . Vit D 50 01/12/23  Ureteral stone with hydronephrosis  s/p kidney stones surgical  removal.   Neuropathy off Gabapentin , placed on Lyrica  per patient's request.  Dyslipidemia, goal LDL below 70 taking Atorvastatin , LDL 106 08/10/23  Type 2 diabetes mellitus with cardiac complication (HCC) A1c 6.4 08/10/23, ACR 11  Essential hypertension Blood pressure controlled, on  Amlodipine , Carvedilol , Lisinopril . Bun/creat 220.65 08/10/23  Hypothyroidism on Levothyroxine , TSH 4.35 08/10/23   Labs/tests ordered:  none  Next appt:  09/23/2023

## 2023-08-12 NOTE — Assessment & Plan Note (Signed)
 off Gabapentin, placed on Lyrica per patient's request.

## 2023-08-12 NOTE — Assessment & Plan Note (Signed)
s/p CABG x3 11/21/18, (prior stent drug eluting), hx of tachycardia, on oral nitrate, Plavix, Carvedilol, Lisinopril, followed by cardiology.

## 2023-08-12 NOTE — Assessment & Plan Note (Signed)
 taking Atorvastatin, LDL 106 08/10/23

## 2023-08-12 NOTE — Assessment & Plan Note (Signed)
s/p L knee replacement, R hip, L shoulder pain-limited ROM, s/p Ortho, no further surgery, on Tramadol. Vit D 50 01/12/23

## 2023-08-12 NOTE — Assessment & Plan Note (Signed)
hx of

## 2023-08-12 NOTE — Assessment & Plan Note (Signed)
 A1c 6.4 08/10/23, ACR 11

## 2023-08-12 NOTE — Assessment & Plan Note (Signed)
 Stable,  on Famotidine, Pantoprazole, Sucralfate, Hgb 12.8 08/10/23

## 2023-08-12 NOTE — Assessment & Plan Note (Signed)
 Stable, continue Symbicort, Atrovent nasal spray

## 2023-08-12 NOTE — Assessment & Plan Note (Signed)
 Blood pressure controlled, on  Amlodipine, Carvedilol, Lisinopril. Bun/creat 220.65 08/10/23

## 2023-08-12 NOTE — Assessment & Plan Note (Signed)
 on Levothyroxine, TSH 4.35 08/10/23

## 2023-08-12 NOTE — Assessment & Plan Note (Signed)
 s/p kidney stones surgical removal.

## 2023-08-15 ENCOUNTER — Other Ambulatory Visit: Payer: Self-pay | Admitting: Cardiovascular Disease

## 2023-08-16 ENCOUNTER — Encounter: Payer: Self-pay | Admitting: Nurse Practitioner

## 2023-08-17 ENCOUNTER — Other Ambulatory Visit: Payer: Self-pay | Admitting: Nurse Practitioner

## 2023-08-17 DIAGNOSIS — I10 Essential (primary) hypertension: Secondary | ICD-10-CM

## 2023-08-17 NOTE — Telephone Encounter (Signed)
 Patient was only give 30 tablets in December 2024. Medication pend and sent to PCP Mast, Man X, NP for further refills.

## 2023-09-02 ENCOUNTER — Ambulatory Visit (INDEPENDENT_AMBULATORY_CARE_PROVIDER_SITE_OTHER): Payer: Medicare Other | Admitting: Otolaryngology

## 2023-09-02 ENCOUNTER — Encounter (INDEPENDENT_AMBULATORY_CARE_PROVIDER_SITE_OTHER): Payer: Self-pay

## 2023-09-02 VITALS — BP 146/76 | HR 89 | Ht 65.0 in | Wt 192.0 lb

## 2023-09-02 DIAGNOSIS — J310 Chronic rhinitis: Secondary | ICD-10-CM | POA: Diagnosis not present

## 2023-09-02 DIAGNOSIS — J343 Hypertrophy of nasal turbinates: Secondary | ICD-10-CM | POA: Diagnosis not present

## 2023-09-02 DIAGNOSIS — R0981 Nasal congestion: Secondary | ICD-10-CM

## 2023-09-02 DIAGNOSIS — R0982 Postnasal drip: Secondary | ICD-10-CM | POA: Diagnosis not present

## 2023-09-03 ENCOUNTER — Ambulatory Visit (INDEPENDENT_AMBULATORY_CARE_PROVIDER_SITE_OTHER): Payer: Medicare Other | Admitting: Adult Health

## 2023-09-03 ENCOUNTER — Encounter: Payer: Self-pay | Admitting: Adult Health

## 2023-09-03 ENCOUNTER — Telehealth: Payer: Self-pay | Admitting: Cardiovascular Disease

## 2023-09-03 VITALS — BP 132/86 | HR 83 | Temp 97.9°F | Ht 65.0 in | Wt 197.0 lb

## 2023-09-03 DIAGNOSIS — I10 Essential (primary) hypertension: Secondary | ICD-10-CM | POA: Diagnosis not present

## 2023-09-03 DIAGNOSIS — J4541 Moderate persistent asthma with (acute) exacerbation: Secondary | ICD-10-CM

## 2023-09-03 DIAGNOSIS — K219 Gastro-esophageal reflux disease without esophagitis: Secondary | ICD-10-CM | POA: Diagnosis not present

## 2023-09-03 DIAGNOSIS — I2510 Atherosclerotic heart disease of native coronary artery without angina pectoris: Secondary | ICD-10-CM

## 2023-09-03 DIAGNOSIS — E1159 Type 2 diabetes mellitus with other circulatory complications: Secondary | ICD-10-CM

## 2023-09-03 DIAGNOSIS — J310 Chronic rhinitis: Secondary | ICD-10-CM | POA: Insufficient documentation

## 2023-09-03 DIAGNOSIS — Z9861 Coronary angioplasty status: Secondary | ICD-10-CM

## 2023-09-03 DIAGNOSIS — G629 Polyneuropathy, unspecified: Secondary | ICD-10-CM

## 2023-09-03 DIAGNOSIS — R0982 Postnasal drip: Secondary | ICD-10-CM | POA: Insufficient documentation

## 2023-09-03 DIAGNOSIS — E785 Hyperlipidemia, unspecified: Secondary | ICD-10-CM

## 2023-09-03 DIAGNOSIS — J343 Hypertrophy of nasal turbinates: Secondary | ICD-10-CM | POA: Insufficient documentation

## 2023-09-03 MED ORDER — PREDNISONE 20 MG PO TABS: 40.0000 mg | ORAL_TABLET | Freq: Every day | ORAL | 0 refills | Status: DC

## 2023-09-03 MED ORDER — GUAIFENESIN ER 600 MG PO TB12: 600.0000 mg | ORAL_TABLET | Freq: Two times a day (BID) | ORAL | 0 refills | Status: AC

## 2023-09-03 NOTE — Telephone Encounter (Signed)
Called and spoke to patient. Scheduled patient with Bailey Mech, Np at 8:50 am on 2/6. Patient reports she has an appt today with [facility].

## 2023-09-03 NOTE — Progress Notes (Unsigned)
[facility] clinic  Provider:   Code Status: *** Goals of Care:     08/12/2023    3:38 PM  Advanced Directives  Does Patient Have a Medical Advance Directive? Yes  Type of Estate agent of [place];Living will  Does patient want to make changes to medical advance directive? No - Patient declined  Copy of Healthcare Power of Attorney in Chart? Yes - validated most recent copy scanned in chart (See row information)     Chief Complaint  Patient presents with   Acute Visit    Patient complains of SOB for 10 days/ wheezing/fatigue for 10 days. Out of symbicort x 5 days.     HPI: Patient is a 83 y.o. female seen today for an acute visit for  Past Medical History:  Diagnosis Date   Anemia     mild as per PCP   Anginal pain (HCC)    06/20/20, relieved    Arthritis    hip/ s/p recent shoulder fracture 8/12- RIGHT   Asthma    Coronary artery disease 05/30/2018   LAD PCI/DES   GI bleeding    Hiatal hernia    History of kidney stones    Last year had surgery   Humerus fracture    with wrist on right side   Hypertension    hypercholesterolemia/  EKG on chart with clearance and note 06/18/11  Mazzocchi   Pneumonia    Pre-diabetes     Past Surgical History:  Procedure Laterality Date   ABDOMINAL HYSTERECTOMY     COLECTOMY WITH COLOSTOMY CREATION/HARTMANN PROCEDURE N/A 05/07/2016   Procedure: COLOSTOMY CREATION/HARTMANN PROCEDURE;  Surgeon: Romie Levee, MD;  [facility]: [facility];  Service: General;  Laterality: N/A;   COLONOSCOPY N/A 08/26/2016   Procedure: COLONOSCOPY;  Surgeon: Romie Levee, MD;  [facility];  Service: Endoscopy;  Laterality: N/A;   COLOSTOMY TAKEDOWN N/A 08/27/2016   Procedure: LAPAROSCOPIC COLOSTOMY REVERSAL;  Surgeon: Romie Levee, MD;  [facility]: [facility];  Service: General;  Laterality: N/A;   CORONARY ARTERY BYPASS GRAFT N/A 11/21/2018   Procedure: CORONARY ARTERY BYPASS GRAFTING (CABG) x Three , using left internal mammary artery  and right leg greater saphenous vein harvested endoscopically;  Surgeon: Alleen Borne, MD;  [facility]: [facility];  Service: Open Heart Surgery;  Laterality: N/A;   CORONARY STENT INTERVENTION N/A 05/30/2018   Procedure: CORONARY STENT INTERVENTION;  Surgeon: Runell Gess, MD;  [facility]: [facility];  Service: Cardiovascular;  Laterality: N/A;   CYSTOSCOPY WITH RETROGRADE PYELOGRAM, URETEROSCOPY AND STENT PLACEMENT Bilateral 06/21/2020   Procedure: CYSTOSCOPY WITH RETROGRADE PYELOGRAM, AND BILATERAL STENT PLACEMENT;  Surgeon: Sebastian Ache, MD;  [facility];  Service: Urology;  Laterality: Bilateral;   CYSTOSCOPY WITH RETROGRADE PYELOGRAM, URETEROSCOPY AND STENT PLACEMENT Bilateral 07/12/2020   Procedure: CYSTOSCOPY WITH RETROGRADE PYELOGRAM, URETEROSCOPY AND STENT PLACEMENT;  Surgeon: Crist Fat, MD;  [facility]: [facility];  Service: Urology;  Laterality: Bilateral;  2 HRS   EYE SURGERY     eyelid tuck bilateral   HOLMIUM LASER APPLICATION Bilateral 07/12/2020   Procedure: HOLMIUM LASER APPLICATION;  Surgeon: Crist Fat, MD;  [facility];  Service: Urology;  Laterality: Bilateral;   LAPAROTOMY N/A 05/07/2016   Procedure: EXPLORATORY LAPAROTOMY, LYLSIS OF ADHSIONS, Pam Drown OF PERITONEAL ABSCESS;  Surgeon: Romie Levee, MD;  [facility];  Service: General;  Laterality: N/A;   LEFT HEART CATH AND CORONARY ANGIOGRAPHY N/A 05/30/2018   Procedure: LEFT HEART CATH AND CORONARY ANGIOGRAPHY;  Surgeon: Runell Gess, MD;  [facility]: [facility] INVASIVE CV LAB;  Service: Cardiovascular;  Laterality: N/A;   LEFT HEART CATH AND CORONARY ANGIOGRAPHY N/A 11/17/2018   Procedure: LEFT HEART CATH AND CORONARY ANGIOGRAPHY;  Surgeon: Runell Gess, MD;  [facility];  Service: Cardiovascular;  Laterality: N/A;   PARATHYROIDECTOMY     one lobe- benign per pt   TEE WITHOUT CARDIOVERSION N/A 11/21/2018   Procedure: TRANSESOPHAGEAL ECHOCARDIOGRAM (TEE);  Surgeon:  Alleen Borne, MD;  Location: [facility] OR;  Service: Open Heart Surgery;  Laterality: N/A;   TONSILLECTOMY     TOTAL HIP ARTHROPLASTY  07/08/2011   Procedure: TOTAL HIP ARTHROPLASTY;  Surgeon: Gus Rankin Aluisio;  [facility];  Service: Orthopedics;  Laterality: Right;   TOTAL KNEE ARTHROPLASTY Left 02/16/2022   Procedure: TOTAL KNEE ARTHROPLASTY;  Surgeon: Ollen Gross, MD;  [facility];  Service: Orthopedics;  Laterality: Left;    Allergies  Allergen Reactions   Morphine Other (See Comments)    Medication did not work for pain    Clindamycin/Lincomycin    Penicillins Other (See Comments)    UNSPECIFIED REACTION  Unknown reaction as a child.  Tolerated Zosyn 2017. Tolerated Cephalosporin Date: 02/17/22.     Sulfamethoxazole-Trimethoprim    Pregabalin Anxiety    Lyrica     Outpatient Encounter Medications as of 09/03/2023  Medication Sig   albuterol (VENTOLIN HFA) 108 (90 Base) MCG/ACT inhaler Inhale 2 puffs into the lungs every 6 (six) hours as needed for wheezing or shortness of breath.   amLODipine (NORVASC) 5 MG tablet TAKE 1 TABLET BY MOUTH DAILY   atorvastatin (LIPITOR) 80 MG tablet TAKE 1 TABLET(80 MG) BY MOUTH DAILY   budesonide-formoterol (SYMBICORT) 80-4.5 MCG/ACT inhaler Inhale 2 puffs into the lungs in the morning and at bedtime. Also uses as needed for emergency purposes   carvedilol (COREG) 12.5 MG tablet TAKE 1 TABLET BY MOUTH 2 TIMES A DAY   cetirizine (ZYRTEC) 10 MG tablet Take 10 mg by mouth at bedtime.   clopidogrel (PLAVIX) 75 MG tablet TAKE 1 TABLET BY MOUTH DAILY   famotidine (PEPCID AC) 10 MG tablet Take 10 mg by mouth daily as needed for heartburn or indigestion.   ipratropium (ATROVENT) 0.03 % nasal spray Place 2 sprays into both nostrils 3 (three) times daily as needed for rhinitis.   levothyroxine (SYNTHROID) 25 MCG tablet Take 1 tablet (25 mcg total) by mouth daily.   lisinopril (PRINIVIL,ZESTRIL) 40 MG tablet Take 40 mg by mouth daily.   Multiple  Vitamins-Minerals (MULTIVITAMIN WOMEN 50+ PO) Take 1 tablet by mouth daily.   nitroGLYCERIN (NITROSTAT) 0.4 MG SL tablet Place 1 tablet (0.4 mg total) under the tongue as needed.   pantoprazole (PROTONIX) 40 MG tablet Take 1 tablet (40 mg total) by mouth 2 (two) times daily before a meal.   predniSONE (DELTASONE) 20 MG tablet Take 2 tablets (40 mg total) by mouth daily with breakfast.   pregabalin (LYRICA) 25 MG capsule Take 1 capsule (25 mg total) by mouth daily.   Simethicone (PHAZYME ULTIMATE) 500 MG CAPS Take 500 mg by mouth daily as needed (bloating).   Soft Lens Products (BIOTRUE) SOLN Place 1 drop into both eyes as needed (dry eyes).   sucralfate (CARAFATE) 1 g tablet Take your Protonix at least 30 minutes prior to your first morning dose of Carafate.   benzonatate (TESSALON) 200 MG capsule Take 1 capsule (200 mg total) by mouth 2 (two) times daily as needed  for cough. (Patient not taking: Reported on 09/03/2023)   OVER THE COUNTER MEDICATION Apply 1 Application topically as needed (pain). Magna Sport Balm   No facility-administered encounter medications on file as of 09/03/2023.    Review of Systems:  Review of Systems  Constitutional:  Negative for appetite change, chills, fatigue and fever.  HENT:  Negative for congestion, hearing loss, rhinorrhea and sore throat.   Eyes: Negative.   Respiratory:  Positive for cough and wheezing. Negative for shortness of breath.   Cardiovascular:  Positive for leg swelling. Negative for chest pain and palpitations.  Gastrointestinal:  Negative for abdominal pain, constipation, diarrhea, nausea and vomiting.  Genitourinary:  Negative for dysuria.  Musculoskeletal:  Negative for arthralgias, back pain and myalgias.  Skin:  Negative for color change, rash and wound.  Neurological:  Negative for dizziness, weakness and headaches.  Psychiatric/Behavioral:  Negative for behavioral problems. The patient is not nervous/anxious.     Health Maintenance   Topic Date Due   OPHTHALMOLOGY EXAM  Never done   Zoster Vaccines- Shingrix (1 of 2) Never done   DEXA SCAN  Never done   Pneumonia Vaccine 79+ Years old (2 of 2 - PCV) 09/17/2022   Medicare Annual Wellness (AWV)  01/01/2023   COVID-19 Vaccine (10 - 2024-25 season) 06/28/2023   HEMOGLOBIN A1C  02/07/2024   DTaP/Tdap/Td (2 - Td or Tdap) 02/22/2024   FOOT EXAM  06/16/2024   Diabetic kidney evaluation - eGFR measurement  08/09/2024   Diabetic kidney evaluation - Urine ACR  08/09/2024   INFLUENZA VACCINE  Completed   HPV VACCINES  Aged Out    Physical Exam: Vitals:   09/03/23 1456  BP: 132/86  Pulse: 83  Temp: 97.9 F (36.6 C)  TempSrc: Temporal  SpO2: 99%  Weight: 197 lb (89.4 kg)  Height: 5\' 5"  (1.651 m)   Body mass index is 32.78 kg/m. Physical Exam Constitutional:      Appearance: Normal appearance.  HENT:     Head: Normocephalic and atraumatic.     Nose: Nose normal.     Mouth/Throat:     Mouth: Mucous membranes are moist.  Eyes:     Conjunctiva/sclera: Conjunctivae normal.  Cardiovascular:     Rate and Rhythm: Normal rate and regular rhythm.  Pulmonary:     Effort: Pulmonary effort is normal.     Breath sounds: Wheezing present.  Abdominal:     General: Bowel sounds are normal.     Palpations: Abdomen is soft.  Musculoskeletal:        General: Normal range of motion.     Cervical back: Normal range of motion.     Right lower leg: Edema present.     Comments: Trace edema 1+  Skin:    General: Skin is warm and dry.  Neurological:     General: No focal deficit present.     Mental Status: She is alert and oriented to person, place, and time.  Psychiatric:        Mood and Affect: Mood normal.        Behavior: Behavior normal.        Thought Content: Thought content normal.        Judgment: Judgment normal.     Labs reviewed: Basic Metabolic Panel: Recent Labs    01/12/23 0710 01/19/23 0734 04/06/23 0729 06/01/23 0705 08/10/23 0713  NA 141  --    --  141 141  K 4.0  --   --  4.2  3.7  CL 104  --   --  104 104  CO2 28  --   --  24 27  GLUCOSE 121*  --   --  128* 107*  BUN 20  --   --  27* 22  CREATININE 0.65  --   --  0.66 0.65  CALCIUM 9.5  --   --  9.4 9.3  TSH 6.27* 4.64* 5.29*  --  4.35   Liver Function Tests: Recent Labs    01/12/23 0710 08/10/23 0713  AST 16 18  ALT 16 28  BILITOT 0.9 0.7  PROT 6.6 5.7*   No results for input(s): "LIPASE", "AMYLASE" in the last 8760 hours. No results for input(s): "AMMONIA" in the last 8760 hours. CBC: Recent Labs    01/12/23 0710 06/01/23 0705 08/10/23 0713  WBC 6.7 6.7 8.8  NEUTROABS 3,926 4,033 5,350  HGB 13.0 12.9 12.8  HCT 39.2 39.4 39.1  MCV 90.5 91.8 90.9  PLT 169 181 260   Lipid Panel: Recent Labs    01/12/23 0710 08/10/23 0713  CHOL 166 186  HDL 53 53  LDLCALC 83 106*  TRIG 201* 154*  CHOLHDL 3.1 3.5   Lab Results  Component Value Date   HGBA1C 6.4 (H) 08/10/2023    Procedures since last visit: No results found.  Assessment/Plan   Labs/tests ordered:  * No order type specified * Next appt:  09/23/2023

## 2023-09-03 NOTE — Telephone Encounter (Signed)
Called and spoke to patient. Verified name and DOB. Patient stated she has been wheezing and SOB X 10 days. She also report swelling in her right ankle and her abdomen is destined. She deny CP, dizziness, lightheadedness or any other symptoms at this time. Patient was seen by ENT yesterday. They did increase her Albuterol use. Advised patient to go to urgent care. [place] and will have the doctor there evaluate her.

## 2023-09-03 NOTE — Progress Notes (Signed)
Patient ID: Monique [name], female   DOB: September 22, 1940, 83 y.o.   MRN: [id number]  CC: Chronic postnasal drainage  HPI:  Monique [name] is a 83 y.o. female who presents today complaining of chronic postnasal drainage.  She has been symptomatic for several years.  She has also noted frequent nasal congestion.  The patient is allergic to cats.  She also has a history of asthma, with frequent wheezing and coughing.  The patient has a history of tonsillectomy and parathyroidectomy.  She has no other ENT surgery.  Currently she denies any facial pain, fever, or visual change.  Past Medical History:  Diagnosis Date   Anemia     mild as per PCP   Anginal pain (HCC)    06/20/20, relieved    Arthritis    hip/ s/p recent shoulder fracture 8/12- RIGHT   Asthma    Coronary artery disease 05/30/2018   LAD PCI/DES   GI bleeding    Hiatal hernia    History of kidney stones    Last year had surgery   Humerus fracture    with wrist on right side   Hypertension    hypercholesterolemia/  EKG on chart with clearance and note 06/18/11  Mazzocchi   Pneumonia    Pre-diabetes     Past Surgical History:  Procedure Laterality Date   ABDOMINAL HYSTERECTOMY     COLECTOMY WITH COLOSTOMY CREATION/HARTMANN PROCEDURE N/A 05/07/2016   Procedure: COLOSTOMY CREATION/HARTMANN PROCEDURE;  Surgeon: Romie Levee, MD;  [facility];  Service: General;  Laterality: N/A;   COLONOSCOPY N/A 08/26/2016   Procedure: COLONOSCOPY;  Surgeon: Romie Levee, MD;  [facility];  Service: Endoscopy;  Laterality: N/A;   COLOSTOMY TAKEDOWN N/A 08/27/2016   Procedure: LAPAROSCOPIC COLOSTOMY REVERSAL;  Surgeon: Romie Levee, MD;  [facility];  Service: General;  Laterality: N/A;   CORONARY ARTERY BYPASS GRAFT N/A 11/21/2018   Procedure: CORONARY ARTERY BYPASS GRAFTING (CABG) x Three , using left internal mammary artery and right leg greater saphenous vein harvested endoscopically;  Surgeon: Alleen Borne, MD;   [facility];  Service: Open Heart Surgery;  Laterality: N/A;   CORONARY STENT INTERVENTION N/A 05/30/2018   Procedure: CORONARY STENT INTERVENTION;  Surgeon: Runell Gess, MD;  [facility];  Service: Cardiovascular;  Laterality: N/A;   CYSTOSCOPY WITH RETROGRADE PYELOGRAM, URETEROSCOPY AND STENT PLACEMENT Bilateral 06/21/2020   Procedure: CYSTOSCOPY WITH RETROGRADE PYELOGRAM, AND BILATERAL STENT PLACEMENT;  Surgeon: Sebastian Ache, MD;  [facility];  Service: Urology;  Laterality: Bilateral;   CYSTOSCOPY WITH RETROGRADE PYELOGRAM, URETEROSCOPY AND STENT PLACEMENT Bilateral 07/12/2020   Procedure: CYSTOSCOPY WITH RETROGRADE PYELOGRAM, URETEROSCOPY AND STENT PLACEMENT;  Surgeon: Crist Fat, MD;  [facility];  Service: Urology;  Laterality: Bilateral;  2 HRS   EYE SURGERY     eyelid tuck bilateral   HOLMIUM LASER APPLICATION Bilateral 07/12/2020   Procedure: HOLMIUM LASER APPLICATION;  Surgeon: Crist Fat, MD;  [facility];  Service: Urology;  Laterality: Bilateral;   LAPAROTOMY N/A 05/07/2016   Procedure: EXPLORATORY LAPAROTOMY, LYLSIS OF ADHSIONS, Pam Drown OF PERITONEAL ABSCESS;  Surgeon: Romie Levee, MD;  [facility];  Service: General;  Laterality: N/A;   LEFT HEART CATH AND CORONARY ANGIOGRAPHY N/A 05/30/2018   Procedure: LEFT HEART CATH AND CORONARY ANGIOGRAPHY;  Surgeon: Runell Gess, MD;  [facility];  Service: Cardiovascular;  Laterality: N/A;   LEFT HEART CATH AND CORONARY ANGIOGRAPHY N/A 11/17/2018  Procedure: LEFT HEART CATH AND CORONARY ANGIOGRAPHY;  Surgeon: Runell Gess, MD;  [facility];  Service: Cardiovascular;  Laterality: N/A;   PARATHYROIDECTOMY     one lobe- benign per pt   TEE WITHOUT CARDIOVERSION N/A 11/21/2018   Procedure: TRANSESOPHAGEAL ECHOCARDIOGRAM (TEE);  Surgeon: Alleen Borne, MD;  Location: [facility] OR;  Service: Open Heart Surgery;  Laterality: N/A;    TONSILLECTOMY     TOTAL HIP ARTHROPLASTY  07/08/2011   Procedure: TOTAL HIP ARTHROPLASTY;  Surgeon: Gus Rankin Aluisio;  [facility];  Service: Orthopedics;  Laterality: Right;   TOTAL KNEE ARTHROPLASTY Left 02/16/2022   Procedure: TOTAL KNEE ARTHROPLASTY;  Surgeon: Ollen Gross, MD;  [facility];  Service: Orthopedics;  Laterality: Left;    Family History  Problem Relation Age of Onset   CAD Father 51       PCI   Liver disease Neg Hx    Esophageal cancer Neg Hx    Colon cancer Neg Hx     Social History:  reports that she has never smoked. She has never used smokeless tobacco. She reports current alcohol use. She reports that she does not use drugs.  Allergies:  Allergies  Allergen Reactions   Morphine Other (See Comments)    Medication did not work for pain    Clindamycin/Lincomycin    Penicillins Other (See Comments)    UNSPECIFIED REACTION  Unknown reaction as a child.  Tolerated Zosyn 2017. Tolerated Cephalosporin Date: 02/17/22.     Sulfamethoxazole-Trimethoprim    Pregabalin Anxiety    Lyrica     Prior to Admission medications   Medication Sig Start Date End Date Taking? Authorizing Provider  albuterol (VENTOLIN HFA) 108 (90 Base) MCG/ACT inhaler Inhale 2 puffs into the lungs every 6 (six) hours as needed for wheezing or shortness of breath. 08/03/23  Yes Venita Sheffield, MD  amLODipine (NORVASC) 5 MG tablet TAKE 1 TABLET BY MOUTH DAILY 08/17/23  Yes Mast, Man X, NP  atorvastatin (LIPITOR) 80 MG tablet TAKE 1 TABLET(80 MG) BY MOUTH DAILY 01/27/22  Yes Runell Gess, MD  benzonatate (TESSALON) 200 MG capsule Take 1 capsule (200 mg total) by mouth 2 (two) times daily as needed for cough. 08/03/23  Yes Venita Sheffield, MD  budesonide-formoterol (SYMBICORT) 80-4.5 MCG/ACT inhaler Inhale 2 puffs into the lungs in the morning and at bedtime. Also uses as needed for emergency purposes 07/23/21  Yes [provider]  carvedilol (COREG) 12.5 MG  tablet TAKE 1 TABLET BY MOUTH 2 TIMES A DAY 03/25/23  Yes Runell Gess, MD  cetirizine (ZYRTEC) 10 MG tablet Take 10 mg by mouth at bedtime.   Yes [provider]  clopidogrel (PLAVIX) 75 MG tablet TAKE 1 TABLET BY MOUTH DAILY 08/16/23  Yes Runell Gess, MD  famotidine (PEPCID AC) 10 MG tablet Take 10 mg by mouth daily as needed for heartburn or indigestion.   Yes [provider]  ipratropium (ATROVENT) 0.03 % nasal spray Place 2 sprays into both nostrils 3 (three) times daily as needed for rhinitis. 09/17/21  Yes [provider]  levothyroxine (SYNTHROID) 25 MCG tablet Take 1 tablet (25 mcg total) by mouth daily. 04/23/23  Yes Venita Sheffield, MD  lisinopril (PRINIVIL,ZESTRIL) 40 MG tablet Take 40 mg by mouth daily.   Yes [provider]  Multiple Vitamins-Minerals (MULTIVITAMIN WOMEN 50+ PO) Take 1 tablet by mouth daily.   Yes [provider]  nitroGLYCERIN (NITROSTAT) 0.4 MG SL tablet Place  1 tablet (0.4 mg total) under the tongue as needed. 01/08/22  Yes Meng, Wynema Birch, PA  OVER THE COUNTER MEDICATION Apply 1 Application topically as needed (pain). [employer] Sport Balm   Yes [provider]  pantoprazole (PROTONIX) 40 MG tablet Take 1 tablet (40 mg total) by mouth 2 (two) times daily before a meal. 05/18/16  Yes Simaan, Francine Graven, PA-C  predniSONE (DELTASONE) 20 MG tablet Take 2 tablets (40 mg total) by mouth daily with breakfast. 08/03/23  Yes Venita Sheffield, MD  pregabalin (LYRICA) 25 MG capsule Take 1 capsule (25 mg total) by mouth daily. 07/15/23  Yes Mast, Man X, NP  Simethicone (PHAZYME ULTIMATE) 500 MG CAPS Take 500 mg by mouth daily as needed (bloating).   Yes [provider]  Soft Lens Products (BIOTRUE) SOLN Place 1 drop into both eyes as needed (dry eyes).   Yes [provider]  sucralfate (CARAFATE) 1 g tablet Take your Protonix at least 30 minutes prior to your first morning dose of Carafate. 02/12/22   Yes Molpus, John, MD    Blood pressure (!) 146/76, pulse 89, height 5\' 5"  (1.651 m), weight 192 lb (87.1 kg), SpO2 94%. Exam: General: Communicates without difficulty, well nourished, no acute distress. Head: Normocephalic, no evidence injury, no tenderness, facial buttresses intact without stepoff. Face/sinus: No tenderness to palpation and percussion. Facial movement is normal and symmetric. Eyes: PERRL, EOMI. No scleral icterus, conjunctivae clear. Neuro: CN II exam reveals vision grossly intact.  No nystagmus at any point of gaze. Ears: Auricles well formed without lesions.  Ear canals are intact without mass or lesion.  No erythema or edema is appreciated.  The TMs are intact without fluid. Nose: External evaluation reveals normal support and skin without lesions.  Dorsum is intact.  Anterior rhinoscopy reveals congested mucosa over anterior aspect of inferior turbinates and intact septum.  No purulence noted. Oral:  Oral cavity and oropharynx are intact, symmetric, without erythema or edema.  Mucosa is moist without lesions. Neck: Full range of motion without pain.  There is no significant lymphadenopathy.  No masses palpable.  Thyroid bed within normal limits to palpation.  Parotid glands and submandibular glands equal bilaterally without mass.  Trachea is midline. Neuro:  CN 2-12 grossly intact.   Procedure:  Flexible Nasal Endoscopy: Description: Risks, benefits, and alternatives of flexible endoscopy were explained to the patient.  Specific mention was made of the risk of throat numbness with difficulty swallowing, possible bleeding from the nose and mouth, and pain from the procedure.  The patient gave oral consent to proceed.  The flexible scope was inserted into the right nasal cavity.  Endoscopy of the interior nasal cavity, superior, inferior, and middle meatus was performed. The sphenoid-ethmoid recess was examined. Edematous mucosa was noted.  No polyp, mass, or lesion was appreciated.  Olfactory cleft was clear.  Nasopharynx was clear.  Turbinates were hypertrophied but without mass.  The procedure was repeated on the contralateral side with similar findings.  The patient tolerated the procedure well.   Assessment: 1.  Chronic rhinitis with nasal mucosal congestion and bilateral inferior turbinate hypertrophy. 2.  Chronic postnasal drainage. 3.  No acute infection is noted today.  Plan: 1.  The physical exam and nasal endoscopy findings are reviewed with the patient. 2.  Atrovent nasal spray 0.06% 2 sprays each nostril twice daily as needed to treat her chronic nasal drainage. 3.  The patient is reassured that no infection is noted today. 4.  The patient  will return for reevaluation in 2 months.  If she continues to be symptomatic, she may benefit from referral to a rhinologist for cryotherapy.  Monique [name] W Aleene Swanner 09/03/2023, 8:58 AM

## 2023-09-03 NOTE — Telephone Encounter (Signed)
Pt c/o Shortness Of Breath: STAT if SOB developed within the last 24 hours or pt is noticeably SOB on the phone  1. Are you currently SOB (can you hear that pt is SOB on the phone)?   Yes  2. How long have you been experiencing SOB?   Started 10 day ago  3. Are you SOB when sitting or when up moving around? Both sitting and up and moving around  4. Are you currently experiencing any other symptoms?   Wheezing, fatigue  Patient stated she is concerned her SOB and wheezing may be related to her CHF.

## 2023-09-07 NOTE — Progress Notes (Signed)
 Cardiology Office Note:  .   Date:  09/09/2023  ID:  Monique [name], DOB 01-Dec-1940, MRN [id number] PCP: Mast, Man X, NP  [facility] Providers Cardiologist:  Dorn Lesches, MD }   History of Present Illness: .   Monique [name] is a 83 y.o. female with history of CAD, status post  CABG x 3 by Dr. Lucas 11/21/2018 with a LIMA to her LAD, vein to an obtuse marginal branch and ramus branch, hypertension, hyperlipidemia, type 2 diabetes, hypothyroidism.  She comes today after being treated for cough and congestion with steroids and inhalers by allergist.  The patient is currently living at [facility], for the last month.  She states she has gained 10 pounds and has some lower extremity edema and abdominal distention and continues to have coughing and wheezing.  She was seen by allergist with treatment with Mucinex , and inhalers which has helped some along with the prednisone  which she finished up yesterday.  She continues to feel bloated and feeling as if she has water  retention.  Her breathing status has not really improved.  She did not have a chest x-ray.  She denies PND, orthopnea (she has a bed that can raise its had up for better comfort for her) she keeps it elevated, she denies palpitations, near-syncope, or chest pain.  ROS: As above otherwise negative  Studies Reviewed: SABRA   EKG Interpretation Date/Time:  Thursday September 09 2023 09:18:29 EST Ventricular Rate:  74 PR Interval:  166 QRS Duration:  90 QT Interval:  380 QTC Calculation: 421 R Axis:   -5  Text Interpretation: Sinus rhythm with Premature atrial complexes with Abberant conduction ST & T wave abnormality, consider anterior ischemia When compared with ECG of 23-Mar-2023 12:05, Abberant conduction is now Present Nonspecific T wave abnormality now evident in Inferior leads Confirmed by Jerilynn Collar [phone]) on 09/09/2023 10:01:49 AM      Physical Exam:   VS:  BP 122/60  (BP Location: Right Arm, Patient Position: Sitting, Cuff Size: Large)   Pulse 75   Ht 5' 5 (1.651 m)   Wt 196 lb (88.9 kg)   SpO2 95%   BMI 32.62 kg/m    Wt Readings from Last 3 Encounters:  09/09/23 196 lb (88.9 kg)  09/03/23 197 lb (89.4 kg)  09/02/23 192 lb (87.1 kg)    GEN: Well nourished, well developed in no acute distress NECK: No JVD; No carotid bruits CARDIAC: RRR, no murmurs, rubs, gallops RESPIRATORY: Expiratory wheezes in the upper lobes bilaterally, heard anteriorly and posteriorly, coughing with inspiration. ABDOMEN: Soft, non-tender, mildly distended, no ballottement EXTREMITIES: Mild dependent bilateral edema; No deformity   ASSESSMENT AND PLAN: .    Possible diastolic CHF: I am going to order a chest x-ray to evaluate her status.  She will have an echocardiogram for surveillance in comparison to prior echocardiogram in 2020 with normal LV systolic function.  I will give her 2 days of Lasix  20 mg daily with potassium 20 mill equivalents daily and then use as needed weight gain of 2 to 3 pounds in 3 to 5 days.  She is to continue Mucinex  and inhalers as directed.  She will have a repeat BMET just prior to being seen in the office on the 17th by Scot Ford, PA.   2.  Coronary artery disease: History of CABG x 3 by Dr. Lucas in 2020 with LIMA to LAD and obtuse marginal branch and ramus branch.  She offers  no complaints of chest pain.  She remains on secondary prevention with blood pressure control, cholesterol management, low-cholesterol diet.  I have suggested purposeful exercise and she states she does a lot of walking where she lives but her breathing status has not allowed her to be as active.  3.  Hypercholesterolemia: Remains on atorvastatin  80 mg daily.  Goal of LDL less than 70.  Most recent labs total cholesterol 186, HDL 53, triglycerides 154, LDL 106,  dated 08/10/2023.  May need to consider adding Zetia on next office appointment or referral to lipid clinic for PCSK9  inhibition   4.  Hypertension: Blood pressure is well-controlled today.  Careful surveillance of this with addition of the Lasix  temporarily and as needed.  She is on lisinopril  40 mg daily as well as carvedilol  12.5 mg twice daily.  She will call us  if she has any dizziness or near syncope.        Signed, Lamarr HERO. Jerilynn CHOL, ANP, AACC

## 2023-09-09 ENCOUNTER — Encounter: Payer: Self-pay | Admitting: Adult Health

## 2023-09-09 ENCOUNTER — Ambulatory Visit
Admission: RE | Admit: 2023-09-09 | Discharge: 2023-09-09 | Disposition: A | Discharge status: Home / Self Care | Payer: Medicare Other | Source: Ambulatory Visit | Attending: Adult Health | Admitting: Adult Health

## 2023-09-09 ENCOUNTER — Ambulatory Visit: Payer: Medicare Other | Attending: Adult Health | Admitting: Adult Health

## 2023-09-09 VITALS — BP 122/60 | HR 75 | Ht 65.0 in | Wt 196.0 lb

## 2023-09-09 DIAGNOSIS — I519 Heart disease, unspecified: Secondary | ICD-10-CM | POA: Diagnosis present

## 2023-09-09 DIAGNOSIS — Z79899 Other long term (current) drug therapy: Secondary | ICD-10-CM | POA: Insufficient documentation

## 2023-09-09 DIAGNOSIS — I2510 Atherosclerotic heart disease of native coronary artery without angina pectoris: Secondary | ICD-10-CM | POA: Diagnosis present

## 2023-09-09 DIAGNOSIS — E7800 Pure hypercholesterolemia, unspecified: Secondary | ICD-10-CM | POA: Insufficient documentation

## 2023-09-09 DIAGNOSIS — R0602 Shortness of breath: Secondary | ICD-10-CM | POA: Diagnosis present

## 2023-09-09 DIAGNOSIS — Z951 Presence of aortocoronary bypass graft: Secondary | ICD-10-CM | POA: Diagnosis present

## 2023-09-09 DIAGNOSIS — R062 Wheezing: Secondary | ICD-10-CM | POA: Insufficient documentation

## 2023-09-09 DIAGNOSIS — I10 Essential (primary) hypertension: Secondary | ICD-10-CM | POA: Insufficient documentation

## 2023-09-09 MED ORDER — POTASSIUM CHLORIDE CRYS ER 20 MEQ PO TBCR
20.0000 meq | EXTENDED_RELEASE_TABLET | Freq: Every day | ORAL | 3 refills | Status: DC
Start: 2023-09-09 — End: 2023-09-20

## 2023-09-09 MED ORDER — FUROSEMIDE 20 MG PO TABS: 20.0000 mg | ORAL_TABLET | Freq: Every day | ORAL | 3 refills | Status: DC

## 2023-09-09 NOTE — Patient Instructions (Addendum)
 Medication Instructions:  START LASIX  20 MG DAILY, TAKE TWICE DAILY FOR 2 DAYS THEN AS NEEDED FOR WEIGHT GAIN  START POTASSIUM 20 MEQ DAILY, TAKE WITH LASIX   *If you need a refill on your cardiac medications before your next appointment, please call your pharmacy*   Lab Work: BMET IN 1 WEEK If you have labs (blood work) drawn today and your tests are completely normal, you will receive your results only by: MyChart Message (if you have MyChart) OR A paper copy in the mail If you have any lab test that is abnormal or we need to change your treatment, we will call you to review the results.   Testing/Procedures:[address] Your physician has requested that you have an echocardiogram. Echocardiography is a painless test that uses sound waves to create images of your heart. It provides your doctor with information about the size and shape of your heart and how well your heart's chambers and valves are working. This procedure takes approximately one hour. There are no restrictions for this procedure. Please do NOT wear cologne, perfume, aftershave, or lotions (deodorant is allowed). Please arrive 15 minutes prior to your appointment time.  Please note: We ask at that you not bring children with you during ultrasound (echo/ vascular) testing. Due to room size and safety concerns, children are not allowed in the ultrasound rooms during exams. Our front office staff cannot provide observation of children in our lobby area while testing is being conducted. An adult accompanying a patient to their appointment will only be allowed in the ultrasound room at the discretion of the ultrasound technician under special circumstances. We apologize for any inconvenience.   The chest X ray has been ordered ---go to [facility] --[address]---I will call you when I get the report (usually about an hour after you complete the X ray. There is no appointment needed just show up anytime  after 8 am --the sooner you go the sooner I will have an answer    Follow-Up: At [facility], you and your health needs are our priority.  As part of our continuing mission to provide you with exceptional heart care, we have created designated Provider Care Teams.  These Care Teams include your primary Cardiologist (physician) and Advanced Practice Providers (APPs -  Physician Assistants and Nurse Practitioners) who all work together to provide you with the care you need, when you need it.   Your next appointment:   KEEP FOLLOW UP September 20 2023   Provider:   Scot Ford, PA    Other Instructions Weigh daily at same time each day and keep record, bring to follow up appointment.

## 2023-09-10 ENCOUNTER — Non-Acute Institutional Stay: Payer: Medicare Other | Admitting: Sports Medicine

## 2023-09-10 ENCOUNTER — Ambulatory Visit (HOSPITAL_COMMUNITY)
Admission: RE | Admit: 2023-09-10 | Discharge: 2023-09-10 | Disposition: A | Discharge status: Home / Self Care | Payer: Medicare Other | Source: Ambulatory Visit | Attending: Adult Health | Admitting: Adult Health

## 2023-09-10 ENCOUNTER — Encounter: Payer: Self-pay | Admitting: Sports Medicine

## 2023-09-10 VITALS — BP 100/68 | HR 80 | Temp 97.1°F | Resp 17 | Ht 65.0 in | Wt 193.8 lb

## 2023-09-10 DIAGNOSIS — I10 Essential (primary) hypertension: Secondary | ICD-10-CM | POA: Diagnosis present

## 2023-09-10 DIAGNOSIS — R0602 Shortness of breath: Secondary | ICD-10-CM | POA: Diagnosis not present

## 2023-09-10 DIAGNOSIS — J4541 Moderate persistent asthma with (acute) exacerbation: Secondary | ICD-10-CM | POA: Diagnosis not present

## 2023-09-10 DIAGNOSIS — I519 Heart disease, unspecified: Secondary | ICD-10-CM

## 2023-09-10 LAB — ECHOCARDIOGRAM COMPLETE
AR max vel: 2.38 cm2
AV Area VTI: 2.51 cm2
AV Area mean vel: 2.3 cm2
AV Mean grad: 3 mm[Hg]
AV Peak grad: 5.9 mm[Hg]
Ao pk vel: 1.21 m/s
Area-P 1/2: 2.82 cm2
MV VTI: 2.57 cm2
S' Lateral: 3.5 cm
Single Plane A2C EF: 54.7 %

## 2023-09-10 NOTE — Progress Notes (Signed)
Careteam: Patient Care Team: Mast, Man X, NP as PCP - General (Internal Medicine) Allyson Sabal Delton See, MD as PCP - Cardiology (Cardiology)  PLACE OF SERVICE:  [facility]  Advanced Directive information Does Patient Have a Medical Advance Directive?: Yes, Type of Advance Directive: Healthcare Power of [place];Living will, Does patient want to make changes to medical advance directive?: No - Patient declined  Allergies  Allergen Reactions   Morphine Other (See Comments)    Medication did not work for pain    Penicillin G Other (See Comments)    penicillin G   Clindamycin/Lincomycin    Penicillins Other (See Comments)    UNSPECIFIED REACTION  Unknown reaction as a child.  Tolerated Zosyn 2017. Tolerated Cephalosporin Date: 02/17/22.     Sulfamethoxazole-Trimethoprim     Chief Complaint  Patient presents with   Follow-up    Discuss diabetic medication     Discussed the use of AI scribe software for clinical note transcription with the patient, who gave verbal consent to proceed.  History of Present Illness   Monique [name] is an 83 year old female with asthma who presents for follow up   She has been experiencing shortness of breath and wheezing for the past two weeks. She has difficulty breathing at night, requiring her to sit up to sleep. Pt reports that her asthma was stable until recently.  She has been using a Symbicort inhaler every six hours, which is more frequent than her previous regimen. She completed a course of prednisone two days ago . She also uses a rescue inhaler as needed, although she finds it less effective than Symbicort. She feels better today.  She visited a cardiologist due to concerns about congestive heart failure, given her history of a bypass surgery in 2020. She underwent a chest x-ray and echocardiogram recently, although she has not yet received the results. She was prescribed Lasix, which she started taking yesterday.  She reports some  swelling in her right leg, which has decreased with the use of Lasix. No chest pain, palpitations, dizziness, or lightheadedness. She reports feeling slowed down and experiencing leg heaviness after exertion. She has not experienced any pain with urination, stomach pain, or vomiting.         Review of Systems:  Review of Systems  Constitutional:  Negative for fever.  HENT:  Negative for congestion and sore throat.   Eyes:  Negative for double vision.  Respiratory:  Positive for shortness of breath and wheezing. Negative for cough and sputum production.   Cardiovascular:  Negative for chest pain, palpitations and leg swelling.  Gastrointestinal:  Negative for abdominal pain, heartburn and nausea.  Genitourinary:  Negative for dysuria, frequency and hematuria.  Musculoskeletal:  Negative for falls and myalgias.  Neurological:  Negative for dizziness, sensory change and focal weakness.   Negative unless indicated in HPI.   Past Medical History:  Diagnosis Date   Anemia     mild as per PCP   Anginal pain (HCC)    06/20/20, relieved    Arthritis    hip/ s/p recent shoulder fracture 8/12- RIGHT   Asthma    Coronary artery disease 05/30/2018   LAD PCI/DES   GI bleeding    Hiatal hernia    History of kidney stones    Last year had surgery   Humerus fracture    with wrist on right side   Hypertension    hypercholesterolemia/  EKG on chart with clearance and note 06/18/11  Mazzocchi   Pneumonia    Pre-diabetes    Past Surgical History:  Procedure Laterality Date   ABDOMINAL HYSTERECTOMY     COLECTOMY WITH COLOSTOMY CREATION/HARTMANN PROCEDURE N/A 05/07/2016   Procedure: COLOSTOMY CREATION/HARTMANN PROCEDURE;  Surgeon: Romie Levee, MD;  Location: [facility];  Service: General;  Laterality: N/A;   COLONOSCOPY N/A 08/26/2016   Procedure: COLONOSCOPY;  Surgeon: Romie Levee, MD;  Location: [facility];  Service: Endoscopy;  Laterality: N/A;   COLOSTOMY TAKEDOWN N/A 08/27/2016    Procedure: LAPAROSCOPIC COLOSTOMY REVERSAL;  Surgeon: Romie Levee, MD;  Location: [facility];  Service: General;  Laterality: N/A;   CORONARY ARTERY BYPASS GRAFT N/A 11/21/2018   Procedure: CORONARY ARTERY BYPASS GRAFTING (CABG) x Three , using left internal mammary artery and right leg greater saphenous vein harvested endoscopically;  Surgeon: Alleen Borne, MD;  Location: [facility];  Service: Open Heart Surgery;  Laterality: N/A;   CORONARY STENT INTERVENTION N/A 05/30/2018   Procedure: CORONARY STENT INTERVENTION;  Surgeon: Runell Gess, MD;  Location: [facility];  Service: Cardiovascular;  Laterality: N/A;   CYSTOSCOPY WITH RETROGRADE PYELOGRAM, URETEROSCOPY AND STENT PLACEMENT Bilateral 06/21/2020   Procedure: CYSTOSCOPY WITH RETROGRADE PYELOGRAM, AND BILATERAL STENT PLACEMENT;  Surgeon: Sebastian Ache, MD;  Location: [facility];  Service: Urology;  Laterality: Bilateral;   CYSTOSCOPY WITH RETROGRADE PYELOGRAM, URETEROSCOPY AND STENT PLACEMENT Bilateral 07/12/2020   Procedure: CYSTOSCOPY WITH RETROGRADE PYELOGRAM, URETEROSCOPY AND STENT PLACEMENT;  Surgeon: Crist Fat, MD;  Location: [facility];  Service: Urology;  Laterality: Bilateral;  2 HRS   EYE SURGERY     eyelid tuck bilateral   HOLMIUM LASER APPLICATION Bilateral 07/12/2020   Procedure: HOLMIUM LASER APPLICATION;  Surgeon: Crist Fat, MD;  Location: [facility];  Service: Urology;  Laterality: Bilateral;   LAPAROTOMY N/A 05/07/2016   Procedure: EXPLORATORY LAPAROTOMY, LYLSIS OF ADHSIONS, Pam Drown OF PERITONEAL ABSCESS;  Surgeon: Romie Levee, MD;  Location: [facility];  Service: General;  Laterality: N/A;   LEFT HEART CATH AND CORONARY ANGIOGRAPHY N/A 05/30/2018   Procedure: LEFT HEART CATH AND CORONARY ANGIOGRAPHY;  Surgeon: Runell Gess, MD;  Location: [facility];  Service: Cardiovascular;  Laterality: N/A;   LEFT HEART CATH AND CORONARY ANGIOGRAPHY N/A 11/17/2018   Procedure: LEFT HEART CATH AND CORONARY  ANGIOGRAPHY;  Surgeon: Runell Gess, MD;  Location: [facility];  Service: Cardiovascular;  Laterality: N/A;   PARATHYROIDECTOMY     one lobe- benign per pt   TEE WITHOUT CARDIOVERSION N/A 11/21/2018   Procedure: TRANSESOPHAGEAL ECHOCARDIOGRAM (TEE);  Surgeon: Alleen Borne, MD;  Location: [facility] OR;  Service: Open Heart Surgery;  Laterality: N/A;   TONSILLECTOMY     TOTAL HIP ARTHROPLASTY  07/08/2011   Procedure: TOTAL HIP ARTHROPLASTY;  Surgeon: Gus Rankin Aluisio;  Location: [facility];  Service: Orthopedics;  Laterality: Right;   TOTAL KNEE ARTHROPLASTY Left 02/16/2022   Procedure: TOTAL KNEE ARTHROPLASTY;  Surgeon: Ollen Gross, MD;  Location: [facility];  Service: Orthopedics;  Laterality: Left;   Social History:   reports that she has never smoked. She has never used smokeless tobacco. She reports current alcohol use. She reports that she does not use drugs.  Family History  Problem Relation Age of Onset   CAD Father 63       PCI   Liver disease Neg Hx    Esophageal cancer Neg Hx    Colon cancer Neg Hx     Medications: Patient's Medications  New Prescriptions  No medications on file  Previous Medications   ALBUTEROL (VENTOLIN HFA) 108 (90 BASE) MCG/ACT INHALER    Inhale 2 puffs into the lungs every 6 (six) hours as needed for wheezing or shortness of breath.   AMLODIPINE (NORVASC) 5 MG TABLET    TAKE 1 TABLET BY MOUTH DAILY   ATORVASTATIN (LIPITOR) 80 MG TABLET    TAKE 1 TABLET(80 MG) BY MOUTH DAILY   BUDESONIDE-FORMOTEROL (SYMBICORT) 80-4.5 MCG/ACT INHALER    Inhale 2 puffs into the lungs in the morning and at bedtime. Also uses as needed for emergency purposes   CARVEDILOL (COREG) 12.5 MG TABLET    TAKE 1 TABLET BY MOUTH 2 TIMES A DAY   CETIRIZINE (ZYRTEC) 10 MG TABLET    Take 10 mg by mouth at bedtime.   CLOPIDOGREL (PLAVIX) 75 MG TABLET    TAKE 1 TABLET BY MOUTH DAILY   FAMOTIDINE (PEPCID AC) 10 MG TABLET    Take 10 mg by mouth daily as needed for heartburn or  indigestion.   FUROSEMIDE (LASIX) 20 MG TABLET    Take 1 tablet (20 mg total) by mouth daily.   IPRATROPIUM (ATROVENT) 0.03 % NASAL SPRAY    Place 2 sprays into both nostrils 3 (three) times daily as needed for rhinitis.   LEVOTHYROXINE (SYNTHROID) 25 MCG TABLET    Take 1 tablet (25 mcg total) by mouth daily.   LISINOPRIL (PRINIVIL,ZESTRIL) 40 MG TABLET    Take 40 mg by mouth daily.   MULTIPLE VITAMINS-MINERALS (MULTIVITAMIN WOMEN 50+ PO)    Take 1 tablet by mouth daily.   NITROGLYCERIN (NITROSTAT) 0.4 MG SL TABLET    Place 1 tablet (0.4 mg total) under the tongue as needed.   OVER THE COUNTER MEDICATION    Apply 1 Application topically as needed (pain). Magna Sport Balm   PANTOPRAZOLE (PROTONIX) 40 MG TABLET    Take 1 tablet (40 mg total) by mouth 2 (two) times daily before a meal.   POTASSIUM CHLORIDE SA (KLOR-CON M) 20 MEQ TABLET    Take 1 tablet (20 mEq total) by mouth daily.   PREDNISONE (DELTASONE) 20 MG TABLET    Take 2 tablets (40 mg total) by mouth daily with breakfast.   PREGABALIN (LYRICA) 25 MG CAPSULE    Take 1 capsule (25 mg total) by mouth daily.   SIMETHICONE (PHAZYME ULTIMATE) 500 MG CAPS    Take 500 mg by mouth daily as needed (bloating).   SOFT LENS PRODUCTS (BIOTRUE) SOLN    Place 1 drop into both eyes as needed (dry eyes).   SUCRALFATE (CARAFATE) 1 G TABLET    Take your Protonix at least 30 minutes prior to your first morning dose of Carafate.  Modified Medications   No medications on file  Discontinued Medications   BENZONATATE (TESSALON) 200 MG CAPSULE    Take 1 capsule (200 mg total) by mouth 2 (two) times daily as needed for cough.    Physical Exam: Vitals:   09/10/23 1056  BP: 100/68  Pulse: 80  Resp: 17  Temp: (!) 97.1 F (36.2 C)  SpO2: 98%  Weight: 193 lb 12.8 oz (87.9 kg)  Height: 5\' 5"  (1.651 m)   Body mass index is 32.25 kg/m. BP Readings from Last 3 Encounters:  09/10/23 100/68  09/09/23 122/60  09/03/23 132/86   Wt Readings from Last 3  Encounters:  09/10/23 193 lb 12.8 oz (87.9 kg)  09/09/23 196 lb (88.9 kg)  09/03/23 197 lb (89.4 kg)  Physical Exam Constitutional:      Appearance: Normal appearance.  HENT:     Head: Normocephalic and atraumatic.  Cardiovascular:     Rate and Rhythm: Normal rate and regular rhythm.  Pulmonary:     Effort: Pulmonary effort is normal. No respiratory distress.     Breath sounds: Normal breath sounds. No wheezing.  Abdominal:     General: Bowel sounds are normal. There is no distension.     Tenderness: There is no abdominal tenderness. There is no guarding or rebound.     Comments:    Musculoskeletal:        General: No swelling or tenderness.  Neurological:     Mental Status: She is alert. Mental status is at baseline.     Sensory: No sensory deficit.     Motor: No weakness.     Labs reviewed: Basic Metabolic Panel: Recent Labs    01/12/23 0710 01/19/23 0734 04/06/23 0729 06/01/23 0705 08/10/23 0713  NA 141  --   --  141 141  K 4.0  --   --  4.2 3.7  CL 104  --   --  104 104  CO2 28  --   --  24 27  GLUCOSE 121*  --   --  128* 107*  BUN 20  --   --  27* 22  CREATININE 0.65  --   --  0.66 0.65  CALCIUM 9.5  --   --  9.4 9.3  TSH 6.27* 4.64* 5.29*  --  4.35   Liver Function Tests: Recent Labs    01/12/23 0710 08/10/23 0713  AST 16 18  ALT 16 28  BILITOT 0.9 0.7  PROT 6.6 5.7*   No results for input(s): "LIPASE", "AMYLASE" in the last 8760 hours. No results for input(s): "AMMONIA" in the last 8760 hours. CBC: Recent Labs    01/12/23 0710 06/01/23 0705 08/10/23 0713  WBC 6.7 6.7 8.8  NEUTROABS 3,926 4,033 5,350  HGB 13.0 12.9 12.8  HCT 39.2 39.4 39.1  MCV 90.5 91.8 90.9  PLT 169 181 260   Lipid Panel: Recent Labs    01/12/23 0710 08/10/23 0713  CHOL 166 186  HDL 53 53  LDLCALC 83 106*  TRIG 201* 154*  CHOLHDL 3.1 3.5   TSH: Recent Labs    01/19/23 0734 04/06/23 0729 08/10/23 0713  TSH 4.64* 5.29* 4.35   A1C: Lab Results   Component Value Date   HGBA1C 6.4 (H) 08/10/2023    Assessment and Plan    Asthma Exacerbation with shortness of breath, wheezing, and nocturnal symptoms for the past 2 weeks.  Symptoms improved with prednisone and Symbicort.  Lungs clear on examination today. -Continue Symbicort -Use Albuterol or nebulizer as needed for wheezing.  Edema Right leg swelling improved with Lasix. -Continue Lasix 40mg  for 2 days, then as needed.  Prediabetes A1C previously 6.8,  Avoid high carbohydrate foods Exercise regularly -Monitor blood glucose levels.            Yanelli Zapanta

## 2023-09-13 ENCOUNTER — Telehealth: Payer: Self-pay

## 2023-09-13 NOTE — Telephone Encounter (Addendum)
 Results view by patient via Mychart.----- Message from Friddie Jetty sent at 09/10/2023  5:28 PM EST ----- I have reviewed echocardiogram report.  Heart pumping function is normal with very slight enlargement of the left ventricle due to hypertension.  He does have some stiffening on relaxation that is of a very minimal amount.  Best to keep blood pressure under control to keep heart from becoming more stiff with aging.  He does have a leaky mitral valve which is not concerning.  Continue current medication regimen.Overall good report.

## 2023-09-14 ENCOUNTER — Telehealth: Payer: Self-pay

## 2023-09-14 NOTE — Telephone Encounter (Addendum)
Results viewed by patient via Mychart.----- Message from Joni Reining sent at 09/13/2023  7:48 AM EST ----- There is no evidence of heart failure (fluid in the lungs), pneumonia.  Good report. How is your breathing?

## 2023-09-15 ENCOUNTER — Other Ambulatory Visit: Payer: Self-pay | Admitting: Cardiovascular Disease

## 2023-09-15 ENCOUNTER — Other Ambulatory Visit: Payer: Self-pay | Admitting: Nurse Practitioner

## 2023-09-15 DIAGNOSIS — I10 Essential (primary) hypertension: Secondary | ICD-10-CM

## 2023-09-17 ENCOUNTER — Other Ambulatory Visit: Payer: Self-pay

## 2023-09-17 DIAGNOSIS — I10 Essential (primary) hypertension: Secondary | ICD-10-CM

## 2023-09-17 LAB — BASIC METABOLIC PANEL
BUN/Creatinine Ratio: 28 (ref 12–28)
BUN: 34 mg/dL — ABNORMAL HIGH (ref 8–27)
CO2: 20 mmol/L (ref 20–29)
Calcium: 9.9 mg/dL (ref 8.7–10.3)
Chloride: 102 mmol/L (ref 96–106)
Creatinine, Ser: 1.2 mg/dL — ABNORMAL HIGH (ref 0.57–1.00)
Glucose: 141 mg/dL — ABNORMAL HIGH (ref 70–99)
Potassium: 5.3 mmol/L — ABNORMAL HIGH (ref 3.5–5.2)
Sodium: 140 mmol/L (ref 134–144)
eGFR: 45 mL/min/{1.73_m2} — ABNORMAL LOW (ref >59)

## 2023-09-17 MED ORDER — AMLODIPINE BESYLATE 5 MG PO TABS: 5.0000 mg | ORAL_TABLET | Freq: Every day | ORAL | 0 refills | Status: DC

## 2023-09-20 ENCOUNTER — Ambulatory Visit: Payer: Medicare Other | Attending: Physician Assistant | Admitting: Physician Assistant

## 2023-09-20 ENCOUNTER — Encounter: Payer: Self-pay | Admitting: Physician Assistant

## 2023-09-20 VITALS — BP 124/72 | HR 78 | Ht 65.0 in | Wt 194.4 lb

## 2023-09-20 DIAGNOSIS — E785 Hyperlipidemia, unspecified: Secondary | ICD-10-CM

## 2023-09-20 DIAGNOSIS — Z713 Dietary counseling and surveillance: Secondary | ICD-10-CM | POA: Diagnosis present

## 2023-09-20 DIAGNOSIS — R0600 Dyspnea, unspecified: Secondary | ICD-10-CM | POA: Diagnosis not present

## 2023-09-20 DIAGNOSIS — I10 Essential (primary) hypertension: Secondary | ICD-10-CM | POA: Diagnosis not present

## 2023-09-20 DIAGNOSIS — I25810 Atherosclerosis of coronary artery bypass graft(s) without angina pectoris: Secondary | ICD-10-CM

## 2023-09-20 DIAGNOSIS — E119 Type 2 diabetes mellitus without complications: Secondary | ICD-10-CM | POA: Diagnosis present

## 2023-09-20 MED ORDER — FUROSEMIDE 20 MG PO TABS: 20.0000 mg | ORAL_TABLET | ORAL | Status: DC | PRN

## 2023-09-20 MED ORDER — POTASSIUM CHLORIDE CRYS ER 20 MEQ PO TBCR: 20.0000 meq | EXTENDED_RELEASE_TABLET | ORAL | Status: DC | PRN

## 2023-09-20 NOTE — Progress Notes (Unsigned)
Cardiology Office Note:  .   Date:  09/22/2023  ID:  Monique [name], DOB 05/29/1941, MRN [id number] PCP: Mast, Man X, NP  [facility] Providers Cardiologist:  Monique Batty, MD     History of Present Illness: .   Monique [name] is a 83 y.o. female with a hx of HTN, HLD, CAD s/p CABG and history of asymptomatic sinus tachycardia.  She has strong family history of CAD with her father died of MI at age 90.  She underwent diagnostic cardiac catheterization on 05/30/2018 which revealed high-grade calcified proximal LAD lesion treated with 2.5 [name] 20 mm Synergy DES, 80% small ramus lesion and 40% ostial OM1 were treated medically.  She was also anemic with hemoglobin of 8.1 at the time.  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  She eventually underwent CABG x3 by Dr. Laneta [name] on 11/21/2018 with LIMA-LAD, SVG-OM1, and SVG-ramus.  Postop [name] was uncomplicated.   Patient was recently seen by my colleague Monique Reining, NP on 09/09/2023 at which time she was being treated for cough and congestion with steroid and the inhalers by allergist.  She lives at a [facility] assisted [facility].  She was felt to be volume overloaded and was given 20 mg daily of Lasix for 2 days along with potassium supplement.  Repeat echocardiogram was performed on 09/10/2023 which showed EF 50 to 55%, no regional wall motion abnormality, grade 1 DD, RVSP 29.0 mmHg, mild MR.  Patient presents today for follow-up.  Her shortness of breath has significantly improved after starting on the diuretic.  However based on blood work, her renal function worsened and both Lasix and the potassium has changed to as needed.  She says she is no longer short of breath with exertion, her breathing is back to her baseline.  Given the fact that her symptom has resolved, I will hold off on the stress  test.  I recommend a follow-up in 3 months for reassessment.  She has no lower extremity edema on exam, her lung is clear.  Heart rate is regular.  She is interested in weight loss medication.  She was previously diagnosed with diabetes with hemoglobin A1c of 6.11 April 2023, most recent hemoglobin A1c is 6.4.  I discussed her case with our clinical pharmacist who felt she may be a candidate for Ozempic and Mounjaro.  I will make a referral to our clinical pharmacist.  ROS:   She denies chest pain, palpitations, dyspnea, pnd, orthopnea, n, v, dizziness, syncope, edema, weight gain, or early satiety. All other systems reviewed and are otherwise negative except as noted above.    Studies Reviewed: Marland Kitchen   EKG Interpretation Date/Time:  Monday September 20 2023 13:55:17 EST Ventricular Rate:  72 PR Interval:  174 QRS Duration:  86 QT Interval:  360 QTC Calculation: 394 R Axis:   30  Text Interpretation: Normal sinus rhythm  Nonspecific ST changes Confirmed by Monique [name] [phone]) on 09/22/2023 2:30:31 PM    Cardiac Studies & Procedures   ______________________________________________________________________________________________ CARDIAC CATHETERIZATION  CARDIAC CATHETERIZATION 11/17/2018  Narrative Images from the original result were not included.   Ost LAD to Prox LAD lesion is 95% stenosed.  Lat Ramus lesion is 90% stenosed.  Ramus lesion is 90% stenosed.  Monique [name] is a 83 y.o. female   [id number] LOCATION:  FACILITY: [facility] PHYSICIAN: Monique [name], M.D. 02/15/1941   DATE OF PROCEDURE:  11/17/2018  DATE OF DISCHARGE:     CARDIAC CATHETERIZATION    History obtained from chart review.SHALESE [name] is a 83y.o.  moderately overweight widowed Caucasian female mother of 2, grandmother and 2 grandchildren's husband Monique [name] was a long-term patient of mine who died 08-21-2016. 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 She was referred by Monique [name]  nurse practitioner for a symptomatic sinus tachycardia.  I last saw her in the office  07/13/2018.Marland Kitchen Her cardiac risk factors include treated hypertension and hyperlipidemia patient is a family history of heart disease with father who died of a myocardial function and age 33. She has never had a heart attack or stroke. She denies chest pain or shortness of breath. She does exercise and does yoga as well. She has reactive airways disease. She is noted to be tachycardic by her PCP was referred here for further evaluation.  When I saw her in the office 05/27/2018 she was complaining of increasing dyspnea and exertional chest pain.  I performed radial diagnostic cath 05/22/2018 revealing high-grade calcified proximal LAD stenosis.  I stented her with a 2.5 mm [name] 20 mm long Synergy drug-eluting stent however there was a small area that would not completely expand which left her with a fairly focal 50% stenosis within the dilated segment.  She also had an 80% small ramus branch stenosis which was untreated.  Her angina has resolved her dyspnea has improved.  She was significantly anemic with a hemoglobin of 8.1 during her hospitalization which is improved with iron repletion up to the low 9 range.  She had done well until earlier this month when she started developing crescendo angina.  She was seen by Monique [name] on 11/04/2018 who adjusted her medications and increased her long-acting oral nitrate.  She continues to have effort angina which is fairly reproducible as well as some unstable symptoms at night.  I suspect her proximal LAD stent has restenosed and that her best option would be CABG.  She is on aspirin and Plavix.  I am arranging for her to undergo diagnostic coronary angiography by myself this coming Thursday.  Impression Monique [name] has aggressive proximal LAD in-stent restenosis not unexpected given the failure to expand completely during her initial procedure 7 months ago.  In addition, she has  high-grade disease in a ramus branch and a high first marginal branch.  Her dominant right coronary artery is free of disease and she had normal LV function at the time of her last procedure.  She has had accelerated angina.  At this point, I believe the best option is CABG.  She has been on aspirin Plavix.  She will need Plavix washout in the hospital since she has had accelerated symptoms.  The sheath was removed and a TR band was placed on the right wrist to achieve patent hemostasis.  The patient left lab in stable condition.  I have notified her daughter of Ms. Hritz status (Terrill Tiburcio Pea).  Monique [name]. MD, [facility] 11/17/2018 8:26 AM  Findings Coronary Findings Diagnostic  Dominance: Right  Left Anterior Descending Ost LAD to Prox LAD lesion is 95% stenosed. The lesion is calcified. The lesion was previously treated using a drug eluting stent between 6-12 months ago. Previously placed stent displays restenosis.  Ramus Intermedius Ramus lesion is 90% stenosed.  Lateral Ramus Intermedius Lat Ramus lesion is 90% stenosed.  Intervention  No interventions have been documented.   CARDIAC CATHETERIZATION  CARDIAC CATHETERIZATION 05/30/2018  Narrative Images from the  original result were not included.   Ost 1st Mrg to 1st Mrg lesion is 40% stenosed.  Ramus lesion is 80% stenosed.  Ost LAD to Prox LAD lesion is 90% stenosed.  A stent was successfully placed.  Post intervention, there is a 50% residual stenosis.  The left ventricular systolic function is normal.  LV end diastolic pressure is normal.  The left ventricular ejection fraction is 55-65% by visual estimate.  SELEENA REIMERS is a 83 y.o. female   [id number] LOCATION:  FACILITY: [facility] PHYSICIAN: Monique [name], M.D. September 16, 1940   DATE OF PROCEDURE:  05/30/2018  DATE OF DISCHARGE:     CARDIAC CATHETERIZATION / PCI DES LAD    History obtained from chart review.  JACKELYNN HOSIE is a 83 y.o.   moderately overweight widowed Caucasian female mother of 2, grandmother and 2 grandchildren's husband Monique [name] was a long-term patient of mine who died Aug 20, 2016. 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 She was referred by Monique [name] nurse practitioner for a symptomatic sinus tachycardia.  I last saw her in the office 08/20/2017 . Her cardiac risk factors include treated hypertension and hyperlipidemia patient is a family history of heart disease with father who died of a myocardial function and age 64. She has never had a heart attack or stroke. She denies chest pain or shortness of breath. She does exercise and does yoga as well. She has reactive airways disease. She is noted to be tachycardic by her PCP was referred here for further evaluation.  Since I saw her back in January, over the last 6 months she is developed progressive exertional angina and dyspnea on exertion with left upper extremity radiation which is fairly reproducible.  She presents today for outpatient radial diagnostic cardiac catheterization and potential intervention.   PROCEDURE DESCRIPTION:  The patient was brought to the second floor [facility]iac cath lab in the postabsorptive state.  She was premedicated with IV Versed and fentanyl.  Her right wristwas prepped and shaved in usual sterile fashion. Xylocaine 1% was used for local anesthesia. A 6 French sheath was inserted into the radial artery using standard Seldinger technique. The patient received 4500 units  of heparin intravenously.  A 5 [place] TIG catheter and pigtail catheters were used for selective coronary angiography and left ventriculography respectively.  Isovue dye was used for the entirety of the case.  Retrograde aorta, left ventricular and pullback pressures were recorded.  Radial cocktail was administered via the SideArm sheath.  The patient received Angiomax bolus followed by infusion with a therapeutic ACT.  She was already on aspirin  received Plavix 600 mg p.o. in addition to Pepcid 20 mg IV.  Using a 6 [place] XB LAD 3 cm curved guide catheter along with a 0.14 pro-water guidewire and a 2 mm [name] 12 mm balloon the proximal LAD was predilated.  It was calcified fluoroscopically but I decided not to perform atherectomy.  She also had a ramus branch lesion that it was at most a 2 mm vessel that had 80% mid stenosis.  I carefully positioned a 2.5 mm [name] 20 mill meter long Synergy drug-eluting stent across the diseased segment and deployed at 16 atm.  The distal two thirds of the stent deployed without difficulty however the proximal third was resistant to full expansion.  I then postdilated with a 2.5 [name] 12 mm long noncompliant balloon along the entirety of the vessel including the proximal vessel which still did not yield even at 20  atm and change to 2.58 mm balloon which likewise did not yield at 22 atm.  The patient did complain of chest pain with balloon inflation which resolved with balloon deflation.  Unfortunately, the residual stenosis was in the 50% range.  Impression PCI and drug-eluting stenting of a calcified segmental proximal LAD lesion with full expansion of the stent except in the proximal 25 to 30% with residual 50% stenosis.  She also has an 80% ramus branch stenosis and a proximally a 2 mm vessel which I did not intervene on either.  She has normal LV function.  I suspect her anginal symptoms will improve although she does have a suboptimal PCI result.  Angiomax will continue for 4 hours full dose.  The sheath was removed and a TR band was placed on the right wrist to achieve patent hemostasis.  The patient left the lab in stable condition.  She will be gently hydrated, discharged home in the morning on aspirin and Plavix.  Monique [name]. MD, [facility] 05/30/2018 11:40 AM      Recommend uninterrupted dual antiplatelet therapy with Aspirin 81mg  daily and Clopidogrel 75mg  daily for a minimum of 12 months (ACS - Class I  recommendation).  Findings Coronary Findings Diagnostic  Dominance: Right  Left Anterior Descending Ost LAD to Prox LAD lesion is 90% stenosed. Vessel is the culprit lesion. The lesion is calcified.  Ramus Intermedius Ramus lesion is 80% stenosed. Vessel is not the culprit lesion. Relatively small vessel  Left Circumflex  First Obtuse Marginal Branch Ost 1st Mrg to 1st Mrg lesion is 40% stenosed.  Intervention  Ost LAD to Prox LAD lesion Stent A stent was successfully placed. Post-Intervention Lesion Assessment The intervention was unsuccessful. Pre-interventional TIMI flow is 3. Post-intervention TIMI flow is 3. No complications occurred at this lesion. There is a 50% residual stenosis post intervention.     ECHOCARDIOGRAM  ECHOCARDIOGRAM COMPLETE 09/10/2023  Narrative ECHOCARDIOGRAM REPORT    Patient Name:   Monique [name] Date of Exam: 09/10/2023 Medical Rec #:  [id number]         Height:       65.0 in Accession #:    [id number]        Weight:       196.0 lb Date of Birth:  [birth year]          BSA:          1.961 m Patient Age:    82 years          BP:           122/60 mmHg Patient Gender: F                 HR:           76 bpm. Exam Location:  [facility]  Procedure: 2D Echo, Cardiac Doppler and Color Doppler  Indications:    I10 Hypertension, left ventricle dysfunction  History:        Patient has prior history of Echocardiogram examinations, most recent 11/17/2018. CAD, Prior CABG; Risk Factors:Obesity, Diabetes, Hypertension, Dyslipidemia and Non-Smoker.  Sonographer:    Dominica Severin RCS, RVS Referring Phys: [id number] Bettey Mare [place]  IMPRESSIONS   1. Left ventricular ejection fraction, by estimation, is 50 to 55%. The left ventricle has low normal function. The left ventricle has no regional wall motion abnormalities. There is mild concentric left ventricular hypertrophy. Left ventricular diastolic parameters are consistent with Grade I diastolic  dysfunction (impaired relaxation). 2. Right ventricular systolic function  is normal. The right ventricular size is normal. There is normal pulmonary artery systolic pressure. The estimated right ventricular systolic pressure is 29.0 mmHg. 3. The mitral valve is grossly normal. Mild mitral valve regurgitation. 4. The aortic valve is tricuspid. Aortic valve regurgitation is not visualized. No aortic stenosis is present. Aortic valve mean gradient measures 3.0 mmHg. 5. The inferior vena cava is normal in size with greater than 50% respiratory variability, suggesting right atrial pressure of 3 mmHg.  Comparison(s): Prior images reviewed side by side. LVEF 50-55% range. Normal estimated RVSP. Mild mitral regurgitation.  FINDINGS Left Ventricle: Left ventricular ejection fraction, by estimation, is 50 to 55%. The left ventricle has low normal function. The left ventricle has no regional wall motion abnormalities. The left ventricular internal cavity size was normal in size. There is mild concentric left ventricular hypertrophy. Left ventricular diastolic parameters are consistent with Grade I diastolic dysfunction (impaired relaxation).  Right Ventricle: The right ventricular size is normal. No increase in right ventricular wall thickness. Right ventricular systolic function is normal. There is normal pulmonary artery systolic pressure. The tricuspid regurgitant velocity is 2.55 m/s, and with an assumed right atrial pressure of 3 mmHg, the estimated right ventricular systolic pressure is 29.0 mmHg.  Left Atrium: Left atrial size was normal in size.  Right Atrium: Right atrial size was normal in size.  Pericardium: Trivial pericardial effusion is present. The pericardial effusion is posterior to the left ventricle. Presence of epicardial fat layer.  Mitral Valve: The mitral valve is grossly normal. Mild mitral valve regurgitation. MV peak gradient, 2.8 mmHg. The mean mitral valve gradient is 1.0  mmHg.  Tricuspid Valve: The tricuspid valve is grossly normal. Tricuspid valve regurgitation is mild.  Aortic Valve: The aortic valve is tricuspid. There is mild aortic valve annular calcification. Aortic valve regurgitation is not visualized. No aortic stenosis is present. Aortic valve mean gradient measures 3.0 mmHg. Aortic valve peak gradient measures 5.9 mmHg. Aortic valve area, by VTI measures 2.51 cm.  Pulmonic Valve: The pulmonic valve was grossly normal. Pulmonic valve regurgitation is trivial.  Aorta: The aortic root and ascending aorta are structurally normal, with no evidence of dilitation.  Venous: The inferior vena cava is normal in size with greater than 50% respiratory variability, suggesting right atrial pressure of 3 mmHg.  IAS/Shunts: No atrial level shunt detected by color flow Doppler.   LEFT VENTRICLE PLAX 2D LVIDd:         4.80 cm     Diastology LVIDs:         3.50 cm     LV e' medial:    5.77 cm/s LV PW:         1.10 cm     LV E/e' medial:  12.8 LV IVS:        1.10 cm     LV e' lateral:   6.53 cm/s LVOT diam:     2.00 cm     LV E/e' lateral: 11.3 LV SV:         59 LV SV Index:   30 LVOT Area:     3.14 cm  LV Volumes (MOD) LV vol d, MOD A2C: 47.7 ml LV vol s, MOD A2C: 21.6 ml LV SV MOD A2C:     26.1 ml  RIGHT VENTRICLE RV Basal diam:  3.20 cm RV Mid diam:    2.50 cm RV S prime:     10.00 cm/s TAPSE (M-mode): 1.7 cm  LEFT ATRIUM  Index        RIGHT ATRIUM           Index LA diam:        4.20 cm 2.14 cm/m   RA Area:     14.60 cm LA Vol (A2C):   51.8 ml 26.41 ml/m  RA Volume:   32.30 ml  16.47 ml/m LA Vol (A4C):   46.1 ml 23.51 ml/m LA Biplane Vol: 48.6 ml 24.78 ml/m AORTIC VALVE                    PULMONIC VALVE AV Area (Vmax):    2.38 cm     PV Vmax:       0.79 m/s AV Area (Vmean):   2.30 cm     PV Peak grad:  2.5 mmHg AV Area (VTI):     2.51 cm AV Vmax:           121.00 cm/s AV Vmean:          86.100 cm/s AV VTI:             0.235 m AV Peak Grad:      5.9 mmHg AV Mean Grad:      3.0 mmHg LVOT Vmax:         91.70 cm/s LVOT Vmean:        63.000 cm/s LVOT VTI:          0.188 m LVOT/AV VTI ratio: 0.80  AORTA Ao Root diam: 2.90 cm Ao Asc diam:  2.80 cm  MITRAL VALVE               TRICUSPID VALVE MV Area (PHT): 2.82 cm    TR Peak grad:   26.0 mmHg MV Area VTI:   2.57 cm    TR Vmax:        255.00 cm/s MV Peak grad:  2.8 mmHg MV Mean grad:  1.0 mmHg    SHUNTS MV Vmax:       0.84 m/s    Systemic VTI:  0.19 m MV Vmean:      55.2 cm/s   Systemic Diam: 2.00 cm MV Decel Time: 269 msec MV E velocity: 73.70 cm/s MV A velocity: 87.30 cm/s MV E/A ratio:  0.84  Nona Dell MD Electronically signed by Nona Dell MD Signature Date/Time: 09/10/2023/1:00:53 PM    Final   TEE  ECHO INTRAOPERATIVE TEE 11/21/2018  Narrative *INTRAOPERATIVE TRANSESOPHAGEAL REPORT *    Patient Name:   Monique [name] Date of Exam: 11/21/2018 Medical Rec #:  [id number]         Height:       65.0 in Accession #:    [id number]        Weight:       182.7 lb Date of Birth:  [birth year]          BSA:          1.90 m Patient Age:    78 years          BP:           157/74 mmHg Patient Gender: F                 HR:           88 bpm. Exam Location:  Anesthesiology  Transesophogeal exam was perform intraoperatively during surgical procedure. Patient was closely monitored under general anesthesia during the entirety of examination.  Indications:     Coronary artery  disease History:         Risk Factors: Hypertension and Dyslipidemia. Performing Phys: Alleen Borne Diagnosing Phys: Karna Christmas MD  Complications: No known complications during this procedure. POST-OP IMPRESSIONS Overall, there were no significant changes from pre-bypass. - Left Ventricle: The left ventricle is unchanged from pre-bypass. - Right Ventricle: The right ventricle appears unchanged from pre-bypass. - Aorta: The aorta appears unchanged from  pre-bypass. - Left Atrium: The left atrium appears unchanged from pre-bypass. - Left Atrial Appendage: The left atrial appendage appears unchanged from pre-bypass. - Aortic Valve: The aortic valve appears unchanged from pre-bypass. - Mitral Valve: There is mild regurgitation. - Tricuspid Valve: The tricuspid valve appears unchanged from pre-bypass. - Interatrial Septum: The interatrial septum appears unchanged from pre-bypass. - Interventricular Septum: The interventricular septum appears unchanged from pre-bypass. - Pericardium: The pericardium appears unchanged from pre-bypass.  PRE-OP FINDINGS Left Ventricle: The left ventricle has hyperdynamic systolic function, with an ejection fraction of >65%. The cavity size was normal. There is no increase in left ventricular wall thickness. No evidence of left ventricular regional wall motion abnormalities. Right Ventricle: The right ventricle has normal systolic function. The cavity was normal. There is no increase in right ventricular wall thickness. Left Atrium: Left atrial size was dilated. The left atrial appendage is well visualized and there is no evidence of thrombus present. Right Atrium: Right atrial size was normal in size. Right atrial pressure is estimated at 10 mmHg. Interatrial Septum: No atrial level shunt detected by color flow Doppler. Pericardium: Trivial pericardial effusion is present. The pericardial effusion is LV-anterior. Mitral Valve: The mitral valve is normal in structure. Mitral valve regurgitation is trivial by color flow Doppler. The MR jet is centrally-directed. There is no evidence of mitral valve vegetation. Tricuspid Valve: The tricuspid valve was normal in structure. Tricuspid valve regurgitation is trivial by color flow Doppler. The jet is directed centrally. Aortic Valve: The aortic valve is normal in structure. Aortic valve regurgitation was not visualized by color flow Doppler. There is no stenosis of the aortic  valve. There is no evidence of a vegetation on the aortic valve. Pulmonic Valve: The pulmonic valve was normal in structure. Pulmonic valve regurgitation is not visualized by color flow Doppler.  Aorta: The ascending aorta, aortic root and aortic arch are normal in size and structure.    Karna Christmas MD Electronically signed by Karna Christmas MD Signature Date/Time: 11/21/2018/5:26:55 PM    Final        ______________________________________________________________________________________________      Risk Assessment/Calculations:            Physical Exam:   VS:  BP 124/72   Pulse 78   Ht 5\' 5"  (1.651 m)   Wt 194 lb 6.4 oz (88.2 kg)   SpO2 98%   BMI 32.35 kg/m    Wt Readings from Last 3 Encounters:  09/20/23 194 lb 6.4 oz (88.2 kg)  09/10/23 193 lb 12.8 oz (87.9 kg)  09/09/23 196 lb (88.9 kg)    GEN: Well nourished, well developed in no acute distress NECK: No JVD; No carotid bruits CARDIAC: RRR, no murmurs, rubs, gallops RESPIRATORY:  Clear to auscultation without rales, wheezing or rhonchi  ABDOMEN: Soft, non-tender, non-distended EXTREMITIES:  No edema; No deformity   ASSESSMENT AND PLAN: .    Dyspnea: Recently was treated with Lasix and potassium, however renal function worsening, both Lasix and potassium will be changed to as needed.  Her dyspnea has resolved.  Given resolution of  her symptoms, I will hold off on the stress test for the time being.  She is aware to contact cardiology service if her dyspnea come back, I would have low threshold of ordering a stress test since dyspnea was her original anginal symptom prior to bypass surgery  CAD s/p CABG: Denies any chest pain.  Shortness of breath resolved.  Continue atorvastatin  Hypertension: Blood pressure stable  Hyperlipidemia: On atorvastatin  DM2: Hemoglobin A1c improved from 6.8 to 6.4.  Weight loss: She has gained 10 pounds recently and they wish to consider weight loss medication.  With her  diabetes history, she qualifies for GLP-1.  Will refer her to our clinical pharmacist.        Dispo: Follow-up in 54-month, earlier if shortness of breath come back.  Signed, Monique Course, PA

## 2023-09-20 NOTE — Patient Instructions (Signed)
Medication Instructions:  TAKE LASIX AND POTASSIUM AS NEEDED *If you need a refill on your cardiac medications before your next appointment, please call your pharmacy*   Lab Work: NO LABS If you have labs (blood work) drawn today and your tests are completely normal, you will receive your results only by: MyChart Message (if you have MyChart) OR A paper copy in the mail If you have any lab test that is abnormal or we need to change your treatment, we will call you to review the results.   Testing/Procedures: NO TESTING   Follow-Up: At [facility], you and your health needs are our priority.  As part of our continuing mission to provide you with exceptional heart care, we have created designated Provider Care Teams.  These Care Teams include your primary Cardiologist (physician) and Advanced Practice Providers (APPs -  Physician Assistants and Nurse Practitioners) who all work together to provide you with the care you need, when you need it.  Your next appointment:   3 month(s)  Provider:   Azalee Course, PA     Other Instructions You have been referred to Pharm-D for GLP 1 weight loss medication

## 2023-09-23 ENCOUNTER — Non-Acute Institutional Stay: Payer: Medicare Other | Admitting: Nurse Practitioner

## 2023-09-23 ENCOUNTER — Telehealth: Payer: Self-pay

## 2023-09-23 ENCOUNTER — Encounter: Payer: Self-pay | Admitting: Nurse Practitioner

## 2023-09-23 VITALS — BP 122/74 | HR 82 | Temp 97.7°F | Ht 65.0 in | Wt 193.6 lb

## 2023-09-23 DIAGNOSIS — K219 Gastro-esophageal reflux disease without esophagitis: Secondary | ICD-10-CM

## 2023-09-23 DIAGNOSIS — M810 Age-related osteoporosis without current pathological fracture: Secondary | ICD-10-CM

## 2023-09-23 DIAGNOSIS — I10 Essential (primary) hypertension: Secondary | ICD-10-CM | POA: Diagnosis not present

## 2023-09-23 DIAGNOSIS — B370 Candidal stomatitis: Secondary | ICD-10-CM | POA: Insufficient documentation

## 2023-09-23 DIAGNOSIS — E039 Hypothyroidism, unspecified: Secondary | ICD-10-CM | POA: Diagnosis not present

## 2023-09-23 DIAGNOSIS — E1159 Type 2 diabetes mellitus with other circulatory complications: Secondary | ICD-10-CM

## 2023-09-23 DIAGNOSIS — M150 Primary generalized (osteo)arthritis: Secondary | ICD-10-CM

## 2023-09-23 DIAGNOSIS — G629 Polyneuropathy, unspecified: Secondary | ICD-10-CM

## 2023-09-23 DIAGNOSIS — E785 Hyperlipidemia, unspecified: Secondary | ICD-10-CM | POA: Diagnosis not present

## 2023-09-23 DIAGNOSIS — Z951 Presence of aortocoronary bypass graft: Secondary | ICD-10-CM

## 2023-09-23 MED ORDER — MAGIC MOUTHWASH: 5.0000 mL | Freq: Four times a day (QID) | ORAL | 1 refills | Status: DC

## 2023-09-23 NOTE — Assessment & Plan Note (Addendum)
Symbicort, Atrovent nasal spray, improved dyspnea.

## 2023-09-23 NOTE — Assessment & Plan Note (Signed)
s/p L knee replacement, R hip, L shoulder pain-limited ROM, s/p Ortho, no further surgery, on Tramadol. Vit D 50 01/12/23

## 2023-09-23 NOTE — Telephone Encounter (Addendum)
Results viewed by patient via Mychart.----- Message from Joni Reining sent at 09/19/2023  1:20 PM EST ----- I have reviewed the labs. Blood glucose was elevated but the test was not done fasting. Kidney function has worsened compared to previous labs on month ago. Potassium is elevated. Use the lasix and potassium as need for swelling and not every day.

## 2023-09-23 NOTE — Assessment & Plan Note (Signed)
on Levothyroxine, TSH 4.35 08/10/23

## 2023-09-23 NOTE — Assessment & Plan Note (Signed)
 off Gabapentin, placed on Lyrica per patient's request.

## 2023-09-23 NOTE — Progress Notes (Signed)
Location:   clinic [facility]   Place of Service:  Clinic (12) Provider: Arna Snipe Cecilie Heidel NP  Leyana Whidden X, NP  Patient Care Team: Chalice Philbert X, NP as PCP - General (Internal Medicine) Runell Gess, MD as PCP - Cardiology (Cardiology) Jill Side, OD as Referring Physician  Extended Emergency Contact Information Primary Emergency Contact: Johnson,Scott Earnstine [place], [place] [address] [place] Home Phone: [phone] Mobile Phone: [phone] Relation: Son Secondary Emergency Contact: Thomes Dinning, [place] [address] [place] of [place] Home Phone: [phone] Mobile Phone: [phone] Relation: Daughter  Code Status:  DNR Goals of care: Advanced Directive information    09/10/2023   11:00 AM  Advanced Directives  Does Patient Have a Medical Advance Directive? Yes  Type of Estate agent of [place];Living will  Does patient want to make changes to medical advance directive? No - Patient declined  Copy of Healthcare Power of Attorney in Chart? Yes - validated most recent copy scanned in chart (See row information)     Chief Complaint  Patient presents with   Medical Management of Chronic Issues    3 month follow-up. AWV pending for April 2025. Request for eye exam sent to Dr.Barts.     HPI:  Pt is a 83 y.o. female seen today for medical management of chronic diseases.      Hypothyroidism, on Levothyroxine, TSH 4.35 08/10/23             02/12/22 CT abd Cholelithiasis with mild gallbladder wall thickening, LFT wnl 01/12/23. C/o RLQ pain resolves after BM, negative Murphy's sign, denied abd pain associated with nausea, vomiting, she is afebrile.              HTN, controlled, on  Amlodipine, Carvedilol, Lisinopril. Bun/creat 34/1.2 09/16/23             T2DM, A1c 6.4 08/10/23, ACR 11             HLD, taking Atorvastatin, LDL 106 08/10/23             Neuropathy, off Gabapentin, placed on Lyrica per patient's request.               Hx of UTI, s/p kidney stones surgical removal.              OA, s/p L knee replacement, R hip, L shoulder pain-limited ROM, s/p Ortho, no further surgery, on Tramadol. 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              GERD, on Famotidine, Pantoprazole, Sucralfate, Hgb 12.8 08/10/23             Asthma,  Symbicort, Atrovent nasal spray, dyspnea.                Past Medical History:  Diagnosis Date   Anemia     mild as per PCP   Anginal pain (HCC)    06/20/20, relieved    Arthritis    hip/ s/p recent shoulder fracture 8/12- RIGHT   Asthma    Coronary artery disease 05/30/2018   LAD PCI/DES   GI  bleeding    Hiatal hernia    History of kidney stones    Last year had surgery   Humerus fracture    with wrist on right side   Hypertension    hypercholesterolemia/  EKG on chart with clearance and note 06/18/11  Mazzocchi   Pneumonia    Pre-diabetes    Past Surgical History:  Procedure Laterality Date   ABDOMINAL HYSTERECTOMY     COLECTOMY WITH COLOSTOMY CREATION/HARTMANN PROCEDURE N/A 05/07/2016   Procedure: COLOSTOMY CREATION/HARTMANN PROCEDURE;  Surgeon: Romie Levee, MD;  Location: [facility];  Service: General;  Laterality: N/A;   COLONOSCOPY N/A 08/26/2016   Procedure: COLONOSCOPY;  Surgeon: Romie Levee, MD;  Location: [facility];  Service: Endoscopy;  Laterality: N/A;   COLOSTOMY TAKEDOWN N/A 08/27/2016   Procedure: LAPAROSCOPIC COLOSTOMY REVERSAL;  Surgeon: Romie Levee, MD;  Location: [facility];  Service: General;  Laterality: N/A;   CORONARY ARTERY BYPASS GRAFT N/A 11/21/2018   Procedure: CORONARY ARTERY BYPASS GRAFTING (CABG) x Three , using left internal mammary artery and right leg greater saphenous vein harvested endoscopically;  Surgeon: Alleen Borne, MD;  Location: [facility];  Service: Open Heart Surgery;   Laterality: N/A;   CORONARY STENT INTERVENTION N/A 05/30/2018   Procedure: CORONARY STENT INTERVENTION;  Surgeon: Runell Gess, MD;  Location: [facility];  Service: Cardiovascular;  Laterality: N/A;   CYSTOSCOPY WITH RETROGRADE PYELOGRAM, URETEROSCOPY AND STENT PLACEMENT Bilateral 06/21/2020   Procedure: CYSTOSCOPY WITH RETROGRADE PYELOGRAM, AND BILATERAL STENT PLACEMENT;  Surgeon: Sebastian Ache, MD;  Location: [facility];  Service: Urology;  Laterality: Bilateral;   CYSTOSCOPY WITH RETROGRADE PYELOGRAM, URETEROSCOPY AND STENT PLACEMENT Bilateral 07/12/2020   Procedure: CYSTOSCOPY WITH RETROGRADE PYELOGRAM, URETEROSCOPY AND STENT PLACEMENT;  Surgeon: Crist Fat, MD;  Location: [facility];  Service: Urology;  Laterality: Bilateral;  2 HRS   EYE SURGERY     eyelid tuck bilateral   HOLMIUM LASER APPLICATION Bilateral 07/12/2020   Procedure: HOLMIUM LASER APPLICATION;  Surgeon: Crist Fat, MD;  Location: [facility];  Service: Urology;  Laterality: Bilateral;   LAPAROTOMY N/A 05/07/2016   Procedure: EXPLORATORY LAPAROTOMY, LYLSIS OF ADHSIONS, Pam Drown OF PERITONEAL ABSCESS;  Surgeon: Romie Levee, MD;  Location: [facility];  Service: General;  Laterality: N/A;   LEFT HEART CATH AND CORONARY ANGIOGRAPHY N/A 05/30/2018   Procedure: LEFT HEART CATH AND CORONARY ANGIOGRAPHY;  Surgeon: Runell Gess, MD;  [facility]: [facility];  Service: Cardiovascular;  Laterality: N/A;   LEFT HEART CATH AND CORONARY ANGIOGRAPHY N/A 11/17/2018   Procedure: LEFT HEART CATH AND CORONARY ANGIOGRAPHY;  Surgeon: Runell Gess, MD;  [facility];  Service: Cardiovascular;  Laterality: N/A;   PARATHYROIDECTOMY     one lobe- benign per pt   TEE WITHOUT CARDIOVERSION N/A 11/21/2018   Procedure: TRANSESOPHAGEAL ECHOCARDIOGRAM (TEE);  Surgeon: Alleen Borne, MD;  Location: [facility] OR;  Service: Open Heart Surgery;  Laterality: N/A;   TONSILLECTOMY     TOTAL HIP ARTHROPLASTY  07/08/2011    Procedure: TOTAL HIP ARTHROPLASTY;  Surgeon: Gus Rankin Aluisio;  Location: [facility];  Service: Orthopedics;  Laterality: Right;   TOTAL KNEE ARTHROPLASTY Left 02/16/2022   Procedure: TOTAL KNEE ARTHROPLASTY;  Surgeon: Ollen Gross, MD;  Location: [facility];  Service: Orthopedics;  Laterality: Left;    Allergies  Allergen Reactions   Morphine Other (See Comments)    Medication did not work for pain    Penicillin G Other (See Comments)  penicillin G   Clindamycin/Lincomycin    Penicillins Other (See Comments)    UNSPECIFIED REACTION  Unknown reaction as a child.  Tolerated Zosyn 2017. Tolerated Cephalosporin Date: 02/17/22.     Sulfamethoxazole-Trimethoprim     Allergies as of 09/23/2023       Reactions   Morphine Other (See Comments)   Medication did not work for pain    Penicillin G Other (See Comments)   penicillin G   Clindamycin/lincomycin    Penicillins Other (See Comments)   UNSPECIFIED REACTION  Unknown reaction as a child.  Tolerated Zosyn 2017. Tolerated Cephalosporin Date: 02/17/22.   Sulfamethoxazole-trimethoprim         Medication List        Accurate as of September 23, 2023  4:34 PM. If you have any questions, ask your nurse or doctor.          albuterol 108 (90 Base) MCG/ACT inhaler Commonly known as: VENTOLIN HFA Inhale 2 puffs into the lungs every 6 (six) hours as needed for wheezing or shortness of breath.   amLODipine 5 MG tablet Commonly known as: NORVASC TAKE 1 TABLET BY MOUTH DAILY   amLODipine 5 MG tablet Commonly known as: NORVASC Take 1 tablet (5 mg total) by mouth daily.   atorvastatin 80 MG tablet Commonly known as: LIPITOR TAKE 1 TABLET(80 MG) BY MOUTH DAILY   Biotrue Soln Place 1 drop into both eyes as needed (dry eyes).   budesonide-formoterol 80-4.5 MCG/ACT inhaler Commonly known as: SYMBICORT Inhale 2 puffs into the lungs in the morning and at bedtime. Also uses as needed for emergency purposes   carvedilol 12.5 MG  tablet Commonly known as: COREG TAKE 1 TABLET BY MOUTH 2 TIMES A DAY   cetirizine 10 MG tablet Commonly known as: ZYRTEC Take 10 mg by mouth at bedtime.   clopidogrel 75 MG tablet Commonly known as: PLAVIX TAKE 1 TABLET BY MOUTH DAILY   furosemide 20 MG tablet Commonly known as: LASIX Take 1 tablet (20 mg total) by mouth as needed.   ipratropium 0.03 % nasal spray Commonly known as: ATROVENT Place 2 sprays into both nostrils 3 (three) times daily as needed for rhinitis.   levothyroxine 25 MCG tablet Commonly known as: SYNTHROID Take 1 tablet (25 mcg total) by mouth daily.   lisinopril 40 MG tablet Commonly known as: ZESTRIL Take 40 mg by mouth daily.   magic mouthwash Soln Take 5 mLs by mouth 4 (four) times daily. Suspension contains equal amounts of Maalox Extra Strength, nystatin, and diphenhydramine. Started by: Vonte Rossin X Avory Mimbs   MULTIVITAMIN WOMEN 50+ PO Take 1 tablet by mouth daily.   nitroGLYCERIN 0.4 MG SL tablet Commonly known as: NITROSTAT Place 1 tablet (0.4 mg total) under the tongue as needed.   OVER THE COUNTER MEDICATION Apply 1 Application topically as needed (pain). Magna Sport Balm   pantoprazole 40 MG tablet Commonly known as: PROTONIX Take 1 tablet (40 mg total) by mouth 2 (two) times daily before a meal.   Pepcid AC 10 MG tablet Generic drug: famotidine Take 10 mg by mouth daily as needed for heartburn or indigestion.   Phazyme Ultimate 500 MG Caps Generic drug: Simethicone Take 500 mg by mouth daily as needed (bloating).   potassium chloride SA 20 MEQ tablet Commonly known as: KLOR-CON M Take 1 tablet (20 mEq total) by mouth as needed.   pregabalin 25 MG capsule Commonly known as: LYRICA Take 1 capsule (25 mg total) by mouth daily.  sucralfate 1 g tablet Commonly known as: Carafate Take your Protonix at least 30 minutes prior to your first morning dose of Carafate.        Review of Systems  Constitutional:  Negative for appetite  change, fatigue and unexpected weight change.  HENT:  Positive for mouth sores. Negative for congestion, sore throat and trouble swallowing.   Eyes:  Negative for visual disturbance.  Respiratory:  Negative for chest tightness and shortness of breath.        DOE occasionally.   Cardiovascular:  Positive for leg swelling. Negative for chest pain and palpitations.  Gastrointestinal:  Negative for abdominal pain and constipation.       Hx of colon abscess  Genitourinary:  Negative for dysuria, frequency and urgency.       Bathroom trip x1/night, s/p hysterectomy, hx of kidney stones.   Musculoskeletal:  Positive for arthralgias. Negative for gait problem.       Left knee pain, ortho scar tissue, resolved  Skin:  Negative for color change.  Neurological:  Positive for numbness. Negative for dizziness, speech difficulty, weakness and headaches.       Tingling, numbness   Psychiatric/Behavioral:  Negative for behavioral problems and sleep disturbance. The patient is nervous/anxious.     Immunization History  Administered Date(s) Administered   Influenza-Unspecified 05/03/2023   PFIZER(Purple Top)SARS-COV-2 Vaccination 08/22/2019, 09/11/2019, 09/29/2019, 04/29/2020, 05/13/2020   PNEUMOCOCCAL CONJUGATE-20 12/29/2022   Pfizer Covid-19 Vaccine Bivalent Booster 31yrs & up 07/09/2021, 05/03/2023   Pneumococcal Conjugate PCV 7 08/03/2004   Pneumococcal Polysaccharide-23 10/21/2017, 05/31/2018, 08/11/2018, 09/19/2019, 04/29/2020, 12/04/2020, 09/17/2021   RSV,unspecified 05/03/2023   Tdap 02/21/2014   Unspecified SARS-COV-2 Vaccination 09/08/2019, 09/29/2019   Pertinent  Health Maintenance Due  Topic Date Due   OPHTHALMOLOGY EXAM  Never done   DEXA SCAN  Never done   HEMOGLOBIN A1C  02/07/2024   FOOT EXAM  06/16/2024   INFLUENZA VACCINE  Completed      07/30/2023    8:40 AM 08/03/2023   10:51 AM 08/12/2023    3:37 PM 09/03/2023    2:57 PM 09/10/2023   11:00 AM  Fall Risk  Falls in the past  year? 0 0 0 0 0  Was there an injury with Fall? 0 0 0 0 0  Fall Risk Category Calculator 0 0 0 0 0  Patient at Risk for Falls Due to No Fall Risks No Fall Risks  No Fall Risks No Fall Risks  Fall risk Follow up Falls evaluation completed Falls evaluation completed;Education provided;Falls prevention discussed  Falls evaluation completed Falls evaluation completed;Education provided;Falls prevention discussed   Functional Status Survey: Is the patient deaf or have difficulty hearing?: No Does the patient have difficulty seeing, even when wearing glasses/contacts?: No Does the patient have difficulty concentrating, remembering, or making decisions?: No Does the patient have difficulty walking or climbing stairs?: No Does the patient have difficulty dressing or bathing?: No Does the patient have difficulty doing errands alone such as visiting a doctor's office or shopping?: No  Vitals:   09/23/23 1430  BP: 122/74  Pulse: 82  Temp: 97.7 F (36.5 C)  TempSrc: Temporal  SpO2: 98%  Weight: 193 lb 9.6 oz (87.8 kg)  Height: 5\' 5"  (1.651 m)   Body mass index is 32.22 kg/m. Physical Exam Constitutional:      Appearance: Normal appearance.  HENT:     Head: Normocephalic and atraumatic.     Nose: Nose normal.     Mouth/Throat:  Mouth: Mucous membranes are moist.     Pharynx: No posterior oropharyngeal erythema.     Comments: White/yellow coated tongue.  Eyes:     Conjunctiva/sclera: Conjunctivae normal.  Cardiovascular:     Rate and Rhythm: Normal rate and regular rhythm.  Pulmonary:     Effort: Pulmonary effort is normal.     Breath sounds: No wheezing.  Abdominal:     General: Bowel sounds are normal.     Palpations: Abdomen is soft.  Musculoskeletal:        General: Normal range of motion.     Cervical back: Normal range of motion.     Right lower leg: Edema present.     Comments: Trace edema  Skin:    General: Skin is warm and dry.  Neurological:     General: No focal  deficit present.     Mental Status: She is alert and oriented to person, place, and time.  Psychiatric:        Mood and Affect: Mood normal.        Behavior: Behavior normal.        Thought Content: Thought content normal.        Judgment: Judgment normal.     Labs reviewed: Recent Labs    06/01/23 0705 08/10/23 0713 09/16/23 1438  NA 141 141 140  K 4.2 3.7 5.3*  CL 104 104 102  CO2 24 27 20   GLUCOSE 128* 107* 141*  BUN 27* 22 34*  CREATININE 0.66 0.65 1.20*  CALCIUM 9.4 9.3 9.9   Recent Labs    01/12/23 0710 08/10/23 0713  AST 16 18  ALT 16 28  BILITOT 0.9 0.7  PROT 6.6 5.7*   Recent Labs    01/12/23 0710 06/01/23 0705 08/10/23 0713  WBC 6.7 6.7 8.8  NEUTROABS 3,926 4,033 5,350  HGB 13.0 12.9 12.8  HCT 39.2 39.4 39.1  MCV 90.5 91.8 90.9  PLT 169 181 260   Lab Results  Component Value Date   TSH 4.35 08/10/2023   Lab Results  Component Value Date   HGBA1C 6.4 (H) 08/10/2023   Lab Results  Component Value Date   CHOL 186 08/10/2023   HDL 53 08/10/2023   LDLCALC 106 (H) 08/10/2023   TRIG 154 (H) 08/10/2023   CHOLHDL 3.5 08/10/2023    Significant Diagnostic Results in last 30 days:  DG Chest 2 View Result Date: 09/12/2023 CLINICAL DATA:  Shortness of breath.  Cough. EXAM: CHEST - 2 VIEW COMPARISON:  12/21/2018 FINDINGS: Unchanged cardiac silhouette and mediastinal contours post median sternotomy. No focal airspace opacities. No pleural effusion or pneumothorax. No evidence of edema. No acute osseous abnormalities. IMPRESSION: No acute cardiopulmonary disease. Specifically, no evidence of pneumonia. Electronically Signed   By: Simonne Come M.D.   On: 09/12/2023 16:06   ECHOCARDIOGRAM COMPLETE Result Date: 09/10/2023    ECHOCARDIOGRAM REPORT   Patient Name:   Monique [name] Date of Exam: 09/10/2023 Medical Rec #:  [id number]         Height:       65.0 in Accession #:    [id number]        Weight:       196.0 lb Date of Birth:  1940/09/20          BSA:           1.961 m Patient Age:    82 years          BP:  122/60 mmHg Patient Gender: F                 HR:           76 bpm. [facility] Procedure: 2D Echo, Cardiac Doppler and Color Doppler Indications:    I10 Hypertension, left ventricle dysfunction  History:        Patient has prior history of Echocardiogram examinations, most                 recent 11/17/2018. CAD, Prior CABG; Risk Factors:Obesity,                 Diabetes, Hypertension, Dyslipidemia and Non-Smoker.  Sonographer:    Dominica Severin RCS, RVS Referring Phys: [id number] Bettey Mare LAWRENCE IMPRESSIONS  1. Left ventricular ejection fraction, by estimation, is 50 to 55%. The left ventricle has low normal function. The left ventricle has no regional wall motion abnormalities. There is mild concentric left ventricular hypertrophy. Left ventricular diastolic parameters are consistent with Grade I diastolic dysfunction (impaired relaxation).  2. Right ventricular systolic function is normal. The right ventricular size is normal. There is normal pulmonary artery systolic pressure. The estimated right ventricular systolic pressure is 29.0 mmHg.  3. The mitral valve is grossly normal. Mild mitral valve regurgitation.  4. The aortic valve is tricuspid. Aortic valve regurgitation is not visualized. No aortic stenosis is present. Aortic valve mean gradient measures 3.0 mmHg.  5. The inferior vena cava is normal in size with greater than 50% respiratory variability, suggesting right atrial pressure of 3 mmHg. Comparison(s): Prior images reviewed side by side. LVEF 50-55% range. Normal estimated RVSP. Mild mitral regurgitation. FINDINGS  Left Ventricle: Left ventricular ejection fraction, by estimation, is 50 to 55%. The left ventricle has low normal function. The left ventricle has no regional wall motion abnormalities. The left ventricular internal cavity size was normal in size. There is mild concentric left ventricular hypertrophy. Left ventricular  diastolic parameters are consistent with Grade I diastolic dysfunction (impaired relaxation). Right Ventricle: The right ventricular size is normal. No increase in right ventricular wall thickness. Right ventricular systolic function is normal. There is normal pulmonary artery systolic pressure. The tricuspid regurgitant velocity is 2.55 m/s, and  with an assumed right atrial pressure of 3 mmHg, the estimated right ventricular systolic pressure is 29.0 mmHg. Left Atrium: Left atrial size was normal in size. Right Atrium: Right atrial size was normal in size. Pericardium: Trivial pericardial effusion is present. The pericardial effusion is posterior to the left ventricle. Presence of epicardial fat layer. Mitral Valve: The mitral valve is grossly normal. Mild mitral valve regurgitation. MV peak gradient, 2.8 mmHg. The mean mitral valve gradient is 1.0 mmHg. Tricuspid Valve: The tricuspid valve is grossly normal. Tricuspid valve regurgitation is mild. Aortic Valve: The aortic valve is tricuspid. There is mild aortic valve annular calcification. Aortic valve regurgitation is not visualized. No aortic stenosis is present. Aortic valve mean gradient measures 3.0 mmHg. Aortic valve peak gradient measures 5.9 mmHg. Aortic valve area, by VTI measures 2.51 cm. Pulmonic Valve: The pulmonic valve was grossly normal. Pulmonic valve regurgitation is trivial. Aorta: The aortic root and ascending aorta are structurally normal, with no evidence of dilitation. Venous: The inferior vena cava is normal in size with greater than 50% respiratory variability, suggesting right atrial pressure of 3 mmHg. IAS/Shunts: No atrial level shunt detected by color flow Doppler.  LEFT VENTRICLE PLAX 2D LVIDd:  4.80 cm     Diastology LVIDs:         3.50 cm     LV e' medial:    5.77 cm/s LV PW:         1.10 cm     LV E/e' medial:  12.8 LV IVS:        1.10 cm     LV e' lateral:   6.53 cm/s LVOT diam:     2.00 cm     LV E/e' lateral: 11.3 LV SV:          59 LV SV Index:   30 LVOT Area:     3.14 cm  LV Volumes (MOD) LV vol d, MOD A2C: 47.7 ml LV vol s, MOD A2C: 21.6 ml LV SV MOD A2C:     26.1 ml RIGHT VENTRICLE RV Basal diam:  3.20 cm RV Mid diam:    2.50 cm RV S prime:     10.00 cm/s TAPSE (M-mode): 1.7 cm LEFT ATRIUM             Index        RIGHT ATRIUM           Index LA diam:        4.20 cm 2.14 cm/m   RA Area:     14.60 cm LA Vol (A2C):   51.8 ml 26.41 ml/m  RA Volume:   32.30 ml  16.47 ml/m LA Vol (A4C):   46.1 ml 23.51 ml/m LA Biplane Vol: 48.6 ml 24.78 ml/m  AORTIC VALVE                    PULMONIC VALVE AV Area (Vmax):    2.38 cm     PV Vmax:       0.79 m/s AV Area (Vmean):   2.30 cm     PV Peak grad:  2.5 mmHg AV Area (VTI):     2.51 cm AV Vmax:           121.00 cm/s AV Vmean:          86.100 cm/s AV VTI:            0.235 m AV Peak Grad:      5.9 mmHg AV Mean Grad:      3.0 mmHg LVOT Vmax:         91.70 cm/s LVOT Vmean:        63.000 cm/s LVOT VTI:          0.188 m LVOT/AV VTI ratio: 0.80  AORTA Ao Root diam: 2.90 cm Ao Asc diam:  2.80 cm MITRAL VALVE               TRICUSPID VALVE MV Area (PHT): 2.82 cm    TR Peak grad:   26.0 mmHg MV Area VTI:   2.57 cm    TR Vmax:        255.00 cm/s MV Peak grad:  2.8 mmHg MV Mean grad:  1.0 mmHg    SHUNTS MV Vmax:       0.84 m/s    Systemic VTI:  0.19 m MV Vmean:      55.2 cm/s   Systemic Diam: 2.00 cm MV Decel Time: 269 msec MV E velocity: 73.70 cm/s MV A velocity: 87.30 cm/s MV E/A ratio:  0.84 Nona Dell MD Electronically signed by Nona Dell MD Signature Date/Time: 09/10/2023/1:00:53 PM    Final     Assessment/Plan  Hypothyroidism on  Levothyroxine, TSH 4.35 08/10/23  Essential hypertension controlled, on  Amlodipine, Carvedilol, Lisinopril. Bun/creat 34/1.2 09/16/23  Type 2 diabetes mellitus with cardiac complication (HCC) A1c 6.4 08/10/23, ACR 11  Dyslipidemia, goal LDL below 70  taking Atorvastatin, LDL 106 08/10/23  Neuropathy  off Gabapentin, placed on Lyrica per patient's  request.   Osteoarthritis, multiple sites s/p L knee replacement, R hip, L shoulder pain-limited ROM, s/p Ortho, no further surgery, on Tramadol. Vit D 50 01/12/23  S/P CABG (coronary artery bypass graft) s/p CABG x3 11/21/18, (prior stent drug eluting), hx of tachycardia, on oral nitrate, Plavix, Carvedilol, Lisinopril, followed by cardiology.   GERD (gastroesophageal reflux disease) on Famotidine, Pantoprazole, Sucralfate, Hgb 12.8 08/10/23  Asthma Symbicort, Atrovent nasal spray, improved dyspnea.   Oral candidiasis Magic mouth wash 5mg  qc and hs s/s   Family/ staff Communication: plan of care reviewed with the patient   Labs/tests ordered:  none

## 2023-09-23 NOTE — Assessment & Plan Note (Signed)
controlled, on  Amlodipine, Carvedilol, Lisinopril. Bun/creat 34/1.2 09/16/23

## 2023-09-23 NOTE — Assessment & Plan Note (Signed)
taking Atorvastatin, LDL 106 08/10/23

## 2023-09-23 NOTE — Assessment & Plan Note (Signed)
Magic mouth wash 5mg  qc and hs s/s

## 2023-09-23 NOTE — Patient Instructions (Addendum)
1.) Visit your local pharmacy to receive your zoster vaccine (shingles), if you have not received the 2 recommended doses.

## 2023-09-23 NOTE — Assessment & Plan Note (Signed)
A1c 6.4 08/10/23, ACR 11

## 2023-09-23 NOTE — Assessment & Plan Note (Signed)
s/p CABG x3 11/21/18, (prior stent drug eluting), hx of tachycardia, on oral nitrate, Plavix, Carvedilol, Lisinopril, followed by cardiology.

## 2023-09-23 NOTE — Assessment & Plan Note (Signed)
on Famotidine, Pantoprazole, Sucralfate, Hgb 12.8 08/10/23

## 2023-09-24 ENCOUNTER — Telehealth: Payer: Self-pay | Admitting: Emergency Medicine

## 2023-09-24 MED ORDER — MAGIC MOUTHWASH: 5.0000 mL | Freq: Four times a day (QID) | ORAL | 1 refills | Status: DC

## 2023-09-24 MED ORDER — NYSTATIN 100000 UNIT/ML MT SUSP: OROMUCOSAL | 0 refills | Status: DC

## 2023-09-24 MED ORDER — DIPHENHYDRAMINE HCL 12.5 MG/5ML PO LIQD: ORAL | 0 refills | Status: DC

## 2023-09-24 NOTE — Addendum Note (Signed)
Addended by: Kathreen Devoid on: 09/24/2023 04:36 PM   Modules accepted: Orders

## 2023-09-24 NOTE — Telephone Encounter (Signed)
Patient called requesting magic mouth wash be sent [facility] because her pharmacy doesn't make that compound.

## 2023-09-24 NOTE — Addendum Note (Signed)
Addended by: Maurice Small on: 09/24/2023 12:47 PM   Modules accepted: Orders

## 2023-10-18 ENCOUNTER — Other Ambulatory Visit: Payer: Self-pay | Admitting: Cardiovascular Disease

## 2023-11-01 ENCOUNTER — Ambulatory Visit (INDEPENDENT_AMBULATORY_CARE_PROVIDER_SITE_OTHER): Payer: Medicare Other

## 2023-11-04 LAB — HM DEXA SCAN: HM Dexa Scan: NORMAL

## 2023-11-08 ENCOUNTER — Other Ambulatory Visit (HOSPITAL_COMMUNITY): Payer: Self-pay

## 2023-11-08 ENCOUNTER — Ambulatory Visit
Payer: Medicare Other | Attending: Cardiovascular Disease | Admitting: Pharmacist Clinician (PhC)/ Clinical Pharmacy Specialist

## 2023-11-08 ENCOUNTER — Telehealth: Payer: Self-pay | Admitting: Pharmacist Clinician (PhC)/ Clinical Pharmacy Specialist

## 2023-11-08 ENCOUNTER — Telehealth: Payer: Self-pay | Admitting: Pharmacy Technician

## 2023-11-08 ENCOUNTER — Encounter: Payer: Self-pay | Admitting: Pharmacist Clinician (PhC)/ Clinical Pharmacy Specialist

## 2023-11-08 VITALS — Ht 65.0 in | Wt 195.4 lb

## 2023-11-08 DIAGNOSIS — E669 Obesity, unspecified: Secondary | ICD-10-CM | POA: Diagnosis present

## 2023-11-08 DIAGNOSIS — E1159 Type 2 diabetes mellitus with other circulatory complications: Secondary | ICD-10-CM | POA: Insufficient documentation

## 2023-11-08 NOTE — Telephone Encounter (Signed)
 Patient with Dx DM2.  Please do PA for Zepbound

## 2023-11-08 NOTE — Progress Notes (Signed)
 Office Visit    Patient Name: Monique [name] Date of Encounter: 11/08/2023  Primary Care Provider:  Mast, Man X, NP Primary Cardiologist:  [name], MD  Chief Complaint    Weight management  Significant Past Medical History   DM2 Dx 04/2023, A1c 6.8  CAD  10/19 - cath w/ DES to pLAD; in-stent restenosis, 11/2018 CABG x 3  HTN Controlled at last visit, on amlodipine, carvedilol, lisinopril  HLD 1/25 LDL 106, on atorvastatin 80       Allergies  Allergen Reactions   Morphine Other (See Comments)    Medication did not work for pain    Penicillin G Other (See Comments)    penicillin G   Clindamycin/Lincomycin    Penicillins Other (See Comments)    UNSPECIFIED REACTION  Unknown reaction as a child.  Tolerated Zosyn 2017. Tolerated Cephalosporin Date: 02/17/22.     Sulfamethoxazole-Trimethoprim     History of Present Illness    Monique [name] is a 83 y.o. female patient of Dr Allyson Sabal, in the office today to discuss options for weight management.  She was referred to PharmD clinic by Azalee Course PA, who saw the patient in February.   Ms Heffernan reports that she has slowly and steadily gained weight since having children.  She did manage to lose about 25 pounds after having knee surgery, but then moved into [facility] and noted that she gained it all back rather easily.  More recently she has been having problems with asthma and took steroid pulses 3 times.  Each added close to 10 pounds.    Current weight management medications: none  Previously tried meds: no meds, various diets  Current meds that may affect weight: none (occasional steroid pulse)  Baseline weight/BMI: 88.6 kg // 32.52  Insurance payor: [employer] PDP  [facility]:  [facility] [employer]  Diet: breakfast most days in her apartment - oatmeal or cold cereal, toast - fairly light.  Doesn't usually eat lunch.  Dinner in [employer] around 5;  has cut out deserts and breads, no gravies.   Variety of proteins and lots of vegetables  Exercise: PT after knee, continues to pay for it on her own at [facility]; does recumbent bike and exercises  Family History: dad died from MI at 77, his brothers also had MI's and died mostly in 50-60's. Only 1 lived to 49.  Had 1 sister without heart issues; mother died at 57, no known heart history (uterine cancer); no siblings.  Daughter 88, son 80 neither with heart issues, both health conscious.    Social History:   Tobacco: no  Alcohol: occasional/rare  Accessory Clinical Findings    Lab Results  Component Value Date   CREATININE 1.20 (H) 09/16/2023   BUN 34 (H) 09/16/2023   NA 140 09/16/2023   K 5.3 (H) 09/16/2023   CL 102 09/16/2023   CO2 20 09/16/2023   Lab Results  Component Value Date   ALT 28 08/10/2023   AST 18 08/10/2023   ALKPHOS 70 02/11/2022   BILITOT 0.7 08/10/2023   Lab Results  Component Value Date   HGBA1C 6.4 (H) 08/10/2023      Home Medications/Allergies    Current Outpatient Medications  Medication Sig Dispense Refill   albuterol (VENTOLIN HFA) 108 (90 Base) MCG/ACT inhaler Inhale 2 puffs into the lungs every 6 (six) hours as needed for wheezing or shortness of breath. 18 g 2   amLODipine (NORVASC) 5 MG  tablet TAKE 1 TABLET BY MOUTH DAILY (Patient not taking: Reported on 09/23/2023) 30 tablet 0   amLODipine (NORVASC) 5 MG tablet Take 1 tablet (5 mg total) by mouth daily. 30 tablet 0   atorvastatin (LIPITOR) 80 MG tablet TAKE 1 TABLET(80 MG) BY MOUTH DAILY 30 tablet 10   budesonide-formoterol (SYMBICORT) 80-4.5 MCG/ACT inhaler Inhale 2 puffs into the lungs in the morning and at bedtime. Also uses as needed for emergency purposes     carvedilol (COREG) 12.5 MG tablet TAKE 1 TABLET BY MOUTH 2 TIMES A DAY 180 tablet 3   cetirizine (ZYRTEC) 10 MG tablet Take 10 mg by mouth at bedtime.     clopidogrel (PLAVIX) 75 MG tablet TAKE 1 TABLET BY MOUTH DAILY 90 tablet 3   famotidine (PEPCID AC) 10 MG tablet Take 10 mg by  mouth daily as needed for heartburn or indigestion.     furosemide (LASIX) 20 MG tablet Take 1 tablet (20 mg total) by mouth as needed.     ipratropium (ATROVENT) 0.03 % nasal spray Place 2 sprays into both nostrils 3 (three) times daily as needed for rhinitis.     levothyroxine (SYNTHROID) 25 MCG tablet Take 1 tablet (25 mcg total) by mouth daily. 90 tablet 3   lisinopril (PRINIVIL,ZESTRIL) 40 MG tablet Take 40 mg by mouth daily.     magic mouthwash (nystatin, diphenhydrAMINE, alum & mag hydroxide) suspension mixture Take 5 mLs by mouth 4 (four) times daily. Suspension contains equal amounts of Maalox Extra Strength, nystatin, and diphenhydramine. 240 mL 0   Multiple Vitamins-Minerals (MULTIVITAMIN WOMEN 50+ PO) Take 1 tablet by mouth daily.     nitroGLYCERIN (NITROSTAT) 0.4 MG SL tablet Place 1 tablet (0.4 mg total) under the tongue as needed. 25 tablet 3   OVER THE COUNTER MEDICATION Apply 1 Application topically as needed (pain). Magna Sport Balm     pantoprazole (PROTONIX) 40 MG tablet Take 1 tablet (40 mg total) by mouth 2 (two) times daily before a meal. 30 tablet 0   potassium chloride SA (KLOR-CON M) 20 MEQ tablet Take 1 tablet (20 mEq total) by mouth as needed.     pregabalin (LYRICA) 25 MG capsule Take 1 capsule (25 mg total) by mouth daily. 30 capsule 5   Simethicone (PHAZYME ULTIMATE) 500 MG CAPS Take 500 mg by mouth daily as needed (bloating).     Soft Lens Products (BIOTRUE) SOLN Place 1 drop into both eyes as needed (dry eyes).     sucralfate (CARAFATE) 1 g tablet Take your Protonix at least 30 minutes prior to your first morning dose of Carafate. 40 tablet 0   No current facility-administered medications for this visit.     Allergies  Allergen Reactions   Morphine Other (See Comments)    Medication did not work for pain    Penicillin G Other (See Comments)    penicillin G   Clindamycin/Lincomycin    Penicillins Other (See Comments)    UNSPECIFIED REACTION  Unknown  reaction as a child.  Tolerated Zosyn 2017. Tolerated Cephalosporin Date: 02/17/22.     Sulfamethoxazole-Trimethoprim     Assessment & Plan    Type 2 diabetes mellitus with cardiac complication [facility]) Patient with DM2 as well as need for weight management.    Advised patient on common side effects including nausea, diarrhea, dyspepsia, decreased appetite, and fatigue. Counseled patient on reducing meal size and how to titrate medication to minimize side effects. Patient aware to call if intolerable side effects or  if experiencing dehydration, abdominal pain, or dizziness. Patient will adhere to dietary modifications and will target at least 150 minutes of moderate intensity exercise weekly.   Injection technique reviewed at today's visit.    Titration Plan:  Will plan to follow the titration plan as below, pending patient is tolerating each dose before increasing to the next. Can slow titration if needed for tolerability.    -Month 1: Inject 2.5 SQ once weekly x 4 weeks -Month 2: Inject 5 SQ once weekly x 4 weeks -Month 3: Inject 7.5 SQ once weekly x 4 weeks -Month 4+: Inject 10 SQ once weekly   Follow up in 3 months.    Phillips Hay PharmD CPP [facility]  [address] [place], [place] [address] [phone]

## 2023-11-08 NOTE — Patient Instructions (Signed)
We will start the prior authorization process to get [facility] covered by your insurance.   TIPS FOR SUCCESS Write down the reasons why you want to lose weight and post it in a place where you'll see it often. Start small and work your way up. Keep in mind that it takes time to achieve goals, and small steps add up. Any additional movements help to burn calories. Taking the stairs rather than the elevator and parking at the far end of your parking lot are easy ways to start. Brisk walking for at least 30 minutes 4 or more days of the week is an excellent goal to work toward  [employer] WHAT IT MEANS TO FEEL FULL Did you know that it can take 15 minutes or more for your brain to receive the message that you've eaten? That means that, if you eat less food, but consume it slower, you may still feel satisfied. Eating a lot of fruits and vegetables can also help you feel fuller. Eat off of smaller plates so that moderate portions don't seem too small  TITRATION PLAN Will plan to follow the titration plan as below, pending patient is tolerating each dose before increasing to the next. Can slow titration if needed for tolerability.    -Weeks 1-4: Inject 2.5 mg SQ once weekly  -Weeks 5-8: Inject 5 mg SQ once weekly  -Weeks 9-12: Inject 7.5 mg SQ once weekly  -Weeks 13-16: Inject 10 mg SQ once weekly   Follow up in 3 months.  If you have any questions or concerns, please reach out to [place].  Oneka Parada/Chris at [phone].  THANK YOU FOR CHOOSING [facility]

## 2023-11-08 NOTE — Telephone Encounter (Signed)
 Pharmacy Patient Advocate Encounter   Received notification from Pt Calls Messages that prior authorization for zepbound is required/requested.   Insurance verification completed.   The patient is insured through [facility] .   Per test claim: PA required; PA submitted to above mentioned insurance via CoverMyMeds Key/confirmation #/EOC [facility] Status is pending

## 2023-11-08 NOTE — Assessment & Plan Note (Signed)
 Patient with DM2 as well as need for weight management.    Advised patient on common side effects including nausea, diarrhea, dyspepsia, decreased appetite, and fatigue. Counseled patient on reducing meal size and how to titrate medication to minimize side effects. Patient aware to call if intolerable side effects or if experiencing dehydration, abdominal pain, or dizziness. Patient will adhere to dietary modifications and will target at least 150 minutes of moderate intensity exercise weekly.   Injection technique reviewed at today's visit.    Titration Plan:  Will plan to follow the titration plan as below, pending patient is tolerating each dose before increasing to the next. Can slow titration if needed for tolerability.    -Month 1: Inject 2.5 SQ once weekly x 4 weeks -Month 2: Inject 5 SQ once weekly x 4 weeks -Month 3: Inject 7.5 SQ once weekly x 4 weeks -Month 4+: Inject 10 SQ once weekly   Follow up in 3 months.

## 2023-11-09 ENCOUNTER — Encounter: Payer: Self-pay | Admitting: Pharmacist Clinician (PhC)/ Clinical Pharmacy Specialist

## 2023-11-09 NOTE — Telephone Encounter (Signed)
 Pharmacy Patient Advocate Encounter  Received notification from [facility] that Prior Authorization for zepbound has been DENIED.  Full denial letter will be uploaded to the media tab. See denial reason below.   I put for DM and they still denied it for this reason

## 2023-11-09 NOTE — Telephone Encounter (Signed)
 Follow Up:     Angella called and said patient's Zepbound was denied.

## 2023-11-10 ENCOUNTER — Other Ambulatory Visit (HOSPITAL_COMMUNITY): Payer: Self-pay

## 2023-11-10 ENCOUNTER — Telehealth: Payer: Self-pay | Admitting: Pharmacy Technician

## 2023-11-10 NOTE — Telephone Encounter (Signed)
 PA request has been Submitted. New Encounter has been or will be created for follow up. For additional info see Pharmacy Prior Auth telephone encounter from 11/10/23.

## 2023-11-10 NOTE — Telephone Encounter (Signed)
 Pharmacy Patient Advocate Encounter   Received notification from Pt Calls Messages that prior authorization for mounjaro is required/requested.   Insurance verification completed.   The patient is insured through [facility] .   Per test claim: PA required; PA submitted to above mentioned insurance via CoverMyMeds Key/confirmation #/[id number] Status is pending

## 2023-11-11 ENCOUNTER — Encounter: Payer: Self-pay | Admitting: Nurse Practitioner

## 2023-11-11 ENCOUNTER — Other Ambulatory Visit (HOSPITAL_COMMUNITY): Payer: Self-pay

## 2023-11-11 ENCOUNTER — Non-Acute Institutional Stay: Payer: Medicare Other | Admitting: Nurse Practitioner

## 2023-11-11 VITALS — BP 128/76 | HR 81 | Temp 97.8°F | Resp 19 | Ht 65.0 in | Wt 197.6 lb

## 2023-11-11 DIAGNOSIS — Z0000 Encounter for general adult medical examination without abnormal findings: Secondary | ICD-10-CM

## 2023-11-11 NOTE — Telephone Encounter (Signed)
 Pharmacy Patient Advocate Encounter  Received notification from [facility] that Prior Authorization for mounjaro has been APPROVED from 11/10/23 to 11/09/24. Ran test claim, Copay is $45.00. This test claim was processed through [facility]- copay amounts may vary at other pharmacies due to pharmacy/plan contracts, or as the patient moves through the different stages of their insurance plan.   PA #/Case ID/Reference #: [id number]

## 2023-11-11 NOTE — Progress Notes (Signed)
 Subjective:   Monique [name] is a 83 y.o. female who presents for Medicare Annual (Subsequent) preventive examination.  Visit Complete: In person  Patient Medicare AWV questionnaire was completed by the patient on 11/11/23; I have confirmed that all information answered by patient is correct and no changes since this date.  Cardiac Risk Factors include: advanced age (>21men, >36 women);diabetes mellitus;dyslipidemia;family history of premature cardiovascular disease;hypertension;obesity (BMI >30kg/m2)     Objective:    Today's Vitals   11/11/23 1426  BP: 128/76  Pulse: 81  Resp: 19  Temp: 97.8 F (36.6 C)  SpO2: 95%  Weight: 197 lb 9.6 oz (89.6 kg)  Height: 5\' 5"  (1.651 m)   Body mass index is 32.88 kg/m.     11/11/2023    2:19 PM 09/10/2023   11:00 AM 08/12/2023    3:38 PM 08/03/2023   10:51 AM 07/30/2023    9:47 AM 06/17/2023    3:31 PM 06/17/2023    8:40 AM  Advanced Directives  Does Patient Have a Medical Advance Directive? Yes Yes Yes Yes Yes Yes Yes  Type of Estate agent of [place];Living will Healthcare Power of [place];Living will Healthcare Power of [place];Living will Healthcare Power of [place];Living will Healthcare Power of [place];Living will Healthcare Power of [place];Living will Healthcare Power of [place];Living will  Does patient want to make changes to medical advance directive? No - Patient declined No - Patient declined No - Patient declined No - Patient declined No - Patient declined No - Patient declined No - Patient declined  Copy of Healthcare Power of Attorney in Chart? Yes - validated most recent copy scanned in chart (See row information) Yes - validated most recent copy scanned in chart (See row information) Yes - validated most recent copy scanned in chart (See row information) Yes - validated most recent copy scanned in chart (See row information) Yes - validated most recent copy scanned in chart (See row  information) Yes - validated most recent copy scanned in chart (See row information) Yes - validated most recent copy scanned in chart (See row information)    Current Medications (verified) Outpatient Encounter Medications as of 11/11/2023  Medication Sig   albuterol (VENTOLIN HFA) 108 (90 Base) MCG/ACT inhaler Inhale 2 puffs into the lungs every 6 (six) hours as needed for wheezing or shortness of breath.   amLODipine (NORVASC) 5 MG tablet Take 1 tablet (5 mg total) by mouth daily.   atorvastatin (LIPITOR) 80 MG tablet TAKE 1 TABLET(80 MG) BY MOUTH DAILY   budesonide-formoterol (SYMBICORT) 80-4.5 MCG/ACT inhaler Inhale 2 puffs into the lungs in the morning and at bedtime. Also uses as needed for emergency purposes   carvedilol (COREG) 12.5 MG tablet TAKE 1 TABLET BY MOUTH 2 TIMES A DAY   cetirizine (ZYRTEC) 10 MG tablet Take 10 mg by mouth at bedtime.   clopidogrel (PLAVIX) 75 MG tablet TAKE 1 TABLET BY MOUTH DAILY   famotidine (PEPCID AC) 10 MG tablet Take 10 mg by mouth daily as needed for heartburn or indigestion.   furosemide (LASIX) 20 MG tablet Take 1 tablet (20 mg total) by mouth as needed.   ipratropium (ATROVENT) 0.03 % nasal spray Place 2 sprays into both nostrils 3 (three) times daily as needed for rhinitis.   levothyroxine (SYNTHROID) 25 MCG tablet Take 1 tablet (25 mcg total) by mouth daily.   lisinopril (PRINIVIL,ZESTRIL) 40 MG tablet Take 40 mg by mouth daily.   magic mouthwash (nystatin, diphenhydrAMINE, alum & mag  hydroxide) suspension mixture Take 5 mLs by mouth 4 (four) times daily. Suspension contains equal amounts of Maalox Extra Strength, nystatin, and diphenhydramine.   Multiple Vitamins-Minerals (MULTIVITAMIN WOMEN 50+ PO) Take 1 tablet by mouth daily.   nitroGLYCERIN (NITROSTAT) 0.4 MG SL tablet Place 1 tablet (0.4 mg total) under the tongue as needed.   OVER THE COUNTER MEDICATION Apply 1 Application topically as needed (pain). Magna Sport Balm   pantoprazole  (PROTONIX) 40 MG tablet Take 1 tablet (40 mg total) by mouth 2 (two) times daily before a meal.   potassium chloride SA (KLOR-CON M) 20 MEQ tablet Take 1 tablet (20 mEq total) by mouth as needed.   pregabalin (LYRICA) 25 MG capsule Take 1 capsule (25 mg total) by mouth daily.   Simethicone (PHAZYME ULTIMATE) 500 MG CAPS Take 500 mg by mouth daily as needed (bloating).   Soft Lens Products (BIOTRUE) SOLN Place 1 drop into both eyes as needed (dry eyes).   sucralfate (CARAFATE) 1 g tablet Take your Protonix at least 30 minutes prior to your first morning dose of Carafate.   amLODipine (NORVASC) 5 MG tablet TAKE 1 TABLET BY MOUTH DAILY (Patient not taking: Reported on 11/11/2023)   No facility-administered encounter medications on file as of 11/11/2023.    Allergies (verified) Morphine, Penicillin g, Clindamycin/lincomycin, Penicillins, and Sulfamethoxazole-trimethoprim   History: Past Medical History:  Diagnosis Date   Anemia     mild as per PCP   Anginal pain (HCC)    06/20/20, relieved    Arthritis    hip/ s/p recent shoulder fracture 8/12- RIGHT   Asthma    Coronary artery disease 05/30/2018   LAD PCI/DES   GI bleeding    Hiatal hernia    History of kidney stones    Last year had surgery   Humerus fracture    with wrist on right side   Hypertension    hypercholesterolemia/  EKG on chart with clearance and note 06/18/11  Mazzocchi   Pneumonia    Pre-diabetes    Past Surgical History:  Procedure Laterality Date   ABDOMINAL HYSTERECTOMY     COLECTOMY WITH COLOSTOMY CREATION/HARTMANN PROCEDURE N/A 05/07/2016   Procedure: COLOSTOMY CREATION/HARTMANN PROCEDURE;  Surgeon: Romie Levee, MD;  Location: [facility];  Service: General;  Laterality: N/A;   COLONOSCOPY N/A 08/26/2016   Procedure: COLONOSCOPY;  Surgeon: Romie Levee, MD;  [facility]: [facility];  Service: Endoscopy;  Laterality: N/A;   COLOSTOMY TAKEDOWN N/A 08/27/2016   Procedure: LAPAROSCOPIC COLOSTOMY REVERSAL;  Surgeon:  Romie Levee, MD;  Location: [facility];  Service: General;  Laterality: N/A;   CORONARY ARTERY BYPASS GRAFT N/A 11/21/2018   Procedure: CORONARY ARTERY BYPASS GRAFTING (CABG) x Three , using left internal mammary artery and right leg greater saphenous vein harvested endoscopically;  Surgeon: Alleen Borne, MD;  Location: [facility];  Service: Open Heart Surgery;  Laterality: N/A;   CORONARY STENT INTERVENTION N/A 05/30/2018   Procedure: CORONARY STENT INTERVENTION;  Surgeon: Runell Gess, MD;  [facility]: [facility];  Service: Cardiovascular;  Laterality: N/A;   CYSTOSCOPY WITH RETROGRADE PYELOGRAM, URETEROSCOPY AND STENT PLACEMENT Bilateral 06/21/2020   Procedure: CYSTOSCOPY WITH RETROGRADE PYELOGRAM, AND BILATERAL STENT PLACEMENT;  Surgeon: Sebastian Ache, MD;  Location: [facility];  Service: Urology;  Laterality: Bilateral;   CYSTOSCOPY WITH RETROGRADE PYELOGRAM, URETEROSCOPY AND STENT PLACEMENT Bilateral 07/12/2020   Procedure: CYSTOSCOPY WITH RETROGRADE PYELOGRAM, URETEROSCOPY AND STENT PLACEMENT;  Surgeon: Crist Fat, MD;  Location: [facility];  Service: Urology;  Laterality: Bilateral;  2 HRS   EYE SURGERY     eyelid tuck bilateral   HOLMIUM LASER APPLICATION Bilateral 07/12/2020   Procedure: HOLMIUM LASER APPLICATION;  Surgeon: Crist Fat, MD;  [facility]: [facility];  Service: Urology;  Laterality: Bilateral;   LAPAROTOMY N/A 05/07/2016   Procedure: EXPLORATORY LAPAROTOMY, LYLSIS OF ADHSIONS, Pam Drown OF PERITONEAL ABSCESS;  Surgeon: Romie Levee, MD;  [facility]: [facility];  Service: General;  Laterality: N/A;   LEFT HEART CATH AND CORONARY ANGIOGRAPHY N/A 05/30/2018   Procedure: LEFT HEART CATH AND CORONARY ANGIOGRAPHY;  Surgeon: Runell Gess, MD;  [facility];  Service: Cardiovascular;  Laterality: N/A;   LEFT HEART CATH AND CORONARY ANGIOGRAPHY N/A 11/17/2018   Procedure: LEFT HEART CATH AND CORONARY ANGIOGRAPHY;  Surgeon: Runell Gess, MD;  [facility];  Service: Cardiovascular;  Laterality: N/A;   PARATHYROIDECTOMY     one lobe- benign per pt   TEE WITHOUT CARDIOVERSION N/A 11/21/2018   Procedure: TRANSESOPHAGEAL ECHOCARDIOGRAM (TEE);  Surgeon: Alleen Borne, MD;  Location: [facility] OR;  Service: Open Heart Surgery;  Laterality: N/A;   TONSILLECTOMY     TOTAL HIP ARTHROPLASTY  07/08/2011   Procedure: TOTAL HIP ARTHROPLASTY;  Surgeon: Gus Rankin Aluisio;  [facility];  Service: Orthopedics;  Laterality: Right;   TOTAL KNEE ARTHROPLASTY Left 02/16/2022   Procedure: TOTAL KNEE ARTHROPLASTY;  Surgeon: Ollen Gross, MD;  [facility];  Service: Orthopedics;  Laterality: Left;   Family History  Problem Relation Age of Onset   CAD Father 13       PCI   Liver disease Neg Hx    Esophageal cancer Neg Hx    Colon cancer Neg Hx    Social History   Socioeconomic History   Marital status: Widowed    Spouse name: Not on file   Number of children: 2   Years of education: Not on file   Highest education level: Not on file  Occupational History   Occupation: retired  Tobacco Use   Smoking status: Never   Smokeless tobacco: Never  Vaping Use   Vaping status: Never Used  Substance and Sexual Activity   Alcohol use: Yes    Comment: socially- 2 x year   Drug use: No   Sexual activity: Not on file    Comment: Hysterectomy  Other Topics Concern   Not on file  Social History Narrative   Not on file   Social Drivers of Health   Financial Resource Strain: Low Risk  (10/22/2022)   [facility]   Overall Financial Resource Strain (CARDIA)    Difficulty of Paying Living Expenses: Not hard at all  Food Insecurity: No Food Insecurity (11/11/2023)   Hunger Vital Sign    Worried About Running Out of Food in the Last Year: Never true    Ran Out of Food in the Last Year: Never true  Transportation Needs: No Transportation Needs (11/11/2023)   PRAPARE - Administrator, Civil Service  (Medical): No    Lack of Transportation (Non-Medical): No  Physical Activity: Insufficiently Active (12/30/2021)   Received from [facility]   Exercise Vital Sign    Days of Exercise per Week: 1 day    Minutes of Exercise per Session: 30 min  Stress: Stress Concern Present (12/30/2021)   Received from [facility] - Occupational Stress Questionnaire  Feeling of Stress : To some extent  Social Connections: Unknown (01/03/2023)   Received [facility]   Social Network    Social Network: Not on file    Tobacco Counseling Counseling given: Not Answered   Clinical Intake:  Pre-visit preparation completed: Yes  Pain : No/denies pain     BMI - recorded: 32.88 Nutritional Status: BMI > 30  Obese Nutritional Risks: None Diabetes: Yes CBG done?: No Did pt. bring in CBG monitor from home?: No  How often do you need to have someone help you when you read instructions, pamphlets, or other written materials from your doctor or pharmacy?: 1 - Never What is the last grade level you completed in school?: graduate degree  Interpreter Needed?: No  Information entered by :: Cattleya Dobratz Nedra Hai NP   Activities of Daily Living    11/11/2023    2:46 PM 09/23/2023    2:36 PM  In your present state of health, do you have any difficulty performing the following activities:  Hearing? 0 0  Comment R ear drum ruptured in the past, better hearing over years.   Vision? 0 0  Difficulty concentrating or making decisions? 0 0  Walking or climbing stairs? 0 0  Dressing or bathing? 0 0  Doing errands, shopping? 0 0  Preparing Food and eating ? N   Using the Toilet? N   In the past six months, have you accidently leaked urine? N   Do you have problems with loss of bowel control? N   Managing your Medications? N   Managing your Finances? N   Housekeeping or managing your Housekeeping? N     Patient Care  Team: Yoselin Amerman X, NP as PCP - General (Internal Medicine) Runell Gess, MD as PCP - Cardiology (Cardiology) Jill Side, OD as Referring Physician  Indicate any recent Medical Services you may have received from other than [facility] providers in the past year (date may be approximate).     Assessment:   This is a routine wellness examination for Monique [name].  Hearing/Vision screen Hearing Screening - Comments:: No hearing issues. Vision Screening - Comments:: Eye exam October 2024 Dr.Barts no issues   Goals Addressed             This Visit's Progress    Maintain Mobility and Function       Evidence-based guidance:  Acknowledge and validate impact of pain, loss of strength and potential disfigurement (hand osteoarthritis) on mental health and daily life, such as social isolation, anxiety, depression, impaired sexual relationship and   injury from falls.  Anticipate referral to physical or occupational therapy for assessment, therapeutic exercise and recommendation for adaptive equipment or assistive devices; encourage participation.  Assess impact on ability to perform activities of daily living, as well as engage in sports and leisure events or requirements of work or school.  Provide anticipatory guidance and reassurance about the benefit of exercise to maintain function; acknowledge and normalize fear that exercise may worsen symptoms.  Encourage regular exercise, at least 10 minutes at a time for 45 minutes per week; consider yoga, water exercise and proprioceptive exercises; encourage use of wearable activity tracker to increase motivation and adherence.  Encourage maintenance or resumption of daily activities, including employment, as pain allows and with minimal exposure to trauma.  Assist patient to advocate for adaptations to the work environment.  Consider level of pain and function, gender, age, lifestyle, patient preference, quality of life, readiness and ?ocapacity  to  benefit? when recommending patients for orthopaedic surgery consultation.  Explore strategies, such as changes to medication regimen or activity that enables patient to anticipate and manage flare-ups that increase deconditioning and disability.  Explore patient preferences; encourage exposure to a broader range of activities that have been avoided for fear of experiencing pain.  Identify barriers to participation in therapy or exercise, such as pain with activity, anticipated or imagined pain.  Monitor postoperative joint replacement or any preexisting joint replacement for ongoing pain and loss of function; provide social support and encouragement throughout recovery.   Notes:        Depression Screen    11/11/2023    2:55 PM 11/11/2023    2:20 PM 08/12/2023    3:37 PM 06/17/2023    3:31 PM 04/23/2023   11:02 AM 04/08/2023    2:59 PM 07/22/2018    3:29 PM  PHQ 2/9 Scores  PHQ - 2 Score 0 0 0 0 4 2 4   PHQ- 9 Score    0 14 4 10     Fall Risk    11/11/2023    2:19 PM 09/10/2023   11:00 AM 09/03/2023    2:57 PM 08/12/2023    3:37 PM 08/03/2023   10:51 AM  Fall Risk   Falls in the past year? 0 0 0 0 0  Number falls in past yr: 0 0 0 0 0  Injury with Fall? 0 0 0 0 0  Risk for fall due to : No Fall Risks No Fall Risks No Fall Risks  No Fall Risks  Follow up Falls evaluation completed Falls evaluation completed;Education provided;Falls prevention discussed Falls evaluation completed  Falls evaluation completed;Education provided;Falls prevention discussed    MEDICARE RISK AT HOME: Medicare Risk at Home Any stairs in or around the home?: Yes If so, are there any without handrails?: No Home free of loose throw rugs in walkways, pet beds, electrical cords, etc?: Yes Adequate lighting in your home to reduce risk of falls?: Yes Life alert?: No Use of a cane, walker or w/c?: No Grab bars in the bathroom?: Yes Shower chair or bench in shower?: Yes Elevated toilet seat or a handicapped  toilet?: Yes  TIMED UP AND GO:  Was the test performed?  No    Cognitive Function:        11/11/2023    2:23 PM  6CIT Screen  What Year? 0 points  What month? 0 points  What time? 0 points  Count back from 20 0 points  Months in reverse 0 points  Repeat phrase 4 points  Total Score 4 points    Immunizations Immunization History  Administered Date(s) Administered   Influenza-Unspecified 05/03/2023   PFIZER(Purple Top)SARS-COV-2 Vaccination 08/22/2019, 09/11/2019, 09/29/2019, 04/29/2020, 05/13/2020   PNEUMOCOCCAL CONJUGATE-20 12/29/2022   Pfizer Covid-19 Vaccine Bivalent Booster 22yrs & up 07/09/2021, 05/03/2023   Pneumococcal Conjugate PCV 7 08/03/2004   Pneumococcal Polysaccharide-23 10/21/2017, 05/31/2018, 08/11/2018, 09/19/2019, 04/29/2020, 12/04/2020, 09/17/2021   RSV,unspecified 05/03/2023   Tdap 02/21/2014   Unspecified SARS-COV-2 Vaccination 09/08/2019, 09/29/2019    TDAP status: Up to date  Flu Vaccine status: Up to date  Pneumococcal vaccine status: Up to date  Covid-19 vaccine status: Completed vaccines  Qualifies for Shingles Vaccine? Yes   Zostavax completed Yes   Shingrix Completed?: No.    Education has been provided regarding the importance of this vaccine. Patient has been advised to call insurance company to determine out of pocket expense if they have not yet received  this vaccine. Advised may also receive vaccine at local pharmacy or Health Dept. Verbalized acceptance and understanding.  Screening Tests Health Maintenance  Topic Date Due   Zoster Vaccines- Shingrix (1 of 2) Never done   COVID-19 Vaccine (10 - 2024-25 season) 05/03/2024 (Originally 06/28/2023)   HEMOGLOBIN A1C  02/07/2024   DTaP/Tdap/Td (2 - Td or Tdap) 02/22/2024   INFLUENZA VACCINE  03/03/2024   OPHTHALMOLOGY EXAM  05/03/2024   FOOT EXAM  06/16/2024   Diabetic kidney evaluation - Urine ACR  08/09/2024   Diabetic kidney evaluation - eGFR measurement  09/15/2024   Medicare  Annual Wellness (AWV)  11/10/2024   Pneumonia Vaccine 2+ Years old  Completed   DEXA SCAN  Completed   HPV VACCINES  Aged Out   Meningococcal B Vaccine  Aged Out    Health Maintenance  Health Maintenance Due  Topic Date Due   Zoster Vaccines- Shingrix (1 of 2) Never done    Colorectal cancer screening: No longer required.   Mammogram status: No longer required due to aged out.  Bone Density status: Completed 11/04/23. Results reflect: Bone density results: NORMAL. Repeat every 5 years.  Lung Cancer Screening: (Low Dose CT Chest recommended if Age 65-80 years, 20 pack-year currently smoking OR have quit w/in 15years.) does not qualify.   Lung Cancer Screening Referral: none  Additional Screening:  Hepatitis C Screening: does not qualify; Completed   Vision Screening: Recommended annual ophthalmology exams for early detection of glaucoma and other disorders of the eye. Is the patient up to date with their annual eye exam?  Yes  Who is the provider or what is the name of the office in which the patient attends annual eye exams? Dr. Cherlynn Polo If pt is not established with a provider, would they like to be referred to a provider to establish care? No .   Dental Screening: Recommended annual dental exams for proper oral hygiene  Diabetic Foot Exam: Diabetic Foot Exam: Completed 11/11/23  Community Resource Referral / Chronic Care Management: CRR required this visit?  No   CCM required this visit?  No     Plan:     I have personally reviewed and noted the following in the patient's chart:   Medical and social history Use of alcohol, tobacco or illicit drugs  Current medications and supplements including opioid prescriptions. Patient is not currently taking opioid prescriptions. Functional ability and status Nutritional status Physical activity Advanced directives List of other physicians Hospitalizations, surgeries, and ER visits in previous 12 months Vitals Screenings  to include cognitive, depression, and falls Referrals and appointments  In addition, I have reviewed and discussed with patient certain preventive protocols, quality metrics, and best practice recommendations. A written personalized care plan for preventive services as well as general preventive health recommendations were provided to patient.     Nalaysia Manganiello X Quantavius Humm, NP   11/11/2023   After Visit Summary: (In Person-Printed) AVS printed and given to the patient

## 2023-11-12 MED ORDER — MOUNJARO 2.5 MG/0.5ML ~~LOC~~ SOAJ: 2.5000 mg | SUBCUTANEOUS | 0 refills | Status: DC

## 2023-11-12 MED ORDER — MOUNJARO 5 MG/0.5ML ~~LOC~~ SOAJ: 5.0000 mg | SUBCUTANEOUS | 0 refills | Status: DC

## 2023-11-12 NOTE — Addendum Note (Signed)
 Addended by: Rosalee Kaufman on: 11/12/2023 09:13 AM   Modules accepted: Orders

## 2023-11-19 ENCOUNTER — Other Ambulatory Visit: Payer: Self-pay | Admitting: Nurse Practitioner

## 2023-11-19 DIAGNOSIS — I10 Essential (primary) hypertension: Secondary | ICD-10-CM

## 2023-12-08 ENCOUNTER — Ambulatory Visit (INDEPENDENT_AMBULATORY_CARE_PROVIDER_SITE_OTHER): Admitting: Otolaryngology

## 2023-12-08 ENCOUNTER — Telehealth (INDEPENDENT_AMBULATORY_CARE_PROVIDER_SITE_OTHER): Payer: Self-pay | Admitting: Otolaryngology

## 2023-12-08 ENCOUNTER — Encounter (INDEPENDENT_AMBULATORY_CARE_PROVIDER_SITE_OTHER): Payer: Self-pay

## 2023-12-08 VITALS — BP 146/69 | HR 86

## 2023-12-08 DIAGNOSIS — J310 Chronic rhinitis: Secondary | ICD-10-CM | POA: Diagnosis not present

## 2023-12-08 DIAGNOSIS — J343 Hypertrophy of nasal turbinates: Secondary | ICD-10-CM | POA: Diagnosis not present

## 2023-12-08 DIAGNOSIS — R0981 Nasal congestion: Secondary | ICD-10-CM | POA: Diagnosis not present

## 2023-12-08 DIAGNOSIS — R0982 Postnasal drip: Secondary | ICD-10-CM | POA: Diagnosis not present

## 2023-12-08 NOTE — Telephone Encounter (Signed)
 LVM to confirm appt & location [id number] afm

## 2023-12-11 NOTE — Progress Notes (Signed)
 Patient ID: Monique [name], female   DOB: [birth year], 83 y.o.   MRN: [id number]  Follow-up: Chronic postnasal drainage  HPI: The patient is AN 83 year old female who returns today for her follow-up evaluation.  She was last seen in January 2025.  At that time, she was complaining of chronic postnasal drainage.  She was noted to have nasal mucosal congestion and bilateral inferior turbinate hypertrophy.  She was placed on Atrovent nasal sprays.  The patient returns today reporting improvement in her nasal drainage.  Her nasal breathing has also improved.  However, she was recently diagnosed with pneumonia, and was treated with antibiotic.  Exam: General: Communicates without difficulty, well nourished, no acute distress. Head: Normocephalic, no evidence injury, no tenderness, facial buttresses intact without stepoff. Face/sinus: No tenderness to palpation and percussion. Facial movement is normal and symmetric. Eyes: PERRL, EOMI. No scleral icterus, conjunctivae clear. Neuro: CN II exam reveals vision grossly intact.  No nystagmus at any point of gaze. Ears: Auricles well formed without lesions.  Ear canals are intact without mass or lesion.  No erythema or edema is appreciated.  The TMs are intact without fluid. Nose: External evaluation reveals normal support and skin without lesions.  Dorsum is intact.  Anterior rhinoscopy reveals congested mucosa over anterior aspect of inferior turbinates and intact septum.  No purulence noted. Oral:  Oral cavity and oropharynx are intact, symmetric, without erythema or edema.  Mucosa is moist without lesions. Neck: Full range of motion without pain.  There is no significant lymphadenopathy.  No masses palpable.  Thyroid bed within normal limits to palpation.  Parotid glands and submandibular glands equal bilaterally without mass.  Trachea is midline. Neuro:  CN 2-12 grossly intact.   Assessment: 1.  Chronic rhinitis with nasal mucosal congestion and bilateral  inferior turbinate hypertrophy. 2.  Her chronic postnasal drainage has improved with the use of Atrovent. 3.  No acute infection is noted today.  Plan: 1.  The physical exam findings are reviewed with the patient. 2.  Continue the use of Atrovent as needed. 3.  Nasal saline irrigation is encouraged. 4.  The patient is encouraged to call with any questions or concerns.

## 2023-12-26 ENCOUNTER — Encounter: Payer: Self-pay | Admitting: Pharmacist Clinician (PhC)/ Clinical Pharmacy Specialist

## 2023-12-27 MED ORDER — CLOPIDOGREL BISULFATE 75 MG PO TABS: 75.0000 mg | ORAL_TABLET | Freq: Every day | ORAL | 0 refills | Status: DC

## 2023-12-28 ENCOUNTER — Ambulatory Visit: Payer: Medicare Other | Admitting: Physician Assistant

## 2023-12-28 MED ORDER — MOUNJARO 7.5 MG/0.5ML ~~LOC~~ SOAJ
7.5000 mg | SUBCUTANEOUS | 0 refills | Status: AC
Start: 2023-12-28 — End: ?

## 2024-01-04 ENCOUNTER — Telehealth: Payer: Self-pay | Admitting: Cardiovascular Disease

## 2024-01-04 ENCOUNTER — Other Ambulatory Visit: Payer: Self-pay | Admitting: Physician Assistant

## 2024-01-04 DIAGNOSIS — I2510 Atherosclerotic heart disease of native coronary artery without angina pectoris: Secondary | ICD-10-CM

## 2024-01-04 DIAGNOSIS — E039 Hypothyroidism, unspecified: Secondary | ICD-10-CM

## 2024-01-04 DIAGNOSIS — E119 Type 2 diabetes mellitus without complications: Secondary | ICD-10-CM

## 2024-01-04 NOTE — Telephone Encounter (Signed)
 Spoke with patient and shared response from Hao Meng, [place]:  TSH/Free T4, Hgb A1C and BMET order placed and released. No fasting needed for the blood work.  Hao    Informed patient she may come to our lab at [address], 1st floor or any [facility] location. Patient verbalized understanding and expressed appreciation for call.

## 2024-01-04 NOTE — Telephone Encounter (Signed)
 Patient states she has been on Mounjaro  for a couple months now and is asking for labs to see if anything has changed.  She would like to know if Beverely Buba would like any labs drawn prior to her visit with him on 01/20/24. If so she would also like her thyroid level checked. Informed patient this is typically checked/managed by PCP, but will forward message to Hao Meng, PA to see if he would like any lab prior to visit.  Will follow-up with response. Patient verbalized understanding.

## 2024-01-04 NOTE — Telephone Encounter (Signed)
 TSH/Free T4, Hgb A1C and BMET order placed and released. No fasting needed for the blood work.  Vestal Markin

## 2024-01-04 NOTE — Telephone Encounter (Signed)
 Patient is calling because she wanting to have labs completed before next visit with PA Ervin Heath. Patient requested to check the "normal" labs she would typically have completed and mentioned wanting to have her thyroids checked. Please advise.

## 2024-01-10 LAB — HEMOGLOBIN A1C
Est. average glucose Bld gHb Est-mCnc: 117 mg/dL
Hgb A1c MFr Bld: 5.7 % — ABNORMAL HIGH (ref 4.8–5.6)

## 2024-01-11 ENCOUNTER — Ambulatory Visit: Payer: Self-pay | Admitting: Physician Assistant

## 2024-01-11 LAB — BASIC METABOLIC PANEL WITH GFR
BUN/Creatinine Ratio: 31 — ABNORMAL HIGH (ref 12–28)
BUN: 31 mg/dL — ABNORMAL HIGH (ref 8–27)
CO2: 16 mmol/L — ABNORMAL LOW (ref 20–29)
Calcium: 10.4 mg/dL — ABNORMAL HIGH (ref 8.7–10.3)
Chloride: 101 mmol/L (ref 96–106)
Creatinine, Ser: 0.99 mg/dL (ref 0.57–1.00)
Glucose: 108 mg/dL — ABNORMAL HIGH (ref 70–99)
Potassium: 4.6 mmol/L (ref 3.5–5.2)
Sodium: 139 mmol/L (ref 134–144)
eGFR: 56 mL/min/{1.73_m2} — ABNORMAL LOW (ref >59)

## 2024-01-11 LAB — TSH+FREE T4
Free T4: 1.52 ng/dL (ref 0.82–1.77)
TSH: 2.21 u[IU]/mL (ref 0.450–4.500)

## 2024-01-17 ENCOUNTER — Other Ambulatory Visit: Payer: Self-pay | Admitting: Pharmacist Clinician (PhC)/ Clinical Pharmacy Specialist

## 2024-01-17 MED ORDER — MOUNJARO 10 MG/0.5ML ~~LOC~~ SOAJ: 10.0000 mg | SUBCUTANEOUS | 0 refills | Status: DC

## 2024-01-20 ENCOUNTER — Encounter: Payer: Self-pay | Admitting: Physician Assistant

## 2024-01-20 ENCOUNTER — Ambulatory Visit: Attending: Physician Assistant | Admitting: Physician Assistant

## 2024-01-20 VITALS — BP 106/72 | HR 93 | Ht 65.0 in | Wt 184.0 lb

## 2024-01-20 DIAGNOSIS — E785 Hyperlipidemia, unspecified: Secondary | ICD-10-CM | POA: Insufficient documentation

## 2024-01-20 DIAGNOSIS — I25810 Atherosclerosis of coronary artery bypass graft(s) without angina pectoris: Secondary | ICD-10-CM | POA: Diagnosis present

## 2024-01-20 DIAGNOSIS — I10 Essential (primary) hypertension: Secondary | ICD-10-CM | POA: Diagnosis present

## 2024-01-20 MED ORDER — ATORVASTATIN CALCIUM 80 MG PO TABS: 80.0000 mg | ORAL_TABLET | Freq: Every day | ORAL | 3 refills | Status: AC

## 2024-01-20 NOTE — Patient Instructions (Addendum)
 Medication Instructions:  RESTART ATORVASTATIN  80 MG DAILY *If you need a refill on your cardiac medications before your next appointment, please call your pharmacy*  Lab Work: FASTING LIPID PANEL AND LIVER FUNCTION TEST IN 3 MONTHS If you have labs (blood work) drawn today and your tests are completely normal, you will receive your results only by: MyChart Message (if you have MyChart) OR A paper copy in the mail If you have any lab test that is abnormal or we need to change your treatment, we will call you to review the results.  Testing/Procedures: NO TESTING  Follow-Up: At [facility], you and your health needs are our priority.  As part of our continuing mission to provide you with exceptional heart care, our providers are all part of one team.  This team includes your primary Cardiologist (physician) and Advanced Practice Providers or APPs (Physician Assistants and Nurse Practitioners) who all work together to provide you with the care you need, when you need it.  Your next appointment:   6 month(s)  Provider:   Lauro Portal, MD

## 2024-01-20 NOTE — Progress Notes (Signed)
 Cardiology Office Note   Date:  01/22/2024  ID:  [name], DOB 1940/09/06, MRN [id number] PCP: Mast, Man X, NP  [facility] Providers Cardiologist:  Dorn Lesches, MD     History of Present Illness Monique [name] is a 83 y.o. female with a hx of HTN, HLD, CAD s/p CABG and history of asymptomatic sinus tachycardia.  She has strong family history of CAD with her father died of MI at age 66.  She underwent diagnostic cardiac catheterization on 05/30/2018 which revealed high-grade calcified proximal LAD lesion treated with 2.5 x 20 mm Synergy DES, 80% small ramus lesion and 40% ostial OM1 were treated medically.  She was also anemic with hemoglobin of 8.1 at the time.  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  She eventually underwent CABG x3 by Dr. Lucas on 11/21/2018 with LIMA-LAD, SVG-OM1, and SVG-ramus.  Postop course was uncomplicated.   Patient was seen by Lamarr Satterfield, NP on 09/09/2023 at which time she was being treated for cough and congestion with steroid and the inhalers by allergist. She lives at a [facility] assisted living/retirement community. She was felt to be volume overloaded and was given 20 mg daily of Lasix  for 2 days along with potassium supplement. Repeat echocardiogram was performed on 09/10/2023 which showed EF 50 to 55%, no regional wall motion abnormality, grade 1 DD, RVSP 29.0 mmHg, mild MR. Was on her last in February, her symptom of shortness of breath has significantly improved.  She was interested in weight loss medication and was seen by our clinical pharmacist in April and started on Mounjaro .  Patient presents today for follow-up.  Overall she will feel great.  She has managed to lose 15 pounds on Mounjaro .  She denies any chest pain or shortness of breath.  She did have some cough in the past few days, and has mild crackles  in the right base of the lung.  She does not look volume overloaded therefore suspicion for heart failure is fairly low.  She is aware to see her PCP if cough worsens.  She has not been taking her atorvastatin , with her history of bypass surgery, I urged her to restart atorvastatin  80 mg daily.  She will need a fasting lipid panel and LFT in 3 months.  She can follow-up with Dr. Lesches in 6 months.   ROS:   She denies chest pain, palpitations, dyspnea, pnd, orthopnea, n, v, dizziness, syncope, edema, weight gain, or early satiety. All other systems reviewed and are otherwise negative except as noted above.    Studies Reviewed      Cardiac Studies & Procedures   ______________________________________________________________________________________________ CARDIAC CATHETERIZATION  CARDIAC CATHETERIZATION 11/17/2018  Conclusion Images from the original result were not included.   Ost LAD to Prox LAD lesion is 95% stenosed.  Lat Ramus lesion is 90% stenosed.  Ramus lesion is 90% stenosed.  Monique [name] is a 83 y.o. female   [id number] LOCATION:  FACILITY: [facility] PHYSICIAN: Dorn Lesches, M.D. 14-Dec-1940   DATE OF PROCEDURE:  11/17/2018  DATE OF DISCHARGE:     CARDIAC CATHETERIZATION    History obtained from chart review.Monique [name] is a 83y.o.  moderately overweight widowed Caucasian female mother of 2, grandmother and 2 grandchildren's husband Elza was a long-term patient of mine who died Aug 12, 2016. 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 She was referred by Verneita Piety nurse  practitioner for a symptomatic sinus tachycardia.  I last saw her in the office  07/13/2018.SABRA Her cardiac risk factors include treated hypertension and hyperlipidemia patient is a family history of heart disease with father who died of a myocardial function and age 64. She has never had a heart attack or stroke. She denies chest pain or shortness of breath. She does exercise  and does yoga as well. She has reactive airways disease. She is noted to be tachycardic by her PCP was referred here for further evaluation.  When I saw her in the office 05/27/2018 she was complaining of increasing dyspnea and exertional chest pain.  I performed radial diagnostic cath 05/22/2018 revealing high-grade calcified proximal LAD stenosis.  I stented her with a 2.5 mm x 20 mm long Synergy drug-eluting stent however there was a small area that would not completely expand which left her with a fairly focal 50% stenosis within the dilated segment.  She also had an 80% small ramus branch stenosis which was untreated.  Her angina has resolved her dyspnea has improved.  She was significantly anemic with a hemoglobin of 8.1 during her hospitalization which is improved with iron repletion up to the low 9 range.  She had done well until earlier this month when she started developing crescendo angina.  She was seen by Dr. Sydelle on 11/04/2018 who adjusted her medications and increased her long-acting oral nitrate.  She continues to have effort angina which is fairly reproducible as well as some unstable symptoms at night.  I suspect her proximal LAD stent has restenosed and that her best option would be CABG.  She is on aspirin  and Plavix .  I am arranging for her to undergo diagnostic coronary angiography by myself this coming Thursday.  Impression Ms. 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  In addition, she has high-grade disease in a ramus branch and a high first marginal branch.  Her dominant right coronary artery is free of disease and she had normal LV function at the time of her last procedure.  She has had accelerated angina.  At this point, I believe the best option is CABG.  She has been on aspirin  Plavix .  She will need Plavix  washout in the hospital since she has had accelerated  symptoms.  The sheath was removed and a TR band was placed on the right wrist to achieve patent hemostasis.  The patient left lab in stable condition.  I have notified her daughter of Ms. Quirk status (Terrill Arloa).  Dorn Lesches. MD, [facility] 11/17/2018 8:26 AM  Findings Coronary Findings Diagnostic  Dominance: Right  Left Anterior Descending Ost LAD to Prox LAD lesion is 95% stenosed. The lesion is calcified. The lesion was previously treated using a drug eluting stent between 6-12 months ago. Previously placed stent displays restenosis.  Ramus Intermedius Ramus lesion is 90% stenosed.  Lateral Ramus Intermedius Lat Ramus lesion is 90% stenosed.  Intervention  No interventions have been documented.   CARDIAC CATHETERIZATION  CARDIAC CATHETERIZATION 05/30/2018  Conclusion Images from the original result were not included.   Ost 1st Mrg to 1st Mrg lesion is 40% stenosed.  Ramus lesion is 80% stenosed.  Ost LAD to Prox LAD lesion is 90% stenosed.  A stent was successfully placed.  Post intervention, there is a 50% residual stenosis.  The left ventricular systolic function is normal.  LV end diastolic pressure is normal.  The left ventricular ejection fraction is 55-65% by visual estimate.  Monique [name] is a 83 y.o. female   [id number] LOCATION:  FACILITY: [facility] PHYSICIAN: Dorn Lesches, M.D. February 08, 1941   DATE OF PROCEDURE:  05/30/2018  DATE OF DISCHARGE:     CARDIAC CATHETERIZATION / PCI DES LAD    History obtained from chart review.  Monique [name] is a 83 y.o.  moderately overweight widowed Caucasian female mother of 2, grandmother and 2 grandchildren's husband Elza was a long-term patient of mine who died 03-Sep-2016. 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 She was referred by Verneita Piety nurse practitioner for a symptomatic sinus tachycardia.  I last saw her in the office 08/20/2017 . Her cardiac risk factors include  treated hypertension and hyperlipidemia patient is a family history of heart disease with father who died of a myocardial function and age 15. She has never had a heart attack or stroke. She denies chest pain or shortness of breath. She does exercise and does yoga as well. She has reactive airways disease. She is noted to be tachycardic by her PCP was referred here for further evaluation.  Since I saw her back in January, over the last 6 months she is developed progressive exertional angina and dyspnea on exertion with left upper extremity radiation which is fairly reproducible.  She presents today for outpatient radial diagnostic cardiac catheterization and potential intervention.   PROCEDURE DESCRIPTION:  The patient was brought to the second floor [facility] cath lab in the postabsorptive state.  She was premedicated with IV Versed  and fentanyl .  Her right wristwas prepped and shaved in usual sterile fashion. Xylocaine  1% was used for local anesthesia. A 6 French sheath was inserted into the radial artery using standard Seldinger technique. The patient received 4500 units  of heparin  intravenously.  A 5 [place] TIG catheter and pigtail catheters were used for selective coronary angiography and left ventriculography respectively.  Isovue  dye was used for the entirety of the case.  Retrograde aorta, left ventricular and pullback pressures were recorded.  Radial cocktail was administered via the SideArm sheath.  The patient received Angiomax  bolus followed by infusion with a therapeutic ACT.  She was already on aspirin  received Plavix  600 mg p.o. in addition to Pepcid  20 mg IV.  Using a 6 [place] XB LAD 3 cm curved guide catheter along with a 0.14 pro-water  guidewire and a 2 mm x 12 mm balloon the proximal LAD was predilated.  It was calcified fluoroscopically but I decided not to perform atherectomy.  She also had a ramus branch lesion that it was at most a 2 mm vessel that had 80% mid stenosis.  I  carefully positioned a 2.5 mm x 20 mill meter long Synergy drug-eluting stent across the diseased segment and deployed at 16 atm.  The distal two thirds of the stent deployed without difficulty however the proximal third was resistant to full expansion.  I then postdilated with a 2.5 x 12 mm long noncompliant balloon along the entirety of the vessel including the proximal vessel which still did not yield even at 20 atm and change to 2.58 mm balloon which likewise did not yield at 22 atm.  The patient did complain of chest pain with balloon inflation which resolved with balloon deflation.  Unfortunately, the residual stenosis was in the 50% range.  Impression PCI and drug-eluting stenting of a calcified segmental proximal LAD lesion with full expansion of the stent except in the proximal  25 to 30% with residual 50% stenosis.  She also has an 80% ramus branch stenosis and a proximally a 2 mm vessel which I did not intervene on either.  She has normal LV function.  I suspect her anginal symptoms will improve although she does have a suboptimal PCI result.  Angiomax  will continue for 4 hours full dose.  The sheath was removed and a TR band was placed on the right wrist to achieve patent hemostasis.  The patient left the lab in stable condition.  She will be gently hydrated, discharged home in the morning on aspirin  and Plavix .  Dorn Lesches. MD, [facility] 05/30/2018 11:40 AM      Recommend uninterrupted dual antiplatelet therapy with Aspirin  81mg  daily and Clopidogrel  75mg  daily for a minimum of 12 months (ACS - Class I recommendation).  Findings Coronary Findings Diagnostic  Dominance: Right  Left Anterior Descending Ost LAD to Prox LAD lesion is 90% stenosed. Vessel is the culprit lesion. The lesion is calcified.  Ramus Intermedius Ramus lesion is 80% stenosed. Vessel is not the culprit lesion. Relatively small vessel  Left Circumflex  First Obtuse Marginal Branch Ost 1st Mrg to 1st Mrg lesion  is 40% stenosed.  Intervention  Ost LAD to Prox LAD lesion Stent A stent was successfully placed. Post-Intervention Lesion Assessment The intervention was unsuccessful. Pre-interventional TIMI flow is 3. Post-intervention TIMI flow is 3. No complications occurred at this lesion. There is a 50% residual stenosis post intervention.     ECHOCARDIOGRAM  ECHOCARDIOGRAM COMPLETE 09/10/2023  Narrative ECHOCARDIOGRAM REPORT    Patient Name:   Monique [name] Date of Exam: 09/10/2023 Medical Rec #:  [id number]         Height:       65.0 in Accession #:    [id number]        Weight:       196.0 lb Date of Birth:  1941/04/27          BSA:          1.961 m Patient Age:    82 years          BP:           122/60 mmHg Patient Gender: F                 HR:           76 bpm. Exam Location:  [facility]  Procedure: 2D Echo, Cardiac Doppler and Color Doppler  Indications:    I10 Hypertension, left ventricle dysfunction  History:        Patient has prior history of Echocardiogram examinations, most recent 11/17/2018. CAD, Prior CABG; Risk Factors:Obesity, Diabetes, Hypertension, Dyslipidemia and Non-Smoker.  Sonographer:    Bascom Burows RCS, RVS Referring Phys: [id number] KATHRYN M LAWRENCE  IMPRESSIONS   1. Left ventricular ejection fraction, by estimation, is 50 to 55%. The left ventricle has low normal function. The left ventricle has no regional wall motion abnormalities. There is mild concentric left ventricular hypertrophy. Left ventricular diastolic parameters are consistent with Grade I diastolic dysfunction (impaired relaxation). 2. Right ventricular systolic function is normal. The right ventricular size is normal. There is normal pulmonary artery systolic pressure. The estimated right ventricular systolic pressure is 29.0 mmHg. 3. The mitral valve is grossly normal. Mild mitral valve regurgitation. 4. The aortic valve is tricuspid. Aortic valve regurgitation is not visualized. No aortic  stenosis is present. Aortic valve mean gradient measures 3.0 mmHg. 5. The inferior vena cava  is normal in size with greater than 50% respiratory variability, suggesting right atrial pressure of 3 mmHg.  Comparison(s): Prior images reviewed side by side. LVEF 50-55% range. Normal estimated RVSP. Mild mitral regurgitation.  FINDINGS Left Ventricle: Left ventricular ejection fraction, by estimation, is 50 to 55%. The left ventricle has low normal function. The left ventricle has no regional wall motion abnormalities. The left ventricular internal cavity size was normal in size. There is mild concentric left ventricular hypertrophy. Left ventricular diastolic parameters are consistent with Grade I diastolic dysfunction (impaired relaxation).  Right Ventricle: The right ventricular size is normal. No increase in right ventricular wall thickness. Right ventricular systolic function is normal. There is normal pulmonary artery systolic pressure. The tricuspid regurgitant velocity is 2.55 m/s, and with an assumed right atrial pressure of 3 mmHg, the estimated right ventricular systolic pressure is 29.0 mmHg.  Left Atrium: Left atrial size was normal in size.  Right Atrium: Right atrial size was normal in size.  Pericardium: Trivial pericardial effusion is present. The pericardial effusion is posterior to the left ventricle. Presence of epicardial fat layer.  Mitral Valve: The mitral valve is grossly normal. Mild mitral valve regurgitation. MV peak gradient, 2.8 mmHg. The mean mitral valve gradient is 1.0 mmHg.  Tricuspid Valve: The tricuspid valve is grossly normal. Tricuspid valve regurgitation is mild.  Aortic Valve: The aortic valve is tricuspid. There is mild aortic valve annular calcification. Aortic valve regurgitation is not visualized. No aortic stenosis is present. Aortic valve mean gradient measures 3.0 mmHg. Aortic valve peak gradient measures 5.9 mmHg. Aortic valve area, by VTI measures  2.51 cm.  Pulmonic Valve: The pulmonic valve was grossly normal. Pulmonic valve regurgitation is trivial.  Aorta: The aortic root and ascending aorta are structurally normal, with no evidence of dilitation.  Venous: The inferior vena cava is normal in size with greater than 50% respiratory variability, suggesting right atrial pressure of 3 mmHg.  IAS/Shunts: No atrial level shunt detected by color flow Doppler.   LEFT VENTRICLE PLAX 2D LVIDd:         4.80 cm     Diastology LVIDs:         3.50 cm     LV e' medial:    5.77 cm/s LV PW:         1.10 cm     LV E/e' medial:  12.8 LV IVS:        1.10 cm     LV e' lateral:   6.53 cm/s LVOT diam:     2.00 cm     LV E/e' lateral: 11.3 LV SV:         59 LV SV Index:   30 LVOT Area:     3.14 cm  LV Volumes (MOD) LV vol d, MOD A2C: 47.7 ml LV vol s, MOD A2C: 21.6 ml LV SV MOD A2C:     26.1 ml  RIGHT VENTRICLE RV Basal diam:  3.20 cm RV Mid diam:    2.50 cm RV S prime:     10.00 cm/s TAPSE (M-mode): 1.7 cm  LEFT ATRIUM             Index        RIGHT ATRIUM           Index LA diam:        4.20 cm 2.14 cm/m   RA Area:     14.60 cm LA Vol (A2C):   51.8 ml 26.41 ml/m  RA  Volume:   32.30 ml  16.47 ml/m LA Vol (A4C):   46.1 ml 23.51 ml/m LA Biplane Vol: 48.6 ml 24.78 ml/m AORTIC VALVE                    PULMONIC VALVE AV Area (Vmax):    2.38 cm     PV Vmax:       0.79 m/s AV Area (Vmean):   2.30 cm     PV Peak grad:  2.5 mmHg AV Area (VTI):     2.51 cm AV Vmax:           121.00 cm/s AV Vmean:          86.100 cm/s AV VTI:            0.235 m AV Peak Grad:      5.9 mmHg AV Mean Grad:      3.0 mmHg LVOT Vmax:         91.70 cm/s LVOT Vmean:        63.000 cm/s LVOT VTI:          0.188 m LVOT/AV VTI ratio: 0.80  AORTA Ao Root diam: 2.90 cm Ao Asc diam:  2.80 cm  MITRAL VALVE               TRICUSPID VALVE MV Area (PHT): 2.82 cm    TR Peak grad:   26.0 mmHg MV Area VTI:   2.57 cm    TR Vmax:        255.00 cm/s MV Peak grad:   2.8 mmHg MV Mean grad:  1.0 mmHg    SHUNTS MV Vmax:       0.84 m/s    Systemic VTI:  0.19 m MV Vmean:      55.2 cm/s   Systemic Diam: 2.00 cm MV Decel Time: 269 msec MV E velocity: 73.70 cm/s MV A velocity: 87.30 cm/s MV E/A ratio:  0.84  Jayson Sierras MD Electronically signed by Jayson Sierras MD Signature Date/Time: 09/10/2023/1:00:53 PM    Final   TEE  ECHO INTRAOPERATIVE TEE 11/21/2018  Narrative *INTRAOPERATIVE TRANSESOPHAGEAL REPORT *    Patient Name:   Monique [name] Date of Exam: 11/21/2018 Medical Rec #:  [id number]         Height:       65.0 in Accession #:    [id number]        Weight:       182.7 lb Date of Birth:  [birth year]          BSA:          1.90 m Patient Age:    78 years          BP:           157/74 mmHg Patient Gender: F                 HR:           88 bpm. Exam Location:  Anesthesiology  Transesophogeal exam was perform intraoperatively during surgical procedure. Patient was closely monitored under general anesthesia during the entirety of examination.  Indications:     Coronary artery disease History:         Risk Factors: Hypertension and Dyslipidemia. Performing Phys: DORISE MARLA FELLERS Diagnosing Phys: Bernardino Mango MD  Complications: No known complications during this procedure. POST-OP IMPRESSIONS Overall, there were no significant changes from pre-bypass. - Left Ventricle: The left ventricle is unchanged from pre-bypass. - Right  Ventricle: The right ventricle appears unchanged from pre-bypass. - Aorta: The aorta appears unchanged from pre-bypass. - Left Atrium: The left atrium appears unchanged from pre-bypass. - Left Atrial Appendage: The left atrial appendage appears unchanged from pre-bypass. - Aortic Valve: The aortic valve appears unchanged from pre-bypass. - Mitral Valve: There is mild regurgitation. - Tricuspid Valve: The tricuspid valve appears unchanged from pre-bypass. - Interatrial Septum: The interatrial septum appears  unchanged from pre-bypass. - Interventricular Septum: The interventricular septum appears unchanged from pre-bypass. - Pericardium: The pericardium appears unchanged from pre-bypass.  PRE-OP FINDINGS Left Ventricle: The left ventricle has hyperdynamic systolic function, with an ejection fraction of >65%. The cavity size was normal. There is no increase in left ventricular wall thickness. No evidence of left ventricular regional wall motion abnormalities. Right Ventricle: The right ventricle has normal systolic function. The cavity was normal. There is no increase in right ventricular wall thickness. Left Atrium: Left atrial size was dilated. The left atrial appendage is well visualized and there is no evidence of thrombus present. Right Atrium: Right atrial size was normal in size. Right atrial pressure is estimated at 10 mmHg. Interatrial Septum: No atrial level shunt detected by color flow Doppler. Pericardium: Trivial pericardial effusion is present. The pericardial effusion is LV-anterior. Mitral Valve: The mitral valve is normal in structure. Mitral valve regurgitation is trivial by color flow Doppler. The MR jet is centrally-directed. There is no evidence of mitral valve vegetation. Tricuspid Valve: The tricuspid valve was normal in structure. Tricuspid valve regurgitation is trivial by color flow Doppler. The jet is directed centrally. Aortic Valve: The aortic valve is normal in structure. Aortic valve regurgitation was not visualized by color flow Doppler. There is no stenosis of the aortic valve. There is no evidence of a vegetation on the aortic valve. Pulmonic Valve: The pulmonic valve was normal in structure. Pulmonic valve regurgitation is not visualized by color flow Doppler.  Aorta: The ascending aorta, aortic root and aortic arch are normal in size and structure.    Bernardino Mango MD Electronically signed by Bernardino Mango MD Signature Date/Time: 11/21/2018/5:26:55  PM    Final        ______________________________________________________________________________________________      Risk Assessment/Calculations          Physical Exam VS:  BP 106/72 (BP Location: Left Arm, Patient Position: Sitting, Cuff Size: Normal)   Pulse 93   Ht 5' 5 (1.651 m)   Wt 184 lb (83.5 kg)   SpO2 97%   BMI 30.62 kg/m    Wt Readings from Last 3 Encounters:  01/20/24 184 lb (83.5 kg)  11/11/23 197 lb 9.6 oz (89.6 kg)  11/08/23 195 lb 6.4 oz (88.6 kg)    GEN: Well nourished, well developed in no acute distress NECK: No JVD; No carotid bruits CARDIAC: RRR, no murmurs, rubs, gallops RESPIRATORY:  Clear to auscultation without rales, wheezing or rhonchi  ABDOMEN: Soft, non-tender, non-distended EXTREMITIES:  No edema; No deformity   ASSESSMENT AND PLAN  CAD s/p CABG: Denies any chest pain.  Continue carvedilol  and Plavix .  Patient managed to lose 15 pounds on Mounjaro .    Hypertension: Blood pressure is well-controlled  Hyperlipidemia: Restart atorvastatin , fasting lipid panel and LFT in 3 months.         Dispo: Follow-up with Dr. Wadie in 6 months.  Signed, Scot Ford, PA

## 2024-01-29 ENCOUNTER — Other Ambulatory Visit: Payer: Self-pay | Admitting: Nurse Practitioner

## 2024-01-31 ENCOUNTER — Telehealth: Payer: Self-pay

## 2024-01-31 ENCOUNTER — Other Ambulatory Visit: Payer: Self-pay | Admitting: *Deleted

## 2024-01-31 MED ORDER — PREGABALIN 25 MG PO CAPS: 25.0000 mg | ORAL_CAPSULE | Freq: Every day | ORAL | 5 refills | Status: DC

## 2024-01-31 NOTE — Telephone Encounter (Signed)
 Good Morning Monique [name],  We have received a surgical clearance request for Monique [name] for lumbar ESI. They were seen recently in clinic on 01/20/2024. Can you please comment on surgical clearance for her upcoming procedure. Please forward you guidance and recommendations to P [facility] PREOP   Thank you, Jackee Alberts, NP

## 2024-01-31 NOTE — Telephone Encounter (Signed)
...     Pre-operative Risk Assessment    Patient Name: Monique [name]  DOB: 01-18-1941 MRN: [id number]   Date of last office visit: 01/20/24 Date of next office visit: NONE   Request for Surgical Clearance    Procedure:  LUMBAR TRANSFORAMINAL EPIDURAL STEROID  Date of Surgery:  Clearance 02/16/24                                Surgeon:  MELODIE Surgeon's Group or Practice Name:  [facility] Phone number:  [phone] Fax number:  [phone]   Type of Clearance Requested:   - Medical  PHARM PLAVIX  HOLD FOR 5 DAYS   Type of Anesthesia:  Not Indicated   Additional requests/questions:    [name]   01/31/2024, 11:16 AM

## 2024-01-31 NOTE — Telephone Encounter (Signed)
 Pharmacy requested refill.  Pended Rx and sent to [facility] for approval.

## 2024-02-10 ENCOUNTER — Telehealth: Payer: Self-pay

## 2024-02-10 NOTE — Telephone Encounter (Signed)
 Office calling requesting update on preop clearance requested on 01/31/24 for an upcoming procedure on 02/16/24. Please advise. Thank You

## 2024-02-11 NOTE — Telephone Encounter (Signed)
   Patient Name: Monique [name]  DOB: [birth year] MRN: [id number]  Primary Cardiologist: Dorn Lesches, MD  Chart reviewed as part of pre-operative protocol coverage. Given past medical history and time since last visit, based on ACC/AHA guidelines, Monique [name] is at acceptable risk for the planned procedure without further cardiovascular testing.   Patient may hold plavix  for 5-7 days prior to the procedure and restart as soon as possible afterward at the surgeon's discretion.   The patient was advised that if she develops new symptoms prior to surgery to contact our office to arrange for a follow-up visit, and she verbalized understanding.  I will route this recommendation to the requesting party via Epic fax function and remove from pre-op pool.  Please call with questions.  Myangel Summons, [place] 02/11/2024, 8:01 AM

## 2024-03-02 ENCOUNTER — Encounter: Payer: Self-pay | Admitting: Pharmacist Clinician (PhC)/ Clinical Pharmacy Specialist

## 2024-03-02 MED ORDER — MOUNJARO 12.5 MG/0.5ML ~~LOC~~ SOAJ: 12.5000 mg | SUBCUTANEOUS | 0 refills | Status: DC

## 2024-03-10 ENCOUNTER — Telehealth: Payer: Self-pay

## 2024-03-10 NOTE — Telephone Encounter (Signed)
   Pre-operative Risk Assessment    Patient Name: Monique [name]  DOB: 1940-11-16 MRN: [id number]   Date of last office visit: 01/20/24 HAO MENG, PA Date of next office visit: NONE   Request for Surgical Clearance    Procedure:  EPIDURAL STEROID INJECTION, LUMBAR INTERIAMINAR (PROC) -INTRALAMINAR L5-S1  Date of Surgery:  Clearance TBD                                Surgeon:  DR LAQUETA Surgeon's Group or Practice Name:  [facility] Phone number:  [phone] Fax number:  [phone]   Type of Clearance Requested:   - Medical  - Pharmacy:  Hold Clopidogrel  (Plavix ) 7 DAY PRIOR   Type of Anesthesia:  Not Indicated   Additional requests/questions:    Signed, Lucie DELENA Ku   03/10/2024, 4:57 PM

## 2024-03-13 NOTE — Telephone Encounter (Signed)
   Patient Name: Monique [name]  DOB: August 18, 1940 MRN: [id number]  Primary Cardiologist: Dorn Lesches, MD  Patient was recently seen in clinic by Scot Ford, PA, per Hao's recent notes in 02/2024:  Chart reviewed as part of pre-operative protocol coverage. Given past medical history and time since last visit, based on ACC/AHA guidelines, Monique [name] is at acceptable risk for the planned procedure without further cardiovascular testing.    Patient may hold plavix  for 5-7 days prior to the procedure and restart as soon as possible afterward at the surgeon's discretion.    I will route this recommendation to the requesting party via Epic fax function and remove from pre-op pool.   Please call with questions.  Marcelo Ickes D Pricsilla Lindvall, NP 03/13/2024, 8:26 AM

## 2024-03-22 ENCOUNTER — Other Ambulatory Visit: Payer: Self-pay | Admitting: Nurse Practitioner

## 2024-03-22 DIAGNOSIS — I10 Essential (primary) hypertension: Secondary | ICD-10-CM

## 2024-03-25 ENCOUNTER — Emergency Department (HOSPITAL_BASED_OUTPATIENT_CLINIC_OR_DEPARTMENT_OTHER): Admitting: Radiology

## 2024-03-25 ENCOUNTER — Other Ambulatory Visit: Payer: Self-pay

## 2024-03-25 ENCOUNTER — Emergency Department (HOSPITAL_BASED_OUTPATIENT_CLINIC_OR_DEPARTMENT_OTHER)

## 2024-03-25 ENCOUNTER — Emergency Department (HOSPITAL_BASED_OUTPATIENT_CLINIC_OR_DEPARTMENT_OTHER)
Admission: EM | Admit: 2024-03-25 | Discharge: 2024-03-25 | Disposition: A | Discharge status: Home / Self Care | Attending: Emergency Medicine | Admitting: Emergency Medicine

## 2024-03-25 DIAGNOSIS — S4292XA Fracture of left shoulder girdle, part unspecified, initial encounter for closed fracture: Secondary | ICD-10-CM | POA: Diagnosis not present

## 2024-03-25 DIAGNOSIS — M25512 Pain in left shoulder: Secondary | ICD-10-CM | POA: Diagnosis present

## 2024-03-25 DIAGNOSIS — Z7901 Long term (current) use of anticoagulants: Secondary | ICD-10-CM | POA: Diagnosis not present

## 2024-03-25 DIAGNOSIS — W010XXA Fall on same level from slipping, tripping and stumbling without subsequent striking against object, initial encounter: Secondary | ICD-10-CM | POA: Diagnosis not present

## 2024-03-25 MED ORDER — IBUPROFEN 800 MG PO TABS
800.0000 mg | ORAL_TABLET | Freq: Once | ORAL | Status: AC
  Administered 2024-03-25: 800 mg via ORAL
  Filled 2024-03-25: qty 1

## 2024-03-25 MED ORDER — ACETAMINOPHEN 500 MG PO TABS
1000.0000 mg | ORAL_TABLET | Freq: Once | ORAL | Status: DC
Start: 2024-03-25 — End: 2024-03-25

## 2024-03-25 NOTE — Discharge Instructions (Addendum)
 ### Humeral Fracture Care Guide     **Patient Information: Left Humeral Head Fracture Treated with Sling Immobilization**      You have a fracture of the upper part of your left arm bone (humeral head). This injury is common in older adults and is usually treated without surgery. The main goals are to help the bone heal, manage pain, and restore movement and function over time.      **Treatment Plan**      - **Immobilization:** Your arm will be supported in a sling. This helps keep the bone in place and allows it to heal. Most patients wear the sling for about 3 weeks, but your doctor will tell you exactly how long based on your progress and comfort.[1][2][3]      - **Supportive Care:**       - Use ice packs (wrapped in a towel) for 15-20 minutes at a time to reduce pain and swelling.      - Take pain medications as prescribed. Acetaminophen  or other pain relievers may be recommended.      - Keep your hand, wrist, and fingers moving to prevent stiffness.      - Watch for signs of swelling, numbness, or tingling in your hand or arm, and report these to your doctor.      **Rehabilitation and Recovery**      - **Early Movement:** After the initial immobilization period, gentle exercises will begin. These may include pendulum movements and assisted exercises to help restore shoulder motion. Your doctor or physical therapist will guide you on when and how to start these safely.[1][2][3]      - **Active Exercises:** Around 6 weeks after injury, you may start more active exercises to strengthen your shoulder and improve function. Recovery is gradual, and full healing can take several months. Most patients regain good use of their arm, but some stiffness or mild discomfort may persist for a while.[1]      - **Adherence:** Following the recommended rehabilitation program is important for the best outcome. However, studies show that even if some patients have difficulty with exercises or wearing the  sling, most still do well in the long term.[4]      **Follow-Up Care**      - **Orthopedic Visits:** Regular check-ups with your orthopedic doctor are important. These visits allow your doctor to monitor healing with physical exams and sometimes X-rays, adjust your treatment plan, and address any concerns.      - **Physical Therapy:** You may be referred to a physical therapist for supervised exercises and advice on home exercises. Good quality home-based exercises can help maintain independence and function.[5]      **Expected Outcomes**      - Most elderly                                    ### References  1. Minimally Displaced Fractures of the Greater Tuberosity: Outcome of Non-Operative Treatment. Dominique FORBES Kurt LOISE Beryl MALVA, et al. Journal of Shoulder and Elbow Surgery. 2013;22(10):e8-e11. doi:10.1016/j.jse.2013.01.033. 2. Surgical vs Nonsurgical Treatment of Adults With Displaced Fractures of the Proximal Humerus: The PROFHER Randomized Clinical Trial. Rangan A, Handoll H, Brealey S, et al. JAMA. 2015;313(10):1037-47. doi:10.1001/jama.7984.8370. 3. Acute Proximal Humeral Fractures in Adults. Madelaine GAILS, [place], Desmeules F, [place], [place] DM. Journal of Hand Therapy : Official Journal of the American Society of Warden/ranger. 2017 Apr - Jun;30(2):158-166. doi:10.1016/j.jht.2017.05.005. 4.  The Influence of Adherence to Orthosis and Physiotherapy Protocol on Functional Outcome After Proximal Humeral Fracture in the Elderly. Fleischhacker E, Gleich J, Smolka V, et al. Journal of Clinical Medicine. 2023;12(5):1762. doi:10.3390/jcm12051762. 5. Does an Early Mobilization and Immediate Home-Based Self-Therapy Exercise Program Displace Proximal Humeral Fractures in Conservative Treatment? Observational Study. Theola CRICK, Ario B, Moreno-Mateo F, et al. Journal of Shoulder and Elbow Surgery. 2018;27(11):2021-2029. doi:10.1016/j.jse.2018.04.001.

## 2024-03-25 NOTE — ED Provider Notes (Signed)
 [place] EMERGENCY DEPARTMENT AT [facility] Provider Note   CSN: [id number] Arrival date & time: 03/25/24  8367     Patient presents with: Monique [name] Thea is a 83 y.o. female.  {Add pertinent medical, surgical, social history, OB history to YEP:67052} Patient tripped and slammed her L shoulder into a wall. Immediate pain L shoulder. Feels like previous R shoulder fracture.  The history is provided by the patient.  Shoulder Injury This is a new problem. The current episode started less than 1 hour ago. The problem occurs constantly. The problem has not changed since onset.Pertinent negatives include no chest pain, no abdominal pain, no headaches and no shortness of breath. Exacerbated by: movement. The symptoms are relieved by position.       Prior to Admission medications   Medication Sig Start Date End Date Taking? Authorizing Provider  albuterol  (VENTOLIN  HFA) 108 (90 Base) MCG/ACT inhaler Inhale 2 puffs into the lungs every 6 (six) hours as needed for wheezing or shortness of breath. 08/03/23   Sherlynn Madden, MD  amLODipine  (NORVASC ) 5 MG tablet TAKE 1 TABLET BY MOUTH DAILY 03/22/24   Mast, Man X, NP  atorvastatin  (LIPITOR ) 80 MG tablet Take 1 tablet (80 mg total) by mouth daily. 01/20/24   Meng, Hao, PA  budesonide-formoterol  [facility]) 80-4.5 MCG/ACT inhaler Inhale 2 puffs into the lungs in the morning and at bedtime. Also uses as needed for emergency purposes 07/23/21   [provider]  carvedilol  (COREG ) 12.5 MG tablet TAKE 1 TABLET BY MOUTH 2 TIMES A DAY 03/25/23   Court Dorn PARAS, MD  cetirizine (ZYRTEC) 10 MG tablet Take 10 mg by mouth at bedtime.    [provider]  clopidogrel  (PLAVIX ) 75 MG tablet Take 1 tablet (75 mg total) by mouth daily. 12/27/23   Ladona Heinz, MD  famotidine  (PEPCID  AC) 10 MG tablet Take 10 mg by mouth daily as needed for heartburn or indigestion.    [provider]  furosemide  (LASIX ) 20 MG  tablet Take 1 tablet (20 mg total) by mouth as needed. 09/20/23 01/20/24  Meng, Hao, PA  ipratropium (ATROVENT) 0.03 % nasal spray Place 2 sprays into both nostrils 3 (three) times daily as needed for rhinitis. 09/17/21   [provider]  levothyroxine  (SYNTHROID ) 25 MCG tablet Take 1 tablet (25 mcg total) by mouth daily. 04/23/23   Sherlynn Madden, MD  lisinopril  (PRINIVIL ,ZESTRIL ) 40 MG tablet Take 40 mg by mouth daily.    [provider]  magic mouthwash (nystatin , diphenhydrAMINE , alum & mag hydroxide) suspension mixture Take 5 mLs by mouth 4 (four) times daily. Suspension contains equal amounts of Maalox Extra Strength, nystatin , and diphenhydramine . 09/24/23   Mast, Man X, NP  Multiple Vitamins-Minerals (MULTIVITAMIN WOMEN 50+ PO) Take 1 tablet by mouth daily.    [provider]  nitroGLYCERIN  (NITROSTAT ) 0.4 MG SL tablet Place 1 tablet (0.4 mg total) under the tongue as needed. 01/08/22   Janene Boer, PA  OVER THE COUNTER MEDICATION Apply 1 Application topically as needed (pain). Recruitment consultant, Historical, MD  pantoprazole  (PROTONIX ) 40 MG tablet Take 1 tablet (40 mg total) by mouth 2 (two) times daily before a meal. 05/18/16   Simaan, Almarie RAMAN, PA-C  potassium chloride  SA (KLOR-CON  M) 20 MEQ tablet Take 1 tablet (20 mEq total) by mouth as needed. 09/20/23   Meng, Hao, PA  pregabalin  (LYRICA ) 25 MG capsule Take 1 capsule (25 mg total) by mouth daily.  01/31/24   Mast, Man X, NP  Simethicone  (PHAZYME ULTIMATE) 500 MG CAPS Take 500 mg by mouth daily as needed (bloating).    [provider]  Soft Lens Products (BIOTRUE) SOLN Place 1 drop into both eyes as needed (dry eyes).    [provider]  sucralfate  (CARAFATE ) 1 g tablet Take your Protonix  at least 30 minutes prior to your first morning dose of Carafate . 02/12/22   Molpus, Norleen, MD  tirzepatide  (MOUNJARO ) 12.5 MG/0.5ML Pen Inject 12.5 mg into the skin once a week. 03/02/24   Court Dorn PARAS, MD    Allergies: Morphine , Penicillin g, Clindamycin /lincomycin, Penicillins, and Sulfamethoxazole-trimethoprim    Review of Systems  Respiratory:  Negative for shortness of breath.   Cardiovascular:  Negative for chest pain.  Gastrointestinal:  Negative for abdominal pain.  Neurological:  Negative for headaches.    Updated Vital Signs BP (!) 144/76 (BP Location: Right Arm)   Pulse 95   Temp 98.1 F (36.7 C)   Resp 16   Ht 5' 5 (1.651 m)   Wt 76.7 kg   SpO2 95%   BMI 28.12 kg/m   Physical Exam  (all labs ordered are listed, but only abnormal results are displayed) Labs Reviewed - No data to display  EKG: None  Radiology: No results found.  {Document cardiac monitor, telemetry assessment procedure when appropriate:32947} Procedures   Medications Ordered in the ED - No data to display    {Click here for ABCD2, HEART and other calculators REFRESH Note before signing:1}                              Medical Decision Making Amount and/or Complexity of Data Reviewed Radiology: ordered.  Risk Prescription drug management.   ***  {Document critical care time when appropriate  Document review of labs and clinical decision tools ie CHADS2VASC2, etc  Document your independent review of radiology images and any outside records  Document your discussion with family members, caretakers and with consultants  Document social determinants of health affecting pt's care  Document your decision making why or why not admission, treatments were needed:32947:::1}   Final diagnoses:  None    ED Discharge Orders     None

## 2024-03-25 NOTE — ED Notes (Signed)
 ED Provider at bedside.

## 2024-03-25 NOTE — ED Triage Notes (Signed)
 Pt POV reporting L shoulder pain after mechanical fall, has neuropathy, tripped and hit L shoulder on wall. Hx R shoulder frx, states it feels like same.

## 2024-03-30 ENCOUNTER — Encounter (HOSPITAL_BASED_OUTPATIENT_CLINIC_OR_DEPARTMENT_OTHER): Payer: Self-pay | Admitting: Pharmacist Clinician (PhC)/ Clinical Pharmacy Specialist

## 2024-03-31 MED ORDER — MOUNJARO 15 MG/0.5ML ~~LOC~~ SOAJ: 15.0000 mg | SUBCUTANEOUS | 0 refills | Status: DC

## 2024-04-17 ENCOUNTER — Other Ambulatory Visit: Payer: Self-pay | Admitting: Sports Medicine

## 2024-04-17 DIAGNOSIS — E039 Hypothyroidism, unspecified: Secondary | ICD-10-CM

## 2024-04-22 ENCOUNTER — Encounter (HOSPITAL_BASED_OUTPATIENT_CLINIC_OR_DEPARTMENT_OTHER): Payer: Self-pay | Admitting: Pharmacist Clinician (PhC)/ Clinical Pharmacy Specialist

## 2024-04-26 ENCOUNTER — Other Ambulatory Visit: Payer: Self-pay | Admitting: Cardiovascular Disease

## 2024-04-26 MED ORDER — MOUNJARO 12.5 MG/0.5ML ~~LOC~~ SOAJ: 12.5000 mg | SUBCUTANEOUS | 0 refills | Status: DC

## 2024-05-02 ENCOUNTER — Other Ambulatory Visit: Payer: Self-pay

## 2024-05-02 MED ORDER — LISINOPRIL 40 MG PO TABS: 40.0000 mg | ORAL_TABLET | Freq: Every day | ORAL | 1 refills | Status: DC

## 2024-05-26 ENCOUNTER — Telehealth (HOSPITAL_BASED_OUTPATIENT_CLINIC_OR_DEPARTMENT_OTHER): Payer: Self-pay

## 2024-05-26 NOTE — Telephone Encounter (Signed)
 Patient has an upcoming appointment clearance can be addressed at ov and note had been made to appointment line that preop clearance is needed

## 2024-05-26 NOTE — Telephone Encounter (Signed)
   Name: Monique [name]  DOB: 04-30-1941  MRN: [id number]  Primary Cardiologist: Dorn Lesches, MD  Chart reviewed as part of pre-operative protocol coverage. The patient has an upcoming visit scheduled with Dr. Lesches on 07/11/2024 at which time clearance can be addressed in case there are any issues that would impact surgical recommendations. If surgery needs to be scheduled sooner, please have surgeon's office contact us  with a specific date.   L4-5 Lumbar Laminectomy w/sublaminar Decompression  Is not scheduled until TBD as below. I added preop FYI to appointment note so that provider is aware to address at time of outpatient visit.  Per office protocol the cardiology provider should forward their finalized clearance decision and recommendations regarding antiplatelet therapy to the requesting party below.    This message will also be routed to  Dr Lesches for input on holding Plavix  as requested below so that this information is available to the clearing provider at time of patient's appointment.   I will route this message as FYI to requesting party and remove this message from the preop box as separate preop APP input not needed at this time.   Please call with any questions.  Lamarr Satterfield, NP  05/26/2024, 10:24 AM

## 2024-05-26 NOTE — Telephone Encounter (Signed)
   Pre-operative Risk Assessment    Patient Name: Monique [name]  DOB: [birth year] MRN: [id number]   Date of last office visit: 01/20/2024 with Scot Ford, PA Date of next office visit: 07/11/24 with Dorn Lesches, MD   Request for Surgical Clearance    Procedure:  L4-5 Lumbar Laminectomy w/sublaminar Decompression  Date of Surgery:  Clearance TBD                                 Surgeon:  Alm CANDIE Molt Surgeon's Group or Practice Name:  [facility] Phone number:  [phone] ext:8244 Maryjane) Fax number:  [phone] - Attention: Shanda   Type of Clearance Requested:   - Medical  - Pharmacy:  Hold Clopidogrel  (Plavix ) - Does not specify   Type of Anesthesia:  General    Additional requests/questions:  N/A  SignedAyona, Yniguez   05/26/2024, 10:14 AM

## 2024-05-29 ENCOUNTER — Encounter (HOSPITAL_BASED_OUTPATIENT_CLINIC_OR_DEPARTMENT_OTHER): Payer: Self-pay | Admitting: Pharmacist Clinician (PhC)/ Clinical Pharmacy Specialist

## 2024-05-31 ENCOUNTER — Other Ambulatory Visit (HOSPITAL_COMMUNITY): Payer: Self-pay

## 2024-05-31 MED ORDER — MOUNJARO 15 MG/0.5ML ~~LOC~~ SOAJ: 15.0000 mg | SUBCUTANEOUS | 1 refills | Status: DC

## 2024-05-31 MED ORDER — ZEPBOUND 15 MG/0.5ML ~~LOC~~ SOAJ: 15.0000 mg | SUBCUTANEOUS | 1 refills | Status: DC

## 2024-05-31 NOTE — Addendum Note (Signed)
 Addended by: Roann Merk L on: 05/31/2024 12:31 PM   Modules accepted: Orders

## 2024-06-02 ENCOUNTER — Other Ambulatory Visit (HOSPITAL_BASED_OUTPATIENT_CLINIC_OR_DEPARTMENT_OTHER): Payer: Self-pay | Admitting: Cardiovascular Disease

## 2024-06-15 ENCOUNTER — Non-Acute Institutional Stay: Payer: Self-pay | Admitting: Nurse Practitioner

## 2024-06-15 ENCOUNTER — Encounter: Payer: Self-pay | Admitting: Nurse Practitioner

## 2024-06-15 VITALS — BP 126/78 | HR 84 | Temp 97.4°F | Resp 18 | Ht 65.0 in | Wt 163.2 lb

## 2024-06-15 DIAGNOSIS — M4800 Spinal stenosis, site unspecified: Secondary | ICD-10-CM

## 2024-06-15 DIAGNOSIS — G629 Polyneuropathy, unspecified: Secondary | ICD-10-CM | POA: Diagnosis not present

## 2024-06-15 DIAGNOSIS — Z9861 Coronary angioplasty status: Secondary | ICD-10-CM

## 2024-06-15 DIAGNOSIS — I2510 Atherosclerotic heart disease of native coronary artery without angina pectoris: Secondary | ICD-10-CM

## 2024-06-15 DIAGNOSIS — Z7985 Long-term (current) use of injectable non-insulin antidiabetic drugs: Secondary | ICD-10-CM

## 2024-06-15 DIAGNOSIS — K219 Gastro-esophageal reflux disease without esophagitis: Secondary | ICD-10-CM

## 2024-06-15 DIAGNOSIS — E785 Hyperlipidemia, unspecified: Secondary | ICD-10-CM

## 2024-06-15 DIAGNOSIS — I10 Essential (primary) hypertension: Secondary | ICD-10-CM | POA: Diagnosis not present

## 2024-06-15 DIAGNOSIS — E1159 Type 2 diabetes mellitus with other circulatory complications: Secondary | ICD-10-CM | POA: Diagnosis not present

## 2024-06-15 NOTE — Assessment & Plan Note (Signed)
 08/10/23 ACR 11, Hgb A1c 5.7 01/10/24 Diet controlled.

## 2024-06-15 NOTE — Progress Notes (Unsigned)
 Location:   [facility]   Place of Service:  Clinic (12) Provider: Larwance Detrick Dani NP  Shadawn Hanaway X, NP  Patient Care Team: Jmarion Christiano X, NP as PCP - General (Internal Medicine) Court Dorn PARAS, MD as PCP - Cardiology (Cardiology) Elspeth Lauraine DEL, OD as Referring Physician  Extended Emergency Contact Information Primary Emergency Contact: Vicci Glendia [place], [place] [address] [place] Home Phone: [phone] Mobile Phone: [phone] Relation: Son Secondary Emergency Contact: Arloa Belita PARAS [place], [place] [address] [place] Home Phone: [phone] Mobile Phone: [phone] Relation: Daughter  Code Status:  DNR Goals of care: Advanced Directive information    06/15/2024    3:07 PM  Advanced Directives  Does Patient Have a Medical Advance Directive? Yes  Type of Estate Agent of [place];Living will  Does patient want to make changes to medical advance directive? No - Patient declined  Copy of Healthcare Power of Attorney in Chart? Yes - validated most recent copy scanned in chart (See row information)     Chief Complaint  Patient presents with  . Medical Management of Chronic Issues    Routine Visit, needs to discuss eye exam    HPI:  Pt is a 83 y.o. female seen today for medical management of chronic diseases.    L shoulder fx 03/2024, healed.    Hypothyroidism, on Levothyroxine , TSH 4.35 08/10/23             02/12/22 CT abd Cholelithiasis with mild gallbladder wall thickening, LFT wnl 01/12/23. C/o RLQ pain resolves after BM, negative Murphy's sign, denied abd pain associated with nausea, vomiting, she is afebrile.              HTN, controlled, on  Amlodipine , Carvedilol , Lisinopril . Bun/creat 31/0.99 01/10/24             T2DM, 08/10/23 ACR 11, Hgb A1c 5.7 01/10/24, lost #40Ibs on Mounjaro              HLD, taking Atorvastatin , LDL 106 08/10/23             Neuropathy, off Gabapentin , placed on Lyrica  per  patient's request.              Hx of UTI, s/p kidney stones surgical removal.              OA, s/p L knee replacement, R hip, L shoulder pain-limited ROM, s/p Ortho, no further surgery, on Tramadol . Vit D 50 01/12/23             Insomnia, hx of              CAD s/p CABG x3 11/21/18, (prior stent drug eluting), hx of tachycardia, on oral nitrate, Plavix , Carvedilol , Lisinopril , followed by cardiology. TSH 2.2 01/10/24             GERD, on Famotidine , Pantoprazole , Sucralfate , Hgb 12.8 08/10/23             Asthma,  Symbicort, Atrovent nasal spray, dyspnea.                   Past Medical History:  Diagnosis Date  . Anemia     mild as per PCP  . Anginal pain    06/20/20, relieved   . Arthritis    hip/ s/p recent shoulder fracture 8/12- RIGHT  . Asthma   . Coronary artery disease  05/30/2018   LAD PCI/DES  . GI bleeding   . Hiatal hernia   . History of kidney stones    Last year had surgery  . Humerus fracture    with wrist on right side  . Hypertension    hypercholesterolemia/  EKG on chart with clearance and note 06/18/11  Mazzocchi  . Pneumonia   . Pre-diabetes    Past Surgical History:  Procedure Laterality Date  . ABDOMINAL HYSTERECTOMY    . COLECTOMY WITH COLOSTOMY CREATION/HARTMANN PROCEDURE N/A 05/07/2016   Procedure: COLOSTOMY CREATION/HARTMANN PROCEDURE;  Surgeon: Bernarda Ned, MD;  Location: [facility];  Service: General;  Laterality: N/A;  . COLONOSCOPY N/A 08/26/2016   Procedure: COLONOSCOPY;  Surgeon: Bernarda Ned, MD;  Location: [facility];  Service: Endoscopy;  Laterality: N/A;  . COLOSTOMY TAKEDOWN N/A 08/27/2016   Procedure: LAPAROSCOPIC COLOSTOMY REVERSAL;  Surgeon: Bernarda Ned, MD;  Location: [facility];  Service: General;  Laterality: N/A;  . CORONARY ARTERY BYPASS GRAFT N/A 11/21/2018   Procedure: CORONARY ARTERY BYPASS GRAFTING (CABG) x Three , using left internal mammary artery and right leg greater saphenous vein harvested endoscopically;  Surgeon: Lucas Dorise POUR,  MD;  Location: [facility];  Service: Open Heart Surgery;  Laterality: N/A;  . CORONARY STENT INTERVENTION N/A 05/30/2018   Procedure: CORONARY STENT INTERVENTION;  Surgeon: Court Dorn PARAS, MD;  Location: [facility];  Service: Cardiovascular;  Laterality: N/A;  . CYSTOSCOPY WITH RETROGRADE PYELOGRAM, URETEROSCOPY AND STENT PLACEMENT Bilateral 06/21/2020   Procedure: CYSTOSCOPY WITH RETROGRADE PYELOGRAM, AND BILATERAL STENT PLACEMENT;  Surgeon: Alvaro Hummer, MD;  Location: [facility];  Service: Urology;  Laterality: Bilateral;  . CYSTOSCOPY WITH RETROGRADE PYELOGRAM, URETEROSCOPY AND STENT PLACEMENT Bilateral 07/12/2020   Procedure: CYSTOSCOPY WITH RETROGRADE PYELOGRAM, URETEROSCOPY AND STENT PLACEMENT;  Surgeon: Cam Morene ORN, MD;  Location: [facility];  Service: Urology;  Laterality: Bilateral;  2 HRS  . EYE SURGERY     eyelid tuck bilateral  . HOLMIUM LASER APPLICATION Bilateral 07/12/2020   Procedure: HOLMIUM LASER APPLICATION;  Surgeon: Cam Morene ORN, MD;  Location: [facility];  Service: Urology;  Laterality: Bilateral;  . LAPAROTOMY N/A 05/07/2016   Procedure: EXPLORATORY LAPAROTOMY, LYLSIS OF ADHSIONS, FLO OF PERITONEAL ABSCESS;  Surgeon: Bernarda Ned, MD;  Location: [facility];  Service: General;  Laterality: N/A;  . LEFT HEART CATH AND CORONARY ANGIOGRAPHY N/A 05/30/2018   Procedure: LEFT HEART CATH AND CORONARY ANGIOGRAPHY;  Surgeon: Court Dorn PARAS, MD;  [facility];  Service: Cardiovascular;  Laterality: N/A;  . LEFT HEART CATH AND CORONARY ANGIOGRAPHY N/A 11/17/2018   Procedure: LEFT HEART CATH AND CORONARY ANGIOGRAPHY;  Surgeon: Court Dorn PARAS, MD;  Location: [facility];  Service: Cardiovascular;  Laterality: N/A;  . PARATHYROIDECTOMY     one lobe- benign per pt  . TEE WITHOUT CARDIOVERSION N/A 11/21/2018   Procedure: TRANSESOPHAGEAL ECHOCARDIOGRAM (TEE);  Surgeon: Lucas Dorise POUR, MD;  Location: [facility];  Service: Open Heart Surgery;  Laterality:  N/A;  . TONSILLECTOMY    . TOTAL HIP ARTHROPLASTY  07/08/2011   Procedure: TOTAL HIP ARTHROPLASTY;  Surgeon: Dempsey GAILS Aluisio;  Location: [facility];  Service: Orthopedics;  Laterality: Right;  . TOTAL KNEE ARTHROPLASTY Left 02/16/2022   Procedure: TOTAL KNEE ARTHROPLASTY;  Surgeon: Melodi Dempsey, MD;  Location: [facility];  Service: Orthopedics;  Laterality: Left;    Allergies  Allergen Reactions  . Morphine  Other (See Comments)    Medication did not work for pain   .  Penicillin G Other (See Comments)    penicillin G  . Clindamycin /Lincomycin   . Penicillins Other (See Comments)    UNSPECIFIED REACTION  Unknown reaction as a child.  Tolerated Zosyn  2017. Tolerated Cephalosporin Date: 02/17/22.    . Sulfamethoxazole-Trimethoprim     Allergies as of 06/15/2024       Reactions   Morphine  Other (See Comments)   Medication did not work for pain    Penicillin G Other (See Comments)   penicillin G   Clindamycin /lincomycin    Penicillins Other (See Comments)   UNSPECIFIED REACTION  Unknown reaction as a child.  Tolerated Zosyn  2017. Tolerated Cephalosporin Date: 02/17/22.   Sulfamethoxazole-trimethoprim         Medication List        Accurate as of June 15, 2024  3:23 PM. If you have any questions, ask your nurse or doctor.          albuterol  108 (90 Base) MCG/ACT inhaler Commonly known as: VENTOLIN  HFA Inhale 2 puffs into the lungs every 6 (six) hours as needed for wheezing or shortness of breath.   amLODipine  5 MG tablet Commonly known as: NORVASC  TAKE 1 TABLET BY MOUTH DAILY   atorvastatin  80 MG tablet Commonly known as: LIPITOR  Take 1 tablet (80 mg total) by mouth daily.   Biotrue Soln Place 1 drop into both eyes as needed (dry eyes).   budesonide-formoterol  80-4.5 MCG/ACT inhaler Commonly known as: SYMBICORT Inhale 2 puffs into the lungs in the morning and at bedtime. Also uses as needed for emergency purposes   carvedilol  12.5 MG tablet Commonly known  as: COREG  TAKE 1 TABLET BY MOUTH 2 TIMES A DAY   cetirizine 10 MG tablet Commonly known as: ZYRTEC Take 10 mg by mouth at bedtime.   clopidogrel  75 MG tablet Commonly known as: PLAVIX  Take 1 tablet (75 mg total) by mouth daily.   furosemide  20 MG tablet Commonly known as: LASIX  Take 1 tablet (20 mg total) by mouth as needed.   ipratropium 0.03 % nasal spray Commonly known as: ATROVENT Place 2 sprays into both nostrils 3 (three) times daily as needed for rhinitis.   levothyroxine  25 MCG tablet Commonly known as: SYNTHROID  TAKE 1 TABLET BY MOUTH DAILY   lisinopril  40 MG tablet Commonly known as: ZESTRIL  Take 1 tablet (40 mg total) by mouth daily.   magic mouthwash (nystatin , diphenhydrAMINE , alum & mag hydroxide) suspension mixture Take 5 mLs by mouth 4 (four) times daily. Suspension contains equal amounts of Maalox Extra Strength, nystatin , and diphenhydramine .   Mounjaro  15 MG/0.5ML Pen Generic drug: tirzepatide  Inject 15 mg into the skin once a week.   MULTIVITAMIN WOMEN 50+ PO Take 1 tablet by mouth daily.   nitroGLYCERIN  0.4 MG SL tablet Commonly known as: NITROSTAT  Place 1 tablet (0.4 mg total) under the tongue as needed.   OVER THE COUNTER MEDICATION Apply 1 Application topically as needed (pain). Magna Sport Balm   pantoprazole  40 MG tablet Commonly known as: PROTONIX  Take 1 tablet (40 mg total) by mouth 2 (two) times daily before a meal.   Pepcid  AC 10 MG tablet Generic drug: famotidine  Take 10 mg by mouth daily as needed for heartburn or indigestion.   Phazyme Ultimate 500 MG Caps Generic drug: Simethicone  Take 500 mg by mouth daily as needed (bloating).   potassium chloride  SA 20 MEQ tablet Commonly known as: KLOR-CON  M Take 1 tablet (20 mEq total) by mouth as needed.   pregabalin  25 MG capsule Commonly  known as: LYRICA  Take 1 capsule (25 mg total) by mouth daily.   sucralfate  1 g tablet Commonly known as: Carafate  Take your Protonix  at least  30 minutes prior to your first morning dose of Carafate .        Review of Systems  Constitutional:  Negative for appetite change, fatigue and unexpected weight change.  HENT:  Positive for mouth sores. Negative for congestion, sore throat and trouble swallowing.   Eyes:  Negative for visual disturbance.  Respiratory:  Negative for chest tightness and shortness of breath.        DOE occasionally.   Cardiovascular:  Negative for chest pain, palpitations and leg swelling.  Gastrointestinal:  Negative for abdominal pain and constipation.       Hx of colon abscess  Genitourinary:  Negative for dysuria, frequency and urgency.       Bathroom trip x1/night, s/p hysterectomy, hx of kidney stones.   Musculoskeletal:  Positive for arthralgias. Negative for back pain and gait problem.       Left knee pain, ortho scar tissue, resolved Spinal stenosis, 3 inj, pending surgery, numbness in legs, no incontinent of B+B  Skin:  Negative for color change.  Neurological:  Positive for numbness. Negative for dizziness, speech difficulty, weakness and headaches.       Tingling, numbness   Psychiatric/Behavioral:  Negative for behavioral problems and sleep disturbance. The patient is nervous/anxious.     Immunization History  Administered Date(s) Administered  . INFLUENZA, HIGH DOSE SEASONAL PF 10/21/2017, 09/19/2019, 09/17/2021, 06/11/2024  . Influenza,inj,Quad PF,6+ Mos 08/17/2023  . Influenza-Unspecified 05/03/2023  . PFIZER(Purple Top)SARS-COV-2 Vaccination 08/22/2019, 09/11/2019, 09/29/2019, 04/29/2020, 05/13/2020  . PNEUMOCOCCAL CONJUGATE-20 12/29/2022  . Pfizer Covid-19 Vaccine Bivalent Booster 77yrs & up 07/09/2021, 05/03/2023  . Pfizer(Comirnaty)Fall Seasonal Vaccine 12 years and older 06/11/2024  . Pneumococcal Conjugate PCV 7 08/03/2004  . Pneumococcal Polysaccharide-23 10/21/2017, 05/31/2018, 08/11/2018, 09/19/2019, 04/29/2020, 12/04/2020, 09/17/2021, 08/17/2023  . RSV,unspecified 05/03/2023   . Tdap 02/21/2014  . Unspecified SARS-COV-2 Vaccination 09/08/2019, 09/29/2019   Pertinent  Health Maintenance Due  Topic Date Due  . OPHTHALMOLOGY EXAM  05/03/2024  . FOOT EXAM  06/16/2024  . HEMOGLOBIN A1C  07/11/2024  . Influenza Vaccine  Completed  . DEXA SCAN  Completed      08/12/2023    3:37 PM 09/03/2023    2:57 PM 09/10/2023   11:00 AM 11/11/2023    2:19 PM 06/15/2024    2:58 PM  Fall Risk  Falls in the past year? 0 0 0 0 0  Was there an injury with Fall? 0 0 0 0 0  Fall Risk Category Calculator 0 0 0 0 0  Patient at Risk for Falls Due to  No Fall Risks No Fall Risks No Fall Risks No Fall Risks  Fall risk Follow up  Falls evaluation completed Falls evaluation completed;Education provided;Falls prevention discussed Falls evaluation completed Falls evaluation completed   Functional Status Survey:    Vitals:   06/15/24 1503  BP: 126/78  Pulse: 84  Resp: 18  Temp: (!) 97.4 F (36.3 C)  SpO2: 96%  Weight: 163 lb 3.2 oz (74 kg)  Height: 5' 5 (1.651 m)   Body mass index is 27.16 kg/m. Physical Exam Constitutional:      Appearance: Normal appearance.  HENT:     Head: Normocephalic and atraumatic.     Nose: Nose normal.     Mouth/Throat:     Mouth: Mucous membranes are moist.     Pharynx: No  posterior oropharyngeal erythema.     Comments: White/yellow coated tongue.  Eyes:     Conjunctiva/sclera: Conjunctivae normal.  Cardiovascular:     Rate and Rhythm: Normal rate and regular rhythm.  Pulmonary:     Effort: Pulmonary effort is normal.     Breath sounds: No wheezing.  Abdominal:     General: Bowel sounds are normal.     Palpations: Abdomen is soft.  Musculoskeletal:        General: Normal range of motion.     Cervical back: Normal range of motion.     Right lower leg: No edema.     Left lower leg: No edema.     Comments: Trace edema Limited L shoulder ROM  Skin:    General: Skin is warm and dry.  Neurological:     General: No focal deficit present.      Mental Status: She is alert and oriented to person, place, and time.  Psychiatric:        Mood and Affect: Mood normal.        Behavior: Behavior normal.        Thought Content: Thought content normal.        Judgment: Judgment normal.     Labs reviewed: Recent Labs    08/10/23 0713 09/16/23 1438 01/10/24 1117  NA 141 140 139  K 3.7 5.3* 4.6  CL 104 102 101  CO2 27 20 16*  GLUCOSE 107* 141* 108*  BUN 22 34* 31*  CREATININE 0.65 1.20* 0.99  CALCIUM  9.3 9.9 10.4*   Recent Labs    08/10/23 0713  AST 18  ALT 28  BILITOT 0.7  PROT 5.7*   Recent Labs    08/10/23 0713  WBC 8.8  NEUTROABS 5,350  HGB 12.8  HCT 39.1  MCV 90.9  PLT 260   Lab Results  Component Value Date   TSH 2.210 01/10/2024   Lab Results  Component Value Date   HGBA1C 5.7 (H) 01/10/2024   Lab Results  Component Value Date   CHOL 186 08/10/2023   HDL 53 08/10/2023   LDLCALC 106 (H) 08/10/2023   TRIG 154 (H) 08/10/2023   CHOLHDL 3.5 08/10/2023    Significant Diagnostic Results in last 30 days:  No results found.  Assessment/Plan  Essential hypertension  controlled, on  Amlodipine , Carvedilol , Lisinopril . Bun/creat 31/0.99 01/10/24  Type 2 diabetes mellitus with cardiac complication (HCC) 08/10/23 ACR 11, Hgb A1c 5.7 01/10/24 Diet controlled.   Dyslipidemia, goal LDL below 70  taking Atorvastatin , LDL 106 08/10/23  Neuropathy off Gabapentin , placed on Lyrica  per patient's request.   Osteoarthritis, multiple sites  s/p L knee replacement, R hip, L shoulder pain-limited ROM, s/p Ortho, no further surgery, on Tramadol . Vit D 50 01/12/23  CAD S/P percutaneous coronary angioplasty s/p CABG x3 11/21/18, (prior stent drug eluting), hx of tachycardia, on oral nitrate, Plavix , Carvedilol , Lisinopril , followed by cardiology. TSH 2.2 01/10/24  GERD (gastroesophageal reflux disease) Stable,  on Famotidine , Pantoprazole , Sucralfate , Hgb 12.8 08/10/23  Asthma Stable,  Symbicort, Atrovent nasal  spray, dyspnea.   Spinal stenosis Spinal stenosis, 3 inj, pending surgery, numbness in legs, no incontinent of B+B   Family/ staff Communication: ***  Labs/tests ordered:  ***

## 2024-06-15 NOTE — Assessment & Plan Note (Signed)
 taking Atorvastatin, LDL 106 08/10/23

## 2024-06-15 NOTE — Assessment & Plan Note (Signed)
 controlled, on  Amlodipine , Carvedilol , Lisinopril . Bun/creat 31/0.99 01/10/24

## 2024-06-15 NOTE — Assessment & Plan Note (Signed)
s/p L knee replacement, R hip, L shoulder pain-limited ROM, s/p Ortho, no further surgery, on Tramadol. Vit D 50 01/12/23

## 2024-06-15 NOTE — Assessment & Plan Note (Signed)
 s/p CABG x3 11/21/18, (prior stent drug eluting), hx of tachycardia, on oral nitrate, Plavix , Carvedilol , Lisinopril , followed by cardiology. TSH 2.2 01/10/24

## 2024-06-15 NOTE — Assessment & Plan Note (Signed)
 off Gabapentin, placed on Lyrica per patient's request.

## 2024-06-15 NOTE — Assessment & Plan Note (Signed)
 Stable,  on Famotidine, Pantoprazole, Sucralfate, Hgb 12.8 08/10/23

## 2024-06-15 NOTE — Assessment & Plan Note (Signed)
 Stable,  Symbicort, Atrovent nasal spray, dyspnea.

## 2024-06-15 NOTE — Patient Instructions (Signed)
 F/u Clinic [facility] 6 months

## 2024-06-15 NOTE — Assessment & Plan Note (Signed)
 Spinal stenosis, 3 inj, pending surgery, numbness in legs, no incontinent of B+B

## 2024-06-16 ENCOUNTER — Encounter: Payer: Self-pay | Admitting: Nurse Practitioner

## 2024-06-22 MED ORDER — MOUNJARO 15 MG/0.5ML ~~LOC~~ SOAJ: 15.0000 mg | SUBCUTANEOUS | 1 refills | Status: DC

## 2024-06-22 NOTE — Addendum Note (Signed)
 Addended by: Mckaylah Bettendorf L on: 06/22/2024 01:51 PM   Modules accepted: Orders

## 2024-07-06 ENCOUNTER — Ambulatory Visit: Payer: Self-pay | Admitting: Physician Assistant

## 2024-07-06 LAB — LIPID PANEL
Chol/HDL Ratio: 2.1 ratio (ref 0.0–4.4)
Cholesterol, Total: 103 mg/dL (ref 100–199)
HDL: 48 mg/dL (ref >39)
LDL Chol Calc (NIH): 38 mg/dL (ref 0–99)
Triglycerides: 83 mg/dL (ref 0–149)
VLDL Cholesterol Cal: 17 mg/dL (ref 5–40)

## 2024-07-06 LAB — HEPATIC FUNCTION PANEL
ALT: 13 IU/L (ref 0–32)
AST: 16 IU/L (ref 0–40)
Albumin: 4.2 g/dL (ref 3.7–4.7)
Alkaline Phosphatase: 73 IU/L (ref 48–129)
Bilirubin Total: 1.2 mg/dL (ref 0.0–1.2)
Bilirubin, Direct: 0.41 mg/dL — AB (ref 0.00–0.40)
Total Protein: 5.9 g/dL — ABNORMAL LOW (ref 6.0–8.5)

## 2024-07-11 ENCOUNTER — Encounter: Payer: Self-pay | Admitting: Cardiovascular Disease

## 2024-07-11 ENCOUNTER — Ambulatory Visit: Attending: Cardiovascular Disease | Admitting: Cardiovascular Disease

## 2024-07-11 VITALS — BP 102/58 | HR 82 | Ht 65.0 in | Wt 158.0 lb

## 2024-07-11 DIAGNOSIS — I10 Essential (primary) hypertension: Secondary | ICD-10-CM

## 2024-07-11 DIAGNOSIS — I25810 Atherosclerosis of coronary artery bypass graft(s) without angina pectoris: Secondary | ICD-10-CM | POA: Diagnosis not present

## 2024-07-11 DIAGNOSIS — Z01818 Encounter for other preprocedural examination: Secondary | ICD-10-CM | POA: Diagnosis not present

## 2024-07-11 DIAGNOSIS — E785 Hyperlipidemia, unspecified: Secondary | ICD-10-CM

## 2024-07-11 NOTE — Progress Notes (Signed)
 07/11/2024 Monique [name]   Aug 24, 1940  [id number]  Primary Physician Mast, Man X, NP Primary Cardiologist: Dorn [name], [place], FSCAI  HPI:  Monique [name] is a 83 y.o.   moderately overweight widowed Caucasian female mother of 2, grandmother and 2 grandchildren's husband Elza was a long-term patient of mine who died 02-Sep-2016. She was the public house manager of lifelong [employer] She was referred by Verneita Piety NP  for a symptomatic sinus tachycardia.  I last saw her in the office 03/23/2023.Monique [name] Her cardiac risk factors include treated hypertension and hyperlipidemia patient is a family history of heart disease with father who died of a myocardial function and age 12. She has never had a heart attack or stroke. She denies chest pain or shortness of breath. She does exercise and does yoga as well. She has reactive airways disease. She is noted to be tachycardic by her PCP was referred here for further evaluation.   When I saw her in the office 05/27/2018 she was complaining of increasing dyspnea and exertional chest pain.  I performed radial diagnostic cath 05/22/2018 revealing high-grade calcified proximal LAD stenosis.  I stented her with a 2.5 mm x 20 mm long Synergy drug-eluting stent however there was a small area that would not completely expand which left her with a fairly focal 50% stenosis within the dilated segment.  She also had an 80% small ramus branch stenosis which was untreated.  Her angina has resolved her dyspnea has improved.  She was significantly anemic with a hemoglobin of 8.1 during her hospitalization which is improved with iron repletion up to the low 9 range.   She had done well until earlier this month when she started developing crescendo angina.  She was seen by Dr. Loni  on 11/04/2018 who adjusted her medications and increased her long-acting oral nitrate.  She continues to have effort angina which is fairly reproducible as well as some  unstable symptoms at night.   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  She underwent CABG x3 by Dr. Lucas 11/21/2018 with a LIMA to the LAD, vein to an obtuse marginal branch and ramus branch.  Her postop course was uncomplicated.  She  recuperated nicely and  and participated in the cardiac rehab program.   Since I saw her in the office a year and a half ago she has since moved into her friend's home and is enjoying her new environment.  She was started on Mounjaro  and has lost 42 pounds.  She feels clinically improved.  She denies chest pain or shortness of breath.  She does have spinal stenosis and is seeing Dr. Alm Molt, neurosurgeon, for consideration of addressing this surgically.  She did have a 2D echo performed 09/10/2023 revealed normal LV systolic function without significant valvular abnormalities.   Current Meds  Medication Sig   albuterol  (VENTOLIN  HFA) 108 (90 Base) MCG/ACT inhaler Inhale 2 puffs into the lungs every 6 (six) hours as needed for wheezing or shortness of breath.   amLODipine  (NORVASC ) 5 MG tablet TAKE 1 TABLET BY MOUTH DAILY   atorvastatin  (LIPITOR ) 80 MG tablet Take 1 tablet (80 mg total) by mouth daily.   budesonide-formoterol  (SYMBICORT) 80-4.5 MCG/ACT inhaler Inhale 2 puffs into the lungs in the morning and at bedtime. Also uses as needed for emergency purposes   carvedilol  (COREG ) 12.5 MG tablet TAKE 1 TABLET BY MOUTH  2 TIMES A DAY   cetirizine (ZYRTEC) 10 MG tablet Take 10 mg by mouth at bedtime.   clopidogrel  (PLAVIX ) 75 MG tablet Take 1 tablet (75 mg total) by mouth daily.   furosemide  (LASIX ) 20 MG tablet Take 1 tablet (20 mg total) by mouth as needed.   ipratropium (ATROVENT) 0.03 % nasal spray Place 2 sprays into both nostrils 3 (three) times daily as needed for rhinitis.   levothyroxine  (SYNTHROID ) 25 MCG tablet TAKE 1 TABLET BY MOUTH DAILY   lisinopril  (ZESTRIL )  40 MG tablet Take 1 tablet (40 mg total) by mouth daily.   Multiple Vitamins-Minerals (MULTIVITAMIN WOMEN 50+ PO) Take 1 tablet by mouth daily.   nitroGLYCERIN  (NITROSTAT ) 0.4 MG SL tablet Place 1 tablet (0.4 mg total) under the tongue as needed.   OVER THE COUNTER MEDICATION Apply 1 Application topically as needed (pain). Magna Sport Balm   pantoprazole  (PROTONIX ) 40 MG tablet Take 1 tablet (40 mg total) by mouth 2 (two) times daily before a meal.   pregabalin  (LYRICA ) 25 MG capsule Take 1 capsule (25 mg total) by mouth daily.   Simethicone  (PHAZYME ULTIMATE) 500 MG CAPS Take 500 mg by mouth daily as needed (bloating).   Soft Lens Products (BIOTRUE) SOLN Place 1 drop into both eyes as needed (dry eyes).   tirzepatide  (MOUNJARO ) 15 MG/0.5ML Pen Inject 15 mg into the skin once a week.     Allergies  Allergen Reactions   Morphine  Other (See Comments)    Medication did not work for pain    Penicillin G Other (See Comments)    penicillin G   Clindamycin /Lincomycin    Penicillins Other (See Comments)    UNSPECIFIED REACTION  Unknown reaction as a child.  Tolerated Zosyn  2017. Tolerated Cephalosporin Date: 02/17/22.     Sulfamethoxazole-Trimethoprim     Social History   Socioeconomic History   Marital status: Widowed    Spouse name: Not on file   Number of children: 2   Years of education: Not on file   Highest education level: Not on file  Occupational History   Occupation: retired  Tobacco Use   Smoking status: Never   Smokeless tobacco: Never  Vaping Use   Vaping status: Never Used  Substance and Sexual Activity   Alcohol use: Yes    Comment: socially- 2 x year   Drug use: No   Sexual activity: Not on file    Comment: Hysterectomy  Other Topics Concern   Not on file  Social History Narrative   Not on file   Social Drivers of Health   Financial Resource Strain: Low Risk (10/22/2022)   Received from [facility]   Overall Financial Resource Strain (CARDIA)     Difficulty of Paying Living Expenses: Not hard at all  Food Insecurity: No Food Insecurity (11/11/2023)   Hunger Vital Sign    Worried About Running Out of Food in the Last Year: Never true    Ran Out of Food in the Last Year: Never true  Transportation Needs: No Transportation Needs (11/11/2023)   PRAPARE - Administrator, Civil Service (Medical): No    Lack of Transportation (Non-Medical): No  Physical Activity: Insufficiently Active (12/30/2021)   Received from [facility]   Exercise Vital Sign    On average, how many days per week do you engage in moderate to strenuous exercise (like a brisk walk)?: 1 day    On average, how many minutes do you engage in exercise at  this level?: 30 min  Stress: Stress Concern Present (12/30/2021)   Received from [facility] - Occupational Stress Questionnaire    Feeling of Stress : To some extent  Social Connections: Socially Integrated (12/30/2021)   Received from [facility]   Social Network    How would you rate your social network (family, work, friends)?: Good participation with social networks  Intimate Partner Violence: Not At Risk (11/11/2023)   Humiliation, Afraid, Rape, and Kick questionnaire    Fear of Current or Ex-Partner: No    Emotionally Abused: No    Physically Abused: No    Sexually Abused: No     Review of Systems: General: negative for chills, fever, night sweats or weight changes.  Cardiovascular: negative for chest pain, dyspnea on exertion, edema, orthopnea, palpitations, paroxysmal nocturnal dyspnea or shortness of breath Dermatological: negative for rash Respiratory: negative for cough or wheezing Urologic: negative for hematuria Abdominal: negative for nausea, vomiting, diarrhea, bright red blood per rectum, melena, or hematemesis Neurologic: negative for visual changes, syncope, or dizziness All other systems reviewed and are otherwise negative except as noted  above.    Blood pressure (!) 102/58, pulse 82, height 5' 5 (1.651 m), weight 158 lb (71.7 kg), SpO2 95%.  General appearance: alert and no distress Neck: no adenopathy, no carotid bruit, no JVD, supple, symmetrical, trachea midline, and thyroid not enlarged, symmetric, no tenderness/mass/nodules Lungs: clear to auscultation bilaterally Heart: regular rate and rhythm, S1, S2 normal, no murmur, click, rub or gallop Extremities: extremities normal, atraumatic, no cyanosis or edema Pulses: 2+ and symmetric Skin: Skin color, texture, turgor normal. No rashes or lesions Neurologic: Grossly normal  EKG EKG Interpretation Date/Time:  Tuesday July 11 2024 13:27:32 EST Ventricular Rate:  82 PR Interval:  180 QRS Duration:  86 QT Interval:  342 QTC Calculation: 399 R Axis:   -7  Text Interpretation: Normal sinus rhythm Nonspecific ST and T wave abnormality When compared with ECG of 20-Sep-2023 13:55, Nonspecific T wave abnormality, improved in Inferior leads Nonspecific T wave abnormality has replaced inverted T waves in Lateral leads Confirmed by Court Carrier [phone]) on 07/11/2024 1:29:57 PM    ASSESSMENT AND PLAN:   Essential hypertension History of essential hypertension blood pressure measured today at 102/58.  She is on amlodipine , carvedilol  and lisinopril .  Dyslipidemia, goal LDL below 70 History of dyslipidemia on high-dose statin therapy with lipid profile performed 07/05/2024 revealing total cholesterol 103, LDL 38 and HDL of 48.  CAD S/P percutaneous coronary angioplasty History of CAD status post LAD intervention by myself for high-grade proximal calcified stenosis 05/22/2018 using a 2.5 mm x 20 mm long Synergy drug-eluting stent.  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  She ultimately underwent CABG x 3 by Dr. Gershon 11/21/18 for LIMA to her LAD and recuperated nicely.  She currently denies chest pain or shortness of  breath.  Preoperative clearance Patient apparently has spinal stenosis and is symptomatic.  She has seen Dr. Alm Molt for consideration of surgical correction of this.  She denies chest pain.  She did have a 2D echo performed 09/10/2023 that revealed EF of 50 to 55% without valvular abnormalities.  I am going to get a Lexiscan Myoview to preoperatively cleared her.     Carrier DOROTHA Court MD FACP,FACC,FAHA, [facility] 07/11/2024 1:42 PM

## 2024-07-11 NOTE — Patient Instructions (Signed)
 Medication Instructions:  Your physician recommends that you continue on your current medications as directed. Please refer to the Current Medication list given to you today.  *If you need a refill on your cardiac medications before your next appointment, please call your pharmacy*   Testing/Procedures: Dr. Court has ordered a Lexiscan Myocardial Perfusion Imaging Study.  Please arrive 15 minutes prior to your appointment time for registration and insurance purposes.   The test will take approximately 3 to 4 hours to complete; you may bring reading material.  If someone comes with you to your appointment, they will need to remain in the main lobby due to limited space in the testing area. **If you are pregnant or breastfeeding, please notify the nuclear lab prior to your appointment**   How to prepare for your Myocardial Perfusion Test: Do not eat or drink 3 hours prior to your test, except you may have water . Do not consume products containing caffeine (regular or decaffeinated) 12 hours prior to your test. (ex: coffee, chocolate, sodas, tea). Do wear comfortable clothes (no dresses or overalls) and walking shoes, tennis shoes preferred (No heels or open toe shoes are allowed). Do NOT wear cologne, perfume, aftershave, or lotions (deodorant is allowed). If you use an inhaler, use it the AM of your test and bring it with you.  If you use a nebulizer, use it the AM of your test.  If these instructions are not followed, your test will have to be rescheduled.   Follow-Up: At [facility], you and your health needs are our priority.  As part of our continuing mission to provide you with exceptional heart care, our providers are all part of one team.  This team includes your primary Cardiologist (physician) and Advanced Practice Providers or APPs (Physician Assistants and Nurse Practitioners) who all work together to provide you with the care you need, when you need it.  Your next  appointment:   6 month(s)  Provider:   Hao Meng, PA-C         Then, Dorn Court, MD will plan to see you again in 12 month(s).

## 2024-07-11 NOTE — Assessment & Plan Note (Signed)
 History of essential hypertension blood pressure measured today at 102/58.  She is on amlodipine , carvedilol  and lisinopril .

## 2024-07-11 NOTE — Assessment & Plan Note (Signed)
 History of dyslipidemia on high-dose statin therapy with lipid profile performed 07/05/2024 revealing total cholesterol 103, LDL 38 and HDL of 48.

## 2024-07-11 NOTE — Assessment & Plan Note (Signed)
 History of CAD status post LAD intervention by myself for high-grade proximal calcified stenosis 05/22/2018 using a 2.5 mm x 20 mm long Synergy drug-eluting stent.  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  She ultimately underwent CABG x 3 by Dr. Gershon 11/21/18 for LIMA to her LAD and recuperated nicely.  She currently denies chest pain or shortness of breath.

## 2024-07-11 NOTE — Assessment & Plan Note (Signed)
 Patient apparently has spinal stenosis and is symptomatic.  She has seen Dr. Alm Molt for consideration of surgical correction of this.  She denies chest pain.  She did have a 2D echo performed 09/10/2023 that revealed EF of 50 to 55% without valvular abnormalities.  I am going to get a Lexiscan  Myoview  to preoperatively cleared her.

## 2024-07-13 ENCOUNTER — Other Ambulatory Visit: Payer: Self-pay | Admitting: Nurse Practitioner

## 2024-07-13 NOTE — Telephone Encounter (Signed)
 Copied from CRM #[id number]. Topic: Clinical - Medication Refill >> Jul 13, 2024  9:31 AM Graeme ORN wrote: Medication: pregabalin  (LYRICA ) 25 MG capsule  Has the patient contacted their pharmacy? Yes - states she requested through pharmacy days ago and has not heard back (Agent: If no, request that the patient contact the pharmacy for the refill. If patient does not wish to contact the pharmacy document the reason why and proceed with request.) (Agent: If yes, when and what did the pharmacy advise?)  This is the patient's preferred pharmacy:  [facility] [id number] - [place], [place] - [address] [place] [address] Phone: [phone] Fax: [phone]  Is this the correct pharmacy for this prescription? Yes If no, delete pharmacy and type the correct one.   Has the prescription been filled recently? Yes  Is the patient out of the medication? Yes  Has the patient been seen for an appointment in the last year OR does the patient have an upcoming appointment? Yes  Can we respond through MyChart? Yes  Agent: Please be advised that Rx refills may take up to 3 business days. We ask that you follow-up with your pharmacy.

## 2024-07-14 ENCOUNTER — Other Ambulatory Visit: Payer: Self-pay | Admitting: Cardiovascular Disease

## 2024-07-14 DIAGNOSIS — Z01818 Encounter for other preprocedural examination: Secondary | ICD-10-CM

## 2024-07-14 DIAGNOSIS — I25810 Atherosclerosis of coronary artery bypass graft(s) without angina pectoris: Secondary | ICD-10-CM

## 2024-07-14 DIAGNOSIS — E785 Hyperlipidemia, unspecified: Secondary | ICD-10-CM

## 2024-07-14 MED ORDER — PREGABALIN 25 MG PO CAPS: 25.0000 mg | ORAL_CAPSULE | Freq: Every day | ORAL | 0 refills | Status: AC

## 2024-07-14 NOTE — Telephone Encounter (Signed)
 Patient is requesting a refill of the following medications: Requested Prescriptions   Pending Prescriptions Disp Refills   pregabalin  (LYRICA ) 25 MG capsule 30 capsule 5    Sig: Take 1 capsule (25 mg total) by mouth daily.    Date of last refill: 01/31/2024  Refill amount: 30/5  Treatment agreement date: Not on file and no pending appointment

## 2024-07-17 ENCOUNTER — Telehealth (HOSPITAL_COMMUNITY): Payer: Self-pay

## 2024-07-17 NOTE — Telephone Encounter (Signed)
 Detailed instructions left on the patient's answering machine. Monique [name] CCT

## 2024-07-18 ENCOUNTER — Ambulatory Visit: Payer: Self-pay | Admitting: Cardiovascular Disease

## 2024-07-18 ENCOUNTER — Inpatient Hospital Stay (HOSPITAL_COMMUNITY)
Admission: RE | Admit: 2024-07-18 | Discharge: 2024-07-18 | Disposition: A | Source: Ambulatory Visit | Attending: Cardiovascular Disease | Admitting: Cardiovascular Disease

## 2024-07-18 DIAGNOSIS — E785 Hyperlipidemia, unspecified: Secondary | ICD-10-CM | POA: Insufficient documentation

## 2024-07-18 DIAGNOSIS — I25810 Atherosclerosis of coronary artery bypass graft(s) without angina pectoris: Secondary | ICD-10-CM | POA: Diagnosis present

## 2024-07-18 DIAGNOSIS — Z01818 Encounter for other preprocedural examination: Secondary | ICD-10-CM

## 2024-07-18 LAB — MYOCARDIAL PERFUSION IMAGING
LV dias vol: 78 mL (ref 46–106)
LV sys vol: 29 mL (ref 3.8–5.2)
Nuc Stress EF: 58 %
Peak HR: 100 {beats}/min
Rest HR: 79 {beats}/min
Rest Nuclear Isotope Dose: 10.5 mCi
SDS: 1
SRS: 6
SSS: 0
ST Depression (mm): 0 mm
Stress Nuclear Isotope Dose: 32.6 mCi
TID: 1.12

## 2024-07-18 MED ORDER — REGADENOSON 0.4 MG/5ML IV SOLN
0.4000 mg | Freq: Once | INTRAVENOUS | Status: AC
  Administered 2024-07-18: 13:00:00 0.4 mg via INTRAVENOUS

## 2024-07-18 MED ORDER — TECHNETIUM TC 99M TETROFOSMIN IV KIT
10.5000 | PACK | Freq: Once | INTRAVENOUS | Status: AC | PRN
  Administered 2024-07-18: 11:00:00 10.5 via INTRAVENOUS

## 2024-07-18 MED ORDER — TECHNETIUM TC 99M TETROFOSMIN IV KIT
32.6000 | PACK | Freq: Once | INTRAVENOUS | Status: AC | PRN
  Administered 2024-07-18: 13:00:00 32.6 via INTRAVENOUS

## 2024-07-18 MED ORDER — REGADENOSON 0.4 MG/5ML IV SOLN
INTRAVENOUS | Status: AC
  Filled 2024-07-18: qty 5

## 2024-07-21 ENCOUNTER — Other Ambulatory Visit: Payer: Self-pay | Admitting: Neurological Surgery

## 2024-07-31 ENCOUNTER — Other Ambulatory Visit: Payer: Self-pay | Admitting: Nurse Practitioner

## 2024-07-31 DIAGNOSIS — I10 Essential (primary) hypertension: Secondary | ICD-10-CM

## 2024-08-04 ENCOUNTER — Encounter (HOSPITAL_COMMUNITY): Payer: Self-pay

## 2024-08-04 NOTE — Progress Notes (Signed)
 Surgical Instructions   Your procedure is scheduled on Friday, 08/11/24. Report to [facility] Main Entrance A at 11:35 A.M., then check in with the Admitting office. Any questions or running late day of surgery: call [phone]  Questions prior to your surgery date: call [phone], Monday-Friday, 8am-4pm. If you experience any cold or flu symptoms such as cough, fever, chills, shortness of breath, etc. between now and your scheduled surgery, please notify us  at the above number.     Remember:  Do not eat after midnight the night before your surgery  You may drink clear liquids until 10:30 AM,  the morning of your surgery.   Clear liquids allowed are: Water , Non-Citrus Juices (without pulp), Carbonated Beverages, Clear Tea (no milk, honey, etc.), Black Coffee Only (NO MILK, CREAM OR POWDERED CREAMER of any kind), and Gatorade.    Take these medicines the morning of surgery with A SIP OF WATER : amLODipine  (NORVASC )   atorvastatin  (LIPITOR )   carvedilol  (COREG )  cetirizine (ZYRTEC)  levothyroxine  (SYNTHROID ) pantoprazole  (PROTONIX )  pregabalin  (LYRICA )     May take these medicines IF NEEDED: albuterol  (VENTOLIN ) ATROVENT NASAL SPRAY nitroGLYCERIN  (NITROSTAT ) - If you need to use this on day of surgery, please  call [phone] to let us  know. Simethicone  (PHAZYME ULTIMATE  Soft Lens Products (BIOTRUE) SOLN    Hold Mounjaro  for 7 day prior to procedure.  Last dose will be on or before 08/03/24   One week prior to surgery, STOP taking any Aspirin  (unless otherwise instructed by your surgeon) Aleve , Naproxen , Ibuprofen , Motrin , Advil , Goody's, BC's, all herbal medications, fish oil, and non-prescription vitamins.                     Do NOT Smoke (Tobacco/Vaping) for 24 hours prior to your procedure.  If you use a CPAP at night, you may bring your mask/headgear for your overnight stay.   You will be asked to remove any contacts, glasses, piercing's, hearing aid's,  dentures/partials prior to surgery. Please bring cases for these items if needed.    Patients discharged the day of surgery will not be allowed to drive home, and someone needs to stay with them for 24 hours.  SURGICAL WAITING ROOM VISITATION Patients may have no more than 2 support people in the waiting area - these visitors may rotate.   Pre-op nurse will coordinate an appropriate time for 1 ADULT support person, who may not rotate, to accompany patient in pre-op.  Children under the age of 35 must have an adult with them who is not the patient and must remain in the main waiting area with an adult.  If the patient needs to stay at the hospital during part of their recovery, the visitor guidelines for inpatient rooms apply.  Please refer to the [facility] website for the visitor guidelines for any additional information.   If you received a COVID test during your pre-op visit  it is requested that you wear a mask when out in public, stay away from anyone that may not be feeling well and notify your surgeon if you develop symptoms. If you have been in contact with anyone that has tested positive in the last 10 days please notify you surgeon.      Pre-operative 4 CHG Bathing Instructions   You can play a key role in reducing the risk of infection after surgery. Your skin needs to be as free of germs as possible. You can reduce the number of germs on your  skin by washing with CHG (chlorhexidine  gluconate) soap before surgery. CHG is an antiseptic soap that kills germs and continues to kill germs even after washing.   DO NOT use if you have an allergy to chlorhexidine /CHG or antibacterial soaps. If your skin becomes reddened or irritated, stop using the CHG and notify one of our RNs at [phone].   Please shower with the CHG soap starting 4 days before surgery using the following schedule:     Please keep in mind the following:  DO NOT shave, including legs and underarms, starting the  day of your first shower.   You may shave your face at any point before/day of surgery.  Place clean sheets on your bed the day you start using CHG soap. Use a clean washcloth (not used since being washed) for each shower. DO NOT sleep with pets once you start using the CHG.   CHG Shower Instructions:  Wash your face and private area with normal soap. If you choose to wash your hair, wash first with your normal shampoo.  After you use shampoo/soap, rinse your hair and body thoroughly to remove shampoo/soap residue.  Turn the water  OFF and apply  bottle of CHG soap to a CLEAN washcloth.  Apply CHG soap ONLY FROM YOUR NECK DOWN TO YOUR TOES (washing for 3-5 minutes)  DO NOT use CHG soap on face, private areas, open wounds, or sores.  Pay special attention to the area where your surgery is being performed.  If you are having back surgery, having someone wash your back for you may be helpful. Wait 2 minutes after CHG soap is applied, then you may rinse off the CHG soap.  Pat dry with a clean towel  Put on clean clothes/pajamas   If you choose to wear lotion, please use ONLY the CHG-compatible lotions that are listed below.  Additional instructions for the day of surgery:  If you choose, you may shower the morning of surgery with an antibacterial soap.  DO NOT APPLY any lotions, deodorants, cologne, or perfumes.   Do not bring valuables to the hospital. [facility] is not responsible for any belongings/valuables. Do not wear nail polish, gel polish, artificial nails, or any other type of covering on natural nails (fingers and toes) Do not wear jewelry or makeup Put on clean/comfortable clothes.  Please brush your teeth.  Ask your nurse before applying any prescription medications to the skin.     CHG Compatible Lotions   Aveeno Moisturizing lotion  Cetaphil Moisturizing Cream  Cetaphil Moisturizing Lotion  Clairol Herbal Essence Moisturizing Lotion, Dry Skin  Clairol Herbal Essence  Moisturizing Lotion, Extra Dry Skin  Clairol Herbal Essence Moisturizing Lotion, Normal Skin  Curel Age Defying Therapeutic Moisturizing Lotion with Alpha Hydroxy  Curel Extreme Care Body Lotion  Curel Soothing Hands Moisturizing Hand Lotion  Curel Therapeutic Moisturizing Cream, Fragrance-Free  Curel Therapeutic Moisturizing Lotion, Fragrance-Free  Curel Therapeutic Moisturizing Lotion, Original Formula  Eucerin Daily Replenishing Lotion  Eucerin Dry Skin Therapy Plus Alpha Hydroxy Crme  Eucerin Dry Skin Therapy Plus Alpha Hydroxy Lotion  Eucerin Original Crme  Eucerin Original Lotion  Eucerin Plus Crme Eucerin Plus Lotion  Eucerin TriLipid Replenishing Lotion  Keri Anti-Bacterial Hand Lotion  Keri Deep Conditioning Original Lotion Dry Skin Formula Softly Scented  Keri Deep Conditioning Original Lotion, Fragrance Free Sensitive Skin Formula  Keri Lotion Fast Absorbing Fragrance Free Sensitive Skin Formula  Keri Lotion Fast Absorbing Softly Scented Dry Skin Formula  Keri Original Lotion  [employer]  Skin Renewal Lotion Keri Silky Smooth Lotion  Keri Silky Smooth Sensitive Skin Lotion  Nivea Body Creamy Conditioning Oil  Nivea Body Extra Enriched Lotion  Nivea Body Original Lotion  Nivea Body Sheer Moisturizing Lotion Nivea Crme  Nivea Skin Firming Lotion  NutraDerm 30 Skin Lotion  NutraDerm Skin Lotion  NutraDerm Therapeutic Skin Cream  NutraDerm Therapeutic Skin Lotion  ProShield Protective Hand Cream  Provon moisturizing lotion  Please read over the following fact sheets that you were given.

## 2024-08-04 NOTE — Progress Notes (Signed)
 PCP - Man X. Mast, NP Cardiologist - Dr Dorn Lesches (clearance on 07/11/24)  Chest x-ray - 09/09/23 EKG - 07/11/24 Stress Test - 07/18/24 ECHO - 09/10/23 Cardiac Cath - 11/17/18  ICD Pacemaker/Loop - n/a  Sleep Study -  n/a  Diabetes Type 2, pt does not check blood sugar  Hold Mounjaro  for 7 days prior to procedure.  Last dose will be on or before Thursday, 08/03/24.  Plavix  Instructions:  Last dose was on 08/04/24.  Aspirin  Instructions: n/a  ERAS - clear liquids til 10:30 AM DOS  Anesthesia review: Yes  STOP now taking any Aspirin  (unless otherwise instructed by your surgeon), Aleve , Naproxen , Ibuprofen , Motrin , Advil , Goody's, BC's, all herbal medications, fish oil, and all vitamins.   Coronavirus Screening Do you have any of the following symptoms:  Cough yes/no: No Fever (>100.28F)  yes/no: No Runny nose yes/no: No Sore throat yes/no: No Difficulty breathing/shortness of breath  yes/no: No  Have you traveled in the last 14 days and where? yes/no: No  Patient verbalized understanding of instructions that were given to them at the PAT appointment. Patient was also instructed that they will need to review over the PAT instructions again at home before surgery.

## 2024-08-07 ENCOUNTER — Other Ambulatory Visit: Payer: Self-pay | Admitting: Nurse Practitioner

## 2024-08-07 ENCOUNTER — Encounter (HOSPITAL_COMMUNITY)
Admission: RE | Admit: 2024-08-07 | Discharge: 2024-08-07 | Disposition: A | Discharge status: Home / Self Care | Source: Ambulatory Visit | Attending: Neurological Surgery | Admitting: Neurological Surgery

## 2024-08-07 ENCOUNTER — Encounter (HOSPITAL_COMMUNITY): Payer: Self-pay

## 2024-08-07 ENCOUNTER — Other Ambulatory Visit: Payer: Self-pay

## 2024-08-07 VITALS — BP 129/69 | HR 93 | Temp 98.5°F | Resp 18 | Ht 65.0 in | Wt 161.6 lb

## 2024-08-07 DIAGNOSIS — Z951 Presence of aortocoronary bypass graft: Secondary | ICD-10-CM | POA: Diagnosis not present

## 2024-08-07 DIAGNOSIS — E119 Type 2 diabetes mellitus without complications: Secondary | ICD-10-CM | POA: Insufficient documentation

## 2024-08-07 DIAGNOSIS — I2510 Atherosclerotic heart disease of native coronary artery without angina pectoris: Secondary | ICD-10-CM | POA: Insufficient documentation

## 2024-08-07 DIAGNOSIS — Z01818 Encounter for other preprocedural examination: Secondary | ICD-10-CM

## 2024-08-07 DIAGNOSIS — I10 Essential (primary) hypertension: Secondary | ICD-10-CM | POA: Diagnosis not present

## 2024-08-07 DIAGNOSIS — J45909 Unspecified asthma, uncomplicated: Secondary | ICD-10-CM | POA: Insufficient documentation

## 2024-08-07 DIAGNOSIS — Z01812 Encounter for preprocedural laboratory examination: Secondary | ICD-10-CM | POA: Diagnosis present

## 2024-08-07 DIAGNOSIS — K449 Diaphragmatic hernia without obstruction or gangrene: Secondary | ICD-10-CM | POA: Insufficient documentation

## 2024-08-07 DIAGNOSIS — Z79899 Other long term (current) drug therapy: Secondary | ICD-10-CM | POA: Diagnosis not present

## 2024-08-07 DIAGNOSIS — E039 Hypothyroidism, unspecified: Secondary | ICD-10-CM | POA: Diagnosis not present

## 2024-08-07 DIAGNOSIS — K219 Gastro-esophageal reflux disease without esophagitis: Secondary | ICD-10-CM | POA: Insufficient documentation

## 2024-08-07 DIAGNOSIS — E785 Hyperlipidemia, unspecified: Secondary | ICD-10-CM | POA: Diagnosis not present

## 2024-08-07 HISTORY — DX: Hypothyroidism, unspecified: E03.9

## 2024-08-07 HISTORY — DX: Depression, unspecified: F32.A

## 2024-08-07 HISTORY — DX: Anxiety disorder, unspecified: F41.9

## 2024-08-07 HISTORY — DX: Type 2 diabetes mellitus without complications: E11.9

## 2024-08-07 LAB — BASIC METABOLIC PANEL WITH GFR
Anion gap: 10 (ref 5–15)
BUN: 24 mg/dL — ABNORMAL HIGH (ref 8–23)
CO2: 28 mmol/L (ref 22–32)
Calcium: 9.5 mg/dL (ref 8.9–10.3)
Chloride: 103 mmol/L (ref 98–111)
Creatinine, Ser: 0.81 mg/dL (ref 0.44–1.00)
GFR, Estimated: >60 mL/min
Glucose, Bld: 94 mg/dL (ref 70–99)
Potassium: 4.2 mmol/L (ref 3.5–5.1)
Sodium: 141 mmol/L (ref 135–145)

## 2024-08-07 LAB — SURGICAL PCR SCREEN
MRSA, PCR: NEGATIVE
Staphylococcus aureus: POSITIVE — AB

## 2024-08-07 LAB — CBC
HCT: 40.6 % (ref 36.0–46.0)
Hemoglobin: 13.4 g/dL (ref 12.0–15.0)
MCH: 29.9 pg (ref 26.0–34.0)
MCHC: 33 g/dL (ref 30.0–36.0)
MCV: 90.6 fL (ref 80.0–100.0)
Platelets: 227 K/uL (ref 150–400)
RBC: 4.48 MIL/uL (ref 3.87–5.11)
RDW: 13.8 % (ref 11.5–15.5)
WBC: 8.7 K/uL (ref 4.0–10.5)
nRBC: 0 % (ref 0.0–0.2)

## 2024-08-07 LAB — GLUCOSE, CAPILLARY: Glucose-Capillary: 80 mg/dL (ref 70–99)

## 2024-08-08 NOTE — Progress Notes (Signed)
 Anesthesia Chart Review:  84 year old female follows with cardiology for history of HTN, HLD, CAD s/p DES to LAD 2019 and subsequent CABG x3 2020 with LIMA-LAD, SVG-OM1, SVG-Ramus.  Seen by Dr. Court 07/11/2024 for preop evaluation.  Per note, Patient apparently has spinal stenosis and is symptomatic. She has seen Dr. Alm Molt for consideration of surgical correction of this. She denies chest pain. She did have a 2D echo performed 09/10/2023 that revealed EF of 50 to 55% without valvular abnormalities. I am going to get a Lexiscan  Myoview  to preoperatively cleared her.  Nuclear stress 07/18/2024 was low risk.   Other pertinent history includes non-insulin -dependent DM2, GERD on PPI, hiatal hernia, asthma on Symbicort and as needed albuterol , hypothyroidism.  Patient reports last dose Plavix  08/04/2024.  Last dose of Mounjaro  08/03/2024.  Preop labs reviewed, unremarkable.  EKG 07/11/24: NSR.  Rate 82.  Nonspecific ST and T wave abnormality.  Nuclear stress 07/18/24: 1. Left ventricular ejection fraction, by estimation, is 50 to 55%. The  left ventricle has low normal function. The left ventricle has no regional  wall motion abnormalities. There is mild concentric left ventricular  hypertrophy. Left ventricular  diastolic parameters are consistent with Grade I diastolic dysfunction  (impaired relaxation).   2. Right ventricular systolic function is normal. The right ventricular  size is normal. There is normal pulmonary artery systolic pressure. The  estimated right ventricular systolic pressure is 29.0 mmHg.   3. The mitral valve is grossly normal. Mild mitral valve regurgitation.   4. The aortic valve is tricuspid. Aortic valve regurgitation is not  visualized. No aortic stenosis is present. Aortic valve mean gradient  measures 3.0 mmHg.   5. The inferior vena cava is normal in size with greater than 50%  respiratory variability, suggesting right atrial pressure of 3 mmHg.   Comparison(s):  Prior images reviewed side by side. LVEF 50-55% range.  Normal estimated RVSP. Mild mitral regurgitation.   TTE 09/10/23: 1. Left ventricular ejection fraction, by estimation, is 50 to 55%. The  left ventricle has low normal function. The left ventricle has no regional  wall motion abnormalities. There is mild concentric left ventricular  hypertrophy. Left ventricular  diastolic parameters are consistent with Grade I diastolic dysfunction  (impaired relaxation).   2. Right ventricular systolic function is normal. The right ventricular  size is normal. There is normal pulmonary artery systolic pressure. The  estimated right ventricular systolic pressure is 29.0 mmHg.   3. The mitral valve is grossly normal. Mild mitral valve regurgitation.   4. The aortic valve is tricuspid. Aortic valve regurgitation is not  visualized. No aortic stenosis is present. Aortic valve mean gradient  measures 3.0 mmHg.   5. The inferior vena cava is normal in size with greater than 50%  respiratory variability, suggesting right atrial pressure of 3 mmHg.   Comparison(s): Prior images reviewed side by side. LVEF 50-55% range.  Normal estimated RVSP. Mild mitral regurgitation.     Lynwood Geofm RIGGERS [facility] Stay [facility]/Anesthesiology Phone [phone] 08/08/2024 4:33 PM

## 2024-08-08 NOTE — Anesthesia Preprocedure Evaluation (Addendum)
 "                                  Anesthesia Evaluation  Patient identified by MRN, date of birth, ID band Patient awake    Reviewed: Allergy & Precautions, H&P , NPO status , Patient's Chart, lab work & pertinent test results  Airway Mallampati: II  TM Distance: >3 FB Neck ROM: Full    Dental no notable dental hx. (+) Loose,    Pulmonary asthma , pneumonia   Pulmonary exam normal breath sounds clear to auscultation       Cardiovascular hypertension, Pt. on medications + angina with exertion + CAD  Normal cardiovascular exam Rhythm:Regular Rate:Normal     Neuro/Psych  PSYCHIATRIC DISORDERS Anxiety Depression     Neuromuscular disease    GI/Hepatic Neg liver ROS, hiatal hernia,GERD  Medicated,,  Endo/Other  diabetesHypothyroidism    Renal/GU Renal disease  negative genitourinary   Musculoskeletal  (+) Arthritis ,    Abdominal   Peds negative pediatric ROS (+)  Hematology  (+) Blood dyscrasia, anemia   Anesthesia Other Findings   Reproductive/Obstetrics negative OB ROS                              Anesthesia Physical Anesthesia Plan  ASA: 3  Anesthesia Plan: General   Post-op Pain Management: Dilaudid  IV and Ketamine  IV*   Induction: Intravenous  PONV Risk Score and Plan: 3 and Ondansetron  and Dexamethasone   Airway Management Planned: Oral ETT  Additional Equipment: None  Intra-op Plan:   Post-operative Plan: Extubation in OR  Informed Consent:   Plan Discussed with: Anesthesiologist and CRNA  Anesthesia Plan Comments: (PAT note by Lynwood Hope, PA-C: 84 year old female follows with cardiology for history of HTN, HLD, CAD s/p DES to LAD 2019 and subsequent CABG x3 2020 with LIMA-LAD, SVG-OM1, SVG-Ramus.  Seen by Dr. Court 07/11/2024 for preop evaluation.  Per note, Patient apparently has spinal stenosis and is symptomatic. She has seen Dr. Alm Molt for consideration of surgical correction of this. She  denies chest pain. She did have a 2D echo performed 09/10/2023 that revealed EF of 50 to 55% without valvular abnormalities. I am going to get a Lexiscan  Myoview  to preoperatively cleared her.  Nuclear stress 07/18/2024 was low risk.   Other pertinent history includes non-insulin -dependent DM2, GERD on PPI, hiatal hernia, asthma on Symbicort and as needed albuterol , hypothyroidism.  Patient reports last dose Plavix  08/04/2024.  Last dose of Mounjaro  08/03/2024.  Preop labs reviewed, unremarkable.  EKG 07/11/24: NSR.  Rate 82.  Nonspecific ST and T wave abnormality.  Nuclear stress 07/18/24: 1. Left ventricular ejection fraction, by estimation, is 50 to 55%. The  left ventricle has low normal function. The left ventricle has no regional  wall motion abnormalities. There is mild concentric left ventricular  hypertrophy. Left ventricular  diastolic parameters are consistent with Grade I diastolic dysfunction  (impaired relaxation).  2. Right ventricular systolic function is normal. The right ventricular  size is normal. There is normal pulmonary artery systolic pressure. The  estimated right ventricular systolic pressure is 29.0 mmHg.  3. The mitral valve is grossly normal. Mild mitral valve regurgitation.  4. The aortic valve is tricuspid. Aortic valve regurgitation is not  visualized. No aortic stenosis is present. Aortic valve mean gradient  measures 3.0 mmHg.  5. The inferior vena cava is normal  in size with greater than 50%  respiratory variability, suggesting right atrial pressure of 3 mmHg.   Comparison(s): Prior images reviewed side by side. LVEF 50-55% range.  Normal estimated RVSP. Mild mitral regurgitation.   TTE 09/10/23: 1. Left ventricular ejection fraction, by estimation, is 50 to 55%. The  left ventricle has low normal function. The left ventricle has no regional  wall motion abnormalities. There is mild concentric left ventricular  hypertrophy. Left ventricular  diastolic  parameters are consistent with Grade I diastolic dysfunction  (impaired relaxation).  2. Right ventricular systolic function is normal. The right ventricular  size is normal. There is normal pulmonary artery systolic pressure. The  estimated right ventricular systolic pressure is 29.0 mmHg.  3. The mitral valve is grossly normal. Mild mitral valve regurgitation.  4. The aortic valve is tricuspid. Aortic valve regurgitation is not  visualized. No aortic stenosis is present. Aortic valve mean gradient  measures 3.0 mmHg.  5. The inferior vena cava is normal in size with greater than 50%  respiratory variability, suggesting right atrial pressure of 3 mmHg.   Comparison(s): Prior images reviewed side by side. LVEF 50-55% range.  Normal estimated RVSP. Mild mitral regurgitation.   )         Anesthesia Quick Evaluation  "

## 2024-08-11 NOTE — Progress Notes (Signed)
 Patient updated on new arrival time and ERAS instructions.  Patient stated that she did not have any further questions.

## 2024-08-14 ENCOUNTER — Other Ambulatory Visit: Payer: Self-pay

## 2024-08-14 ENCOUNTER — Ambulatory Visit (HOSPITAL_BASED_OUTPATIENT_CLINIC_OR_DEPARTMENT_OTHER): Payer: Self-pay | Admitting: Anesthesiology

## 2024-08-14 ENCOUNTER — Encounter (HOSPITAL_COMMUNITY): Payer: Self-pay | Admitting: Neurological Surgery

## 2024-08-14 ENCOUNTER — Encounter (HOSPITAL_COMMUNITY): Admission: RE | Disposition: A | Payer: Self-pay | Source: Home / Self Care | Attending: Neurological Surgery

## 2024-08-14 ENCOUNTER — Ambulatory Visit (HOSPITAL_COMMUNITY)

## 2024-08-14 ENCOUNTER — Encounter (HOSPITAL_COMMUNITY): Payer: Self-pay | Admitting: Physician Assistant

## 2024-08-14 ENCOUNTER — Observation Stay (HOSPITAL_COMMUNITY)
Admission: RE | Admit: 2024-08-14 | Discharge: 2024-08-15 | Disposition: A | Discharge status: Home / Self Care | Attending: Neurological Surgery | Admitting: Neurological Surgery

## 2024-08-14 DIAGNOSIS — Z9889 Other specified postprocedural states: Principal | ICD-10-CM

## 2024-08-14 DIAGNOSIS — E039 Hypothyroidism, unspecified: Secondary | ICD-10-CM | POA: Diagnosis not present

## 2024-08-14 DIAGNOSIS — M5442 Lumbago with sciatica, left side: Secondary | ICD-10-CM | POA: Diagnosis present

## 2024-08-14 DIAGNOSIS — M48061 Spinal stenosis, lumbar region without neurogenic claudication: Secondary | ICD-10-CM | POA: Diagnosis not present

## 2024-08-14 DIAGNOSIS — I10 Essential (primary) hypertension: Secondary | ICD-10-CM

## 2024-08-14 DIAGNOSIS — Z79899 Other long term (current) drug therapy: Secondary | ICD-10-CM | POA: Insufficient documentation

## 2024-08-14 DIAGNOSIS — E119 Type 2 diabetes mellitus without complications: Secondary | ICD-10-CM | POA: Insufficient documentation

## 2024-08-14 DIAGNOSIS — Z96643 Presence of artificial hip joint, bilateral: Secondary | ICD-10-CM | POA: Insufficient documentation

## 2024-08-14 DIAGNOSIS — J45909 Unspecified asthma, uncomplicated: Secondary | ICD-10-CM | POA: Diagnosis not present

## 2024-08-14 DIAGNOSIS — Z7985 Long-term (current) use of injectable non-insulin antidiabetic drugs: Secondary | ICD-10-CM | POA: Insufficient documentation

## 2024-08-14 DIAGNOSIS — I25119 Atherosclerotic heart disease of native coronary artery with unspecified angina pectoris: Secondary | ICD-10-CM | POA: Diagnosis not present

## 2024-08-14 DIAGNOSIS — M4316 Spondylolisthesis, lumbar region: Secondary | ICD-10-CM | POA: Diagnosis not present

## 2024-08-14 HISTORY — PX: LUMBAR LAMINECTOMY/DECOMPRESSION MICRODISCECTOMY: SHX5026

## 2024-08-14 LAB — GLUCOSE, CAPILLARY
Glucose-Capillary: 114 mg/dL — ABNORMAL HIGH (ref 70–99)
Glucose-Capillary: 107 mg/dL — ABNORMAL HIGH (ref 70–99)

## 2024-08-14 MED ORDER — CARVEDILOL 12.5 MG PO TABS
12.5000 mg | ORAL_TABLET | Freq: Two times a day (BID) | ORAL | Status: DC
  Administered 2024-08-14 – 2024-08-15 (×2): 12.5 mg via ORAL
  Filled 2024-08-14 (×2): qty 1

## 2024-08-14 MED ORDER — CHLORHEXIDINE GLUCONATE 0.12 % MT SOLN
OROMUCOSAL | Status: AC
  Administered 2024-08-14: 15 mL via OROMUCOSAL
  Filled 2024-08-14: qty 15

## 2024-08-14 MED ORDER — PANTOPRAZOLE SODIUM 40 MG PO TBEC
40.0000 mg | DELAYED_RELEASE_TABLET | Freq: Two times a day (BID) | ORAL | Status: DC
  Administered 2024-08-14 – 2024-08-15 (×3): 40 mg via ORAL
  Filled 2024-08-14 (×3): qty 1

## 2024-08-14 MED ORDER — SODIUM CHLORIDE 0.9% FLUSH: 3.0000 mL | INTRAVENOUS | Status: DC | PRN

## 2024-08-14 MED ORDER — PHENOL 1.4 % MT LIQD: 1.0000 | OROMUCOSAL | Status: DC | PRN

## 2024-08-14 MED ORDER — SENNA 8.6 MG PO TABS
1.0000 | ORAL_TABLET | Freq: Two times a day (BID) | ORAL | Status: DC
  Administered 2024-08-14 – 2024-08-15 (×2): 8.6 mg via ORAL
  Filled 2024-08-14 (×2): qty 1

## 2024-08-14 MED ORDER — FENTANYL CITRATE (PF) 250 MCG/5ML IJ SOLN
INTRAMUSCULAR | Status: AC
  Filled 2024-08-14: qty 5

## 2024-08-14 MED ORDER — ONDANSETRON HCL 4 MG/2ML IJ SOLN
INTRAMUSCULAR | Status: DC | PRN
  Administered 2024-08-14: 4 mg via INTRAVENOUS

## 2024-08-14 MED ORDER — ALBUTEROL SULFATE HFA 108 (90 BASE) MCG/ACT IN AERS: 2.0000 | INHALATION_SPRAY | Freq: Four times a day (QID) | RESPIRATORY_TRACT | Status: DC | PRN

## 2024-08-14 MED ORDER — CHLORHEXIDINE GLUCONATE CLOTH 2 % EX PADS: 6.0000 | MEDICATED_PAD | Freq: Once | CUTANEOUS | Status: DC

## 2024-08-14 MED ORDER — DEXAMETHASONE SOD PHOSPHATE PF 10 MG/ML IJ SOLN
INTRAMUSCULAR | Status: AC
  Filled 2024-08-14: qty 1

## 2024-08-14 MED ORDER — PROPOFOL 10 MG/ML IV BOLUS
INTRAVENOUS | Status: AC
  Filled 2024-08-14: qty 20

## 2024-08-14 MED ORDER — ORAL CARE MOUTH RINSE: 15.0000 mL | Freq: Once | OROMUCOSAL | Status: AC

## 2024-08-14 MED ORDER — LACTATED RINGERS IV SOLN: INTRAVENOUS | Status: DC

## 2024-08-14 MED ORDER — 0.9 % SODIUM CHLORIDE (POUR BTL) OPTIME
TOPICAL | Status: DC | PRN
  Administered 2024-08-14: 1000 mL

## 2024-08-14 MED ORDER — PREGABALIN 25 MG PO CAPS
25.0000 mg | ORAL_CAPSULE | Freq: Every day | ORAL | Status: DC
  Administered 2024-08-15: 25 mg via ORAL
  Filled 2024-08-14: qty 1

## 2024-08-14 MED ORDER — ACETAMINOPHEN 650 MG RE SUPP: 650.0000 mg | RECTAL | Status: DC | PRN

## 2024-08-14 MED ORDER — LISINOPRIL 20 MG PO TABS
40.0000 mg | ORAL_TABLET | Freq: Every day | ORAL | Status: DC
  Administered 2024-08-15: 40 mg via ORAL
  Filled 2024-08-14 (×2): qty 2

## 2024-08-14 MED ORDER — INSULIN ASPART 100 UNIT/ML IJ SOLN: 0.0000 [IU] | INTRAMUSCULAR | Status: DC | PRN

## 2024-08-14 MED ORDER — FENTANYL CITRATE (PF) 250 MCG/5ML IJ SOLN
INTRAMUSCULAR | Status: DC | PRN
  Administered 2024-08-14 (×2): 50 ug via INTRAVENOUS

## 2024-08-14 MED ORDER — HYDROMORPHONE HCL 1 MG/ML IJ SOLN: 0.5000 mg | INTRAMUSCULAR | Status: DC | PRN

## 2024-08-14 MED ORDER — SUGAMMADEX SODIUM 200 MG/2ML IV SOLN
INTRAVENOUS | Status: DC | PRN
  Administered 2024-08-14: 200 mg via INTRAVENOUS

## 2024-08-14 MED ORDER — ACETAMINOPHEN 500 MG PO TABS: 1000.0000 mg | ORAL_TABLET | Freq: Once | ORAL | Status: DC

## 2024-08-14 MED ORDER — PROPOFOL 10 MG/ML IV BOLUS
INTRAVENOUS | Status: DC | PRN
  Administered 2024-08-14: 150 mg via INTRAVENOUS

## 2024-08-14 MED ORDER — FENTANYL CITRATE (PF) 100 MCG/2ML IJ SOLN
INTRAMUSCULAR | Status: AC
  Filled 2024-08-14: qty 2

## 2024-08-14 MED ORDER — ONDANSETRON HCL 4 MG/2ML IJ SOLN
INTRAMUSCULAR | Status: AC
  Filled 2024-08-14: qty 2

## 2024-08-14 MED ORDER — OXYCODONE HCL 5 MG/5ML PO SOLN
5.0000 mg | Freq: Once | ORAL | Status: AC | PRN
  Administered 2024-08-14: 5 mg via ORAL

## 2024-08-14 MED ORDER — ROCURONIUM BROMIDE 10 MG/ML (PF) SYRINGE
PREFILLED_SYRINGE | INTRAVENOUS | Status: AC
  Filled 2024-08-14: qty 10

## 2024-08-14 MED ORDER — BUPIVACAINE HCL (PF) 0.25 % IJ SOLN
INTRAMUSCULAR | Status: AC
  Filled 2024-08-14: qty 30

## 2024-08-14 MED ORDER — CHLORHEXIDINE GLUCONATE 0.12 % MT SOLN: 15.0000 mL | Freq: Once | OROMUCOSAL | Status: AC

## 2024-08-14 MED ORDER — DEXAMETHASONE SOD PHOSPHATE PF 10 MG/ML IJ SOLN
INTRAMUSCULAR | Status: DC | PRN
  Administered 2024-08-14: 5 mg via INTRAVENOUS

## 2024-08-14 MED ORDER — FENTANYL CITRATE (PF) 100 MCG/2ML IJ SOLN
25.0000 ug | INTRAMUSCULAR | Status: DC | PRN
  Administered 2024-08-14: 25 ug via INTRAVENOUS

## 2024-08-14 MED ORDER — HYDROCODONE-ACETAMINOPHEN 7.5-325 MG PO TABS
1.0000 | ORAL_TABLET | Freq: Four times a day (QID) | ORAL | Status: DC
  Administered 2024-08-14 – 2024-08-15 (×5): 1 via ORAL
  Filled 2024-08-14 (×5): qty 1

## 2024-08-14 MED ORDER — ROCURONIUM BROMIDE 10 MG/ML (PF) SYRINGE
PREFILLED_SYRINGE | INTRAVENOUS | Status: DC | PRN
  Administered 2024-08-14: 60 mg via INTRAVENOUS

## 2024-08-14 MED ORDER — CHLORHEXIDINE GLUCONATE 4 % EX SOLN: 1.0000 | CUTANEOUS | 1 refills | Status: DC

## 2024-08-14 MED ORDER — GABAPENTIN 300 MG PO CAPS
300.0000 mg | ORAL_CAPSULE | ORAL | Status: AC
  Administered 2024-08-14: 300 mg via ORAL
  Filled 2024-08-14: qty 1

## 2024-08-14 MED ORDER — CEFAZOLIN SODIUM-DEXTROSE 2-4 GM/100ML-% IV SOLN
2.0000 g | INTRAVENOUS | Status: AC
  Administered 2024-08-14: 2 g via INTRAVENOUS
  Filled 2024-08-14: qty 100

## 2024-08-14 MED ORDER — THROMBIN 5000 UNITS EX KIT
PACK | CUTANEOUS | Status: AC
  Filled 2024-08-14: qty 2

## 2024-08-14 MED ORDER — MENTHOL 3 MG MT LOZG: 1.0000 | LOZENGE | OROMUCOSAL | Status: DC | PRN

## 2024-08-14 MED ORDER — LEVOTHYROXINE SODIUM 25 MCG PO TABS
25.0000 ug | ORAL_TABLET | Freq: Every day | ORAL | Status: DC
  Administered 2024-08-15: 25 ug via ORAL
  Filled 2024-08-14: qty 1

## 2024-08-14 MED ORDER — THROMBIN 5000 UNITS EX SOLR
OROMUCOSAL | Status: DC | PRN
  Administered 2024-08-14: 5 mL via TOPICAL

## 2024-08-14 MED ORDER — DULOXETINE HCL 30 MG PO CPEP
30.0000 mg | ORAL_CAPSULE | Freq: Every day | ORAL | Status: DC
  Administered 2024-08-14: 30 mg via ORAL
  Filled 2024-08-14: qty 1

## 2024-08-14 MED ORDER — OXYCODONE HCL 5 MG PO TABS: 5.0000 mg | ORAL_TABLET | Freq: Once | ORAL | Status: AC | PRN

## 2024-08-14 MED ORDER — LIDOCAINE 2% (20 MG/ML) 5 ML SYRINGE
INTRAMUSCULAR | Status: DC | PRN
  Administered 2024-08-14: 100 mg via INTRAVENOUS

## 2024-08-14 MED ORDER — AMLODIPINE BESYLATE 5 MG PO TABS
5.0000 mg | ORAL_TABLET | Freq: Every day | ORAL | Status: DC
  Administered 2024-08-14 – 2024-08-15 (×2): 5 mg via ORAL
  Filled 2024-08-14 (×2): qty 1

## 2024-08-14 MED ORDER — MUPIROCIN 2 % EX OINT: 1.0000 | TOPICAL_OINTMENT | Freq: Two times a day (BID) | CUTANEOUS | 0 refills | Status: DC

## 2024-08-14 MED ORDER — THROMBIN 5000 UNITS EX KIT
PACK | CUTANEOUS | Status: AC
  Filled 2024-08-14: qty 1

## 2024-08-14 MED ORDER — LIDOCAINE 2% (20 MG/ML) 5 ML SYRINGE
INTRAMUSCULAR | Status: AC
  Filled 2024-08-14: qty 5

## 2024-08-14 MED ORDER — ACETAMINOPHEN 500 MG PO TABS
1000.0000 mg | ORAL_TABLET | ORAL | Status: DC
  Filled 2024-08-14: qty 2

## 2024-08-14 MED ORDER — SODIUM CHLORIDE 0.9% FLUSH
3.0000 mL | Freq: Two times a day (BID) | INTRAVENOUS | Status: DC
  Administered 2024-08-14 (×2): 3 mL via INTRAVENOUS

## 2024-08-14 MED ORDER — POTASSIUM CHLORIDE IN NACL 20-0.9 MEQ/L-% IV SOLN
INTRAVENOUS | Status: DC
  Filled 2024-08-14 (×2): qty 1000

## 2024-08-14 MED ORDER — BUPIVACAINE HCL (PF) 0.25 % IJ SOLN
INTRAMUSCULAR | Status: DC | PRN
  Administered 2024-08-14: 7 mL

## 2024-08-14 MED ORDER — CELECOXIB 200 MG PO CAPS
200.0000 mg | ORAL_CAPSULE | Freq: Once | ORAL | Status: AC
  Administered 2024-08-14: 200 mg via ORAL
  Filled 2024-08-14: qty 1

## 2024-08-14 MED ORDER — FLUTICASONE FUROATE-VILANTEROL 100-25 MCG/ACT IN AEPB
1.0000 | INHALATION_SPRAY | Freq: Every day | RESPIRATORY_TRACT | Status: DC
  Administered 2024-08-15: 1 via RESPIRATORY_TRACT
  Filled 2024-08-14 (×2): qty 28

## 2024-08-14 MED ORDER — PHENYLEPHRINE HCL-NACL 20-0.9 MG/250ML-% IV SOLN
INTRAVENOUS | Status: DC | PRN
  Administered 2024-08-14: 20 ug/min via INTRAVENOUS

## 2024-08-14 MED ORDER — ADULT MULTIVITAMIN W/MINERALS CH
1.0000 | ORAL_TABLET | Freq: Every day | ORAL | Status: DC
  Administered 2024-08-14 – 2024-08-15 (×2): 1 via ORAL
  Filled 2024-08-14 (×2): qty 1

## 2024-08-14 MED ORDER — ONDANSETRON HCL 4 MG/2ML IJ SOLN: 4.0000 mg | Freq: Once | INTRAMUSCULAR | Status: DC | PRN

## 2024-08-14 MED ORDER — OXYCODONE HCL 5 MG/5ML PO SOLN
ORAL | Status: AC
  Filled 2024-08-14: qty 5

## 2024-08-14 MED ORDER — ONDANSETRON HCL 4 MG/2ML IJ SOLN: 4.0000 mg | Freq: Four times a day (QID) | INTRAMUSCULAR | Status: DC | PRN

## 2024-08-14 MED ORDER — ACETAMINOPHEN 325 MG PO TABS: 650.0000 mg | ORAL_TABLET | ORAL | Status: DC | PRN

## 2024-08-14 MED ORDER — PHENYLEPHRINE 80 MCG/ML (10ML) SYRINGE FOR IV PUSH (FOR BLOOD PRESSURE SUPPORT)
PREFILLED_SYRINGE | INTRAVENOUS | Status: AC
  Filled 2024-08-14: qty 10

## 2024-08-14 MED ORDER — CEFAZOLIN SODIUM-DEXTROSE 2-4 GM/100ML-% IV SOLN
2.0000 g | Freq: Three times a day (TID) | INTRAVENOUS | Status: AC
  Administered 2024-08-14 – 2024-08-15 (×2): 2 g via INTRAVENOUS
  Filled 2024-08-14 (×2): qty 100

## 2024-08-14 MED ORDER — ONDANSETRON HCL 4 MG PO TABS: 4.0000 mg | ORAL_TABLET | Freq: Four times a day (QID) | ORAL | Status: DC | PRN

## 2024-08-14 MED ORDER — MEPERIDINE HCL 25 MG/ML IJ SOLN: 6.2500 mg | INTRAMUSCULAR | Status: DC | PRN

## 2024-08-14 MED ORDER — SODIUM CHLORIDE 0.9 % IV SOLN: 250.0000 mL | INTRAVENOUS | Status: AC

## 2024-08-14 NOTE — Plan of Care (Signed)
" °  Problem: Education: Goal: Knowledge of General Education information will improve Description: Including pain rating scale, medication(s)/side effects and non-pharmacologic comfort measures Outcome: Progressing   Problem: Health Behavior/Discharge Planning: Goal: Ability to manage health-related needs will improve Outcome: Progressing   Problem: Clinical Measurements: Goal: Ability to maintain clinical measurements within normal limits will improve Outcome: Progressing Goal: Will remain free from infection Outcome: Progressing Goal: Diagnostic test results will improve Outcome: Progressing Goal: Respiratory complications will improve Outcome: Progressing Goal: Cardiovascular complication will be avoided Outcome: Progressing   Problem: Pain Managment: Goal: General experience of comfort will improve and/or be controlled Outcome: Progressing   Problem: Education: Goal: Knowledge of the prescribed therapeutic regimen will improve Outcome: Progressing   Problem: Urinary Elimination: Goal: Will remain free from infection Outcome: Progressing Goal: Ability to achieve and maintain adequate urine output Outcome: Progressing   "

## 2024-08-14 NOTE — Anesthesia Postprocedure Evaluation (Signed)
"   Anesthesia Post Note  Patient: ZIARE ORRICK  Procedure(s) Performed: LUMBAR LAMINECTOMY/DECOMPRESSION MICRODISCECTOMY 1 LEVEL (Left: Back)     Patient location during evaluation: PACU Anesthesia Type: General Level of consciousness: awake and alert Pain management: pain level controlled Vital Signs Assessment: post-procedure vital signs reviewed and stable Respiratory status: spontaneous breathing, nonlabored ventilation, respiratory function stable and patient connected to nasal cannula oxygen  Cardiovascular status: blood pressure returned to baseline and stable Postop Assessment: no apparent nausea or vomiting Anesthetic complications: no   No notable events documented.  Last Vitals:  Vitals:   08/14/24 1315 08/14/24 1353  BP: 122/60 138/66  Pulse: 68 82  Resp: 16 20  Temp:  37.1 C  SpO2: 100% 95%    Last Pain:  Vitals:   08/14/24 1404  TempSrc:   PainSc: 2                  [name]      "

## 2024-08-14 NOTE — H&P (Signed)
 Subjective: Patient is a 84 y.o. female admitted for Left leg pain. Onset of symptoms was several months ago, gradually worsening since that time.  The pain is rated severe, and is located at the across the lower back and radiates to LLE. The pain is described as aching and occurs all day. The symptoms have been progressive. Symptoms are exacerbated by exercise, standing, and walking for more than a few minutes. MRI or CT showed spondylolisthesis with stenosis L4-5.   Past Medical History:  Diagnosis Date   Anemia     mild as per PCP   Anginal pain    06/20/20, relieved , no current problems   Anxiety    Arthritis    hip/ s/p recent shoulder fracture 8/12- RIGHT   Asthma    Coronary artery disease 05/30/2018   LAD PCI/DES   Depression    Diabetes mellitus without complication (HCC)    type 2, does not check blood sugar, tx Mounjaro    GI bleeding    Hiatal hernia    no current problems   History of kidney stones    surgery to remove   Humerus fracture    with wrist on right side   Hypertension    hypercholesterolemia/  EKG on chart with clearance and note 06/18/11  Mazzocchi   Hypothyroidism    Pneumonia    x several   Pre-diabetes    hx    Past Surgical History:  Procedure Laterality Date   ABDOMINAL HYSTERECTOMY     cataract eye surgery     removed cataracts   COLECTOMY WITH COLOSTOMY CREATION/HARTMANN PROCEDURE N/A 05/07/2016   Procedure: COLOSTOMY CREATION/HARTMANN PROCEDURE;  Surgeon: Bernarda Ned, MD;  Location: [facility];  Service: General;  Laterality: N/A;   COLONOSCOPY N/A 08/26/2016   Procedure: COLONOSCOPY;  Surgeon: Bernarda Ned, MD;  Location: [facility];  Service: Endoscopy;  Laterality: N/A;   COLOSTOMY TAKEDOWN N/A 08/27/2016   Procedure: LAPAROSCOPIC COLOSTOMY REVERSAL;  Surgeon: Bernarda Ned, MD;  Location: [facility];  Service: General;  Laterality: N/A;   CORONARY ARTERY BYPASS GRAFT N/A 11/21/2018   Procedure: CORONARY ARTERY BYPASS GRAFTING (CABG) x  Three , using left internal mammary artery and right leg greater saphenous vein harvested endoscopically;  Surgeon: Lucas Dorise POUR, MD;  Location: [facility] OR;  Service: Open Heart Surgery;  Laterality: N/A;   CORONARY STENT INTERVENTION N/A 05/30/2018   Procedure: CORONARY STENT INTERVENTION;  Surgeon: Court Dorn PARAS, MD;  Location: [facility] INVAS[facility] CV LAB;  Service: Cardiovascular;  Laterality: N/A;   CYSTOSCOPY WITH RETROGRADE PYELOGRAM, URETEROSCOPY AND STENT PLACEMENT Bilateral 06/21/2020   Procedure: CYSTOSCOPY WITH RETROGRADE PYELOGRAM, AND BILATERAL STENT PLACEMENT;  Surgeon: Alvaro Hummer, MD;  Location: [facility];  Service: Urology;  Laterality: Bilateral;   CYSTOSCOPY WITH RETROGRADE PYELOGRAM, URETEROSCOPY AND STENT PLACEMENT Bilateral 07/12/2020   Procedure: CYSTOSCOPY WITH RETROGRADE PYELOGRAM, URETEROSCOPY AND STENT PLACEMENT;  Surgeon: Cam Morene ORN, MD;  Location: [facility];  Service: Urology;  Laterality: Bilateral;  2 HRS   EYE SURGERY     eyelid tuck bilateral   HOLMIUM LASER APPLICATION Bilateral 07/12/2020   Procedure: HOLMIUM LASER APPLICATION;  Surgeon: Cam Morene ORN, MD;  Location: [facility];  Service: Urology;  Laterality: Bilateral;   LAPAROTOMY N/A 05/07/2016   Procedure: EXPLORATORY LAPAROTOMY, LYLSIS OF ADHSIONS, FLO OF PERITONEAL ABSCESS;  Surgeon: Bernarda Ned, MD;  Location: [facility];  Service: General;  Laterality: N/A;   LEFT HEART CATH AND CORONARY ANGIOGRAPHY N/A 05/30/2018   Procedure: LEFT HEART CATH  AND CORONARY ANGIOGRAPHY;  Surgeon: Court Dorn PARAS, MD;  Location: [facility] INVAS[facility] CV LAB;  Service: Cardiovascular;  Laterality: N/A;   LEFT HEART CATH AND CORONARY ANGIOGRAPHY N/A 11/17/2018   Procedure: LEFT HEART CATH AND CORONARY ANGIOGRAPHY;  Surgeon: Court Dorn PARAS, MD;  Location: [facility];  Service: Cardiovascular;  Laterality: N/A;   PARATHYROIDECTOMY     one lobe- benign per pt   TEE WITHOUT CARDIOVERSION N/A 11/21/2018   Procedure:  TRANSESOPHAGEAL ECHOCARDIOGRAM (TEE);  Surgeon: Lucas Dorise POUR, MD;  Location: [facility] OR;  Service: Open Heart Surgery;  Laterality: N/A;   TONSILLECTOMY     TOTAL HIP ARTHROPLASTY  07/08/2011   Procedure: TOTAL HIP ARTHROPLASTY;  Surgeon: Dempsey GAILS Aluisio;  Location: [facility];  Service: Orthopedics;  Laterality: Right;   TOTAL KNEE ARTHROPLASTY Left 02/16/2022   Procedure: TOTAL KNEE ARTHROPLASTY;  Surgeon: Melodi Dempsey, MD;  Location: [facility];  Service: Orthopedics;  Laterality: Left;    Prior to Admission medications  Medication Sig Start Date End Date Taking? Authorizing Provider  albuterol  (VENTOLIN  HFA) 108 (90 Base) MCG/ACT inhaler Inhale 2 puffs into the lungs every 6 (six) hours as needed for wheezing or shortness of breath. 08/03/23  Yes Sherlynn Madden, MD  amLODipine  (NORVASC ) 5 MG tablet TAKE 1 TABLET BY MOUTH DAILY 07/31/24  Yes Mast, Man X, NP  atorvastatin  (LIPITOR ) 80 MG tablet Take 1 tablet (80 mg total) by mouth daily. 01/20/24  Yes Meng, Hao, PA  carvedilol  (COREG ) 12.5 MG tablet TAKE 1 TABLET BY MOUTH 2 TIMES A DAY 04/26/24  Yes Court Dorn PARAS, MD  cetirizine (ZYRTEC) 10 MG tablet Take 10 mg by mouth in the morning.   Yes [provider]  clopidogrel  (PLAVIX ) 75 MG tablet Take 1 tablet (75 mg total) by mouth daily. 12/27/23  Yes Ladona Heinz, MD  DULoxetine  (CYMBALTA ) 30 MG capsule Take 30 mg by mouth at bedtime.   Yes [provider]  furosemide  (LASIX ) 20 MG tablet Take 1 tablet (20 mg total) by mouth as needed. Patient taking differently: Take 20 mg by mouth daily as needed for fluid or edema. 09/20/23 08/01/24 Yes Meng, Hao, PA  ipratropium (ATROVENT) 0.03 % nasal spray Place 2 sprays into both nostrils 3 (three) times daily as needed for rhinitis. 09/17/21  Yes [provider]  levothyroxine  (SYNTHROID ) 25 MCG tablet TAKE 1 TABLET BY MOUTH DAILY 04/17/24  Yes Mast, Man X, NP  Multiple Vitamins-Minerals (MULTIVITAMIN WOMEN 50+ PO) Take 2 tablets by  mouth daily.   Yes [provider]  naproxen  sodium (ALEVE ) 220 MG tablet Take 220 mg by mouth 2 (two) times daily.   Yes [provider]  nitroGLYCERIN  (NITROSTAT ) 0.4 MG SL tablet Place 1 tablet (0.4 mg total) under the tongue as needed. 01/08/22  Yes Meng, Scot, PA  OVER THE COUNTER MEDICATION Apply 1 Application topically daily as needed (pain). Magna Sport Balm   Yes [provider]  pantoprazole  (PROTONIX ) 40 MG tablet Take 1 tablet (40 mg total) by mouth 2 (two) times daily before a meal. 05/18/16  Yes Simaan, Almarie RAMAN, PA-C  pregabalin  (LYRICA ) 25 MG capsule Take 1 capsule (25 mg total) by mouth daily. 07/14/24  Yes Medina-Vargas, Monina C, NP  Simethicone  (PHAZYME ULTIMATE) 500 MG CAPS Take 500 mg by mouth daily as needed (bloating).   Yes [provider]  Soft Lens Products (BIOTRUE) SOLN Place 1 drop into both eyes as needed (dry eyes).   Yes [provider]  tirzepatide  (MOUNJARO ) 15 MG/0.5ML Pen Inject 15 mg into the skin once a week. 06/22/24  Yes Court Dorn PARAS, MD  budesonide-formoterol  [facility]) 80-4.5 MCG/ACT inhaler Inhale 2 puffs into the lungs in the morning and at bedtime. Also uses as needed for emergency purposes Patient not taking: Reported on 08/01/2024 07/23/21   [provider]  lisinopril  (ZESTRIL ) 40 MG tablet TAKE 1 TABLET BY MOUTH DAILY 08/08/24   Mast, Man X, NP   Allergies[1]  Social History   Tobacco Use   Smoking status: Never   Smokeless tobacco: Never  Substance Use Topics   Alcohol use: Yes    Comment: rare - 2 x year    Family History  Problem Relation Age of Onset   CAD Father 45       PCI   Liver disease Neg Hx    Esophageal cancer Neg Hx    Colon cancer Neg Hx      Review of Systems  Positive ROS: neg  All other systems have been reviewed and were otherwise negative with the exception of those mentioned in the HPI and as above.  Objective: Vital signs in last 24 hours:     General Appearance: Alert, cooperative, no distress, appears stated age Head: Normocephalic, without obvious abnormality, atraumatic Eyes: PERRL, conjunctiva/corneas clear, EOM's intact    Neck: Supple, symmetrical, trachea midline Back: Symmetric, no curvature, ROM normal, no CVA tenderness Lungs:  respirations unlabored Heart: Regular rate and rhythm Abdomen: Soft, non-tender Extremities: Extremities normal, atraumatic, no cyanosis or edema Pulses: 2+ and symmetric all extremities Skin: Skin color, texture, turgor normal, no rashes or lesions  NEUROLOGIC:   Mental status: Alert and oriented x4,  no aphasia, good attention span, fund of knowledge, and memory Motor Exam - grossly normal Sensory Exam - grossly normal Reflexes: 1+ Coordination - grossly normal Gait - not tested Balance - not tested Cranial Nerves: I: smell Not tested  II: visual acuity  OS: nl    OD: nl  II: visual fields Full to confrontation  II: pupils Equal, round, reactive to light  III,VII: ptosis None  III,IV,VI: extraocular muscles  Full ROM  V: mastication Normal  V: facial light touch sensation  Normal  V,VII: corneal reflex  Present  VII: facial muscle function - upper  Normal  VII: facial muscle function - lower Normal  VIII: hearing Not tested  IX: soft palate elevation  Normal  IX,X: gag reflex Present  XI: trapezius strength  5/5  XI: sternocleidomastoid strength 5/5  XI: neck flexion strength  5/5  XII: tongue strength  Normal    Data Review Lab Results  Component Value Date   WBC 8.7 08/07/2024   HGB 13.4 08/07/2024   HCT 40.6 08/07/2024   MCV 90.6 08/07/2024   PLT 227 08/07/2024   Lab Results  Component Value Date   NA 141 08/07/2024   K 4.2 08/07/2024   CL 103 08/07/2024   CO2 28 08/07/2024   BUN 24 (H) 08/07/2024   CREATININE 0.81 08/07/2024   GLUCOSE 94 08/07/2024   Lab Results  Component Value Date   INR 1.7 (H) 11/21/2018    Assessment/Plan:  Estimated body  mass index is 26.89 kg/m as calculated from the following:   Height as of 08/07/24: 5' 5 (1.651 m).   Weight as of 08/07/24: 73.3 kg. Patient admitted for left L4-5 laminectomy with sublaminar decompression. Patient has failed a reasonable attempt at conservative therapy.  I explained the condition and procedure to  the patient and answered any questions.  Patient wishes to proceed with procedure as planned. Understands risks/ benefits and typical outcomes of procedure.   Alm GORMAN Molt 08/14/2024 7:36 AM      [1]  Allergies Allergen Reactions   Morphine  Other (See Comments)    Medication did not work for pain    Penicillin G Other (See Comments)    penicillin G   Clindamycin /Lincomycin    Penicillins Other (See Comments)    UNSPECIFIED REACTION  Unknown reaction as a child.  Tolerated Zosyn  2017. Tolerated Cephalosporin Date: 02/17/22.     Sulfamethoxazole-Trimethoprim

## 2024-08-14 NOTE — Anesthesia Postprocedure Evaluation (Signed)
"   Anesthesia Post Note  Patient: Monique [name]  Procedure(s) Performed: LUMBAR LAMINECTOMY/DECOMPRESSION MICRODISCECTOMY 1 LEVEL (Left: Back)     Patient location during evaluation: PACU Anesthesia Type: General Level of consciousness: awake and alert Pain management: pain level controlled Vital Signs Assessment: post-procedure vital signs reviewed and stable Respiratory status: spontaneous breathing, nonlabored ventilation, respiratory function stable and patient connected to nasal cannula oxygen  Cardiovascular status: blood pressure returned to baseline and stable Postop Assessment: no apparent nausea or vomiting Anesthetic complications: no   No notable events documented.  Last Vitals:  Vitals:   08/14/24 1315 08/14/24 1353  BP: 122/60 138/66  Pulse: 68 82  Resp: 16 20  Temp:  37.1 C  SpO2: 100% 95%    Last Pain:  Vitals:   08/14/24 1404  TempSrc:   PainSc: 2                  [name]      "

## 2024-08-14 NOTE — Anesthesia Procedure Notes (Signed)
 Procedure Name: Intubation Date/Time: 08/14/2024 10:36 AM  Performed by: Hedy Jarred, CRNAPre-anesthesia Checklist: Patient identified, Emergency Drugs available, Suction available and Patient being monitored Patient Re-evaluated:Patient Re-evaluated prior to induction Oxygen  Delivery Method: Circle System Utilized Preoxygenation: Pre-oxygenation with 100% oxygen  Induction Type: IV induction Ventilation: Mask ventilation without difficulty Laryngoscope Size: Miller and 2 Grade View: Grade I Tube type: Oral Tube size: 7.0 mm Number of attempts: 1 Airway Equipment and Method: Stylet and Oral airway Placement Confirmation: ETT inserted through vocal cords under direct vision, positive ETCO2 and breath sounds checked- equal and bilateral Secured at: 21 cm Tube secured with: Tape Dental Injury: Teeth and Oropharynx as per pre-operative assessment  Comments: Rt lower central incisor loose per baseline, remains intact post intubation.

## 2024-08-14 NOTE — Transfer of Care (Signed)
 Immediate Anesthesia Transfer of Care Note  Patient: Monique [name]  Procedure(s) Performed: LUMBAR LAMINECTOMY/DECOMPRESSION MICRODISCECTOMY 1 LEVEL (Left: Back)  Patient Location: PACU  Anesthesia Type:General  Level of Consciousness: drowsy and patient cooperative  Airway & Oxygen  Therapy: Patient Spontanous Breathing and Patient connected to face mask oxygen   Post-op Assessment: Report given to RN, Post -op Vital signs reviewed and stable, Patient moving all extremities, and Patient moving all extremities X 4  Post vital signs: Reviewed and stable  Last Vitals:  Vitals Value Taken Time  BP 145/62 08/14/24 12:02  Temp    Pulse 79 08/14/24 12:04  Resp 21 08/14/24 12:04  SpO2 100 % 08/14/24 12:04  Vitals shown include unfiled device data.  Last Pain:  Vitals:   08/14/24 0859  TempSrc:   PainSc: 5       Patients Stated Pain Goal: 5 (08/14/24 0859)  Complications: No notable events documented.

## 2024-08-14 NOTE — Op Note (Signed)
 08/14/2024  11:58 AM  PATIENT:  Monique [name]  84 y.o. 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 Imaging showed the listhesis with very severe stenosis L4-5. The patient tried conservative measures without relief. Pain was debilitating. Recommended decompressive hemilaminectomy L4-5 left with sublaminar decompression. Patient understood the risks, benefits, and alternatives and potential outcomes and wished to proceed.  PROCEDURE DETAILS: The patient was taken to the operating room and after induction of adequate generalized endotracheal anesthesia, the patient was rolled into the prone position on the Wilson frame and all pressure points were padded. The lumbar region was cleaned and then prepped with DuraPrep and draped in the usual sterile fashion. 5 cc of local anesthesia was injected and then a dorsal midline incision was made and carried down to the lumbo sacral fascia. The fascia was opened and the paraspinous musculature was taken down in a subperiosteal fashion to expose L4-5 on the left. Intraoperative x-ray confirmed my level, and then I used a combination of the high-speed drill and the Kerrison punches to perform a hemilaminectomy, medial facetectomy, and foraminotomy at L4-5 on the left. The underlying yellow ligament was opened and removed in a piecemeal fashion to expose the underlying dura and exiting nerve root. I undercut the lateral recess and dissected  down until I was medial to and distal to the pedicle. The nerve root was well decompressed.  I then drilled up under the spinous process and performed a sublaminar decompression to decompress the central canal and the right lateral recess from the left-hand side.  I could see both L5 nerve roots well.  I then palpated with a coronary dilator along the nerve root and into the foramen to assure adequate decompression. I felt no more compression of the nerve root. I irrigated with saline solution. Achieved hemostasis with bipolar cautery, lined the dura with Gelfoam, and then closed the fascia with 0 Vicryl. I closed the subcutaneous tissues with 2-0 Vicryl and the subcuticular tissues with 3-0 Vicryl. The skin was then closed with benzoin and Steri-Strips. The drapes were removed, a sterile dressing was applied.  My nurse practitioner was involved in the exposure, safe retraction of the neural elements, the decompression and the closure. the patient was awakened from general anesthesia and transferred to the recovery room in stable condition. At the end of the procedure all sponge, needle and instrument counts were correct.    PLAN OF CARE: Admit for overnight observation  PATIENT DISPOSITION:  PACU - hemodynamically stable.   Delay start of Pharmacological VTE agent (>24hrs) due to surgical blood loss or risk of bleeding:  yes

## 2024-08-15 ENCOUNTER — Encounter (HOSPITAL_COMMUNITY): Payer: Self-pay | Admitting: Neurological Surgery

## 2024-08-15 DIAGNOSIS — M48061 Spinal stenosis, lumbar region without neurogenic claudication: Secondary | ICD-10-CM | POA: Diagnosis not present

## 2024-08-15 MED ORDER — HYDROCODONE-ACETAMINOPHEN 7.5-325 MG PO TABS: 1.0000 | ORAL_TABLET | Freq: Four times a day (QID) | ORAL | 0 refills | Status: DC

## 2024-08-15 MED ORDER — TIZANIDINE HCL 4 MG PO TABS: 4.0000 mg | ORAL_TABLET | Freq: Three times a day (TID) | ORAL | 1 refills | Status: DC

## 2024-08-15 NOTE — Discharge Summary (Signed)
 Physician Discharge Summary  Patient ID: Monique [name] MRN: [id number] DOB/AGE: 1941-01-20 84 y.o.  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 The patient tolerated the procedure well and was taken to the recovery room and then to the floor in stable condition. The hospital course was routine. There were no complications. The wound remained clean dry and intact. Pt had appropriate back soreness. No complaints of leg pain or new N/T/W. The patient remained afebrile with stable vital signs, and tolerated a regular diet. The patient continued to increase activities, and pain was well controlled with oral pain medications.   Consults: None  Significant Diagnostic Studies:  Results for orders placed or performed during the hospital encounter of 08/14/24  Glucose, capillary   Collection Time: 08/14/24  8:42 AM  Result Value Ref Range   Glucose-Capillary 114 (H) 70 - 99 mg/dL   Comment 1 Notify RN    Comment 2 Document in Chart   Glucose, capillary   Collection Time: 08/14/24 12:06 PM  Result Value Ref Range   Glucose-Capillary 107 (H) 70 - 99 mg/dL    DG Lumbar Spine 2-3 Views Result Date: 08/14/2024 EXAM: 2 VIEW(S) XRAY OF THE LUMBAR SPINE 08/14/2024 11:20:00 AM COMPARISON: 05/02/2024 CLINICAL HISTORY: L4-5 laminectomy FINDINGS: LUMBAR SPINE: BONES: Vertebral body heights are maintained. Persistent anterolisthesis of L4 on L5 and L5 on S1 is noted. The initial film shows a needle in the posterior soft tissues at the L4-L5 level. Subsequent images show surgical retractors and instruments at the L4-L5 disc space. DISCS AND DEGENERATIVE CHANGES: Disc space narrowing is noted at L4-5 and  L5-S1. SOFT TISSUES: IMPRESSION: 1. Intraoperative localization at the L4-5 level. 2. Persistent anterolisthesis of L4 on L5 and L5 on S1. Electronically signed by: Oneil Devonshire MD MD 08/14/2024 11:34 PM EST RP Workstation: [id number]   MYOCARDIAL PERFUSION IMAGING Result Date: 07/18/2024   The study is normal. The study is low risk.   No ST deviation was noted.   LV perfusion is normal. There is no evidence of ischemia. There is no evidence of infarction.   Left ventricular function is normal. Nuclear stress EF: 58%. The left ventricular ejection fraction is normal (55-65%). End diastolic cavity size is normal. End systolic cavity size is normal.    Antibiotics:  Anti-infectives (From admission, onward)    Start     Dose/Rate Route Frequency Ordered Stop   08/14/24 2000  ceFAZolin  (ANCEF ) IVPB 2g/100 mL premix        2 g 200 mL/hr over 30 Minutes Intravenous Every 8 hours 08/14/24 1346 08/15/24 0403   08/14/24 0830  ceFAZolin  (ANCEF ) IVPB 2g/100 mL premix        2 g 200 mL/hr over 30 Minutes Intravenous On call to O.R. 08/14/24 0825 08/14/24 1047       Discharge Exam: Blood pressure 137/70, pulse 84, temperature 98.2 F (36.8 C), temperature source Oral, resp. rate 18, height 5' 5 (1.651 m), weight 69.9 kg, SpO2 95%. Neurologic: Grossly normal Ambulating and voiding well incdiion cdi   Discharge Medications:   Allergies as of 08/15/2024       Reactions   Morphine  Other (See Comments)   Medication did not work for pain  Penicillin G Other (See Comments)   penicillin G   Clindamycin /lincomycin    Penicillins Other (See Comments)   UNSPECIFIED REACTION  Unknown reaction as a child.  Tolerated Zosyn  2017. Tolerated Cephalosporin Date: 02/17/22.   Sulfamethoxazole-trimethoprim         Medication List     STOP taking these medications    clopidogrel  75 MG tablet Commonly known as: PLAVIX        TAKE these medications    albuterol  108 (90 Base) MCG/ACT  inhaler Commonly known as: VENTOLIN  HFA Inhale 2 puffs into the lungs every 6 (six) hours as needed for wheezing or shortness of breath.   amLODipine  5 MG tablet Commonly known as: NORVASC  TAKE 1 TABLET BY MOUTH DAILY   atorvastatin  80 MG tablet Commonly known as: LIPITOR  Take 1 tablet (80 mg total) by mouth daily.   Biotrue Soln Place 1 drop into both eyes as needed (dry eyes).   budesonide-formoterol  80-4.5 MCG/ACT inhaler Commonly known as: SYMBICORT Inhale 2 puffs into the lungs in the morning and at bedtime. Also uses as needed for emergency purposes   carvedilol  12.5 MG tablet Commonly known as: COREG  TAKE 1 TABLET BY MOUTH 2 TIMES A DAY   cetirizine 10 MG tablet Commonly known as: ZYRTEC Take 10 mg by mouth in the morning.   chlorhexidine  4 % external liquid Commonly known as: HIBICLENS  Apply 15 mLs (1 Application total) topically as directed for 30 doses. Use as directed daily for 5 days every other week for 6 weeks.   DULoxetine  30 MG capsule Commonly known as: CYMBALTA  Take 30 mg by mouth at bedtime.   furosemide  20 MG tablet Commonly known as: LASIX  Take 1 tablet (20 mg total) by mouth as needed. What changed:  when to take this reasons to take this   HYDROcodone -acetaminophen  7.5-325 MG tablet Commonly known as: NORCO Take 1 tablet by mouth every 6 (six) hours.   ipratropium 0.03 % nasal spray Commonly known as: ATROVENT Place 2 sprays into both nostrils 3 (three) times daily as needed for rhinitis.   levothyroxine  25 MCG tablet Commonly known as: SYNTHROID  TAKE 1 TABLET BY MOUTH DAILY   lisinopril  40 MG tablet Commonly known as: ZESTRIL  TAKE 1 TABLET BY MOUTH DAILY   Mounjaro  15 MG/0.5ML Pen Generic drug: tirzepatide  Inject 15 mg into the skin once a week.   MULTIVITAMIN WOMEN 50+ PO Take 2 tablets by mouth daily.   mupirocin  ointment 2 % Commonly known as: BACTROBAN  Place 1 Application into the nose 2 (two) times daily for 60 doses. Use  as directed 2 times daily for 5 days every other week for 6 weeks.   naproxen  sodium 220 MG tablet Commonly known as: ALEVE  Take 220 mg by mouth 2 (two) times daily.   nitroGLYCERIN  0.4 MG SL tablet Commonly known as: NITROSTAT  Place 1 tablet (0.4 mg total) under the tongue as needed.   OVER THE COUNTER MEDICATION Apply 1 Application topically daily as needed (pain). Magna Sport Balm   pantoprazole  40 MG tablet Commonly known as: PROTONIX  Take 1 tablet (40 mg total) by mouth 2 (two) times daily before a meal.   Phazyme Ultimate 500 MG Caps Generic drug: Simethicone  Take 500 mg by mouth daily as needed (bloating).   pregabalin  25 MG capsule Commonly known as: LYRICA  Take 1 capsule (25 mg total) by mouth daily.   tiZANidine  4 MG tablet Commonly known as: Zanaflex  Take 1 tablet (4 mg total) by mouth 3 (three) times daily.  Disposition: home   Final Dx: lumbar laminectomy L4-5 left  Discharge Instructions      Remove dressing in 72 hours   Complete by: As directed    Call MD for:   Complete by: As directed    Call MD for:  difficulty breathing, headache or visual disturbances   Complete by: As directed    Call MD for:  extreme fatigue   Complete by: As directed    Call MD for:  hives   Complete by: As directed    Call MD for:  persistant dizziness or light-headedness   Complete by: As directed    Call MD for:  persistant nausea and vomiting   Complete by: As directed    Call MD for:  redness, tenderness, or signs of infection (pain, swelling, redness, odor or green/yellow discharge around incision site)   Complete by: As directed    Call MD for:  severe uncontrolled pain   Complete by: As directed    Call MD for:  temperature >100.4   Complete by: As directed    Driving Restrictions   Complete by: As directed    No driving for 2 weeks, no riding in the car for 1 week   Increase activity slowly   Complete by: As directed            Signed: Nakhia Levitan Lunell Robart 08/15/2024, 7:59 AM

## 2024-08-15 NOTE — Progress Notes (Signed)
 Patient alert and oriented, voided, ambulated. Surgical site clean and dry no sign of infection. D/c instructions explain and given to the patient. Report given to the patient nurse at [facility]. All questions answered.

## 2024-08-15 NOTE — Evaluation (Signed)
 Occupational Therapy Evaluation Patient Details Name: Monique [name] MRN: [id number] DOB: Mar 08, 1941 Today's Date: 08/15/2024   History of Present Illness   Monique [name] isa  84 yo female who is s/p L4-5 lami 1/12. PMHx: anemia, anxiety, asthma, CAD, HTN, hypothyroidism     Clinical Impressions Monique [name] was evaluated s/p the above spine surgery. She is indep at an ILF at baseline. Upon evaluation pt was limited by mild surgical discomfort, spinal precautions and decreased activity tolerance. Overall she was able to mobilize with RW and complete ADLs with mod I after review of precautions. Provided cues and education on spinal precautions and compensatory techniques throughout, handout provided and pt demonstrated great recall during ADLs and mobility. Pt does not require further acute OT services. Recommend d/c home with support of family.       If plan is discharge home, recommend the following:   Assistance with cooking/housework;Assist for transportation     Functional Status Assessment   Patient has had a recent decline in their functional status and demonstrates the ability to make significant improvements in function in a reasonable and predictable amount of time.     Equipment Recommendations   None recommended by OT      Precautions/Restrictions   Precautions Precautions: Fall;Back Precaution Booklet Issued: Yes (comment) Recall of Precautions/Restrictions: Intact Restrictions Weight Bearing Restrictions Per Provider Order: No     Mobility Bed Mobility Overal bed mobility: Modified Independent             General bed mobility comments: log roll    Transfers Overall transfer level: Modified independent                 General transfer comment: with RW for comfort and safety      Balance Overall balance assessment: Needs assistance Sitting-balance support: Feet supported Sitting balance-Leahy Scale: Good     Standing balance  support: No upper extremity supported Standing balance-Leahy Scale: Fair Standing balance comment: statically                           ADL either performed or assessed with clinical judgement   ADL Overall ADL's : Modified independent                                       General ADL Comments: mod I for ADLs after review of compensatory techniques     Vision Baseline Vision/History: 0 No visual deficits Vision Assessment?: No apparent visual deficits     Perception Perception: Within Functional Limits       Praxis Praxis: WFL       Pertinent Vitals/Pain Pain Assessment Pain Assessment: Faces Faces Pain Scale: Hurts a little bit Pain Location: back Pain Descriptors / Indicators: Discomfort, Grimacing, Guarding Pain Intervention(s): Limited activity within patient's tolerance, Monitored during session     Extremity/Trunk Assessment Upper Extremity Assessment Upper Extremity Assessment: Overall WFL for tasks assessed   Lower Extremity Assessment Lower Extremity Assessment: Defer to PT evaluation   Cervical / Trunk Assessment Cervical / Trunk Assessment: Back Surgery   Communication Communication Communication: No apparent difficulties   Cognition Arousal: Alert Behavior During Therapy: WFL for tasks assessed/performed Cognition: No apparent impairments  Following commands: Intact       Cueing  General Comments   Cueing Techniques: Verbal cues  VSS   Exercises     Shoulder Instructions      Home Living Family/patient expects to be discharged to:: Private residence ([facility]) Living Arrangements: Alone Available Help at Discharge: Family;Friend(s);Available PRN/intermittently Type of Home: Apartment Home Access: Elevator (3rd floor)     Home Layout: One level     Bathroom Shower/Tub: Arts Development Officer Toilet: Handicapped height     Home Equipment: Clinical Biochemist (2 wheels);Shower seat;Grab bars - tub/shower          Prior Functioning/Environment Prior Level of Function : Independent/Modified Independent             Mobility Comments: indep, attends PT/exercise for shoulder and knee ADLs Comments: indep including driving, walks to dining hall 2x/day for meals    OT Problem List: Decreased strength;Decreased range of motion;Decreased activity tolerance   OT Treatment/Interventions:        OT Goals(Current goals can be found in the care plan section)   Acute Rehab OT Goals Patient Stated Goal: home OT Goal Formulation: With patient Time For Goal Achievement: 08/15/24 Potential to Achieve Goals: Good   OT Frequency:       Co-evaluation              AM-PAC OT 6 Clicks Daily Activity     Outcome Measure Help from another person eating meals?: None Help from another person taking care of personal grooming?: None Help from another person toileting, which includes using toliet, bedpan, or urinal?: None Help from another person bathing (including washing, rinsing, drying)?: None Help from another person to put on and taking off regular upper body clothing?: None Help from another person to put on and taking off regular lower body clothing?: None 6 Click Score: 24   End of Session Equipment Utilized During Treatment: Rolling walker (2 wheels) Nurse Communication: Mobility status  Activity Tolerance: Patient tolerated treatment well Patient left: in chair;with call bell/phone within reach  OT Visit Diagnosis: Unsteadiness on feet (R26.81);Other abnormalities of gait and mobility (R26.89);Muscle weakness (generalized) (M62.81)                Time: 9159-9097 OT Time Calculation (min): 22 min Charges:  OT General Charges $OT Visit: 1 Visit OT Evaluation $OT Eval Low Complexity: 1 Low  Lucie Kendall, OTR/L Acute Rehabilitation Services Office [phone] Secure Chat Communication Preferred   Lucie JONETTA Kendall 08/15/2024, 9:58 AM

## 2024-08-15 NOTE — TOC Transition Note (Addendum)
 Transition of Care [facility]) - Discharge Note   Patient Details  Name: Monique [name] MRN: [id number] Date of Birth: 1940/11/20  [facility] CM/SW Contact:  Andrez JULIANNA George, RN Phone Number: 08/15/2024, 2:57 PM   Clinical Narrative:     Pt is discharging to [facility] for a few days under private pay before she returns to her apartment. [facility] has verified bed availability. She will go to room 9.  Daughter is transporting her to [facility].   Number for report: [phone] ext [phone]  SDOH Interventions Today    Flowsheet Row Most Recent Value  SDOH Interventions   Physical Activity Interventions Inpatient TOC  Stress Interventions Intervention Not Indicated, Inpatient TOC   Pt to increase activity as she is discharging to rehab at [facility].   Final next level of care: Skilled Nursing Facility Barriers to Discharge: No Barriers Identified   Patient Goals and CMS Choice            Discharge Placement PASRR number recieved: 08/15/24            Patient chooses bed at: [facility] Patient to be transferred to facility by: family Name of family member notified: daughter by the patient Patient and family notified of of transfer: 08/15/24  Discharge Plan and Services Additional resources added to the After Visit Summary for                                       Social Drivers of Health (SDOH) Interventions SDOH Screenings   Food Insecurity: No Food Insecurity (11/11/2023)  Housing: Unknown (11/11/2023)  Transportation Needs: No Transportation Needs (11/11/2023)  Utilities: Not At Risk (11/11/2023)  Depression (PHQ2-9): Low Risk (06/15/2024)  Financial Resource Strain: Low Risk (10/22/2022)   Received from [facility]  Physical Activity: Insufficiently Active (12/30/2021)   Received from [facility]  Social Connections: Socially Integrated (12/30/2021)   Received from [facility]  Stress: Stress Concern Present (12/30/2021)    Received from [facility]  Tobacco Use: Low Risk (08/14/2024)     Readmission Risk Interventions     No data to display

## 2024-08-15 NOTE — Evaluation (Signed)
 Physical Therapy Evaluation Patient Details Name: Monique [name] MRN: [id number] DOB: Dec 31, 1940 Today's Date: 08/15/2024  History of Present Illness  Monique [name] isa  84 yo female who is s/p L4-5 lami 1/12. PMHx: anemia, anxiety, asthma, CAD, HTN, hypothyroidism  Clinical Impression  Pt presents to PT with deficits in sensation, endurance, power, strength, gait. Pt is able to ambulate for household distances with or without support of DME, demonstrating good stability. PT encourages the pt to utilize her RW when ambulating within the [facility] community for endurance purposes. PT also recommends home health PT at the time of discharge. Pt expresses the desire to return to [facility] with respite care, case manager is aware and contacting [facility].        If plan is discharge home, recommend the following: A little help with bathing/dressing/bathroom;Assistance with cooking/housework;Assist for transportation;Help with stairs or ramp for entrance   Can travel by private vehicle        Equipment Recommendations None recommended by PT  Recommendations for Other Services       Functional Status Assessment Patient has had a recent decline in their functional status and demonstrates the ability to make significant improvements in function in a reasonable and predictable amount of time.     Precautions / Restrictions Precautions Precautions: Fall;Back Precaution Booklet Issued: Yes (comment) Recall of Precautions/Restrictions: Intact Restrictions Weight Bearing Restrictions Per Provider Order: No      Mobility  Bed Mobility               General bed mobility comments: not assessed, pt in recliner upon arrival, modI with OT    Transfers Overall transfer level: Modified independent Equipment used: None               General transfer comment: increased time    Ambulation/Gait Ambulation/Gait assistance: Modified independent (Device/Increase  time) Gait Distance (Feet): 300 Feet (200' with RW, 100' without DME) Assistive device: Rolling walker (2 wheels), None Gait Pattern/deviations: Step-through pattern Gait velocity: reduced Gait velocity interpretation: <1.8 ft/sec, indicate of risk for recurrent falls   General Gait Details: slowed step-through gait, reduced stride length, no significant balance deviations noted  Stairs Stairs:  (pt has an elevator, does not require stair negotiation upon discharge)          Wheelchair Mobility     Tilt Bed    Modified Rankin (Stroke Patients Only)       Balance Overall balance assessment: Needs assistance Sitting-balance support: No upper extremity supported, Feet supported Sitting balance-Leahy Scale: Good     Standing balance support: No upper extremity supported, During functional activity Standing balance-Leahy Scale: Good                               Pertinent Vitals/Pain Pain Assessment Pain Assessment: Faces Faces Pain Scale: Hurts a little bit Pain Location: back Pain Descriptors / Indicators: Sore Pain Intervention(s): Monitored during session    Home Living Family/patient expects to be discharged to:: Private residence ([facility]) Living Arrangements: Alone Available Help at Discharge: Family;Friend(s);Available PRN/intermittently Type of Home: Apartment Home Access: Elevator       Home Layout: One level Home Equipment: [employer]ant (2 wheels);Shower seat;Grab bars - tub/shower      Prior Function Prior Level of Function : Independent/Modified Independent             Mobility Comments: indep, attends PT/exercise for shoulder and  knee ADLs Comments: indep including driving, walks to dining hall 2x/day for meals     Extremity/Trunk Assessment   Upper Extremity Assessment Upper Extremity Assessment: Overall WFL for tasks assessed    Lower Extremity Assessment Lower Extremity Assessment: RLE deficits/detail;LLE  deficits/detail RLE Deficits / Details: pt reports numbness at ankles and distally, this is improvement  compared to pre-surgery where she reports numbness from hips to toes. ROM WFL, strength grossly 4/5 LLE Deficits / Details: pt reports numbness at ankles and distally, this is improvement compared to pre-surgery where she reports numbness from hips to toes. ROM WFL, strength grossly 4/5    Cervical / Trunk Assessment Cervical / Trunk Assessment: Back Surgery  Communication   Communication Communication: No apparent difficulties    Cognition Arousal: Alert Behavior During Therapy: WFL for tasks assessed/performed   PT - Cognitive impairments: No apparent impairments                         Following commands: Intact       Cueing Cueing Techniques: Verbal cues     General Comments General comments (skin integrity, edema, etc.): VSS on RA    Exercises     Assessment/Plan    PT Assessment All further PT needs can be met in the next venue of care  PT Problem List Decreased strength;Decreased activity tolerance;Decreased balance;Decreased mobility;Impaired sensation;Pain       PT Treatment Interventions      PT Goals (Current goals can be found in the Care Plan section)       Frequency       Co-evaluation               AM-PAC PT 6 Clicks Mobility  Outcome Measure Help needed turning from your back to your side while in a flat bed without using bedrails?: None Help needed moving from lying on your back to sitting on the side of a flat bed without using bedrails?: None Help needed moving to and from a bed to a chair (including a wheelchair)?: None Help needed standing up from a chair using your arms (e.g., wheelchair or bedside chair)?: None Help needed to walk in hospital room?: None Help needed climbing 3-5 steps with a railing? : A Little 6 Click Score: 23    End of Session Equipment Utilized During Treatment: Gait belt Activity Tolerance:  Patient tolerated treatment well Patient left: in chair;with call bell/phone within reach Nurse Communication: Mobility status PT Visit Diagnosis: Other abnormalities of gait and mobility (R26.89)    Time: 8988-8963 PT Time Calculation (min) (ACUTE ONLY): 25 min   Charges:   PT Evaluation $PT Eval Low Complexity: 1 Low   PT General Charges $$ ACUTE PT VISIT: 1 Visit         Bernardino JINNY Ruth, PT, DPT Acute Rehabilitation Office [phone]   Bernardino JINNY Ruth 08/15/2024, 11:01 AM

## 2024-08-15 NOTE — Progress Notes (Signed)
 Physical Therapy Quick Note  PT has completed initial evaluation.    Overall, patient at mod I assistance level.   PT Follow up recommended: Home Health PT Equipment recommended:  None recommended Complete evaluation note to follow.     Rexie Catena, PT, DPT Acute Rehabilitation Office [phone]

## 2024-08-15 NOTE — Care Management Obs Status (Signed)
 MEDICARE OBSERVATION STATUS NOTIFICATION   Patient Details  Name: Monique [name] MRN: [id number] Date of Birth: Nov 26, 1940   Medicare Observation Status Notification Given:  Yes    Loletha, Bertini 08/15/2024, 10:05 AM

## 2024-08-15 NOTE — NC FL2 (Addendum)
 " [place]  MEDICAID FL2 LEVEL OF CARE FORM     IDENTIFICATION  Patient Name: Monique [name] Birthdate: 11/17/1940 Sex: female Admission Date (Current Location): 08/14/2024  [facility] and [place] Number:  Producer, Television/film/video and Address:  [facility], [address] [place], [place] [address]      Provider Number: [id number]  Attending Physician Name and Address:  Joshua Alm Hamilton, MD  Relative Name and Phone Number:       Current Level of Care: Hospital Recommended Level of Care: Skilled Nursing Facility Prior Approval Number:    Date Approved/Denied:   PASRR Number: [id number]  Discharge Plan: SNF    Current Diagnoses: Patient Active Problem List   Diagnosis Date Noted   S/P lumbar laminectomy 08/14/2024   Preoperative clearance 07/11/2024   Spinal stenosis 06/15/2024   Obesity (BMI 30-39.9) 11/08/2023   Oral candidiasis 09/23/2023   Postnasal drip 09/03/2023   Hypertrophy of nasal turbinates 09/03/2023   Chronic rhinitis 09/03/2023   Anxiety 04/08/2023   Cholelithiasis 02/18/2023   Elevated TSH 02/18/2023   Type 2 diabetes mellitus with cardiac complication (HCC) 01/22/2023   Allergy 01/21/2023   Asthma 01/21/2023   Neuropathy 01/21/2023   Hypothyroidism 01/15/2023   Osteoarthritis, multiple sites 01/07/2023   GERD (gastroesophageal reflux disease) 01/07/2023   Overweight (BMI 25.0-29.9) 01/07/2023   OA (osteoarthritis) of knee 02/16/2022   Osteoarthritis of left knee 02/16/2022   Ureteral stone with hydronephrosis 06/21/2020   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   CAD in native artery 11/17/2018   Anemia 06/08/2018   Redness and swelling of upper arm 06/08/2018   CAD S/P percutaneous coronary angioplasty 05/30/2018   Chest pain 05/27/2018   Sinus tachycardia 08/20/2017   Dyslipidemia, goal LDL below 70 08/20/2017   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   Essential hypertension    Primary osteoarthritis of  right hip 07/08/2011    Orientation RESPIRATION BLADDER Height & Weight     Self, Time, Place, Situation  Normal Continent Weight: 69.9 kg Height:  5' 5 (165.1 cm)  BEHAVIORAL SYMPTOMS/MOOD NEUROLOGICAL BOWEL NUTRITION STATUS        Diet (Regular with thin liquids)  AMBULATORY STATUS COMMUNICATION OF NEEDS Skin   Limited Assist Verbally Surgical wounds (back)                       Personal Care Assistance Level of Assistance  Bathing, Feeding, Dressing Bathing Assistance: Limited assistance Feeding assistance: Independent Dressing Assistance: Limited assistance     Functional Limitations Info  Sight, Hearing, Speech Sight Info: Adequate Hearing Info: Adequate Speech Info: Adequate    SPECIAL CARE FACTORS FREQUENCY  PT (By licensed PT), OT (By licensed OT)     PT Frequency: 5x/wk OT Frequency: 5x/wk            Contractures Contractures Info: Not present    Additional Factors Info  Code Status, Allergies, Psychotropic Code Status Info: Full Allergies Info: morphine / Penicillin G/ Clindamycin / lincomycin/ Penicllin/ Sulfamethoxazole-trimethoprim           Current Medications (08/15/2024):  This is the current hospital active medication list Current Facility-Administered Medications  Medication Dose Route Frequency Provider Last Rate Last Admin   0.9 %  sodium chloride  infusion  250 mL Intravenous Continuous Joshua Alm Hamilton, MD   Stopped at 08/15/24 0821   0.9 % NaCl with KCl 20 mEq/ L  infusion   Intravenous Continuous Joshua Alm Hamilton, MD  Stopped at 08/15/24 0844   acetaminophen  (TYLENOL ) tablet 650 mg  650 mg Oral Q4H PRN Joshua Alm Hamilton, MD       Or   acetaminophen  (TYLENOL ) suppository 650 mg  650 mg Rectal Q4H PRN Joshua Alm Hamilton, MD       amLODipine  (NORVASC ) tablet 5 mg  5 mg Oral Daily Joshua Alm Hamilton, MD   5 mg at 08/15/24 0930   carvedilol  (COREG ) tablet 12.5 mg  12.5 mg Oral BID Joshua Alm Hamilton, MD   12.5 mg at 08/15/24 0930    Chlorhexidine  Gluconate Cloth 2 % PADS 6 each  6 each Topical Once Joshua Alm Hamilton, MD       DULoxetine  (CYMBALTA ) DR capsule 30 mg  30 mg Oral QHS Joshua Alm Hamilton, MD   30 mg at 08/14/24 1958   fluticasone  furoate-vilanterol (BREO ELLIPTA ) 100-25 MCG/ACT 1 puff  1 puff Inhalation Daily Joshua Alm Hamilton, MD   1 puff at 08/15/24 9074   HYDROcodone -acetaminophen  (NORCO) 7.5-325 MG per tablet 1 tablet  1 tablet Oral Q6H Joshua Alm Hamilton, MD   1 tablet at 08/15/24 9068   HYDROmorphone  (DILAUDID ) injection 0.5 mg  0.5 mg Intravenous Q4H PRN Joshua Alm Hamilton, MD       levothyroxine  (SYNTHROID ) tablet 25 mcg  25 mcg Oral Q0600 Joshua Alm Hamilton, MD   25 mcg at 08/15/24 9394   lisinopril  (ZESTRIL ) tablet 40 mg  40 mg Oral Daily Joshua Alm Hamilton, MD   40 mg at 08/15/24 0930   menthol  (CEPACOL) lozenge 3 mg  1 lozenge Oral PRN Joshua Alm Hamilton, MD       Or   phenol (CHLORASEPTIC) mouth spray 1 spray  1 spray Mouth/Throat PRN Joshua Alm Hamilton, MD       multivitamin with minerals tablet 1 tablet  1 tablet Oral Daily Joshua Alm Hamilton, MD   1 tablet at 08/15/24 0930   ondansetron  (ZOFRAN ) tablet 4 mg  4 mg Oral Q6H PRN Joshua Alm Hamilton, MD       Or   ondansetron  (ZOFRAN ) injection 4 mg  4 mg Intravenous Q6H PRN Joshua Alm Hamilton, MD       pantoprazole  (PROTONIX ) EC tablet 40 mg  40 mg Oral BID AC Joshua Alm Hamilton, MD   40 mg at 08/15/24 9394   pregabalin  (LYRICA ) capsule 25 mg  25 mg Oral Daily Joshua Alm Hamilton, MD   25 mg at 08/15/24 0930   senna (SENOKOT) tablet 8.6 mg  1 tablet Oral BID Joshua Alm Hamilton, MD   8.6 mg at 08/15/24 0930   sodium chloride  flush (NS) 0.9 % injection 3 mL  3 mL Intravenous Q12H Joshua Alm Hamilton, MD   3 mL at 08/14/24 1956   sodium chloride  flush (NS) 0.9 % injection 3 mL  3 mL Intravenous PRN Joshua Alm Hamilton, MD         Discharge Medications: Please see discharge summary for a list of discharge medications.  Relevant Imaging  Results:  Relevant Lab Results:   Additional Information ss# [id number]  Andrez JULIANNA George, RN     "

## 2024-08-17 ENCOUNTER — Encounter: Payer: Self-pay | Admitting: Internal Medicine

## 2024-08-17 ENCOUNTER — Non-Acute Institutional Stay (SKILLED_NURSING_FACILITY): Payer: Self-pay | Admitting: Internal Medicine

## 2024-08-17 DIAGNOSIS — E785 Hyperlipidemia, unspecified: Secondary | ICD-10-CM

## 2024-08-17 DIAGNOSIS — I951 Orthostatic hypotension: Secondary | ICD-10-CM

## 2024-08-17 DIAGNOSIS — Z9889 Other specified postprocedural states: Secondary | ICD-10-CM | POA: Diagnosis not present

## 2024-08-17 DIAGNOSIS — K219 Gastro-esophageal reflux disease without esophagitis: Secondary | ICD-10-CM | POA: Diagnosis not present

## 2024-08-17 DIAGNOSIS — G629 Polyneuropathy, unspecified: Secondary | ICD-10-CM | POA: Diagnosis not present

## 2024-08-17 DIAGNOSIS — I10 Essential (primary) hypertension: Secondary | ICD-10-CM

## 2024-08-17 MED ORDER — LISINOPRIL 40 MG PO TABS: 40.0000 mg | ORAL_TABLET | Freq: Every evening | ORAL | Status: DC

## 2024-08-17 MED ORDER — TIZANIDINE HCL 4 MG PO TABS: 4.0000 mg | ORAL_TABLET | Freq: Three times a day (TID) | ORAL | Status: AC | PRN

## 2024-08-17 NOTE — Progress Notes (Signed)
 " Provider:   Location:  [facility] Curator of Service:  SNF (31)  PCP: Mast, Man X, NP Patient Care Team: Mast, Man X, NP as PCP - General (Internal Medicine) Court Dorn PARAS, MD as PCP - Cardiology (Cardiology) Elspeth Lauraine DEL, OD as Referring Physician  Extended Emergency Contact Information Primary Emergency Contact: Vicci Glendia [place], [place] [address] [place] Home Phone: [phone] Mobile Phone: [phone] Relation: Son Secondary Emergency Contact: Arloa Veva [place], [place] [address] [place] Home Phone: [phone] Mobile Phone: [phone] Relation: Daughter  Code Status:  Goals of Care: Advanced Directive information    08/07/2024    2:50 PM  Advanced Directives  Does Patient Have a Medical Advance Directive? Yes  Type of Estate Agent of [place];Living will  Copy of Healthcare Power of Attorney in Chart? --      Significant value      Chief Complaint  Patient presents with   New Admit To SNF    HPI: Patient is a 84 y.o. female seen today for admission to SNF for respite  Patient lives by herself in [place] She has a history of hypothyroidism, hypertension, type 2 diabetes, HLD, lower extremity neuropathy, osteoarthritis, CAD.  Patient underwent elective lumbar laminectomy on 08/14/2024 She was discharged back and is now in skilled for respite  Since patient has been here she has had number of episodes in which when she tries to stand up she feels dizzy. This morning patient's systolic blood pressure was 153 and she got Coreg , lisinopril  and amlodipine .  She started feeling dizzy and went to blood pressure was checked after an hour her systolic blood pressure was in 70s.  She denies any chest pain shortness of breath.  She was put in bed and her legs were raised and her blood pressure came up Patient said that she has not been drinking enough water  was very  dry. Otherwise she is recovering her pain and numbness in her legs is much better. She is still constipated.  No other complaints Before the surgery patient was very active did not have to use any cane.   Past Medical History:  Diagnosis Date   Anemia     mild as per PCP   Anginal pain    06/20/20, relieved , no current problems   Anxiety    Arthritis    hip/ s/p recent shoulder fracture 8/12- RIGHT   Asthma    Coronary artery disease 05/30/2018   LAD PCI/DES   Depression    Diabetes mellitus without complication (HCC)    type 2, does not check blood sugar, tx Mounjaro    GI bleeding    Hiatal hernia    no current problems   History of kidney stones    surgery to remove   Humerus fracture    with wrist on right side   Hypertension    hypercholesterolemia/  EKG on chart with clearance and note 06/18/11  Mazzocchi   Hypothyroidism    Pneumonia    x several   Pre-diabetes    hx   Past Surgical History:  Procedure Laterality Date   ABDOMINAL HYSTERECTOMY     cataract eye surgery     removed cataracts   COLECTOMY WITH COLOSTOMY CREATION/HARTMANN PROCEDURE N/A 05/07/2016   Procedure: COLOSTOMY CREATION/HARTMANN PROCEDURE;  Surgeon: Bernarda Ned, MD;  Location: [facility];  Service: General;  Laterality: N/A;   COLONOSCOPY N/A 08/26/2016   Procedure: COLONOSCOPY;  Surgeon: Bernarda Ned, MD;  [facility]: [facility];  Service: Endoscopy;  Laterality: N/A;   COLOSTOMY TAKEDOWN N/A 08/27/2016   Procedure: LAPAROSCOPIC COLOSTOMY REVERSAL;  Surgeon: Bernarda Ned, MD;  Location: [facility];  Service: General;  Laterality: N/A;   CORONARY ARTERY BYPASS GRAFT N/A 11/21/2018   Procedure: CORONARY ARTERY BYPASS GRAFTING (CABG) x Three , using left internal mammary artery and right leg greater saphenous vein harvested endoscopically;  Surgeon: Lucas Dorise POUR, MD;  Location: [facility];  Service: Open Heart Surgery;  Laterality: N/A;   CORONARY STENT INTERVENTION N/A 05/30/2018   Procedure:  CORONARY STENT INTERVENTION;  Surgeon: Court Dorn PARAS, MD;  Location: [facility];  Service: Cardiovascular;  Laterality: N/A;   CYSTOSCOPY WITH RETROGRADE PYELOGRAM, URETEROSCOPY AND STENT PLACEMENT Bilateral 06/21/2020   Procedure: CYSTOSCOPY WITH RETROGRADE PYELOGRAM, AND BILATERAL STENT PLACEMENT;  Surgeon: Alvaro Hummer, MD;  Location: [facility];  Service: Urology;  Laterality: Bilateral;   CYSTOSCOPY WITH RETROGRADE PYELOGRAM, URETEROSCOPY AND STENT PLACEMENT Bilateral 07/12/2020   Procedure: CYSTOSCOPY WITH RETROGRADE PYELOGRAM, URETEROSCOPY AND STENT PLACEMENT;  Surgeon: Cam Morene ORN, MD;  Location: [facility];  Service: Urology;  Laterality: Bilateral;  2 HRS   EYE SURGERY     eyelid tuck bilateral   HOLMIUM LASER APPLICATION Bilateral 07/12/2020   Procedure: HOLMIUM LASER APPLICATION;  Surgeon: Cam Morene ORN, MD;  [facility]: [facility];  Service: Urology;  Laterality: Bilateral;   LAPAROTOMY N/A 05/07/2016   Procedure: EXPLORATORY LAPAROTOMY, LYLSIS OF ADHSIONS, FLO OF PERITONEAL ABSCESS;  Surgeon: Bernarda Ned, MD;  [facility]: [facility];  Service: General;  Laterality: N/A;   LEFT HEART CATH AND CORONARY ANGIOGRAPHY N/A 05/30/2018   Procedure: LEFT HEART CATH AND CORONARY ANGIOGRAPHY;  Surgeon: Court Dorn PARAS, MD;  [facility];  Service: Cardiovascular;  Laterality: N/A;   LEFT HEART CATH AND CORONARY ANGIOGRAPHY N/A 11/17/2018   Procedure: LEFT HEART CATH AND CORONARY ANGIOGRAPHY;  Surgeon: Court Dorn PARAS, MD;  [facility];  Service: Cardiovascular;  Laterality: N/A;   LUMBAR LAMINECTOMY/DECOMPRESSION MICRODISCECTOMY Left 08/14/2024   Procedure: LUMBAR LAMINECTOMY/DECOMPRESSION MICRODISCECTOMY 1 LEVEL;  Surgeon: Joshua Alm Hamilton, MD;  Location: [facility] OR;  Service: Neurosurgery;  Laterality: Left;  Laminectomy and Foraminotomy - L4-L5 - left with sublaminar decompression   PARATHYROIDECTOMY     one lobe- benign per pt   TEE WITHOUT  CARDIOVERSION N/A 11/21/2018   Procedure: TRANSESOPHAGEAL ECHOCARDIOGRAM (TEE);  Surgeon: Lucas Dorise POUR, MD;  Location: [facility] OR;  Service: Open Heart Surgery;  Laterality: N/A;   TONSILLECTOMY     TOTAL HIP ARTHROPLASTY  07/08/2011   Procedure: TOTAL HIP ARTHROPLASTY;  Surgeon: Dempsey GAILS Aluisio;  Location: [facility];  Service: Orthopedics;  Laterality: Right;   TOTAL KNEE ARTHROPLASTY Left 02/16/2022   Procedure: TOTAL KNEE ARTHROPLASTY;  Surgeon: Melodi Dempsey, MD;  Location: [facility];  Service: Orthopedics;  Laterality: Left;    reports that she has never smoked. She has never used smokeless tobacco. She reports current alcohol use. She reports that she does not use drugs. Social History   Socioeconomic History   Marital status: Widowed    Spouse name: Not on file   Number of children: 2   Years of education: Not on file   Highest education level: Not on file  Occupational History   Occupation: retired  Tobacco Use   Smoking status: Never   Smokeless tobacco: Never  Vaping Use   Vaping status: Never Used  Substance and Sexual Activity   Alcohol use: Yes    Comment: rare - 2 x year   Drug use: No   Sexual activity: Not Currently    Birth control/protection: Surgical    Comment: Hysterectomy  Other Topics Concern   Not on file  Social History Narrative   Not on file   Social Drivers of Health   Tobacco Use: Low Risk (08/14/2024)   Patient History    Smoking Tobacco Use: Never    Smokeless Tobacco Use: Never    Passive Exposure: Not on file  Financial Resource Strain: Low Risk (10/22/2022)   Received from [facility]   Overall Financial Resource Strain (CARDIA)    Difficulty of Paying Living Expenses: Not hard at all  Food Insecurity: No Food Insecurity (11/11/2023)   Hunger Vital Sign    Worried About Running Out of Food in the Last Year: Never true    Ran Out of Food in the Last Year: Never true  Transportation Needs: No Transportation Needs (11/11/2023)   PRAPARE -  Administrator, Civil Service (Medical): No    Lack of Transportation (Non-Medical): No  Physical Activity: Insufficiently Active (12/30/2021)   Received from [facility]   Exercise Vital Sign    On average, how many days per week do you engage in moderate to strenuous exercise (like a brisk walk)?: 1 day    On average, how many minutes do you engage in exercise at this level?: 30 min  Stress: Stress Concern Present (12/30/2021)   Received from [facility] - Occupational Stress Questionnaire    Feeling of Stress : To some extent  Social Connections: Socially Integrated (12/30/2021)   [facility]   Social Network    How would you rate your social network (family, work, friends)?: Good participation with social networks  Intimate Partner Violence: Not At Risk (11/11/2023)   Humiliation, Afraid, Rape, and Kick questionnaire    Fear of Current or Ex-Partner: No    Emotionally Abused: No    Physically Abused: No    Sexually Abused: No  Depression (PHQ2-9): Low Risk (06/15/2024)   Depression (PHQ2-9)    PHQ-2 Score: 0  Alcohol Screen: Not on file  Housing: Unknown (11/11/2023)   Housing Stability Vital Sign    Unable to Pay for Housing in the Last Year: No    Number of Times Moved in the Last Year: Not on file    Homeless in the Last Year: No  Utilities: Not At Risk (11/11/2023)   AHC Utilities    Threatened with loss of utilities: No  Health Literacy: Not on file    Functional Status Survey:    Family History  Problem Relation Age of Onset   CAD Father 66       PCI   Liver disease Neg Hx    Esophageal cancer Neg Hx    Colon cancer Neg Hx     Health Maintenance  Topic Date Due   Zoster Vaccines- Shingrix (1 of 2) Never done   DTaP/Tdap/Td (2 - Td or Tdap) 02/22/2024   OPHTHALMOLOGY EXAM  05/03/2024   FOOT EXAM  06/16/2024   HEMOGLOBIN A1C  07/11/2024   Diabetic kidney evaluation - Urine ACR   08/09/2024   Medicare Annual Wellness (AWV)  11/10/2024   COVID-19 Vaccine (11 - Mixed Product risk 2025-26 season) 12/09/2024   Diabetic kidney  evaluation - eGFR measurement  08/07/2025   Pneumococcal Vaccine: 50+ Years  Completed   Influenza Vaccine  Completed   Bone Density Scan  Completed   Meningococcal B Vaccine  Aged Out    Allergies[1]  Outpatient Encounter Medications as of 08/17/2024  Medication Sig   albuterol  (VENTOLIN  HFA) 108 (90 Base) MCG/ACT inhaler Inhale 2 puffs into the lungs every 6 (six) hours as needed for wheezing or shortness of breath.   amLODipine  (NORVASC ) 5 MG tablet TAKE 1 TABLET BY MOUTH DAILY (Patient not taking: Reported on 08/17/2024)   atorvastatin  (LIPITOR ) 80 MG tablet Take 1 tablet (80 mg total) by mouth daily.   budesonide-formoterol  (SYMBICORT) 80-4.5 MCG/ACT inhaler Inhale 2 puffs into the lungs in the morning and at bedtime. Also uses as needed for emergency purposes   carvedilol  (COREG ) 12.5 MG tablet TAKE 1 TABLET BY MOUTH 2 TIMES A DAY   cetirizine (ZYRTEC) 10 MG tablet Take 10 mg by mouth in the morning.   chlorhexidine  (HIBICLENS ) 4 % external liquid Apply 15 mLs (1 Application total) topically as directed for 30 doses. Use as directed daily for 5 days every other week for 6 weeks.   DULoxetine  (CYMBALTA ) 30 MG capsule Take 30 mg by mouth at bedtime.   furosemide  (LASIX ) 20 MG tablet Take 1 tablet (20 mg total) by mouth as needed. (Patient taking differently: Take 20 mg by mouth daily as needed for fluid or edema.)   HYDROcodone -acetaminophen  (NORCO) 7.5-325 MG tablet Take 1 tablet by mouth every 6 (six) hours.   ipratropium (ATROVENT) 0.03 % nasal spray Place 2 sprays into both nostrils 3 (three) times daily as needed for rhinitis.   levothyroxine  (SYNTHROID ) 25 MCG tablet TAKE 1 TABLET BY MOUTH DAILY   lisinopril  (ZESTRIL ) 40 MG tablet TAKE 1 TABLET BY MOUTH DAILY   Multiple Vitamins-Minerals (MULTIVITAMIN WOMEN 50+ PO) Take 2 tablets by mouth  daily.   mupirocin  ointment (BACTROBAN ) 2 % Place 1 Application into the nose 2 (two) times daily for 60 doses. Use as directed 2 times daily for 5 days every other week for 6 weeks.   naproxen  sodium (ALEVE ) 220 MG tablet Take 220 mg by mouth 2 (two) times daily.   nitroGLYCERIN  (NITROSTAT ) 0.4 MG SL tablet Place 1 tablet (0.4 mg total) under the tongue as needed.   OVER THE COUNTER MEDICATION Apply 1 Application topically daily as needed (pain). Magna Sport Balm   pantoprazole  (PROTONIX ) 40 MG tablet Take 1 tablet (40 mg total) by mouth 2 (two) times daily before a meal.   pregabalin  (LYRICA ) 25 MG capsule Take 1 capsule (25 mg total) by mouth daily.   Simethicone  (PHAZYME ULTIMATE) 500 MG CAPS Take 500 mg by mouth daily as needed (bloating).   Soft Lens Products (BIOTRUE) SOLN Place 1 drop into both eyes as needed (dry eyes).   tirzepatide  (MOUNJARO ) 15 MG/0.5ML Pen Inject 15 mg into the skin once a week.   tiZANidine  (ZANAFLEX ) 4 MG tablet Take 1 tablet (4 mg total) by mouth 3 (three) times daily.   No facility-administered encounter medications on file as of 08/17/2024.    Review of Systems  Constitutional:  Positive for activity change. Negative for appetite change.  HENT: Negative.    Respiratory:  Negative for cough and shortness of breath.   Cardiovascular:  Negative for leg swelling.  Gastrointestinal:  Positive for constipation.  Genitourinary: Negative.   Musculoskeletal:  Positive for back pain and gait problem. Negative for arthralgias and myalgias.  Skin:  Negative.   Neurological:  Positive for dizziness and weakness.  Psychiatric/Behavioral:  Negative for confusion, dysphoric mood and sleep disturbance.     There were no vitals filed for this visit. There is no height or weight on file to calculate BMI. Physical Exam Vitals reviewed.  Constitutional:      Appearance: Normal appearance.  HENT:     Head: Normocephalic.     Nose: Nose normal.     Mouth/Throat:      Mouth: Mucous membranes are moist.     Pharynx: Oropharynx is clear.  Eyes:     Pupils: Pupils are equal, round, and reactive to light.  Cardiovascular:     Rate and Rhythm: Normal rate and regular rhythm.     Pulses: Normal pulses.     Heart sounds: Normal heart sounds. No murmur heard. Pulmonary:     Effort: Pulmonary effort is normal.     Breath sounds: Normal breath sounds.  Abdominal:     General: Abdomen is flat. Bowel sounds are normal.     Palpations: Abdomen is soft.  Musculoskeletal:        General: No swelling.     Cervical back: Neck supple.  Skin:    General: Skin is warm.     Comments: Incision in the back looks good  Neurological:     General: No focal deficit present.     Mental Status: She is alert and oriented to person, place, and time.  Psychiatric:        Mood and Affect: Mood normal.        Thought Content: Thought content normal.     Labs reviewed: Basic Metabolic Panel: Recent Labs    09/16/23 1438 01/10/24 1117 08/07/24 1525  NA 140 139 141  K 5.3* 4.6 4.2  CL 102 101 103  CO2 20 16* 28  GLUCOSE 141* 108* 94  BUN 34* 31* 24*  CREATININE 1.20* 0.99 0.81  CALCIUM  9.9 10.4* 9.5   Liver Function Tests: Recent Labs    07/05/24 0927  AST 16  ALT 13  ALKPHOS 73  BILITOT 1.2  PROT 5.9*  ALBUMIN  4.2   No results for input(s): LIPASE, AMYLASE in the last 8760 hours. No results for input(s): AMMONIA in the last 8760 hours. CBC: Recent Labs    08/07/24 1525  WBC 8.7  HGB 13.4  HCT 40.6  MCV 90.6  PLT 227   Cardiac Enzymes: No results for input(s): CKTOTAL, CKMB, CKMBINDEX, TROPONINI in the last 8760 hours. BNP: Invalid input(s): POCBNP Lab Results  Component Value Date   HGBA1C 5.7 (H) 01/10/2024   Lab Results  Component Value Date   TSH 2.210 01/10/2024   Lab Results  Component Value Date   VITAMINB12 300 08/10/2023   No results found for: FOLATE Lab Results  Component Value Date   IRON 24 (L)  06/08/2018   TIBC 432 06/08/2018   FERRITIN 10 (L) 06/08/2018    Imaging and Procedures obtained prior to SNF admission: DG Lumbar Spine 2-3 Views Result Date: 08/14/2024 EXAM: 2 VIEW(S) XRAY OF THE LUMBAR SPINE 08/14/2024 11:20:00 AM COMPARISON: 05/02/2024 CLINICAL HISTORY: L4-5 laminectomy FINDINGS: LUMBAR SPINE: BONES: Vertebral body heights are maintained. Persistent anterolisthesis of L4 on L5 and L5 on S1 is noted. The initial film shows a needle in the posterior soft tissues at the L4-L5 level. Subsequent images show surgical retractors and instruments at the L4-L5 disc space. DISCS AND DEGENERATIVE CHANGES: Disc space narrowing is noted at L4-5 and  L5-S1. SOFT TISSUES: IMPRESSION: 1. Intraoperative localization at the L4-5 level. 2. Persistent anterolisthesis of L4 on L5 and L5 on S1. Electronically signed by: Oneil Devonshire MD MD 08/14/2024 11:34 PM EST RP Workstation: [id number]    Assessment/Plan 1. Postural hypotension (Primary) Discontinue Norvasc  Can be started later after she goes back to her place Change the Lisinopril  to PM  2. Essential hypertension Continue Coreg  and Lisinopril  Discontinue Norvasc   3. S/P lumbar laminectomy Doing well Pain Controlled Will Make her Muscle relaxant as PRN Also on Hydrocodone   4. Gastroesophageal reflux disease, unspecified whether esophagitis present Pn Protonix   5. Neuropathy Lyrica  and Cymbalta  Would need taper as Outpatient as her symptoms are much improved since surgery  6. Dyslipidemia, goal LDL below 70 On statin    Family/ staff Communication:   Labs/tests ordered:      [1]  Allergies Allergen Reactions   Morphine  Other (See Comments)    Medication did not work for pain    Penicillin G Other (See Comments)    penicillin G   Clindamycin /Lincomycin    Penicillins Other (See Comments)    UNSPECIFIED REACTION  Unknown reaction as a child.  Tolerated Zosyn  2017. Tolerated Cephalosporin Date: 02/17/22.      Sulfamethoxazole-Trimethoprim    "

## 2024-08-21 ENCOUNTER — Encounter (HOSPITAL_BASED_OUTPATIENT_CLINIC_OR_DEPARTMENT_OTHER): Payer: Self-pay | Admitting: Pharmacist Clinician (PhC)/ Clinical Pharmacy Specialist

## 2024-08-23 ENCOUNTER — Other Ambulatory Visit: Payer: Self-pay | Admitting: Adult Health

## 2024-08-23 DIAGNOSIS — Z9889 Other specified postprocedural states: Secondary | ICD-10-CM

## 2024-08-23 MED ORDER — HYDROCODONE-ACETAMINOPHEN 7.5-325 MG PO TABS: 1.0000 | ORAL_TABLET | Freq: Four times a day (QID) | ORAL | 0 refills | Status: DC

## 2024-08-24 ENCOUNTER — Encounter: Payer: Self-pay | Admitting: Internal Medicine

## 2024-08-24 ENCOUNTER — Non-Acute Institutional Stay (SKILLED_NURSING_FACILITY): Payer: Self-pay | Admitting: Internal Medicine

## 2024-08-24 DIAGNOSIS — Z9889 Other specified postprocedural states: Secondary | ICD-10-CM

## 2024-08-24 DIAGNOSIS — I951 Orthostatic hypotension: Secondary | ICD-10-CM

## 2024-08-24 DIAGNOSIS — R300 Dysuria: Secondary | ICD-10-CM | POA: Diagnosis not present

## 2024-08-24 DIAGNOSIS — K219 Gastro-esophageal reflux disease without esophagitis: Secondary | ICD-10-CM | POA: Diagnosis not present

## 2024-08-24 DIAGNOSIS — I10 Essential (primary) hypertension: Secondary | ICD-10-CM

## 2024-08-24 MED ORDER — MOUNJARO 15 MG/0.5ML ~~LOC~~ SOAJ: 15.0000 mg | SUBCUTANEOUS | 11 refills | Status: AC

## 2024-08-24 MED ORDER — HYDROCODONE-ACETAMINOPHEN 7.5-325 MG PO TABS: 1.0000 | ORAL_TABLET | Freq: Three times a day (TID) | ORAL | Status: AC | PRN

## 2024-08-24 NOTE — Progress Notes (Signed)
 "  Location: [facility] Service:  SNF (31)  Provider:   Code Status:  Goals of Care:     08/07/2024    2:50 PM  Advanced Directives  Does Patient Have a Medical Advance Directive? Yes  Type of Estate Agent of [place];Living will  Copy of Healthcare Power of Attorney in Chart? --      Significant value     Chief Complaint  Patient presents with   Acute Visit    HPI: Patient is a 84 y.o. female seen today for an acute visit for Discharge Planning  Patient lives by herself in [place] She has a history of hypothyroidism, hypertension, type 2 diabetes, HLD, lower extremity neuropathy, osteoarthritis, CAD.   Patient underwent elective lumbar laminectomy on 08/14/2024 She was discharged back and is now in skilled for respite   Acute issues Dizziness due to orthostatic blood pressures I had discontinued her amlodipine  and changed her lisinopril  to night.  Since then patient has been doing well and has had no episodes.  Her blood pressure runs little high but we can follow that as outpatient S/p laminectomy Patient is doing really well able to walk with the therapy  she has appointment with Dr. Joshua next week.   She still does not know if she can bend and do other activities.   The therapy has gone with her into her apartment and it needs some  modifications ' patient is planning to stay here in respite care till changes are made.  She also wants to know if her oxycodone  can be tapered down Dysuria She has noticed some dysuria has been drinking water  to help her.   Past Medical History:  Diagnosis Date   Anemia     mild as per PCP   Anginal pain    06/20/20, relieved , no current problems   Anxiety    Arthritis    hip/ s/p recent shoulder fracture 8/12- RIGHT   Asthma    Coronary artery disease 05/30/2018   LAD PCI/DES   Depression    Diabetes mellitus without complication (HCC)    type 2, does not check blood sugar, tx  Mounjaro    GI bleeding    Hiatal hernia    no current problems   History of kidney stones    surgery to remove   Humerus fracture    with wrist on right side   Hypertension    hypercholesterolemia/  EKG on chart with clearance and note 06/18/11  Mazzocchi   Hypothyroidism    Pneumonia    x several   Pre-diabetes    hx    Past Surgical History:  Procedure Laterality Date   ABDOMINAL HYSTERECTOMY     cataract eye surgery     removed cataracts   COLECTOMY WITH COLOSTOMY CREATION/HARTMANN PROCEDURE N/A 05/07/2016   Procedure: COLOSTOMY CREATION/HARTMANN PROCEDURE;  Surgeon: Bernarda Ned, MD;  Location: [facility];  Service: General;  Laterality: N/A;   COLONOSCOPY N/A 08/26/2016   Procedure: COLONOSCOPY;  Surgeon: Bernarda Ned, MD;  Location: [facility];  Service: Endoscopy;  Laterality: N/A;   COLOSTOMY TAKEDOWN N/A 08/27/2016   Procedure: LAPAROSCOPIC COLOSTOMY REVERSAL;  Surgeon: Bernarda Ned, MD;  Location: [facility];  Service: General;  Laterality: N/A;   CORONARY ARTERY BYPASS GRAFT N/A 11/21/2018   Procedure: CORONARY ARTERY BYPASS GRAFTING (CABG) x Three , using left internal mammary artery and right leg greater saphenous vein harvested endoscopically;  Surgeon: Lucas Dorise POUR,  MD;  Location: [facility];  Service: Open Heart Surgery;  Laterality: N/A;   CORONARY STENT INTERVENTION N/A 05/30/2018   Procedure: CORONARY STENT INTERVENTION;  Surgeon: Court Dorn PARAS, MD;  Location: [facility];  Service: Cardiovascular;  Laterality: N/A;   CYSTOSCOPY WITH RETROGRADE PYELOGRAM, URETEROSCOPY AND STENT PLACEMENT Bilateral 06/21/2020   Procedure: CYSTOSCOPY WITH RETROGRADE PYELOGRAM, AND BILATERAL STENT PLACEMENT;  Surgeon: Alvaro Hummer, MD;  Location: [facility];  Service: Urology;  Laterality: Bilateral;   CYSTOSCOPY WITH RETROGRADE PYELOGRAM, URETEROSCOPY AND STENT PLACEMENT Bilateral 07/12/2020   Procedure: CYSTOSCOPY WITH RETROGRADE PYELOGRAM, URETEROSCOPY AND STENT PLACEMENT;   Surgeon: Cam Morene ORN, MD;  Location: [facility];  Service: Urology;  Laterality: Bilateral;  2 HRS   EYE SURGERY     eyelid tuck bilateral   HOLMIUM LASER APPLICATION Bilateral 07/12/2020   Procedure: HOLMIUM LASER APPLICATION;  Surgeon: Cam Morene ORN, MD;  Location: [facility];  Service: Urology;  Laterality: Bilateral;   LAPAROTOMY N/A 05/07/2016   Procedure: EXPLORATORY LAPAROTOMY, LYLSIS OF ADHSIONS, FLO OF PERITONEAL ABSCESS;  Surgeon: Bernarda Ned, MD;  Location: [facility];  Service: General;  Laterality: N/A;   LEFT HEART CATH AND CORONARY ANGIOGRAPHY N/A 05/30/2018   Procedure: LEFT HEART CATH AND CORONARY ANGIOGRAPHY;  Surgeon: Court Dorn PARAS, MD;  [facility]: [facility];  Service: Cardiovascular;  Laterality: N/A;   LEFT HEART CATH AND CORONARY ANGIOGRAPHY N/A 11/17/2018   Procedure: LEFT HEART CATH AND CORONARY ANGIOGRAPHY;  Surgeon: Court Dorn PARAS, MD;  [facility]: [facility];  Service: Cardiovascular;  Laterality: N/A;   LUMBAR LAMINECTOMY/DECOMPRESSION MICRODISCECTOMY Left 08/14/2024   Procedure: LUMBAR LAMINECTOMY/DECOMPRESSION MICRODISCECTOMY 1 LEVEL;  Surgeon: Joshua Alm Hamilton, MD;  Location: [facility] OR;  Service: Neurosurgery;  Laterality: Left;  Laminectomy and Foraminotomy - L4-L5 - left with sublaminar decompression   PARATHYROIDECTOMY     one lobe- benign per pt   TEE WITHOUT CARDIOVERSION N/A 11/21/2018   Procedure: TRANSESOPHAGEAL ECHOCARDIOGRAM (TEE);  Surgeon: Lucas Dorise POUR, MD;  Location: [facility] OR;  Service: Open Heart Surgery;  Laterality: N/A;   TONSILLECTOMY     TOTAL HIP ARTHROPLASTY  07/08/2011   Procedure: TOTAL HIP ARTHROPLASTY;  Surgeon: Dempsey GAILS Aluisio;  Location: [facility];  Service: Orthopedics;  Laterality: Right;   TOTAL KNEE ARTHROPLASTY Left 02/16/2022   Procedure: TOTAL KNEE ARTHROPLASTY;  Surgeon: Melodi Dempsey, MD;  Location: [facility];  Service: Orthopedics;  Laterality: Left;    Allergies[1]  Outpatient Encounter Medications  as of 08/24/2024  Medication Sig   albuterol  (VENTOLIN  HFA) 108 (90 Base) MCG/ACT inhaler Inhale 2 puffs into the lungs every 6 (six) hours as needed for wheezing or shortness of breath.   amLODipine  (NORVASC ) 5 MG tablet TAKE 1 TABLET BY MOUTH DAILY (Patient not taking: Reported on 08/17/2024)   atorvastatin  (LIPITOR ) 80 MG tablet Take 1 tablet (80 mg total) by mouth daily.   budesonide-formoterol  (SYMBICORT) 80-4.5 MCG/ACT inhaler Inhale 2 puffs into the lungs in the morning and at bedtime. Also uses as needed for emergency purposes   carvedilol  (COREG ) 12.5 MG tablet TAKE 1 TABLET BY MOUTH 2 TIMES A DAY   cetirizine (ZYRTEC) 10 MG tablet Take 10 mg by mouth in the morning.   chlorhexidine  (HIBICLENS ) 4 % external liquid Apply 15 mLs (1 Application total) topically as directed for 30 doses. Use as directed daily for 5 days every other week for 6 weeks.   DULoxetine  (CYMBALTA ) 30 MG capsule Take 30 mg by mouth at bedtime.  furosemide  (LASIX ) 20 MG tablet Take 1 tablet (20 mg total) by mouth as needed. (Patient taking differently: Take 20 mg by mouth daily as needed for fluid or edema.)   HYDROcodone -acetaminophen  (NORCO) 7.5-325 MG tablet Take 1 tablet by mouth every 6 (six) hours.   ipratropium (ATROVENT) 0.03 % nasal spray Place 2 sprays into both nostrils 3 (three) times daily as needed for rhinitis.   levothyroxine  (SYNTHROID ) 25 MCG tablet TAKE 1 TABLET BY MOUTH DAILY   lisinopril  (ZESTRIL ) 40 MG tablet Take 1 tablet (40 mg total) by mouth every evening.   Multiple Vitamins-Minerals (MULTIVITAMIN WOMEN 50+ PO) Take 2 tablets by mouth daily.   mupirocin  ointment (BACTROBAN ) 2 % Place 1 Application into the nose 2 (two) times daily for 60 doses. Use as directed 2 times daily for 5 days every other week for 6 weeks.   naproxen  sodium (ALEVE ) 220 MG tablet Take 220 mg by mouth daily as needed.   nitroGLYCERIN  (NITROSTAT ) 0.4 MG SL tablet Place 1 tablet (0.4 mg total) under the tongue as needed.    OVER THE COUNTER MEDICATION Apply 1 Application topically daily as needed (pain). Magna Sport Balm   pantoprazole  (PROTONIX ) 40 MG tablet Take 1 tablet (40 mg total) by mouth 2 (two) times daily before a meal.   pregabalin  (LYRICA ) 25 MG capsule Take 1 capsule (25 mg total) by mouth daily.   Simethicone  (PHAZYME ULTIMATE) 500 MG CAPS Take 500 mg by mouth daily as needed (bloating).   Soft Lens Products (BIOTRUE) SOLN Place 1 drop into both eyes as needed (dry eyes).   tirzepatide  (MOUNJARO ) 15 MG/0.5ML Pen Inject 15 mg into the skin once a week.   tiZANidine  (ZANAFLEX ) 4 MG tablet Take 1 tablet (4 mg total) by mouth every 8 (eight) hours as needed for muscle spasms.   No facility-administered encounter medications on file as of 08/24/2024.    Review of Systems:  Review of Systems  Constitutional:  Negative for activity change and appetite change.  HENT: Negative.    Respiratory:  Negative for cough and shortness of breath.   Cardiovascular:  Negative for leg swelling.  Gastrointestinal:  Negative for constipation.  Genitourinary:  Positive for dysuria.  Musculoskeletal:  Positive for gait problem. Negative for arthralgias and myalgias.  Skin: Negative.   Neurological:  Negative for dizziness and weakness.  Psychiatric/Behavioral:  Negative for confusion, dysphoric mood and sleep disturbance.     Health Maintenance  Topic Date Due   Zoster Vaccines- Shingrix (1 of 2) Never done   DTaP/Tdap/Td (2 - Td or Tdap) 02/22/2024   OPHTHALMOLOGY EXAM  05/03/2024   FOOT EXAM  06/16/2024   HEMOGLOBIN A1C  07/11/2024   Diabetic kidney evaluation - Urine ACR  08/09/2024   Medicare Annual Wellness (AWV)  11/10/2024   COVID-19 Vaccine (11 - Mixed Product risk 2025-26 season) 12/09/2024   Diabetic kidney evaluation - eGFR measurement  08/07/2025   Pneumococcal Vaccine: 50+ Years  Completed   Influenza Vaccine  Completed   Bone Density Scan  Completed   Meningococcal B Vaccine  Aged Out     Physical Exam: Vitals:   08/24/24 1826  BP: (!) 154/70  Pulse: 82  Resp: 18  Temp: 98.4 F (36.9 C)  Weight: 154 lb (69.9 kg)   Body mass index is 25.63 kg/m. Physical Exam Vitals reviewed.  Constitutional:      Appearance: Normal appearance.  HENT:     Head: Normocephalic.     Nose: Nose normal.  Mouth/Throat:     Mouth: Mucous membranes are moist.     Pharynx: Oropharynx is clear.  Eyes:     Pupils: Pupils are equal, round, and reactive to light.  Cardiovascular:     Rate and Rhythm: Normal rate and regular rhythm.     Pulses: Normal pulses.     Heart sounds: Normal heart sounds. No murmur heard. Pulmonary:     Effort: Pulmonary effort is normal.     Breath sounds: Normal breath sounds.  Abdominal:     General: Abdomen is flat. Bowel sounds are normal.     Palpations: Abdomen is soft.  Musculoskeletal:        General: No swelling.     Cervical back: Neck supple.  Skin:    General: Skin is warm.  Neurological:     General: No focal deficit present.     Mental Status: She is alert and oriented to person, place, and time.  Psychiatric:        Mood and Affect: Mood normal.        Thought Content: Thought content normal.     Labs reviewed: Basic Metabolic Panel: Recent Labs    09/16/23 1438 01/10/24 1114 01/10/24 1117 08/07/24 1525  NA 140  --  139 141  K 5.3*  --  4.6 4.2  CL 102  --  101 103  CO2 20  --  16* 28  GLUCOSE 141*  --  108* 94  BUN 34*  --  31* 24*  CREATININE 1.20*  --  0.99 0.81  CALCIUM  9.9  --  10.4* 9.5  TSH  --  2.210  --   --    Liver Function Tests: Recent Labs    07/05/24 0927  AST 16  ALT 13  ALKPHOS 73  BILITOT 1.2  PROT 5.9*  ALBUMIN  4.2   No results for input(s): LIPASE, AMYLASE in the last 8760 hours. No results for input(s): AMMONIA in the last 8760 hours. CBC: Recent Labs    08/07/24 1525  WBC 8.7  HGB 13.4  HCT 40.6  MCV 90.6  PLT 227   Lipid Panel: Recent Labs    07/05/24 0927  CHOL  103  HDL 48  LDLCALC 38  TRIG 83  CHOLHDL 2.1   Lab Results  Component Value Date   HGBA1C 5.7 (H) 01/10/2024    Procedures since last visit: DG Lumbar Spine 2-3 Views Result Date: 08/14/2024 EXAM: 2 VIEW(S) XRAY OF THE LUMBAR SPINE 08/14/2024 11:20:00 AM COMPARISON: 05/02/2024 CLINICAL HISTORY: L4-5 laminectomy FINDINGS: LUMBAR SPINE: BONES: Vertebral body heights are maintained. Persistent anterolisthesis of L4 on L5 and L5 on S1 is noted. The initial film shows a needle in the posterior soft tissues at the L4-L5 level. Subsequent images show surgical retractors and instruments at the L4-L5 disc space. DISCS AND DEGENERATIVE CHANGES: Disc space narrowing is noted at L4-5 and L5-S1. SOFT TISSUES: IMPRESSION: 1. Intraoperative localization at the L4-5 level. 2. Persistent anterolisthesis of L4 on L5 and L5 on S1. Electronically signed by: Oneil Devonshire MD MD 08/14/2024 11:34 PM EST RP Workstation: [id number]    Assessment/Plan 1. S/P lumbar laminectomy (Primary) Doing well Has some restrictions She has appointment with Dr. Joshua next week She has done home visit with therapy and had needs some modification Plan to stay in respite care all those modifications are done.  Patient is medically okay for discharge  2. Postural hypotension Patient has not had any episodes of low blood pressures Her blood  pressures are running slightly on the higher side Her amlodipine  can be started once she gets home  3. Essential hypertension Right now she is on Coreg  and lisinopril  at night  4. Gastroesophageal reflux disease, unspecified whether esophagitis present On Protonix   5. Dysuria Ordered UA 6 Neuropathy Lyrica  and Cymbalta  Would need taper as Outpatient as her symptoms are much improved since surgery   7 Dyslipidemia, goal LDL below 70 On statin    Labs/tests ordered:  * No order type specified * Next appt:  09/21/2024     [1]  Allergies Allergen Reactions   Morphine  Other  (See Comments)    Medication did not work for pain    Penicillin G Other (See Comments)    penicillin G   Clindamycin /Lincomycin    Penicillins Other (See Comments)    UNSPECIFIED REACTION  Unknown reaction as a child.  Tolerated Zosyn  2017. Tolerated Cephalosporin Date: 02/17/22.     Sulfamethoxazole-Trimethoprim    "

## 2024-08-29 ENCOUNTER — Non-Acute Institutional Stay (SKILLED_NURSING_FACILITY): Payer: Self-pay | Admitting: Nurse Practitioner

## 2024-08-29 ENCOUNTER — Encounter: Payer: Self-pay | Admitting: Nurse Practitioner

## 2024-08-29 DIAGNOSIS — E039 Hypothyroidism, unspecified: Secondary | ICD-10-CM | POA: Diagnosis not present

## 2024-08-29 DIAGNOSIS — E1159 Type 2 diabetes mellitus with other circulatory complications: Secondary | ICD-10-CM

## 2024-08-29 DIAGNOSIS — I10 Essential (primary) hypertension: Secondary | ICD-10-CM

## 2024-08-29 DIAGNOSIS — Z7985 Long-term (current) use of injectable non-insulin antidiabetic drugs: Secondary | ICD-10-CM | POA: Diagnosis not present

## 2024-08-29 DIAGNOSIS — Z9861 Coronary angioplasty status: Secondary | ICD-10-CM

## 2024-08-29 DIAGNOSIS — M150 Primary generalized (osteo)arthritis: Secondary | ICD-10-CM

## 2024-08-29 DIAGNOSIS — R300 Dysuria: Secondary | ICD-10-CM | POA: Diagnosis not present

## 2024-08-29 DIAGNOSIS — I2510 Atherosclerotic heart disease of native coronary artery without angina pectoris: Secondary | ICD-10-CM

## 2024-08-29 NOTE — Assessment & Plan Note (Signed)
 c/o low Bp measurements, down as low as 80/40s when she was out of facility.  Fatigue was associated with the events of low Bps Will decrease Lisinopril  10mg /20mg  every day, HR 88bpm upon my examination, continue Carvedilol  for now Bp daily.  08/30/24 Bp 142/80

## 2024-08-29 NOTE — Assessment & Plan Note (Signed)
 S/p lumbar laminectomy, f/u surgeon, residual numbness in toes, improved overall, on  Lyrica , prn Norco

## 2024-08-29 NOTE — Assessment & Plan Note (Signed)
"   08/10/23 ACR 11, Hgb A1c 5.7 01/10/24, lost #40Ibs on Mounjaro  "

## 2024-08-29 NOTE — Progress Notes (Unsigned)
 " Location:   [facility]   Place of Service:  SNF (31) Provider: Larwance Garland Hincapie NP  Mark Benecke X, NP  Patient Care Team: Caetano Oberhaus X, NP as PCP - General (Internal Medicine) Court Dorn PARAS, MD as PCP - Cardiology (Cardiology) Elspeth Lauraine DEL, OD as Referring Physician  Extended Emergency Contact Information Primary Emergency Contact: Vicci Glendia [place], [place] [address] [place] Home Phone: [phone] Mobile Phone: [phone] Relation: Son Secondary Emergency Contact: Arloa Veva [place], [place] [address] [place] Home Phone: [phone] Mobile Phone: [phone] Relation: Daughter  Code Status:  DNR Goals of care: Advanced Directive information    08/07/2024    2:50 PM  Advanced Directives  Does Patient Have a Medical Advance Directive? Yes  Type of Estate Agent of [place];Living will  Copy of Healthcare Power of Attorney in Chart? --      Significant value     Chief Complaint  Patient presents with   Urinary Tract Infection    UTI    HPI:  Pt is a 84 y.o. female seen today for an acute visit for c/o low Bp measurements, down as low as 80/40s when she was out of facility.   Also c/o dysuria, UA: negative nitrite, none bacteria and yeast, none wbc. Denied urinary urgency, lower abd discomfort.   The patient afebrile, in her usual state of health otherwise.   S/p lumbar laminectomy, f/u surgeon, residual numbness in toes, improved overall, on  Lyrica , prn Norco   L shoulder fx 03/2024, healed.               Hypothyroidism, on Levothyroxine , TSH 5.7 01/09/25             02/12/22 CT abd Cholelithiasis with mild gallbladder wall thickening, LFT wnl 01/12/23. C/o RLQ pain resolves after BM, negative Murphy's sign, denied abd pain associated with nausea, vomiting, she is afebrile.              HTN, off Amlodipine , on Carvedilol , Lisinopril . Bun/creat 24/0.81 08/07/24             T2DM, 08/10/23 ACR 11, Hgb A1c  5.7 01/10/24, lost #40Ibs on Mounjaro              HLD, taking Atorvastatin , LDL 38 07/05/24             Neuropathy, on Lyrica , Duloxetine .              Hx of UTI, s/p kidney stones surgical removal.              OA, s/p L knee replacement, R hip, L shoulder pain-limited ROM, s/p Ortho, s/p lumbar laminectomy.              Insomnia, hx of              CAD s/p CABG x3 11/21/18, (prior stent drug eluting), hx of tachycardia, on oral nitrate, Plavix , Carvedilol , Lisinopril , followed by cardiology. TSH 2.2 01/10/24             GERD, on  Pantoprazole , Hgb 13.4 08/07/24             Asthma,  Symbicort, Atrovent nasal spray, dyspnea.                    Past Medical History:  Diagnosis Date   Anemia  mild as per PCP   Anginal pain    06/20/20, relieved , no current problems   Anxiety    Arthritis    hip/ s/p recent shoulder fracture 8/12- RIGHT   Asthma    Coronary artery disease 05/30/2018   LAD PCI/DES   Depression    Diabetes mellitus without complication (HCC)    type 2, does not check blood sugar, tx Mounjaro    GI bleeding    Hiatal hernia    no current problems   History of kidney stones    surgery to remove   Humerus fracture    with wrist on right side   Hypertension    hypercholesterolemia/  EKG on chart with clearance and note 06/18/11  Mazzocchi   Hypothyroidism    Pneumonia    x several   Pre-diabetes    hx   Past Surgical History:  Procedure Laterality Date   ABDOMINAL HYSTERECTOMY     cataract eye surgery     removed cataracts   COLECTOMY WITH COLOSTOMY CREATION/HARTMANN PROCEDURE N/A 05/07/2016   Procedure: COLOSTOMY CREATION/HARTMANN PROCEDURE;  Surgeon: Bernarda Ned, MD;  Location: [facility];  Service: General;  Laterality: N/A;   COLONOSCOPY N/A 08/26/2016   Procedure: COLONOSCOPY;  Surgeon: Bernarda Ned, MD;  Location: [facility];  Service: Endoscopy;  Laterality: N/A;   COLOSTOMY TAKEDOWN N/A 08/27/2016   Procedure: LAPAROSCOPIC  COLOSTOMY REVERSAL;  Surgeon: Bernarda Ned, MD;  Location: [facility];  Service: General;  Laterality: N/A;   CORONARY ARTERY BYPASS GRAFT N/A 11/21/2018   Procedure: CORONARY ARTERY BYPASS GRAFTING (CABG) x Three , using left internal mammary artery and right leg greater saphenous vein harvested endoscopically;  Surgeon: Lucas Dorise POUR, MD;  Location: [facility];  Service: Open Heart Surgery;  Laterality: N/A;   CORONARY STENT INTERVENTION N/A 05/30/2018   Procedure: CORONARY STENT INTERVENTION;  Surgeon: Court Dorn PARAS, MD;  Location: [facility];  Service: Cardiovascular;  Laterality: N/A;   CYSTOSCOPY WITH RETROGRADE PYELOGRAM, URETEROSCOPY AND STENT PLACEMENT Bilateral 06/21/2020   Procedure: CYSTOSCOPY WITH RETROGRADE PYELOGRAM, AND BILATERAL STENT PLACEMENT;  Surgeon: Alvaro Hummer, MD;  Location: [facility];  Service: Urology;  Laterality: Bilateral;   CYSTOSCOPY WITH RETROGRADE PYELOGRAM, URETEROSCOPY AND STENT PLACEMENT Bilateral 07/12/2020   Procedure: CYSTOSCOPY WITH RETROGRADE PYELOGRAM, URETEROSCOPY AND STENT PLACEMENT;  Surgeon: Cam Morene ORN, MD;  Location: [facility];  Service: Urology;  Laterality: Bilateral;  2 HRS   EYE SURGERY     eyelid tuck bilateral   HOLMIUM LASER APPLICATION Bilateral 07/12/2020   Procedure: HOLMIUM LASER APPLICATION;  Surgeon: Cam Morene ORN, MD;  Location: [facility];  Service: Urology;  Laterality: Bilateral;   LAPAROTOMY N/A 05/07/2016   Procedure: EXPLORATORY LAPAROTOMY, LYLSIS OF ADHSIONS, FLO OF PERITONEAL ABSCESS;  Surgeon: Bernarda Ned, MD;  Location: [facility];  Service: General;  Laterality: N/A;   LEFT HEART CATH AND CORONARY ANGIOGRAPHY N/A 05/30/2018   Procedure: LEFT HEART CATH AND CORONARY ANGIOGRAPHY;  Surgeon: Court Dorn PARAS, MD;  [facility];  Service: Cardiovascular;  Laterality: N/A;   LEFT HEART CATH AND CORONARY ANGIOGRAPHY N/A 11/17/2018   Procedure: LEFT HEART CATH AND CORONARY ANGIOGRAPHY;   Surgeon: Court Dorn PARAS, MD;  [facility];  Service: Cardiovascular;  Laterality: N/A;   LUMBAR LAMINECTOMY/DECOMPRESSION MICRODISCECTOMY Left 08/14/2024   Procedure: LUMBAR LAMINECTOMY/DECOMPRESSION MICRODISCECTOMY 1 LEVEL;  Surgeon: Joshua Alm Hamilton, MD;  Location[facility];  Service: Neurosurgery;  Laterality: Left;  Laminectomy and Foraminotomy - L4-L5 -  left with sublaminar decompression   PARATHYROIDECTOMY     one lobe- benign per pt   TEE WITHOUT CARDIOVERSION N/A 11/21/2018   Procedure: TRANSESOPHAGEAL ECHOCARDIOGRAM (TEE);  Surgeon: Lucas Dorise POUR, MD;  Location[facility];  Service: Open Heart Surgery;  Laterality: N/A;   TONSILLECTOMY     TOTAL HIP ARTHROPLASTY  07/08/2011   Procedure: TOTAL HIP ARTHROPLASTY;  Surgeon: Dempsey GAILS Aluisio;  Location: [facility];  Service: Orthopedics;  Laterality: Right;   TOTAL KNEE ARTHROPLASTY Left 02/16/2022   Procedure: TOTAL KNEE ARTHROPLASTY;  Surgeon: Melodi Dempsey, MD;  Location: [facility];  Service: Orthopedics;  Laterality: Left;    Allergies[1]  Allergies as of 08/29/2024       Reactions   Morphine  Other (See Comments)   Medication did not work for pain    Penicillin G Other (See Comments)   penicillin G   Clindamycin /lincomycin    Penicillins Other (See Comments)   UNSPECIFIED REACTION  Unknown reaction as a child.  Tolerated Zosyn  2017. Tolerated Cephalosporin Date: 02/17/22.   Sulfamethoxazole-trimethoprim         Medication List        Accurate as of August 29, 2024  3:59 PM. If you have any questions, ask your nurse or doctor.          albuterol  108 (90 Base) MCG/ACT inhaler Commonly known as: VENTOLIN  HFA Inhale 2 puffs into the lungs every 6 (six) hours as needed for wheezing or shortness of breath.   amLODipine  5 MG tablet Commonly known as: NORVASC  TAKE 1 TABLET BY MOUTH DAILY   atorvastatin  80 MG tablet Commonly known as: LIPITOR  Take 1 tablet (80 mg total) by mouth daily.   Biotrue  Soln Place 1 drop into both eyes as needed (dry eyes).   budesonide-formoterol  80-4.5 MCG/ACT inhaler Commonly known as: SYMBICORT Inhale 2 puffs into the lungs in the morning and at bedtime. Also uses as needed for emergency purposes   carvedilol  12.5 MG tablet Commonly known as: COREG  TAKE 1 TABLET BY MOUTH 2 TIMES A DAY   cetirizine 10 MG tablet Commonly known as: ZYRTEC Take 10 mg by mouth in the morning.   chlorhexidine  4 % external liquid Commonly known as: HIBICLENS  Apply 15 mLs (1 Application total) topically as directed for 30 doses. Use as directed daily for 5 days every other week for 6 weeks.   DULoxetine  30 MG capsule Commonly known as: CYMBALTA  Take 30 mg by mouth at bedtime.   furosemide  20 MG tablet Commonly known as: LASIX  Take 1 tablet (20 mg total) by mouth as needed. What changed:  when to take this reasons to take this   HYDROcodone -acetaminophen  7.5-325 MG tablet Commonly known as: NORCO Take 1 tablet by mouth every 8 (eight) hours as needed for moderate pain (pain score 4-6).   ipratropium 0.03 % nasal spray Commonly known as: ATROVENT Place 2 sprays into both nostrils 3 (three) times daily as needed for rhinitis.   levothyroxine  25 MCG tablet Commonly known as: SYNTHROID  TAKE 1 TABLET BY MOUTH DAILY   lisinopril  40 MG tablet Commonly known as: ZESTRIL  Take 1 tablet (40 mg total) by mouth every evening.   Mounjaro  15 MG/0.5ML Pen Generic drug: tirzepatide  Inject 15 mg into the skin once a week.   MULTIVITAMIN WOMEN 50+ PO Take 2 tablets by mouth daily.   mupirocin  ointment 2 % Commonly known as: BACTROBAN  Place 1 Application into the nose 2 (two) times daily for 60 doses. Use as directed 2  times daily for 5 days every other week for 6 weeks.   naproxen  sodium 220 MG tablet Commonly known as: ALEVE  Take 220 mg by mouth daily as needed.   nitroGLYCERIN  0.4 MG SL tablet Commonly known as: NITROSTAT  Place 1 tablet (0.4 mg total) under  the tongue as needed.   OVER THE COUNTER MEDICATION Apply 1 Application topically daily as needed (pain). Magna Sport Balm   pantoprazole  40 MG tablet Commonly known as: PROTONIX  Take 1 tablet (40 mg total) by mouth 2 (two) times daily before a meal.   Phazyme Ultimate 500 MG Caps Generic drug: Simethicone  Take 500 mg by mouth daily as needed (bloating).   pregabalin  25 MG capsule Commonly known as: LYRICA  Take 1 capsule (25 mg total) by mouth daily.   tiZANidine  4 MG tablet Commonly known as: Zanaflex  Take 1 tablet (4 mg total) by mouth every 8 (eight) hours as needed for muscle spasms.        Review of Systems  Constitutional:  Negative for appetite change and fatigue.  HENT:  Negative for congestion, sore throat and trouble swallowing.   Eyes:  Negative for visual disturbance.  Respiratory:  Negative for chest tightness and shortness of breath.        DOE occasionally.   Cardiovascular:  Negative for chest pain, palpitations and leg swelling.  Gastrointestinal:  Negative for abdominal pain and constipation.       Hx of colon abscess  Genitourinary:  Positive for dysuria. Negative for frequency and urgency.       Bathroom trip x1/night, s/p hysterectomy, hx of kidney stones.   Musculoskeletal:  Positive for arthralgias and back pain. Negative for gait problem.       Left knee pain, ortho scar tissue, resolved Spinal stenosis, 3 inj, pending surgery, numbness in legs, no incontinent of B+B S/p spinal laminectomy.   Skin:  Negative for color change.  Neurological:  Positive for numbness. Negative for speech difficulty, weakness and headaches.       Tingling, numbness only in toes now.   Psychiatric/Behavioral:  Negative for behavioral problems and sleep disturbance. The patient is not nervous/anxious.     Immunization History  Administered Date(s) Administered   INFLUENZA, HIGH DOSE SEASONAL PF 10/21/2017, 09/19/2019, 09/17/2021, 06/11/2024   Influenza,inj,Quad  PF,6+ Mos 08/17/2023   Influenza-Unspecified 05/03/2023   PFIZER(Purple Top)SARS-COV-2 Vaccination 08/22/2019, 09/11/2019, 09/29/2019, 04/29/2020, 05/13/2020   PNEUMOCOCCAL CONJUGATE-20 12/29/2022   Pfizer Covid-19 Vaccine Bivalent Booster 94yrs & up 07/09/2021, 05/03/2023   Pfizer(Comirnaty)Fall Seasonal Vaccine 12 years and older 06/11/2024   Pneumococcal Conjugate PCV 7 08/03/2004   Pneumococcal Polysaccharide-23 10/21/2017, 05/31/2018, 08/11/2018, 09/19/2019, 04/29/2020, 12/04/2020, 09/17/2021, 08/17/2023   RSV,unspecified 05/03/2023   Tdap 02/21/2014   Unspecified SARS-COV-2 Vaccination 09/08/2019, 09/29/2019   Pertinent  Health Maintenance Due  Topic Date Due   OPHTHALMOLOGY EXAM  05/03/2024   FOOT EXAM  06/16/2024   HEMOGLOBIN A1C  07/11/2024   Influenza Vaccine  Completed   Bone Density Scan  Completed      08/12/2023    3:37 PM 09/03/2023    2:57 PM 09/10/2023   11:00 AM 11/11/2023    2:19 PM 06/15/2024    2:58 PM  Fall Risk  Falls in the past year? 0 0 0 0 0  Was there an injury with Fall? 0  0  0  0  0   Fall Risk Category Calculator 0 0 0 0 0  Patient at Risk for Falls Due to  No Fall Risks No  Fall Risks No Fall Risks No Fall Risks  Fall risk Follow up  Falls evaluation completed Falls evaluation completed;Education provided;Falls prevention discussed Falls evaluation completed Falls evaluation completed     Data saved with a previous flowsheet row definition   Functional Status Survey:    Vitals:   08/29/24 1551  BP: (!) 154/70  Pulse: 82  Resp: 18  Temp: 98.4 F (36.9 C)  SpO2: 96%  Weight: 154 lb 1.6 oz (69.9 kg)  Height: 5' 5 (1.651 m)   Body mass index is 25.64 kg/m. Physical Exam Constitutional:      Appearance: Normal appearance.  HENT:     Head: Normocephalic and atraumatic.     Nose: Nose normal.     Mouth/Throat:     Mouth: Mucous membranes are moist.  Eyes:     Conjunctiva/sclera: Conjunctivae normal.  Cardiovascular:      Rate and Rhythm: Normal rate and regular rhythm.  Pulmonary:     Effort: Pulmonary effort is normal.     Breath sounds: No wheezing.  Abdominal:     General: Bowel sounds are normal.     Palpations: Abdomen is soft.     Tenderness: There is no abdominal tenderness. There is no right CVA tenderness, left CVA tenderness or guarding.  Musculoskeletal:        General: Normal range of motion.     Cervical back: Normal range of motion.     Right lower leg: No edema.     Left lower leg: No edema.     Comments: Trace edema Limited L shoulder ROM  Skin:    General: Skin is warm and dry.  Neurological:     General: No focal deficit present.     Mental Status: She is alert and oriented to person, place, and time.  Psychiatric:        Mood and Affect: Mood normal.        Behavior: Behavior normal.        Thought Content: Thought content normal.        Judgment: Judgment normal.     Labs reviewed: Recent Labs    09/16/23 1438 01/10/24 1117 08/07/24 1525  NA 140 139 141  K 5.3* 4.6 4.2  CL 102 101 103  CO2 20 16* 28  GLUCOSE 141* 108* 94  BUN 34* 31* 24*  CREATININE 1.20* 0.99 0.81  CALCIUM  9.9 10.4* 9.5   Recent Labs    07/05/24 0927  AST 16  ALT 13  ALKPHOS 73  BILITOT 1.2  PROT 5.9*  ALBUMIN  4.2   Recent Labs    08/07/24 1525  WBC 8.7  HGB 13.4  HCT 40.6  MCV 90.6  PLT 227   Lab Results  Component Value Date   TSH 2.210 01/10/2024   Lab Results  Component Value Date   HGBA1C 5.7 (H) 01/10/2024   Lab Results  Component Value Date   CHOL 103 07/05/2024   HDL 48 07/05/2024   LDLCALC 38 07/05/2024   TRIG 83 07/05/2024   CHOLHDL 2.1 07/05/2024    Significant Diagnostic Results in last 30 days:  DG Lumbar Spine 2-3 Views Result Date: 08/14/2024 EXAM: 2 VIEW(S) XRAY OF THE LUMBAR SPINE 08/14/2024 11:20:00 AM COMPARISON: 05/02/2024 CLINICAL HISTORY: L4-5 laminectomy FINDINGS: LUMBAR SPINE: BONES: Vertebral body heights are maintained. Persistent  anterolisthesis of L4 on L5 and L5 on S1 is noted. The initial film shows a needle in the posterior soft tissues at the L4-L5 level. Subsequent images  show surgical retractors and instruments at the L4-L5 disc space. DISCS AND DEGENERATIVE CHANGES: Disc space narrowing is noted at L4-5 and L5-S1. SOFT TISSUES: IMPRESSION: 1. Intraoperative localization at the L4-5 level. 2. Persistent anterolisthesis of L4 on L5 and L5 on S1. Electronically signed by: Oneil Devonshire MD MD 08/14/2024 11:34 PM EST RP Workstation: [id number]    Assessment/Plan Dysuria Also c/o dysuria, UA: negative nitrite, none bacteria and yeast, none wbc. Denied urinary urgency, lower abd discomfort.   The patient afebrile, in her usual state of health otherwis  Pyridium  100mg  tid po x 2 days, pending urine culture.   Essential hypertension c/o low Bp measurements, down as low as 80/40s when she was out of facility.  Fatigue was associated with the events of low Bps Will decrease Lisinopril  10mg /20mg  every day, HR 88bpm upon my examination, continue Carvedilol  for now Bp daily.   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 TSH 2.2 01/10/24    Family/ staff Communication: plan of care reviewed with the patient and charge nurse.   Labs/tests ordered:  CBC/diff, CMP/eGFR, TSH, Hgb A1c, Vit B12, Vit D        [1] Allergies Allergen Reactions   Morphine  Other (See Comments)    Medication did not work for pain    Penicillin G Other (See Comments)    penicillin G    Clindamycin /Lincomycin    Penicillins Other (See Comments)    UNSPECIFIED REACTION  Unknown reaction as a child.  Tolerated Zosyn  2017. Tolerated Cephalosporin Date: 02/17/22.     Sulfamethoxazole-Trimethoprim   "

## 2024-08-29 NOTE — Assessment & Plan Note (Signed)
 Also c/o dysuria, UA: negative nitrite, none bacteria and yeast, none wbc. Denied urinary urgency, lower abd discomfort.   The patient afebrile, in her usual state of health otherwis  Pyridium  100mg  tid po x 2 days, pending urine culture.

## 2024-08-29 NOTE — Assessment & Plan Note (Signed)
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 TSH 2.2 01/10/24 "

## 2024-08-29 NOTE — Assessment & Plan Note (Signed)
 on Levothyroxine , TSH 5.7 01/09/25

## 2024-08-30 ENCOUNTER — Other Ambulatory Visit: Payer: Self-pay | Admitting: Nurse Practitioner

## 2024-08-30 ENCOUNTER — Encounter: Payer: Self-pay | Admitting: Nurse Practitioner

## 2024-08-30 ENCOUNTER — Telehealth: Payer: Self-pay

## 2024-08-30 MED ORDER — LISINOPRIL 10 MG PO TABS: 10.0000 mg | ORAL_TABLET | Freq: Every day | ORAL | 3 refills | Status: DC

## 2024-08-30 MED ORDER — LISINOPRIL 10 MG PO TABS: 10.0000 mg | ORAL_TABLET | Freq: Every day | ORAL | 3 refills | Status: AC

## 2024-08-30 NOTE — Telephone Encounter (Signed)
 This patient is in the skilled nursing section at [facility], all questions or concerns need to be addressed by the facility CNA's, Nurses, and staff.  ManX please address patients concerns with the facility staff

## 2024-08-30 NOTE — Telephone Encounter (Signed)
 Copied from CRM [id number]. Topic: Clinical - Prescription Issue >> Aug 30, 2024  1:24 PM Chiquita SQUIBB wrote: Reason for CRM: Patient is calling in stating that the lisinopril  (ZESTRIL ) 10 MG tablet  was sent to [facility] [place], [place]. Patient is unsure why the medication was sent to this pharmacy because she would be unable to get it. Please advise the patient.   Patient also stated Dr. Charlanne discussed changing a medication at her visit on the 22nd and she has not heard anything or received the medication for that as well.

## 2024-08-31 ENCOUNTER — Encounter: Payer: Self-pay | Admitting: Internal Medicine

## 2024-08-31 NOTE — Progress Notes (Signed)
 Urine Culture is positive for E Coli more then 100 k As she is stable with no fever or any other symptoms except Dysuria Will start her on Macrodantin 100 mg BID for 7 days

## 2024-09-21 ENCOUNTER — Encounter: Admitting: Nurse Practitioner
